# Patient Record
Sex: Male | Born: 1955
Health system: Southern US, Community
[De-identification: ages and names within clinical notes are randomized; demographics above are authoritative.]

## PROBLEM LIST (undated history)

## (undated) DIAGNOSIS — I639 Cerebral infarction, unspecified: Secondary | ICD-10-CM

## (undated) DIAGNOSIS — R569 Unspecified convulsions: Secondary | ICD-10-CM

## (undated) DIAGNOSIS — Z531 Procedure and treatment not carried out because of patient's decision for reasons of belief and group pressure: Secondary | ICD-10-CM

## (undated) DIAGNOSIS — I509 Heart failure, unspecified: Secondary | ICD-10-CM

## (undated) DIAGNOSIS — K219 Gastro-esophageal reflux disease without esophagitis: Secondary | ICD-10-CM

## (undated) DIAGNOSIS — IMO0001 Reserved for inherently not codable concepts without codable children: Secondary | ICD-10-CM

## (undated) DIAGNOSIS — E785 Hyperlipidemia, unspecified: Secondary | ICD-10-CM

## (undated) DIAGNOSIS — N179 Acute kidney failure, unspecified: Secondary | ICD-10-CM

## (undated) DIAGNOSIS — I219 Acute myocardial infarction, unspecified: Secondary | ICD-10-CM

## (undated) DIAGNOSIS — I2510 Atherosclerotic heart disease of native coronary artery without angina pectoris: Secondary | ICD-10-CM

## (undated) DIAGNOSIS — I10 Essential (primary) hypertension: Secondary | ICD-10-CM

## (undated) DIAGNOSIS — E119 Type 2 diabetes mellitus without complications: Secondary | ICD-10-CM

---

## 2005-01-12 ENCOUNTER — Ambulatory Visit: Payer: Self-pay | Admitting: Internal Medicine

## 2005-01-19 ENCOUNTER — Ambulatory Visit: Payer: Self-pay | Admitting: Internal Medicine

## 2005-12-20 ENCOUNTER — Ambulatory Visit: Payer: Self-pay | Admitting: Internal Medicine

## 2015-01-22 ENCOUNTER — Emergency Department (HOSPITAL_COMMUNITY): Payer: Medicaid Other

## 2015-01-22 ENCOUNTER — Inpatient Hospital Stay (HOSPITAL_COMMUNITY)
Admission: EM | Admit: 2015-01-22 | Discharge: 2015-02-01 | DRG: 981 | Disposition: A | Discharge status: Rehabilitation Facility | Payer: Medicaid Other | Attending: Internal Medicine | Admitting: Internal Medicine

## 2015-01-22 DIAGNOSIS — I10 Essential (primary) hypertension: Secondary | ICD-10-CM | POA: Diagnosis present

## 2015-01-22 DIAGNOSIS — E1165 Type 2 diabetes mellitus with hyperglycemia: Secondary | ICD-10-CM | POA: Diagnosis present

## 2015-01-22 DIAGNOSIS — F10239 Alcohol dependence with withdrawal, unspecified: Secondary | ICD-10-CM | POA: Diagnosis present

## 2015-01-22 DIAGNOSIS — R7989 Other specified abnormal findings of blood chemistry: Secondary | ICD-10-CM

## 2015-01-22 DIAGNOSIS — I252 Old myocardial infarction: Secondary | ICD-10-CM | POA: Diagnosis present

## 2015-01-22 DIAGNOSIS — I4892 Unspecified atrial flutter: Secondary | ICD-10-CM | POA: Diagnosis present

## 2015-01-22 DIAGNOSIS — R9431 Abnormal electrocardiogram [ECG] [EKG]: Secondary | ICD-10-CM

## 2015-01-22 DIAGNOSIS — R1310 Dysphagia, unspecified: Secondary | ICD-10-CM | POA: Diagnosis present

## 2015-01-22 DIAGNOSIS — I639 Cerebral infarction, unspecified: Secondary | ICD-10-CM

## 2015-01-22 DIAGNOSIS — J9600 Acute respiratory failure, unspecified whether with hypoxia or hypercapnia: Secondary | ICD-10-CM | POA: Diagnosis present

## 2015-01-22 DIAGNOSIS — R569 Unspecified convulsions: Secondary | ICD-10-CM | POA: Diagnosis present

## 2015-01-22 DIAGNOSIS — R778 Other specified abnormalities of plasma proteins: Secondary | ICD-10-CM

## 2015-01-22 DIAGNOSIS — I6340 Cerebral infarction due to embolism of unspecified cerebral artery: Principal | ICD-10-CM | POA: Diagnosis present

## 2015-01-22 DIAGNOSIS — E872 Acidosis, unspecified: Secondary | ICD-10-CM

## 2015-01-22 DIAGNOSIS — I2109 ST elevation (STEMI) myocardial infarction involving other coronary artery of anterior wall: Secondary | ICD-10-CM | POA: Diagnosis present

## 2015-01-22 DIAGNOSIS — R4182 Altered mental status, unspecified: Secondary | ICD-10-CM | POA: Diagnosis present

## 2015-01-22 DIAGNOSIS — E876 Hypokalemia: Secondary | ICD-10-CM | POA: Diagnosis present

## 2015-01-22 DIAGNOSIS — Z87891 Personal history of nicotine dependence: Secondary | ICD-10-CM

## 2015-01-22 DIAGNOSIS — I161 Hypertensive emergency: Secondary | ICD-10-CM | POA: Diagnosis present

## 2015-01-22 DIAGNOSIS — I214 Non-ST elevation (NSTEMI) myocardial infarction: Secondary | ICD-10-CM | POA: Diagnosis present

## 2015-01-22 DIAGNOSIS — E1142 Type 2 diabetes mellitus with diabetic polyneuropathy: Secondary | ICD-10-CM | POA: Diagnosis present

## 2015-01-22 DIAGNOSIS — I2510 Atherosclerotic heart disease of native coronary artery without angina pectoris: Secondary | ICD-10-CM | POA: Diagnosis present

## 2015-01-22 DIAGNOSIS — I219 Acute myocardial infarction, unspecified: Secondary | ICD-10-CM

## 2015-01-22 DIAGNOSIS — G9341 Metabolic encephalopathy: Secondary | ICD-10-CM | POA: Diagnosis present

## 2015-01-22 DIAGNOSIS — Z9289 Personal history of other medical treatment: Secondary | ICD-10-CM

## 2015-01-22 DIAGNOSIS — J9601 Acute respiratory failure with hypoxia: Secondary | ICD-10-CM | POA: Diagnosis present

## 2015-01-22 DIAGNOSIS — I2102 ST elevation (STEMI) myocardial infarction involving left anterior descending coronary artery: Secondary | ICD-10-CM | POA: Diagnosis present

## 2015-01-22 DIAGNOSIS — Z452 Encounter for adjustment and management of vascular access device: Secondary | ICD-10-CM

## 2015-01-22 DIAGNOSIS — J9690 Respiratory failure, unspecified, unspecified whether with hypoxia or hypercapnia: Secondary | ICD-10-CM

## 2015-01-22 DIAGNOSIS — I5043 Acute on chronic combined systolic (congestive) and diastolic (congestive) heart failure: Secondary | ICD-10-CM | POA: Diagnosis present

## 2015-01-22 DIAGNOSIS — Z4659 Encounter for fitting and adjustment of other gastrointestinal appliance and device: Secondary | ICD-10-CM

## 2015-01-22 DIAGNOSIS — N179 Acute kidney failure, unspecified: Secondary | ICD-10-CM | POA: Diagnosis present

## 2015-01-22 DIAGNOSIS — I272 Other secondary pulmonary hypertension: Secondary | ICD-10-CM | POA: Diagnosis present

## 2015-01-22 DIAGNOSIS — I5040 Unspecified combined systolic (congestive) and diastolic (congestive) heart failure: Secondary | ICD-10-CM | POA: Diagnosis present

## 2015-01-22 DIAGNOSIS — R739 Hyperglycemia, unspecified: Secondary | ICD-10-CM | POA: Diagnosis present

## 2015-01-22 DIAGNOSIS — E785 Hyperlipidemia, unspecified: Secondary | ICD-10-CM | POA: Diagnosis present

## 2015-01-22 HISTORY — DX: Cerebral infarction, unspecified: I63.9

## 2015-01-22 HISTORY — DX: Essential (primary) hypertension: I10

## 2015-01-22 HISTORY — DX: Atherosclerotic heart disease of native coronary artery without angina pectoris: I25.10

## 2015-01-22 HISTORY — DX: Acute myocardial infarction, unspecified: I21.9

## 2015-01-22 HISTORY — DX: Unspecified convulsions: R56.9

## 2015-01-22 LAB — CBC WITH DIFFERENTIAL/PLATELET
BASOS ABS: 0 10*3/uL (ref 0.0–0.1)
Basophils Relative: 0 % (ref 0–1)
Eosinophils Absolute: 0.4 10*3/uL (ref 0.0–0.7)
Eosinophils Relative: 2 % (ref 0–5)
HCT: 39.5 % (ref 39.0–52.0)
Hemoglobin: 14.2 g/dL (ref 13.0–17.0)
LYMPHS PCT: 20 % (ref 12–46)
Lymphs Abs: 3 10*3/uL (ref 0.7–4.0)
MCH: 31.1 pg (ref 26.0–34.0)
MCHC: 35.9 g/dL (ref 30.0–36.0)
MCV: 86.4 fL (ref 78.0–100.0)
MONO ABS: 0.8 10*3/uL (ref 0.1–1.0)
Monocytes Relative: 5 % (ref 3–12)
Neutro Abs: 11.1 10*3/uL — ABNORMAL HIGH (ref 1.7–7.7)
Neutrophils Relative %: 73 % (ref 43–77)
Platelets: 264 10*3/uL (ref 150–400)
RBC: 4.57 MIL/uL (ref 4.22–5.81)
RDW: 13 % (ref 11.5–15.5)
WBC: 15.3 10*3/uL — AB (ref 4.0–10.5)

## 2015-01-22 LAB — I-STAT CHEM 8, ED
BUN: 21 mg/dL — ABNORMAL HIGH (ref 6–20)
CALCIUM ION: 1.19 mmol/L (ref 1.12–1.23)
CHLORIDE: 97 mmol/L — AB (ref 101–111)
Creatinine, Ser: 1.4 mg/dL — ABNORMAL HIGH (ref 0.61–1.24)
GLUCOSE: 498 mg/dL — AB (ref 70–99)
HEMATOCRIT: 43 % (ref 39.0–52.0)
HEMOGLOBIN: 14.6 g/dL (ref 13.0–17.0)
Potassium: 2.8 mmol/L — ABNORMAL LOW (ref 3.5–5.1)
Sodium: 141 mmol/L (ref 135–145)
TCO2: 20 mmol/L (ref 0–100)

## 2015-01-22 LAB — COMPREHENSIVE METABOLIC PANEL
ALBUMIN: 4.6 g/dL (ref 3.5–5.0)
ALT: 20 U/L (ref 17–63)
AST: 34 U/L (ref 15–41)
Alkaline Phosphatase: 72 U/L (ref 38–126)
Anion gap: 19 — ABNORMAL HIGH (ref 5–15)
BUN: 22 mg/dL — ABNORMAL HIGH (ref 6–20)
CALCIUM: 9.3 mg/dL (ref 8.9–10.3)
CO2: 22 mmol/L (ref 22–32)
Chloride: 97 mmol/L — ABNORMAL LOW (ref 101–111)
Creatinine, Ser: 1.56 mg/dL — ABNORMAL HIGH (ref 0.61–1.24)
GFR calc Af Amer: 55 mL/min — ABNORMAL LOW (ref >60)
GFR calc non Af Amer: 47 mL/min — ABNORMAL LOW (ref >60)
Glucose, Bld: 487 mg/dL — ABNORMAL HIGH (ref 70–99)
Potassium: 2.8 mmol/L — ABNORMAL LOW (ref 3.5–5.1)
SODIUM: 138 mmol/L (ref 135–145)
TOTAL PROTEIN: 7.8 g/dL (ref 6.5–8.1)
Total Bilirubin: 0.6 mg/dL (ref 0.3–1.2)

## 2015-01-22 LAB — ACETAMINOPHEN LEVEL: Acetaminophen (Tylenol), Serum: <10 ug/mL — ABNORMAL LOW (ref 10–30)

## 2015-01-22 LAB — I-STAT CG4 LACTIC ACID, ED: Lactic Acid, Venous: 11.02 mmol/L (ref 0.5–2.0)

## 2015-01-22 LAB — ETHANOL

## 2015-01-22 LAB — CBG MONITORING, ED: Glucose-Capillary: 427 mg/dL — ABNORMAL HIGH (ref 70–99)

## 2015-01-22 LAB — TROPONIN I: Troponin I: 0.08 ng/mL — ABNORMAL HIGH (ref <0.031)

## 2015-01-22 MED ORDER — NICARDIPINE HCL IN NACL 20-0.86 MG/200ML-% IV SOLN
3.0000 mg/h | Freq: Once | INTRAVENOUS | Status: DC
  Filled 2015-01-22: qty 200

## 2015-01-22 MED ORDER — LEVETIRACETAM IN NACL 1000 MG/100ML IV SOLN
1000.0000 mg | Freq: Once | INTRAVENOUS | Status: AC
  Administered 2015-01-23: 1000 mg via INTRAVENOUS
  Filled 2015-01-22: qty 100

## 2015-01-22 MED ORDER — LIDOCAINE HCL (CARDIAC) 20 MG/ML IV SOLN
INTRAVENOUS | Status: AC
  Filled 2015-01-22: qty 5

## 2015-01-22 MED ORDER — ROCURONIUM BROMIDE 50 MG/5ML IV SOLN
INTRAVENOUS | Status: AC
  Administered 2015-01-22: 70 mg
  Filled 2015-01-22: qty 2

## 2015-01-22 MED ORDER — LEVETIRACETAM 500 MG/5ML IV SOLN
INTRAVENOUS | Status: AC
  Filled 2015-01-22: qty 10

## 2015-01-22 MED ORDER — LORAZEPAM 2 MG/ML IJ SOLN
2.0000 mg | Freq: Once | INTRAMUSCULAR | Status: AC
  Administered 2015-01-22: 2 mg via INTRAVENOUS
  Filled 2015-01-22: qty 1

## 2015-01-22 MED ORDER — SUCCINYLCHOLINE CHLORIDE 20 MG/ML IJ SOLN
INTRAMUSCULAR | Status: AC
  Filled 2015-01-22: qty 1

## 2015-01-22 MED ORDER — SODIUM CHLORIDE 0.9 % IV BOLUS (SEPSIS)
500.0000 mL | Freq: Once | INTRAVENOUS | Status: AC
  Administered 2015-01-23: 500 mL via INTRAVENOUS

## 2015-01-22 MED ORDER — ETOMIDATE 2 MG/ML IV SOLN
INTRAVENOUS | Status: AC
  Administered 2015-01-22: 20 mg via INTRAVENOUS
  Filled 2015-01-22: qty 20

## 2015-01-22 NOTE — ED Notes (Signed)
Per patient's wife last know normal around 740pm.  Pt fell out bed around 830 pm and was sweating.  EMS states patient was seizing when the came to house.  Blood sugar 421 mg/dl, hypertensive, incontinent, combative.

## 2015-01-22 NOTE — ED Provider Notes (Addendum)
CSN: [id number]     Arrival date & time 01/22/15  2231 History  This chart was scribed for Elnora Morrison, MD by Molli Posey, ED Scribe. This patient was seen in room APA02/APA02 and the patient's care was started 10:39 PM.   Chief Complaint  Patient presents with  . Seizures  . Hypertension  . Hyperglycemia   The history is provided by the spouse, the patient and the EMS personnel. No language interpreter was used.   LEVEL 5 CAVEAT - ALTERED MENTAL STATUS   HPI Comments: Timothy Lambert is a 59 y.o. male with a history of DM who presents to the Emergency Department complaining of a seizure PTA. EMS states that pt's blood sugar was around 430 and pt had a BP of 123456 systolic on EMT arrival. They report no trauma. His wife reports that pt is a daily alcohol drinker and says the last time she saw him drank was last night. Wife reports no prior hx of alcohol withdrawal seizures. Prior to 6:40PM his wife says that pt was normal, talking and walking normally. Wife reports that she heard a loud noise shortly after 8PM. She says that pt has been taking his blood pressure medication as far as she knows. Wife reports no hx of heart problems. She denies CP.   No past medical history on file. No past surgical history on file. No family history on file. History  Substance Use Topics  . Smoking status: Not on file  . Smokeless tobacco: Not on file  . Alcohol Use: Not on file    Review of Systems  Unable to perform ROS: Mental status change    Allergies  Review of patient's allergies indicates no known allergies.  Home Medications   Prior to Admission medications   Not on File   BP 167/102 mmHg  Pulse 141  Temp(Src) 97.7 F (36.5 C) (Oral)  Resp 34  Ht 5\' 6"  (1.676 m)  Wt 152 lb (68.947 kg)  BMI 24.55 kg/m2  SpO2 92% Physical Exam  Constitutional: He appears well-developed and well-nourished.  HENT:  Head: Normocephalic.  Eyes: Pupils are equal, round, and reactive to light.   Pupils 65mm    Neck: No tracheal deviation present.  Cardiovascular: Tachycardia present.   Pulmonary/Chest: He has no wheezes. He has no rales.  Abdominal: There is no guarding.  Musculoskeletal: He exhibits no edema.  Neurological:  Difficult neuro exam because pt is combative and altered. Slurring speech. Moving all extremities equal. 5+ strength in upper extremities. Decreased strength in the left leg grossly compared to other extremities. Moaning, mumbling, incoherent.  PERRL No meningismus  Skin: Skin is warm. Rash: diaphoretic. He is diaphoretic.  Superficial laceration to right medial foot.   Psychiatric:  Combative, altered  Nursing note and vitals reviewed.   ED Course  Procedures   CRITICAL CARE Performed by: Mariea Clonts   Total critical care time: 75 min  Critical care time was exclusive of separately billable procedures and treating other patients.  Critical care was necessary to treat or prevent imminent or life-threatening deterioration.  Critical care was time spent personally by me on the following activities: development of treatment plan with patient and/or surrogate as well as nursing, discussions with consultants, evaluation of patient's response to treatment, examination of patient, obtaining history from patient or surrogate, ordering and performing treatments and interventions, ordering and review of laboratory studies, ordering and review of radiographic studies, pulse oximetry and re-evaluation of patient's condition.  DIAGNOSTIC STUDIES:  Oxygen Saturation is 92% on RA, adequate by my interpretation.    COORDINATION OF CARE: 10:45 PM Discussed treatment plan with pt at bedside and pt agreed to plan.  INTUBATION Performed by: Mariea Clonts  Required items: required blood products, implants, devices, and special equipment available Patient identity confirmed: provided demographic data and hospital-assigned identification number Time out:  Immediately prior to procedure a "time out" was called to verify the correct patient, procedure, equipment, support staff.  Indications: altered, combative, protection Intubation method: direct Preoxygenation: BVM Sedatives: Etomidate Paralytic: Rocuronium Tube Size: 7.5 cuffed  Post-procedure assessment: chest rise and ETCO2 monitor Breath sounds: equal and absent over the epigastrium Tube secured with: ETT holder Chest x-ray interpreted by me.  Chest x-ray findings: endotracheal tube in appropriate position  Patient tolerated the procedure well with no immediate complications.  Labs Review Labs Reviewed  CBC WITH DIFFERENTIAL/PLATELET - Abnormal; Notable for the following:    WBC 15.3 (*)    Neutro Abs 11.1 (*)    All other components within normal limits  TROPONIN I - Abnormal; Notable for the following:    Troponin I 0.08 (*)    All other components within normal limits  COMPREHENSIVE METABOLIC PANEL - Abnormal; Notable for the following:    Potassium 2.8 (*)    Chloride 97 (*)    Glucose, Bld 487 (*)    BUN 22 (*)    Creatinine, Ser 1.56 (*)    GFR calc non Af Amer 47 (*)    GFR calc Af Amer 55 (*)    Anion gap 19 (*)    All other components within normal limits  URINALYSIS, ROUTINE W REFLEX MICROSCOPIC - Abnormal; Notable for the following:    Color, Urine STRAW (*)    APPearance HAZY (*)    Glucose, UA >1000 (*)    Hgb urine dipstick MODERATE (*)    Protein, ur 100 (*)    Nitrite POSITIVE (*)    All other components within normal limits  URINE RAPID DRUG SCREEN (HOSP PERFORMED) - Abnormal; Notable for the following:    Benzodiazepines POSITIVE (*)    All other components within normal limits  ACETAMINOPHEN LEVEL - Abnormal; Notable for the following:    Acetaminophen (Tylenol), Serum <10 (*)    All other components within normal limits  BLOOD GAS, ARTERIAL - Abnormal; Notable for the following:    pH, Arterial 7.301 (*)    pCO2 arterial 56.6 (*)    pO2,  Arterial 159.0 (*)    Bicarbonate 27.1 (*)    Allens test (pass/fail) NOT INDICATED (*)    All other components within normal limits  URINE MICROSCOPIC-ADD ON - Abnormal; Notable for the following:    Bacteria, UA MANY (*)    All other components within normal limits  I-STAT CHEM 8, ED - Abnormal; Notable for the following:    Potassium 2.8 (*)    Chloride 97 (*)    BUN 21 (*)    Creatinine, Ser 1.40 (*)    Glucose, Bld 498 (*)    All other components within normal limits  I-STAT CG4 LACTIC ACID, ED - Abnormal; Notable for the following:    Lactic Acid, Venous 11.02 (*)    All other components within normal limits  CBG MONITORING, ED - Abnormal; Notable for the following:    Glucose-Capillary 427 (*)    All other components within normal limits  ETHANOL    Imaging Review Ct Head Wo Contrast  01/23/2015  CLINICAL DATA:  Altered mental status  EXAM: CT HEAD WITHOUT CONTRAST  TECHNIQUE: Contiguous axial images were obtained from the base of the skull through the vertex without intravenous contrast.  COMPARISON:  None.  FINDINGS: There is no intracranial hemorrhage, mass or evidence of acute infarction. There is moderate generalized atrophy. There is mild chronic microvascular ischemic change. There is no significant extra-axial fluid collection.  No acute intracranial findings are evident.  IMPRESSION: Moderate chronic atrophy and mild chronic small vessel changes. No acute intracranial findings. Mild motion degradation of the images.   Electronically Signed   By: Andreas Newport M.D.   On: 01/23/2015 00:33   Dg Chest Port 1 View  01/23/2015   CLINICAL DATA:  Seizure  EXAM: PORTABLE CHEST - 1 VIEW  COMPARISON:  None.  FINDINGS: Endotracheal tube 2.8 cm from carina. Normal cardiac silhouette. There is a left lower lobe opacity posterior to the heart. No pleural fluid. No pneumothorax. No overt pulmonary edema.  IMPRESSION: 1. Endotracheal tube in good position. 2. Left lower lobe atelectasis  versus infiltrate.   Electronically Signed   By: Suzy Bouchard M.D.   On: 01/23/2015 00:02     EKG Interpretation   Date/Time:  Tuesday Jan 22 2015 23:02:18 EDT Ventricular Rate:  128 PR Interval:  167 QRS Duration: 78 QT Interval:  305 QTC Calculation: 445 R Axis:   80 Text Interpretation:  Sinus tachycardia LAE, consider biatrial enlargement  Inferior infarct, acute Anterior infarct, acute (LAD) Confirmed by Troy Kanouse   MD, Roxy Mastandrea ([id number]) on 01/22/2015 11:10:06 PM     Repeat EKG heart rate 140 sinus tachycardia, PVCs, prolonged QT, significant elevation V2 V3 without reciprocal depression MDM   Final diagnoses:  Altered mental status  Altered mental state  Abnormal EKG  Troponin level elevated  Hypertensive emergency  Seizure  Hypokalemia  Lactic acidosis  Acute renal failure, unspecified acute renal failure type  Hyperglycemia   Patient presented altered mental status after 2 witnessed seizures. Patient combative on route and combative on arrival to the ER. Repeat Ativan 2 mg dosing given twice, minimal improvement in mental status. Patient moving all extremities with 5+ strength in combative except mild decreased strength in the left lower extremity. Difficult exam due to combative and altered mental status. Patient's wife arrived to help provide more details and help with workup of the patient. EKG done shortly after arrival showed concern for cardiac ischemia. Unable to get details from the patient however wife says he had no concerns today or complaints of chest pain or shortness of breath. Discussed with Dr. Burt Knack cardiologist on call and due to presentation and current clinical status unable to bring patient to the Cath Lab emergently however they will follow closely once patient is admitted to the ICU at [facility].  Patient intubated for both airway protection and to allow further workup, discussed with the wife was okay with this plan.  CT head pending. Differential  diagnosis includes hypertensive emergency from uncontrolled high blood pressure, withdrawal seizures from alcohol abuse, CNS bleed, cardiac, stroke, other.  Patient critical in ER, drips ordered. Once CT scan results confirmed will discussed with critical care for transfer.  Discussed with Dr. Burt Knack for update, ST segments remained elevated, patient still tachycardic and hypertensive. CT scan results no acute findings no bleeding. Discussed heparin drip, aspirin. Discussed with critical care Dr.Simonds who agreed with transfer to [facility] ICU, heparin drip, metoprolol and nitro drip.    The patients results and plan were reviewed  and discussed.   Any x-rays performed were personally reviewed by myself.   Differential diagnosis were considered with the presenting HPI.  Medications  lidocaine (cardiac) 100 mg/72ml (XYLOCAINE) 20 MG/ML injection 2% (not administered)  succinylcholine (ANECTINE) 20 MG/ML injection (not administered)  rocuronium (ZEMURON) 50 MG/5ML injection (not administered)  etomidate (AMIDATE) 2 MG/ML injection (not administered)  potassium chloride 10 mEq in 100 mL IVPB (not administered)  potassium chloride 10 mEq in 100 mL IVPB (not administered)  fentaNYL (SUBLIMAZE) 2,500 mcg in sodium chloride 0.9 % 250 mL (10 mcg/mL) infusion (not administered)  midazolam (VERSED) 50 mg in sodium chloride 0.9 % 50 mL (1 mg/mL) infusion (not administered)  heparin injection 4,000 Units (not administered)  aspirin suppository 300 mg (not administered)  nitroGLYCERIN 50 mg in dextrose 5 % 250 mL (0.2 mg/mL) infusion (not administered)  metoprolol (LOPRESSOR) injection 5 mg (not administered)  0.9 %  sodium chloride infusion (not administered)  LORazepam (ATIVAN) injection 2 mg (2 mg Intravenous Given 01/22/15 2253)  sodium chloride 0.9 % bolus 500 mL (0 mLs Intravenous Stopped 01/23/15 0103)  levETIRAcetam (KEPPRA) IVPB 1000 mg/100 mL premix (1,000 mg Intravenous Given 01/23/15 0039)  LORazepam  (ATIVAN) injection 2 mg (2 mg Intravenous Given 01/22/15 2316)  LORazepam (ATIVAN) injection 2 mg (2 mg Intravenous Given 01/23/15 0010)  fentaNYL (SUBLIMAZE) injection 100 mcg (100 mcg Intravenous Given 01/23/15 0038)    Filed Vitals:   01/22/15 2324 01/22/15 2336  BP:  167/102  Pulse:  141  Temp:  97.7 F (36.5 C)  TempSrc:  Oral  Resp:  34  Height: 5\' 6"  (1.676 m)   Weight: 152 lb (68.947 kg)   SpO2:  92%    Final diagnoses:  Altered mental status  Altered mental state  Abnormal EKG  Troponin level elevated  Hypertensive emergency  Seizure  Hypokalemia  Lactic acidosis  Acute renal failure, unspecified acute renal failure type  Hyperglycemia    Admission/ observation were discussed with the admitting physician, patient and/or family and they are comfortable with the plan.       Elnora Morrison, MD 01/23/15 [id number]  Elnora Morrison, MD 01/23/15 [phone]

## 2015-01-23 ENCOUNTER — Encounter (HOSPITAL_COMMUNITY)
Admission: EM | Disposition: A | Discharge status: Rehabilitation Facility | Payer: Self-pay | Source: Home / Self Care | Attending: Pulmonary Disease

## 2015-01-23 ENCOUNTER — Inpatient Hospital Stay (HOSPITAL_COMMUNITY): Payer: Medicaid Other

## 2015-01-23 ENCOUNTER — Encounter (HOSPITAL_COMMUNITY): Payer: Self-pay | Admitting: *Deleted

## 2015-01-23 DIAGNOSIS — I2510 Atherosclerotic heart disease of native coronary artery without angina pectoris: Secondary | ICD-10-CM | POA: Diagnosis not present

## 2015-01-23 DIAGNOSIS — I2102 ST elevation (STEMI) myocardial infarction involving left anterior descending coronary artery: Secondary | ICD-10-CM

## 2015-01-23 DIAGNOSIS — E1165 Type 2 diabetes mellitus with hyperglycemia: Secondary | ICD-10-CM | POA: Diagnosis present

## 2015-01-23 DIAGNOSIS — E875 Hyperkalemia: Secondary | ICD-10-CM | POA: Diagnosis not present

## 2015-01-23 DIAGNOSIS — R400 Somnolence: Secondary | ICD-10-CM | POA: Diagnosis not present

## 2015-01-23 DIAGNOSIS — N179 Acute kidney failure, unspecified: Secondary | ICD-10-CM

## 2015-01-23 DIAGNOSIS — I272 Other secondary pulmonary hypertension: Secondary | ICD-10-CM | POA: Diagnosis not present

## 2015-01-23 DIAGNOSIS — R569 Unspecified convulsions: Secondary | ICD-10-CM | POA: Diagnosis not present

## 2015-01-23 DIAGNOSIS — I10 Essential (primary) hypertension: Secondary | ICD-10-CM | POA: Diagnosis not present

## 2015-01-23 DIAGNOSIS — R401 Stupor: Secondary | ICD-10-CM | POA: Diagnosis not present

## 2015-01-23 DIAGNOSIS — I639 Cerebral infarction, unspecified: Secondary | ICD-10-CM | POA: Diagnosis not present

## 2015-01-23 DIAGNOSIS — I161 Hypertensive emergency: Secondary | ICD-10-CM | POA: Diagnosis present

## 2015-01-23 DIAGNOSIS — E872 Acidosis: Secondary | ICD-10-CM | POA: Diagnosis not present

## 2015-01-23 DIAGNOSIS — R410 Disorientation, unspecified: Secondary | ICD-10-CM | POA: Diagnosis not present

## 2015-01-23 DIAGNOSIS — I6319 Cerebral infarction due to embolism of other precerebral artery: Secondary | ICD-10-CM | POA: Diagnosis not present

## 2015-01-23 DIAGNOSIS — G9341 Metabolic encephalopathy: Secondary | ICD-10-CM | POA: Diagnosis present

## 2015-01-23 DIAGNOSIS — I6340 Cerebral infarction due to embolism of unspecified cerebral artery: Secondary | ICD-10-CM | POA: Diagnosis present

## 2015-01-23 DIAGNOSIS — E1142 Type 2 diabetes mellitus with diabetic polyneuropathy: Secondary | ICD-10-CM | POA: Diagnosis not present

## 2015-01-23 DIAGNOSIS — I2109 ST elevation (STEMI) myocardial infarction involving other coronary artery of anterior wall: Secondary | ICD-10-CM | POA: Diagnosis present

## 2015-01-23 DIAGNOSIS — R4182 Altered mental status, unspecified: Secondary | ICD-10-CM | POA: Diagnosis present

## 2015-01-23 DIAGNOSIS — I213 ST elevation (STEMI) myocardial infarction of unspecified site: Secondary | ICD-10-CM | POA: Diagnosis not present

## 2015-01-23 DIAGNOSIS — E876 Hypokalemia: Secondary | ICD-10-CM | POA: Diagnosis not present

## 2015-01-23 DIAGNOSIS — F10239 Alcohol dependence with withdrawal, unspecified: Secondary | ICD-10-CM | POA: Diagnosis not present

## 2015-01-23 DIAGNOSIS — R1310 Dysphagia, unspecified: Secondary | ICD-10-CM | POA: Diagnosis present

## 2015-01-23 DIAGNOSIS — Z87891 Personal history of nicotine dependence: Secondary | ICD-10-CM | POA: Diagnosis not present

## 2015-01-23 DIAGNOSIS — R739 Hyperglycemia, unspecified: Secondary | ICD-10-CM | POA: Diagnosis not present

## 2015-01-23 DIAGNOSIS — I69391 Dysphagia following cerebral infarction: Secondary | ICD-10-CM | POA: Diagnosis not present

## 2015-01-23 DIAGNOSIS — I5043 Acute on chronic combined systolic (congestive) and diastolic (congestive) heart failure: Secondary | ICD-10-CM | POA: Diagnosis not present

## 2015-01-23 DIAGNOSIS — J9601 Acute respiratory failure with hypoxia: Secondary | ICD-10-CM | POA: Diagnosis not present

## 2015-01-23 DIAGNOSIS — I252 Old myocardial infarction: Secondary | ICD-10-CM | POA: Diagnosis present

## 2015-01-23 DIAGNOSIS — E785 Hyperlipidemia, unspecified: Secondary | ICD-10-CM | POA: Diagnosis not present

## 2015-01-23 DIAGNOSIS — I4892 Unspecified atrial flutter: Secondary | ICD-10-CM | POA: Diagnosis not present

## 2015-01-23 DIAGNOSIS — I5041 Acute combined systolic (congestive) and diastolic (congestive) heart failure: Secondary | ICD-10-CM | POA: Diagnosis not present

## 2015-01-23 HISTORY — PX: CARDIAC CATHETERIZATION: SHX172

## 2015-01-23 LAB — RAPID URINE DRUG SCREEN, HOSP PERFORMED
Amphetamines: NOT DETECTED
BENZODIAZEPINES: POSITIVE — AB
Barbiturates: NOT DETECTED
COCAINE: NOT DETECTED
Opiates: NOT DETECTED
Tetrahydrocannabinol: NOT DETECTED

## 2015-01-23 LAB — URINALYSIS, ROUTINE W REFLEX MICROSCOPIC
BILIRUBIN URINE: NEGATIVE
Glucose, UA: >1000 mg/dL — AB
Ketones, ur: NEGATIVE mg/dL
Leukocytes, UA: NEGATIVE
NITRITE: POSITIVE — AB
Protein, ur: 100 mg/dL — AB
SPECIFIC GRAVITY, URINE: 1.02 (ref 1.005–1.030)
Urobilinogen, UA: 0.2 mg/dL (ref 0.0–1.0)
pH: 6 (ref 5.0–8.0)

## 2015-01-23 LAB — GLUCOSE, CAPILLARY
GLUCOSE-CAPILLARY: 352 mg/dL — AB (ref 70–99)
GLUCOSE-CAPILLARY: 135 mg/dL — AB (ref 70–99)
GLUCOSE-CAPILLARY: 165 mg/dL — AB (ref 70–99)
GLUCOSE-CAPILLARY: 171 mg/dL — AB (ref 70–99)
Glucose-Capillary: 426 mg/dL — ABNORMAL HIGH (ref 70–99)
Glucose-Capillary: 344 mg/dL — ABNORMAL HIGH (ref 70–99)
Glucose-Capillary: 262 mg/dL — ABNORMAL HIGH (ref 70–99)
Glucose-Capillary: 225 mg/dL — ABNORMAL HIGH (ref 70–99)
Glucose-Capillary: 113 mg/dL — ABNORMAL HIGH (ref 70–99)
Glucose-Capillary: 98 mg/dL (ref 70–99)
Glucose-Capillary: 153 mg/dL — ABNORMAL HIGH (ref 70–99)
Glucose-Capillary: 196 mg/dL — ABNORMAL HIGH (ref 70–99)
Glucose-Capillary: 198 mg/dL — ABNORMAL HIGH (ref 70–99)
Glucose-Capillary: 167 mg/dL — ABNORMAL HIGH (ref 70–99)
Glucose-Capillary: 149 mg/dL — ABNORMAL HIGH (ref 70–99)
Glucose-Capillary: 141 mg/dL — ABNORMAL HIGH (ref 70–99)

## 2015-01-23 LAB — COMPREHENSIVE METABOLIC PANEL
ALT: 44 U/L (ref 17–63)
AST: 274 U/L — ABNORMAL HIGH (ref 15–41)
Albumin: 3.4 g/dL — ABNORMAL LOW (ref 3.5–5.0)
Alkaline Phosphatase: 50 U/L (ref 38–126)
Anion gap: 11 (ref 5–15)
BUN: 23 mg/dL — ABNORMAL HIGH (ref 6–20)
CALCIUM: 8.3 mg/dL — AB (ref 8.9–10.3)
CO2: 26 mmol/L (ref 22–32)
CREATININE: 1.66 mg/dL — AB (ref 0.61–1.24)
Chloride: 101 mmol/L (ref 101–111)
GFR calc Af Amer: 51 mL/min — ABNORMAL LOW (ref >60)
GFR, EST NON AFRICAN AMERICAN: 44 mL/min — AB (ref >60)
GLUCOSE: 478 mg/dL — AB (ref 70–99)
Potassium: 4.1 mmol/L (ref 3.5–5.1)
Sodium: 138 mmol/L (ref 135–145)
Total Bilirubin: 0.8 mg/dL (ref 0.3–1.2)
Total Protein: 5.9 g/dL — ABNORMAL LOW (ref 6.5–8.1)

## 2015-01-23 LAB — BLOOD GAS, ARTERIAL
Acid-Base Excess: 1.4 mmol/L (ref 0.0–2.0)
Bicarbonate: 27.1 mEq/L — ABNORMAL HIGH (ref 20.0–24.0)
Drawn by: 21310
FIO2: 100 %
LHR: 14 {breaths}/min
MECHVT: 510 mL
O2 SAT: 98.6 %
PEEP/CPAP: 5 cmH2O
PO2 ART: 159 mmHg — AB (ref 80.0–100.0)
TCO2: 24.3 mmol/L (ref 0–100)
pCO2 arterial: 56.6 mmHg — ABNORMAL HIGH (ref 35.0–45.0)
pH, Arterial: 7.301 — ABNORMAL LOW (ref 7.350–7.450)

## 2015-01-23 LAB — CBC
HCT: 31.2 % — ABNORMAL LOW (ref 39.0–52.0)
Hemoglobin: 11.3 g/dL — ABNORMAL LOW (ref 13.0–17.0)
MCH: 30.8 pg (ref 26.0–34.0)
MCHC: 36.2 g/dL — ABNORMAL HIGH (ref 30.0–36.0)
MCV: 85 fL (ref 78.0–100.0)
Platelets: 209 10*3/uL (ref 150–400)
RBC: 3.67 MIL/uL — AB (ref 4.22–5.81)
RDW: 13.2 % (ref 11.5–15.5)
WBC: 13.4 10*3/uL — AB (ref 4.0–10.5)

## 2015-01-23 LAB — TRIGLYCERIDES: Triglycerides: 99 mg/dL (ref <150)

## 2015-01-23 LAB — LACTIC ACID, PLASMA
Lactic Acid, Venous: 2.3 mmol/L (ref 0.5–2.0)
Lactic Acid, Venous: 2.9 mmol/L (ref 0.5–2.0)

## 2015-01-23 LAB — URINE MICROSCOPIC-ADD ON

## 2015-01-23 LAB — TROPONIN I

## 2015-01-23 LAB — MRSA PCR SCREENING: MRSA by PCR: NEGATIVE

## 2015-01-23 LAB — POCT ACTIVATED CLOTTING TIME: ACTIVATED CLOTTING TIME: 479 s

## 2015-01-23 SURGERY — LEFT HEART CATH AND CORONARY ANGIOGRAPHY
Anesthesia: LOCAL

## 2015-01-23 SURGERY — LEFT HEART CATHETERIZATION WITH CORONARY ANGIOGRAM
Anesthesia: LOCAL

## 2015-01-23 MED ORDER — MIDAZOLAM HCL 50 MG/10ML IJ SOLN
INTRAMUSCULAR | Status: AC
  Filled 2015-01-23: qty 1

## 2015-01-23 MED ORDER — ACETAMINOPHEN 325 MG PO TABS
650.0000 mg | ORAL_TABLET | ORAL | Status: DC | PRN
  Administered 2015-01-26 – 2015-01-27 (×2): 650 mg
  Filled 2015-01-23 (×3): qty 2

## 2015-01-23 MED ORDER — SODIUM CHLORIDE 0.9 % WEIGHT BASED INFUSION: 1.0000 mL/kg/h | INTRAVENOUS | Status: AC

## 2015-01-23 MED ORDER — DEXTROSE 50 % IV SOLN: 25.0000 mL | INTRAVENOUS | Status: DC | PRN

## 2015-01-23 MED ORDER — NITROGLYCERIN 1 MG/10 ML FOR IR/CATH LAB
INTRA_ARTERIAL | Status: AC
  Filled 2015-01-23: qty 10

## 2015-01-23 MED ORDER — HEPARIN (PORCINE) IN NACL 2-0.9 UNIT/ML-% IJ SOLN
INTRAMUSCULAR | Status: AC
  Filled 2015-01-23: qty 1000

## 2015-01-23 MED ORDER — SODIUM CHLORIDE 0.9 % IJ SOLN
3.0000 mL | Freq: Two times a day (BID) | INTRAMUSCULAR | Status: DC
  Administered 2015-01-23: 3 mL via INTRAVENOUS

## 2015-01-23 MED ORDER — TICAGRELOR 90 MG PO TABS
90.0000 mg | ORAL_TABLET | Freq: Two times a day (BID) | ORAL | Status: DC
  Administered 2015-01-23 – 2015-01-29 (×12): 90 mg via ORAL
  Filled 2015-01-23 (×13): qty 1

## 2015-01-23 MED ORDER — HEPARIN (PORCINE) IN NACL 100-0.45 UNIT/ML-% IJ SOLN
950.0000 [IU]/h | INTRAMUSCULAR | Status: DC
  Administered 2015-01-23: 950 [IU]/h via INTRAVENOUS
  Filled 2015-01-23: qty 250

## 2015-01-23 MED ORDER — METOPROLOL TARTRATE 1 MG/ML IV SOLN
5.0000 mg | Freq: Once | INTRAVENOUS | Status: AC
  Administered 2015-01-23: 5 mg via INTRAVENOUS
  Filled 2015-01-23: qty 5

## 2015-01-23 MED ORDER — SODIUM CHLORIDE 0.9 % IV SOLN: 250.0000 mL | INTRAVENOUS | Status: DC | PRN

## 2015-01-23 MED ORDER — SODIUM CHLORIDE 0.9 % IV SOLN
4.0000 ug/kg/min | INTRAVENOUS | Status: AC
  Filled 2015-01-23: qty 50

## 2015-01-23 MED ORDER — PNEUMOCOCCAL VAC POLYVALENT 25 MCG/0.5ML IJ INJ
0.5000 mL | INJECTION | INTRAMUSCULAR | Status: AC
  Administered 2015-01-24: 0.5 mL via INTRAMUSCULAR
  Filled 2015-01-23 (×2): qty 0.5

## 2015-01-23 MED ORDER — SODIUM CHLORIDE 0.9 % IV SOLN
1.0000 mg/h | INTRAVENOUS | Status: DC
  Administered 2015-01-23: 1 mg/h via INTRAVENOUS
  Filled 2015-01-23: qty 10

## 2015-01-23 MED ORDER — BIVALIRUDIN BOLUS VIA INFUSION
INTRAVENOUS | Status: DC | PRN
  Administered 2015-01-23: 7.13 mg via INTRAVENOUS

## 2015-01-23 MED ORDER — SODIUM CHLORIDE 0.9 % IV SOLN
INTRAVENOUS | Status: DC
  Filled 2015-01-23: qty 2.5

## 2015-01-23 MED ORDER — POTASSIUM CHLORIDE 10 MEQ/100ML IV SOLN: 10.0000 meq | Freq: Once | INTRAVENOUS | Status: DC

## 2015-01-23 MED ORDER — PROPOFOL 1000 MG/100ML IV EMUL
0.0000 ug/kg/min | INTRAVENOUS | Status: DC
  Administered 2015-01-23: 20 ug/kg/min via INTRAVENOUS
  Administered 2015-01-24: 30 ug/kg/min via INTRAVENOUS
  Administered 2015-01-24: 40 ug/kg/min via INTRAVENOUS
  Filled 2015-01-23 (×5): qty 100

## 2015-01-23 MED ORDER — POTASSIUM CHLORIDE 2 MEQ/ML IV SOLN
INTRAVENOUS | Status: DC
  Administered 2015-01-23: 05:00:00 via INTRAVENOUS
  Filled 2015-01-23: qty 1000

## 2015-01-23 MED ORDER — NITROGLYCERIN 0.2 MG/ML ON CALL CATH LAB
INTRAVENOUS | Status: DC | PRN
  Administered 2015-01-23: 150 ug via INTRAVENOUS
  Administered 2015-01-23: 200 ug via INTRAVENOUS

## 2015-01-23 MED ORDER — ASPIRIN 300 MG RE SUPP
300.0000 mg | Freq: Once | RECTAL | Status: AC
  Administered 2015-01-23: 300 mg via RECTAL
  Filled 2015-01-23: qty 1

## 2015-01-23 MED ORDER — LORAZEPAM 2 MG/ML IJ SOLN: 2.0000 mg | INTRAMUSCULAR | Status: DC | PRN

## 2015-01-23 MED ORDER — ASPIRIN 325 MG PO TABS: 325.0000 mg | ORAL_TABLET | Freq: Every day | ORAL | Status: DC

## 2015-01-23 MED ORDER — METOPROLOL TARTRATE 1 MG/ML IV SOLN
5.0000 mg | Freq: Four times a day (QID) | INTRAVENOUS | Status: DC
  Administered 2015-01-23 – 2015-01-24 (×3): 5 mg via INTRAVENOUS
  Filled 2015-01-23 (×7): qty 5

## 2015-01-23 MED ORDER — INSULIN ASPART 100 UNIT/ML ~~LOC~~ SOLN
2.0000 [IU] | SUBCUTANEOUS | Status: DC
  Administered 2015-01-23 – 2015-01-24 (×3): 2 [IU] via SUBCUTANEOUS
  Administered 2015-01-24 (×2): 4 [IU] via SUBCUTANEOUS
  Administered 2015-01-25 (×2): 2 [IU] via SUBCUTANEOUS
  Administered 2015-01-26: 6 [IU] via SUBCUTANEOUS
  Administered 2015-01-26: 2 [IU] via SUBCUTANEOUS
  Administered 2015-01-26 (×2): 4 [IU] via SUBCUTANEOUS

## 2015-01-23 MED ORDER — HEPARIN SODIUM (PORCINE) 5000 UNIT/ML IJ SOLN
4000.0000 [IU] | Freq: Once | INTRAMUSCULAR | Status: AC
  Administered 2015-01-23: 4000 [IU] via INTRAVENOUS

## 2015-01-23 MED ORDER — THIAMINE HCL 100 MG/ML IJ SOLN
100.0000 mg | Freq: Every day | INTRAMUSCULAR | Status: DC
  Administered 2015-01-23: 100 mg via INTRAVENOUS
  Filled 2015-01-23: qty 1

## 2015-01-23 MED ORDER — BIVALIRUDIN 250 MG IV SOLR
INTRAVENOUS | Status: AC
  Filled 2015-01-23: qty 250

## 2015-01-23 MED ORDER — VERAPAMIL HCL 2.5 MG/ML IV SOLN
INTRAVENOUS | Status: AC
  Filled 2015-01-23: qty 2

## 2015-01-23 MED ORDER — FAMOTIDINE IN NACL 20-0.9 MG/50ML-% IV SOLN
20.0000 mg | Freq: Two times a day (BID) | INTRAVENOUS | Status: DC
  Administered 2015-01-23: 20 mg via INTRAVENOUS
  Filled 2015-01-23 (×2): qty 50

## 2015-01-23 MED ORDER — SODIUM CHLORIDE 0.9 % IV SOLN: INTRAVENOUS | Status: DC

## 2015-01-23 MED ORDER — POTASSIUM CHLORIDE 10 MEQ/100ML IV SOLN
10.0000 meq | Freq: Once | INTRAVENOUS | Status: AC
Start: 2015-01-23 — End: 2015-01-23
  Administered 2015-01-23: 10 meq via INTRAVENOUS
  Filled 2015-01-23: qty 100

## 2015-01-23 MED ORDER — MIDAZOLAM HCL 2 MG/2ML IJ SOLN
2.0000 mg | INTRAMUSCULAR | Status: DC | PRN
  Administered 2015-01-24 – 2015-01-30 (×3): 2 mg via INTRAVENOUS
  Filled 2015-01-23 (×3): qty 2

## 2015-01-23 MED ORDER — SODIUM CHLORIDE 0.9 % IV SOLN
40.0000 ug/h | INTRAVENOUS | Status: DC
  Administered 2015-01-23: 40 ug/h via INTRAVENOUS
  Filled 2015-01-23: qty 50

## 2015-01-23 MED ORDER — LORAZEPAM 2 MG/ML IJ SOLN
2.0000 mg | Freq: Once | INTRAMUSCULAR | Status: AC
  Administered 2015-01-23: 2 mg via INTRAVENOUS
  Filled 2015-01-23: qty 1

## 2015-01-23 MED ORDER — VITAMIN B-1 100 MG PO TABS
100.0000 mg | ORAL_TABLET | Freq: Every day | ORAL | Status: DC
  Administered 2015-01-24 – 2015-02-01 (×9): 100 mg
  Filled 2015-01-23 (×9): qty 1

## 2015-01-23 MED ORDER — DEXTROSE 10 % IV SOLN: INTRAVENOUS | Status: DC | PRN

## 2015-01-23 MED ORDER — FENTANYL CITRATE (PF) 100 MCG/2ML IJ SOLN
100.0000 ug | Freq: Once | INTRAMUSCULAR | Status: AC
  Administered 2015-01-23: 100 ug via INTRAVENOUS
  Filled 2015-01-23: qty 2

## 2015-01-23 MED ORDER — FENTANYL CITRATE (PF) 2500 MCG/50ML IJ SOLN
INTRAMUSCULAR | Status: AC
  Filled 2015-01-23: qty 50

## 2015-01-23 MED ORDER — POTASSIUM CHLORIDE 10 MEQ/100ML IV SOLN
10.0000 meq | INTRAVENOUS | Status: AC
  Administered 2015-01-23 (×2): 10 meq via INTRAVENOUS
  Filled 2015-01-23: qty 100

## 2015-01-23 MED ORDER — VERAPAMIL HCL 2.5 MG/ML IV SOLN
INTRAVENOUS | Status: DC | PRN
  Administered 2015-01-23: 06:00:00 via INTRA_ARTERIAL

## 2015-01-23 MED ORDER — BIVALIRUDIN 250 MG IV SOLR
250.0000 mg | INTRAVENOUS | Status: DC | PRN
  Administered 2015-01-23: 1 mg/kg/h via INTRAVENOUS

## 2015-01-23 MED ORDER — ASPIRIN 81 MG PO CHEW
81.0000 mg | CHEWABLE_TABLET | Freq: Every day | ORAL | Status: DC
  Administered 2015-01-23 – 2015-01-29 (×7): 81 mg via ORAL
  Filled 2015-01-23 (×7): qty 1

## 2015-01-23 MED ORDER — HEPARIN SODIUM (PORCINE) 1000 UNIT/ML IJ SOLN
INTRAMUSCULAR | Status: DC | PRN
  Administered 2015-01-23: 4000 [IU] via INTRAVENOUS

## 2015-01-23 MED ORDER — TICAGRELOR 90 MG PO TABS
180.0000 mg | ORAL_TABLET | Freq: Once | ORAL | Status: AC
  Administered 2015-01-23: 180 mg via ORAL
  Filled 2015-01-23: qty 2

## 2015-01-23 MED ORDER — DOCUSATE SODIUM 50 MG/5ML PO LIQD
100.0000 mg | Freq: Two times a day (BID) | ORAL | Status: DC | PRN
  Filled 2015-01-23: qty 10

## 2015-01-23 MED ORDER — FOLIC ACID 1 MG PO TABS
1.0000 mg | ORAL_TABLET | Freq: Every day | ORAL | Status: DC
  Administered 2015-01-24 – 2015-02-01 (×9): 1 mg
  Filled 2015-01-23 (×9): qty 1

## 2015-01-23 MED ORDER — CANGRELOR BOLUS VIA INFUSION
INTRAVENOUS | Status: DC | PRN
  Administered 2015-01-23: 2139 ug via INTRAVENOUS

## 2015-01-23 MED ORDER — SODIUM CHLORIDE 0.9 % IV SOLN
250.0000 mL | INTRAVENOUS | Status: DC | PRN
  Administered 2015-01-26 – 2015-01-27 (×2): 250 mL via INTRAVENOUS

## 2015-01-23 MED ORDER — FAMOTIDINE 40 MG/5ML PO SUSR
20.0000 mg | Freq: Two times a day (BID) | ORAL | Status: DC
  Administered 2015-01-23 – 2015-01-28 (×10): 20 mg
  Filled 2015-01-23 (×11): qty 2.5

## 2015-01-23 MED ORDER — LIDOCAINE HCL (PF) 1 % IJ SOLN
INTRAMUSCULAR | Status: AC
  Filled 2015-01-23: qty 30

## 2015-01-23 MED ORDER — HEPARIN SODIUM (PORCINE) 5000 UNIT/ML IJ SOLN
5000.0000 [IU] | Freq: Three times a day (TID) | INTRAMUSCULAR | Status: DC
  Administered 2015-01-23 – 2015-01-24 (×3): 5000 [IU] via SUBCUTANEOUS
  Filled 2015-01-23 (×5): qty 1

## 2015-01-23 MED ORDER — CETYLPYRIDINIUM CHLORIDE 0.05 % MT LIQD
7.0000 mL | Freq: Four times a day (QID) | OROMUCOSAL | Status: DC
  Administered 2015-01-23 – 2015-02-01 (×31): 7 mL via OROMUCOSAL

## 2015-01-23 MED ORDER — FOLIC ACID 5 MG/ML IJ SOLN
1.0000 mg | Freq: Every day | INTRAMUSCULAR | Status: DC
  Administered 2015-01-23: 1 mg via INTRAVENOUS
  Filled 2015-01-23: qty 0.2

## 2015-01-23 MED ORDER — ONDANSETRON HCL 4 MG/2ML IJ SOLN: 4.0000 mg | Freq: Four times a day (QID) | INTRAMUSCULAR | Status: DC | PRN

## 2015-01-23 MED ORDER — MIDAZOLAM HCL 2 MG/2ML IJ SOLN: 2.0000 mg | INTRAMUSCULAR | Status: DC | PRN

## 2015-01-23 MED ORDER — SODIUM CHLORIDE 0.9 % IJ SOLN: 3.0000 mL | INTRAMUSCULAR | Status: DC | PRN

## 2015-01-23 MED ORDER — SODIUM CHLORIDE 0.9 % IV SOLN
50000.0000 ug | INTRAVENOUS | Status: DC | PRN
  Administered 2015-01-23: 4 ug/kg/min via INTRAVENOUS

## 2015-01-23 MED ORDER — CHLORHEXIDINE GLUCONATE 0.12 % MT SOLN
15.0000 mL | Freq: Two times a day (BID) | OROMUCOSAL | Status: DC
  Administered 2015-01-23 – 2015-02-01 (×19): 15 mL via OROMUCOSAL
  Filled 2015-01-23 (×21): qty 15

## 2015-01-23 MED ORDER — NITROGLYCERIN IN D5W 200-5 MCG/ML-% IV SOLN
5.0000 ug/min | Freq: Once | INTRAVENOUS | Status: AC
  Administered 2015-01-23: 5 ug/min via INTRAVENOUS
  Filled 2015-01-23: qty 250

## 2015-01-23 MED ORDER — METOPROLOL TARTRATE 1 MG/ML IV SOLN
2.5000 mg | INTRAVENOUS | Status: DC | PRN
  Administered 2015-01-24 – 2015-01-30 (×2): 5 mg via INTRAVENOUS
  Filled 2015-01-23 (×2): qty 5

## 2015-01-23 MED ORDER — DEXTROSE-NACL 5-0.9 % IV SOLN
INTRAVENOUS | Status: DC
Start: 2015-01-23 — End: 2015-01-25
  Administered 2015-01-23 – 2015-01-25 (×2): via INTRAVENOUS

## 2015-01-23 MED ORDER — IOHEXOL 350 MG/ML SOLN
INTRAVENOUS | Status: DC | PRN
  Administered 2015-01-23: 50 mL via INTRACARDIAC
  Administered 2015-01-23: 125 mL via INTRAVENOUS

## 2015-01-23 MED ORDER — INSULIN GLARGINE 100 UNIT/ML ~~LOC~~ SOLN
30.0000 [IU] | SUBCUTANEOUS | Status: DC
  Administered 2015-01-23: 30 [IU] via SUBCUTANEOUS
  Filled 2015-01-23 (×5): qty 0.3

## 2015-01-23 MED ORDER — SODIUM CHLORIDE 0.9 % IV SOLN
INTRAVENOUS | Status: DC
  Administered 2015-01-23: 02:00:00 via INTRAVENOUS

## 2015-01-23 MED ORDER — NITROGLYCERIN IN D5W 200-5 MCG/ML-% IV SOLN: 0.0000 ug/min | INTRAVENOUS | Status: DC

## 2015-01-23 MED ORDER — HEPARIN SODIUM (PORCINE) 1000 UNIT/ML IJ SOLN
INTRAMUSCULAR | Status: AC
  Filled 2015-01-23: qty 1

## 2015-01-23 MED ORDER — ATORVASTATIN CALCIUM 80 MG PO TABS
80.0000 mg | ORAL_TABLET | Freq: Every day | ORAL | Status: DC
  Administered 2015-01-23 – 2015-01-31 (×7): 80 mg
  Filled 2015-01-23 (×10): qty 1

## 2015-01-23 MED ORDER — CANGRELOR TETRASODIUM 50 MG IV SOLR
INTRAVENOUS | Status: AC
  Filled 2015-01-23: qty 50

## 2015-01-23 SURGICAL SUPPLY — 22 items
BALLN EUPHORA RX 2.0X15 (BALLOONS) ×2
BALLN ~~LOC~~ EUPHORA RX 2.5X12 (BALLOONS) ×2
BALLOON EUPHORA RX 2.0X15 (BALLOONS) ×1 IMPLANT
BALLOON ~~LOC~~ EUPHORA RX 2.5X12 (BALLOONS) ×1 IMPLANT
CATH INFINITI 5 FR JL3.5 (CATHETERS) ×2 IMPLANT
CATH INFINITI 5FR ANG PIGTAIL (CATHETERS) ×2 IMPLANT
CATH INFINITI 5FR MULTPACK ANG (CATHETERS) IMPLANT
CATH INFINITI JR4 5F (CATHETERS) ×2 IMPLANT
CATH VISTA GUIDE 6FR XBLAD3.0 (CATHETERS) ×2 IMPLANT
DEVICE RAD COMP TR BAND LRG (VASCULAR PRODUCTS) ×2 IMPLANT
GLIDESHEATH SLEND SS 6F .021 (SHEATH) ×2 IMPLANT
KIT ENCORE 26 ADVANTAGE (KITS) ×2 IMPLANT
KIT HEART LEFT (KITS) ×2 IMPLANT
PACK CARDIAC CATHETERIZATION (CUSTOM PROCEDURE TRAY) ×2 IMPLANT
SHEATH PINNACLE 5F 10CM (SHEATH) IMPLANT
STENT SYNERGY DES 2.5X16 (Permanent Stent) ×2 IMPLANT
SYR MEDRAD MARK V 150ML (SYRINGE) ×2 IMPLANT
TRANSDUCER W/STOPCOCK (MISCELLANEOUS) ×2 IMPLANT
TUBING CIL FLEX 10 FLL-RA (TUBING) ×2 IMPLANT
WIRE COUGAR XT STRL 190CM (WIRE) ×2 IMPLANT
WIRE EMERALD 3MM-J .035X150CM (WIRE) IMPLANT
WIRE SAFE-T 1.5MM-J .035X260CM (WIRE) ×2 IMPLANT

## 2015-01-23 NOTE — Procedures (Signed)
Central Venous Catheter Insertion Procedure Note Timothy Lambert [id number] 07/11/1956  Procedure: Insertion of Central Venous Catheter Indications: Assessment of intravascular volume, Drug and/or fluid administration and Frequent blood sampling  Procedure Details Consent: Unable to obtain consent because of altered level of consciousness. Time Out: Verified patient identification, verified procedure, site/side was marked, verified correct patient position, special equipment/implants available, medications/allergies/relevent history reviewed, required imaging and test results available.  Performed  Maximum sterile technique was used including antiseptics, cap, gloves, gown, hand hygiene, mask and sheet. Skin prep: Chlorhexidine; local anesthetic administered A antimicrobial bonded/coated triple lumen catheter was placed in the right internal jugular vein using the Seldinger technique.  Evaluation Blood flow good Complications: No apparent complications Patient did tolerate procedure well. Chest X-ray ordered to verify placement.  CXR: pending.  Procedure performed under direct ultrasound guidance for real time vessel cannulation.      Timothy Lambert, Timothy Lambert Pulmonary [facility] Critical Care Medicine Pager: [phone]  or [phone] 01/23/2015, 4:54 AM

## 2015-01-23 NOTE — H&P (Signed)
PULMONARY / CRITICAL CARE MEDICINE   Name: Timothy Lambert MRN: [id number] DOB: [birth year]    ADMISSION DATE:  01/22/2015 CONSULTATION DATE:  01/23/2015  REFERRING MD :  EDP  CHIEF COMPLAINT:  Seizures  INITIAL PRESENTATION:  59 y.o. M brought to [facility] ED 5/4 with seizures.  In ED, was altered and extremely agitated requiring intubation for airway protection and further workup .  EKG with pre-cordial ST changes concerning for ischemia / infarct.  Pt transferred to [facility] ICU and PCCM to assume care.  After arriving at [facility], repeat EKG with worsened ST elevations + T wave inversions and repeat troponin > 65.  Dr. Aundra Dubin called and pt to be taken for emergent cath.   STUDIES:  CXR 5/4 >>> LLL atx vs infiltrate. CT head 5/4 >>> no acute findings, chronic atrophy and small vessel changes.  SIGNIFICANT EVENTS: 5/4 - admitted with seizures, intubated for airway protection and further workup, EKG findings concerning for ischemia.  Transferred to [facility] ICU for further evaluation and management.   HISTORY OF PRESENT ILLNESS:  Pt is encephalopathic; therefore, this HPI is obtained from chart review. Timothy Lambert is a 59 y.o. M with reported hx by wife of HTN, DM, ETOH abuse.  He was taken to [facility] ED early AM 5/4 for seizure.  His wife reported to EDP that he was last seen normal at 740pm then fell out of his bed at 830pm and was diaphoretic.  EMS was dispatched and on their arrival, he was found to be actively seizing, CBG of 421, SBP of 230, incontinent, and combative.  Wife also reported to EDP that pt drinks alcohol daily but has never had an alcohol withdrawal seizure to her knowledge.  Last drink was on night prior to ED presentation.  She informed EDP that prior to him going to bed, pt was in his USOH, talking and walking normally.  In ED, pt was extremely combative and had AMS requiring intubation.  EKG revealed ST elevations in pre-cordial leads without reciprocal changes.  Dr. Burt Knack of cardiology was  called and recommended transfer to [facility] although due to current clinical status unable to take to cath lab emergently.  He advised that they would follow pt closely after he arrived at [facility].    CT of the head was negative for acute hemorrhage.  Pt was transferred to [facility] uneventfully and PCCM assumed care upon arrival.  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  She reports that he drinks at least 40oz beer per day and sometimes more.  His last drink was on Monday 01/21/15.  After arrival at [facility], repeat EKG showed worsening ST elevation in precordial leads with t wave inversions c/w anteroseptal infarct.  Repeat troponin > 65.  Dr. Aundra Dubin called and pt will be taken to cath lab emergently.  PAST MEDICAL HISTORY : Pt's wife reports that pt has HTN, DM, ETOH abuse   has no past medical history on file.  has no past surgical history on file. Prior to Admission medications   Not on File   No Known Allergies  FAMILY HISTORY:  No family history on file.  SOCIAL HISTORY:  has no tobacco, alcohol, and drug history on file.  REVIEW OF SYSTEMS:  Unable to obtain as pt is encephalopathic.  SUBJECTIVE:   VITAL SIGNS: Temp:  [97.7 F (36.5 C)] 97.7 F (36.5 C) (05/03 2336) Pulse Rate:  [  141] 141 (05/03 2336) Resp:  [34] 34 (05/03 2336) BP: (167)/(102) 167/102 mmHg (05/03 2336) SpO2:  [92 %] 92 % (05/03 2336) FiO2 (%):  [100 %] 100 % (05/03 2352) Weight:  [68.947 kg (152 lb)] 68.947 kg (152 lb) (05/03 2324) HEMODYNAMICS:   VENTILATOR SETTINGS: Vent Mode:  [-] PRVC FiO2 (%):  [100 %] 100 % Set Rate:  [14 bmp] 14 bmp Vt Set:  [510 mL] 510 mL PEEP:  [5 cmH20] 5 cmH20 Plateau Pressure:  [19 cmH20] 19 cmH20 INTAKE / OUTPUT: Intake/Output    None     PHYSICAL EXAMINATION: General: Chronically ill appearing male, in NAD. Neuro: Sedated on vent. HEENT: /AT. PERRL, sclerae anicteric. Cardiovascular: RRR, no M/R/G.  Lungs:  Respirations even and unlabored.  Slightly diminished but otherwise CTA bilaterally, No W/R/R. Abdomen: BS x 4, soft, NT/ND.  Musculoskeletal: No gross deformities, no edema.  Skin: Intact, warm, no rashes.  LABS:  CBC  Recent Labs Lab 01/22/15 2250 01/22/15 2319  WBC 15.3*  --   HGB 14.2 14.6  HCT 39.5 43.0  PLT 264  --    Coag's No results for input(s): APTT, INR in the last 168 hours. BMET  Recent Labs Lab 01/22/15 2250 01/22/15 2319  NA 138 141  K 2.8* 2.8*  CL 97* 97*  CO2 22  --   BUN 22* 21*  CREATININE 1.56* 1.40*  GLUCOSE 487* 498*   Electrolytes  Recent Labs Lab 01/22/15 2250  CALCIUM 9.3   Sepsis Markers  Recent Labs Lab 01/22/15 2319  LATICACIDVEN 11.02*   ABG  Recent Labs Lab 01/23/15 0045  PHART 7.301*  PCO2ART 56.6*  PO2ART 159.0*   Liver Enzymes  Recent Labs Lab 01/22/15 2250  AST 34  ALT 20  ALKPHOS 72  BILITOT 0.6  ALBUMIN 4.6   Cardiac Enzymes  Recent Labs Lab 01/22/15 2250  TROPONINI 0.08*   Glucose  Recent Labs Lab 01/22/15 2349  GLUCAP 427*    Imaging Dg Chest Port 1 View  01/23/2015   CLINICAL DATA:  Seizure  EXAM: PORTABLE CHEST - 1 VIEW  COMPARISON:  None.  FINDINGS: Endotracheal tube 2.8 cm from carina. Normal cardiac silhouette. There is a left lower lobe opacity posterior to the heart. No pleural fluid. No pneumothorax. No overt pulmonary edema.  IMPRESSION: 1. Endotracheal tube in good position. 2. Left lower lobe atelectasis versus infiltrate.   Electronically Signed   By: Suzy Bouchard M.D.   On: 01/23/2015 00:02      ASSESSMENT / PLAN:  CARDIOVASCULAR A:  Hypertensive emergency - initial SBP 230 on EMS arrival and down to 167 on ED arrival.  HTN could have potentially attributed to his seizure (vs ETOH withdrawal) STEMI - EKG with ST elevation in precordial leads with t wave inversions concerning for anteroseptal infarct P:  Cardiology consulted, planning to take to cath lab  now. Continue nitro gtt as needed for goal SBP ~ 125 for roughly 25% reduction. Heparin gtt, ASA, metoprolol. Defer statin initiation to cardiology. Trend troponins, lactate. Repeat EKG now.  PULMONARY OETT 5/4 >>> A: VDRF following GTC seizure P:   Full mechanical support, wean as able. VAP bundle. Hold SBT this AM. ABG and CXR in AM.  RENAL A:   AGMA - lactate AKI - unknown baseline renal function Hypokalemia P:   LR with 70mEq K @ 50. K repletion. Trend lactate. BMP in AM.  GASTROINTESTINAL A:   GI prophylaxis Nutrition P:   SUP: Famotidine. NPO. TF  if remains NPO > 24 hours.  HEMATOLOGIC A:   VTE Prophylaxis P:  SCD's / Heparin gtt. CBC in AM.  INFECTIOUS A:   No indication of infection P:   Monitor clinically.  ENDOCRINE A:   DM - not on outpatient meds P:   Insulin gtt.  NEUROLOGIC A:   Acute metabolic encephalopathy Seizure disorder - no hx of seizures.  Likely related to ETOH withdrawal vs less likely HTN emergency (initial head CT negative for any acute process) ETOH abuse UDS noted positive for benzo's - unsure pt's hx but likely that he received benzos from EMS prior to UDS collection P:   Sedation:  Propofol gtt / Versed PRN. RASS goal: 0 to -1. Daily WUA. Keppra, Ativan PRN. EEG. MRI brain. Neurology consult. Thiamine / Folate.   Family updated: Wife.  Interdisciplinary Family Meeting v Palliative Care Meeting:  Due by: 01/29/15.   [name], [facility] Pager: [phone]  or [phone] 01/23/2015, 1:13 AM

## 2015-01-23 NOTE — Progress Notes (Signed)
Pt transferred to CT on vent with no problems.Marland Kitchen

## 2015-01-23 NOTE — Procedures (Addendum)
ELECTROENCEPHALOGRAM REPORT   Patient: Timothy Lambert      Room #: 2H-11 Age: 59 y.o.        Sex: male Referring Physician: Dr Halford Chessman Report Date:  01/23/2015        Interpreting Physician: Hulen Luster  History: [name] is an 59 y.o. male presenting with seizure and AMS. History of EtOH abuse  Medications:  Scheduled: . antiseptic oral rinse  7 mL Mouth Rinse QID  . aspirin  81 mg Oral Daily  . atorvastatin  80 mg Per Tube q1800  . chlorhexidine  15 mL Mouth Rinse BID  . famotidine (PEPCID) IV  20 mg Intravenous Q12H  . folic acid  1 mg Intravenous Daily  . heparin  5,000 Units Subcutaneous 3 times per day  . lidocaine (cardiac) 100 mg/30ml      . metoprolol  5 mg Intravenous 4 times per day  . potassium chloride  10 mEq Intravenous Q1 Hr x 6  . sodium chloride  3 mL Intravenous Q12H  . succinylcholine      . thiamine  100 mg Intravenous Daily  . ticagrelor  90 mg Oral BID    Conditions of Recording:  This is a 16 channel EEG carried out with the patient in the sedated, intubated state. (on propofol)  Description:  The waking background activity consists predominantly of a low voltage, symmetrical, fairly well organized, slow theta activity, seen from the parieto-occipital and posterior temporal regions. There are brief periods of posterior alpha rhythm in the 8-9Hz  range noted.  Low voltage fast activity, poorly organized, is seen anteriorly and is at times superimposed on more posterior regions.  A mixture of theta and alpha rhythms are seen from the central and temporal regions. No focal slowing or epileptiform activity noted.   Normal sleep architecture is not observed. Hyperventilation and intermittent photic stimulation was not performed.    IMPRESSION: Abnormal EEG due to generalized low voltage slowing indicating a mild to moderate cerebral disturbance (encephalopathy). This can be related to medication effect. No epileptiform activity noted.    Jim Like, DO [facility] [phone]  If 7pm- 7am, please page neurology on call as listed in AMION. 01/23/2015, 9:50 AM

## 2015-01-23 NOTE — Consult Note (Signed)
CARDIOLOGY CONSULT NOTE  Patient ID: Timothy Lambert MRN: [id number] DOB/AGE: [birth year] 59 y.o.  Admit date: 01/22/2015 Reason for Consultation: STEMI  HPI: 59 yo with history of HTN, diabetes, and ETOH abuse was taken to ER after seizure tonight and had ECG concerning for STEMI.  Patient has no cardiac history that is known.  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  He drinks at least a 40 oz beer/day, sometimes more.  Last drink was on Monday.  In the ER, he had altered mental status and was combative.  He was intubated for airway protection.  Also of note, BP was as high as 200s/100s and blood glucose was in the 400s.  Currently sedated.   Review of systems unattainable (patient intubated)  Past Medical History: 1. HTN 2. Diabetes 3. ETOH abuse  FH: No early CAD per wife  History   Social History  . Marital Status: Married    Spouse Name: N/A  . Number of Children: N/A  . Years of Education: N/A   Occupational History  . Not on file.   Social History Main Topics  . Smoking status: Nonsmoker  . Smokeless tobacco: Not on file  . Alcohol Use: Drinks ETOH daily  . Drug Use: Not on file  . Sexual Activity: Not on file   Other Topics Concern  . Works at [employer]   Social History Narrative  . No narrative on file      No prescriptions prior to admission   Scheduled Meds: . [START ON 01/24/2015] aspirin  325 mg Per Tube Daily  . famotidine (PEPCID) IV  20 mg Intravenous Q12H  . folic acid  1 mg Intravenous Daily  . lidocaine (cardiac) 100 mg/30ml      . metoprolol  5 mg Intravenous 4 times per day  . potassium chloride  10 mEq Intravenous Q1 Hr x 6  . succinylcholine      . thiamine  100 mg Intravenous Daily   Continuous Infusions: . heparin 950 Units/hr (01/23/15 0253)  . insulin (NOVOLIN-R) infusion    . lactated ringers with kcl    . nitroGLYCERIN    . propofol (DIPRIVAN) infusion     PRN Meds:.sodium  chloride, acetaminophen, dextrose, docusate, LORazepam, metoprolol, midazolam   Physical exam Blood pressure 143/90, pulse 111, temperature 99.4 F (37.4 C), temperature source Oral, resp. rate 18, height 5\' 6"  (1.676 m), weight 157 lb 3 oz (71.3 kg), SpO2 100 %. General: Intubated/sedated Neck: No JVD, no thyromegaly or thyroid nodule.  Lungs: Decreased breath sounds at bases bilaterally CV: Nondisplaced PMI.  Heart regular S1/S2, no S3/S4, no murmur.  No peripheral edema.  No carotid bruit.  Normal pedal pulses.  Abdomen: Soft, no hepatosplenomegaly, no distention.  Skin: Intact without lesions or rashes.  Neurologic: Intubated/sedated Extremities: No clubbing or cyanosis.  HEENT: Normal.   Labs:   Lab Results  Component Value Date   WBC 15.3* 01/22/2015   HGB 14.6 01/22/2015   HCT 43.0 01/22/2015   MCV 86.4 01/22/2015   PLT 264 01/22/2015    Recent Labs Lab 01/22/15 2250 01/22/15 2319  NA 138 141  K 2.8* 2.8*  CL 97* 97*  CO2 22  --   BUN 22* 21*  CREATININE 1.56* 1.40*  CALCIUM 9.3  --   PROT 7.8  --   BILITOT 0.6  --   ALKPHOS 72  --   ALT 20  --  AST 34  --   GLUCOSE 487* 498*   Lab Results  Component Value Date   TROPONINI >65.00* 01/23/2015   Radiology: - CXR: LLL atelectasis versus infiltrate - CT head: No acute changes, small vessel disease and atrophy  EKG: NSR, anterior STEMI  ASSESSMENT AND PLAN: 59 yo with history of HTN, diabetes, and ETOH abuse was taken to ER after seizure tonight and had ECG concerning for STEMI.  1. CAD: ECG with anterior STEMI, troponin > 65.  Patient presented in the setting of suspected ETOH withdrawal seizure and was altered and combative, unable to elicit symptoms.  Now stabilized and intubated.  He is on NTG gtt and heparin gtt, had ASA at [facility]. Will plan emergent cardiac catheterization.  2. HTN: Hypertensive emergency in setting of STEMI and seizure, now controlled on NTG gtt.  3. Diabetes: Uncontrolled, blood  glucose in 400s.  Per primary service.  4. Seizure: Suspect ETOH withdrawal, alternatively related to HTN. CT head did not show CVA/bleed.  He is now sedated.   Loralie Champagne 01/23/2015 5:06 AM

## 2015-01-23 NOTE — ED Notes (Addendum)
2343 Intubated, 7.5 ETT 23 @ [facility].

## 2015-01-23 NOTE — Care Management Note (Signed)
Case Management Note  Patient Details  Name: Timothy Lambert MRN: [id number] Date of Birth: [birth year]  Subjective/Objective:          Adm w htn urgency, vent          Action/Plan:lives w wife who works at [employer], no pcp, no ins at present   Expected Discharge Date:                  Expected Discharge Plan:  [place]  In-House Referral:     Discharge planning Services  CM Consult, [facility]  Post Acute Care Choice:    Choice offered to:     DME Arranged:    DME Agency:     HH Arranged:    [place] Agency:     Status of Service:     Medicare Important Message Given:    Date Medicare IM Given:    Medicare IM give by:    Date Additional Medicare IM Given:    Additional Medicare Important Message give by:     If discussed at [place] of Stay Meetings, dates discussed:    Additional Comments:5/4 1021 debbie Ger Nicks rn,bsn Gave wife inform on [facility] and [place] [facility].  Lacretia Leigh, RN 01/23/2015, 10:21 AM

## 2015-01-23 NOTE — Progress Notes (Signed)
ANTICOAGULATION CONSULT NOTE - Initial Consult  Pharmacy Consult for Heparin Indication: chest pain/ACS  No Known Allergies  Patient Measurements: Height: 5\' 6"  (167.6 cm) Weight: 152 lb (68.947 kg) IBW/kg (Calculated) : 63.8  Vital Signs: Temp: 97.7 F (36.5 C) (05/03 2336) Temp Source: Oral (05/03 2336) BP: 167/102 mmHg (05/03 2336) Pulse Rate: 141 (05/03 2336)  Labs:  Recent Labs  01/22/15 2250 01/22/15 2319  HGB 14.2 14.6  HCT 39.5 43.0  PLT 264  --   CREATININE 1.56* 1.40*  TROPONINI 0.08*  --     Estimated Creatinine Clearance: 51.9 mL/min (by C-G formula based on Cr of 1.4).   Medical History: No past medical history on file.  Assessment: 59yo male.  Okay for protocol.  Asked to initiate Heparin for ACS.  Goal of Therapy:  Heparin level 0.3-0.7 units/ml Monitor platelets by anticoagulation protocol: Yes   Plan:  Heparin 4000 units IV now Heparin infusion at 950 units/hr Heparin level daily  Meko Bellanger A 01/23/2015,1:12 AM

## 2015-01-23 NOTE — Interval H&P Note (Signed)
History and Physical Interval Note:  01/23/2015 5:21 AM  Timothy Lambert  has presented today for surgery, with the diagnosis of * No surgery found *  The various methods of treatment have been discussed with the patient and family. After consideration of risks, benefits and other options for treatment, the patient has consented to  cardiac catheterization and possible PCI as a surgical intervention .  The patient's history has been reviewed, patient examined, no change in status, stable for surgery.  I have reviewed the patient's chart and labs.    The patient is intubated and sedated. I was called by Dr. Reather Converse from the [facility] ER earlier tonight because the patient's EKG showed ST segment elevation. However, his clinical syndrome was consistent with acute seizures and altered mental status. He required intubation to protect his airway. He underwent a CT scan of the brain. He was transferred here to the critical care service. The patient has not had any chest pain. He has had severe uncontrollable hypertension since arrival. His troponin has now come back greater than 65 and his EKG shows evolving changes. He was evaluated by Dr. Aundra Dubin who spoke to the patient's wife over the telephone. We plan on emergent cath +/- PCI for EKG demonstrating anterior MI. Presumably he has had grand mal seizures precipitating an acute MI. There was a non-system delay to cath because of medical contraindication in setting of seizures, need for airway protection, and clinical syndrome inconsistent with acute coronary syndrome.  [name]

## 2015-01-23 NOTE — Progress Notes (Signed)
Fentanyl 200 ml and versed 15 ml wasted in sink. Ellsworth Lennox RN and Melene Plan RN

## 2015-01-23 NOTE — Progress Notes (Signed)
EEG Completed; Results Pending  

## 2015-01-23 NOTE — Progress Notes (Signed)
EKG CRITICAL VALUE     12 lead EKG performed.  Critical value noted. Ellsworth Lennox, RN notified.   Hattie Perch H, CCT 01/23/2015 9:13 AM

## 2015-01-23 NOTE — H&P (View-Only) (Signed)
CARDIOLOGY CONSULT NOTE  Patient ID: Timothy Lambert MRN: [id number] DOB/AGE: Mar 07, 1956 59 y.o.  Admit date: 01/22/2015 Reason for Consultation: STEMI  HPI: 59 yo with history of HTN, diabetes, and ETOH abuse was taken to ER after seizure tonight and had ECG concerning for STEMI.  Patient has no cardiac history that is known.  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  He drinks at least a 40 oz beer/day, sometimes more.  Last drink was on Monday.  In the ER, he had altered mental status and was combative.  He was intubated for airway protection.  Also of note, BP was as high as 200s/100s and blood glucose was in the 400s.  Currently sedated.   Review of systems unattainable (patient intubated)  Past Medical History: 1. HTN 2. Diabetes 3. ETOH abuse  FH: No early CAD per wife  History   Social History  . Marital Status: Married    Spouse Name: N/A  . Number of Children: N/A  . Years of Education: N/A   Occupational History  . Not on file.   Social History Main Topics  . Smoking status: Nonsmoker  . Smokeless tobacco: Not on file  . Alcohol Use: Drinks ETOH daily  . Drug Use: Not on file  . Sexual Activity: Not on file   Other Topics Concern  . Works at [employer]   Social History Narrative  . No narrative on file      No prescriptions prior to admission   Scheduled Meds: . [START ON 01/24/2015] aspirin  325 mg Per Tube Daily  . famotidine (PEPCID) IV  20 mg Intravenous Q12H  . folic acid  1 mg Intravenous Daily  . lidocaine (cardiac) 100 mg/40ml      . metoprolol  5 mg Intravenous 4 times per day  . potassium chloride  10 mEq Intravenous Q1 Hr x 6  . succinylcholine      . thiamine  100 mg Intravenous Daily   Continuous Infusions: . heparin 950 Units/hr (01/23/15 0253)  . insulin (NOVOLIN-R) infusion    . lactated ringers with kcl    . nitroGLYCERIN    . propofol (DIPRIVAN) infusion     PRN Meds:.sodium  chloride, acetaminophen, dextrose, docusate, LORazepam, metoprolol, midazolam   Physical exam Blood pressure 143/90, pulse 111, temperature 99.4 F (37.4 C), temperature source Oral, resp. rate 18, height 5\' 6"  (1.676 m), weight 157 lb 3 oz (71.3 kg), SpO2 100 %. General: Intubated/sedated Neck: No JVD, no thyromegaly or thyroid nodule.  Lungs: Decreased breath sounds at bases bilaterally CV: Nondisplaced PMI.  Heart regular S1/S2, no S3/S4, no murmur.  No peripheral edema.  No carotid bruit.  Normal pedal pulses.  Abdomen: Soft, no hepatosplenomegaly, no distention.  Skin: Intact without lesions or rashes.  Neurologic: Intubated/sedated Extremities: No clubbing or cyanosis.  HEENT: Normal.   Labs:   Lab Results  Component Value Date   WBC 15.3* 01/22/2015   HGB 14.6 01/22/2015   HCT 43.0 01/22/2015   MCV 86.4 01/22/2015   PLT 264 01/22/2015    Recent Labs Lab 01/22/15 2250 01/22/15 2319  NA 138 141  K 2.8* 2.8*  CL 97* 97*  CO2 22  --   BUN 22* 21*  CREATININE 1.56* 1.40*  CALCIUM 9.3  --   PROT 7.8  --   BILITOT 0.6  --   ALKPHOS 72  --   ALT 20  --  AST 34  --   GLUCOSE 487* 498*   Lab Results  Component Value Date   TROPONINI >65.00* 01/23/2015   Radiology: - CXR: LLL atelectasis versus infiltrate - CT head: No acute changes, small vessel disease and atrophy  EKG: NSR, anterior STEMI  ASSESSMENT AND PLAN: 59 yo with history of HTN, diabetes, and ETOH abuse was taken to ER after seizure tonight and had ECG concerning for STEMI.  1. CAD: ECG with anterior STEMI, troponin > 65.  Patient presented in the setting of suspected ETOH withdrawal seizure and was altered and combative, unable to elicit symptoms.  Now stabilized and intubated.  He is on NTG gtt and heparin gtt, had ASA at [facility]. Will plan emergent cardiac catheterization.  2. HTN: Hypertensive emergency in setting of STEMI and seizure, now controlled on NTG gtt.  3. Diabetes: Uncontrolled, blood  glucose in 400s.  Per primary service.  4. Seizure: Suspect ETOH withdrawal, alternatively related to HTN. CT head did not show CVA/bleed.  He is now sedated.   Loralie Champagne 01/23/2015 5:06 AM

## 2015-01-23 NOTE — ED Notes (Signed)
CRITICAL VALUE ALERT  Critical value received:  Troponin >65.0  Date of notification:  01/22/14  Time of notification:  0407  Critical value read back:Yes.    Nurse who received alert:  Y. Jackey Housey, RN  MD notified (1st page):  Dr. Alva Garnet  Time of first page:  [phone] hrs  Responding MD:  Dr. Alva Garnet  Time MD responded:  0414 hrs

## 2015-01-24 ENCOUNTER — Inpatient Hospital Stay (HOSPITAL_COMMUNITY): Payer: Medicaid Other

## 2015-01-24 DIAGNOSIS — J9600 Acute respiratory failure, unspecified whether with hypoxia or hypercapnia: Secondary | ICD-10-CM | POA: Diagnosis present

## 2015-01-24 DIAGNOSIS — J9601 Acute respiratory failure with hypoxia: Secondary | ICD-10-CM

## 2015-01-24 DIAGNOSIS — N179 Acute kidney failure, unspecified: Secondary | ICD-10-CM | POA: Diagnosis present

## 2015-01-24 DIAGNOSIS — I2109 ST elevation (STEMI) myocardial infarction involving other coronary artery of anterior wall: Secondary | ICD-10-CM

## 2015-01-24 DIAGNOSIS — I213 ST elevation (STEMI) myocardial infarction of unspecified site: Secondary | ICD-10-CM

## 2015-01-24 DIAGNOSIS — I6319 Cerebral infarction due to embolism of other precerebral artery: Secondary | ICD-10-CM

## 2015-01-24 LAB — GLUCOSE, CAPILLARY
GLUCOSE-CAPILLARY: 98 mg/dL (ref 70–99)
GLUCOSE-CAPILLARY: 94 mg/dL (ref 70–99)
Glucose-Capillary: 100 mg/dL — ABNORMAL HIGH (ref 70–99)
Glucose-Capillary: 127 mg/dL — ABNORMAL HIGH (ref 70–99)
Glucose-Capillary: 129 mg/dL — ABNORMAL HIGH (ref 70–99)
Glucose-Capillary: 162 mg/dL — ABNORMAL HIGH (ref 70–99)
Glucose-Capillary: 162 mg/dL — ABNORMAL HIGH (ref 70–99)

## 2015-01-24 LAB — BASIC METABOLIC PANEL
Anion gap: 8 (ref 5–15)
Anion gap: 10 (ref 5–15)
BUN: 26 mg/dL — AB (ref 6–20)
BUN: 24 mg/dL — ABNORMAL HIGH (ref 6–20)
CALCIUM: 8.4 mg/dL — AB (ref 8.9–10.3)
CO2: 24 mmol/L (ref 22–32)
CO2: 23 mmol/L (ref 22–32)
CREATININE: 1.7 mg/dL — AB (ref 0.61–1.24)
Calcium: 8.4 mg/dL — ABNORMAL LOW (ref 8.9–10.3)
Chloride: 109 mmol/L (ref 101–111)
Chloride: 109 mmol/L (ref 101–111)
Creatinine, Ser: 1.85 mg/dL — ABNORMAL HIGH (ref 0.61–1.24)
GFR calc Af Amer: 45 mL/min — ABNORMAL LOW (ref >60)
GFR calc non Af Amer: 39 mL/min — ABNORMAL LOW (ref >60)
GFR calc non Af Amer: 43 mL/min — ABNORMAL LOW (ref >60)
GFR, EST AFRICAN AMERICAN: 49 mL/min — AB (ref >60)
GLUCOSE: 108 mg/dL — AB (ref 70–99)
Glucose, Bld: 133 mg/dL — ABNORMAL HIGH (ref 70–99)
POTASSIUM: 3.5 mmol/L (ref 3.5–5.1)
Potassium: 3.6 mmol/L (ref 3.5–5.1)
SODIUM: 142 mmol/L (ref 135–145)
Sodium: 141 mmol/L (ref 135–145)

## 2015-01-24 LAB — CBC
HEMATOCRIT: 30.6 % — AB (ref 39.0–52.0)
Hemoglobin: 10.8 g/dL — ABNORMAL LOW (ref 13.0–17.0)
MCH: 30.8 pg (ref 26.0–34.0)
MCHC: 35.3 g/dL (ref 30.0–36.0)
MCV: 87.2 fL (ref 78.0–100.0)
Platelets: 167 10*3/uL (ref 150–400)
RBC: 3.51 MIL/uL — ABNORMAL LOW (ref 4.22–5.81)
RDW: 13.9 % (ref 11.5–15.5)
WBC: 10.7 10*3/uL — ABNORMAL HIGH (ref 4.0–10.5)

## 2015-01-24 LAB — HEPARIN LEVEL (UNFRACTIONATED): HEPARIN UNFRACTIONATED: 0.17 [IU]/mL — AB (ref 0.30–0.70)

## 2015-01-24 MED ORDER — ETOMIDATE 2 MG/ML IV SOLN
20.0000 mg | Freq: Once | INTRAVENOUS | Status: AC
  Administered 2015-01-24: 20 mg via INTRAVENOUS

## 2015-01-24 MED ORDER — ETOMIDATE 2 MG/ML IV SOLN
INTRAVENOUS | Status: AC
Start: 2015-01-24 — End: 2015-01-24
  Administered 2015-01-24: 20 mg via INTRAVENOUS
  Filled 2015-01-24: qty 20

## 2015-01-24 MED ORDER — PROPOFOL 1000 MG/100ML IV EMUL
INTRAVENOUS | Status: AC
  Filled 2015-01-24: qty 100

## 2015-01-24 MED ORDER — ROCURONIUM BROMIDE 50 MG/5ML IV SOLN
INTRAVENOUS | Status: AC
  Administered 2015-01-24: 50 mg via INTRAVENOUS
  Filled 2015-01-24: qty 2

## 2015-01-24 MED ORDER — SUCCINYLCHOLINE CHLORIDE 20 MG/ML IJ SOLN
INTRAMUSCULAR | Status: AC
  Filled 2015-01-24: qty 1

## 2015-01-24 MED ORDER — DEXMEDETOMIDINE HCL IN NACL 200 MCG/50ML IV SOLN
0.0000 ug/kg/h | INTRAVENOUS | Status: DC
  Administered 2015-01-24: 0.4 ug/kg/h via INTRAVENOUS
  Filled 2015-01-24: qty 50

## 2015-01-24 MED ORDER — FENTANYL CITRATE (PF) 100 MCG/2ML IJ SOLN
100.0000 ug | Freq: Once | INTRAMUSCULAR | Status: AC
  Administered 2015-01-24: 100 ug via INTRAVENOUS

## 2015-01-24 MED ORDER — PROPOFOL 1000 MG/100ML IV EMUL
5.0000 ug/kg/min | INTRAVENOUS | Status: DC
  Administered 2015-01-24: 30 ug/kg/min via INTRAVENOUS
  Administered 2015-01-24: 40 ug/kg/min via INTRAVENOUS
  Administered 2015-01-25 (×4): 35 ug/kg/min via INTRAVENOUS
  Administered 2015-01-26: 25 ug/kg/min via INTRAVENOUS
  Administered 2015-01-26: 20 ug/kg/min via INTRAVENOUS
  Administered 2015-01-27: 10 ug/kg/min via INTRAVENOUS
  Administered 2015-01-27: 20 ug/kg/min via INTRAVENOUS
  Administered 2015-01-28: 25 ug/kg/min via INTRAVENOUS
  Filled 2015-01-24 (×11): qty 100

## 2015-01-24 MED ORDER — LIDOCAINE HCL (CARDIAC) 20 MG/ML IV SOLN
INTRAVENOUS | Status: AC
  Filled 2015-01-24: qty 5

## 2015-01-24 MED ORDER — CARVEDILOL 3.125 MG PO TABS
3.1250 mg | ORAL_TABLET | Freq: Two times a day (BID) | ORAL | Status: DC
  Administered 2015-01-24 – 2015-01-28 (×8): 3.125 mg via ORAL
  Filled 2015-01-24 (×11): qty 1

## 2015-01-24 MED ORDER — MIDAZOLAM HCL 2 MG/2ML IJ SOLN
INTRAMUSCULAR | Status: AC
  Filled 2015-01-24: qty 2

## 2015-01-24 MED ORDER — FENTANYL CITRATE (PF) 100 MCG/2ML IJ SOLN
INTRAMUSCULAR | Status: AC
  Administered 2015-01-24: 100 ug via INTRAVENOUS
  Filled 2015-01-24: qty 2

## 2015-01-24 MED ORDER — ROCURONIUM BROMIDE 50 MG/5ML IV SOLN
50.0000 mg | Freq: Once | INTRAVENOUS | Status: AC
  Administered 2015-01-24: 50 mg via INTRAVENOUS

## 2015-01-24 MED ORDER — HEPARIN (PORCINE) IN NACL 100-0.45 UNIT/ML-% IJ SOLN
1100.0000 [IU]/h | INTRAMUSCULAR | Status: DC
  Administered 2015-01-24: 850 [IU]/h via INTRAVENOUS
  Filled 2015-01-24 (×3): qty 250

## 2015-01-24 NOTE — Progress Notes (Signed)
ANTICOAGULATION CONSULT NOTE - Initial Consult  Pharmacy Consult for Heparin Indication: stroke  No Known Allergies  Patient Measurements: Height: 5\' 6"  (167.6 cm) Weight: 159 lb 13.3 oz (72.5 kg) IBW/kg (Calculated) : 63.8  Vital Signs: Temp: 100 F (37.8 C) (05/05 0900) Temp Source: Oral (05/05 0804) BP: 141/88 mmHg (05/05 0900) Pulse Rate: 98 (05/05 0900)  Labs:  Recent Labs  01/22/15 2250 01/22/15 2319 01/23/15 0309 01/23/15 0530 01/24/15 0349  HGB 14.2 14.6  --  11.3* 10.8*  HCT 39.5 43.0  --  31.2* 30.6*  PLT 264  --   --  209 167  CREATININE 1.56* 1.40*  --  1.66* 1.85*  TROPONINI 0.08*  --  >65.00* >65.00*  --     Estimated Creatinine Clearance: 39.3 mL/min (by C-G formula based on Cr of 1.85).   Medical History: Past Medical History  Diagnosis Date  . Coronary artery disease   . Hypertension   . Diabetes mellitus without complication     Medications:  Scheduled:  . antiseptic oral rinse  7 mL Mouth Rinse QID  . aspirin  81 mg Oral Daily  . atorvastatin  80 mg Per Tube q1800  . carvedilol  3.125 mg Oral BID WC  . chlorhexidine  15 mL Mouth Rinse BID  . famotidine  20 mg Per Tube BID  . folic acid  1 mg Per Tube Daily  . insulin aspart  2-6 Units Subcutaneous 6 times per day  . insulin glargine  30 Units Subcutaneous Q24H  . pneumococcal 23 valent vaccine  0.5 mL Intramuscular Tomorrow-1000  . thiamine  100 mg Per Tube Daily  . ticagrelor  90 mg Oral BID   Infusions:  . dextrose    . dextrose 5 % and 0.9% NaCl 30 mL/hr at 01/24/15 1000  . heparin    . nitroGLYCERIN    . propofol (DIPRIVAN) infusion 15 mcg/kg/min (01/24/15 0800)    Assessment: Timothy Lambert is an 59 y.o. male transferred from [facility] admitted on 01/22/2015 presenting with seizures.  Patient extremely agitated during intubation for airway protection.  EKG revealed pre-cordial ST changes concerning for infarct.  Transferred to [facility] ICU to undergo emergent cath once hemodynamically  stabilized.  MRI 5/4 revealing acute scattered small strokes consistent with embolic process.  Pharmacy has been consulted to dose Heparin.    Hgb low trending down, plt wnl.  CT neg for hemorrhage.  No other bleeding reported at this time.    Goal of Therapy:  Heparin level 0.3-0.5 units/ml Monitor platelets by anticoagulation protocol: Yes   Plan:  - Heparin 850 units/hr (no bolus) - Heparin level 8 hours after initiation of infusion - Daily heparin level and CBC - Monitor for signs and symptoms of bleeding  Hassie Bruce, Pharm. D. Clinical Pharmacy Resident Pager: [phone] Ph: [phone] 01/24/2015 11:08 AM

## 2015-01-24 NOTE — Procedures (Signed)
Extubation Procedure Note  Patient Details:   Name: Timothy Lambert DOB: 1956-09-05 MRN: [id number]   Airway Documentation:     Evaluation  O2 sats: stable throughout Complications: No apparent complications Patient did tolerate procedure well. Bilateral Breath Sounds: Rhonchi Suctioning: Airway Yes   Extubated pt to 4L nasal cannula. Tolerated well.  HR 125 RR 30 SpO2 - 93% on 4L nasal cannula  Jori Moll 01/24/2015, 11:37 AM

## 2015-01-24 NOTE — Progress Notes (Signed)
EKG CRITICAL VALUE     12 lead EKG performed.  Critical value noted. Arma Heading, RN notified.   Melissa Montane, CCT 01/24/2015 7:42 AM

## 2015-01-24 NOTE — Progress Notes (Signed)
Pt with increasing restlessness and agitation; pulling at tubing and wires, unable to lie supine in bed; disoriented x 4 despite continuous reorientation being provided; HR, respiratory rate, and BP elevated per VS flowsheet; oxygen sats decreasing to 85-88%; respiratory at bedside; MD paged: Marni Griffon NP at bedside; preparing for RSI

## 2015-01-24 NOTE — Progress Notes (Signed)
  Echocardiogram 2D Echocardiogram has been performed.  Timothy Lambert 01/24/2015, 1:17 PM

## 2015-01-24 NOTE — Procedures (Signed)
Intubation Procedure Note Timothy Lambert [id number] 1956-06-16  Procedure: Intubation Indications: Respiratory insufficiency  Procedure Details Consent: Unable to obtain consent because of emergent medical necessity. Time Out: Verified patient identification, verified procedure, site/side was marked, verified correct patient position, special equipment/implants available, medications/allergies/relevent history reviewed, required imaging and test results available.  Performed  Maximum sterile technique was used including gloves, hand hygiene and mask.  MAC and 3 glidescope     Evaluation Hemodynamic Status: BP stable throughout; O2 sats: stable throughout Patient's Current Condition: stable Complications: No apparent complications Patient did tolerate procedure well. Chest X-ray ordered to verify placement.  CXR: pending.   Timothy Mires, MD [facility] Pulmonary/Critical Care 01/24/2015, 2:55 PM Pager:  [phone] After 3pm call: [phone]

## 2015-01-24 NOTE — Progress Notes (Addendum)
Arrived on unit to find pt agitated, restless, hypoxic and tachycardic in spite of precedex gtt and high flow O2.   BP 189/148 mmHg  Pulse 124  Temp(Src) 100.2 F (37.9 C) (Oral)  Resp 26  Ht 5\' 6"  (1.676 m)  Wt 72.5 kg (159 lb 13.3 oz)  BMI 25.81 kg/m2  SpO2 95%  Gen: 59 yoaam agitated and restless w/ marked accessory muscle use. HENT: MM dry. Pulm: + accessory muscle use, scattered rhonchi. Card:RRR tachy. Ext: warm, + pulses. Abd: soft. + bowel sounds. Neuro: awake, agitated.    Recent Labs Lab 01/22/15 2250 01/22/15 2319 01/23/15 0530 01/24/15 0349  NA 138 141 138 141  K 2.8* 2.8* 4.1 3.5  CL 97* 97* 101 109  CO2 22  --  26 24  BUN 22* 21* 23* 26*  CREATININE 1.56* 1.40* 1.66* 1.85*  GLUCOSE 487* 498* 478* 108*    Recent Labs Lab 01/22/15 2250 01/22/15 2319 01/23/15 0530 01/24/15 0349  HGB 14.2 14.6 11.3* 10.8*  HCT 39.5 43.0 31.2* 30.6*  WBC 15.3*  --  13.4* 10.7*  PLT 264  --  209 167   IP  Acute hypoxic respiratory failure. Not clear what's driving this currently.. ? Pulmonary edema ? Risk of aspiration. Not able to protect airway in setting of ETOH w/d and not candidate for NIPPV given NSTEMI.  Plan -reintubate -PAD protocol, sedate w/ precedex Cont previous supportive care as outlined per Dr Lamonte Sakai on earlier [place] ACNP-BC [place] Pager # [phone] OR # [phone] if no answer  Spoke with pt's wife over the phone.  Explained to her the issues Timothy Lambert was having with his respiratory status and oxygenation.  Reviewed his MRI brain and EEG results with her.  Explained that Echo results are still pending.  CC time 40 minutes.  Chesley Mires, MD [facility] Pulmonary/Critical Care 01/24/2015, 3:20 PM Pager:  [phone] After 3pm call: [phone]

## 2015-01-24 NOTE — Progress Notes (Signed)
PULMONARY / CRITICAL CARE MEDICINE   Name: Timothy Lambert MRN: [id number] DOB: July 20, 1956    ADMISSION DATE:  01/22/2015 CONSULTATION DATE:  01/24/2015  REFERRING MD :  EDP  CHIEF COMPLAINT:  Seizures  INITIAL PRESENTATION:  59 y.o. M brought to [facility] ED 5/4 with seizures.  In ED, was altered and extremely agitated requiring intubation for airway protection and further workup .  EKG with STEMI. Taken urgently to cath and PTCI to LAD. Back to ICU on MV   STUDIES:  CXR 5/4 >>> LLL atx vs infiltrate. CT head 5/4 >>> no acute findings, chronic atrophy and small vessel changes. EEG 5/4 >> no seizure focus or active seizures MRI brain 5/4 >> multiple small foci of acute ischemia in cerebrum and cerebellum, suggestive of embolic process.  TTE 5/5 >>   SIGNIFICANT EVENTS: 5/4 - admitted with seizures, intubated for airway protection and further workup, EKG findings concerning for ischemia.  Transferred to [facility] ICU for further evaluation and management.  SUBJECTIVE:  Sedated and calm o/n, no seizures.  EEG and MRI brain done as above  VITAL SIGNS: Temp:  [99.5 F (37.5 C)-100.9 F (38.3 C)] 99.8 F (37.7 C) (05/05 0804) Pulse Rate:  [91-114] 98 (05/05 0900) Resp:  [18-24] 20 (05/05 0900) BP: (109-156)/(73-109) 141/88 mmHg (05/05 0900) SpO2:  [100 %] 100 % (05/05 0900) FiO2 (%):  [40 %-50 %] 40 % (05/05 0900) Weight:  [72.5 kg (159 lb 13.3 oz)] 72.5 kg (159 lb 13.3 oz) (05/05 0435) HEMODYNAMICS:   VENTILATOR SETTINGS: Vent Mode:  [-] PRVC FiO2 (%):  [40 %-50 %] 40 % Set Rate:  [16 bmp] 16 bmp Vt Set:  [510 mL] 510 mL PEEP:  [5 cmH20] 5 cmH20 Plateau Pressure:  [7 cmH20-17 cmH20] 17 cmH20 INTAKE / OUTPUT: Intake/Output      05/04 0701 - 05/05 0700 05/05 0701 - 05/06 0700   I.V. (mL/kg) 1106.1 (15.3)    IV Piggyback 250    Total Intake(mL/kg) 1356.1 (18.7)    Urine (mL/kg/hr) 1135 (0.7)    Stool 0 (0)    Total Output 1135     Net +221.1          Stool Occurrence 2 x       PHYSICAL EXAMINATION: General: Chronically ill appearing male, in NAD. Neuro: Sedated on vent. HEENT: [place]/AT. PERRL, sclerae anicteric. Cardiovascular: RRR, no M/R/G.  Lungs: Respirations even and unlabored.  Slightly diminished but otherwise CTA bilaterally, No W/R/R. Abdomen: BS x 4, soft, NT/ND.  Musculoskeletal: No gross deformities, no edema.  Skin: Intact, warm, no rashes.  LABS:  CBC  Recent Labs Lab 01/22/15 2250 01/22/15 2319 01/23/15 0530 01/24/15 0349  WBC 15.3*  --  13.4* 10.7*  HGB 14.2 14.6 11.3* 10.8*  HCT 39.5 43.0 31.2* 30.6*  PLT 264  --  209 167   Coag's No results for input(s): APTT, INR in the last 168 hours. BMET  Recent Labs Lab 01/22/15 2250 01/22/15 2319 01/23/15 0530 01/24/15 0349  NA 138 141 138 141  K 2.8* 2.8* 4.1 3.5  CL 97* 97* 101 109  CO2 22  --  26 24  BUN 22* 21* 23* 26*  CREATININE 1.56* 1.40* 1.66* 1.85*  GLUCOSE 487* 498* 478* 108*   Electrolytes  Recent Labs Lab 01/22/15 2250 01/23/15 0530 01/24/15 0349  CALCIUM 9.3 8.3* 8.4*   Sepsis Markers  Recent Labs Lab 01/22/15 2319 01/23/15 0530 01/23/15 0758  LATICACIDVEN 11.02* 2.3* 2.9*   ABG  Recent  Labs Lab 01/23/15 0045  PHART 7.301*  PCO2ART 56.6*  PO2ART 159.0*   Liver Enzymes  Recent Labs Lab 01/22/15 2250 01/23/15 0530  AST 34 274*  ALT 20 44  ALKPHOS 72 50  BILITOT 0.6 0.8  ALBUMIN 4.6 3.4*   Cardiac Enzymes  Recent Labs Lab 01/22/15 2250 01/23/15 0309 01/23/15 0530  TROPONINI 0.08* >65.00* >65.00*   Glucose  Recent Labs Lab 01/23/15 1728 01/23/15 1829 01/23/15 1934 01/23/15 2033 01/24/15 0048 01/24/15 0425  GLUCAP 198* 167* 149* 141* 129* 100*    Imaging Ct Head Wo Contrast  01/23/2015   CLINICAL DATA:  Altered mental status  EXAM: CT HEAD WITHOUT CONTRAST  TECHNIQUE: Contiguous axial images were obtained from the base of the skull through the vertex without intravenous contrast.  COMPARISON:  None.  FINDINGS: There is  no intracranial hemorrhage, mass or evidence of acute infarction. There is moderate generalized atrophy. There is mild chronic microvascular ischemic change. There is no significant extra-axial fluid collection.  No acute intracranial findings are evident.  IMPRESSION: Moderate chronic atrophy and mild chronic small vessel changes. No acute intracranial findings. Mild motion degradation of the images.   Electronically Signed   By: Andreas Newport M.D.   On: 01/23/2015 00:33   Dg Chest Port 1 View  01/23/2015   CLINICAL DATA:  Central line placement and gastric tube placement  EXAM: PORTABLE CHEST - 1 VIEW  COMPARISON:  None.  FINDINGS: The endotracheal tube tip is 3.5 cm above the carina. The gastric tube reaches the stomach but the side port is at or above the EG junction. The tube should be advanced for optimal placement. The right jugular central line extends into the SVC.  There is no pneumothorax. There is mild central ground-glass opacity without dense focal airspace consolidation. There is no large effusion.  IMPRESSION: Satisfactory ET tube position. Gastric tube reaches the stomach but should be advanced for optimal placement. Central line is satisfactorily positioned. No pneumothorax.   Electronically Signed   By: Andreas Newport M.D.   On: 01/23/2015 05:31      ASSESSMENT / PLAN:  CARDIOVASCULAR A:  Hypertensive emergency - initial SBP 230 on EMS arrival and down to 167 on ED arrival.  HTN could have potentially attributed to his seizure (vs ETOH withdrawal) STEMI - EKG with ST elevation in precordial leads with t wave inversions concerning for anteroseptal infarct S/p PTCI to LAD ? possible endocarditis given concern for neurological embolic event P:  Nitro gtt available prn  Heparin gtt completed, on brilinta ASA, metoprolol. Lipitor TTE pending to look for valvular disease, PFO, as well as LV fxn  PULMONARY OETT 5/4 >>> A: VDRF following GTC seizure, acute MI P:   Full  mechanical support, wean as able. VAP bundle. Goal move to SBT 5/5 am Follow ABG and CXR  RENAL A:   AGMA - lactate, improved AKI - unknown baseline renal function, progressive 5/5 but with adequate UOP Hypokalemia, resolved P:   IVF changed to D5NS K repletion as needed Follow BMP  GASTROINTESTINAL A:   GI prophylaxis Nutrition P:   SUP: Famotidine. NPO. TF if unable to extubate today  HEMATOLOGIC A:   VTE Prophylaxis P:  Heparin sq Follow CBC intermittently   INFECTIOUS A:   No indication of infection P:   Monitor clinically.  ENDOCRINE A:   DM - not on outpatient meds P:   SSI   NEUROLOGIC A:   Acute metabolic encephalopathy Seizure disorder - no  hx of seizures.  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 At risk for EtOH withdrawal as sedating gtts are weaned to off, will need CIWA coverage RASS goal: 0 to -1. Daily WUA. Keppra scheduled, Ativan PRN. Neurology following On ASA, no apparent contraindication to brilinta in setting CVA's > would appreciate neurology's comments about this  Thiamine / Folate.   Family updated: Wife.  Interdisciplinary Family Meeting v Palliative Care Meeting:  Due by: 01/29/15.  Independent CC time 40 minutes.    Baltazar Apo, MD, PhD 01/24/2015, 9:25 AM [facility] [phone] or if no answer [phone]

## 2015-01-24 NOTE — Consult Note (Addendum)
Reason for Consult:Stroke Referring Physician: Halford Chessman   CC: Seizure  [name] is an 59 y.o. male who was initially presented to [facility] on 5/4 after presenting for seizure and altered mental status.  Had an EKG concerning for STEMI at that time as well and BP was elevated.  Patient with a history of ETOH abuse.  Required intubation for airway protection and was transferred to [facility] for further management.  Was taken to cath lab for PTCI to LAD.  Was extubated but required intubation again due to altered mental status.  No further seizure activity noted.  Work up has included head CT and MRI of the brain.  Past Medical History  Diagnosis Date  . Coronary artery disease   . Hypertension   . Diabetes mellitus without complication     Past Surgical History  Procedure Laterality Date  . Cardiac catheterization N/A 01/23/2015    Procedure: Left Heart Cath and Coronary Angiography;  Surgeon: [name], MD;  Location: [facility] INVASIVE CV LAB CUPID;  Service: Cardiovascular;  Laterality: N/A;  . Cardiac catheterization N/A 01/23/2015    Procedure: Coronary Stent Intervention;  Surgeon: [name], MD;  Location: [facility] INVASIVE CV LAB CUPID;  Service: Cardiovascular;  Laterality: N/A;    Family history: Unable to obtain.  Patient intubated and sedated.    Social History:  reports that he has quit smoking. His smoking use included Cigarettes. He has never used smokeless tobacco. He reports that he drinks about 16.8 oz of alcohol per week. He reports that he does not use illicit drugs.  No Known Allergies  Medications:  I have reviewed the patient's current medications. Prior to Admission:  Prescriptions prior to admission  Medication Sig Dispense Refill Last Dose  . aspirin 325 MG tablet Take 325 mg by mouth every 6 (six) hours as needed for mild pain or moderate pain.   unknown   Scheduled: . antiseptic oral rinse  7 mL Mouth Rinse QID  . aspirin  81 mg Oral Daily  . atorvastatin  80  mg Per Tube q1800  . carvedilol  3.125 mg Oral BID WC  . chlorhexidine  15 mL Mouth Rinse BID  . famotidine  20 mg Per Tube BID  . folic acid  1 mg Per Tube Daily  . insulin aspart  2-6 Units Subcutaneous 6 times per day  . insulin glargine  30 Units Subcutaneous Q24H  . lidocaine (cardiac) 100 mg/58ml      . midazolam      . propofol      . succinylcholine      . thiamine  100 mg Per Tube Daily  . ticagrelor  90 mg Oral BID    ROS: Unable to obtain.  Patient intubated and sedated.    Physical Examination: Blood pressure 201/104, pulse 65, temperature 100.6 F (38.1 C), temperature source Oral, resp. rate 17, height 5\' 6"  (1.676 m), weight 72.5 kg (159 lb 13.3 oz), SpO2 99 %.  Mental Status: Patient does not respond to verbal stimuli.  Does not respond to deep sternal rub.  Does not follow commands.  No verbalizations noted.  Cranial Nerves: II: patient does not respond confrontation bilaterally, pupils right 2 mm, left 2 mm,and reactive bilaterally III,IV,VI: doll's response absent bilaterally.  V,VII: corneal reflex reduced bilaterally  VIII: patient does not respond to verbal stimuli IX,X: gag reflex reduced, XI: trapezius strength unable to test bilaterally XII: tongue strength unable to test Motor: Extremities flaccid throughout.  No spontaneous  movement noted.  No purposeful movements noted. Sensory: Does not respond to noxious stimuli in any extremity. Deep Tendon Reflexes:  1+ in the upper extremities and absent in the lower extremities. Plantars: mute bilaterally Cerebellar: Unable to perform    Laboratory Studies:   Basic Metabolic Panel:  Recent Labs Lab 01/22/15 2250 01/22/15 2319 01/23/15 0530 01/24/15 0349  NA 138 141 138 141  K 2.8* 2.8* 4.1 3.5  CL 97* 97* 101 109  CO2 22  --  26 24  GLUCOSE 487* 498* 478* 108*  BUN 22* 21* 23* 26*  CREATININE 1.56* 1.40* 1.66* 1.85*  CALCIUM 9.3  --  8.3* 8.4*    Liver Function Tests:  Recent Labs Lab  01/22/15 2250 01/23/15 0530  AST 34 274*  ALT 20 44  ALKPHOS 72 50  BILITOT 0.6 0.8  PROT 7.8 5.9*  ALBUMIN 4.6 3.4*   No results for input(s): LIPASE, AMYLASE in the last 168 hours. No results for input(s): AMMONIA in the last 168 hours.  CBC:  Recent Labs Lab 01/22/15 2250 01/22/15 2319 01/23/15 0530 01/24/15 0349  WBC 15.3*  --  13.4* 10.7*  NEUTROABS 11.1*  --   --   --   HGB 14.2 14.6 11.3* 10.8*  HCT 39.5 43.0 31.2* 30.6*  MCV 86.4  --  85.0 87.2  PLT 264  --  209 167    Cardiac Enzymes:  Recent Labs Lab 01/22/15 2250 01/23/15 0309 01/23/15 0530  TROPONINI 0.08* >65.00* >65.00*    BNP: Invalid input(s): POCBNP  CBG:  Recent Labs Lab 01/23/15 2033 01/24/15 0048 01/24/15 0425 01/24/15 0905 01/24/15 1348  GLUCAP 141* 129* 100* 162* 53    Microbiology: Results for orders placed or performed during the hospital encounter of 01/22/15  MRSA PCR Screening     Status: None   Collection Time: 01/23/15  4:24 AM  Result Value Ref Range Status   MRSA by PCR NEGATIVE NEGATIVE Final    Comment:        The GeneXpert MRSA Assay (FDA approved for NASAL specimens only), is one component of a comprehensive MRSA colonization surveillance program. It is not intended to diagnose MRSA infection nor to guide or monitor treatment for MRSA infections.     Coagulation Studies: No results for input(s): LABPROT, INR in the last 72 hours.  Urinalysis:  Recent Labs Lab 01/22/15 2335  COLORURINE STRAW*  LABSPEC 1.020  PHURINE 6.0  GLUCOSEU >1000*  HGBUR MODERATE*  BILIRUBINUR NEGATIVE  KETONESUR NEGATIVE  PROTEINUR 100*  UROBILINOGEN 0.2  NITRITE POSITIVE*  LEUKOCYTESUR NEGATIVE    Lipid Panel:     Component Value Date/Time   TRIG 99 01/23/2015 0530    HgbA1C: No results found for: HGBA1C  Urine Drug Screen:     Component Value Date/Time   LABOPIA NONE DETECTED 01/22/2015 2335   COCAINSCRNUR NONE DETECTED 01/22/2015 2335   LABBENZ  POSITIVE* 01/22/2015 2335   AMPHETMU NONE DETECTED 01/22/2015 2335   THCU NONE DETECTED 01/22/2015 2335   LABBARB NONE DETECTED 01/22/2015 2335    Alcohol Level:  Recent Labs Lab 01/22/15 2250  ETH <5    Other results: EKG: sinus rhythm at 94 bpm, STEMI.  Imaging: Ct Head Wo Contrast  01/23/2015   CLINICAL DATA:  Altered mental status  EXAM: CT HEAD WITHOUT CONTRAST  TECHNIQUE: Contiguous axial images were obtained from the base of the skull through the vertex without intravenous contrast.  COMPARISON:  None.  FINDINGS: There is no intracranial hemorrhage, mass  or evidence of acute infarction. There is moderate generalized atrophy. There is mild chronic microvascular ischemic change. There is no significant extra-axial fluid collection.  No acute intracranial findings are evident.  IMPRESSION: Moderate chronic atrophy and mild chronic small vessel changes. No acute intracranial findings. Mild motion degradation of the images.   Electronically Signed   By: Andreas Newport M.D.   On: 01/23/2015 00:33   Mr Brain Wo Contrast  01/24/2015   CLINICAL DATA:  Altered mental status, fell out of bed at 8:30 p.m., diaphoretic, witnessed seizure with incontinence. Possible alcohol withdrawal.  EXAM: MRI HEAD WITHOUT CONTRAST  TECHNIQUE: Multiplanar, multiecho pulse sequences of the brain and surrounding structures were obtained without intravenous contrast.  COMPARISON:  CT of the head Jan 23, 2015  FINDINGS: Multiple small foci of reduced diffusion and bifrontal lobes, bilateral occipital lobes, RIGHT parietal lobe and, RIGHT cerebellum, measuring up to 12 mm and RIGHT occipital lobe. Corresponding low ADC values. No susceptibility artifact to suggest hemorrhage. Ventricles and sulci are upper limits of normal in size for patient's age. Patchy white matter T2 hyperintensities noted, exclusive of the aforementioned abnormality.  No abnormal extra-axial fluid collections. Major intracranial vascular flow voids  seen at the skull base. Imaged ocular globes and orbital contents are unremarkable. Mild paranasal sinus mucosal thickening without air-fluid levels. Mild bilateral mastoid effusions. No abnormal sellar expansion. No cerebellar tonsillar ectopia. No abnormal calvarial bone marrow signal. Patient is edentulous.  IMPRESSION: Multiple small foci of acute ischemia spanning multiple vascular territories (involving the cerebrum and cerebellum) most consistent with embolic event.  Parenchymal brain volume loss, upper limits of normal for age. Minimal white matter changes suggest chronic small vessel ischemic disease.   Electronically Signed   By: Elon Alas   On: 01/24/2015 03:51   Dg Chest Port 1 View  01/24/2015   CLINICAL DATA:  Endotracheal tube placement  EXAM: PORTABLE CHEST - 1 VIEW  COMPARISON:  01/23/2015  FINDINGS: Cardiac shadow is mildly enlarged but stable. A nasogastric catheter is been removed in the interval. A right central venous line is seen in the mid superior vena cava. Endotracheal tube is noted 2.4 cm above the carina. Diffuse increased density noted throughout both lungs similar to that seen on the prior exam consistent with a degree of pulmonary edema.  IMPRESSION: Stable pulmonary edema.  Tubes and lines as described.   Electronically Signed   By: Inez Catalina M.D.   On: 01/24/2015 15:17   Dg Chest Port 1 View  01/23/2015   CLINICAL DATA:  Central line placement and gastric tube placement  EXAM: PORTABLE CHEST - 1 VIEW  COMPARISON:  None.  FINDINGS: The endotracheal tube tip is 3.5 cm above the carina. The gastric tube reaches the stomach but the side port is at or above the EG junction. The tube should be advanced for optimal placement. The right jugular central line extends into the SVC.  There is no pneumothorax. There is mild central ground-glass opacity without dense focal airspace consolidation. There is no large effusion.  IMPRESSION: Satisfactory ET tube position. Gastric tube  reaches the stomach but should be advanced for optimal placement. Central line is satisfactorily positioned. No pneumothorax.   Electronically Signed   By: Andreas Newport M.D.   On: 01/23/2015 05:31   Dg Chest Port 1 View  01/23/2015   CLINICAL DATA:  Seizure  EXAM: PORTABLE CHEST - 1 VIEW  COMPARISON:  None.  FINDINGS: Endotracheal tube 2.8 cm from carina. Normal  cardiac silhouette. There is a left lower lobe opacity posterior to the heart. No pleural fluid. No pneumothorax. No overt pulmonary edema.  IMPRESSION: 1. Endotracheal tube in good position. 2. Left lower lobe atelectasis versus infiltrate.   Electronically Signed   By: Suzy Bouchard M.D.   On: 01/23/2015 00:02     Assessment/Plan: 60 year old male presenting with altered mental status and seizures.  Although alcohol withdrawal is likely playing some part in his presentation, MRI of the brain personally reviewed and shows small acute infarcts bilaterally consistent with an embolic source.  EEG only significant for slowing.  Patient on no anticonvulsant therapy at this time.  This seems appropriate since seizure activity likely provoked (emboli and ETOH withdrawal).  Echocardiogram shows a depressed EF at 30-35%.  No intracardiac masses or thrombi were noted.  BP controlled.   Recommendations: 1. HgbA1c, fasting lipid panel 2. Carotid dopplers 3. Prophylactic therapy-Continue ASA daily 4. Telemetry monitoring 5. Frequent neuro checks 6. Seizure precautions  Alexis Goodell, MD [facility] [phone] 01/24/2015, 4:02 PM

## 2015-01-24 NOTE — Progress Notes (Signed)
[facility] Progress Note Patient Name: Timothy Lambert DOB: 01/26/1956 MRN: [id number]   Date of Service  01/24/2015  HPI/Events of Note  ogt crosses diaphragm  eICU Interventions  use     Intervention Category Minor Interventions: Communication with other healthcare providers and/or family;Clinical assessment - ordering diagnostic tests  Raylene Miyamoto. 01/24/2015, 9:40 PM

## 2015-01-24 NOTE — Progress Notes (Signed)
Transported patient to MRI while on the ventilator. Patient remained stable during transport  

## 2015-01-24 NOTE — Progress Notes (Signed)
Subjective: [name] was seen and examined this morning.  He is sedated and intubated.  There were no acute events noted overnight.  Objective: Vital signs in last 24 hours: Filed Vitals:   01/24/15 0700 01/24/15 0800 01/24/15 0804 01/24/15 0900  BP: 138/91 141/88 141/88 141/88  Pulse: 96 98 100 98  Temp:   99.8 F (37.7 C) 100 F (37.8 C)  TempSrc:   Oral   Resp: 20 21 22 20   Height:      Weight:      SpO2: 100% 100% 100% 100%   Weight change: 7 lb 13.3 oz (3.553 kg)  Intake/Output Summary (Last 24 hours) at 01/24/15 [id number] Last data filed at 01/24/15 0700  Gross per 24 hour  Intake 884.45 ml  Output    985 ml  Net -100.55 ml   General: sedated and intubated HEENT: [employer]/AT, ETT in place Cardiac: RRR, HR a little fast at 99; no rubs, murmurs or gallops; R radial artery bandage is c/d/i; 2+ radial/DPs B/L Pulm: coarse BS anteriorly, difficult to hear over the vent; SpO2 100% on vent with FiO2 40% Abd: soft, nontender, nondistended, BS present GU:  90cc yellow urine in Foley bag Ext: warm and well perfused, no pedal edema Neuro: sedated initially; RN performed wake-up assessment and he opened eyes when his name was called; slight muscle movement but unable to squeeze my hand.  Lab Results: Basic Metabolic Panel:  Recent Labs Lab 01/23/15 0530 01/24/15 0349  NA 138 141  K 4.1 3.5  CL 101 109  CO2 26 24  GLUCOSE 478* 108*  BUN 23* 26*  CREATININE 1.66* 1.85*  CALCIUM 8.3* 8.4*   Liver Function Tests:  Recent Labs Lab 01/22/15 2250 01/23/15 0530  AST 34 274*  ALT 20 44  ALKPHOS 72 50  BILITOT 0.6 0.8  PROT 7.8 5.9*  ALBUMIN 4.6 3.4*   CBC:  Recent Labs Lab 01/22/15 2250  01/23/15 0530 01/24/15 0349  WBC 15.3*  --  13.4* 10.7*  NEUTROABS 11.1*  --   --   --   HGB 14.2  < > 11.3* 10.8*  HCT 39.5  < > 31.2* 30.6*  MCV 86.4  --  85.0 87.2  PLT 264  --  209 167  < > = values in this interval not displayed. Cardiac Enzymes:  Recent Labs Lab  01/22/15 2250 01/23/15 0309 01/23/15 0530  TROPONINI 0.08* >65.00* >65.00*   CBG:  Recent Labs Lab 01/23/15 1728 01/23/15 1829 01/23/15 1934 01/23/15 2033 01/24/15 0048 01/24/15 0425  GLUCAP 198* 167* 149* 141* 129* 100*   Fasting Lipid Panel:  Recent Labs Lab 01/23/15 0530  TRIG 99   Studies/Results: Ct Head Wo Contrast  01/23/2015   CLINICAL DATA:  Altered mental status  EXAM: CT HEAD WITHOUT CONTRAST  TECHNIQUE: Contiguous axial images were obtained from the base of the skull through the vertex without intravenous contrast.  COMPARISON:  None.  FINDINGS: There is no intracranial hemorrhage, mass or evidence of acute infarction. There is moderate generalized atrophy. There is mild chronic microvascular ischemic change. There is no significant extra-axial fluid collection.  No acute intracranial findings are evident.  IMPRESSION: Moderate chronic atrophy and mild chronic small vessel changes. No acute intracranial findings. Mild motion degradation of the images.   Electronically Signed   By: Andreas Newport M.D.   On: 01/23/2015 00:33   Mr Brain Wo Contrast  01/24/2015   CLINICAL DATA:  Altered mental status, fell out of bed  at 8:30 p.m., diaphoretic, witnessed seizure with incontinence. Possible alcohol withdrawal.  EXAM: MRI HEAD WITHOUT CONTRAST  TECHNIQUE: Multiplanar, multiecho pulse sequences of the brain and surrounding structures were obtained without intravenous contrast.  COMPARISON:  CT of the head Jan 23, 2015  FINDINGS: Multiple small foci of reduced diffusion and bifrontal lobes, bilateral occipital lobes, RIGHT parietal lobe and, RIGHT cerebellum, measuring up to 12 mm and RIGHT occipital lobe. Corresponding low ADC values. No susceptibility artifact to suggest hemorrhage. Ventricles and sulci are upper limits of normal in size for patient's age. Patchy white matter T2 hyperintensities noted, exclusive of the aforementioned abnormality.  No abnormal extra-axial fluid  collections. Major intracranial vascular flow voids seen at the skull base. Imaged ocular globes and orbital contents are unremarkable. Mild paranasal sinus mucosal thickening without air-fluid levels. Mild bilateral mastoid effusions. No abnormal sellar expansion. No cerebellar tonsillar ectopia. No abnormal calvarial bone marrow signal. Patient is edentulous.  IMPRESSION: Multiple small foci of acute ischemia spanning multiple vascular territories (involving the cerebrum and cerebellum) most consistent with embolic event.  Parenchymal brain volume loss, upper limits of normal for age. Minimal white matter changes suggest chronic small vessel ischemic disease.   Electronically Signed   By: Elon Alas   On: 01/24/2015 03:51   Dg Chest Port 1 View  01/23/2015   CLINICAL DATA:  Central line placement and gastric tube placement  EXAM: PORTABLE CHEST - 1 VIEW  COMPARISON:  None.  FINDINGS: The endotracheal tube tip is 3.5 cm above the carina. The gastric tube reaches the stomach but the side port is at or above the EG junction. The tube should be advanced for optimal placement. The right jugular central line extends into the SVC.  There is no pneumothorax. There is mild central ground-glass opacity without dense focal airspace consolidation. There is no large effusion.  IMPRESSION: Satisfactory ET tube position. Gastric tube reaches the stomach but should be advanced for optimal placement. Central line is satisfactorily positioned. No pneumothorax.   Electronically Signed   By: Andreas Newport M.D.   On: 01/23/2015 05:31   Dg Chest Port 1 View  01/23/2015   CLINICAL DATA:  Seizure  EXAM: PORTABLE CHEST - 1 VIEW  COMPARISON:  None.  FINDINGS: Endotracheal tube 2.8 cm from carina. Normal cardiac silhouette. There is a left lower lobe opacity posterior to the heart. No pleural fluid. No pneumothorax. No overt pulmonary edema.  IMPRESSION: 1. Endotracheal tube in good position. 2. Left lower lobe atelectasis  versus infiltrate.   Electronically Signed   By: Suzy Bouchard M.D.   On: 01/23/2015 00:02   Medications: I have reviewed the patient's current medications. Scheduled Meds: . antiseptic oral rinse  7 mL Mouth Rinse QID  . aspirin  81 mg Oral Daily  . atorvastatin  80 mg Per Tube q1800  . carvedilol  3.125 mg Oral BID WC  . chlorhexidine  15 mL Mouth Rinse BID  . famotidine  20 mg Per Tube BID  . folic acid  1 mg Per Tube Daily  . insulin aspart  2-6 Units Subcutaneous 6 times per day  . insulin glargine  30 Units Subcutaneous Q24H  . pneumococcal 23 valent vaccine  0.5 mL Intramuscular Tomorrow-1000  . thiamine  100 mg Per Tube Daily  . ticagrelor  90 mg Oral BID   Continuous Infusions: . dexmedetomidine    . dextrose    . dextrose 5 % and 0.9% NaCl 30 mL/hr at 01/24/15 1000  .  heparin 850 Units/hr (01/24/15 1128)  . nitroGLYCERIN     PRN Meds:.sodium chloride, acetaminophen, dextrose, docusate, LORazepam, metoprolol, midazolam, ondansetron (ZOFRAN) IV   Assessment/Plan:  STEMI:  Due to 99% LAD stenosis.  S/p PCI with DES to LAD on 01/23/15. - monitor for MI adverse events (i.e. LV rupture, arrhythmias, acute HF) - awaiting 2D ECHO - d/c metoprolol q6h - START Coreg 3.125mg  BID - will wait for drop in Cr before initiating ACEI - continue ASA and Brilinta x at least 12 months - continue Lipitor  Hypertensive emergency:  Resolved. BPs stable. - allow permissive HTN given acute CVA - will need appropriate HTN trx titrated after discharge  Acute CVA, likely embolic:  MRI reveals multiple small foci of acute ischemia c/w embolic event.   - Neuro consulted and recommendations appreciated. - awaiting 2D ECHO to look for cardiac source (ie LV thrombus) - resume heparin gtt for A/C in the setting of probable cardioembolic CVA - Ok to continue Brilinta and ASA  VDRF:  Per PCCM - will need Lasix if extubation is imminent    LOS: 1 day   Timothy Oman, DO IMTS,  PGY2 01/24/2015, 9:22 AM  I have seen and examined the patient along with Timothy Oman, DO.  I have reviewed the chart, notes and new data.  I agree with her note.  Key new complaints: starting to wake up and move, not fully alert Key examination changes: no overt CHF by physical exam, rare PVCs Key new findings / data: CT findings raise concern for embolic strokes; ECG suggests probable LV apical dyskinesis - future aneurysm formation, high suspicion for LV thrombus. Very long QT/anterior ischemia-stunning pattern.  PLAN: The individual areas of stroke are small, low likelihood for hemorrhagic transformation. The risk of another embolism is high. Recommend IV heparin and transition to warfarin. However, not sure he is a great long term warfarin candidate due to alcoholism. Ideally, anticoagulate for one year. High risk for ventricular arrhythmia: both TdP in short term and monomorphic VT long term. If Echo EF<35% (which is likely) will recommend LifeVest and evaluation for ICD in 90 days.  ACEi, beta blcokers are important, but will wait to see if renal function improves before starting ACEi. CXR equivocal for fluid overload. Would recommend diuretics if extubation is planned, otherwise again would like to see renal function improvement.  Timothy Klein, MD, [place] [phone] 01/24/2015, 12:08 PM

## 2015-01-24 NOTE — Progress Notes (Signed)
Pt reintubated by Marni Griffon NP; VS improving per flowsheet; propofol gtt restarted per NP verbal orders; lung sounds auscultated x 4; CXR completed; family aware via phone call by Dr. Halford Chessman; will continue to closely monitor

## 2015-01-24 NOTE — Progress Notes (Signed)
ANTICOAGULATION CONSULT NOTE Pharmacy Consult for Heparin Indication: stroke  No Known Allergies  Patient Measurements: Height: 5\' 6"  (167.6 cm) Weight: 159 lb 13.3 oz (72.5 kg) IBW/kg (Calculated) : 63.8  Vital Signs: Temp: 100.8 F (38.2 C) (05/05 1600) Temp Source: Oral (05/05 0804) BP: 123/77 mmHg (05/05 1600) Pulse Rate: 106 (05/05 1600)  Labs:  Recent Labs  01/22/15 2250 01/22/15 2319 01/23/15 0309 01/23/15 0530 01/24/15 0349 01/24/15 1800  HGB 14.2 14.6  --  11.3* 10.8*  --   HCT 39.5 43.0  --  31.2* 30.6*  --   PLT 264  --   --  209 167  --   HEPARINUNFRC  --   --   --   --   --  0.17*  CREATININE 1.56* 1.40*  --  1.66* 1.85* 1.70*  TROPONINI 0.08*  --  >65.00* >65.00*  --   --     Estimated Creatinine Clearance: 42.7 mL/min (by C-G formula based on Cr of 1.7).   Medical History: Past Medical History  Diagnosis Date  . Coronary artery disease   . Hypertension   . Diabetes mellitus without complication     Medications:  Scheduled:  . antiseptic oral rinse  7 mL Mouth Rinse QID  . aspirin  81 mg Oral Daily  . atorvastatin  80 mg Per Tube q1800  . carvedilol  3.125 mg Oral BID WC  . chlorhexidine  15 mL Mouth Rinse BID  . famotidine  20 mg Per Tube BID  . folic acid  1 mg Per Tube Daily  . insulin aspart  2-6 Units Subcutaneous 6 times per day  . insulin glargine  30 Units Subcutaneous Q24H  . lidocaine (cardiac) 100 mg/34ml      . midazolam      . propofol      . succinylcholine      . thiamine  100 mg Per Tube Daily  . ticagrelor  90 mg Oral BID   Infusions:  . dexmedetomidine Stopped (01/24/15 1450)  . dextrose    . dextrose 5 % and 0.9% NaCl 30 mL/hr at 01/24/15 1000  . heparin 850 Units/hr (01/24/15 1128)  . nitroGLYCERIN    . propofol (DIPRIVAN) infusion 40 mcg/kg/min (01/24/15 1500)    Assessment: Timothy Lambert is an 59 y.o. male transferred from [facility] admitted on 01/22/2015 presenting with seizures.  Patient extremely agitated  during intubation for airway protection.  EKG revealed pre-cordial ST changes concerning for infarct.  Transferred to [facility] ICU to undergo emergent cath once hemodynamically stabilized.  MRI 5/4 revealing acute scattered small strokes consistent with embolic process.  Pharmacy has been consulted to dose Heparin.    Hgb low trending down, plt wnl.  CT neg for hemorrhage.  No other bleeding reported at this time.    Initial heparin level = 0.17  Goal of Therapy:  Heparin level 0.3-0.5 units/ml Monitor platelets by anticoagulation protocol: Yes   Plan:  - Heparin to 1000 units / hr - Daily heparin level and CBC - Monitor for signs and symptoms of bleeding  Thank you. Anette Guarneri, PharmD [phone]  01/24/2015 7:34 PM

## 2015-01-25 ENCOUNTER — Inpatient Hospital Stay (HOSPITAL_COMMUNITY): Payer: Medicaid Other

## 2015-01-25 DIAGNOSIS — I639 Cerebral infarction, unspecified: Secondary | ICD-10-CM | POA: Diagnosis present

## 2015-01-25 LAB — GLUCOSE, CAPILLARY
GLUCOSE-CAPILLARY: 136 mg/dL — AB (ref 70–99)
GLUCOSE-CAPILLARY: 96 mg/dL (ref 70–99)
Glucose-Capillary: 132 mg/dL — ABNORMAL HIGH (ref 70–99)
Glucose-Capillary: 91 mg/dL (ref 70–99)
Glucose-Capillary: 110 mg/dL — ABNORMAL HIGH (ref 70–99)

## 2015-01-25 LAB — BASIC METABOLIC PANEL
Anion gap: 9 (ref 5–15)
BUN: 25 mg/dL — ABNORMAL HIGH (ref 6–20)
CHLORIDE: 110 mmol/L (ref 101–111)
CO2: 22 mmol/L (ref 22–32)
Calcium: 8.2 mg/dL — ABNORMAL LOW (ref 8.9–10.3)
Creatinine, Ser: 1.61 mg/dL — ABNORMAL HIGH (ref 0.61–1.24)
GFR calc Af Amer: 53 mL/min — ABNORMAL LOW (ref >60)
GFR, EST NON AFRICAN AMERICAN: 46 mL/min — AB (ref >60)
GLUCOSE: 136 mg/dL — AB (ref 70–99)
POTASSIUM: 3.5 mmol/L (ref 3.5–5.1)
Sodium: 141 mmol/L (ref 135–145)

## 2015-01-25 LAB — CBC
HCT: 30.1 % — ABNORMAL LOW (ref 39.0–52.0)
Hemoglobin: 10.7 g/dL — ABNORMAL LOW (ref 13.0–17.0)
MCH: 31.5 pg (ref 26.0–34.0)
MCHC: 35.5 g/dL (ref 30.0–36.0)
MCV: 88.5 fL (ref 78.0–100.0)
PLATELETS: 168 10*3/uL (ref 150–400)
RBC: 3.4 MIL/uL — AB (ref 4.22–5.81)
RDW: 14.1 % (ref 11.5–15.5)
WBC: 10.9 10*3/uL — AB (ref 4.0–10.5)

## 2015-01-25 LAB — HEPARIN LEVEL (UNFRACTIONATED)
Heparin Unfractionated: 0.28 IU/mL — ABNORMAL LOW (ref 0.30–0.70)
Heparin Unfractionated: 0.21 IU/mL — ABNORMAL LOW (ref 0.30–0.70)
Heparin Unfractionated: 0.27 IU/mL — ABNORMAL LOW (ref 0.30–0.70)

## 2015-01-25 LAB — PHOSPHORUS: Phosphorus: 3.9 mg/dL (ref 2.5–4.6)

## 2015-01-25 LAB — MAGNESIUM: MAGNESIUM: 1.9 mg/dL (ref 1.7–2.4)

## 2015-01-25 MED ORDER — FUROSEMIDE 10 MG/ML IJ SOLN
40.0000 mg | Freq: Four times a day (QID) | INTRAMUSCULAR | Status: AC
  Administered 2015-01-25 – 2015-01-26 (×3): 40 mg via INTRAVENOUS
  Filled 2015-01-25 (×3): qty 4

## 2015-01-25 MED ORDER — VITAL AF 1.2 CAL PO LIQD
1000.0000 mL | ORAL | Status: DC
  Administered 2015-01-25 – 2015-01-26 (×4): 1000 mL
  Filled 2015-01-25 (×9): qty 1000

## 2015-01-25 MED ORDER — FUROSEMIDE 10 MG/ML IJ SOLN: 40.0000 mg | Freq: Two times a day (BID) | INTRAMUSCULAR | Status: DC

## 2015-01-25 MED ORDER — POTASSIUM CHLORIDE 20 MEQ/15ML (10%) PO SOLN
40.0000 meq | Freq: Three times a day (TID) | ORAL | Status: AC
  Administered 2015-01-25 (×2): 40 meq
  Filled 2015-01-25 (×4): qty 30

## 2015-01-25 MED ORDER — VITAL HIGH PROTEIN PO LIQD: 1000.0000 mL | ORAL | Status: DC

## 2015-01-25 MED ORDER — LEVETIRACETAM IN NACL 1000 MG/100ML IV SOLN
1000.0000 mg | Freq: Once | INTRAVENOUS | Status: AC
  Administered 2015-01-25: 1000 mg via INTRAVENOUS
  Filled 2015-01-25: qty 100

## 2015-01-25 MED ORDER — LEVETIRACETAM IN NACL 500 MG/100ML IV SOLN
500.0000 mg | Freq: Two times a day (BID) | INTRAVENOUS | Status: DC
  Administered 2015-01-26 (×2): 500 mg via INTRAVENOUS
  Filled 2015-01-25 (×3): qty 100

## 2015-01-25 MED ORDER — HEPARIN (PORCINE) IN NACL 100-0.45 UNIT/ML-% IJ SOLN
1500.0000 [IU]/h | INTRAMUSCULAR | Status: DC
Start: 2015-01-25 — End: 2015-01-29
  Administered 2015-01-25: 1250 [IU]/h via INTRAVENOUS
  Administered 2015-01-27 – 2015-01-29 (×4): 1500 [IU]/h via INTRAVENOUS
  Filled 2015-01-25 (×12): qty 250

## 2015-01-25 MED ORDER — FUROSEMIDE 10 MG/ML IJ SOLN: 40.0000 mg | Freq: Once | INTRAMUSCULAR | Status: DC

## 2015-01-25 MED ORDER — METOLAZONE 5 MG PO TABS
5.0000 mg | ORAL_TABLET | Freq: Every day | ORAL | Status: AC
  Administered 2015-01-25: 5 mg via ORAL
  Filled 2015-01-25: qty 1

## 2015-01-25 NOTE — Clinical Documentation Improvement (Signed)
"  Acute decompensated HF' is documented in the cardiology progress note 01/25/15.  EF 30 to 35% per Echo this admission.   Please document the TYPE of Heart Failure:   - Systolic  - Diastolic  - Combined  - Unable to clinically determine  Thank You, Erling Conte ,RN Clinical Documentation Specialist:  [employer]

## 2015-01-25 NOTE — Progress Notes (Signed)
Sedation suspended and vent weaning attempted by resp therapist per CCM Yacoub verbal orders; pt tolerated initially, but increasing heartrate, respiratory rate, and blood pressure ensued; pt noted to be fidgety, restless, anxious appearing with arms in air and purposeful movement to ET tube; vent weaning discontinued by RT and sedation restarted; family at bedside; questions encouraged and answered; will continue to monitor

## 2015-01-25 NOTE — Progress Notes (Signed)
PULMONARY / CRITICAL CARE MEDICINE   Name: Timothy Lambert MRN: [id number] DOB: March 21, 1956    ADMISSION DATE:  01/22/2015 CONSULTATION DATE:  01/25/2015  REFERRING MD :  EDP  CHIEF COMPLAINT:  Seizures  INITIAL PRESENTATION:  59 y.o. M brought to [facility] ED 5/4 with seizures.  In ED, was altered and extremely agitated requiring intubation for airway protection and further workup .  EKG with STEMI. Taken urgently to cath and PTCI to LAD. Back to ICU on MV   STUDIES:  CXR 5/4 >>> LLL atx vs infiltrate. CT head 5/4 >>> no acute findings, chronic atrophy and small vessel changes. EEG 5/4 >> no seizure focus or active seizures MRI brain 5/4 >> multiple small foci of acute ischemia in cerebrum and cerebellum, suggestive of embolic process.  TTE 5/5 >>   SIGNIFICANT EVENTS: 5/4 - admitted with seizures, intubated for airway protection and further workup, EKG findings concerning for ischemia.  Transferred to [facility] ICU for further evaluation and management. 5/5 extubated then flash edema and promptly reintubated.  SUBJECTIVE:  Awake and weaning off sedation on vent this AM but not following all commands.  VITAL SIGNS: Temp:  [99 F (37.2 C)-101.5 F (38.6 C)] 99 F (37.2 C) (05/06 1015) Pulse Rate:  [39-140] 104 (05/06 1015) Resp:  [16-35] 18 (05/06 1015) BP: (102-221)/(67-148) 149/97 mmHg (05/06 1015) SpO2:  [94 %-100 %] 99 % (05/06 1015) FiO2 (%):  [60 %] 60 % (05/06 0442) Weight:  [73.2 kg (161 lb 6 oz)] 73.2 kg (161 lb 6 oz) (05/06 0155) HEMODYNAMICS:   VENTILATOR SETTINGS: Vent Mode:  [-] PRVC FiO2 (%):  [60 %] 60 % Set Rate:  [16 bmp] 16 bmp Vt Set:  [510 mL] 510 mL PEEP:  [5 cmH20] 5 cmH20 Plateau Pressure:  [16 cmH20-21 cmH20] 16 cmH20 INTAKE / OUTPUT: Intake/Output      05/05 0701 - 05/06 0700 05/06 0701 - 05/07 0700   I.V. (mL/kg) 1265.2 (17.3) 112.4 (1.5)   NG/GT 150    IV Piggyback     Total Intake(mL/kg) 1415.2 (19.3) 112.4 (1.5)   Urine (mL/kg/hr) 655 (0.4) 105 (0.4)    Stool     Total Output 655 105   Net +760.2 +7.4         PHYSICAL EXAMINATION: General: Chronically ill appearing male, in NAD. Neuro: Awake and following simple commands on vent. HEENT: [place]/AT. PERRL, sclerae anicteric. Cardiovascular: RRR, no M/R/G.  Lungs: Respirations even and unlabored.  Slightly diminished but otherwise CTA bilaterally, No W/R/R. Abdomen: BS x 4, soft, NT/ND.  Musculoskeletal: No gross deformities, no edema.  Skin: Intact, warm, no rashes.  LABS:  CBC  Recent Labs Lab 01/23/15 0530 01/24/15 0349 01/25/15 0400  WBC 13.4* 10.7* 10.9*  HGB 11.3* 10.8* 10.7*  HCT 31.2* 30.6* 30.1*  PLT 209 167 168   Coag's No results for input(s): APTT, INR in the last 168 hours. BMET  Recent Labs Lab 01/24/15 0349 01/24/15 1800 01/25/15 0400  NA 141 142 141  K 3.5 3.6 3.5  CL 109 109 110  CO2 24 23 22   BUN 26* 24* 25*  CREATININE 1.85* 1.70* 1.61*  GLUCOSE 108* 133* 136*   Electrolytes  Recent Labs Lab 01/24/15 0349 01/24/15 1800 01/25/15 0400  CALCIUM 8.4* 8.4* 8.2*  MG  --   --  1.9  PHOS  --   --  3.9   Sepsis Markers  Recent Labs Lab 01/22/15 2319 01/23/15 0530 01/23/15 0758  LATICACIDVEN 11.02* 2.3* 2.9*  ABG  Recent Labs Lab 01/23/15 0045  PHART 7.301*  PCO2ART 56.6*  PO2ART 159.0*   Liver Enzymes  Recent Labs Lab 01/22/15 2250 01/23/15 0530  AST 34 274*  ALT 20 44  ALKPHOS 72 50  BILITOT 0.6 0.8  ALBUMIN 4.6 3.4*   Cardiac Enzymes  Recent Labs Lab 01/22/15 2250 01/23/15 0309 01/23/15 0530  TROPONINI 0.08* >65.00* >65.00*   Glucose  Recent Labs Lab 01/24/15 0905 01/24/15 1348 01/24/15 1824 01/24/15 2006 01/24/15 2329 01/25/15 0356  GLUCAP 162* 98 127* 94 162* 136*   Imaging Mr Brain Wo Contrast  01/24/2015   CLINICAL DATA:  Altered mental status, fell out of bed at 8:30 p.m., diaphoretic, witnessed seizure with incontinence. Possible alcohol withdrawal.  EXAM: MRI HEAD WITHOUT CONTRAST  TECHNIQUE:  Multiplanar, multiecho pulse sequences of the brain and surrounding structures were obtained without intravenous contrast.  COMPARISON:  CT of the head Jan 23, 2015  FINDINGS: Multiple small foci of reduced diffusion and bifrontal lobes, bilateral occipital lobes, RIGHT parietal lobe and, RIGHT cerebellum, measuring up to 12 mm and RIGHT occipital lobe. Corresponding low ADC values. No susceptibility artifact to suggest hemorrhage. Ventricles and sulci are upper limits of normal in size for patient's age. Patchy white matter T2 hyperintensities noted, exclusive of the aforementioned abnormality.  No abnormal extra-axial fluid collections. Major intracranial vascular flow voids seen at the skull base. Imaged ocular globes and orbital contents are unremarkable. Mild paranasal sinus mucosal thickening without air-fluid levels. Mild bilateral mastoid effusions. No abnormal sellar expansion. No cerebellar tonsillar ectopia. No abnormal calvarial bone marrow signal. Patient is edentulous.  IMPRESSION: Multiple small foci of acute ischemia spanning multiple vascular territories (involving the cerebrum and cerebellum) most consistent with embolic event.  Parenchymal brain volume loss, upper limits of normal for age. Minimal white matter changes suggest chronic small vessel ischemic disease.   Electronically Signed   By: Elon Alas   On: 01/24/2015 03:51   Dg Chest Port 1 View  01/24/2015   CLINICAL DATA:  Endotracheal tube placement  EXAM: PORTABLE CHEST - 1 VIEW  COMPARISON:  01/23/2015  FINDINGS: Cardiac shadow is mildly enlarged but stable. A nasogastric catheter is been removed in the interval. A right central venous line is seen in the mid superior vena cava. Endotracheal tube is noted 2.4 cm above the carina. Diffuse increased density noted throughout both lungs similar to that seen on the prior exam consistent with a degree of pulmonary edema.  IMPRESSION: Stable pulmonary edema.  Tubes and lines as  described.   Electronically Signed   By: Inez Catalina M.D.   On: 01/24/2015 15:17   Dg Abd Portable 1v  01/24/2015   CLINICAL DATA:  NG tube placement  EXAM: PORTABLE ABDOMEN - 1 VIEW  COMPARISON:  Prior film same day  FINDINGS: There is NG tube with tip in left upper quadrant medially probable within proximal stomach. The tip of the NG tube is coilled.  IMPRESSION: NG tube with tip coiled in left upper quadrant medially probable within proximal stomach.   Electronically Signed   By: Lahoma Crocker M.D.   On: 01/24/2015 21:25   Dg Abd Portable 1v  01/24/2015   CLINICAL DATA:  Orogastric tube placement  EXAM: PORTABLE ABDOMEN - 1 VIEW  COMPARISON:  01/24/2015 3:08 p.m.  FINDINGS: Endotracheal tube and right neck catheter are in stable position. There is a new orogastric tube which is coiled in the distal esophagus with tip still at the level of  the lower esophagus.  The upper abdominal bowel gas pattern is nonobstructive. Haziness of the lower chest, with further evaluation limited by extensive respiratory motion.  These results will be called to the ordering clinician or representative by the Radiologist Assistant, and communication documented in the PACS or zVision Dashboard.  IMPRESSION: The new orogastric tube is coiled in the distal esophagus. Recommend repositioning.   Electronically Signed   By: Monte Fantasia M.D.   On: 01/24/2015 19:19   ASSESSMENT / PLAN:  CARDIOVASCULAR A:  Hypertensive emergency - initial SBP 230 on EMS arrival and down to 167 on ED arrival.  HTN could have potentially attributed to his seizure (vs ETOH withdrawal) STEMI - EKG with ST elevation in precordial leads with t wave inversions concerning for anteroseptal infarct S/p PTCI to LAD ? possible endocarditis given concern for neurological embolic event P:  Nitro gtt available prn  Heparin gtt completed, on brilinta ASA, metoprolol. Lipitor TTE pending to look for valvular disease, PFO, as well as LV  fxn  PULMONARY OETT 5/4 >>> A: VDRF following GTC seizure, acute MI P:   Full mechanical support, PS but no extubation given yesterday's events. VAP bundle. Goal move to SBT 5/5 am after diureses today. Diureses as below. Follow ABG and CXR  RENAL A:   AGMA - lactate, improved AKI - unknown baseline renal function, progressive 5/5 but with adequate UOP Hypokalemia, resolved P:   KVO IVF Lasix 40 mg IV q6 x3 doses. K repletion as needed Follow BMP  GASTROINTESTINAL A:   GI prophylaxis Nutrition P:   SUP: Famotidine. Resume TF.  HEMATOLOGIC A:   VTE Prophylaxis P:  Heparin sq Follow CBC intermittently   INFECTIOUS A:   No indication of infection P:   Monitor clinically.  ENDOCRINE A:   DM - not on outpatient meds P:   SSI   NEUROLOGIC A:   Acute metabolic encephalopathy Seizure disorder - no hx of seizures.  Likely related to ETOH withdrawal vs less likely HTN emergency (initial head CT negative for any acute process) Acute scattered small strokes consistent with embolic process ETOH abuse UDS noted positive for benzo's - unsure pt's hx but likely that he received benzos from EMS prior to UDS collection P:   Sedation: Propofol gtt / Versed PRN. At risk for EtOH withdrawal as sedating gtts are weaned to off, will need CIWA coverage RASS goal: 0 to -1. Daily WUA. Keppra scheduled, Ativan PRN. Neurology following On ASA, no apparent contraindication to brilinta in setting CVA's > would appreciate neurology's comments about this  Thiamine / Folate.  Family updated: No family bedside.  Interdisciplinary Family Meeting v Palliative Care Meeting:  Due by: 01/29/15.  The patient is critically ill with multiple organ systems failure and requires high complexity decision making for assessment and support, frequent evaluation and titration of therapies, application of advanced monitoring technologies and extensive interpretation of multiple databases.    Critical Care Time devoted to patient care services described in this note is  35  Minutes. This time reflects time of care of this signee Dr Jennet Maduro. This critical care time does not reflect procedure time, or teaching time or supervisory time of PA/NP/Med student/Med Resident etc but could involve care discussion time.  Rush Farmer, M.D. [facility] Pulmonary/Critical Care Medicine. Pager: [phone]. After hours pager: [phone].

## 2015-01-25 NOTE — Progress Notes (Signed)
Subjective: Events of yesterday afternoon noted.  The patient was extubated and transitioned to 4L via [place] late yesterday morning.  He subsequently became hypoxic requiring re-intubation.    Timothy Lambert was seen and examined this morning.  He is sedated and intubated.    Objective: Vital signs in last 24 hours: Filed Vitals:   01/25/15 0442 01/25/15 0500 01/25/15 0600 01/25/15 0700  BP: 122/71 102/75 117/97 111/76  Pulse: 94 93 94 92  Temp: 99.7 F (37.6 C) 99.7 F (37.6 C) 99.3 F (37.4 C) 99.5 F (37.5 C)  TempSrc:      Resp: 18 20 16 18   Height:      Weight:      SpO2: 100% 100% 100% 100%   Weight change: 1 lb 8.7 oz (0.7 kg)  Intake/Output Summary (Last 24 hours) at 01/25/15 0737 Last data filed at 01/25/15 0700  Gross per 24 hour  Intake 1415.23 ml  Output    655 ml  Net 760.23 ml   General: sedated and intubated HEENT: [place]/AT, ETT in place Cardiac: irregular rhythm, HR 90s; +S3 gallop, no rubs or murmus; R radial artery bandage is c/d/i; 2+ radial/DPs B/L Pulm: good breath sounds; SpO2 100% on vent with FiO2 60% Abd: soft, nontender, nondistended GU:  15cc yellow urine in Foley bag Ext: 2+ DPs, no pedal edema Neuro: sedated and intubated  Lab Results: Basic Metabolic Panel:  Recent Labs Lab 01/24/15 1800 01/25/15 0400  NA 142 141  K 3.6 3.5  CL 109 110  CO2 23 22  GLUCOSE 133* 136*  BUN 24* 25*  CREATININE 1.70* 1.61*  CALCIUM 8.4* 8.2*  MG  --  1.9  PHOS  --  3.9   Liver Function Tests:  Recent Labs Lab 01/22/15 2250 01/23/15 0530  AST 34 274*  ALT 20 44  ALKPHOS 72 50  BILITOT 0.6 0.8  PROT 7.8 5.9*  ALBUMIN 4.6 3.4*   CBC:  Recent Labs Lab 01/22/15 2250  01/24/15 0349 01/25/15 0400  WBC 15.3*  < > 10.7* 10.9*  NEUTROABS 11.1*  --   --   --   HGB 14.2  < > 10.8* 10.7*  HCT 39.5  < > 30.6* 30.1*  MCV 86.4  < > 87.2 88.5  PLT 264  < > 167 168  < > = values in this interval not displayed. Cardiac Enzymes:  Recent Labs Lab  01/22/15 2250 01/23/15 0309 01/23/15 0530  TROPONINI 0.08* >65.00* >65.00*   CBG:  Recent Labs Lab 01/24/15 0905 01/24/15 1348 01/24/15 1824 01/24/15 2006 01/24/15 2329 01/25/15 0356  GLUCAP 162* 98 127* 94 162* 136*   Fasting Lipid Panel:  Recent Labs Lab 01/23/15 0530  TRIG 99   Studies/Results: Mr Brain Wo Contrast  01/24/2015   CLINICAL DATA:  Altered mental status, fell out of bed at 8:30 p.m., diaphoretic, witnessed seizure with incontinence. Possible alcohol withdrawal.  EXAM: MRI HEAD WITHOUT CONTRAST  TECHNIQUE: Multiplanar, multiecho pulse sequences of the brain and surrounding structures were obtained without intravenous contrast.  COMPARISON:  CT of the head Jan 23, 2015  FINDINGS: Multiple small foci of reduced diffusion and bifrontal lobes, bilateral occipital lobes, RIGHT parietal lobe and, RIGHT cerebellum, measuring up to 12 mm and RIGHT occipital lobe. Corresponding low ADC values. No susceptibility artifact to suggest hemorrhage. Ventricles and sulci are upper limits of normal in size for patient's age. Patchy white matter T2 hyperintensities noted, exclusive of the aforementioned abnormality.  No abnormal extra-axial fluid collections.  Major intracranial vascular flow voids seen at the skull base. Imaged ocular globes and orbital contents are unremarkable. Mild paranasal sinus mucosal thickening without air-fluid levels. Mild bilateral mastoid effusions. No abnormal sellar expansion. No cerebellar tonsillar ectopia. No abnormal calvarial bone marrow signal. Patient is edentulous.  IMPRESSION: Multiple small foci of acute ischemia spanning multiple vascular territories (involving the cerebrum and cerebellum) most consistent with embolic event.  Parenchymal brain volume loss, upper limits of normal for age. Minimal white matter changes suggest chronic small vessel ischemic disease.   Electronically Signed   By: Elon Alas   On: 01/24/2015 03:51   Dg Chest Port 1  View  01/25/2015   CLINICAL DATA:  Respiratory failure  EXAM: PORTABLE CHEST - 1 VIEW  COMPARISON:  01/24/2015  FINDINGS: The endotracheal tube tip is 3.4 cm above the carina. The right jugular central line extends into the SVC just below the azygos vein junction. The nasogastric tube extends into the stomach. There is improvement, with partial clearance of central and basilar airspace opacities. Mild consolidation persists in the left base. There is no pneumothorax.  IMPRESSION: Support equipment appears satisfactorily positioned.  Improved, with partial clearance of central and basilar airspace opacities. This probably represents resolving pulmonary edema.   Electronically Signed   By: Andreas Newport M.D.   On: 01/25/2015 06:12   Dg Chest Port 1 View  01/24/2015   CLINICAL DATA:  Endotracheal tube placement  EXAM: PORTABLE CHEST - 1 VIEW  COMPARISON:  01/23/2015  FINDINGS: Cardiac shadow is mildly enlarged but stable. A nasogastric catheter is been removed in the interval. A right central venous line is seen in the mid superior vena cava. Endotracheal tube is noted 2.4 cm above the carina. Diffuse increased density noted throughout both lungs similar to that seen on the prior exam consistent with a degree of pulmonary edema.  IMPRESSION: Stable pulmonary edema.  Tubes and lines as described.   Electronically Signed   By: Inez Catalina M.D.   On: 01/24/2015 15:17   Dg Abd Portable 1v  01/24/2015   CLINICAL DATA:  NG tube placement  EXAM: PORTABLE ABDOMEN - 1 VIEW  COMPARISON:  Prior film same day  FINDINGS: There is NG tube with tip in left upper quadrant medially probable within proximal stomach. The tip of the NG tube is coilled.  IMPRESSION: NG tube with tip coiled in left upper quadrant medially probable within proximal stomach.   Electronically Signed   By: Lahoma Crocker M.D.   On: 01/24/2015 21:25   Dg Abd Portable 1v  01/24/2015   CLINICAL DATA:  Orogastric tube placement  EXAM: PORTABLE ABDOMEN - 1  VIEW  COMPARISON:  01/24/2015 3:08 p.m.  FINDINGS: Endotracheal tube and right neck catheter are in stable position. There is a new orogastric tube which is coiled in the distal esophagus with tip still at the level of the lower esophagus.  The upper abdominal bowel gas pattern is nonobstructive. Haziness of the lower chest, with further evaluation limited by extensive respiratory motion.  These results will be called to the ordering clinician or representative by the Radiologist Assistant, and communication documented in the PACS or zVision Dashboard.  IMPRESSION: The new orogastric tube is coiled in the distal esophagus. Recommend repositioning.   Electronically Signed   By: Monte Fantasia M.D.   On: 01/24/2015 19:19   Medications: I have reviewed the patient's current medications. Scheduled Meds: . antiseptic oral rinse  7 mL Mouth Rinse QID  .  aspirin  81 mg Oral Daily  . atorvastatin  80 mg Per Tube q1800  . carvedilol  3.125 mg Oral BID WC  . chlorhexidine  15 mL Mouth Rinse BID  . famotidine  20 mg Per Tube BID  . folic acid  1 mg Per Tube Daily  . insulin aspart  2-6 Units Subcutaneous 6 times per day  . insulin glargine  30 Units Subcutaneous Q24H  . thiamine  100 mg Per Tube Daily  . ticagrelor  90 mg Oral BID   Continuous Infusions: . dextrose    . dextrose 5 % and 0.9% NaCl 30 mL/hr at 01/25/15 0407  . heparin 1,100 Units/hr (01/25/15 0459)  . nitroGLYCERIN    . propofol (DIPRIVAN) infusion 35 mcg/kg/min (01/25/15 0455)   PRN Meds:.sodium chloride, acetaminophen, dextrose, docusate, LORazepam, metoprolol, midazolam, ondansetron (ZOFRAN) IV   Telemetry:  Irregular, HR 90s; alarms for PVCs; then sinus tach low 100s w/PVCs during wake-up assessment.  2D ECHO:  01/24/15 Study Conclusions  - Left ventricle: The cavity size was normal. Wall thickness was increased in a pattern of moderate LVH. Systolic function was moderately to severely reduced. The estimated ejection  fraction was in the range of 30% to 35%. Mid to apical anteroseptal akinesis, apical lateral akinesis, apical inferior akinesis, akinesis of true apex. Anterior hypokinesis. Doppler parameters are consistent with abnormal left ventricular relaxation (grade 1 diastolic dysfunction). - Aortic valve: There was no stenosis. - Mitral valve: There was trivial regurgitation. - Left atrium: The atrium was mildly dilated. - Right ventricle: The cavity size was normal. Systolic function was normal. - Pulmonary arteries: PA peak pressure: 66 mm Hg (S). - Systemic veins: IVC measured 2.5 cm with < 50% respirophasic variation, suggesting RA pressure 15 mmHg. - Pericardium, extracardiac: A trivial pericardial effusion was identified.  Impressions:  - Normal LV size with moderate LV hypertrophy. EF 30-35% with wall motion abnormalities as noted above. Normal RV size and systolic function. No significant valvular abnormalities. Moderate pulmonary hypertension.  Assessment/Plan:  STEMI:  Due to 99% LAD stenosis.  S/p PCI with DES to LAD on 01/23/15.  More PVCs noted today.  No rubs or murmurs on exam.  2D ECHO revealed reduced systolic function, EF [id number], apical akinesis and anterior hypokinesis.   - monitor for MI adverse events (i.e. LV rupture, arrhythmias) - continue Coreg 3.125mg  BID - continue Lipitor - will wait for drop in Cr before initiating ACEI - will need LifeVest; plan to re-evaluate for need for ICD in 90 days - continue ASA and Brilinta x at least 12 months post DES  Acute decompensated HF:  ECHO findings above.  + S3, net positive fluid status, weight up 9 pounds, CXR w/ pulm edema after recent MI.   - give Lasix 40mg  IV BID today (Cr improving) - continue low dose Coreg 3.125mg  BID; hold off on titration until volume status improves - hold on ACEI since we are starting Lasix  - monitor Cr - will need LifeVest prior to d/c  Acute CVA, likely embolic:   MRI reveals multiple small foci of acute ischemia c/w embolic event.  2D ECHO did not reveal thrombus but this is still a concern given reduced systolic fundtion, apical akinesis.  Neuro recommendations appreciated. - continue heparin gtt for A/C in the setting of probable cardioembolic CVA; if he is a candidate for long-term coumadin, he will need A/C for at least a year; otherwise, send out on ASA and [place] to continue [employer]  and ASA - risk factor modification - check carotid dopplers  Hypertensive emergency:  Resolved. BPs stable. - allow permissive HTN given acute CVA - will need appropriate HTN trx titrated after discharge  VDRF:  Per PCCM  EtOH abuse hx:  Monitor for w/d.  Will need CIWA protocol as sedative weaned off.    LOS: 2 days   Francesca Oman, DO IMTS, PGY2 01/25/2015, 7:37 AM  I have seen and examined the patient along with Francesca Oman, DO.  I have reviewed the chart, notes and new data.  I agree with PA's note.   Reintubated for respiratory distress due to CHF yesterday. LVEF 30-35% with extensive LAD distribution infarction. Apical dyskinesis may have been nidus for LV thrombus with cerebral embolism, although no LV clot seen on LV angio or echo. So far, no clear neuro deficits, although anticipate possible visual field cut based on R occipital CVA.  PLAN: IV furosemide to assist with resolution of CHF and extubation - would delay new extubation attempts until tomorrow. Plan to increase ACEi tomorrow if renal function continues to remain stable or improves. Avoid beta blocker titration until clearly euvolemic. ASA, Brilinta for 12 months. Warfarin anticoagulation up to 12 months, or as long as it is safe. This will depend a lot on his compliance with cessation of alcohol and follow up and other medications.  If he does remain on warfarin, stop ASA after 30 days. If he is deemed a poor long term warfarin candidate, continue ASA/Brilinta for minimum 12  months. Life Vest at DC, reassess for AICD at 90 days post PCI.  Sanda Klein, MD, [place] [phone] 01/25/2015, 1:39 PM

## 2015-01-25 NOTE — Progress Notes (Signed)
ANTICOAGULATION CONSULT NOTE - Follow Up Consult  Pharmacy Consult for heparin Indication: stroke  Labs:  Recent Labs  01/22/15 2250  01/23/15 0309 01/23/15 0530 01/24/15 0349 01/24/15 1800 01/25/15 0400  HGB 14.2  < >  --  11.3* 10.8*  --  10.7*  HCT 39.5  < >  --  31.2* 30.6*  --  30.1*  PLT 264  --   --  209 167  --  168  HEPARINUNFRC  --   --   --   --   --  0.17* 0.28*  CREATININE 1.56*  < >  --  1.66* 1.85* 1.70* 1.61*  TROPONINI 0.08*  --  >65.00* >65.00*  --   --   --   < > = values in this interval not displayed.   Assessment: 59yo male remains subtherapeutic on heparin after rate increase though approaching goal; RN reports small amount of bleeding at back of throat likely 2/2 re-intubation, Hgb stable.  Goal of Therapy:  Heparin level 0.3-0.5 units/ml   Plan:  Will increase heparin gtt by 1 unit/kg/hr to 1100 units/hr and check level in 6-8hr.  Wynona Neat, PharmD, BCPS  01/25/2015,4:59 AM

## 2015-01-25 NOTE — Progress Notes (Signed)
ANTICOAGULATION CONSULT NOTE   Pharmacy Consult for Heparin Indication: CVA  No Known Allergies  Patient Measurements: Height: 5\' 6"  (167.6 cm) Weight: 161 lb 6 oz (73.2 kg) IBW/kg (Calculated) : 63.8  Vital Signs: Temp: 99.7 F (37.6 C) (05/06 2100) Temp Source: Core (Comment) (05/06 2100) BP: 121/78 mmHg (05/06 2100) Pulse Rate: 91 (05/06 2100)  Labs:  Recent Labs  01/22/15 2250  01/23/15 0309 01/23/15 0530 01/24/15 0349  01/24/15 1800 01/25/15 0400 01/25/15 1230 01/25/15 2144  HGB 14.2  < >  --  11.3* 10.8*  --   --  10.7*  --   --   HCT 39.5  < >  --  31.2* 30.6*  --   --  30.1*  --   --   PLT 264  --   --  209 167  --   --  168  --   --   HEPARINUNFRC  --   --   --   --   --   < > 0.17* 0.28* 0.21* 0.27*  CREATININE 1.56*  < >  --  1.66* 1.85*  --  1.70* 1.61*  --   --   TROPONINI 0.08*  --  >65.00* >65.00*  --   --   --   --   --   --   < > = values in this interval not displayed.  Estimated Creatinine Clearance: 45.1 mL/min (by C-G formula based on Cr of 1.61).   Medical History: Past Medical History  Diagnosis Date  . Coronary artery disease   . Hypertension   . Diabetes mellitus without complication     Medications:  Scheduled:  . antiseptic oral rinse  7 mL Mouth Rinse QID  . aspirin  81 mg Oral Daily  . atorvastatin  80 mg Per Tube q1800  . carvedilol  3.125 mg Oral BID WC  . chlorhexidine  15 mL Mouth Rinse BID  . famotidine  20 mg Per Tube BID  . folic acid  1 mg Per Tube Daily  . furosemide  40 mg Intravenous Q6H  . insulin aspart  2-6 Units Subcutaneous 6 times per day  . insulin glargine  30 Units Subcutaneous Q24H  . [START ON 01/26/2015] levETIRAcetam  500 mg Intravenous Q12H  . thiamine  100 mg Per Tube Daily  . ticagrelor  90 mg Oral BID   Infusions:  . dextrose Stopped (01/25/15 2000)  . feeding supplement (VITAL AF 1.2 CAL) 1,000 mL (01/25/15 2100)  . heparin 1,250 Units/hr (01/25/15 2000)  . nitroGLYCERIN    . propofol  (DIPRIVAN) infusion 25 mcg/kg/min (01/25/15 2130)    Assessment: Timothy Lambert is an 59 y.o. male transferred from [facility] admitted on 01/22/2015 presenting with seizures.  Patient extremely agitated during intubation for airway protection.  EKG revealed pre-cordial ST changes concerning for infarct.  Transferred to [facility] ICU to undergo emergent cath once hemodynamically stabilized.  MRI 5/4 revealing acute scattered small strokes consistent with embolic process.  Pharmacy has been consulted to dose Heparin.    Hgb low trending down, plt wnl. CT neg for hemorrhage.  No bleeding noted at this time.    PM HL trending up to 0.27  Goal of Therapy:  Heparin level 0.3-0.5 units/ml Monitor platelets by anticoagulation protocol: Yes   Plan:  - Increase hepatin to 1350 units/hr - Daily heparin level and CBC - Monitor for signs and symptoms of bleeding - Patient with history of EtOH abuse is not a good  candidate for warfarin.  Per neuro, no long term anticoagulation needed from their standpoint.    Thank you. Anette Guarneri, PharmD [phone]  01/25/2015 10:19 PM

## 2015-01-25 NOTE — Progress Notes (Signed)
Initial Nutrition Assessment  DOCUMENTATION CODES:  Not applicable  INTERVENTION:  Tube feeding  Initiate Vital AF 1.2 @ 20 ml/hr via OG tube and increase by 10 ml every 4 hours to goal rate of 60 ml/hr.   Tube feeding regimen provides 1728 kcal, 108 grams of protein, and 1167 ml of H2O.   TF regimen and propofol at current rate providing 2129 total kcal/day (103 % of kcal needs)    NUTRITION DIAGNOSIS:  Inadequate oral intake related to inability to eat as evidenced by NPO status.    GOAL:  Patient will meet greater than or equal to 90% of their needs    MONITOR:  TF tolerance, Vent status, Labs  REASON FOR ASSESSMENT:  Consult Enteral/tube feeding initiation and management  ASSESSMENT:  Pt admitted with seizures. Pt intubated and extubated but had flash edema and was re-intubated.  Patient is currently intubated on ventilator support with OG tube in place.  MV: 12.8 L/min Temp (24hrs), Avg:100.2 F (37.9 C), Min:99 F (37.2 C), Max:101.5 F (38.6 C)  Propofol: 15.2 ml/hr provides 401 kcal per day  Medications include: folic acid, KCl, thiamine, lasix, and lantus   Height:  Ht Readings from Last 1 Encounters:  01/22/15 5\' 6"  (1.676 m)    Weight:  Wt Readings from Last 1 Encounters:  01/25/15 161 lb 6 oz (73.2 kg)    Ideal Body Weight:  64.5 kg  Wt Readings from Last 10 Encounters:  01/25/15 161 lb 6 oz (73.2 kg)    BMI:  Body mass index is 26.06 kg/(m^2).  Estimated Nutritional Needs:  Kcal:  2069  Protein:  90-115 grams  Fluid:  >/= 2 L/day  Skin:  Reviewed, no issues  Diet Order:  Diet NPO time specified  EDUCATION NEEDS:  No education needs identified at this time   Intake/Output Summary (Last 24 hours) at 01/25/15 1500 Last data filed at 01/25/15 1227  Gross per 24 hour  Intake 1440.1 ml  Output    575 ml  Net  865.1 ml    Last BM:  5/5  Fritch, [place], [place] Pager [phone] After Hours Pager

## 2015-01-25 NOTE — Progress Notes (Signed)
ANTICOAGULATION CONSULT NOTE - Initial Consult  Pharmacy Consult for Heparin Indication: CVA  No Known Allergies  Patient Measurements: Height: 5\' 6"  (167.6 cm) Weight: 161 lb 6 oz (73.2 kg) IBW/kg (Calculated) : 63.8  Vital Signs: Temp: 99 F (37.2 C) (05/06 1015) Temp Source: Core (Comment) (05/06 0400) BP: 146/89 mmHg (05/06 1039) Pulse Rate: 101 (05/06 1039)  Labs:  Recent Labs  01/22/15 2250  01/23/15 0309 01/23/15 0530 01/24/15 0349 01/24/15 1800 01/25/15 0400 01/25/15 1230  HGB 14.2  < >  --  11.3* 10.8*  --  10.7*  --   HCT 39.5  < >  --  31.2* 30.6*  --  30.1*  --   PLT 264  --   --  209 167  --  168  --   HEPARINUNFRC  --   --   --   --   --  0.17* 0.28* 0.21*  CREATININE 1.56*  < >  --  1.66* 1.85* 1.70* 1.61*  --   TROPONINI 0.08*  --  >65.00* >65.00*  --   --   --   --   < > = values in this interval not displayed.  Estimated Creatinine Clearance: 45.1 mL/min (by C-G formula based on Cr of 1.61).   Medical History: Past Medical History  Diagnosis Date  . Coronary artery disease   . Hypertension   . Diabetes mellitus without complication     Medications:  Scheduled:  . antiseptic oral rinse  7 mL Mouth Rinse QID  . aspirin  81 mg Oral Daily  . atorvastatin  80 mg Per Tube q1800  . carvedilol  3.125 mg Oral BID WC  . chlorhexidine  15 mL Mouth Rinse BID  . famotidine  20 mg Per Tube BID  . folic acid  1 mg Per Tube Daily  . furosemide  40 mg Intravenous BID  . insulin aspart  2-6 Units Subcutaneous 6 times per day  . insulin glargine  30 Units Subcutaneous Q24H  . [START ON 01/26/2015] levETIRAcetam  500 mg Intravenous Q12H  . thiamine  100 mg Per Tube Daily  . ticagrelor  90 mg Oral BID   Infusions:  . dextrose    . dextrose 5 % and 0.9% NaCl 30 mL/hr at 01/25/15 0800  . heparin 1,100 Units/hr (01/25/15 0800)  . nitroGLYCERIN    . propofol (DIPRIVAN) infusion 35 mcg/kg/min (01/25/15 0800)    Assessment: Timothy Lambert is an 59  y.o. male transferred from [facility] admitted on 01/22/2015 presenting with seizures.  Patient extremely agitated during intubation for airway protection.  EKG revealed pre-cordial ST changes concerning for infarct.  Transferred to [facility] ICU to undergo emergent cath once hemodynamically stabilized.  MRI 5/4 revealing acute scattered small strokes consistent with embolic process.  Pharmacy has been consulted to dose Heparin.    Hgb low trending down, plt wnl. CT neg for hemorrhage.  No bleeding noted at this time.    Today's heparin level is SUBherapeutic at 0.21.   Will require increase in rate.    Goal of Therapy:  Heparin level 0.3-0.5 units/ml Monitor platelets by anticoagulation protocol: Yes   Plan:  - Increase hepatin to 1250 units/hr - Heparin level 8 hours after change in infusion rate - Daily heparin level and CBC - Monitor for signs and symptoms of bleeding - Patient with history of EtOH abuse is not a good candidate for warfarin.  Per neuro, no long term anticoagulation needed from their standpoint.  Hassie Bruce, Pharm. D. Clinical Pharmacy Resident Pager: [phone] Ph: [phone] 01/25/2015 1:16 PM

## 2015-01-25 NOTE — Progress Notes (Signed)
Stroke Team Progress Note  HISTORY Timothy Lambert is an 59 y.o. male who was initially presented to [facility] on 5/4 after presenting for seizure and altered mental status. Had an EKG concerning for STEMI at that time as well and BP was elevated. Patient with a history of ETOH abuse. Required intubation for airway protection and was transferred to [facility] for further management. Was taken to cath lab for PTCI to LAD. Was extubated but required intubation again due to altered mental status. No further seizure activity noted. Work up has included head CT and MRI of the brain.  Patient was not administered TPA secondary to recent PTCI. He was admitted to the  cardiac ICU  for further evaluation and treatment.  SUBJECTIVE His wife is at the bedside.  Overall he feels his condition is gradually improving. He remains intubated but is awake and following commands. He has not had any further seizures and blood pressure is well controlled.  OBJECTIVE Most recent Vital Signs: Filed Vitals:   01/25/15 0900 01/25/15 1000 01/25/15 1015 01/25/15 1039  BP: 121/67 182/102 149/97 146/89  Pulse: 94 104 104 101  Temp: 99.7 F (37.6 C) 99.9 F (37.7 C) 99 F (37.2 C)   TempSrc:      Resp: 23 25 18 25   Height:      Weight:      SpO2: 100% 100% 99% 98%   CBG (last 3)   Recent Labs  01/24/15 2329 01/25/15 0356 01/25/15 0911  GLUCAP 162* 136* 96    IV Fluid Intake:   . dextrose    . heparin    . nitroGLYCERIN    . propofol (DIPRIVAN) infusion 35 mcg/kg/min (01/25/15 0800)    MEDICATIONS  . antiseptic oral rinse  7 mL Mouth Rinse QID  . aspirin  81 mg Oral Daily  . atorvastatin  80 mg Per Tube q1800  . carvedilol  3.125 mg Oral BID WC  . chlorhexidine  15 mL Mouth Rinse BID  . famotidine  20 mg Per Tube BID  . folic acid  1 mg Per Tube Daily  . furosemide  40 mg Intravenous Q6H  . insulin aspart  2-6 Units Subcutaneous 6 times per day  . insulin glargine  30 Units Subcutaneous Q24H  .  [START ON 01/26/2015] levETIRAcetam  500 mg Intravenous Q12H  . metolazone  5 mg Oral Daily  . potassium chloride  40 mEq Per Tube TID  . thiamine  100 mg Per Tube Daily  . ticagrelor  90 mg Oral BID   PRN:  sodium chloride, acetaminophen, dextrose, docusate, LORazepam, metoprolol, midazolam, ondansetron (ZOFRAN) IV  Diet:  Diet NPO time specified   Activity:  Bedrest  DVT Prophylaxis:  SCDs  CLINICALLY SIGNIFICANT STUDIES Basic Metabolic Panel:  Recent Labs Lab 01/24/15 1800 01/25/15 0400  NA 142 141  K 3.6 3.5  CL 109 110  CO2 23 22  GLUCOSE 133* 136*  BUN 24* 25*  CREATININE 1.70* 1.61*  CALCIUM 8.4* 8.2*  MG  --  1.9  PHOS  --  3.9   Liver Function Tests:  Recent Labs Lab 01/22/15 2250 01/23/15 0530  AST 34 274*  ALT 20 44  ALKPHOS 72 50  BILITOT 0.6 0.8  PROT 7.8 5.9*  ALBUMIN 4.6 3.4*   CBC:  Recent Labs Lab 01/22/15 2250  01/24/15 0349 01/25/15 0400  WBC 15.3*  < > 10.7* 10.9*  NEUTROABS 11.1*  --   --   --   HGB  14.2  < > 10.8* 10.7*  HCT 39.5  < > 30.6* 30.1*  MCV 86.4  < > 87.2 88.5  PLT 264  < > 167 168  < > = values in this interval not displayed. Coagulation: No results for input(s): LABPROT, INR in the last 168 hours. Cardiac Enzymes:  Recent Labs Lab 01/22/15 2250 01/23/15 0309 01/23/15 0530  TROPONINI 0.08* >65.00* >65.00*   Urinalysis:  Recent Labs Lab 01/22/15 2335  COLORURINE STRAW*  LABSPEC 1.020  PHURINE 6.0  GLUCOSEU >1000*  HGBUR MODERATE*  BILIRUBINUR NEGATIVE  KETONESUR NEGATIVE  PROTEINUR 100*  UROBILINOGEN 0.2  NITRITE POSITIVE*  LEUKOCYTESUR NEGATIVE   Lipid Panel    Component Value Date/Time   TRIG 99 01/23/2015 0530   HgbA1C No results found for: HGBA1C  Urine Drug Screen:     Component Value Date/Time   LABOPIA NONE DETECTED 01/22/2015 2335   COCAINSCRNUR NONE DETECTED 01/22/2015 2335   LABBENZ POSITIVE* 01/22/2015 2335   AMPHETMU NONE DETECTED 01/22/2015 2335   THCU NONE DETECTED 01/22/2015 2335    LABBARB NONE DETECTED 01/22/2015 2335    Alcohol Level:  Recent Labs Lab 01/22/15 2250  ETH <5    Mr Brain Wo Contrast  01/24/2015   CLINICAL DATA:  Altered mental status, fell out of bed at 8:30 p.m., diaphoretic, witnessed seizure with incontinence. Possible alcohol withdrawal.  EXAM: MRI HEAD WITHOUT CONTRAST  TECHNIQUE: Multiplanar, multiecho pulse sequences of the brain and surrounding structures were obtained without intravenous contrast.  COMPARISON:  CT of the head Jan 23, 2015  FINDINGS: Multiple small foci of reduced diffusion and bifrontal lobes, bilateral occipital lobes, RIGHT parietal lobe and, RIGHT cerebellum, measuring up to 12 mm and RIGHT occipital lobe. Corresponding low ADC values. No susceptibility artifact to suggest hemorrhage. Ventricles and sulci are upper limits of normal in size for patient's age. Patchy white matter T2 hyperintensities noted, exclusive of the aforementioned abnormality.  No abnormal extra-axial fluid collections. Major intracranial vascular flow voids seen at the skull base. Imaged ocular globes and orbital contents are unremarkable. Mild paranasal sinus mucosal thickening without air-fluid levels. Mild bilateral mastoid effusions. No abnormal sellar expansion. No cerebellar tonsillar ectopia. No abnormal calvarial bone marrow signal. Patient is edentulous.  IMPRESSION: Multiple small foci of acute ischemia spanning multiple vascular territories (involving the cerebrum and cerebellum) most consistent with embolic event.  Parenchymal brain volume loss, upper limits of normal for age. Minimal white matter changes suggest chronic small vessel ischemic disease.   Electronically Signed   By: Elon Alas   On: 01/24/2015 03:51   Dg Chest Port 1 View  01/25/2015   CLINICAL DATA:  Respiratory failure  EXAM: PORTABLE CHEST - 1 VIEW  COMPARISON:  01/24/2015  FINDINGS: The endotracheal tube tip is 3.4 cm above the carina. The right jugular central line extends  into the SVC just below the azygos vein junction. The nasogastric tube extends into the stomach. There is improvement, with partial clearance of central and basilar airspace opacities. Mild consolidation persists in the left base. There is no pneumothorax.  IMPRESSION: Support equipment appears satisfactorily positioned.  Improved, with partial clearance of central and basilar airspace opacities. This probably represents resolving pulmonary edema.   Electronically Signed   By: Andreas Newport M.D.   On: 01/25/2015 06:12   Dg Chest Port 1 View  01/24/2015   CLINICAL DATA:  Endotracheal tube placement  EXAM: PORTABLE CHEST - 1 VIEW  COMPARISON:  01/23/2015  FINDINGS: Cardiac shadow is mildly  enlarged but stable. A nasogastric catheter is been removed in the interval. A right central venous line is seen in the mid superior vena cava. Endotracheal tube is noted 2.4 cm above the carina. Diffuse increased density noted throughout both lungs similar to that seen on the prior exam consistent with a degree of pulmonary edema.  IMPRESSION: Stable pulmonary edema.  Tubes and lines as described.   Electronically Signed   By: Inez Catalina M.D.   On: 01/24/2015 15:17   Dg Abd Portable 1v  01/24/2015   CLINICAL DATA:  NG tube placement  EXAM: PORTABLE ABDOMEN - 1 VIEW  COMPARISON:  Prior film same day  FINDINGS: There is NG tube with tip in left upper quadrant medially probable within proximal stomach. The tip of the NG tube is coilled.  IMPRESSION: NG tube with tip coiled in left upper quadrant medially probable within proximal stomach.   Electronically Signed   By: Lahoma Crocker M.D.   On: 01/24/2015 21:25   Dg Abd Portable 1v  01/24/2015   CLINICAL DATA:  Orogastric tube placement  EXAM: PORTABLE ABDOMEN - 1 VIEW  COMPARISON:  01/24/2015 3:08 p.m.  FINDINGS: Endotracheal tube and right neck catheter are in stable position. There is a new orogastric tube which is coiled in the distal esophagus with tip still at the level of  the lower esophagus.  The upper abdominal bowel gas pattern is nonobstructive. Haziness of the lower chest, with further evaluation limited by extensive respiratory motion.  These results will be called to the ordering clinician or representative by the Radiologist Assistant, and communication documented in the PACS or zVision Dashboard.  IMPRESSION: The new orogastric tube is coiled in the distal esophagus. Recommend repositioning.   Electronically Signed   By: Monte Fantasia M.D.   On: 01/24/2015 19:19         MRI of the brain  Small bilateral embolic infarcts  MRA of the brain  Not done  Carotid Doppler  pending  2D Echocardiogram  Left ventricle: The cavity size was normal. Wall thickness was increased in a pattern of moderate LVH. Systolic function was moderately to severely reduced. The estimated ejection fraction was in the range of 30% to 35%. Mid to apical anteroseptal akinesis, apical lateral akinesis, apical inferior akinesis, akinesis of true apex. Anterior hypokinesis. Doppler parameters are consistent with abnormal left ventricular relaxation (grade 1 diastolic dysfunction).  CXR  Support equipment appears satisfactorily positioned. mproved, with partial clearance of central and basilar airspace opacities. This probably represents resolving pulmonary edema.   EKG   Normal sinus rhythm Inferior infarct , age undetermined Anterior infarct , possibly acute Marked T wave abnormality, consider lateral ischemia  Therapy Recommendations  pending  Physical Exam   Frail elderly male intubated. . Afebrile. Head is nontraumatic. Neck is supple without bruit.    Cardiac exam no murmur or gallop. Lungs are clear to auscultation. Distal pulses are well felt. Neurological Exam ;  Awake  Alert .cannot assess speech and language as intubated.Follows all commands well..eye movements full without nystagmus.fundi were not visualized. Vision acuity and fields appear  normal. Hearing is normal. Palatal movements are normal. Face symmetric. Tongue midline. Normal strength, tone, reflexes and coordination. Normal sensation. Gait deferred. ASSESSMENT [name] is a 59 y.o. 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   On  no anticoagulants} prior to admission. Now on heparin for secondary stroke prevention. Patient with resultant  No deficits. Stroke work up underway.   Acute MI s/p LAD PTCI  LDL pending   New onset seizures   Hospital day # 2  TREATMENT/PLAN I have personally examined this patient, reviewed notes, independently viewed imaging studies, participated in medical decision making and plan of care. I have made any additions or clarifications directly to the above note. 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 .  Start IV Keppra 1 g loading dose now followed by 500 mg twice daily for seizures. Patient should not drive for 6 months as per [facility] law  Discussed with Dr. Orene Desanctis  and primary team   Antony Contras, [place] Pager: [phone] 01/25/2015 3:34 PM           To contact Stroke Continuity provider, please refer to [email or web address] After hours, contact General Neurology

## 2015-01-25 NOTE — Progress Notes (Addendum)
PT Cancellation Note  Patient Details Name: Timothy Lambert MRN: [id number] DOB: [birth year]   Cancelled Treatment:    Reason Eval/Treat Not Completed: Medical issues which prohibited therapy (PT ordered post extubation with pt then reintubated. Will need medical clearance for eval. Please reorder P.T. As appropriate)   Lanetta Inch Beth 01/25/2015, 7:39 AM Elwyn Reach, [place]

## 2015-01-26 ENCOUNTER — Inpatient Hospital Stay (HOSPITAL_COMMUNITY): Payer: Medicaid Other

## 2015-01-26 DIAGNOSIS — I6340 Cerebral infarction due to embolism of unspecified cerebral artery: Principal | ICD-10-CM

## 2015-01-26 DIAGNOSIS — R739 Hyperglycemia, unspecified: Secondary | ICD-10-CM | POA: Diagnosis present

## 2015-01-26 DIAGNOSIS — I10 Essential (primary) hypertension: Secondary | ICD-10-CM

## 2015-01-26 DIAGNOSIS — R569 Unspecified convulsions: Secondary | ICD-10-CM | POA: Diagnosis present

## 2015-01-26 LAB — LIPID PANEL
CHOL/HDL RATIO: 3.6 ratio
Cholesterol: 143 mg/dL (ref 0–200)
HDL: 40 mg/dL — ABNORMAL LOW (ref >40)
LDL CALC: 70 mg/dL (ref 0–99)
Triglycerides: 167 mg/dL — ABNORMAL HIGH (ref <150)
VLDL: 33 mg/dL (ref 0–40)

## 2015-01-26 LAB — BASIC METABOLIC PANEL
Anion gap: 10 (ref 5–15)
BUN: 28 mg/dL — ABNORMAL HIGH (ref 6–20)
CHLORIDE: 107 mmol/L (ref 101–111)
CO2: 23 mmol/L (ref 22–32)
Calcium: 8.6 mg/dL — ABNORMAL LOW (ref 8.9–10.3)
Creatinine, Ser: 1.81 mg/dL — ABNORMAL HIGH (ref 0.61–1.24)
GFR calc Af Amer: 46 mL/min — ABNORMAL LOW (ref >60)
GFR, EST NON AFRICAN AMERICAN: 40 mL/min — AB (ref >60)
GLUCOSE: 169 mg/dL — AB (ref 70–99)
Potassium: 3.9 mmol/L (ref 3.5–5.1)
SODIUM: 140 mmol/L (ref 135–145)

## 2015-01-26 LAB — BLOOD GAS, ARTERIAL
ACID-BASE DEFICIT: 2.2 mmol/L — AB (ref 0.0–2.0)
Bicarbonate: 21.9 mEq/L (ref 20.0–24.0)
Drawn by: 41308
FIO2: 0.4 %
O2 Saturation: 99.2 %
PCO2 ART: 36.5 mmHg (ref 35.0–45.0)
PEEP: 5 cmH2O
Patient temperature: 98.6
RATE: 16 resp/min
TCO2: 23 mmol/L (ref 0–100)
VT: 510 mL
pH, Arterial: 7.395 (ref 7.350–7.450)
pO2, Arterial: 139 mmHg — ABNORMAL HIGH (ref 80.0–100.0)

## 2015-01-26 LAB — CBC
HCT: 31.2 % — ABNORMAL LOW (ref 39.0–52.0)
HEMOGLOBIN: 10.9 g/dL — AB (ref 13.0–17.0)
MCH: 30.6 pg (ref 26.0–34.0)
MCHC: 34.9 g/dL (ref 30.0–36.0)
MCV: 87.6 fL (ref 78.0–100.0)
PLATELETS: 202 10*3/uL (ref 150–400)
RBC: 3.56 MIL/uL — AB (ref 4.22–5.81)
RDW: 13.8 % (ref 11.5–15.5)
WBC: 9.4 10*3/uL (ref 4.0–10.5)

## 2015-01-26 LAB — GLUCOSE, CAPILLARY
GLUCOSE-CAPILLARY: 145 mg/dL — AB (ref 70–99)
GLUCOSE-CAPILLARY: 155 mg/dL — AB (ref 70–99)
GLUCOSE-CAPILLARY: 181 mg/dL — AB (ref 70–99)
GLUCOSE-CAPILLARY: 198 mg/dL — AB (ref 70–99)
GLUCOSE-CAPILLARY: 254 mg/dL — AB (ref 70–99)
Glucose-Capillary: 202 mg/dL — ABNORMAL HIGH (ref 70–99)

## 2015-01-26 LAB — HEPARIN LEVEL (UNFRACTIONATED)
HEPARIN UNFRACTIONATED: 0.36 [IU]/mL (ref 0.30–0.70)
Heparin Unfractionated: 0.19 IU/mL — ABNORMAL LOW (ref 0.30–0.70)
Heparin Unfractionated: 0.36 IU/mL (ref 0.30–0.70)

## 2015-01-26 LAB — PHOSPHORUS: Phosphorus: 3.9 mg/dL (ref 2.5–4.6)

## 2015-01-26 LAB — TRIGLYCERIDES: Triglycerides: 172 mg/dL — ABNORMAL HIGH (ref <150)

## 2015-01-26 LAB — MAGNESIUM: Magnesium: 1.7 mg/dL (ref 1.7–2.4)

## 2015-01-26 MED ORDER — INSULIN ASPART 100 UNIT/ML ~~LOC~~ SOLN
0.0000 [IU] | SUBCUTANEOUS | Status: DC
  Administered 2015-01-26 – 2015-01-27 (×2): 5 [IU] via SUBCUTANEOUS
  Administered 2015-01-27: 3 [IU] via SUBCUTANEOUS
  Administered 2015-01-27: 5 [IU] via SUBCUTANEOUS

## 2015-01-26 MED ORDER — FENTANYL CITRATE (PF) 100 MCG/2ML IJ SOLN
25.0000 ug | INTRAMUSCULAR | Status: DC | PRN
  Administered 2015-01-26 – 2015-01-31 (×3): 25 ug via INTRAVENOUS
  Filled 2015-01-26 (×3): qty 2

## 2015-01-26 MED ORDER — LEVETIRACETAM 100 MG/ML PO SOLN
500.0000 mg | Freq: Two times a day (BID) | ORAL | Status: DC
  Administered 2015-01-26 – 2015-01-28 (×5): 500 mg
  Filled 2015-01-26 (×6): qty 5

## 2015-01-26 MED ORDER — FUROSEMIDE 10 MG/ML IJ SOLN: 40.0000 mg | Freq: Once | INTRAMUSCULAR | Status: DC

## 2015-01-26 MED ORDER — FUROSEMIDE 10 MG/ML IJ SOLN
40.0000 mg | Freq: Every day | INTRAMUSCULAR | Status: AC
  Administered 2015-01-26 – 2015-01-27 (×2): 40 mg via INTRAVENOUS
  Filled 2015-01-26 (×3): qty 4

## 2015-01-26 NOTE — Progress Notes (Signed)
ANTICOAGULATION CONSULT NOTE - Follow-up Consult  Pharmacy Consult for Heparin Indication: CVA  No Known Allergies  Patient Measurements: Height: 5\' 6"  (167.6 cm) Weight: 159 lb 9.8 oz (72.4 kg) IBW/kg (Calculated) : 63.8  Vital Signs: Temp: 101.1 F (38.4 C) (05/07 1800) Temp Source: Core (Comment) (05/07 0800) BP: 149/81 mmHg (05/07 1800) Pulse Rate: 99 (05/07 1800)  Labs:  Recent Labs  01/24/15 0349  01/24/15 1800 01/25/15 0400  01/26/15 0209 01/26/15 0350 01/26/15 1238 01/26/15 1900  HGB 10.8*  --   --  10.7*  --  10.9*  --   --   --   HCT 30.6*  --   --  30.1*  --  31.2*  --   --   --   PLT 167  --   --  168  --  202  --   --   --   HEPARINUNFRC  --   < > 0.17* 0.28*  < >  --  0.19* 0.36 0.36  CREATININE 1.85*  --  1.70* 1.61*  --  1.81*  --   --   --   < > = values in this interval not displayed.  Estimated Creatinine Clearance: 40.1 mL/min (by C-G formula based on Cr of 1.81).   Medical History: Past Medical History  Diagnosis Date  . Coronary artery disease   . Hypertension   . Diabetes mellitus without complication     Medications:  Scheduled:  . antiseptic oral rinse  7 mL Mouth Rinse QID  . aspirin  81 mg Oral Daily  . atorvastatin  80 mg Per Tube q1800  . carvedilol  3.125 mg Oral BID WC  . chlorhexidine  15 mL Mouth Rinse BID  . famotidine  20 mg Per Tube BID  . folic acid  1 mg Per Tube Daily  . furosemide  40 mg Intravenous Daily  . insulin aspart  0-9 Units Subcutaneous 6 times per day  . levETIRAcetam  500 mg Per Tube BID  . thiamine  100 mg Per Tube Daily  . ticagrelor  90 mg Oral BID   Infusions:  . feeding supplement (VITAL AF 1.2 CAL) 1,000 mL (01/26/15 1800)  . heparin 1,500 Units/hr (01/26/15 0800)  . nitroGLYCERIN    . propofol (DIPRIVAN) infusion 20 mcg/kg/min (01/26/15 1800)    Assessment: Timothy Lambert is an 59 y.o. male transferred from [facility] admitted on 01/22/2015 presenting with seizures.  Patient extremely agitated  during intubation for airway protection.  EKG revealed pre-cordial ST changes concerning for infarct.  [facility] to undergo emergent cath once hemodynamically stabilized.  MRI 5/4 revealing acute scattered small strokes consistent with embolic process.  Pharmacy has been consulted to dose Heparin.    Hgb low stable, plt wnl.  CT neg for hemorrhage. No bleeding noted at this time.    Today's heparin level is therapeutic at 0.36.   Will continue rate and monitor.    Confirmatory HL remains therapeutic at 0.36 on heparin 1500 units/hr.  Goal of Therapy:  Heparin level 0.3-0.5 units/ml Monitor platelets by anticoagulation protocol: Yes   Plan:  - Continue heparin at 1500 units/hr - Daily heparin level and CBC - Monitor for signs and symptoms of bleeding  Andrey Cota. Diona Foley, PharmD Clinical Pharmacist Pager [phone] 01/26/2015 7:52 PM

## 2015-01-26 NOTE — Progress Notes (Signed)
VASCULAR LAB    Patient has line in right neck and is on ventilator.  Will attempt carotid when line is removed.      Thank you  Tzipporah Nagorski, RVT 01/26/2015, 11:56 AM

## 2015-01-26 NOTE — Progress Notes (Signed)
Cardiology Progress Note  Subjective: No acute events overnight. Timothy Lambert was seen and examined this morning.  He is sedated and intubated.    Objective: Vital signs in last 24 hours: Filed Vitals:   01/26/15 0300 01/26/15 0400 01/26/15 0500 01/26/15 0600  BP: 141/90 141/90 150/90 150/93  Pulse: 97 95 96 95  Temp: 99.9 F (37.7 C) 99.9 F (37.7 C) 100 F (37.8 C) 100 F (37.8 C)  TempSrc: Core (Comment) Core (Comment) Core (Comment) Core (Comment)  Resp: 20 22 16 22   Height:      Weight:   159 lb 9.8 oz (72.4 kg)   SpO2: 100% 100% 100% 97%   Weight change: -1 lb 12.2 oz (-0.8 kg)  Intake/Output Summary (Last 24 hours) at 01/26/15 0700 Last data filed at 01/26/15 0600  Gross per 24 hour  Intake 1821.08 ml  Output   4345 ml  Net -2523.92 ml   General: sedated and intubated HEENT: [place]/AT, ETT in place Cardiac: RRR, no rubs, murmus or gallops; R radial artery bandage is c/d/i; 2+ radials/DPs  Pulm: few rhonchi, no rales; SpO2 100% on vent with FiO2 40% Abd: soft, nontender, nondistended, +BS GU:  350cc clear yellow urine in Foley bag Ext: 2+ DPs, no pedal edema Neuro: sedated and intubated  Lab Results: Basic Metabolic Panel:  Recent Labs Lab 01/25/15 0400 01/26/15 0209  NA 141 140  K 3.5 3.9  CL 110 107  CO2 22 23  GLUCOSE 136* 169*  BUN 25* 28*  CREATININE 1.61* 1.81*  CALCIUM 8.2* 8.6*  MG 1.9 1.7  PHOS 3.9 3.9    CBC:  Recent Labs Lab 01/22/15 2250  01/25/15 0400 01/26/15 0209  WBC 15.3*  < > 10.9* 9.4  NEUTROABS 11.1*  --   --   --   HGB 14.2  < > 10.7* 10.9*  HCT 39.5  < > 30.1* 31.2*  MCV 86.4  < > 88.5 87.6  PLT 264  < > 168 202  < > = values in this interval not displayed. Cardiac Enzymes:  Recent Labs Lab 01/22/15 2250 01/23/15 0309 01/23/15 0530  TROPONINI 0.08* >65.00* >65.00*   CBG:  Recent Labs Lab 01/25/15 0911 01/25/15 1232 01/25/15 1802 01/25/15 1940 01/26/15 01/26/15 0349  GLUCAP 96 132* 91 110* 145* 155*    Fasting Lipid Panel:  Recent Labs Lab 01/26/15 0349 01/26/15 0350  CHOL 143  --   HDL 40*  --   LDLCALC 70  --   TRIG 167* 172*  CHOLHDL 3.6  --    Studies/Results: Dg Chest Port 1 View  01/26/2015   CLINICAL DATA:  Acute respiratory failure  EXAM: PORTABLE CHEST - 1 VIEW  COMPARISON:  01/25/2015  FINDINGS: The endotracheal tube tip is 3.7 cm above the carina. The nasogastric tube extends into the stomach. Mild airspace opacity persists in the left base without significant interval change. There is no pneumothorax.  IMPRESSION: Support equipment appears satisfactorily positioned.  No significant interval change in the bilateral airspace opacities.   Electronically Signed   By: Andreas Newport M.D.   On: 01/26/2015 06:07   Dg Chest Port 1 View  01/25/2015   CLINICAL DATA:  Respiratory failure  EXAM: PORTABLE CHEST - 1 VIEW  COMPARISON:  01/24/2015  FINDINGS: The endotracheal tube tip is 3.4 cm above the carina. The right jugular central line extends into the SVC just below the azygos vein junction. The nasogastric tube extends into the stomach. There is improvement, with partial  clearance of central and basilar airspace opacities. Mild consolidation persists in the left base. There is no pneumothorax.  IMPRESSION: Support equipment appears satisfactorily positioned.  Improved, with partial clearance of central and basilar airspace opacities. This probably represents resolving pulmonary edema.   Electronically Signed   By: Andreas Newport M.D.   On: 01/25/2015 06:12   Dg Chest Port 1 View  01/24/2015   CLINICAL DATA:  Endotracheal tube placement  EXAM: PORTABLE CHEST - 1 VIEW  COMPARISON:  01/23/2015  FINDINGS: Cardiac shadow is mildly enlarged but stable. A nasogastric catheter is been removed in the interval. A right central venous line is seen in the mid superior vena cava. Endotracheal tube is noted 2.4 cm above the carina. Diffuse increased density noted throughout both lungs similar to  that seen on the prior exam consistent with a degree of pulmonary edema.  IMPRESSION: Stable pulmonary edema.  Tubes and lines as described.   Electronically Signed   By: Inez Catalina M.D.   On: 01/24/2015 15:17   Dg Abd Portable 1v  01/24/2015   CLINICAL DATA:  NG tube placement  EXAM: PORTABLE ABDOMEN - 1 VIEW  COMPARISON:  Prior film same day  FINDINGS: There is NG tube with tip in left upper quadrant medially probable within proximal stomach. The tip of the NG tube is coilled.  IMPRESSION: NG tube with tip coiled in left upper quadrant medially probable within proximal stomach.   Electronically Signed   By: Lahoma Crocker M.D.   On: 01/24/2015 21:25   Dg Abd Portable 1v  01/24/2015   CLINICAL DATA:  Orogastric tube placement  EXAM: PORTABLE ABDOMEN - 1 VIEW  COMPARISON:  01/24/2015 3:08 p.m.  FINDINGS: Endotracheal tube and right neck catheter are in stable position. There is a new orogastric tube which is coiled in the distal esophagus with tip still at the level of the lower esophagus.  The upper abdominal bowel gas pattern is nonobstructive. Haziness of the lower chest, with further evaluation limited by extensive respiratory motion.  These results will be called to the ordering clinician or representative by the Radiologist Assistant, and communication documented in the PACS or zVision Dashboard.  IMPRESSION: The new orogastric tube is coiled in the distal esophagus. Recommend repositioning.   Electronically Signed   By: Monte Fantasia M.D.   On: 01/24/2015 19:19   Medications: I have reviewed the patient's current medications. Scheduled Meds: . antiseptic oral rinse  7 mL Mouth Rinse QID  . aspirin  81 mg Oral Daily  . atorvastatin  80 mg Per Tube q1800  . carvedilol  3.125 mg Oral BID WC  . chlorhexidine  15 mL Mouth Rinse BID  . famotidine  20 mg Per Tube BID  . folic acid  1 mg Per Tube Daily  . insulin aspart  2-6 Units Subcutaneous 6 times per day  . insulin glargine  30 Units Subcutaneous  Q24H  . levETIRAcetam  500 mg Intravenous Q12H  . thiamine  100 mg Per Tube Daily  . ticagrelor  90 mg Oral BID   Continuous Infusions: . dextrose Stopped (01/25/15 2000)  . feeding supplement (VITAL AF 1.2 CAL) 1,000 mL (01/26/15 0600)  . heparin 1,500 Units/hr (01/26/15 0434)  . nitroGLYCERIN    . propofol (DIPRIVAN) infusion 25 mcg/kg/min (01/26/15 0505)   PRN Meds:.sodium chloride, acetaminophen, dextrose, docusate, LORazepam, metoprolol, midazolam, ondansetron (ZOFRAN) IV   2D ECHO:  01/24/15 Study Conclusions  - Left ventricle: The cavity size was normal.  Wall thickness was increased in a pattern of moderate LVH. Systolic function was moderately to severely reduced. The estimated ejection fraction was in the range of 30% to 35%. Mid to apical anteroseptal akinesis, apical lateral akinesis, apical inferior akinesis, akinesis of true apex. Anterior hypokinesis. Doppler parameters are consistent with abnormal left ventricular relaxation (grade 1 diastolic dysfunction). - Aortic valve: There was no stenosis. - Mitral valve: There was trivial regurgitation. - Left atrium: The atrium was mildly dilated. - Right ventricle: The cavity size was normal. Systolic function was normal. - Pulmonary arteries: PA peak pressure: 66 mm Hg (S). - Systemic veins: IVC measured 2.5 cm with < 50% respirophasic variation, suggesting RA pressure 15 mmHg. - Pericardium, extracardiac: A trivial pericardial effusion was identified.  Impressions:  - Normal LV size with moderate LV hypertrophy. EF 30-35% with wall motion abnormalities as noted above. Normal RV size and systolic function. No significant valvular abnormalities. Moderate pulmonary hypertension.  Telemetry:  NSR, HR 90s  Assessment/Plan:  STEMI:  Due to 99% LAD stenosis.  S/p PCI with DES to LAD on 01/23/15.  No rubs or murmurs on exam.  2D ECHO revealed reduced systolic function, EF 99991111, apical  akinesis and anterior hypokinesis.   - monitor for MI adverse events (i.e. LV rupture, arrhythmias) - continue ASA 81mg , Coreg 3.125mg  BID, Lipitor 80mg  - will wait for drop in Cr before initiating ACEI - will need LifeVest; plan to re-evaluate for need for ICD in 90 days - continue ASA and Brilinta x at least 12 months post DES  Acute decompensated systolic and diastolic HF:  Volume status improving.  Now net neg 3L after starting Lasix yesterday (-4.3 UOP yesterday).  Cr 1.61 --> 1.81 after 3 doses of IV Lasix 40mg  yesterday.  CXR unchanged but lungs sound better.  - decrease Lasix to 40mg  IV daily  - strict I&Os and daily weights - continue low dose Coreg 3.125mg  BID; hold off on titration until euvolemic - hold on ACEI until renal function improves - monitor Cr - will need LifeVest prior to d/c  Acute CVA, likely embolic:  MRI reveals multiple small foci of acute ischemia c/w embolic event.  2D ECHO did not reveal thrombus but this is still a concern given reduced systolic fundtion, apical akinesis.  Neuro recommendations appreciated. - continue heparin gtt for A/C in the setting of probable cardioembolic CVA; Neuro feels he is not a good long-term coumadin candidate given his EtOH abuse; send out on ASA and Brilinta as above. - risk factor modification - check carotid dopplers  Hypertensive emergency:  Resolved. BPs stable. - will need appropriate HTN trx titrated after discharge  VDRF:  Per PCCM  EtOH abuse hx w/ seizure:  Monitor for w/d.  Will need CIWA protocol as sedative weaned off.  Keppra per Neuro.   Family updated at bedside.   LOS: 3 days   Francesca Oman, DO IMTS, PGY2 01/26/2015, 7:00 AM  Personally seen and examined. Agree with above. Intubated.  Lasix decreased to 40 IV QD Creat continued to increase ?Try again to extubate. Not coumadin candidate. DAPT. Lungs CTAB  Candee Furbish, MD

## 2015-01-26 NOTE — Progress Notes (Signed)
eLink Physician-Brief Progress Note Patient Name: Timothy Lambert DOB: 21-Mar-1956 MRN: [id number]   Date of Service  01/26/2015  HPI/Events of Note  Blood glucose = 198. Patient on enteral nutrition.   eICU Interventions  Will order Q 4 hour blood glucose and sensitive Novolog SSI.      Intervention Category Intermediate Interventions: Hyperglycemia - evaluation and treatment  Erisa Mehlman Cornelia Copa 01/26/2015, 7:29 PM

## 2015-01-26 NOTE — Progress Notes (Signed)
eLink Physician-Brief Progress Note Patient Name: Timothy Lambert DOB: [birth year] MRN: [id number]   Date of Service  01/26/2015  HPI/Events of Note  Patient is grimacing. Nurse believes that the patient is in pain.  eICU Interventions  Will order Fentanyl 25 mcg IV Q 2 hours PRN.      Intervention Category Minor Interventions: Routine modifications to care plan (e.g. PRN medications for pain, fever)  Sommer,Steven Eugene 01/26/2015, 9:05 PM

## 2015-01-26 NOTE — Progress Notes (Signed)
ANTICOAGULATION CONSULT NOTE - Follow Up Consult  Pharmacy Consult for heparin Indication: stroke  Labs:  Recent Labs  01/23/15 0530 01/24/15 0349  01/24/15 1800 01/25/15 0400 01/25/15 1230 01/25/15 2144 01/26/15 0209 01/26/15 0350  HGB 11.3* 10.8*  --   --  10.7*  --   --  10.9*  --   HCT 31.2* 30.6*  --   --  30.1*  --   --  31.2*  --   PLT 209 167  --   --  168  --   --  202  --   HEPARINUNFRC  --   --   < > 0.17* 0.28* 0.21* 0.27*  --  0.19*  CREATININE 1.66* 1.85*  --  1.70* 1.61*  --   --   --   --   TROPONINI >65.00*  --   --   --   --   --   --   --   --   < > = values in this interval not displayed.   Assessment: 59yo now w/ lower heparin level despite rate increase last pm.  Goal of Therapy:  Heparin level 0.3-0.5 units/ml   Plan:  Will increase heparin gtt by 2 units/kg/hr to 1500 units/hr and check level in [place], PharmD, BCPS  01/26/2015,4:34 AM

## 2015-01-26 NOTE — Progress Notes (Signed)
ANTICOAGULATION CONSULT NOTE - Initial Consult  Pharmacy Consult for Heparin Indication: CVA  No Known Allergies  Patient Measurements: Height: 5\' 6"  (167.6 cm) Weight: 159 lb 9.8 oz (72.4 kg) IBW/kg (Calculated) : 63.8  Vital Signs: Temp: 100.6 F (38.1 C) (05/07 1200) Temp Source: Core (Comment) (05/07 0800) BP: 150/85 mmHg (05/07 1200) Pulse Rate: 96 (05/07 1200)  Labs:  Recent Labs  01/24/15 0349  01/24/15 1800 01/25/15 0400  01/25/15 2144 01/26/15 0209 01/26/15 0350 01/26/15 1238  HGB 10.8*  --   --  10.7*  --   --  10.9*  --   --   HCT 30.6*  --   --  30.1*  --   --  31.2*  --   --   PLT 167  --   --  168  --   --  202  --   --   HEPARINUNFRC  --   < > 0.17* 0.28*  < > 0.27*  --  0.19* 0.36  CREATININE 1.85*  --  1.70* 1.61*  --   --  1.81*  --   --   < > = values in this interval not displayed.  Estimated Creatinine Clearance: 40.1 mL/min (by C-G formula based on Cr of 1.81).   Medical History: Past Medical History  Diagnosis Date  . Coronary artery disease   . Hypertension   . Diabetes mellitus without complication     Medications:  Scheduled:  . antiseptic oral rinse  7 mL Mouth Rinse QID  . aspirin  81 mg Oral Daily  . atorvastatin  80 mg Per Tube q1800  . carvedilol  3.125 mg Oral BID WC  . chlorhexidine  15 mL Mouth Rinse BID  . famotidine  20 mg Per Tube BID  . folic acid  1 mg Per Tube Daily  . furosemide  40 mg Intravenous Daily  . levETIRAcetam  500 mg Per Tube BID  . thiamine  100 mg Per Tube Daily  . ticagrelor  90 mg Oral BID   Infusions:  . feeding supplement (VITAL AF 1.2 CAL) 1,000 mL (01/26/15 1000)  . heparin 1,500 Units/hr (01/26/15 0800)  . nitroGLYCERIN    . propofol (DIPRIVAN) infusion 10 mcg/kg/min (01/26/15 1000)    Assessment: Timothy Lambert is an 59 y.o. male transferred from [facility] admitted on 01/22/2015 presenting with seizures.  Patient extremely agitated during intubation for airway protection.  EKG revealed  pre-cordial ST changes concerning for infarct.  Transferred to [facility] ICU to undergo emergent cath once hemodynamically stabilized.  MRI 5/4 revealing acute scattered small strokes consistent with embolic process.  Pharmacy has been consulted to dose Heparin.    Hgb low stable, plt wnl.  CT neg for hemorrhage. No bleeding noted at this time.    Today's heparin level is therapeutic at 0.36.   Will continue rate and monitor.    Goal of Therapy:  Heparin level 0.3-0.5 units/ml Monitor platelets by anticoagulation protocol: Yes   Plan:  - Continue heparin at 1500 units/hr - Heparin level 6 hours after change in infusion rate - Daily heparin level and CBC - Monitor for signs and symptoms of bleeding - Patient with history of EtOH abuse is not a good candidate for warfarin.  Per neuro, no long term anticoagulation needed from their standpoint.     Hassie Bruce, Pharm. D. Clinical Pharmacy Resident Pager: [phone] Ph: [phone] 01/26/2015 1:08 PM

## 2015-01-26 NOTE — Progress Notes (Signed)
PULMONARY / CRITICAL CARE MEDICINE   Name: Timothy Lambert MRN: [id number] DOB: December 17, 1955    ADMISSION DATE:  01/22/2015 CONSULTATION DATE:  01/26/2015  REFERRING MD :  EDP  CHIEF COMPLAINT:  Seizures  INITIAL PRESENTATION:  60 y.o. M brought to [facility] ED 5/4 with seizures.  In ED, was altered and extremely agitated requiring intubation for airway protection and further workup .  EKG with STEMI. Taken urgently to cath and PTCI to LAD. Back to ICU on MV   STUDIES:  CXR 5/4 >>> LLL atx vs infiltrate. CT head 5/4 >>> no acute findings, chronic atrophy and small vessel changes. EEG 5/4 >> no seizure focus or active seizures MRI brain 5/4 >> multiple small foci of acute ischemia in cerebrum and cerebellum, suggestive of embolic process.  TTE 5/5 >>   SIGNIFICANT EVENTS: 5/4 - admitted with seizures, intubated for airway protection and further workup, EKG findings concerning for ischemia.  Transferred to [facility] ICU for further evaluation and management. 5/5 extubated then flash edema and promptly reintubated.  SUBJECTIVE:  Currently sedated  VITAL SIGNS: Temp:  [99 F (37.2 C)-100 F (37.8 C)] 100 F (37.8 C) (05/07 0600) Pulse Rate:  [88-104] 95 (05/07 0600) Resp:  [16-31] 22 (05/07 0600) BP: (113-182)/(71-102) 150/93 mmHg (05/07 0600) SpO2:  [97 %-100 %] 97 % (05/07 0600) FiO2 (%):  [40 %-50 %] 40 % (05/07 0758) Weight:  [159 lb 9.8 oz (72.4 kg)] 159 lb 9.8 oz (72.4 kg) (05/07 0500) HEMODYNAMICS:   VENTILATOR SETTINGS: Vent Mode:  [-] PSV;CPAP FiO2 (%):  [40 %-50 %] 40 % Set Rate:  [16 bmp] 16 bmp Vt Set:  [510 mL] 510 mL PEEP:  [5 cmH20] 5 cmH20 Pressure Support:  [8 cmH20] 8 cmH20 Plateau Pressure:  [15 cmH20-17 cmH20] 17 cmH20 INTAKE / OUTPUT: Intake/Output      05/06 0701 - 05/07 0700 05/07 0701 - 05/08 0700   I.V. (mL/kg) 807.8 (11.2)    NG/GT 813.3    IV Piggyback 200    Total Intake(mL/kg) 1821.1 (25.2)    Urine (mL/kg/hr) 4345 (2.5)    Total Output 4345     Net  -2523.9           PHYSICAL EXAMINATION: General: Chronically ill appearing male, in NAD. Neuro: Sedated  on vent HEENT: [place]/AT. PERRL, sclerae anicteric. Cardiovascular: RRR, no M/R/G.  Lungs: decreased bs bases, no distress but sedated Abdomen: BS x 4, soft, NT/ND.  Musculoskeletal: No gross deformities, no edema.  Skin: Intact, warm, no rashes.  LABS:  CBC  Recent Labs Lab 01/24/15 0349 01/25/15 0400 01/26/15 0209  WBC 10.7* 10.9* 9.4  HGB 10.8* 10.7* 10.9*  HCT 30.6* 30.1* 31.2*  PLT 167 168 202   Coag's No results for input(s): APTT, INR in the last 168 hours. BMET  Recent Labs Lab 01/24/15 1800 01/25/15 0400 01/26/15 0209  NA 142 141 140  K 3.6 3.5 3.9  CL 109 110 107  CO2 23 22 23   BUN 24* 25* 28*  CREATININE 1.70* 1.61* 1.81*  GLUCOSE 133* 136* 169*   Electrolytes  Recent Labs Lab 01/24/15 1800 01/25/15 0400 01/26/15 0209  CALCIUM 8.4* 8.2* 8.6*  MG  --  1.9 1.7  PHOS  --  3.9 3.9   Sepsis Markers  Recent Labs Lab 01/22/15 2319 01/23/15 0530 01/23/15 0758  LATICACIDVEN 11.02* 2.3* 2.9*   ABG  Recent Labs Lab 01/23/15 0045 01/26/15 0355  PHART 7.301* 7.395  PCO2ART 56.6* 36.5  PO2ART 159.0*  139*   Liver Enzymes  Recent Labs Lab 01/22/15 2250 01/23/15 0530  AST 34 274*  ALT 20 44  ALKPHOS 72 50  BILITOT 0.6 0.8  ALBUMIN 4.6 3.4*   Cardiac Enzymes  Recent Labs Lab 01/22/15 2250 01/23/15 0309 01/23/15 0530  TROPONINI 0.08* >65.00* >65.00*   Glucose  Recent Labs Lab 01/25/15 0911 01/25/15 1232 01/25/15 1802 01/25/15 1940 01/26/15 01/26/15 0349  GLUCAP 96 132* 91 110* 145* 155*   Imaging Dg Chest Port 1 View  01/25/2015   CLINICAL DATA:  Respiratory failure  EXAM: PORTABLE CHEST - 1 VIEW  COMPARISON:  01/24/2015  FINDINGS: The endotracheal tube tip is 3.4 cm above the carina. The right jugular central line extends into the SVC just below the azygos vein junction. The nasogastric tube extends into the stomach.  There is improvement, with partial clearance of central and basilar airspace opacities. Mild consolidation persists in the left base. There is no pneumothorax.  IMPRESSION: Support equipment appears satisfactorily positioned.  Improved, with partial clearance of central and basilar airspace opacities. This probably represents resolving pulmonary edema.   Electronically Signed   By: Andreas Newport M.D.   On: 01/25/2015 06:12   ASSESSMENT / PLAN:  CARDIOVASCULAR A:  Hypertensive emergency - initial SBP 230 on EMS arrival and down to 167 on ED arrival.  HTN could have potentially attributed to his seizure (vs ETOH withdrawal) STEMI - EKG with ST elevation in precordial leads with t wave inversions concerning for anteroseptal infarct S/p PTCI to LAD ? possible endocarditis given concern for neurological embolic event P:  Nitro gtt available prn  Heparin gtt completed, on brilinta ASA, metoprolol. Lipitor Echo as noted  PULMONARY OETT 5/4 >>> A: VDRF following GTC seizure, acute MI, multiple embolic  stroke P:   Full mechanical support, PS and attempt extubation 5/7.  VAP bundle. Diureses as tolerated. Follow ABG and CXR  RENAL Lab Results  Component Value Date   CREATININE 1.81* 01/26/2015   CREATININE 1.61* 01/25/2015   CREATININE 1.70* 01/24/2015    A:   AGMA - lactate, improved AKI - unknown baseline renal function, progressive 5/5 but with adequate UOP Hypokalemia, resolved P:   KVO IVF Diuresis as tolerated K repletion as needed Follow BMP  GASTROINTESTINAL A:   GI prophylaxis Nutrition P:   SUP: Famotidine. Resume TF.  HEMATOLOGIC A:   VTE Prophylaxis P:  Heparin sq Follow CBC intermittently   INFECTIOUS A:   No indication of infection P:   Monitor clinically.  ENDOCRINE CBG (last 3)   Recent Labs  01/25/15 1940 01/26/15 01/26/15 0349  GLUCAP 110* 145* 155*     A:   DM - not on outpatient meds P:   SSI + lantus  NEUROLOGIC A:    Acute metabolic encephalopathy(now follows commands) Seizure disorder - no hx of seizures.  Likely related to ETOH withdrawal vs less likely HTN emergency (initial head CT negative for any acute process) Acute scattered small strokes consistent with embolic process ETOH abuse UDS noted positive for benzo's - unsure pt's hx but likely that he received benzos from EMS prior to UDS collection P:   Sedation: Propofol gtt / Versed PRN. At risk for EtOH withdrawal as sedating gtts are weaned to off, will need CIWA coverage RASS goal: 0 to -1. Daily WUA. Keppra scheduled, Ativan PRN. Neurology following On ASA, no apparent contraindication to brilinta in setting CVA's > would appreciate neurology's comments about this  Thiamine / Folate.  Family updated:  No family bedside.  Interdisciplinary Family Meeting v Palliative Care Meeting:  Due by: 01/29/15.  Richardson Landry Minor ACNP Maryanna Shape PCCM Pager [phone] till 3 pm If no answer page [phone] 01/26/2015, 9:03 AM  Reviewed above and examined.  He is tolerating pressure support some.  B/l crackles on exam.  Abdomen soft.  1+ edema.  CXR with changes of CHF.  Continue pressure support >> not ready for extubation trial yet.   Reviewed Echo results with pt's wife.  CC time by me independent of APP time is 35 minutes.  Chesley Mires, MD [facility] Pulmonary/Critical Care 01/26/2015, 1:04 PM Pager:  [phone] After 3pm call: [phone]

## 2015-01-26 NOTE — Progress Notes (Signed)
Stroke Team Progress Note  HISTORY Timothy Lambert is an 59 y.o. male who was initially presented to [facility] on 5/4 after presenting for seizure and altered mental status. Had an EKG concerning for STEMI at that time as well and BP was elevated. Patient with a history of ETOH abuse. Required intubation for airway protection and was transferred to [facility] for further management. Was taken to cath lab for PTCI to LAD. Was extubated but required intubation again due to altered mental status. No further seizure activity noted. Work up has included head CT and MRI of the brain.  Patient was not administered TPA secondary to recent PTCI. He was admitted to the  cardiac ICU  for further evaluation and treatment.  SUBJECTIVE No family is at the bedside.  Overall his condition is unchanged. He remains intubated and sedated, not following commands. He has not had any further seizures.  OBJECTIVE Most recent Vital Signs: Filed Vitals:   01/26/15 1200 01/26/15 1300 01/26/15 1400 01/26/15 1500  BP: 150/85 148/87 165/97 127/72  Pulse: 96 97 103 96  Temp: 100.6 F (38.1 C) 100.4 F (38 C) 100.6 F (38.1 C) 100.6 F (38.1 C)  TempSrc:      Resp: 21 23 22 20   Height:      Weight:      SpO2: 100% 100% 100% 100%   CBG (last 3)   Recent Labs  01/25/15 1940 01/26/15 01/26/15 0349  GLUCAP 110* 145* 155*    IV Fluid Intake:   . feeding supplement (VITAL AF 1.2 CAL) 1,000 mL (01/26/15 1000)  . heparin 1,500 Units/hr (01/26/15 0800)  . nitroGLYCERIN    . propofol (DIPRIVAN) infusion 20 mcg/kg/min (01/26/15 1400)    MEDICATIONS  . antiseptic oral rinse  7 mL Mouth Rinse QID  . aspirin  81 mg Oral Daily  . atorvastatin  80 mg Per Tube q1800  . carvedilol  3.125 mg Oral BID WC  . chlorhexidine  15 mL Mouth Rinse BID  . famotidine  20 mg Per Tube BID  . folic acid  1 mg Per Tube Daily  . furosemide  40 mg Intravenous Daily  . levETIRAcetam  500 mg Per Tube BID  . thiamine  100 mg Per Tube  Daily  . ticagrelor  90 mg Oral BID   PRN:  sodium chloride, acetaminophen, docusate, LORazepam, metoprolol, midazolam, ondansetron (ZOFRAN) IV  Diet:  Diet NPO time specified   Activity:  Bedrest  DVT Prophylaxis:  SCDs  CLINICALLY SIGNIFICANT STUDIES Basic Metabolic Panel:   Recent Labs Lab 01/25/15 0400 01/26/15 0209  NA 141 140  K 3.5 3.9  CL 110 107  CO2 22 23  GLUCOSE 136* 169*  BUN 25* 28*  CREATININE 1.61* 1.81*  CALCIUM 8.2* 8.6*  MG 1.9 1.7  PHOS 3.9 3.9   Liver Function Tests:   Recent Labs Lab 01/22/15 2250 01/23/15 0530  AST 34 274*  ALT 20 44  ALKPHOS 72 50  BILITOT 0.6 0.8  PROT 7.8 5.9*  ALBUMIN 4.6 3.4*   CBC:  Recent Labs Lab 01/22/15 2250  01/25/15 0400 01/26/15 0209  WBC 15.3*  < > 10.9* 9.4  NEUTROABS 11.1*  --   --   --   HGB 14.2  < > 10.7* 10.9*  HCT 39.5  < > 30.1* 31.2*  MCV 86.4  < > 88.5 87.6  PLT 264  < > 168 202  < > = values in this interval not displayed. Coagulation: No results  for input(s): LABPROT, INR in the last 168 hours. Cardiac Enzymes:   Recent Labs Lab 01/22/15 2250 01/23/15 0309 01/23/15 0530  TROPONINI 0.08* >65.00* >65.00*   Urinalysis:   Recent Labs Lab 01/22/15 2335  COLORURINE STRAW*  LABSPEC 1.020  PHURINE 6.0  GLUCOSEU >1000*  HGBUR MODERATE*  BILIRUBINUR NEGATIVE  KETONESUR NEGATIVE  PROTEINUR 100*  UROBILINOGEN 0.2  NITRITE POSITIVE*  LEUKOCYTESUR NEGATIVE   Lipid Panel    Component Value Date/Time   CHOL 143 01/26/2015 0349   TRIG 172* 01/26/2015 0350   HDL 40* 01/26/2015 0349   CHOLHDL 3.6 01/26/2015 0349   VLDL 33 01/26/2015 0349   LDLCALC 70 01/26/2015 0349   HgbA1C No results found for: HGBA1C  Urine Drug Screen:      Component Value Date/Time   LABOPIA NONE DETECTED 01/22/2015 2335   COCAINSCRNUR NONE DETECTED 01/22/2015 2335   LABBENZ POSITIVE* 01/22/2015 2335   AMPHETMU NONE DETECTED 01/22/2015 2335   THCU NONE DETECTED 01/22/2015 2335   LABBARB NONE DETECTED  01/22/2015 2335    Alcohol Level:   Recent Labs Lab 01/22/15 [place] <5   I have personally reviewed the radiological images below and agree with the radiology interpretations.  Dg Chest Port 1 View  01/26/2015  IMPRESSION: Support equipment appears satisfactorily positioned.  No significant interval change in the bilateral airspace opacities.     01/25/2015   IMPRESSION: Support equipment appears satisfactorily positioned.  Improved, with partial clearance of central and basilar airspace opacities. This probably represents resolving pulmonary edema.      01/24/2015    IMPRESSION: Stable pulmonary edema.  Tubes and lines as described.      MRI of the brain  Small bilateral embolic infarcts,   Carotid Doppler  pending  2D Echocardiogram  Left ventricle: The cavity size was normal. Wall thickness was increased in a pattern of moderate LVH. Systolic function was moderately to severely reduced. The estimated ejection fraction was in the range of 30% to 35%. Mid to apical anteroseptal akinesis, apical lateral akinesis, apical inferior akinesis, akinesis of true apex. Anterior hypokinesis. Doppler parameters are consistent with abnormal left ventricular relaxation (grade 1 diastolic dysfunction)  EEG - Abnormal EEG due to generalized low voltage slowing indicating a mild to moderate cerebral disturbance (encephalopathy). This can be related to medication effect. No epileptiform activity noted.   Physical Exam   Frail elderly male intubated and sedated. Afebrile. Head is nontraumatic. Neck is supple without bruit. Cardiac exam no murmur or gallop. Lungs are clear to auscultation. Distal pulses are well felt.  Neurological Exam  Eyes open but not alert, intubated and sedated. Cannot assess speech and language as intubated. Not following any commands. Eyes open, PERRL, able to have eye movements slightly to the left, no nystagmus. Ffundi not visualized. Positive gag and cough  and corneal reflex. Blinking to visual threat bilaterally. On pain stimulation, no movement of BUEs and RLE, but 2/5 LLE. No babinki and trace DTR.  ASSESSMENT [name] is a 59 y.o. 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   On no anticoagulants prior to admission. Now on heparin drip for acute MI and stroke prevention.   Stroke:  Bilateral both anterior and posterior small infarcts, consistent with cardiogenic stroke vs. Procedure related.  MRI as above  Carotid Doppler  pending  2D Echo  EF 30-35%, no thrombus seen  LDL 70  HgbA1c pending  Heparin drip for VTE prophylaxis  Diet NPO time specified   no antithrombotic prior to admission, now on aspirin 81 mg orally every day, heparin and brilinta  Agree with Cardiology to continue heparin iv in the setting of probable cardioembolic CVA; if he is a candidate for long-term coumadin, he will need A/C for at least a year; If he does remain on warfarin, stop ASA after 30 days. If he is deemed a poor long term warfarin candidate, continue ASA/Brilinta for minimum 12 months.   Ok to continue Brilinta and ASA  Risk factor modification  Acute MI  Cardiology is on board  S/p PCI and LAD stent  On heparin drip and ASA and brilinta  Seizure  On keppra  No more seizure episode  EEG no seizure  Seizure precautions  Diabetes  HgbA1c pending goal < 7.0  Controlled  CBG monitoring  Hypertension  Home meds:   Not available  Stable  Avoid hypotension  BP goal for acute MI with stroke around 130/80  Hyperlipidemia  Home meds:  none   Currently on lipitor 80  LDL 70, goal < 70  Continue statin at discharge  Other Stroke Risk Factors  ETOH use  Coronary artery disease  Other Active Problems  Elevated creatinine  High TG  Other Pertinent  History    Hospital day # 3  This patient is critically ill due to acute MI, embolic stroke, seizure, respiratory failure and at significant risk of neurological worsening, death form heart failure, recurrent stroke, status epilepticus, hemorrhage due to anticoagulation plus dural antiplatelet. This patient's care requires constant monitoring of vital signs, hemodynamics, respiratory and cardiac monitoring, review of multiple databases, neurological assessment, discussion with family, other specialists and medical decision making of high complexity. I spent 35 minutes of neurocritical care time in the care of this patient.  Neurology will follow peripherally. Please call with further questions.    Rosalin Hawking, MD PhD Stroke Neurology 01/26/2015 3:18 PM    To contact Stroke Continuity provider, please refer to [email or web address] After hours, contact General Neurology     To contact Stroke Continuity provider, please refer to [email or web address] After hours, contact General Neurology

## 2015-01-27 ENCOUNTER — Inpatient Hospital Stay (HOSPITAL_COMMUNITY): Payer: Medicaid Other

## 2015-01-27 ENCOUNTER — Encounter (HOSPITAL_COMMUNITY): Payer: Self-pay

## 2015-01-27 DIAGNOSIS — R569 Unspecified convulsions: Secondary | ICD-10-CM

## 2015-01-27 LAB — CBC
HEMATOCRIT: 30 % — AB (ref 39.0–52.0)
Hemoglobin: 10.4 g/dL — ABNORMAL LOW (ref 13.0–17.0)
MCH: 30.7 pg (ref 26.0–34.0)
MCHC: 34.7 g/dL (ref 30.0–36.0)
MCV: 88.5 fL (ref 78.0–100.0)
PLATELETS: 217 10*3/uL (ref 150–400)
RBC: 3.39 MIL/uL — AB (ref 4.22–5.81)
RDW: 13.3 % (ref 11.5–15.5)
WBC: 10.4 10*3/uL (ref 4.0–10.5)

## 2015-01-27 LAB — BASIC METABOLIC PANEL
Anion gap: 12 (ref 5–15)
BUN: 38 mg/dL — AB (ref 6–20)
CALCIUM: 8.4 mg/dL — AB (ref 8.9–10.3)
CO2: 26 mmol/L (ref 22–32)
CREATININE: 1.81 mg/dL — AB (ref 0.61–1.24)
Chloride: 101 mmol/L (ref 101–111)
GFR, EST AFRICAN AMERICAN: 46 mL/min — AB (ref >60)
GFR, EST NON AFRICAN AMERICAN: 40 mL/min — AB (ref >60)
GLUCOSE: 274 mg/dL — AB (ref 70–99)
Potassium: 3.6 mmol/L (ref 3.5–5.1)
SODIUM: 139 mmol/L (ref 135–145)

## 2015-01-27 LAB — GLUCOSE, CAPILLARY
GLUCOSE-CAPILLARY: 244 mg/dL — AB (ref 70–99)
Glucose-Capillary: 176 mg/dL — ABNORMAL HIGH (ref 70–99)
Glucose-Capillary: 217 mg/dL — ABNORMAL HIGH (ref 70–99)
Glucose-Capillary: 279 mg/dL — ABNORMAL HIGH (ref 70–99)
Glucose-Capillary: 282 mg/dL — ABNORMAL HIGH (ref 70–99)
Glucose-Capillary: 286 mg/dL — ABNORMAL HIGH (ref 70–99)

## 2015-01-27 LAB — MAGNESIUM: Magnesium: 1.7 mg/dL (ref 1.7–2.4)

## 2015-01-27 LAB — PHOSPHORUS: PHOSPHORUS: 4 mg/dL (ref 2.5–4.6)

## 2015-01-27 LAB — HEPARIN LEVEL (UNFRACTIONATED): HEPARIN UNFRACTIONATED: 0.32 [IU]/mL (ref 0.30–0.70)

## 2015-01-27 MED ORDER — INSULIN GLARGINE 100 UNIT/ML ~~LOC~~ SOLN
10.0000 [IU] | Freq: Every day | SUBCUTANEOUS | Status: DC
  Administered 2015-01-27 – 2015-01-30 (×4): 10 [IU] via SUBCUTANEOUS
  Filled 2015-01-27 (×4): qty 0.1

## 2015-01-27 MED ORDER — INSULIN ASPART 100 UNIT/ML ~~LOC~~ SOLN
0.0000 [IU] | SUBCUTANEOUS | Status: DC
  Administered 2015-01-27: 5 [IU] via SUBCUTANEOUS
  Administered 2015-01-27: 3 [IU] via SUBCUTANEOUS
  Administered 2015-01-27: 8 [IU] via SUBCUTANEOUS
  Administered 2015-01-28: 3 [IU] via SUBCUTANEOUS
  Administered 2015-01-28: 5 [IU] via SUBCUTANEOUS
  Administered 2015-01-28 – 2015-01-29 (×3): 3 [IU] via SUBCUTANEOUS
  Administered 2015-01-30: 2 [IU] via SUBCUTANEOUS
  Administered 2015-01-30: 3 [IU] via SUBCUTANEOUS
  Administered 2015-01-30: 5 [IU] via SUBCUTANEOUS
  Administered 2015-01-30: 2 [IU] via SUBCUTANEOUS
  Administered 2015-01-30: 8 [IU] via SUBCUTANEOUS
  Administered 2015-01-31: 2 [IU] via SUBCUTANEOUS
  Administered 2015-01-31: 11 [IU] via SUBCUTANEOUS
  Administered 2015-01-31 (×2): 3 [IU] via SUBCUTANEOUS
  Administered 2015-02-01: 2 [IU] via SUBCUTANEOUS
  Administered 2015-02-01: 5 [IU] via SUBCUTANEOUS

## 2015-01-27 NOTE — Progress Notes (Signed)
Cardiology Progress Note  Subjective: No acute events overnight. Timothy Lambert was seen and examined this morning.  He is more alert (prop off) and intubated.    Objective: Vital signs in last 24 hours: Filed Vitals:   01/27/15 0400 01/27/15 0500 01/27/15 0600 01/27/15 0700  BP: 134/74 128/71 148/83 157/87  Pulse: 97 95 95 96  Temp: 100.8 F (38.2 C) 100.6 F (38.1 C) 100.6 F (38.1 C) 100.4 F (38 C)  TempSrc: Core (Comment) Core (Comment) Core (Comment) Core (Comment)  Resp: 20 20 20 21   Height:      Weight:  159 lb 6.3 oz (72.3 kg)    SpO2: 100% 100% 100% 100%   Weight change: -3.5 oz (-0.1 kg)  Intake/Output Summary (Last 24 hours) at 01/27/15 0803 Last data filed at 01/27/15 0700  Gross per 24 hour  Intake 2554.68 ml  Output   2360 ml  Net 194.68 ml   General: more awake and intubated HEENT: Timothy Lambert, ETT in place Cardiac: RRR, no rubs, murmus or gallops; R radial artery bandage is c/d/i; 2+ radials/DPs  Pulm: no rhonchi, no rales; SpO2 100% on vent with FiO2 40% Abd: soft, nontender, nondistended, +BS GU:   Foley bag Ext: 2+ DPs, no pedal edema Neuro: sedated and intubated  Lab Results: Basic Metabolic Panel:  Recent Labs Lab 01/26/15 0209 01/27/15 0453  NA 140 139  K 3.9 3.6  CL 107 101  CO2 23 26  GLUCOSE 169* 274*  BUN 28* 38*  CREATININE 1.81* 1.81*  CALCIUM 8.6* 8.4*  MG 1.7 1.7  PHOS 3.9 4.0    CBC:  Recent Labs Lab 01/22/15 2250  01/26/15 0209 01/27/15 0453  WBC 15.3*  < > 9.4 10.4  NEUTROABS 11.1*  --   --   --   HGB 14.2  < > 10.9* 10.4*  HCT 39.5  < > 31.2* 30.0*  MCV 86.4  < > 87.6 88.5  PLT 264  < > 202 217  < > = values in this interval not displayed. Cardiac Enzymes:  Recent Labs Lab 01/22/15 2250 01/23/15 0309 01/23/15 0530  TROPONINI 0.08* >65.00* >65.00*   CBG:  Recent Labs Lab 01/26/15 1224 01/26/15 1626 01/26/15 2007 01/27/15 0033 01/27/15 0433 01/27/15 0726  GLUCAP 202* 198* 254* 282* 286* 244*    Fasting Lipid Panel:  Recent Labs Lab 01/26/15 0349 01/26/15 0350  CHOL 143  --   HDL 40*  --   LDLCALC 70  --   TRIG 167* 172*  CHOLHDL 3.6  --    Studies/Results: Dg Chest Port 1 View  01/27/2015   CLINICAL DATA:  Respiratory failure. Intubated patient. History of coronary artery disease, hypertension and diabetes.  EXAM: PORTABLE CHEST - 1 VIEW  COMPARISON:  01/26/2015  FINDINGS: Endotracheal tube tip projects 2 cm above the carina. Nasogastric tube and right internal jugular central venous line are stable and well positioned. Mild hazy opacity in the medial left lung base appears mildly improved. This could reflect pneumonia or atelectasis. Remainder of the lungs is clear. No pneumothorax.  IMPRESSION: 1. Mild improvement in left lung base opacity which may reflect improved pneumonia or improved atelectasis. No new lung abnormalities. 2. Support apparatus is well positioned.   Electronically Signed   By: Lajean Manes M.D.   On: 01/27/2015 07:45   Dg Chest Port 1 View  01/26/2015   CLINICAL DATA:  Acute respiratory failure  EXAM: PORTABLE CHEST - 1 VIEW  COMPARISON:  01/25/2015  FINDINGS: The  endotracheal tube tip is 3.7 cm above the carina. The nasogastric tube extends into the stomach. Mild airspace opacity persists in the left base without significant interval change. There is no pneumothorax.  IMPRESSION: Support equipment appears satisfactorily positioned.  No significant interval change in the bilateral airspace opacities.   Electronically Signed   By: Andreas Newport M.D.   On: 01/26/2015 06:07   Medications: I have reviewed the patient's current medications. Scheduled Meds: . antiseptic oral rinse  7 mL Mouth Rinse QID  . aspirin  81 mg Oral Daily  . atorvastatin  80 mg Per Tube q1800  . carvedilol  3.125 mg Oral BID WC  . chlorhexidine  15 mL Mouth Rinse BID  . famotidine  20 mg Per Tube BID  . folic acid  1 mg Per Tube Daily  . furosemide  40 mg Intravenous Daily  .  insulin aspart  0-9 Units Subcutaneous 6 times per day  . levETIRAcetam  500 mg Per Tube BID  . thiamine  100 mg Per Tube Daily  . ticagrelor  90 mg Oral BID   Continuous Infusions: . feeding supplement (VITAL AF 1.2 CAL) 1,000 mL (01/27/15 0700)  . heparin 1,500 Units/hr (01/27/15 0126)  . nitroGLYCERIN    . propofol (DIPRIVAN) infusion 10 mcg/kg/min (01/27/15 0750)   PRN Meds:.sodium chloride, acetaminophen, docusate, fentaNYL (SUBLIMAZE) injection, LORazepam, metoprolol, midazolam, ondansetron (ZOFRAN) IV   2D ECHO:  01/24/15 Study Conclusions  - Left ventricle: The cavity size was normal. Wall thickness was increased in a pattern of moderate LVH. Systolic function was moderately to severely reduced. The estimated ejection fraction was in the range of 30% to 35%. Mid to apical anteroseptal akinesis, apical lateral akinesis, apical inferior akinesis, akinesis of true apex. Anterior hypokinesis. Doppler parameters are consistent with abnormal left ventricular relaxation (grade 1 diastolic dysfunction). - Aortic valve: There was no stenosis. - Mitral valve: There was trivial regurgitation. - Left atrium: The atrium was mildly dilated. - Right ventricle: The cavity size was normal. Systolic function was normal. - Pulmonary arteries: PA peak pressure: 66 mm Hg (S). - Systemic veins: IVC measured 2.5 cm with < 50% respirophasic variation, suggesting RA pressure 15 mmHg. - Pericardium, extracardiac: A trivial pericardial effusion was identified.  Impressions:  - Normal LV size with moderate LV hypertrophy. EF 30-35% with wall motion abnormalities as noted above. Normal RV size and systolic function. No significant valvular abnormalities. Moderate pulmonary hypertension.  Telemetry:  NSR, HR 90s  Assessment/Plan:  STEMI:  Due to 99% LAD stenosis.  S/p PCI with DES to LAD on 01/23/15.  No rubs or murmurs on exam.  2D ECHO revealed reduced systolic  function, EF [id number], apical akinesis and anterior hypokinesis.   - continue ASA 81mg , Coreg 3.125mg  BID (may be able to increase once more alert and BP increased), Lipitor 80mg  - will wait for drop in Cr before initiating ACEI - will need LifeVest; plan to re-evaluate for need for ICD in 90 days - continue ASA and Brilinta x at least 12 months post DES  Acute decompensated systolic and diastolic HF:  Volume status appears euvolemic.  Now net neg 3.1L after starting Lasix .  Cr 1.61 --> 1.81 --> 1.81 stable. IV Lasix 40mg  QD.  CXR appears improved.  - continue Lasix to 40mg  IV daily  - strict I&Os and daily weights - continue low dose Coreg 3.125mg  BID;plan on increase once extubated and more alert.  - hold on ACEI until renal function improves -  monitor Cr - will need LifeVest prior to d/c  Acute CVA, likely embolic:  MRI reveals multiple small foci of acute ischemia c/w embolic event.  2D ECHO did not reveal thrombus but this is still a concern given reduced systolic fundtion, apical akinesis.  Neuro recommendations appreciated. - continue heparin gtt for A/C in the setting of probable cardioembolic CVA; Neuro feels he is not a good long-term coumadin candidate given his EtOH abuse; send out on ASA and Brilinta as above. - risk factor modification - stop heparin upon DC.  Hypertensive emergency:  Resolved. BPs stable. - will need appropriate HTN trx titrated after discharge  VDRF:  Per PCCM  EtOH abuse hx w/ seizure:  Monitor for w/d.  Will need CIWA protocol as sedative weaned off.  Keppra per Neuro.      LOS: 4 days   Candee Furbish, MD

## 2015-01-27 NOTE — Progress Notes (Signed)
PULMONARY / CRITICAL CARE MEDICINE   Name: Timothy Lambert MRN: [id number] DOB: 1956/04/10    ADMISSION DATE:  01/22/2015 CONSULTATION DATE:  01/27/2015  REFERRING MD :  EDP  CHIEF COMPLAINT:  Seizures  INITIAL PRESENTATION:  59 y.o. M brought to [facility] ED 5/4 with seizures.  In ED, was altered and extremely agitated requiring intubation for airway protection and further workup .  EKG with STEMI. Taken urgently to cath and PTCI to LAD. Back to ICU on MV   STUDIES:  CXR 5/4 >>> LLL atx vs infiltrate. CT head 5/4 >>> no acute findings, chronic atrophy and small vessel changes. EEG 5/4 >> no seizure focus or active seizures MRI brain 5/4 >> multiple small foci of acute ischemia in cerebrum and cerebellum, suggestive of embolic process.  TTE 5/5 >> ef 30% , moderate lvh. Extensive akinesis.  SIGNIFICANT EVENTS: 5/4 - admitted with seizures, intubated for airway protection and further workup, EKG findings concerning for ischemia.  Transferred to [facility] ICU for further evaluation and management. 5/5 extubated then flash edema and promptly reintubated.  SUBJECTIVE:  Currently sedated  VITAL SIGNS: Temp:  [100.2 F (37.9 C)-101.1 F (38.4 C)] 100.2 F (37.9 C) (05/08 0800) Pulse Rate:  [91-103] 95 (05/08 0800) Resp:  [18-24] 20 (05/08 0800) BP: (116-166)/(68-97) 150/80 mmHg (05/08 0800) SpO2:  [100 %] 100 % (05/08 0800) FiO2 (%):  [40 %] 40 % (05/08 0800) Weight:  [159 lb 6.3 oz (72.3 kg)] 159 lb 6.3 oz (72.3 kg) (05/08 0500) HEMODYNAMICS:   VENTILATOR SETTINGS: Vent Mode:  [-] PRVC FiO2 (%):  [40 %] 40 % Set Rate:  [16 bmp] 16 bmp Vt Set:  [510 mL] 510 mL PEEP:  [5 cmH20] 5 cmH20 Pressure Support:  [8 cmH20] 8 cmH20 Plateau Pressure:  [15 cmH20-18 cmH20] 16 cmH20 INTAKE / OUTPUT: Intake/Output      05/07 0701 - 05/08 0700 05/08 0701 - 05/09 0700   I.V. (mL/kg) 866.5 (12) 33 (0.5)   NG/GT 1740 30   IV Piggyback 100    Total Intake(mL/kg) 2706.5 (37.4) 63 (0.9)   Urine (mL/kg/hr)  2810 (1.6)    Total Output 2810     Net -103.5 +63         PHYSICAL EXAMINATION: General: Chronically ill appearing male, in NAD. WUA planned Neuro: Sedated  on vent HEENT: [place]/AT. PERRL, sclerae anicteric. Cardiovascular: RRR, no M/R/G.  Lungs: decreased bs bases, no distress but sedated Abdomen: BS x 4, soft, NT/ND.  Musculoskeletal: No gross deformities, no edema.  Skin: Intact, warm, no rashes.  LABS:  CBC  Recent Labs Lab 01/25/15 0400 01/26/15 0209 01/27/15 0453  WBC 10.9* 9.4 10.4  HGB 10.7* 10.9* 10.4*  HCT 30.1* 31.2* 30.0*  PLT 168 202 217   Coag's No results for input(s): APTT, INR in the last 168 hours. BMET  Recent Labs Lab 01/25/15 0400 01/26/15 0209 01/27/15 0453  NA 141 140 139  K 3.5 3.9 3.6  CL 110 107 101  CO2 22 23 26   BUN 25* 28* 38*  CREATININE 1.61* 1.81* 1.81*  GLUCOSE 136* 169* 274*   Electrolytes  Recent Labs Lab 01/25/15 0400 01/26/15 0209 01/27/15 0453  CALCIUM 8.2* 8.6* 8.4*  MG 1.9 1.7 1.7  PHOS 3.9 3.9 4.0   Sepsis Markers  Recent Labs Lab 01/22/15 2319 01/23/15 0530 01/23/15 0758  LATICACIDVEN 11.02* 2.3* 2.9*   ABG  Recent Labs Lab 01/23/15 0045 01/26/15 0355  PHART 7.301* 7.395  PCO2ART 56.6* 36.5  PO2ART 159.0* 139*   Liver Enzymes  Recent Labs Lab 01/22/15 2250 01/23/15 0530  AST 34 274*  ALT 20 44  ALKPHOS 72 50  BILITOT 0.6 0.8  ALBUMIN 4.6 3.4*   Cardiac Enzymes  Recent Labs Lab 01/22/15 2250 01/23/15 0309 01/23/15 0530  TROPONINI 0.08* >65.00* >65.00*   Glucose  Recent Labs Lab 01/26/15 1224 01/26/15 1626 01/26/15 2007 01/27/15 0033 01/27/15 0433 01/27/15 0726  GLUCAP 202* 198* 254* 282* 286* 244*   Imaging Dg Chest Port 1 View  01/26/2015   CLINICAL DATA:  Acute respiratory failure  EXAM: PORTABLE CHEST - 1 VIEW  COMPARISON:  01/25/2015  FINDINGS: The endotracheal tube tip is 3.7 cm above the carina. The nasogastric tube extends into the stomach. Mild airspace opacity  persists in the left base without significant interval change. There is no pneumothorax.  IMPRESSION: Support equipment appears satisfactorily positioned.  No significant interval change in the bilateral airspace opacities.   Electronically Signed   By: Andreas Newport M.D.   On: 01/26/2015 06:07   ASSESSMENT / PLAN:  CARDIOVASCULAR A:  Hypertensive emergency - initial SBP 230 on EMS arrival and down to 167 on ED arrival.  HTN could have potentially attributed to his seizure (vs ETOH withdrawal) STEMI - EKG with ST elevation in precordial leads with t wave inversions concerning for anteroseptal infarct S/p PTCI to LAD ? possible endocarditis given concern for neurological embolic event P:  Nitro gtt available prn  Heparin gtt , on brilinta ASA, metoprolol. Lipitor Echo as noted  PULMONARY OETT 5/4 >>> A: VDRF following GTC seizure, acute MI, multiple embolic  stroke P:   Full mechanical support, PS and attempt extubation 5/7. Failed 5/8 VAP bundle. Diureses as tolerated. Follow ABG and CXR Cardiac status may make extubation more difficult.  RENAL Lab Results  Component Value Date   CREATININE 1.81* 01/27/2015   CREATININE 1.81* 01/26/2015   CREATININE 1.61* 01/25/2015    A:   AGMA - lactate, improved AKI - unknown baseline renal function, progressive 5/5 but with adequate UOP Hypokalemia, resolved P:   KVO IVF Diuresis as tolerated K repletion as needed Follow BMP  GASTROINTESTINAL A:   GI prophylaxis Nutrition P:   SUP: Famotidine. Resume TF.  HEMATOLOGIC A:   VTE Prophylaxis P:  Heparin drip Follow CBC intermittently   INFECTIOUS A:   No indication of infection P:   Monitor clinically.  ENDOCRINE CBG (last 3)   Recent Labs  01/27/15 0033 01/27/15 0433 01/27/15 0726  GLUCAP 282* 286* 244*     A:   DM - not on outpatient meds P:   SSI change to moderate 5/8 and low dose lantus now back on tf NEUROLOGIC A:   Acute metabolic  encephalopathy(now follows commands) Seizure disorder - no hx of seizures.  Likely related to ETOH withdrawal vs less likely HTN emergency (initial head CT negative for any acute process) Acute scattered small strokes consistent with embolic process ETOH abuse UDS noted positive for benzo's - unsure pt's hx but likely that he received benzos from EMS prior to UDS collection P:   Sedation: Propofol gtt / Versed PRN. At risk for EtOH withdrawal as sedating gtts are weaned to off, will need CIWA coverage RASS goal: 0 to -1. Daily WUA. Keppra scheduled, Ativan PRN. Neurology following On ASA, no apparent contraindication to brilinta in setting CVA's >[place] note reviewed.  Family updated: No family bedside.  Interdisciplinary Family Meeting v Palliative Care Meeting:  Due by: 01/29/15.  Richardson Landry Minor ACNP Maryanna Shape PCCM Pager [phone] till 3 pm If no answer page [phone] 01/27/2015, 8:25 AM   Reviewed above, examined.  He is somnolent.  Tolerating pressure support better.  Heart rate regular.  Faint crackles at bases.  Abdomen soft.  Will continue pressure support weaning >> not ready for extubation yet.  Optimizing cardiac function with cardiology.  Continue AED's per neurology.  CC time by me independent of APP time 35 minutes.  Chesley Mires, MD [facility] Pulmonary/Critical Care 01/27/2015, 2:12 PM Pager:  [phone] After 3pm call: [phone]

## 2015-01-27 NOTE — Progress Notes (Signed)
ANTICOAGULATION CONSULT NOTE - Follow-up Consult  Pharmacy Consult for Heparin Indication: CVA  No Known Allergies  Patient Measurements: Height: 5\' 6"  (167.6 cm) Weight: 159 lb 6.3 oz (72.3 kg) IBW/kg (Calculated) : 63.8  Vital Signs: Temp: 100.2 F (37.9 C) (05/08 0800) Temp Source: Core (Comment) (05/08 0800) BP: 150/80 mmHg (05/08 0800) Pulse Rate: 95 (05/08 0800)  Labs:  Recent Labs  01/25/15 0400  01/26/15 0209  01/26/15 1238 01/26/15 1900 01/27/15 0453 01/27/15 0454  HGB 10.7*  --  10.9*  --   --   --  10.4*  --   HCT 30.1*  --  31.2*  --   --   --  30.0*  --   PLT 168  --  202  --   --   --  217  --   HEPARINUNFRC 0.28*  < >  --   < > 0.36 0.36  --  0.32  CREATININE 1.61*  --  1.81*  --   --   --  1.81*  --   < > = values in this interval not displayed.  Estimated Creatinine Clearance: 40.1 mL/min (by C-G formula based on Cr of 1.81).   Medical History: Past Medical History  Diagnosis Date  . Coronary artery disease   . Hypertension   . Diabetes mellitus without complication     Medications:  Scheduled:  . antiseptic oral rinse  7 mL Mouth Rinse QID  . aspirin  81 mg Oral Daily  . atorvastatin  80 mg Per Tube q1800  . carvedilol  3.125 mg Oral BID WC  . chlorhexidine  15 mL Mouth Rinse BID  . famotidine  20 mg Per Tube BID  . folic acid  1 mg Per Tube Daily  . furosemide  40 mg Intravenous Daily  . insulin aspart  0-15 Units Subcutaneous 6 times per day  . insulin glargine  10 Units Subcutaneous Daily  . levETIRAcetam  500 mg Per Tube BID  . thiamine  100 mg Per Tube Daily  . ticagrelor  90 mg Oral BID   Infusions:  . feeding supplement (VITAL AF 1.2 CAL) 1,000 mL (01/27/15 0700)  . heparin 1,500 Units/hr (01/27/15 0126)  . nitroGLYCERIN    . propofol (DIPRIVAN) infusion 10 mcg/kg/min (01/27/15 0750)    Assessment: Timothy Lambert is an 59 y.o. male transferred from [facility] admitted on 01/22/2015 presenting with seizures.  Patient extremely  agitated during intubation for airway protection.  EKG revealed pre-cordial ST changes concerning for infarct.  Transferred to [facility] ICU to undergo emergent cath once hemodynamically stabilized.  MRI 5/4 revealing acute scattered small strokes consistent with embolic process.  Pharmacy has been consulted to dose Heparin.    Hgb low stable, plt wnl.  CT neg for hemorrhage. No bleeding noted at this time.    Today's heparin level is therapeutic at 0.32.   Will continue rate and monitor.    Goal of Therapy:  Heparin level 0.3-0.5 units/ml Monitor platelets by anticoagulation protocol: Yes   Plan:  - Continue heparin at 1500 units/hr - Daily heparin level and CBC - Monitor for signs and symptoms of bleeding  Hassie Bruce, Pharm. D. Clinical Pharmacy Resident Pager: [phone] Ph: [phone] 01/27/2015 10:36 AM

## 2015-01-27 NOTE — Progress Notes (Signed)
RT note-Called to room for decreased sp02. Patient was suctioned several times. sp02 probe checked and off, probe was replaced and sp02 now 98%. Has weaned for 8 hours today. Continue to monitor.

## 2015-01-28 ENCOUNTER — Inpatient Hospital Stay (HOSPITAL_COMMUNITY): Payer: Medicaid Other

## 2015-01-28 ENCOUNTER — Encounter (HOSPITAL_COMMUNITY): Payer: Self-pay

## 2015-01-28 DIAGNOSIS — R4182 Altered mental status, unspecified: Secondary | ICD-10-CM

## 2015-01-28 DIAGNOSIS — R400 Somnolence: Secondary | ICD-10-CM

## 2015-01-28 LAB — GLUCOSE, CAPILLARY
GLUCOSE-CAPILLARY: 167 mg/dL — AB (ref 70–99)
GLUCOSE-CAPILLARY: 84 mg/dL (ref 70–99)
Glucose-Capillary: 158 mg/dL — ABNORMAL HIGH (ref 70–99)
Glucose-Capillary: 168 mg/dL — ABNORMAL HIGH (ref 70–99)
Glucose-Capillary: 211 mg/dL — ABNORMAL HIGH (ref 70–99)
Glucose-Capillary: 83 mg/dL (ref 70–99)
Glucose-Capillary: 97 mg/dL (ref 70–99)

## 2015-01-28 LAB — BLOOD GAS, ARTERIAL
Acid-Base Excess: 2.9 mmol/L — ABNORMAL HIGH (ref 0.0–2.0)
BICARBONATE: 27.5 meq/L — AB (ref 20.0–24.0)
Drawn by: 40530
FIO2: 0.4 %
LHR: 16 {breaths}/min
O2 Saturation: 99.3 %
PATIENT TEMPERATURE: 99.7
PEEP: 5 cmH2O
PH ART: 7.384 (ref 7.350–7.450)
TCO2: 28.9 mmol/L (ref 0–100)
VT: 510 mL
pCO2 arterial: 47.4 mmHg — ABNORMAL HIGH (ref 35.0–45.0)
pO2, Arterial: 151 mmHg — ABNORMAL HIGH (ref 80.0–100.0)

## 2015-01-28 LAB — BASIC METABOLIC PANEL
Anion gap: 12 (ref 5–15)
BUN: 47 mg/dL — AB (ref 6–20)
CALCIUM: 8.6 mg/dL — AB (ref 8.9–10.3)
CO2: 28 mmol/L (ref 22–32)
CREATININE: 1.83 mg/dL — AB (ref 0.61–1.24)
Chloride: 101 mmol/L (ref 101–111)
GFR calc Af Amer: 45 mL/min — ABNORMAL LOW (ref >60)
GFR, EST NON AFRICAN AMERICAN: 39 mL/min — AB (ref >60)
GLUCOSE: 209 mg/dL — AB (ref 70–99)
Potassium: 3.4 mmol/L — ABNORMAL LOW (ref 3.5–5.1)
Sodium: 141 mmol/L (ref 135–145)

## 2015-01-28 LAB — CBC
HEMATOCRIT: 30 % — AB (ref 39.0–52.0)
HEMOGLOBIN: 10.4 g/dL — AB (ref 13.0–17.0)
MCH: 30.4 pg (ref 26.0–34.0)
MCHC: 34.7 g/dL (ref 30.0–36.0)
MCV: 87.7 fL (ref 78.0–100.0)
Platelets: 228 10*3/uL (ref 150–400)
RBC: 3.42 MIL/uL — AB (ref 4.22–5.81)
RDW: 13.3 % (ref 11.5–15.5)
WBC: 10.6 10*3/uL — ABNORMAL HIGH (ref 4.0–10.5)

## 2015-01-28 LAB — HEPARIN LEVEL (UNFRACTIONATED): HEPARIN UNFRACTIONATED: 0.4 [IU]/mL (ref 0.30–0.70)

## 2015-01-28 LAB — MAGNESIUM: Magnesium: 1.8 mg/dL (ref 1.7–2.4)

## 2015-01-28 LAB — PHOSPHORUS: Phosphorus: 4.4 mg/dL (ref 2.5–4.6)

## 2015-01-28 LAB — HEMOGLOBIN A1C
Hgb A1c MFr Bld: 9.3 % — ABNORMAL HIGH (ref 4.8–5.6)
Mean Plasma Glucose: 220 mg/dL

## 2015-01-28 MED ORDER — METOPROLOL TARTRATE 1 MG/ML IV SOLN
5.0000 mg | Freq: Four times a day (QID) | INTRAVENOUS | Status: DC
  Administered 2015-01-28 – 2015-01-29 (×3): 5 mg via INTRAVENOUS
  Filled 2015-01-28 (×7): qty 5

## 2015-01-28 MED ORDER — FUROSEMIDE 10 MG/ML IJ SOLN
40.0000 mg | Freq: Once | INTRAMUSCULAR | Status: AC
  Administered 2015-01-28: 40 mg via INTRAVENOUS
  Filled 2015-01-28: qty 4

## 2015-01-28 MED ORDER — FAMOTIDINE 20 MG PO TABS
20.0000 mg | ORAL_TABLET | Freq: Two times a day (BID) | ORAL | Status: DC
Start: 2015-01-28 — End: 2015-01-29
  Administered 2015-01-29: 20 mg via ORAL
  Filled 2015-01-28 (×3): qty 1

## 2015-01-28 MED ORDER — LEVETIRACETAM 500 MG PO TABS
500.0000 mg | ORAL_TABLET | Freq: Two times a day (BID) | ORAL | Status: DC
  Administered 2015-01-28 – 2015-01-29 (×2): 500 mg via ORAL
  Filled 2015-01-28 (×3): qty 1

## 2015-01-28 NOTE — Progress Notes (Signed)
eLink Physician-Brief Progress Note Patient Name: Timothy Lambert DOB: 25-Oct-1955 MRN: [id number]   Date of Service  01/28/2015  HPI/Events of Note  Has po meds due tonight but not swallowing well yet  eICU Interventions  Change coreg to metoprolol Hold lipitor for now     Intervention Category Minor Interventions: Routine modifications to care plan (e.g. PRN medications for pain, fever)  MCQUAID, DOUGLAS 01/28/2015, 5:56 PM

## 2015-01-28 NOTE — Progress Notes (Signed)
PULMONARY / CRITICAL CARE MEDICINE   Name: Timothy Lambert MRN: [id number] DOB: 09-Aug-1956    ADMISSION DATE:  01/22/2015 CONSULTATION DATE:  01/28/2015  REFERRING MD :  EDP  CHIEF COMPLAINT:  Seizures  INITIAL PRESENTATION:  59 y.o. M brought to [facility] ED 5/4 with seizures.  In ED, was altered and extremely agitated requiring intubation for airway protection and further workup .  EKG with STEMI. Taken urgently to cath and PTCI to LAD. Back to ICU on MV   STUDIES:  CXR 5/4 >>> LLL atx vs infiltrate. CT head 5/4 >>> no acute findings, chronic atrophy and small vessel changes. EEG 5/4 >> no seizure focus or active seizures MRI brain 5/4 >> multiple small foci of acute ischemia in cerebrum and cerebellum, suggestive of embolic process.  TTE 5/5 >> ef 30% , moderate lvh. Extensive akinesis.  SIGNIFICANT EVENTS: 5/4 - admitted with seizures, intubated for airway protection and further workup, EKG findings concerning for ischemia.  Transferred to [facility] ICU for further evaluation and management. 5/5 extubated then flash edema and promptly reintubated.  SUBJECTIVE:  Currently sedated  VITAL SIGNS: Temp:  [99 F (37.2 C)-100.8 F (38.2 C)] 99.3 F (37.4 C) (05/09 0900) Pulse Rate:  [74-98] 87 (05/09 0900) Resp:  [14-22] 20 (05/09 0900) BP: (110-161)/(62-95) 135/76 mmHg (05/09 0900) SpO2:  [99 %-100 %] 100 % (05/09 0900) FiO2 (%):  [30 %-40 %] 30 % (05/09 0800) Weight:  [71.5 kg (157 lb 10.1 oz)] 71.5 kg (157 lb 10.1 oz) (05/09 0500) HEMODYNAMICS:   VENTILATOR SETTINGS: Vent Mode:  [-] PSV;CPAP FiO2 (%):  [30 %-40 %] 30 % Set Rate:  [16 bmp] 16 bmp Vt Set:  [510 mL] 510 mL PEEP:  [5 cmH20] 5 cmH20 Pressure Support:  [5 cmH20-8 cmH20] 5 cmH20 Plateau Pressure:  [14 cmH20-16 cmH20] 16 cmH20 INTAKE / OUTPUT: Intake/Output      05/08 0701 - 05/09 0700 05/09 0701 - 05/10 0700   I.V. (mL/kg) 765.9 (10.7) 63.2 (0.9)   NG/GT 870 60   IV Piggyback     Total Intake(mL/kg) 1635.9 (22.9)  123.2 (1.7)   Urine (mL/kg/hr) 2310 (1.3) 225 (0.9)   Stool 0 (0)    Total Output 2310 225   Net -674.1 -101.9        Stool Occurrence 3 x     PHYSICAL EXAMINATION: General: Chronically ill appearing male, in NAD. WUA planned Neuro: Sedated  on vent HEENT: [place]/AT. PERRL, sclerae anicteric. Cardiovascular: RRR, no M/R/G.  Lungs: decreased bs bases, no distress but sedated Abdomen: BS x 4, soft, NT/ND.  Musculoskeletal: No gross deformities, no edema.  Skin: Intact, warm, no rashes.  LABS:  CBC  Recent Labs Lab 01/26/15 0209 01/27/15 0453 01/28/15 0428  WBC 9.4 10.4 10.6*  HGB 10.9* 10.4* 10.4*  HCT 31.2* 30.0* 30.0*  PLT 202 217 228   Coag's No results for input(s): APTT, INR in the last 168 hours. BMET  Recent Labs Lab 01/26/15 0209 01/27/15 0453 01/28/15 0428  NA 140 139 141  K 3.9 3.6 3.4*  CL 107 101 101  CO2 23 26 28   BUN 28* 38* 47*  CREATININE 1.81* 1.81* 1.83*  GLUCOSE 169* 274* 209*   Electrolytes  Recent Labs Lab 01/26/15 0209 01/27/15 0453 01/28/15 0428  CALCIUM 8.6* 8.4* 8.6*  MG 1.7 1.7 1.8  PHOS 3.9 4.0 4.4   Sepsis Markers  Recent Labs Lab 01/22/15 2319 01/23/15 0530 01/23/15 0758  LATICACIDVEN 11.02* 2.3* 2.9*   ABG  Recent Labs Lab 01/23/15 0045 01/26/15 0355 01/28/15 0445  PHART 7.301* 7.395 7.384  PCO2ART 56.6* 36.5 47.4*  PO2ART 159.0* 139* 151*   Liver Enzymes  Recent Labs Lab 01/22/15 2250 01/23/15 0530  AST 34 274*  ALT 20 44  ALKPHOS 72 50  BILITOT 0.6 0.8  ALBUMIN 4.6 3.4*   Cardiac Enzymes  Recent Labs Lab 01/22/15 2250 01/23/15 0309 01/23/15 0530  TROPONINI 0.08* >65.00* >65.00*   Glucose  Recent Labs Lab 01/27/15 0726 01/27/15 1208 01/27/15 1620 01/27/15 2013 01/28/15 0029 01/28/15 0423  GLUCAP 244* 279* 217* 176* 158* 211*   Imaging Dg Chest Port 1 View  01/27/2015   CLINICAL DATA:  Respiratory failure. Intubated patient. History of coronary artery disease, hypertension and  diabetes.  EXAM: PORTABLE CHEST - 1 VIEW  COMPARISON:  01/26/2015  FINDINGS: Endotracheal tube tip projects 2 cm above the carina. Nasogastric tube and right internal jugular central venous line are stable and well positioned. Mild hazy opacity in the medial left lung base appears mildly improved. This could reflect pneumonia or atelectasis. Remainder of the lungs is clear. No pneumothorax.  IMPRESSION: 1. Mild improvement in left lung base opacity which may reflect improved pneumonia or improved atelectasis. No new lung abnormalities. 2. Support apparatus is well positioned.   Electronically Signed   By: Lajean Manes M.D.   On: 01/27/2015 07:45   ASSESSMENT / PLAN:  CARDIOVASCULAR A:  Hypertensive emergency - initial SBP 230 on EMS arrival and down to 167 on ED arrival.  HTN could have potentially attributed to his seizure (vs ETOH withdrawal) STEMI - EKG with ST elevation in precordial leads with t wave inversions concerning for anteroseptal infarct S/p PTCI to LAD ? possible endocarditis given concern for neurological embolic event P:  Nitro gtt available prn but currently off. Heparin gtt, on brilinta. ASA, metoprolol. Lipitor. Echo as noted.  PULMONARY OETT 5/4 >>> A: VDRF following GTC seizure, acute MI, multiple embolic  stroke P:   SBT to extubate today. Swallow evaluation. Ambulate. Diureses as tolerated.  RENAL Lab Results  Component Value Date   CREATININE 1.83* 01/28/2015   CREATININE 1.81* 01/27/2015   CREATININE 1.81* 01/26/2015  A:   AGMA - lactate, improved AKI - unknown baseline renal function, progressive 5/5 but with adequate UOP Hypokalemia, resolved P:   KVO IVF Diuresis as ordered. K repletion as needed Follow BMP  GASTROINTESTINAL A:   GI prophylaxis Nutrition P:   SUP: Famotidine. Resume TF.  HEMATOLOGIC A:   VTE Prophylaxis P:  Heparin drip Follow CBC intermittently   INFECTIOUS A:   No indication of infection P:   Monitor  clinically.  ENDOCRINE CBG (last 3)   Recent Labs  01/27/15 2013 01/28/15 0029 01/28/15 0423  GLUCAP 176* 158* 211*     A:   DM - not on outpatient meds P:   SSI change to moderate 5/8 and low dose lantus now back on tf NEUROLOGIC A:   Acute metabolic encephalopathy(now follows commands) Seizure disorder - no hx of seizures.  Likely related to ETOH withdrawal vs less likely HTN emergency (initial head CT negative for any acute process) Acute scattered small strokes consistent with embolic process ETOH abuse UDS noted positive for benzo's - unsure pt's hx but likely that he received benzos from EMS prior to UDS collection P:   Sedation: D/C all sedation. Daily WUA. Keppra scheduled, Ativan PRN. Neurology following. On ASA, no apparent contraindication to brilinta in setting CVA's >Nuero note reviewed.  Family updated: Wife updated bedside.  Interdisciplinary Family Meeting v Palliative Care Meeting:  Due by: 01/29/15.  The patient is critically ill with multiple organ systems failure and requires high complexity decision making for assessment and support, frequent evaluation and titration of therapies, application of advanced monitoring technologies and extensive interpretation of multiple databases.   Critical Care Time devoted to patient care services described in this note is  35  Minutes. This time reflects time of care of this signee Dr Jennet Maduro. This critical care time does not reflect procedure time, or teaching time or supervisory time of PA/NP/Med student/Med Resident etc but could involve care discussion time.  Rush Farmer, M.D. [facility] Pulmonary/Critical Care Medicine. Pager: [phone]. After hours pager: [phone].  01/28/2015, 10:41 AM

## 2015-01-28 NOTE — Progress Notes (Signed)
Cardiology Progress Note  Subjective: No acute events overnight. Timothy Lambert was seen and examined this morning.  Wife, Timothy Lambert at bedside Had weaning trial yesterday  Objective: Vital signs in last 24 hours: Filed Vitals:   01/28/15 0500 01/28/15 0600 01/28/15 0717 01/28/15 0821  BP: 121/67 138/76 126/75 137/77  Pulse: 87 85 86 88  Temp: 99.5 F (37.5 C) 99 F (37.2 C)    TempSrc: Core (Comment) Core (Comment)    Resp: 18 20 22    Height:      Weight: 157 lb 10.1 oz (71.5 kg)     SpO2: 100% 100% 100%    Weight change: -1 lb 12.2 oz (-0.8 kg)  Intake/Output Summary (Last 24 hours) at 01/28/15 0823 Last data filed at 01/28/15 0600  Gross per 24 hour  Intake 1504.82 ml  Output   2310 ml  Net -805.18 ml   General: sedate HEENT: [place]/AT, ETT in place Cardiac: RRR, no rubs, murmus or gallops; R radial artery bandage is c/d/i; 2+ radials/DPs  Pulm: + rhonchi, no rales; SpO2 100% on vent with FiO2 30% Abd: soft, nontender, nondistended, +BS GU:   Foley bag Ext: 2+ DPs, no pedal edema Neuro: sedated and intubated  Lab Results: Basic Metabolic Panel:  Recent Labs Lab 01/27/15 0453 01/28/15 0428  NA 139 141  K 3.6 3.4*  CL 101 101  CO2 26 28  GLUCOSE 274* 209*  BUN 38* 47*  CREATININE 1.81* 1.83*  CALCIUM 8.4* 8.6*  MG 1.7 1.8  PHOS 4.0 4.4    CBC:  Recent Labs Lab 01/22/15 2250  01/27/15 0453 01/28/15 0428  WBC 15.3*  < > 10.4 10.6*  NEUTROABS 11.1*  --   --   --   HGB 14.2  < > 10.4* 10.4*  HCT 39.5  < > 30.0* 30.0*  MCV 86.4  < > 88.5 87.7  PLT 264  < > 217 228  < > = values in this interval not displayed. Cardiac Enzymes:  Recent Labs Lab 01/22/15 2250 01/23/15 0309 01/23/15 0530  TROPONINI 0.08* >65.00* >65.00*   CBG:  Recent Labs Lab 01/27/15 0726 01/27/15 1208 01/27/15 1620 01/27/15 2013 01/28/15 0029 01/28/15 0423  GLUCAP 244* 279* 217* 176* 158* 211*   Fasting Lipid Panel:  Recent Labs Lab 01/26/15 0349 01/26/15 0350  CHOL  143  --   HDL 40*  --   LDLCALC 70  --   TRIG 167* 172*  CHOLHDL 3.6  --    Studies/Results: Dg Chest Port 1 View  01/28/2015   CLINICAL DATA:  Respiratory failure.  EXAM: PORTABLE CHEST - 1 VIEW  COMPARISON:  None.  FINDINGS: Endotracheal tube and NG tube in stable position. Right IJ line stable position. Mediastinum and hilar structures are normal. Stable cardiomegaly. Stable bibasilar subsegmental atelectasis and/or infiltrates. No pleural effusion or pneumothorax  IMPRESSION: 1. Lines and tubes in stable position. 2. Stable bibasilar atelectasis and/or infiltrates.   Electronically Signed   By: Forrest   On: 01/28/2015 07:07   Dg Chest Port 1 View  01/27/2015   CLINICAL DATA:  Respiratory failure. Intubated patient. History of coronary artery disease, hypertension and diabetes.  EXAM: PORTABLE CHEST - 1 VIEW  COMPARISON:  01/26/2015  FINDINGS: Endotracheal tube tip projects 2 cm above the carina. Nasogastric tube and right internal jugular central venous line are stable and well positioned. Mild hazy opacity in the medial left lung base appears mildly improved. This could reflect pneumonia or atelectasis. Remainder of the  lungs is clear. No pneumothorax.  IMPRESSION: 1. Mild improvement in left lung base opacity which may reflect improved pneumonia or improved atelectasis. No new lung abnormalities. 2. Support apparatus is well positioned.   Electronically Signed   By: Lajean Manes M.D.   On: 01/27/2015 07:45   Medications: I have reviewed the patient's current medications. Scheduled Meds: . antiseptic oral rinse  7 mL Mouth Rinse QID  . aspirin  81 mg Oral Daily  . atorvastatin  80 mg Per Tube q1800  . carvedilol  3.125 mg Oral BID WC  . chlorhexidine  15 mL Mouth Rinse BID  . famotidine  20 mg Per Tube BID  . folic acid  1 mg Per Tube Daily  . furosemide  40 mg Intravenous Once  . insulin aspart  0-15 Units Subcutaneous 6 times per day  . insulin glargine  10 Units Subcutaneous  Daily  . levETIRAcetam  500 mg Per Tube BID  . thiamine  100 mg Per Tube Daily  . ticagrelor  90 mg Oral BID   Continuous Infusions: . feeding supplement (VITAL AF 1.2 CAL) 1,000 mL (01/27/15 2000)  . heparin 1,500 Units/hr (01/27/15 2000)  . nitroGLYCERIN    . propofol (DIPRIVAN) infusion 30 mcg/kg/min (01/28/15 0300)   PRN Meds:.sodium chloride, acetaminophen, docusate, fentaNYL (SUBLIMAZE) injection, LORazepam, metoprolol, midazolam, ondansetron (ZOFRAN) IV   2D ECHO:  01/24/15 Study Conclusions  - Left ventricle: The cavity size was normal. Wall thickness was increased in a pattern of moderate LVH. Systolic function was moderately to severely reduced. The estimated ejection fraction was in the range of 30% to 35%. Mid to apical anteroseptal akinesis, apical lateral akinesis, apical inferior akinesis, akinesis of true apex. Anterior hypokinesis. Doppler parameters are consistent with abnormal left ventricular relaxation (grade 1 diastolic dysfunction). - Aortic valve: There was no stenosis. - Mitral valve: There was trivial regurgitation. - Left atrium: The atrium was mildly dilated. - Right ventricle: The cavity size was normal. Systolic function was normal. - Pulmonary arteries: PA peak pressure: 66 mm Hg (S). - Systemic veins: IVC measured 2.5 cm with < 50% respirophasic variation, suggesting RA pressure 15 mmHg. - Pericardium, extracardiac: A trivial pericardial effusion was identified.  Impressions:  - Normal LV size with moderate LV hypertrophy. EF 30-35% with wall motion abnormalities as noted above. Normal RV size and systolic function. No significant valvular abnormalities. Moderate pulmonary hypertension.  Telemetry:  NSR, HR 90s  Assessment/Plan:  STEMI:  Due to 99% LAD stenosis.  S/p PCI with DES to LAD on 01/23/15.  No rubs or murmurs on exam.  2D ECHO revealed reduced systolic function, EF 99991111, apical akinesis and anterior  hypokinesis.   - continue ASA 81mg , Coreg 3.125mg  BID (may be able to increase once more alert and BP increased), Lipitor 80mg  - will wait for drop in Cr before initiating ACEI - will need LifeVest; plan to re-evaluate for need for ICD in 90 days - continue ASA and Brilinta x at least 12 months post DES  Acute decompensated systolic and diastolic HF:  Volume status appears euvolemic.  Now net neg 3.1L after starting Lasix .  Cr 1.61 --> 1.81 --> 1.81-->1.83 stable. IV Lasix 40mg  QD.  CXR appears improved.  - continue Lasix to 40mg  IV daily gave dose this AM. - strict I&Os and daily weights - continue low dose Coreg 3.125mg  BID;plan on increase once extubated and more alert.  - hold on ACEI until renal function improves - monitor Cr - will  need LifeVest prior to d/c  Acute CVA, likely embolic:  MRI reveals multiple small foci of acute ischemia c/w embolic event.  2D ECHO did not reveal thrombus but this is still a concern given reduced systolic fundtion, apical akinesis.  Neuro recommendations appreciated. - continue heparin gtt for A/C in the setting of probable cardioembolic CVA; Neuro feels he is not a good long-term coumadin candidate given his EtOH abuse; send out on ASA and Brilinta as above. - risk factor modification - stop heparin upon DC.  Hypertensive emergency:  Resolved. BPs stable. - will need appropriate HTN trx titrated after discharge  VDRF:  Per PCCM  EtOH abuse hx w/ seizure:  Monitor for w/d.  Will need CIWA protocol as sedative weaned off.  Keppra per Neuro.      LOS: 5 days   Candee Furbish, MD

## 2015-01-28 NOTE — Progress Notes (Signed)
ANTICOAGULATION CONSULT NOTE - Follow-up Consult  Pharmacy Consult for Heparin Indication: CVA  No Known Allergies  Patient Measurements: Height: 5\' 6"  (167.6 cm) Weight: 157 lb 10.1 oz (71.5 kg) IBW/kg (Calculated) : 63.8  Vital Signs: Temp: 99.3 F (37.4 C) (05/09 0900) Temp Source: Core (Comment) (05/09 0800) BP: 135/76 mmHg (05/09 0900) Pulse Rate: 87 (05/09 0900)  Labs:  Recent Labs  01/26/15 0209  01/26/15 1900 01/27/15 0453 01/27/15 0454 01/28/15 0428  HGB 10.9*  --   --  10.4*  --  10.4*  HCT 31.2*  --   --  30.0*  --  30.0*  PLT 202  --   --  217  --  228  HEPARINUNFRC  --   < > 0.36  --  0.32 0.40  CREATININE 1.81*  --   --  1.81*  --  1.83*  < > = values in this interval not displayed.  Estimated Creatinine Clearance: 39.7 mL/min (by C-G formula based on Cr of 1.83).   Medical History: Past Medical History  Diagnosis Date  . Coronary artery disease   . Hypertension   . Diabetes mellitus without complication     Medications:  Scheduled:  . antiseptic oral rinse  7 mL Mouth Rinse QID  . aspirin  81 mg Oral Daily  . atorvastatin  80 mg Per Tube q1800  . carvedilol  3.125 mg Oral BID WC  . chlorhexidine  15 mL Mouth Rinse BID  . famotidine  20 mg Per Tube BID  . folic acid  1 mg Per Tube Daily  . furosemide  40 mg Intravenous Once  . insulin aspart  0-15 Units Subcutaneous 6 times per day  . insulin glargine  10 Units Subcutaneous Daily  . levETIRAcetam  500 mg Per Tube BID  . thiamine  100 mg Per Tube Daily  . ticagrelor  90 mg Oral BID   Infusions:  . feeding supplement (VITAL AF 1.2 CAL) 1,000 mL (01/28/15 0700)  . heparin 1,500 Units/hr (01/28/15 0700)  . nitroGLYCERIN    . propofol (DIPRIVAN) infusion 10 mcg/kg/min (01/28/15 0730)    Assessment: Timothy Lambert is an 59 y.o. male transferred from [facility] admitted on 01/22/2015 presenting with seizures.  Patient extremely agitated during intubation for airway protection.  EKG revealed  pre-cordial ST changes concerning for infarct.  Transferred to [facility] ICU to undergo emergent cath once hemodynamically stabilized.  MRI 5/4 revealing acute scattered small strokes consistent with embolic process.  Pharmacy has been consulted to dose Heparin.    Hgb stable, plt wnl.  CT neg for hemorrhage. Some bleeding noted, likely from throat irritation but not significant. Plans for extubation later today.  Nurse will notify pharmacy if bleeding worsens.    Today's heparin level is stable and therapeutic at 0.4 at 1500 units/hr.   Will continue current rate and monitor for signs of bleeding.     Goal of Therapy:  Heparin level 0.3-0.5 units/ml Monitor platelets by anticoagulation protocol: Yes   Plan:  - Continue heparin at 1500 units/hr - Daily heparin level and CBC - Monitor for signs and symptoms of bleeding  Theron Arista, PharmD Clinical Pharmacist - Resident Pager: [phone] 5/9/20169:42 AM

## 2015-01-28 NOTE — Progress Notes (Signed)
Inpatient Diabetes Program Recommendations  AACE/ADA: New Consensus Statement on Inpatient Glycemic Control (2013)  Target Ranges:  Prepandial:   less than 140 mg/dL      Peak postprandial:   less than 180 mg/dL (1-2 hours)      Critically ill patients:  140 - 180 mg/dL   Results for ERMAN, IKNER (MRN [id number]) as of 01/28/2015 07:53  Ref. Range 01/27/2015 04:33 01/27/2015 07:26 01/27/2015 12:08 01/27/2015 16:20 01/27/2015 20:13 01/28/2015 00:29 01/28/2015 04:23  Glucose-Capillary Latest Ref Range: 70-99 mg/dL 286 (H) 244 (H) 279 (H) 217 (H) 176 (H) 158 (H) 211 (H)   Current orders for Inpatient glycemic control: Lantus 10 unitd daily, Novolog 0-15 units Q4H  Inpatient Diabetes Program Recommendations Insulin - Basal: Please consider increasing Lantus to 15 units daily. Correction (SSI): Please consider increasing Novolog correction to Resistant scale or change to ICU Glycemic Control order set to improve glycemic control. Insulin - Tube Feeding Coverage: While receiving tube feeding, please consider ordering Novolog 4 units Q4H for tube feeding coverage.  Thanks, Barnie Alderman, RN, MSN, CCRN, CDE Diabetes Coordinator Inpatient Diabetes Program [phone] (Team Pager from [place] to [place]) [phone] ([facility] office) [phone] [facility] office)

## 2015-01-28 NOTE — Procedures (Signed)
Extubation Procedure Note  Patient Details:   Name: Timothy Lambert DOB: August 04, 1956 MRN: [id number]   Airway Documentation:     Evaluation  O2 sats: stable throughout Complications: No apparent complications Patient did tolerate procedure well. Bilateral Breath Sounds: Rhonchi, Diminished Suctioning: Airway No   No complications noted. Patient placed on 4L [place].  Beatris Si D 01/28/2015, 10:52 AM

## 2015-01-29 ENCOUNTER — Inpatient Hospital Stay (HOSPITAL_COMMUNITY): Payer: Medicaid Other

## 2015-01-29 LAB — GLUCOSE, CAPILLARY
GLUCOSE-CAPILLARY: 96 mg/dL (ref 70–99)
Glucose-Capillary: 104 mg/dL — ABNORMAL HIGH (ref 70–99)
Glucose-Capillary: 118 mg/dL — ABNORMAL HIGH (ref 70–99)
Glucose-Capillary: 169 mg/dL — ABNORMAL HIGH (ref 70–99)
Glucose-Capillary: 103 mg/dL — ABNORMAL HIGH (ref 70–99)

## 2015-01-29 LAB — BASIC METABOLIC PANEL
Anion gap: 12 (ref 5–15)
BUN: 42 mg/dL — AB (ref 6–20)
CALCIUM: 8.5 mg/dL — AB (ref 8.9–10.3)
CO2: 25 mmol/L (ref 22–32)
Chloride: 104 mmol/L (ref 101–111)
Creatinine, Ser: 1.47 mg/dL — ABNORMAL HIGH (ref 0.61–1.24)
GFR calc Af Amer: 59 mL/min — ABNORMAL LOW (ref >60)
GFR calc non Af Amer: 51 mL/min — ABNORMAL LOW (ref >60)
GLUCOSE: 106 mg/dL — AB (ref 70–99)
POTASSIUM: 3.1 mmol/L — AB (ref 3.5–5.1)
Sodium: 141 mmol/L (ref 135–145)

## 2015-01-29 LAB — PHOSPHORUS: PHOSPHORUS: 3.9 mg/dL (ref 2.5–4.6)

## 2015-01-29 LAB — CBC
HCT: 31.7 % — ABNORMAL LOW (ref 39.0–52.0)
Hemoglobin: 10.9 g/dL — ABNORMAL LOW (ref 13.0–17.0)
MCH: 30 pg (ref 26.0–34.0)
MCHC: 34.4 g/dL (ref 30.0–36.0)
MCV: 87.3 fL (ref 78.0–100.0)
PLATELETS: 258 10*3/uL (ref 150–400)
RBC: 3.63 MIL/uL — ABNORMAL LOW (ref 4.22–5.81)
RDW: 13 % (ref 11.5–15.5)
WBC: 10.9 10*3/uL — AB (ref 4.0–10.5)

## 2015-01-29 LAB — TRIGLYCERIDES: TRIGLYCERIDES: 192 mg/dL — AB (ref <150)

## 2015-01-29 LAB — MAGNESIUM: Magnesium: 1.7 mg/dL (ref 1.7–2.4)

## 2015-01-29 LAB — HEPARIN LEVEL (UNFRACTIONATED): Heparin Unfractionated: 0.36 IU/mL (ref 0.30–0.70)

## 2015-01-29 MED ORDER — MAGNESIUM SULFATE 2 GM/50ML IV SOLN
2.0000 g | Freq: Once | INTRAVENOUS | Status: AC
  Administered 2015-01-29: 2 g via INTRAVENOUS
  Filled 2015-01-29: qty 50

## 2015-01-29 MED ORDER — LABETALOL HCL 100 MG PO TABS
100.0000 mg | ORAL_TABLET | Freq: Three times a day (TID) | ORAL | Status: DC
Start: 2015-01-29 — End: 2015-01-29

## 2015-01-29 MED ORDER — POTASSIUM CHLORIDE 20 MEQ/15ML (10%) PO SOLN
40.0000 meq | Freq: Three times a day (TID) | ORAL | Status: AC
  Administered 2015-01-29 (×2): 40 meq
  Filled 2015-01-29 (×2): qty 30

## 2015-01-29 MED ORDER — POTASSIUM CHLORIDE 20 MEQ/15ML (10%) PO SOLN: 30.0000 meq | ORAL | Status: DC

## 2015-01-29 MED ORDER — LABETALOL HCL 5 MG/ML IV SOLN
10.0000 mg | Freq: Once | INTRAVENOUS | Status: AC
  Administered 2015-01-29: 10 mg via INTRAVENOUS
  Filled 2015-01-29: qty 4

## 2015-01-29 MED ORDER — ASPIRIN 81 MG PO CHEW
81.0000 mg | CHEWABLE_TABLET | Freq: Every day | ORAL | Status: DC
  Administered 2015-01-30 – 2015-02-01 (×3): 81 mg
  Filled 2015-01-29 (×3): qty 1

## 2015-01-29 MED ORDER — CARVEDILOL 6.25 MG PO TABS
6.2500 mg | ORAL_TABLET | Freq: Two times a day (BID) | ORAL | Status: DC
  Filled 2015-01-29 (×3): qty 1

## 2015-01-29 MED ORDER — TICAGRELOR 90 MG PO TABS
90.0000 mg | ORAL_TABLET | Freq: Two times a day (BID) | ORAL | Status: DC
  Administered 2015-01-29 – 2015-02-01 (×5): 90 mg
  Filled 2015-01-29 (×7): qty 1

## 2015-01-29 MED ORDER — CARVEDILOL 12.5 MG PO TABS
12.5000 mg | ORAL_TABLET | Freq: Two times a day (BID) | ORAL | Status: DC
  Administered 2015-01-29 – 2015-01-30 (×2): 12.5 mg
  Filled 2015-01-29 (×4): qty 1

## 2015-01-29 MED ORDER — JEVITY 1.2 CAL PO LIQD
1000.0000 mL | ORAL | Status: DC
  Administered 2015-01-29: 20 mL/h
  Filled 2015-01-29 (×7): qty 1000

## 2015-01-29 MED ORDER — CARVEDILOL 6.25 MG PO TABS
6.2500 mg | ORAL_TABLET | Freq: Two times a day (BID) | ORAL | Status: DC
  Filled 2015-01-29 (×2): qty 1

## 2015-01-29 MED ORDER — POTASSIUM CHLORIDE 10 MEQ/50ML IV SOLN
10.0000 meq | INTRAVENOUS | Status: AC
  Administered 2015-01-29 (×4): 10 meq via INTRAVENOUS
  Filled 2015-01-29 (×4): qty 50

## 2015-01-29 MED ORDER — FAMOTIDINE 20 MG PO TABS
20.0000 mg | ORAL_TABLET | Freq: Two times a day (BID) | ORAL | Status: DC
  Administered 2015-01-29 – 2015-02-01 (×5): 20 mg
  Filled 2015-01-29 (×7): qty 1

## 2015-01-29 MED ORDER — LISINOPRIL 5 MG PO TABS
5.0000 mg | ORAL_TABLET | Freq: Every day | ORAL | Status: DC
  Administered 2015-01-29 – 2015-01-30 (×2): 5 mg
  Filled 2015-01-29 (×2): qty 1

## 2015-01-29 MED ORDER — LEVETIRACETAM 100 MG/ML PO SOLN
500.0000 mg | Freq: Two times a day (BID) | ORAL | Status: DC
  Administered 2015-01-29 – 2015-01-30 (×2): 500 mg
  Filled 2015-01-29 (×4): qty 5

## 2015-01-29 NOTE — Progress Notes (Signed)
Attempted to call report Nurse Dara not ready for report. Message left with Psychologist, forensic.

## 2015-01-29 NOTE — Progress Notes (Addendum)
ANTICOAGULATION CONSULT NOTE - Follow-up Consult  Pharmacy Consult for Heparin Indication: CVA  No Known Allergies  Patient Measurements: Height: 5\' 6"  (167.6 cm) Weight: 145 lb 8.1 oz (66 kg) IBW/kg (Calculated) : 63.8  Vital Signs: Temp: 98.6 F (37 C) (05/10 0612) Temp Source: Core (Comment) (05/09 2000) BP: 177/85 mmHg (05/10 0612) Pulse Rate: 84 (05/10 0612)  Labs:  Recent Labs  01/27/15 0453 01/27/15 0454 01/28/15 0428 01/29/15 0430  HGB 10.4*  --  10.4* 10.9*  HCT 30.0*  --  30.0* 31.7*  PLT 217  --  228 258  HEPARINUNFRC  --  0.32 0.40 0.36  CREATININE 1.81*  --  1.83* 1.47*    Estimated Creatinine Clearance: 49.4 mL/min (by C-G formula based on Cr of 1.47).   Medical History: Past Medical History  Diagnosis Date  . Coronary artery disease   . Hypertension   . Diabetes mellitus without complication     Medications:  Scheduled:  . antiseptic oral rinse  7 mL Mouth Rinse QID  . aspirin  81 mg Oral Daily  . atorvastatin  80 mg Per Tube q1800  . chlorhexidine  15 mL Mouth Rinse BID  . famotidine  20 mg Oral BID  . folic acid  1 mg Per Tube Daily  . insulin aspart  0-15 Units Subcutaneous 6 times per day  . insulin glargine  10 Units Subcutaneous Daily  . levETIRAcetam  500 mg Oral BID  . metoprolol  5 mg Intravenous 4 times per day  . potassium chloride  10 mEq Intravenous Q1 Hr x 4  . thiamine  100 mg Per Tube Daily  . ticagrelor  90 mg Oral BID   Infusions:  . feeding supplement (VITAL AF 1.2 CAL) Stopped (01/28/15 1000)  . heparin 1,500 Units/hr (01/29/15 [id number])  . nitroGLYCERIN    . propofol (DIPRIVAN) infusion Stopped (01/28/15 1000)    Assessment: Timothy Lambert is an 59 y.o. male transferred from [facility] admitted on 01/22/2015 presenting with seizures.  Patient extremely agitated during intubation for airway protection.  EKG revealed pre-cordial ST changes concerning for infarct.  Transferred to [facility] ICU to undergo emergent cath once  hemodynamically stabilized.  MRI 5/4 revealing acute scattered small strokes consistent with embolic process.  Pharmacy has been consulted to dose Heparin.    Hgb stable, plt wnl.  CT neg for hemorrhage. Some bleeding noted, likely from throat irritation but not significant.   Today's heparin level is stable and therapeutic at 0.36 at 1500 units/hr.   Will continue current rate and monitor for signs of bleeding.     Goal of Therapy:  Heparin level 0.3-0.5 units/ml Monitor platelets by anticoagulation protocol: Yes   Plan:  - Continue heparin at 1500 units/hr - Daily heparin level and CBC - Monitor for signs and symptoms of bleeding  ADDENDUM  Heparin gtt has been discontinued.  Hassie Bruce, Pharm. D. Clinical Pharmacy Resident Pager: [phone] Ph: [phone] 01/29/2015 7:15 AM

## 2015-01-29 NOTE — Progress Notes (Signed)
Patient pulled out NG tube, will replace

## 2015-01-29 NOTE — Evaluation (Signed)
Clinical/Bedside Swallow Evaluation Patient Details  Name: Timothy Lambert MRN: [id number] Date of Birth: June 24, 1956  Today's Date: 01/29/2015 Time: SLP Start Time (ACUTE ONLY): [id number] SLP Stop Time (ACUTE ONLY): 0948 SLP Time Calculation (min) (ACUTE ONLY): 15 min  Past Medical History:  Past Medical History  Diagnosis Date  . Coronary artery disease   . Hypertension   . Diabetes mellitus without complication    Past Surgical History:  Past Surgical History  Procedure Laterality Date  . Cardiac catheterization N/A 01/23/2015    Procedure: Left Heart Cath and Coronary Angiography;  Surgeon: [name], MD;  Location: [facility] INVASIVE CV LAB CUPID;  Service: Cardiovascular;  Laterality: N/A;  . Cardiac catheterization N/A 01/23/2015    Procedure: Coronary Stent Intervention;  Surgeon: [name], MD;  Location: [facility] INVASIVE CV LAB CUPID;  Service: Cardiovascular;  Laterality: N/A;   HPI:  58 y.o. M brought to [facility] ED 5/4 with seizures. In ED, was altered and extremely agitated requiring intubation for airway protection . EKG with STEMI. Taken urgently to cath and PTCI to LAD. Extubated 5/6 with reintubation, extubated 5/9   Assessment / Plan / Recommendation Clinical Impression  Pt presents with a probable acute reversible dysphagia s/p intubation and exacerbated by MS.  Presents with poor quality, breathy phonation; weak cough.  All tested consistencies led to immediate, weak cough, suggesting inadequate laryngeal closure during the swallow.  Pt high risk for aspiration at this time.  Recommend continuing NPO status for today - SLP will f/u next date for improvements/readiness for POs.  D/W Timothy Lambert, who verbalizes understanding.      Aspiration Risk  Severe    Diet Recommendation NPO        Other  Recommendations Oral Care Recommendations: Oral care QID   Follow Up Recommendations       Frequency and Duration    1 week            Swallow Study Prior Functional  Status  Type of Home: House    General Date of Onset: 01/22/15 Other Pertinent Information: 59 y.o. M brought to [facility] ED 5/4 with seizures. In ED, was altered and extremely agitated requiring intubation for airway protection . EKG with STEMI. Taken urgently to cath and PTCI to LAD. Extubated 5/6 with reintubation, extubated 5/9 Type of Study: Bedside swallow evaluation Previous Swallow Assessment: no Diet Prior to this Study: NPO Temperature Spikes Noted: No Respiratory Status: Room air History of Recent Intubation: Yes Length of Intubations (days): 5 days (intubated x2) Date extubated: 01/28/15 Behavior/Cognition: Lethargic/Drowsy Oral Cavity - Dentition: Edentulous Self-Feeding Abilities: Needs assist Patient Positioning: Upright in chair/Tumbleform Baseline Vocal Quality: Hoarse;Breathy;Low vocal intensity Volitional Cough: Weak Volitional Swallow: Able to elicit    Oral/Motor/Sensory Function Overall Oral Motor/Sensory Function: Appears within functional limits for tasks assessed   Ice Chips Ice chips: Impaired Presentation: Spoon Pharyngeal Phase Impairments: Suspected delayed Swallow;Decreased hyoid-laryngeal movement;Cough - Immediate;Cough - Delayed   Thin Liquid Thin Liquid: Impaired Presentation: Spoon Pharyngeal  Phase Impairments: Suspected delayed Swallow;Decreased hyoid-laryngeal movement;Throat Clearing - Immediate;Cough - Immediate;Cough - Delayed    Nectar Thick Nectar Thick Liquid: Not tested   Honey Thick Honey Thick Liquid: Not tested   Puree Puree: Impaired Presentation: Spoon Pharyngeal Phase Impairments: Suspected delayed Swallow;Decreased hyoid-laryngeal movement;Cough - Delayed   Solid  Timothy Lambert L. [place], [place] CCC/SLP Pager [phone]     Solid: Not tested       Timothy Lambert 01/29/2015,9:55 AM

## 2015-01-29 NOTE — Progress Notes (Signed)
Report called to [place] . Patient to be transferred to 2C08 via bed.

## 2015-01-29 NOTE — Progress Notes (Signed)
Pt just got to floor from 2 heart. Pt is alert and oriented to his name but not his birthday, not time, oriented to place and situation. Pt states no pain. Pt's vitals within normal limits. Pt is resting. Telemetry and elink notified of pt here. Will monitor.

## 2015-01-29 NOTE — Progress Notes (Addendum)
Cardiology Progress Note  Subjective: No acute events overnight. Timothy Lambert was seen and examined this morning.  He is awake and alert.  He denies CP or dyspnea.   Objective: Vital signs in last 24 hours: Filed Vitals:   01/29/15 0443 01/29/15 0500 01/29/15 0600 01/29/15 0612  BP:  195/97 190/91 177/85  Pulse:  93 95 84  Temp:  98.4 F (36.9 C) 98.1 F (36.7 C) 98.6 F (37 C)  TempSrc:      Resp:      Height:      Weight: 145 lb 8.1 oz (66 kg)     SpO2:  100% 99% 100%   Weight change: -12 lb 2 oz (-5.5 kg)  Intake/Output Summary (Last 24 hours) at 01/29/15 0701 Last data filed at 01/29/15 0600  Gross per 24 hour  Intake 778.15 ml  Output   1025 ml  Net -246.85 ml   General: awake and alert in NAD HEENT: [place]/AT, [place] in place Cardiac: RRR, no rubs, murmus or gallops; R radial artery bandage is c/d/i; 2+ radials/DPs  Pulm: lungs CTA B/L Abd: soft, nontender, nondistended, +BS GU:   Foley bag with 175cc clear yellow urine Ext: 2+ DPs, no pedal edema Neuro: AAO x 3, responding appropriately, able to move extremities on command  Lab Results: Basic Metabolic Panel:  Recent Labs Lab 01/28/15 0428 01/29/15 0430  NA 141 141  K 3.4* 3.1*  CL 101 104  CO2 28 25  GLUCOSE 209* 106*  BUN 47* 42*  CREATININE 1.83* 1.47*  CALCIUM 8.6* 8.5*  MG 1.8 1.7  PHOS 4.4 3.9    CBC:  Recent Labs Lab 01/22/15 2250  01/28/15 0428 01/29/15 0430  WBC 15.3*  < > 10.6* 10.9*  NEUTROABS 11.1*  --   --   --   HGB 14.2  < > 10.4* 10.9*  HCT 39.5  < > 30.0* 31.7*  MCV 86.4  < > 87.7 87.3  PLT 264  < > 228 258  < > = values in this interval not displayed. CBG:  Recent Labs Lab 01/28/15 0808 01/28/15 1337 01/28/15 1652 01/28/15 1953 01/28/15 2311 01/29/15 0425  GLUCAP 168* 167* 83 84 97 104*   Fasting Lipid Panel:  Recent Labs Lab 01/26/15 0349  01/29/15 0430  CHOL 143  --   --   HDL 40*  --   --   LDLCALC 70  --   --   TRIG 167*  < > 192*  CHOLHDL 3.6  --   --    < > = values in this interval not displayed.   Studies/Results: Dg Chest Port 1 View  01/28/2015   CLINICAL DATA:  Respiratory failure.  EXAM: PORTABLE CHEST - 1 VIEW  COMPARISON:  None.  FINDINGS: Endotracheal tube and NG tube in stable position. Right IJ line stable position. Mediastinum and hilar structures are normal. Stable cardiomegaly. Stable bibasilar subsegmental atelectasis and/or infiltrates. No pleural effusion or pneumothorax  IMPRESSION: 1. Lines and tubes in stable position. 2. Stable bibasilar atelectasis and/or infiltrates.   Electronically Signed   By: Timothy Lambert  Register   On: 01/28/2015 07:07   Medications: I have reviewed the patient's current medications. Scheduled Meds: . antiseptic oral rinse  7 mL Mouth Rinse QID  . aspirin  81 mg Oral Daily  . atorvastatin  80 mg Per Tube q1800  . chlorhexidine  15 mL Mouth Rinse BID  . famotidine  20 mg Oral BID  . folic acid  1  mg Per Tube Daily  . insulin aspart  0-15 Units Subcutaneous 6 times per day  . insulin glargine  10 Units Subcutaneous Daily  . levETIRAcetam  500 mg Oral BID  . metoprolol  5 mg Intravenous 4 times per day  . potassium chloride  10 mEq Intravenous Q1 Hr x 4  . thiamine  100 mg Per Tube Daily  . ticagrelor  90 mg Oral BID   Continuous Infusions: . feeding supplement (VITAL AF 1.2 CAL) Stopped (01/28/15 1000)  . heparin 1,500 Units/hr (01/29/15 [id number])  . nitroGLYCERIN    . propofol (DIPRIVAN) infusion Stopped (01/28/15 1000)   PRN Meds:.sodium chloride, acetaminophen, docusate, fentaNYL (SUBLIMAZE) injection, LORazepam, metoprolol, midazolam, ondansetron (ZOFRAN) IV   2D ECHO:  01/24/15 Study Conclusions  - Left ventricle: The cavity size was normal. Wall thickness was increased in a pattern of moderate LVH. Systolic function was moderately to severely reduced. The estimated ejection fraction was in the range of 30% to 35%. Mid to apical anteroseptal akinesis, apical lateral akinesis,  apical inferior akinesis, akinesis of true apex. Anterior hypokinesis. Doppler parameters are consistent with abnormal left ventricular relaxation (grade 1 diastolic dysfunction). - Aortic valve: There was no stenosis. - Mitral valve: There was trivial regurgitation. - Left atrium: The atrium was mildly dilated. - Right ventricle: The cavity size was normal. Systolic function was normal. - Pulmonary arteries: PA peak pressure: 66 mm Hg (S). - Systemic veins: IVC measured 2.5 cm with < 50% respirophasic variation, suggesting RA pressure 15 mmHg. - Pericardium, extracardiac: A trivial pericardial effusion was identified.  Impressions:  - Normal LV size with moderate LV hypertrophy. EF 30-35% with wall motion abnormalities as noted above. Normal RV size and systolic function. No significant valvular abnormalities. Moderate pulmonary hypertension.  Telemetry:  NSR, HR 80s  Assessment/Plan:  STEMI:  Due to 99% LAD stenosis.  S/p PCI with DES to LAD on 01/23/15.  No rubs or murmurs on exam.  2D ECHO revealed reduced systolic function, EF [id number], apical akinesis and anterior hypokinesis.   - continue ASA 81mg , increase Coreg from 3.125mg  BID to 6.25mg  BID, Lipitor 80mg  - start lisinopril 5mg  daily and titrate to 10mg  daily as Cr improves - will need LifeVest; plan to re-evaluate for need for ICD in 40 days - continue ASA and Brilinta x at least 12 months post DES - can likely transfer to telemetry soon; will leave to the discretion of the primary service  Acute decompensated systolic and diastolic HF:  Resolved.  Volume status appears euvolemic.  Now net neg 5.3L. - d/c lasix - strict I&Os and daily weights - ACEI and BB as above - monitor Cr - will need LifeVest prior to d/c; RN will contact the rep for patient education and to facilitate placement  Acute CVA, likely embolic:  MRI reveals multiple small foci of acute ischemia c/w embolic event.  2D ECHO did not  reveal thrombus but this is still a concern given reduced systolic fundtion, apical akinesis.  The patient does not appear to have gross deficits.  Neuro recommendations appreciated. - continue heparin gtt for A/C in the setting of probable cardioembolic CVA; Neuro feels he is not a good long-term coumadin candidate given his EtOH abuse; send out on ASA and Brilinta as above. - risk factor modification - will stop heparin today since patient will not be placed on long-term A/C for above reasons, he is on ASA and Brilinta for DES, and to decrease bleeding risk for NG placement.  Hypertension:  Initial HTN emergency esolved but BPs moderately elevated off propofol gtt. - increase Coreg and start ACEI as above - monitor BP and Cr  VDRF:  Resolved.  Patient successfully extubated 01/28/15.  Stable on 3L [place].   EtOH abuse hx w/ seizure:  No signs of withdrawal.  Keppra per Neuro.     NPO status:  Patient failed bedside swallow evaluation.  His dysphagia is likely 2/2 to being intubated for several days.   Appreciate SLP evaluation.  Will continue NPO for now and SLP to re-evaluate tomorrow.   - RN to place NG tube because he needs to get Brilinta - po medications per NG   LOS: 6 days   Timothy Maxin, DO  01/29/15 7:01AM  Personally seen and examined. Agree with above. Needs DAPT, will place NGT. Failed swallow eval. Appears euvolemic [place] Trying to uptitrate BP meds.   Timothy Furbish, MD

## 2015-01-29 NOTE — Progress Notes (Signed)
Pt had orders to resume NG tube feeding. Ordered and received pump but did not receive jevity 1.2 feeding from pharmacy. Notified pharmacy. Notified night nurse. Pt's wife at bedside. Pt had an incontinent episode of urine after coming to floor from 2 heart.

## 2015-01-29 NOTE — Progress Notes (Signed)
[facility] ADULT ICU REPLACEMENT PROTOCOL FOR AM LAB REPLACEMENT ONLY  The patient does apply for the [facility] Adult ICU Electrolyte Replacment Protocol based on the criteria listed below:   1. Is GFR >/= 40 ml/min? Yes.    Patient's GFR today is 51 2. Is urine output >/= 0.5 ml/kg/hr for the last 6 hours? Yes.   Patient's UOP is 1.4 ml/kg/hr 3. Is BUN < 60 mg/dL? Yes.    Patient's BUN today is 42  4. Abnormal electrolyte(s):K 3.1, Mag 1.7 5. Ordered repletion with: per protocol 6. If a panic level lab has been reported, has the CCM MD in charge been notified? No..   Physician:    Ronda Fairly A 01/29/2015 5:30 AM

## 2015-01-29 NOTE — Progress Notes (Signed)
PULMONARY / CRITICAL CARE MEDICINE   Name: Timothy Lambert MRN: [id number] DOB: 1956-04-27    ADMISSION DATE:  01/22/2015 CONSULTATION DATE:  01/29/2015  REFERRING MD :  EDP  CHIEF COMPLAINT:  Seizures  INITIAL PRESENTATION:  59 y.o. M brought to [facility] ED 5/4 with seizures.  In ED, was altered and extremely agitated requiring intubation for airway protection and further workup .  EKG with STEMI. Taken urgently to cath and PTCI to LAD. Back to ICU on MV   STUDIES:  CXR 5/4 >>> LLL atx vs infiltrate. CT head 5/4 >>> no acute findings, chronic atrophy and small vessel changes. EEG 5/4 >> no seizure focus or active seizures MRI brain 5/4 >> multiple small foci of acute ischemia in cerebrum and cerebellum, suggestive of embolic process.  TTE 5/5 >> ef 30% , moderate lvh. Extensive akinesis.  SIGNIFICANT EVENTS: 5/4 - admitted with seizures, intubated for airway protection and further workup, EKG findings concerning for ischemia.  Transferred to [facility] ICU for further evaluation and management. 5/5 extubated then flash edema and promptly reintubated.  SUBJECTIVE:  Follows commands but not speaking clearly.  VITAL SIGNS: Temp:  [98.1 F (36.7 C)-99.7 F (37.6 C)] 98.8 F (37.1 C) (05/10 0800) Pulse Rate:  [31-95] 92 (05/10 0851) Resp:  [14-21] 17 (05/09 2300) BP: (136-195)/(80-97) 183/94 mmHg (05/10 0851) SpO2:  [98 %-100 %] 100 % (05/10 0851) Weight:  [66 kg (145 lb 8.1 oz)] 66 kg (145 lb 8.1 oz) (05/10 0443) HEMODYNAMICS:   VENTILATOR SETTINGS:   INTAKE / OUTPUT: Intake/Output      05/09 0701 - 05/10 0700 05/10 0701 - 05/11 0700   I.V. (mL/kg) 613.2 (9.3) 25 (0.4)   NG/GT 90    IV Piggyback 150 100   Total Intake(mL/kg) 853.2 (12.9) 125 (1.9)   Urine (mL/kg/hr) 2525 (1.6)    Stool     Total Output 2525     Net -1671.9 +125         PHYSICAL EXAMINATION: General: Chronically ill appearing male, in NAD. Extubated but not speaking. Neuro: Moves all ext to command but not  speaking, nods yes/no to questions however. Head: [place]/AT EENT: PERRL, sclerae anicteric, EOM-I and MMM. Cardiovascular: RRR, no M/R/G.  Lungs: Decreased bs bases, no distress Abdomen: BS x 4, soft, NT/ND.  Musculoskeletal: No gross deformities, no edema.  Skin: Intact, warm, no rashes.  LABS:  CBC  Recent Labs Lab 01/27/15 0453 01/28/15 0428 01/29/15 0430  WBC 10.4 10.6* 10.9*  HGB 10.4* 10.4* 10.9*  HCT 30.0* 30.0* 31.7*  PLT 217 228 258   Coag's No results for input(s): APTT, INR in the last 168 hours. BMET  Recent Labs Lab 01/27/15 0453 01/28/15 0428 01/29/15 0430  NA 139 141 141  K 3.6 3.4* 3.1*  CL 101 101 104  CO2 26 28 25   BUN 38* 47* 42*  CREATININE 1.81* 1.83* 1.47*  GLUCOSE 274* 209* 106*   Electrolytes  Recent Labs Lab 01/27/15 0453 01/28/15 0428 01/29/15 0430  CALCIUM 8.4* 8.6* 8.5*  MG 1.7 1.8 1.7  PHOS 4.0 4.4 3.9   Sepsis Markers  Recent Labs Lab 01/22/15 2319 01/23/15 0530 01/23/15 0758  LATICACIDVEN 11.02* 2.3* 2.9*   ABG  Recent Labs Lab 01/23/15 0045 01/26/15 0355 01/28/15 0445  PHART 7.301* 7.395 7.384  PCO2ART 56.6* 36.5 47.4*  PO2ART 159.0* 139* 151*   Liver Enzymes  Recent Labs Lab 01/22/15 2250 01/23/15 0530  AST 34 274*  ALT 20 44  ALKPHOS 72  50  BILITOT 0.6 0.8  ALBUMIN 4.6 3.4*   Cardiac Enzymes  Recent Labs Lab 01/22/15 2250 01/23/15 0309 01/23/15 0530  TROPONINI 0.08* >65.00* >65.00*   Glucose  Recent Labs Lab 01/28/15 1337 01/28/15 1652 01/28/15 1953 01/28/15 2311 01/29/15 0425 01/29/15 0730  GLUCAP 167* 83 84 97 104* 118*   Imaging Dg Chest Port 1 View  01/28/2015   CLINICAL DATA:  Respiratory failure.  EXAM: PORTABLE CHEST - 1 VIEW  COMPARISON:  None.  FINDINGS: Endotracheal tube and NG tube in stable position. Right IJ line stable position. Mediastinum and hilar structures are normal. Stable cardiomegaly. Stable bibasilar subsegmental atelectasis and/or infiltrates. No pleural  effusion or pneumothorax  IMPRESSION: 1. Lines and tubes in stable position. 2. Stable bibasilar atelectasis and/or infiltrates.   Electronically Signed   By: Marcello Moores  Register   On: 01/28/2015 07:07   I reviewed CXR myself, resolving pulmonary edema noted.  ASSESSMENT / PLAN:  CARDIOVASCULAR A:  Hypertensive emergency - initial SBP 230 on EMS arrival and down to 167 on ED arrival.  HTN could have potentially attributed to his seizure (vs ETOH withdrawal) STEMI - EKG with ST elevation in precordial leads with t wave inversions concerning for anteroseptal infarct S/p PTCI to LAD ? possible endocarditis given concern for neurological embolic event P:  D/C nitro gtt. Heparin gtt, on brilinta after placement of panda tube then will d/c heparin. ASA, metoprolol. Lipitor. Echo as noted.  PULMONARY OETT 5/4 >>> A: VDRF following GTC seizure, acute MI, multiple embolic  stroke P:   Titrate O2 for sat of 88-92%. Swallow evaluation failed. Ambulate. D/C lasix.  RENAL Lab Results  Component Value Date   CREATININE 1.47* 01/29/2015   CREATININE 1.83* 01/28/2015   CREATININE 1.81* 01/27/2015  A:   AGMA - lactate, improved AKI - unknown baseline renal function, progressive 5/5 but with adequate UOP, improving Cr, now in diuretic stage of ATN. Hypokalemia, worse P:   KVO IVF D/C diuresis as ordered. K repletion as needed Follow BMP Watch UOP closely, likely in diuretic stage of ATN at this point.  GASTROINTESTINAL A:   GI prophylaxis Nutrition P:   SUP: Famotidine. Resume TF. Place NGT.  HEMATOLOGIC A:   VTE Prophylaxis P:  Heparin drip, will defer use to cardiology, now on brilinta. Follow CBC intermittently   INFECTIOUS A:   No indication of infection P:   Monitor clinically.  ENDOCRINE CBG (last 3)   Recent Labs  01/28/15 2311 01/29/15 0425 01/29/15 0730  GLUCAP 97 104* 118*   A:   DM - not on outpatient meds P:   SSI change to moderate 5/8 and low  dose lantus now back on tf NEUROLOGIC A:   Acute metabolic encephalopathy(now follows commands) Seizure disorder - no hx of seizures.  Likely related to ETOH withdrawal vs less likely HTN emergency (initial head CT negative for any acute process) Acute scattered small strokes consistent with embolic process ETOH abuse UDS noted positive for benzo's - unsure pt's hx but likely that he received benzos from EMS prior to UDS collection P:   Sedation: D/C all sedation. Keppra scheduled, Ativan PRN. Neurology following. On ASA, no apparent contraindication to brilinta in setting CVA's >[place] note reviewed.  Family updated: Wife and patient updated bedside.  Discussed with RN and TRH MD.  Will transfer to SDU and to [facility] service, PCCM off 5/11.  Rush Farmer, M.D. [facility] Pulmonary/Critical Care Medicine. Pager: [phone]. After hours pager: [phone].  01/29/2015, 11:08 AM

## 2015-01-29 NOTE — Evaluation (Signed)
Physical Therapy Evaluation Patient Details Name: Timothy Lambert MRN: [id number] DOB: 1956/02/10 Today's Date: 01/29/2015   History of Present Illness  59 y.o. M brought to [facility] 5/4 with seizures. In ED, was altered and extremely agitated requiring intubation for airway protection . EKG with STEMI. Taken urgently to cath and PTCI to LAD. Extubated 5/6 with reintubation, extubated 5/9  Clinical Impression  Pt pleasant but very soft spoken. Pt initially with difficulty maintaining eyes open and needed stimulation to maintain engaged. Pt with decreased functional mobility, balance and inability to weight shift in standing without knees buckling at this time. Pt will benefit from acute therapy to maximize mobility, strength, function and balance to decrease burden of care and return pt to PLOF.     Follow Up Recommendations CIR;Supervision/Assistance - 24 hour    Equipment Recommendations  3in1 (PT)    Recommendations for Other Services OT consult;Rehab consult     Precautions / Restrictions Precautions Precautions: Fall      Mobility  Bed Mobility Overal bed mobility: Needs Assistance Bed Mobility: Rolling;Sidelying to Sit Rolling: Mod assist Sidelying to sit: Mod assist       General bed mobility comments: cues for sequence with delayed processing and hand over hand assist to reach for rail. Assist to elevate trunk and bring legs off of bed  Transfers Overall transfer level: Needs assistance   Transfers: Sit to/from Stand;Stand Pivot Transfers Sit to Stand: Min assist Stand pivot transfers: Mod assist       General transfer comment: pt stood well from bed with cues but legs buckling with pivoting to chair with assist to control pelvis with pivot and descent to chair  Ambulation/Gait                Stairs            Wheelchair Mobility    Modified Rankin (Stroke Patients Only)       Balance Overall balance assessment: Needs assistance    Sitting balance-Leahy Scale: Fair       Standing balance-Leahy Scale: Poor                               Pertinent Vitals/Pain Pain Assessment: No/denies pain  HR 94 sats 100% on RA BP 176/93 supine 183/94 sitting    Home Living Family/patient expects to be discharged to:: Private residence Living Arrangements: Spouse/significant other   Type of Home: House Home Access: Stairs to enter   Technical brewer of Steps: 4 Home Layout: Multi-level Home Equipment: Environmental consultant - 2 wheels;Cane - single point      Prior Function Level of Independence: Independent         Comments: pt works full time at a Environmental health practitioner        Extremity/Trunk Assessment   Upper Extremity Assessment: Generalized weakness           Lower Extremity Assessment: RLE deficits/detail;LLE deficits/detail RLE Deficits / Details: 4+/5 hip flexion, knee flexion and extension with myotome testing however not demonstrating that same strength with functional activity with knees buckling with weight shifting LLE Deficits / Details: 4+/5 hip flexion, knee flexion and extension with myotome testing however not demonstrating that same strength with functional activity with knees buckling with weight shifting  Cervical / Trunk Assessment: Normal  Communication   Communication: Expressive difficulties (very soft spoken post extubation)  Cognition Arousal/Alertness: Lethargic Behavior During Therapy:  Flat affect Overall Cognitive Status: Impaired/Different from baseline Area of Impairment: Problem solving             Problem Solving: Slow processing General Comments: pt with accurate orientation but slow processing    General Comments      Exercises General Exercises - Lower Extremity Long Arc Quad: AROM;Seated;Both;10 reps Hip Flexion/Marching: AROM;Seated;Both;10 reps      Assessment/Plan    PT Assessment Patient needs continued PT services  PT  Diagnosis Difficulty walking;Generalized weakness;Altered mental status   PT Problem List Decreased strength;Decreased cognition;Decreased activity tolerance;Decreased balance;Decreased mobility;Decreased knowledge of use of DME  PT Treatment Interventions Gait training;DME instruction;Stair training;Functional mobility training;Therapeutic activities;Therapeutic exercise;Balance training;Patient/family education;Cognitive remediation   PT Goals (Current goals can be found in the Care Plan section) Acute Rehab PT Goals Patient Stated Goal: return to home and work PT Goal Formulation: With patient Time For Goal Achievement: 02/12/15 Potential to Achieve Goals: Good    Frequency Min 3X/week   Barriers to discharge Decreased caregiver support      Co-evaluation               End of Session   Activity Tolerance: Patient tolerated treatment well Patient left: in chair;with call bell/phone within reach;with chair alarm set Nurse Communication: Mobility status         Time: [id number] PT Time Calculation (min) (ACUTE ONLY): 21 min   Charges:   PT Evaluation $Initial PT Evaluation Tier I: 1 Procedure     PT G CodesMelford Aase 01/29/2015, 8:58 AM Elwyn Reach, [place]

## 2015-01-29 NOTE — Progress Notes (Signed)
1015 Read note by PT. Will follow their progress with pt. Graylon Good RN BSN 01/29/2015 10:16 AM

## 2015-01-29 NOTE — Progress Notes (Signed)
*  PRELIMINARY RESULTS* Vascular Ultrasound Carotid Duplex (Doppler) has been completed.  Findings suggest 1-39% internal carotid artery stenosis bilaterally. Vertebral arteries are patent with antegrade flow.  01/29/2015 5:23 PM Maudry Mayhew, RVT, RDCS, RDMS

## 2015-01-29 NOTE — Care Management Note (Signed)
Case Management Note  Patient Details  Name: MIKAL DEGRANDE MRN: [id number] Date of Birth: 06/10/1956  Subjective/Objective:                    Action/Plan: Lives at home with wife, Davaun Najera [phone]  Expected Discharge Date:                  Expected Discharge Plan:  [place]  In-House Referral:     Discharge [facility]  Post Acute Care Choice:    Choice offered to:     DME Arranged:    DME Agency:     HH Arranged:    [place] Agency:     Status of Service:  Completed, signed off  Medicare Important Message Given:  No Date Medicare IM Given:    Medicare IM give by:    Date Additional Medicare IM Given:    Additional Medicare Important Message give by:     If discussed at [place] of Stay Meetings, dates discussed:    Additional Comments: NCM spoke with wife at bedside. States pt does not have any insurance. Provided wife with [facility] brochure. Explained at time of dc NCM will arrange appt for [facility] and they can pick up meds from pharmacy once appt is made. Spoke to [employer] rep. Application has been started for device. Waiting final approval. Waiting final recommendations for home.  Erenest Rasher, RN 01/29/2015, 2:37 PM

## 2015-01-29 NOTE — Progress Notes (Signed)
Nutrition Follow-up  DOCUMENTATION CODES:  Not applicable  INTERVENTION:  Tube feeding  Once NG tube placement confirmed will start Jevity 1.2 @ 70 ml/hr Provides: 2016 kcal, 93 grams protein, and 1360 ml H2O.  NUTRITION DIAGNOSIS:  Inadequate oral intake related to inability to eat as evidenced by NPO status.  ongoing  GOAL:  Patient will meet greater than or equal to 90% of their needs  Not met  MONITOR:  TF tolerance, Diet advancement, Weight trends  REASON FOR ASSESSMENT:  Consult Enteral/tube feeding initiation and management  ASSESSMENT:  Pt admitted with seizures. Pt intubated and extubated but had flash edema and was re-intubated, most recently extubated 5/9. Pt failed swallow evaluation.  NG tube placed, tip in peripyloric region.   Medications include: folic acid, KCl, thiamine, and lantus  Labs reviewed: Potassium low, cbg's: 84-118  Height:  Ht Readings from Last 1 Encounters:  01/22/15 5' 6"  (1.676 m)    Weight:  Wt Readings from Last 1 Encounters:  01/29/15 145 lb 8.1 oz (66 kg)    Ideal Body Weight:  64.5 kg  Wt Readings from Last 10 Encounters:  01/29/15 145 lb 8.1 oz (66 kg)    BMI:  Body mass index is 23.5 kg/(m^2).  Estimated Nutritional Needs:  Kcal:  1900-2100  Protein:  85-100  Fluid:  >/= 1.9 L/day  Skin:  Reviewed, no issues  Diet Order:  Diet NPO time specified  EDUCATION NEEDS:  No education needs identified at this time   Intake/Output Summary (Last 24 hours) at 01/29/15 1142 Last data filed at 01/29/15 0800  Gross per 24 hour  Intake    790 ml  Output   2300 ml  Net  -1510 ml    Last BM:  5/8  [place], [place], [place] Pager [phone] After Hours Pager

## 2015-01-29 NOTE — Progress Notes (Signed)
Rehab Admissions Coordinator Note:  Patient was screened by [name] for appropriateness for an Inpatient Acute Rehab Consult.  At this time, we are recommending Inpatient Rehab consult.  [name] 01/29/2015, 9:57 AM  I can be reached at [phone].

## 2015-01-30 ENCOUNTER — Inpatient Hospital Stay (HOSPITAL_COMMUNITY): Payer: Medicaid Other

## 2015-01-30 DIAGNOSIS — R739 Hyperglycemia, unspecified: Secondary | ICD-10-CM

## 2015-01-30 DIAGNOSIS — R401 Stupor: Secondary | ICD-10-CM

## 2015-01-30 LAB — BASIC METABOLIC PANEL
Anion gap: 15 (ref 5–15)
BUN: 43 mg/dL — AB (ref 6–20)
CALCIUM: 9.8 mg/dL (ref 8.9–10.3)
CO2: 25 mmol/L (ref 22–32)
Chloride: 108 mmol/L (ref 101–111)
Creatinine, Ser: 1.37 mg/dL — ABNORMAL HIGH (ref 0.61–1.24)
GFR, EST NON AFRICAN AMERICAN: 55 mL/min — AB (ref >60)
GLUCOSE: 140 mg/dL — AB (ref 70–99)
POTASSIUM: 4 mmol/L (ref 3.5–5.1)
Sodium: 148 mmol/L — ABNORMAL HIGH (ref 135–145)

## 2015-01-30 LAB — CBC
HCT: 35.3 % — ABNORMAL LOW (ref 39.0–52.0)
HEMOGLOBIN: 12.4 g/dL — AB (ref 13.0–17.0)
MCH: 30.8 pg (ref 26.0–34.0)
MCHC: 35.1 g/dL (ref 30.0–36.0)
MCV: 87.8 fL (ref 78.0–100.0)
PLATELETS: 342 10*3/uL (ref 150–400)
RBC: 4.02 MIL/uL — ABNORMAL LOW (ref 4.22–5.81)
RDW: 13 % (ref 11.5–15.5)
WBC: 12.7 10*3/uL — ABNORMAL HIGH (ref 4.0–10.5)

## 2015-01-30 LAB — MAGNESIUM: MAGNESIUM: 2.5 mg/dL — AB (ref 1.7–2.4)

## 2015-01-30 LAB — HEPARIN LEVEL (UNFRACTIONATED): Heparin Unfractionated: <0.1 IU/mL — ABNORMAL LOW (ref 0.30–0.70)

## 2015-01-30 LAB — GLUCOSE, CAPILLARY
GLUCOSE-CAPILLARY: 125 mg/dL — AB (ref 70–99)
GLUCOSE-CAPILLARY: 150 mg/dL — AB (ref 70–99)
GLUCOSE-CAPILLARY: 252 mg/dL — AB (ref 70–99)
Glucose-Capillary: 149 mg/dL — ABNORMAL HIGH (ref 70–99)
Glucose-Capillary: 155 mg/dL — ABNORMAL HIGH (ref 70–99)
Glucose-Capillary: 170 mg/dL — ABNORMAL HIGH (ref 70–99)
Glucose-Capillary: 201 mg/dL — ABNORMAL HIGH (ref 70–99)

## 2015-01-30 LAB — PHOSPHORUS: Phosphorus: 3.9 mg/dL (ref 2.5–4.6)

## 2015-01-30 MED ORDER — CARVEDILOL 25 MG PO TABS
25.0000 mg | ORAL_TABLET | Freq: Two times a day (BID) | ORAL | Status: DC
  Filled 2015-01-30 (×4): qty 1

## 2015-01-30 MED ORDER — LISINOPRIL 10 MG PO TABS
10.0000 mg | ORAL_TABLET | Freq: Every day | ORAL | Status: DC
  Administered 2015-02-01: 10 mg
  Filled 2015-01-30 (×2): qty 1

## 2015-01-30 MED ORDER — METOPROLOL TARTRATE 1 MG/ML IV SOLN
5.0000 mg | Freq: Four times a day (QID) | INTRAVENOUS | Status: DC
  Administered 2015-01-30 – 2015-01-31 (×3): 5 mg via INTRAVENOUS
  Filled 2015-01-30 (×7): qty 5

## 2015-01-30 MED ORDER — INSULIN GLARGINE 100 UNIT/ML ~~LOC~~ SOLN
14.0000 [IU] | Freq: Every day | SUBCUTANEOUS | Status: DC
  Administered 2015-01-31 – 2015-02-01 (×2): 14 [IU] via SUBCUTANEOUS
  Filled 2015-01-30 (×2): qty 0.14

## 2015-01-30 MED ORDER — SODIUM CHLORIDE 0.9 % IV SOLN
250.0000 mL | INTRAVENOUS | Status: DC | PRN
  Administered 2015-01-30: 250 mL via INTRAVENOUS

## 2015-01-30 MED ORDER — HYDROCHLOROTHIAZIDE 12.5 MG PO CAPS: 12.5000 mg | ORAL_CAPSULE | Freq: Every day | ORAL | Status: DC

## 2015-01-30 MED ORDER — SODIUM CHLORIDE 0.9 % IV SOLN
500.0000 mg | Freq: Two times a day (BID) | INTRAVENOUS | Status: DC
  Administered 2015-01-30 – 2015-01-31 (×2): 500 mg via INTRAVENOUS
  Filled 2015-01-30 (×6): qty 5

## 2015-01-30 MED ORDER — LEVETIRACETAM 100 MG/ML PO SOLN
500.0000 mg | Freq: Two times a day (BID) | ORAL | Status: DC
  Administered 2015-01-31 – 2015-02-01 (×2): 500 mg via ORAL
  Filled 2015-01-30 (×5): qty 5

## 2015-01-30 MED ORDER — LEVETIRACETAM 500 MG/5ML IV SOLN: 500.0000 mg | Freq: Two times a day (BID) | INTRAVENOUS | Status: DC

## 2015-01-30 MED ORDER — HYDRALAZINE HCL 20 MG/ML IJ SOLN
5.0000 mg | Freq: Once | INTRAMUSCULAR | Status: AC
  Administered 2015-01-30: 5 mg via INTRAVENOUS
  Filled 2015-01-30: qty 1

## 2015-01-30 MED ORDER — HYDROCHLOROTHIAZIDE 10 MG/ML ORAL SUSPENSION
12.5000 mg | Freq: Every day | ORAL | Status: DC
  Filled 2015-01-30: qty 1.88

## 2015-01-30 MED ORDER — ACETAMINOPHEN 650 MG RE SUPP: 650.0000 mg | RECTAL | Status: DC | PRN

## 2015-01-30 NOTE — Progress Notes (Signed)
[place] TEAM 1 - Stepdown/ICU TEAM Progress Note  Timothy Lambert [id number] DOB: 1956-02-09 DOA: 01/22/2015 PCP: No primary care provider on file.  Admit HPI / Brief Narrative: 59 y.o. M brought to [facility] ED 5/4 with seizures. In ED he was altered and extremely agitated requiring intubation for airway protection. EKG noted STEMI. Taken urgently to cath and PTCI to LAD. Back to ICU on ventilator.  Significant Events: 5/4 - admitted with seizures, intubated for airway protection and further workup, EKG findings concerning for ischemia. Transferred to [facility] ICU for further evaluation and management. 5/5 extubated > flash edema > promptly reintubated  HPI/Subjective: The patient's eyes are open but he does not follow the examiner nor does he interact in any meaningful way.  He is not able to provide a review of systems due to his encephalopathy.  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  Acute scattered multiple CVAs Pattern suggestive of embolic process - no gross evidence of cardiac thrombus on TTE - not felt to be a candidate for systemic anticoagulation  Dysphagia Remains NPO per speech evaluation - continue tube feeding per nutrition  Anterior STEMI Status post PTCI to 99% mid/distal LAD lesion with multiple other areas of disease appreciated - to have LifeVest arranged by cardiology prior to discharge - Cardiology following  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 via TTE 01/24/15 - Appears euvolemic at present  Newly appreciated Paroxysmal atrial flutter Per cardiology - Rate controlled at time of visit  VDRF following GTC seizure,  acute MI, multiple embolicstrokes Resolved/extubated  Acute kidney injury Baseline creatinine unknown - Creatinine is presently improving daily  Hypokalemia Replaced to normal range  DM Does not appear to be on outpatient medications - adjust treatment as CBG remains elevated  Code Status: FULL Family Communication:Spoke with wife at bedside  Disposition Plan: SDU  Consultants: Cardiology  Neurology  Procedures: 5/10 - carotid Dopplers  - no significant stenosis bilaterally with antegrade vertebral artery flow   Antibiotics: None   DVT prophylaxis: SCDs  Objective: Blood pressure 173/99, pulse 128, temperature 98.3 F (36.8 C), temperature source Axillary, resp. rate 23, height 5\' 6"  (1.676 m), weight 63.912 kg (140 lb 14.4 oz), SpO2 98 %.  Intake/Output Summary (Last 24 hours) at 01/30/15 1134 Last data filed at 01/30/15 0600  Gross per 24 hour  Intake  92.67 ml  Output      0 ml  Net  92.67 ml   Exam: General: No acute respiratory distress - alert but unresponsive  Lungs: Clear to auscultation bilaterally without wheezes or crackles Cardiovascular: Regular rate without murmur gallop or rub  Abdomen: Nontender, nondistended, soft, bowel sounds positive, no rebound, no ascites, no appreciable mass Extremities: No significant cyanosis, clubbing, or edema bilateral lower extremities  Data Reviewed: Basic Metabolic Panel:  Recent Labs Lab 01/26/15 0209 01/27/15 0453 01/28/15 0428 01/29/15 0430 01/30/15 0315  NA 140 139 141 141 148*  K 3.9 3.6 3.4* 3.1* 4.0  CL 107 101 101 104 108  CO2 23 26 28 25 25   GLUCOSE 169* 274* 209* 106* 140*  BUN 28* 38* 47* 42* 43*  CREATININE 1.81* 1.81* 1.83* 1.47* 1.37*  CALCIUM 8.6* 8.4* 8.6* 8.5* 9.8  MG 1.7 1.7 1.8 1.7 2.5*  PHOS 3.9 4.0 4.4 3.9 3.9    CBC:  Recent Labs Lab 01/26/15 0209 01/27/15 0453 01/28/15 0428 01/29/15 0430 01/30/15 0315  WBC 9.4 10.4 10.6* 10.9* 12.7*  HGB 10.9* 10.4* 10.4* 10.9* 12.4*    HCT 31.2* 30.0* 30.0* 31.7* 35.3*  MCV 87.6 88.5 87.7 87.3 87.8  PLT 202 217 228 258 342    Liver Function Tests: No results for input(s): AST, ALT, ALKPHOS, BILITOT, PROT, ALBUMIN in the last 168 hours. No results for input(s): LIPASE, AMYLASE in the last 168 hours. No results for input(s): AMMONIA in the last 168 hours.  CBG:  Recent Labs Lab 01/29/15 1623 01/29/15 2048 01/29/15 2351 01/30/15 0502 01/30/15 0825  GLUCAP 96 103* 125* 149* 201*    Recent Results (from the past 240 hour(s))  MRSA PCR Screening     Status: None   Collection Time: 01/23/15  4:24 AM  Result Value Ref Range Status   MRSA by PCR NEGATIVE NEGATIVE Final    Comment:        The GeneXpert MRSA Assay (FDA approved for NASAL specimens only), is one component of a comprehensive MRSA colonization surveillance program. It is not intended to diagnose MRSA infection nor to guide or monitor treatment for MRSA infections.      Studies:   Recent x-ray studies have been reviewed in detail by the Attending Physician  Scheduled Meds:  Scheduled Meds: . antiseptic oral rinse  7 mL Mouth Rinse QID  . aspirin  81 mg Per Tube Daily  . atorvastatin  80 mg Per Tube q1800  . carvedilol  25 mg Per Tube BID WC  . chlorhexidine  15 mL Mouth Rinse BID  . famotidine  20 mg Per Tube BID  . folic acid  1 mg Per Tube Daily  . insulin aspart  0-15 Units Subcutaneous 6 times per day  . insulin glargine  10 Units Subcutaneous Daily  . levETIRAcetam  500 mg Per Tube BID  . lisinopril  5 mg Per Tube Daily  . thiamine  100 mg Per Tube Daily  . ticagrelor  90 mg Per Tube BID    Time spent on care of this patient: 35 mins   Eily Louvier T , MD   [facility] Office  [phone] Pager - Text Page per Shea Evans as per below:  On-Call/Text Page:      [email or web address]      password [email or web address]  If 7PM-7AM, please contact night-coverage [email or web address] Password [email or web address] 01/30/2015, 11:34 AM   LOS: 7 days

## 2015-01-30 NOTE — Progress Notes (Signed)
Bed alarm going off and nurse entered room. Pt was at edge of bed trying to get up and pulled out his NG tube which was second one placed since yesterday and pt had mitts on to prevent this. Versed was given prior to this event for agitation per MD orders but did not seem to be effective. Wife is usually at bedside but left for lunch.  Called and reported to Dr. Thereasa Solo as pt cannot have anything by mouth per MD orders and speech. Will monitor.

## 2015-01-30 NOTE — Progress Notes (Signed)
Dr. Thereasa Solo called nurse back and stated to leave NG tube out for today to get pt a rest. Will re-evaluate tomorrow. Pt is now resting. Wife at bedside.

## 2015-01-30 NOTE — Progress Notes (Signed)
Patient Name: Timothy Lambert Date of Encounter: 01/30/2015  Primary Cardiologist: new   Principal Problem:   STEMI (ST elevation myocardial infarction) Active Problems:   Hypertensive emergency   Acute MI anterior wall first episode care   Altered mental state   Acute respiratory failure with hypoxia   Acute renal failure syndrome   Acute respiratory failure   Acute embolic stroke   Hyperglycemia   Seizure   Altered mental status    SUBJECTIVE  Denies any CP or SOB. Wife Dora at Bedside. Hand mitten in place, patient follow command, states he wants something to eat.   CURRENT MEDS . antiseptic oral rinse  7 mL Mouth Rinse QID  . aspirin  81 mg Per Tube Daily  . atorvastatin  80 mg Per Tube q1800  . carvedilol  12.5 mg Per Tube BID WC  . chlorhexidine  15 mL Mouth Rinse BID  . famotidine  20 mg Per Tube BID  . folic acid  1 mg Per Tube Daily  . insulin aspart  0-15 Units Subcutaneous 6 times per day  . insulin glargine  10 Units Subcutaneous Daily  . levETIRAcetam  500 mg Per Tube BID  . lisinopril  5 mg Per Tube Daily  . thiamine  100 mg Per Tube Daily  . ticagrelor  90 mg Per Tube BID    OBJECTIVE  Filed Vitals:   01/30/15 0402 01/30/15 0500 01/30/15 0828 01/30/15 0956  BP: 198/99 174/85 166/91 173/99  Pulse:   95 128  Temp:  99.1 F (37.3 C) 98.3 F (36.8 C)   TempSrc:  Oral Axillary   Resp: 26 11 23    Height:      Weight:  140 lb 14.4 oz (63.912 kg)    SpO2:  99% 98%     Intake/Output Summary (Last 24 hours) at 01/30/15 1036 Last data filed at 01/30/15 0600  Gross per 24 hour  Intake  92.67 ml  Output      0 ml  Net  92.67 ml   Filed Weights   01/29/15 0443 01/29/15 1632 01/30/15 0500  Weight: 145 lb 8.1 oz (66 kg) 144 lb 8 oz (65.545 kg) 140 lb 14.4 oz (63.912 kg)    PHYSICAL EXAM  General: Pleasant, NAD. Neuro: Alert and oriented X 3. Moves all extremities spontaneously. Psych: Normal affect. HEENT:  Normal. NG tube in place  Neck:  Supple without bruits or JVD. Lungs:  Resp regular and unlabored, CTA. Heart: RRR no s3, s4, or murmurs. Abdomen: Soft, non-tender, non-distended, BS + x 4.  Extremities: No clubbing, cyanosis or edema. DP/PT/Radials 2+ and equal bilaterally.  Accessory Clinical Findings  CBC  Recent Labs  01/29/15 0430 01/30/15 0315  WBC 10.9* 12.7*  HGB 10.9* 12.4*  HCT 31.7* 35.3*  MCV 87.3 87.8  PLT 258 [id number]   Basic Metabolic Panel  Recent Labs  01/29/15 0430 01/30/15 0315  NA 141 148*  K 3.1* 4.0  CL 104 108  CO2 25 25  GLUCOSE 106* 140*  BUN 42* 43*  CREATININE 1.47* 1.37*  CALCIUM 8.5* 9.8  MG 1.7 2.5*  PHOS 3.9 3.9   Fasting Lipid Panel  Recent Labs  01/29/15 0430  TRIG 192*    TELE Sinus tach with HR 90s, appears to have aflutter with HR 130s    ECG  No new EKG  Echocardiogram 01/24/2015  LV EF: 30% -  35%  ------------------------------------------------------------------- Indications:   MI - acute 410.91.  ------------------------------------------------------------------- History:  PMH:  Altered mental status. Risk factors: Hypertension. Diabetes mellitus.  ------------------------------------------------------------------- Study Conclusions  - Left ventricle: The cavity size was normal. Wall thickness was increased in a pattern of moderate LVH. Systolic function was moderately to severely reduced. The estimated ejection fraction was in the range of 30% to 35%. Mid to apical anteroseptal akinesis, apical lateral akinesis, apical inferior akinesis, akinesis of true apex. Anterior hypokinesis. Doppler parameters are consistent with abnormal left ventricular relaxation (grade 1 diastolic dysfunction). - Aortic valve: There was no stenosis. - Mitral valve: There was trivial regurgitation. - Left atrium: The atrium was mildly dilated. - Right ventricle: The cavity size was normal. Systolic function was normal. - Pulmonary  arteries: PA peak pressure: 66 mm Hg (S). - Systemic veins: IVC measured 2.5 cm with < 50% respirophasic variation, suggesting RA pressure 15 mmHg. - Pericardium, extracardiac: A trivial pericardial effusion was identified.  Impressions:  - Normal LV size with moderate LV hypertrophy. EF 30-35% with wall motion abnormalities as noted above. Normal RV size and systolic function. No significant valvular abnormalities. Moderate pulmonary hypertension.    Radiology/Studies  Ct Head Wo Contrast  01/23/2015   CLINICAL DATA:  Altered mental status  EXAM: CT HEAD WITHOUT CONTRAST  TECHNIQUE: Contiguous axial images were obtained from the base of the skull through the vertex without intravenous contrast.  COMPARISON:  None.  FINDINGS: There is no intracranial hemorrhage, mass or evidence of acute infarction. There is moderate generalized atrophy. There is mild chronic microvascular ischemic change. There is no significant extra-axial fluid collection.  No acute intracranial findings are evident.  IMPRESSION: Moderate chronic atrophy and mild chronic small vessel changes. No acute intracranial findings. Mild motion degradation of the images.   Electronically Signed   By: Andreas Newport M.D.   On: 01/23/2015 00:33   Mr Brain Wo Contrast  01/24/2015   CLINICAL DATA:  Altered mental status, fell out of bed at 8:30 p.m., diaphoretic, witnessed seizure with incontinence. Possible alcohol withdrawal.  EXAM: MRI HEAD WITHOUT CONTRAST  TECHNIQUE: Multiplanar, multiecho pulse sequences of the brain and surrounding structures were obtained without intravenous contrast.  COMPARISON:  CT of the head Jan 23, 2015  FINDINGS: Multiple small foci of reduced diffusion and bifrontal lobes, bilateral occipital lobes, RIGHT parietal lobe and, RIGHT cerebellum, measuring up to 12 mm and RIGHT occipital lobe. Corresponding low ADC values. No susceptibility artifact to suggest hemorrhage. Ventricles and sulci are upper  limits of normal in size for patient's age. Patchy white matter T2 hyperintensities noted, exclusive of the aforementioned abnormality.  No abnormal extra-axial fluid collections. Major intracranial vascular flow voids seen at the skull base. Imaged ocular globes and orbital contents are unremarkable. Mild paranasal sinus mucosal thickening without air-fluid levels. Mild bilateral mastoid effusions. No abnormal sellar expansion. No cerebellar tonsillar ectopia. No abnormal calvarial bone marrow signal. Patient is edentulous.  IMPRESSION: Multiple small foci of acute ischemia spanning multiple vascular territories (involving the cerebrum and cerebellum) most consistent with embolic event.  Parenchymal brain volume loss, upper limits of normal for age. Minimal white matter changes suggest chronic small vessel ischemic disease.   Electronically Signed   By: Elon Alas   On: 01/24/2015 03:51   Dg Chest Port 1 View  01/28/2015   CLINICAL DATA:  Respiratory failure.  EXAM: PORTABLE CHEST - 1 VIEW  COMPARISON:  None.  FINDINGS: Endotracheal tube and NG tube in stable position. Right IJ line stable position. Mediastinum and hilar structures are normal. Stable cardiomegaly. Stable  bibasilar subsegmental atelectasis and/or infiltrates. No pleural effusion or pneumothorax  IMPRESSION: 1. Lines and tubes in stable position. 2. Stable bibasilar atelectasis and/or infiltrates.   Electronically Signed   By: [place]   On: 01/28/2015 07:07   Dg Chest Port 1 View  01/27/2015   CLINICAL DATA:  Respiratory failure. Intubated patient. History of coronary artery disease, hypertension and diabetes.  EXAM: PORTABLE CHEST - 1 VIEW  COMPARISON:  01/26/2015  FINDINGS: Endotracheal tube tip projects 2 cm above the carina. Nasogastric tube and right internal jugular central venous line are stable and well positioned. Mild hazy opacity in the medial left lung base appears mildly improved. This could reflect pneumonia or  atelectasis. Remainder of the lungs is clear. No pneumothorax.  IMPRESSION: 1. Mild improvement in left lung base opacity which may reflect improved pneumonia or improved atelectasis. No new lung abnormalities. 2. Support apparatus is well positioned.   Electronically Signed   By: Lajean Manes M.D.   On: 01/27/2015 07:45   Dg Chest Port 1 View  01/26/2015   CLINICAL DATA:  Acute respiratory failure  EXAM: PORTABLE CHEST - 1 VIEW  COMPARISON:  01/25/2015  FINDINGS: The endotracheal tube tip is 3.7 cm above the carina. The nasogastric tube extends into the stomach. Mild airspace opacity persists in the left base without significant interval change. There is no pneumothorax.  IMPRESSION: Support equipment appears satisfactorily positioned.  No significant interval change in the bilateral airspace opacities.   Electronically Signed   By: Andreas Newport M.D.   On: 01/26/2015 06:07   Dg Chest Port 1 View  01/25/2015   CLINICAL DATA:  Respiratory failure  EXAM: PORTABLE CHEST - 1 VIEW  COMPARISON:  01/24/2015  FINDINGS: The endotracheal tube tip is 3.4 cm above the carina. The right jugular central line extends into the SVC just below the azygos vein junction. The nasogastric tube extends into the stomach. There is improvement, with partial clearance of central and basilar airspace opacities. Mild consolidation persists in the left base. There is no pneumothorax.  IMPRESSION: Support equipment appears satisfactorily positioned.  Improved, with partial clearance of central and basilar airspace opacities. This probably represents resolving pulmonary edema.   Electronically Signed   By: Andreas Newport M.D.   On: 01/25/2015 06:12   Dg Chest Port 1 View  01/24/2015   CLINICAL DATA:  Endotracheal tube placement  EXAM: PORTABLE CHEST - 1 VIEW  COMPARISON:  01/23/2015  FINDINGS: Cardiac shadow is mildly enlarged but stable. A nasogastric catheter is been removed in the interval. A right central venous line is seen in  the mid superior vena cava. Endotracheal tube is noted 2.4 cm above the carina. Diffuse increased density noted throughout both lungs similar to that seen on the prior exam consistent with a degree of pulmonary edema.  IMPRESSION: Stable pulmonary edema.  Tubes and lines as described.   Electronically Signed   By: Inez Catalina M.D.   On: 01/24/2015 15:17   Dg Chest Port 1 View  01/23/2015   CLINICAL DATA:  Central line placement and gastric tube placement  EXAM: PORTABLE CHEST - 1 VIEW  COMPARISON:  None.  FINDINGS: The endotracheal tube tip is 3.5 cm above the carina. The gastric tube reaches the stomach but the side port is at or above the EG junction. The tube should be advanced for optimal placement. The right jugular central line extends into the SVC.  There is no pneumothorax. There is mild central ground-glass opacity  without dense focal airspace consolidation. There is no large effusion.  IMPRESSION: Satisfactory ET tube position. Gastric tube reaches the stomach but should be advanced for optimal placement. Central line is satisfactorily positioned. No pneumothorax.   Electronically Signed   By: Andreas Newport M.D.   On: 01/23/2015 05:31   Dg Chest Port 1 View  01/23/2015   CLINICAL DATA:  Seizure  EXAM: PORTABLE CHEST - 1 VIEW  COMPARISON:  None.  FINDINGS: Endotracheal tube 2.8 cm from carina. Normal cardiac silhouette. There is a left lower lobe opacity posterior to the heart. No pleural fluid. No pneumothorax. No overt pulmonary edema.  IMPRESSION: 1. Endotracheal tube in good position. 2. Left lower lobe atelectasis versus infiltrate.   Electronically Signed   By: Suzy Bouchard M.D.   On: 01/23/2015 00:02   Dg Abd Portable 1v  01/30/2015   CLINICAL DATA:  Nasogastric tube placement  EXAM: PORTABLE ABDOMEN - 1 VIEW  COMPARISON:  01/29/2015  FINDINGS: The enteric tube extends into the stomach with tip over the region of the proximal antrum.  IMPRESSION: Enteric tube extends into the stomach  with tip in the proximal antrum   Electronically Signed   By: Andreas Newport M.D.   On: 01/30/2015 02:20   Dg Abd Portable 1v  01/29/2015   CLINICAL DATA:  Evaluate feeding tube placement.  EXAM: PORTABLE ABDOMEN - 1 VIEW  COMPARISON:  01/24/2015  FINDINGS: Small bore feeding tube is noted with tip in the peripyloric region.  The bowel gas pattern is unremarkable.  IMPRESSION: Feeding tube tip overlying the peripyloric region.   Electronically Signed   By: Margarette Canada M.D.   On: 01/29/2015 13:19   Dg Abd Portable 1v  01/24/2015   CLINICAL DATA:  NG tube placement  EXAM: PORTABLE ABDOMEN - 1 VIEW  COMPARISON:  Prior film same day  FINDINGS: There is NG tube with tip in left upper quadrant medially probable within proximal stomach. The tip of the NG tube is coilled.  IMPRESSION: NG tube with tip coiled in left upper quadrant medially probable within proximal stomach.   Electronically Signed   By: Lahoma Crocker M.D.   On: 01/24/2015 21:25   Dg Abd Portable 1v  01/24/2015   CLINICAL DATA:  Orogastric tube placement  EXAM: PORTABLE ABDOMEN - 1 VIEW  COMPARISON:  01/24/2015 3:08 p.m.  FINDINGS: Endotracheal tube and right neck catheter are in stable position. There is a new orogastric tube which is coiled in the distal esophagus with tip still at the level of the lower esophagus.  The upper abdominal bowel gas pattern is nonobstructive. Haziness of the lower chest, with further evaluation limited by extensive respiratory motion.  These results will be called to the ordering clinician or representative by the Radiologist Assistant, and communication documented in the PACS or zVision Dashboard.  IMPRESSION: The new orogastric tube is coiled in the distal esophagus. Recommend repositioning.   Electronically Signed   By: Monte Fantasia M.D.   On: 01/24/2015 19:19    ASSESSMENT AND PLAN  59 yo male brought to [facility] ED 5/4 with seizure. Noted to have AMS and extremely agitated requiring intubation. EKG showed anterior  ST elevation with trop >65. Pt taken for emergent cath. Cath 5/4 99% mid/distal LAD treated with DES, 50% L main dx, 90% D1 stenosis, 75% diffuse ramus, 50% OM1, 70% mid RCA stenosis, 40% RPDA. EEG obtained on 5/4 abnormal with mild to moderate cerebral disturbance but no seizure. MRI of brain  with multiple small foci of acute ischemia c/w embolic event. Echo 5/5 EF 30-35%.  1. Anterior STEMI  - Cath 5/4 99% mid/distal LAD treated with DES, 50% L main dx, 90% D1 stenosis, 75% diffuse ramus, 50% OM1, 70% mid RCA stenosis, 40% RPDA  - echo 01/24/2015, will need Life[facility]est on discharge, will contact [employer] rep today. Repeat echo in 3 month, if low need ICD  - no LV thrombus noted on 2D echo, continue ASA, Brilinta. Increase coreg to 25mg  BID given SBP 150-190s  2. Acute decompensated systolic and diastolic HF  - Echo [id number] EF 30-35%, PA peak pressure 66mg , significant wall motion abnormalities.  - no acute sign of HF, however likely will need 20-40mg  daily of PO lasix on discharge.  3. Acute CVA, likely embolic  - no LV thrombus noted on 2D echo, continue ASA, Brilinta. Not good anticoagulation candidate given his EtOH abuse  4. Hypertensive emergency.   5. NPO status. Failed swallow eval   - pending repeat sleep eval today  6. ETOH abuse with h/o seizure  7. Proxysmal atrial flutter:  - CHA2-DS2-VAsc score 4 (HTN, HF, stroke)  - MD to review telemetry, appears to have went into aflutter this morning with HR 130s, however resolved. This is new for him  - not candidate for systemic anticoagulation  Signed, Almyra Deforest PA-C Pager: [phone]  Personally seen and examined. Agree with above. Not very verbal Panda in place Wife in room.  [place] on. LifeVest referral. ? If he could be compliant at this point given his current status. Brief AFlutter - not anticoag candidate Agree with increase Coreg Appears euvolemic  SKAINS, MARK, MD

## 2015-01-30 NOTE — Progress Notes (Signed)
Speech Language Pathology Treatment: Dysphagia  Patient Details Name: Timothy Lambert MRN: [id number] DOB: [birth year] Today's Date: 01/30/2015 Time: [id number] SLP Time Calculation (min) (ACUTE ONLY): 15 min  Assessment / Plan / Recommendation Clinical Impression  F/u after yesterday's swallow evaluation. NG has been placed.  Pt continues to present with persisting s/s of dysphagia with poor toleration of all trialed consistencies - likely reduced hyolaryngeal elevation, weak phonation/cough indicating reduced glottal closure, wet cough after 100% of boluses, regardless of consistency.  High aspiration risk at this time.  Recommend continued NPO; SLP will continue f/u to determine readiness for instrumental study.  Educated wife re: issues pertaining to impaired swallow s/p intubation - she verbalizes understanding.     HPI Other Pertinent Information: 59 y.o. M brought to [facility] ED 5/4 with seizures. In ED, was altered and extremely agitated requiring intubation for airway protection . EKG with STEMI. Taken urgently to cath and PTCI to LAD. Extubated 5/6 with reintubation, extubated 5/9   Pertinent Vitals Pain Assessment: No/denies pain  SLP Plan  Continue with current plan of care    Recommendations Diet recommendations: NPO              Oral Care Recommendations: Oral care QID Plan: Continue with current plan of care   Timothy Lambert L. Tivis Ringer, [place] CCC/SLP Pager [phone]      Timothy Lambert Laurice 01/30/2015, 11:25 AM

## 2015-01-30 NOTE — Consult Note (Signed)
Physical Medicine and Rehabilitation Consult Reason for Consult: Seizure /STEMI Referring Physician: [facility]   [name] is a 59 y.o. right handed male with history of hypertension, alcohol abuse, diabetes mellitus and coronary artery disease maintained on aspirin. Patient lives with his wife and independent up until a few weeks ago. Presented to [facility] 01/23/2015 with seizures. Extremely agitated requiring intubation for airway protection. Alcohol level and drug screen negative. CT of the head negative for acute changes. EKG with precordial ST changes concerning for ischemia/infarct. Transferred to [facility] for [facility] care. After arriving to [facility] repeat EKG with worsened ST elevations and T-wave inversions troponin greater than 65. Also noted blood pressure in the high 200s over 100s and glucose in the 400s. Underwent emergent cardiac catheterization findings of 99% LAD stenosis with stenting. Echocardiogram with ejection fraction [id number] grade 1 diastolic dysfunction. MRI 01/24/2015 showing multiple small foci of acute ischemia spanning multiple vascular territories consistent with embolic event. EEG was no seizure activity noted. Generalized low voltage slowing indicating mild to moderate cerebral disturbance encephalopathy. Carotid Dopplers with no ICA stenosis. Neurology follow-up currently maintained on aspirin therapy as well as Brilinta. Currently nothing by mouth with Jevity tube feeds. Physical therapy evaluation completed 01/29/2015 with recommendations of physical medicine rehabilitation consult.  Review of Systems  Unable to perform ROS: mental acuity   Past Medical History  Diagnosis Date  . Coronary artery disease   . Hypertension   . Diabetes mellitus without complication    Past Surgical History  Procedure Laterality Date  . Cardiac catheterization N/A 01/23/2015    Procedure: Left Heart Cath and Coronary Angiography;  Surgeon: [name],  MD;  Location: [facility] INVASIVE CV LAB CUPID;  Service: Cardiovascular;  Laterality: N/A;  . Cardiac catheterization N/A 01/23/2015    Procedure: Coronary Stent Intervention;  Surgeon: [name], MD;  Location: [facility] INVASIVE CV LAB CUPID;  Service: Cardiovascular;  Laterality: N/A;   History reviewed. No pertinent family history. Social History:  reports that he has quit smoking. His smoking use included Cigarettes. He has never used smokeless tobacco. He reports that he drinks about 16.8 oz of alcohol per week. He reports that he does not use illicit drugs. Allergies: No Known Allergies Medications Prior to Admission  Medication Sig Dispense Refill  . aspirin 325 MG tablet Take 325 mg by mouth every 6 (six) hours as needed for mild pain or moderate pain.      Home: Home Living Family/patient expects to be discharged to:: Private residence Living Arrangements: Spouse/significant other Type of Home: House Home Access: Stairs to enter Technical brewer of Steps: 4 Home Layout: Multi-level Alternate Level Stairs-Number of Steps: 7 Home Equipment: Environmental consultant - 2 wheels, [place] - single point  Functional History: Prior Function Level of Independence: Independent Comments: pt works full time at a [employer], applications Status:  Mobility: Bed Mobility Overal bed mobility: Needs Assistance Bed Mobility: Rolling, Sidelying to Sit Rolling: Mod assist Sidelying to sit: Mod assist General bed mobility comments: cues for sequence with delayed processing and hand over hand assist to reach for rail. Assist to elevate trunk and bring legs off of bed Transfers Overall transfer level: Needs assistance Transfers: Sit to/from Stand, Stand Pivot Transfers Sit to Stand: Min assist Stand pivot transfers: Mod assist General transfer comment: pt stood well from bed with cues but legs buckling with pivoting to chair with assist to control pelvis with pivot and descent to chair  ADL:     Cognition: Cognition Overall Cognitive Status: Impaired/Different from baseline Orientation Level: Oriented to person, Oriented to situation, Disoriented to time Cognition Arousal/Alertness: Lethargic Behavior During Therapy: Flat affect Overall Cognitive Status: Impaired/Different from baseline Area of Impairment: Problem solving Problem Solving: Slow processing General Comments: pt with accurate orientation but slow processing  Blood pressure 173/99, pulse 128, temperature 98.3 F (36.8 C), temperature source Axillary, resp. rate 23, height 5\' 6"  (1.676 m), weight 63.912 kg (140 lb 14.4 oz), SpO2 98 %. Physical Exam  HENT:  Head: Normocephalic.  Nasogastric tube in place  Eyes:  Pupil sluggish to light without nystagmus  Neck: Normal range of motion. Neck supple. No thyromegaly present.  Dysphonic voice  Cardiovascular: Normal rate and regular rhythm.   Respiratory: Effort normal and breath sounds normal. No respiratory distress.  GI: Soft. Bowel sounds are normal. He exhibits no distension.  Neurological:  Patient is alert, restless. Needs to be redirected at times. Wife is at bedside. Bilateral mittens in place. He was able to provide his name and age as well as place. Limited awareness of his deficits. Followed simple commands. Moves all 4's with at least 4/5 strength proximal to distal in upper/lower limbs.  Skin: Skin is warm and dry.  Psychiatric:  restless    Results for orders placed or performed during the hospital encounter of 01/22/15 (from the past 24 hour(s))  Glucose, capillary     Status: None   Collection Time: 01/29/15  4:23 PM  Result Value Ref Range   Glucose-Capillary 96 70 - 99 mg/dL  Glucose, capillary     Status: Abnormal   Collection Time: 01/29/15  8:48 PM  Result Value Ref Range   Glucose-Capillary 103 (H) 70 - 99 mg/dL  Glucose, capillary     Status: Abnormal   Collection Time: 01/29/15 11:51 PM  Result Value Ref Range   Glucose-Capillary  125 (H) 70 - 99 mg/dL  Heparin level (unfractionated)     Status: Abnormal   Collection Time: 01/30/15  3:15 AM  Result Value Ref Range   Heparin Unfractionated <0.10 (L) 0.30 - 0.70 IU/mL  Magnesium in AM     Status: Abnormal   Collection Time: 01/30/15  3:15 AM  Result Value Ref Range   Magnesium 2.5 (H) 1.7 - 2.4 mg/dL  CBC     Status: Abnormal   Collection Time: 01/30/15  3:15 AM  Result Value Ref Range   WBC 12.7 (H) 4.0 - 10.5 K/uL   RBC 4.02 (L) 4.22 - 5.81 MIL/uL   Hemoglobin 12.4 (L) 13.0 - 17.0 g/dL   HCT 35.3 (L) 39.0 - 52.0 %   MCV 87.8 78.0 - 100.0 fL   MCH 30.8 26.0 - 34.0 pg   MCHC 35.1 30.0 - 36.0 g/dL   RDW 13.0 11.5 - 15.5 %   Platelets 342 150 - 400 K/uL  Basic metabolic panel     Status: Abnormal   Collection Time: 01/30/15  3:15 AM  Result Value Ref Range   Sodium 148 (H) 135 - 145 mmol/L   Potassium 4.0 3.5 - 5.1 mmol/L   Chloride 108 101 - 111 mmol/L   CO2 25 22 - 32 mmol/L   Glucose, Bld 140 (H) 70 - 99 mg/dL   BUN 43 (H) 6 - 20 mg/dL   Creatinine, Ser 1.37 (H) 0.61 - 1.24 mg/dL   Calcium 9.8 8.9 - 10.3 mg/dL   GFR calc non Af Amer 55 (L) >60 mL/min  GFR calc Af Amer >60 >60 mL/min   Anion gap 15 5 - 15  Phosphorus     Status: None   Collection Time: 01/30/15  3:15 AM  Result Value Ref Range   Phosphorus 3.9 2.5 - 4.6 mg/dL  Glucose, capillary     Status: Abnormal   Collection Time: 01/30/15  5:02 AM  Result Value Ref Range   Glucose-Capillary 149 (H) 70 - 99 mg/dL  Glucose, capillary     Status: Abnormal   Collection Time: 01/30/15  8:25 AM  Result Value Ref Range   Glucose-Capillary 201 (H) 70 - 99 mg/dL   Dg Abd Portable 1v  01/30/2015   CLINICAL DATA:  Nasogastric tube placement  EXAM: PORTABLE ABDOMEN - 1 VIEW  COMPARISON:  01/29/2015  FINDINGS: The enteric tube extends into the stomach with tip over the region of the proximal antrum.  IMPRESSION: Enteric tube extends into the stomach with tip in the proximal antrum   Electronically  Signed   By: Andreas Newport M.D.   On: 01/30/2015 02:20   Dg Abd Portable 1v  01/29/2015   CLINICAL DATA:  Evaluate feeding tube placement.  EXAM: PORTABLE ABDOMEN - 1 VIEW  COMPARISON:  01/24/2015  FINDINGS: Small bore feeding tube is noted with tip in the peripyloric region.  The bowel gas pattern is unremarkable.  IMPRESSION: Feeding tube tip overlying the peripyloric region.   Electronically Signed   By: Margarette Canada M.D.   On: 01/29/2015 13:19    Assessment/Plan: Diagnosis: Embolic CVA 1. Does the need for close, 24 hr/day medical supervision in concert with the patient's rehab needs make it unreasonable for this patient to be served in a less intensive setting? Yes 2. Co-Morbidities requiring supervision/potential complications: htn, hx MI 3. Due to bladder management, bowel management, safety, skin/wound care, disease management, medication administration, pain management and patient education, does the patient require 24 hr/day rehab nursing? Yes 4. Does the patient require coordinated care of a physician, rehab nurse, PT (1-2 hrs/day, 5 days/week), OT (1-2 hrs/day, 5 days/week) and SLP (1-2 hrs/day, 5 days/week) to address physical and functional deficits in the context of the above medical diagnosis(es)? Yes Addressing deficits in the following areas: balance, endurance, locomotion, strength, transferring, bowel/bladder control, bathing, dressing, feeding, grooming, toileting, cognition, speech, swallowing and psychosocial support 5. Can the patient actively participate in an intensive therapy program of at least 3 hrs of therapy per day at least 5 days per week? Yes 6. The potential for patient to make measurable gains while on inpatient rehab is excellent 7. Anticipated functional outcomes upon discharge from inpatient rehab are supervision  with PT, supervision with OT, supervision with SLP. 8. Estimated rehab length of stay to reach the above functional goals is: 10-15 days 9. Does  the patient have adequate social supports and living environment to accommodate these discharge functional goals? Yes 10. Anticipated D/C setting: Home 11. Anticipated post D/C treatments: HH therapy and Outpatient therapy 12. Overall Rehab/Functional Prognosis: excellent  RECOMMENDATIONS: This patient's condition is appropriate for continued rehabilitative care in the following setting: CIR Patient has agreed to participate in recommended program. Potentially Note that insurance prior authorization may be required for reimbursement for recommended care.  Comment: Rehab Admissions Coordinator to follow up.  Thanks,  Meredith Staggers, MD, Mellody Drown     01/30/2015

## 2015-01-31 ENCOUNTER — Inpatient Hospital Stay (HOSPITAL_COMMUNITY): Payer: Medicaid Other

## 2015-01-31 DIAGNOSIS — I214 Non-ST elevation (NSTEMI) myocardial infarction: Secondary | ICD-10-CM | POA: Diagnosis present

## 2015-01-31 DIAGNOSIS — I4892 Unspecified atrial flutter: Secondary | ICD-10-CM | POA: Diagnosis present

## 2015-01-31 DIAGNOSIS — R1310 Dysphagia, unspecified: Secondary | ICD-10-CM | POA: Diagnosis present

## 2015-01-31 DIAGNOSIS — I5040 Unspecified combined systolic (congestive) and diastolic (congestive) heart failure: Secondary | ICD-10-CM | POA: Diagnosis present

## 2015-01-31 DIAGNOSIS — E785 Hyperlipidemia, unspecified: Secondary | ICD-10-CM | POA: Diagnosis present

## 2015-01-31 DIAGNOSIS — R410 Disorientation, unspecified: Secondary | ICD-10-CM

## 2015-01-31 DIAGNOSIS — I2102 ST elevation (STEMI) myocardial infarction involving left anterior descending coronary artery: Secondary | ICD-10-CM | POA: Diagnosis present

## 2015-01-31 DIAGNOSIS — I639 Cerebral infarction, unspecified: Secondary | ICD-10-CM

## 2015-01-31 DIAGNOSIS — E1165 Type 2 diabetes mellitus with hyperglycemia: Secondary | ICD-10-CM

## 2015-01-31 DIAGNOSIS — G9341 Metabolic encephalopathy: Secondary | ICD-10-CM | POA: Diagnosis present

## 2015-01-31 DIAGNOSIS — E876 Hypokalemia: Secondary | ICD-10-CM

## 2015-01-31 LAB — CBC
HEMATOCRIT: 38.4 % — AB (ref 39.0–52.0)
Hemoglobin: 13.2 g/dL (ref 13.0–17.0)
MCH: 30.3 pg (ref 26.0–34.0)
MCHC: 34.4 g/dL (ref 30.0–36.0)
MCV: 88.1 fL (ref 78.0–100.0)
PLATELETS: 384 10*3/uL (ref 150–400)
RBC: 4.36 MIL/uL (ref 4.22–5.81)
RDW: 12.9 % (ref 11.5–15.5)
WBC: 11.6 10*3/uL — ABNORMAL HIGH (ref 4.0–10.5)

## 2015-01-31 LAB — COMPREHENSIVE METABOLIC PANEL
ALT: 36 U/L (ref 17–63)
AST: 34 U/L (ref 15–41)
Albumin: 2.9 g/dL — ABNORMAL LOW (ref 3.5–5.0)
Alkaline Phosphatase: 71 U/L (ref 38–126)
Anion gap: 13 (ref 5–15)
BILIRUBIN TOTAL: 0.5 mg/dL (ref 0.3–1.2)
BUN: 43 mg/dL — AB (ref 6–20)
CHLORIDE: 110 mmol/L (ref 101–111)
CO2: 25 mmol/L (ref 22–32)
CREATININE: 1.48 mg/dL — AB (ref 0.61–1.24)
Calcium: 9.7 mg/dL (ref 8.9–10.3)
GFR, EST AFRICAN AMERICAN: 58 mL/min — AB (ref >60)
GFR, EST NON AFRICAN AMERICAN: 50 mL/min — AB (ref >60)
GLUCOSE: 105 mg/dL — AB (ref 65–99)
Potassium: 3.4 mmol/L — ABNORMAL LOW (ref 3.5–5.1)
Sodium: 148 mmol/L — ABNORMAL HIGH (ref 135–145)
Total Protein: 8.2 g/dL — ABNORMAL HIGH (ref 6.5–8.1)

## 2015-01-31 LAB — VITAMIN B12: VITAMIN B 12: 1126 pg/mL — AB (ref 180–914)

## 2015-01-31 LAB — RETICULOCYTES
RBC.: 4.27 MIL/uL (ref 4.22–5.81)
Retic Count, Absolute: 42.7 10*3/uL (ref 19.0–186.0)
Retic Ct Pct: 1 % (ref 0.4–3.1)

## 2015-01-31 LAB — FERRITIN: Ferritin: 1254 ng/mL — ABNORMAL HIGH (ref 24–336)

## 2015-01-31 LAB — IRON AND TIBC
Iron: 38 ug/dL — ABNORMAL LOW (ref 45–182)
SATURATION RATIOS: 17 % — AB (ref 17.9–39.5)
TIBC: 224 ug/dL — ABNORMAL LOW (ref 250–450)
UIBC: 186 ug/dL

## 2015-01-31 LAB — GLUCOSE, CAPILLARY
GLUCOSE-CAPILLARY: 108 mg/dL — AB (ref 65–99)
GLUCOSE-CAPILLARY: 195 mg/dL — AB (ref 65–99)
GLUCOSE-CAPILLARY: 320 mg/dL — AB (ref 65–99)
Glucose-Capillary: 137 mg/dL — ABNORMAL HIGH (ref 65–99)
Glucose-Capillary: 197 mg/dL — ABNORMAL HIGH (ref 65–99)

## 2015-01-31 LAB — FOLATE: FOLATE: 22.2 ng/mL (ref >5.9)

## 2015-01-31 MED ORDER — RESOURCE THICKENUP CLEAR PO POWD
ORAL | Status: DC | PRN
  Filled 2015-01-31: qty 125

## 2015-01-31 MED ORDER — METOPROLOL TARTRATE 1 MG/ML IV SOLN
5.0000 mg | Freq: Four times a day (QID) | INTRAVENOUS | Status: AC
  Administered 2015-01-31: 5 mg via INTRAVENOUS

## 2015-01-31 MED ORDER — CARVEDILOL 25 MG PO TABS
25.0000 mg | ORAL_TABLET | Freq: Two times a day (BID) | ORAL | Status: DC
  Administered 2015-01-31 – 2015-02-01 (×3): 25 mg via ORAL
  Filled 2015-01-31 (×4): qty 1

## 2015-01-31 MED ORDER — METOPROLOL TARTRATE 1 MG/ML IV SOLN
5.0000 mg | Freq: Four times a day (QID) | INTRAVENOUS | Status: DC
Start: 2015-01-31 — End: 2015-01-31
  Administered 2015-01-31: 5 mg via INTRAVENOUS

## 2015-01-31 NOTE — Progress Notes (Signed)
SPEECH PATHOLOGY  MBS completed - see imaging for results.  REC:   dysphagia 3, nectar-thick liquids  meds crushed in puree  Full supervision due to impulsivity  Seira Cody L. Tivis Ringer, [place] CCC/SLP Pager [phone]

## 2015-01-31 NOTE — Progress Notes (Signed)
PT Cancellation Note  Patient Details Name: Timothy Lambert MRN: [id number] DOB: 1956-07-23   Cancelled Treatment:    Reason Eval/Treat Not Completed: Patient at procedure or test/unavailable. Pt off unit for MBS at this time. Will check back as schedule allows.   Rolinda Roan 01/31/2015, 10:25 AM   Rolinda Roan, PT, DPT Acute Rehabilitation Services Pager: [phone]

## 2015-01-31 NOTE — Progress Notes (Signed)
Rehab admissions - Evaluated for possible admission.  I met with patient and his brother.  I called his wife at her home.  Wife would like inpatient rehab admission.  Wife works here at [employer] in housekeeping.  Patient has no insurance.  I spoke with cardiology who feels patient not ready until tomorrow due to further need for medication adjustments.  I will follow up again in am for medical readiness.  Call me for questions.  #[phone]

## 2015-01-31 NOTE — Progress Notes (Signed)
Speech Language Pathology Treatment: Dysphagia  Patient Details Name: Timothy Lambert MRN: [id number] DOB: [birth year] Today's Date: 01/31/2015 Time: [id number] SLP Time Calculation (min) (ACUTE ONLY): 10 min  Assessment / Plan / Recommendation Clinical Impression  Pt pulled NG again last night; remains encephalopathic and confused, but is more alert and participatory today, improved orientation.  Pt with improved phonation. Continues to demonstrate s/s of dysphagia, but to a lesser degree today (less coughing s/p swallows of purees and thins).  Pt is ready for instrumental swallow study - plan for MBS this a.m.   D/W wife and Therapist, sports.    HPI Other Pertinent Information: 59 y.o. M brought to [facility] ED 5/4 with seizures. In ED, was altered and extremely agitated requiring intubation for airway protection . EKG with STEMI. Taken urgently to cath and PTCI to LAD. Extubated 5/6 with reintubation, extubated 5/9   Pertinent Vitals Pain Assessment: No/denies pain  SLP Plan       Recommendations     Proceed with MBS          Oral Care Recommendations: Oral care QID Follow up Recommendations: Inpatient Rehab      Timothy Lambert, [place] CCC/SLP Pager [phone]  Timothy Lambert 01/31/2015, 9:36 AM

## 2015-01-31 NOTE — Progress Notes (Signed)
TEAM 1 - Stepdown/ICU TEAM Progress Note  Timothy Lambert [id number] DOB: [birth year] DOA: 01/22/2015 PCP: No primary care provider on file.  Admit HPI / Brief Narrative: 59 y.o. BM PMHx HTN, diabetes type 2 uncontrolled, alcohol abuse, brought to [facility] ED 5/4 with seizures. His wife reported to EDP that he was last seen normal at 740pm then fell out of his bed at 830pm and was diaphoretic. EMS was dispatched and on their arrival, he was found to be actively seizing, CBG of 421, SBP of 230, incontinent, and combative. Wife also reported to EDP that pt drinks alcohol daily but has never had an alcohol withdrawal seizure to her knowledge. Last drink was on night prior to ED presentation. She informed EDP that prior to him going to bed, pt was in his USOH, talking and walking normally.  In ED, pt was extremely combative and had AMS requiring intubation. EKG revealed ST elevations in pre-cordial leads without reciprocal changes. Dr. Burt Knack of cardiology was called and recommended transfer to [facility] although due to current clinical status unable to take to cath lab emergently. He advised that they would follow pt closely after he arrived at [facility].   CT of the head was negative for acute hemorrhage. Pt was transferred to [facility] uneventfully and PCCM assumed care upon arrival.  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 She reports that he drinks at least 40oz beer per day and sometimes more. His last drink was on Monday 01/21/15.  After arrival at [facility], repeat EKG showed worsening ST elevation in precordial leads with t wave inversions c/w anteroseptal infarct. Repeat troponin > 65. Dr. Aundra Dubin called and pt will be taken to cath lab emergently.   HPI/Subjective: 5/12  A/O 3 (unsure where), sitting in bed comfortably eating his lunch.   Assessment/Plan: Hypertensive emergency -Much better control. -Continue aspirin 81 mg  daily -Coreg 25 mg BID -Lisinopril 10 mg daily  Acute metabolic encephalopathy  -Multi-factorial, secondary to alcoholism, uncontrolled HTN, and stroke -Beginning to clear  Seizure -Most likely secondary to alcoholism   Acute scattered multiple CVAs -Pattern suggestive of embolic process - no gross evidence of cardiac thrombus on TTE - not felt to be a candidate for systemic anticoagulation - Brilinta 90 mg BID -Continue Lipitor 80 mg daily  Dysphagia -Passed swallow study, dysphagia 3 nectar thick  Anterior STEMI/Acute decompensated systolic and diastolic HF -Status post PTCI to 99% mid/distal LAD lesion with multiple other areas of disease appreciated - to have LifeVest arranged by cardiology prior to discharge  - Cardiology following  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 Recheck in 90 days, if still hyper-TG consider adding niacin   Code Status: FULL Family Communication: no family present at time of exam Disposition Plan: CIR vs SNF    Consultants: Dr.Mark Lurline Del (cardiology) Dr. Rush Farmer [facility] M) Dr.Peter Lincoln Brigham (neurology)   Procedure/Significant Events: 5/4 EEG;Abnormal EEG due to generalized low voltage slowing indicating  a mild to moderate cerebral disturbance (encephalopathy). Medication side effect? 5/4 CT head without contrast;Moderate chronic atrophy and mild chronic small vessel changes. No acute intracranial findings. Mild motion degradation of the  images. 5/5 MRI brain without contrast;Multiple small foci of acute ischemia spanning multiple vascular Territories; c/w embolic event.   Culture NA  Antibiotics: NA  DVT prophylaxis: Brilinta   Devices NA   LINES / TUBES:  NA    Continuous Infusions: . feeding supplement (JEVITY 1.2 CAL) 1,000 mL (01/30/15 0604)    Objective: VITAL SIGNS: Temp: 97.5 F (36.4 C) (05/12 2005) Temp Source: Axillary (05/12 2005) BP: 123/82 mmHg (05/12 2200) Pulse Rate: 98 (05/12 2200) SPO2; FIO2:   Intake/Output Summary (Last 24 hours) at 01/31/15 2315 Last data filed at 01/31/15 2200  Gross per 24 hour  Intake 251.83 ml  Output   1375 ml  Net -1123.17 ml     Exam: General:  A/O 3 (unsure where), No acute respiratory distress, somewhat hoarse but speech is clear. Lungs: Clear to auscultation bilaterally without wheezes or crackles Cardiovascular: Regular rate and rhythm without murmur gallop or rub normal S1 and S2 Abdomen: Nontender, nondistended, soft, bowel sounds positive, no rebound, no ascites, no appreciable mass Extremities: No significant cyanosis, clubbing, or edema bilateral lower extremities Neurologic; cranial nerves II through XII intact, tongue/uvula midline, extremity strength 5/5, sensation intact throughout, somewhat coarse but clear speech, did not ambulate patient  Data Reviewed: Basic Metabolic Panel:  Recent Labs Lab 01/26/15 0209 01/27/15 0453 01/28/15 0428 01/29/15 0430 01/30/15 0315 01/31/15 0253  NA 140 139 141 141 148* 148*  K 3.9 3.6 3.4* 3.1* 4.0 3.4*  CL 107 101 101 104 108 110  CO2 23 26 28 25 25 25   GLUCOSE 169* 274* 209* 106* 140* 105*  BUN 28* 38* 47* 42* 43* 43*  CREATININE 1.81* 1.81* 1.83* 1.47* 1.37* 1.48*  CALCIUM 8.6* 8.4* 8.6* 8.5* 9.8 9.7  MG 1.7 1.7 1.8 1.7 2.5*  --   PHOS 3.9 4.0 4.4 3.9 3.9  --    Liver Function Tests:  Recent Labs Lab 01/31/15 0253  AST 34  ALT 36  ALKPHOS 71  BILITOT 0.5  PROT 8.2*   ALBUMIN 2.9*   No results for input(s): LIPASE, AMYLASE in the last 168 hours. No results for input(s): AMMONIA in the last 168 hours. CBC:  Recent Labs Lab 01/27/15 0453 01/28/15 0428 01/29/15 0430 01/30/15 0315 01/31/15 0253  WBC 10.4 10.6* 10.9* 12.7* 11.6*  HGB 10.4* 10.4* 10.9* 12.4* 13.2  HCT 30.0* 30.0* 31.7* 35.3* 38.4*  MCV 88.5 87.7 87.3 87.8 88.1  PLT 217 228 258 342 384   Cardiac Enzymes: No results for input(s): CKTOTAL, CKMB, CKMBINDEX, TROPONINI in the last 168 hours. BNP (last 3 results) No results for input(s): BNP in the last 8760 hours.  ProBNP (last 3 results) No results for input(s): PROBNP in the last 8760 hours.  CBG:  Recent Labs Lab 01/31/15 0432 01/31/15 0753 01/31/15 1319 01/31/15 1639 01/31/15 2008  GLUCAP 108* 137* 197* 195* 320*    Recent Results (from the past 240 hour(s))  MRSA PCR Screening     Status: None   Collection Time: 01/23/15  4:24 AM  Result Value Ref Range Status   MRSA by PCR NEGATIVE NEGATIVE Final    Comment:        The GeneXpert MRSA Assay (FDA approved for NASAL specimens only), is one component of a comprehensive MRSA colonization surveillance program. It is not intended  to diagnose MRSA infection nor to guide or monitor treatment for MRSA infections.      Studies:  Recent x-ray studies have been reviewed in detail by the Attending Physician  Scheduled Meds:  Scheduled Meds: . antiseptic oral rinse  7 mL Mouth Rinse QID  . aspirin  81 mg Per Tube Daily  . atorvastatin  80 mg Per Tube q1800  . carvedilol  25 mg Oral BID WC  . chlorhexidine  15 mL Mouth Rinse BID  . famotidine  20 mg Per Tube BID  . folic acid  1 mg Per Tube Daily  . insulin aspart  0-15 Units Subcutaneous 6 times per day  . insulin glargine  14 Units Subcutaneous Daily  . levETIRAcetam  500 mg Oral Q12H   Or  . levETIRAcetam  500 mg Intravenous Q12H  . lisinopril  10 mg Per Tube Daily  . thiamine  100 mg Per Tube Daily  .  ticagrelor  90 mg Per Tube BID    Time spent on care of this patient: 40 mins   Elspeth Blucher, Geraldo Docker , MD  [facility] Office  [phone] Pager - [phone]  On-Call/Text Page:      [email or web address]      password [email or web address]  If 7PM-7AM, please contact night-coverage [email or web address] Password [email or web address] 01/31/2015, 11:15 PM   LOS: 8 days   Care during the described time interval was provided by me .  I have reviewed this patient's available data, including medical history, events of note, physical examination, radiology studies and test results as part of my evaluation  Dia Crawford, MD [phone] Pager

## 2015-01-31 NOTE — Progress Notes (Signed)
Patient Name: Timothy Lambert Date of Encounter: 01/31/2015  Primary Cardiologist: new   Principal Problem:   STEMI (ST elevation myocardial infarction) Active Problems:   Hypertensive emergency   Acute MI anterior wall first episode care   Altered mental state   Acute respiratory failure with hypoxia   Acute renal failure syndrome   Acute respiratory failure   Acute embolic stroke   Hyperglycemia   Seizure   Altered mental status    SUBJECTIVE  Brother at bedside. Denies any CP or SOB.   CURRENT MEDS . antiseptic oral rinse  7 mL Mouth Rinse QID  . aspirin  81 mg Per Tube Daily  . atorvastatin  80 mg Per Tube q1800  . chlorhexidine  15 mL Mouth Rinse BID  . famotidine  20 mg Per Tube BID  . folic acid  1 mg Per Tube Daily  . hydrochlorothiazide  13 mg Per Tube Daily  . insulin aspart  0-15 Units Subcutaneous 6 times per day  . insulin glargine  14 Units Subcutaneous Daily  . levETIRAcetam  500 mg Oral Q12H   Or  . levETIRAcetam  500 mg Intravenous Q12H  . lisinopril  10 mg Per Tube Daily  . metoprolol  5 mg Intravenous 4 times per day  . thiamine  100 mg Per Tube Daily  . ticagrelor  90 mg Per Tube BID    OBJECTIVE  Filed Vitals:   01/31/15 0100 01/31/15 0400 01/31/15 0443 01/31/15 0749  BP: 145/64 153/101  164/84  Pulse: 90 80  90  Temp:  97.6 F (36.4 C)  96.5 F (35.8 C)  TempSrc:  Axillary  Axillary  Resp: 10 17  12   Height:      Weight:   140 lb 6.9 oz (63.7 kg)   SpO2: 95% 97%  96%    Intake/Output Summary (Last 24 hours) at 01/31/15 1005 Last data filed at 01/31/15 0100  Gross per 24 hour  Intake 301.83 ml  Output    525 ml  Net -223.17 ml   Filed Weights   01/29/15 1632 01/30/15 0500 01/31/15 0443  Weight: 144 lb 8 oz (65.545 kg) 140 lb 14.4 oz (63.912 kg) 140 lb 6.9 oz (63.7 kg)    PHYSICAL EXAM  General: Pleasant, NAD. Neuro: Alert and oriented X 3. Moves all extremities spontaneously. Psych: Normal affect. HEENT:   Normal. Neck: Supple without bruits or JVD. Lungs:  Resp regular and unlabored, CTA. Rhonchi/stridor on anterior exam near throat, however no rale or rhonchi when auscultated posteriorly Heart: RRR no s3, s4, or murmurs appreciated Abdomen: Soft, non-tender, non-distended, BS + x 4.  Extremities: No clubbing, cyanosis or edema. DP/PT/Radials 2+ and equal bilaterally.  Accessory Clinical Findings  CBC  Recent Labs  01/30/15 0315 01/31/15 0253  WBC 12.7* 11.6*  HGB 12.4* 13.2  HCT 35.3* 38.4*  MCV 87.8 88.1  PLT 342 0000000   Basic Metabolic Panel  Recent Labs  01/29/15 0430 01/30/15 0315 01/31/15 0253  NA 141 148* 148*  K 3.1* 4.0 3.4*  CL 104 108 110  CO2 25 25 25   GLUCOSE 106* 140* 105*  BUN 42* 43* 43*  CREATININE 1.47* 1.37* 1.48*  CALCIUM 8.5* 9.8 9.7  MG 1.7 2.5*  --   PHOS 3.9 3.9  --    Fasting Lipid Panel  Recent Labs  01/29/15 0430  TRIG 192*    TELE Sinus tach with HR 90s, no recurrent a-flutter    ECG  No  new EKG. Last 01/24/15. On Tele.  Echocardiogram 01/24/2015  LV EF: 30% -  35%  ------------------------------------------------------------------- Indications:   MI - acute 410.91.  ------------------------------------------------------------------- History:  PMH:  Altered mental status. Risk factors: Hypertension. Diabetes mellitus.  ------------------------------------------------------------------- Study Conclusions  - Left ventricle: The cavity size was normal. Wall thickness was increased in a pattern of moderate LVH. Systolic function was moderately to severely reduced. The estimated ejection fraction was in the range of 30% to 35%. Mid to apical anteroseptal akinesis, apical lateral akinesis, apical inferior akinesis, akinesis of true apex. Anterior hypokinesis. Doppler parameters are consistent with abnormal left ventricular relaxation (grade 1 diastolic dysfunction). - Aortic valve: There was no  stenosis. - Mitral valve: There was trivial regurgitation. - Left atrium: The atrium was mildly dilated. - Right ventricle: The cavity size was normal. Systolic function was normal. - Pulmonary arteries: PA peak pressure: 66 mm Hg (S). - Systemic veins: IVC measured 2.5 cm with < 50% respirophasic variation, suggesting RA pressure 15 mmHg. - Pericardium, extracardiac: A trivial pericardial effusion was identified.  Impressions:  - Normal LV size with moderate LV hypertrophy. EF 30-35% with wall motion abnormalities as noted above. Normal RV size and systolic function. No significant valvular abnormalities. Moderate pulmonary hypertension.    Radiology/Studies  Ct Head Wo Contrast  01/23/2015   CLINICAL DATA:  Altered mental status  EXAM: CT HEAD WITHOUT CONTRAST  TECHNIQUE: Contiguous axial images were obtained from the base of the skull through the vertex without intravenous contrast.  COMPARISON:  None.  FINDINGS: There is no intracranial hemorrhage, mass or evidence of acute infarction. There is moderate generalized atrophy. There is mild chronic microvascular ischemic change. There is no significant extra-axial fluid collection.  No acute intracranial findings are evident.  IMPRESSION: Moderate chronic atrophy and mild chronic small vessel changes. No acute intracranial findings. Mild motion degradation of the images.   Electronically Signed   By: Andreas Newport M.D.   On: 01/23/2015 00:33   Mr Brain Wo Contrast  01/24/2015   CLINICAL DATA:  Altered mental status, fell out of bed at 8:30 p.m., diaphoretic, witnessed seizure with incontinence. Possible alcohol withdrawal.  EXAM: MRI HEAD WITHOUT CONTRAST  TECHNIQUE: Multiplanar, multiecho pulse sequences of the brain and surrounding structures were obtained without intravenous contrast.  COMPARISON:  CT of the head Jan 23, 2015  FINDINGS: Multiple small foci of reduced diffusion and bifrontal lobes, bilateral occipital lobes,  RIGHT parietal lobe and, RIGHT cerebellum, measuring up to 12 mm and RIGHT occipital lobe. Corresponding low ADC values. No susceptibility artifact to suggest hemorrhage. Ventricles and sulci are upper limits of normal in size for patient's age. Patchy white matter T2 hyperintensities noted, exclusive of the aforementioned abnormality.  No abnormal extra-axial fluid collections. Major intracranial vascular flow voids seen at the skull base. Imaged ocular globes and orbital contents are unremarkable. Mild paranasal sinus mucosal thickening without air-fluid levels. Mild bilateral mastoid effusions. No abnormal sellar expansion. No cerebellar tonsillar ectopia. No abnormal calvarial bone marrow signal. Patient is edentulous.  IMPRESSION: Multiple small foci of acute ischemia spanning multiple vascular territories (involving the cerebrum and cerebellum) most consistent with embolic event.  Parenchymal brain volume loss, upper limits of normal for age. Minimal white matter changes suggest chronic small vessel ischemic disease.   Electronically Signed   By: Elon Alas   On: 01/24/2015 03:51   Dg Chest Port 1 View  01/28/2015   CLINICAL DATA:  Respiratory failure.  EXAM: PORTABLE CHEST - 1  VIEW  COMPARISON:  None.  FINDINGS: Endotracheal tube and NG tube in stable position. Right IJ line stable position. Mediastinum and hilar structures are normal. Stable cardiomegaly. Stable bibasilar subsegmental atelectasis and/or infiltrates. No pleural effusion or pneumothorax  IMPRESSION: 1. Lines and tubes in stable position. 2. Stable bibasilar atelectasis and/or infiltrates.   Electronically Signed   By: [place]   On: 01/28/2015 07:07   Dg Chest Port 1 View  01/27/2015   CLINICAL DATA:  Respiratory failure. Intubated patient. History of coronary artery disease, hypertension and diabetes.  EXAM: PORTABLE CHEST - 1 VIEW  COMPARISON:  01/26/2015  FINDINGS: Endotracheal tube tip projects 2 cm above the carina.  Nasogastric tube and right internal jugular central venous line are stable and well positioned. Mild hazy opacity in the medial left lung base appears mildly improved. This could reflect pneumonia or atelectasis. Remainder of the lungs is clear. No pneumothorax.  IMPRESSION: 1. Mild improvement in left lung base opacity which may reflect improved pneumonia or improved atelectasis. No new lung abnormalities. 2. Support apparatus is well positioned.   Electronically Signed   By: Lajean Manes M.D.   On: 01/27/2015 07:45   Dg Chest Port 1 View  01/26/2015   CLINICAL DATA:  Acute respiratory failure  EXAM: PORTABLE CHEST - 1 VIEW  COMPARISON:  01/25/2015  FINDINGS: The endotracheal tube tip is 3.7 cm above the carina. The nasogastric tube extends into the stomach. Mild airspace opacity persists in the left base without significant interval change. There is no pneumothorax.  IMPRESSION: Support equipment appears satisfactorily positioned.  No significant interval change in the bilateral airspace opacities.   Electronically Signed   By: Andreas Newport M.D.   On: 01/26/2015 06:07   Dg Chest Port 1 View  01/25/2015   CLINICAL DATA:  Respiratory failure  EXAM: PORTABLE CHEST - 1 VIEW  COMPARISON:  01/24/2015  FINDINGS: The endotracheal tube tip is 3.4 cm above the carina. The right jugular central line extends into the SVC just below the azygos vein junction. The nasogastric tube extends into the stomach. There is improvement, with partial clearance of central and basilar airspace opacities. Mild consolidation persists in the left base. There is no pneumothorax.  IMPRESSION: Support equipment appears satisfactorily positioned.  Improved, with partial clearance of central and basilar airspace opacities. This probably represents resolving pulmonary edema.   Electronically Signed   By: Andreas Newport M.D.   On: 01/25/2015 06:12   Dg Chest Port 1 View  01/24/2015   CLINICAL DATA:  Endotracheal tube placement  EXAM:  PORTABLE CHEST - 1 VIEW  COMPARISON:  01/23/2015  FINDINGS: Cardiac shadow is mildly enlarged but stable. A nasogastric catheter is been removed in the interval. A right central venous line is seen in the mid superior vena cava. Endotracheal tube is noted 2.4 cm above the carina. Diffuse increased density noted throughout both lungs similar to that seen on the prior exam consistent with a degree of pulmonary edema.  IMPRESSION: Stable pulmonary edema.  Tubes and lines as described.   Electronically Signed   By: Inez Catalina M.D.   On: 01/24/2015 15:17   Dg Chest Port 1 View  01/23/2015   CLINICAL DATA:  Central line placement and gastric tube placement  EXAM: PORTABLE CHEST - 1 VIEW  COMPARISON:  None.  FINDINGS: The endotracheal tube tip is 3.5 cm above the carina. The gastric tube reaches the stomach but the side port is at or above the EG  junction. The tube should be advanced for optimal placement. The right jugular central line extends into the SVC.  There is no pneumothorax. There is mild central ground-glass opacity without dense focal airspace consolidation. There is no large effusion.  IMPRESSION: Satisfactory ET tube position. Gastric tube reaches the stomach but should be advanced for optimal placement. Central line is satisfactorily positioned. No pneumothorax.   Electronically Signed   By: Andreas Newport M.D.   On: 01/23/2015 05:31   Dg Chest Port 1 View  01/23/2015   CLINICAL DATA:  Seizure  EXAM: PORTABLE CHEST - 1 VIEW  COMPARISON:  None.  FINDINGS: Endotracheal tube 2.8 cm from carina. Normal cardiac silhouette. There is a left lower lobe opacity posterior to the heart. No pleural fluid. No pneumothorax. No overt pulmonary edema.  IMPRESSION: 1. Endotracheal tube in good position. 2. Left lower lobe atelectasis versus infiltrate.   Electronically Signed   By: Suzy Bouchard M.D.   On: 01/23/2015 00:02   Dg Abd Portable 1v  01/30/2015   CLINICAL DATA:  Nasogastric tube placement  EXAM:  PORTABLE ABDOMEN - 1 VIEW  COMPARISON:  01/29/2015  FINDINGS: The enteric tube extends into the stomach with tip over the region of the proximal antrum.  IMPRESSION: Enteric tube extends into the stomach with tip in the proximal antrum   Electronically Signed   By: Andreas Newport M.D.   On: 01/30/2015 02:20   Dg Abd Portable 1v  01/29/2015   CLINICAL DATA:  Evaluate feeding tube placement.  EXAM: PORTABLE ABDOMEN - 1 VIEW  COMPARISON:  01/24/2015  FINDINGS: Small bore feeding tube is noted with tip in the peripyloric region.  The bowel gas pattern is unremarkable.  IMPRESSION: Feeding tube tip overlying the peripyloric region.   Electronically Signed   By: Margarette Canada M.D.   On: 01/29/2015 13:19   Dg Abd Portable 1v  01/24/2015   CLINICAL DATA:  NG tube placement  EXAM: PORTABLE ABDOMEN - 1 VIEW  COMPARISON:  Prior film same day  FINDINGS: There is NG tube with tip in left upper quadrant medially probable within proximal stomach. The tip of the NG tube is coilled.  IMPRESSION: NG tube with tip coiled in left upper quadrant medially probable within proximal stomach.   Electronically Signed   By: Lahoma Crocker M.D.   On: 01/24/2015 21:25   Dg Abd Portable 1v  01/24/2015   CLINICAL DATA:  Orogastric tube placement  EXAM: PORTABLE ABDOMEN - 1 VIEW  COMPARISON:  01/24/2015 3:08 p.m.  FINDINGS: Endotracheal tube and right neck catheter are in stable position. There is a new orogastric tube which is coiled in the distal esophagus with tip still at the level of the lower esophagus.  The upper abdominal bowel gas pattern is nonobstructive. Haziness of the lower chest, with further evaluation limited by extensive respiratory motion.  These results will be called to the ordering clinician or representative by the Radiologist Assistant, and communication documented in the PACS or zVision Dashboard.  IMPRESSION: The new orogastric tube is coiled in the distal esophagus. Recommend repositioning.   Electronically Signed    By: Monte Fantasia M.D.   On: 01/24/2015 19:19    ASSESSMENT AND PLAN  59 yo male brought to [facility] 5/4 with seizure. Noted to have AMS and extremely agitated requiring intubation. EKG showed anterior ST elevation with trop >65. Pt taken for emergent cath. Cath 5/4 99% mid/distal LAD treated with DES, 50% L main dx, 90% D1  stenosis, 75% diffuse ramus, 50% OM1, 70% mid RCA stenosis, 40% RPDA. EEG obtained on 5/4 abnormal with mild to moderate cerebral disturbance but no seizure. MRI of brain with multiple small foci of acute ischemia c/w embolic event. Echo 5/5 EF 30-35%.  1. Anterior STEMI  - Cath 5/4 99% mid/distal LAD treated with DES, 50% L main dx, 90% D1 stenosis, 75% diffuse ramus, 50% OM1, 70% mid RCA stenosis, 40% RPDA  - echo 01/24/2015, will need LifeVest on discharge, Repeat echo in 3 month, if low need ICD. Spoke with MD about LifeVest, not sure if pt good candidate given mental status and compliance, however did discuss with [employer] rep   - no LV thrombus noted on 2D echo, continue ASA, Brilinta. Started on IV Metoprolol 01/31/15 after he pulled NG tube, s/p swallow eval, cleared for dysphagia III diet with honey thick nector along with crushed pill, will restart 25mg  BID coreg. D/C IV metoprolol. Continue ACEI, ASA and brilinta. Hold IV HCTZ  2. Acute decompensated systolic and diastolic HF  - Echo [id number] EF 30-35%, PA peak pressure 66mg , significant wall motion abnormalities.  - no acute sign of HF, however likely will need 20-40mg  daily of PO lasix on discharge.  3. Acute CVA, likely embolic  - no LV thrombus noted on 2D echo, continue ASA, Brilinta. Not good anticoagulation candidate given his EtOH abuse  4. Hypertensive emergency.  -Coreg replaced with Lopressor 01/31/15.  Will resume coreg today along with lisinopril. Hold IV metoprolol and IV HCTZ  5. NPO status. Failed swallow eval   - Pt more alert with better phonation today per Speech, however only oriented x2 (think today  is 1970s)  -To undergo instrumental swallow study this AM per Speech  6. ETOH abuse with h/o seizure  7. Proxysmal atrial flutter:  - CHA2-DS2-VAsc score 4 (HTN, HF, stroke)  - Brief AFlutter 01/30/15  - not candidate for systemic anticoagulation  Signed, Almyra Deforest PA-C Pager: [phone]  Personally seen and examined. Agree with above. Mitt[place] on.  Slightly more conversant PO meds Agree with lasix on DC  Candee Furbish, MD

## 2015-02-01 ENCOUNTER — Inpatient Hospital Stay (HOSPITAL_COMMUNITY)
Admission: RE | Admit: 2015-02-01 | Discharge: 2015-02-14 | DRG: 056 | Disposition: A | Discharge status: Home Health | Payer: Medicaid Other | Source: Intra-hospital | Attending: Physical Medicine & Rehabilitation | Admitting: Physical Medicine & Rehabilitation

## 2015-02-01 ENCOUNTER — Telehealth: Payer: Self-pay | Admitting: Cardiovascular Disease

## 2015-02-01 ENCOUNTER — Encounter (HOSPITAL_COMMUNITY): Payer: Self-pay | Admitting: *Deleted

## 2015-02-01 DIAGNOSIS — I69391 Dysphagia following cerebral infarction: Principal | ICD-10-CM

## 2015-02-01 DIAGNOSIS — G40909 Epilepsy, unspecified, not intractable, without status epilepticus: Secondary | ICD-10-CM

## 2015-02-01 DIAGNOSIS — E1142 Type 2 diabetes mellitus with diabetic polyneuropathy: Secondary | ICD-10-CM | POA: Diagnosis not present

## 2015-02-01 DIAGNOSIS — I5021 Acute systolic (congestive) heart failure: Secondary | ICD-10-CM | POA: Diagnosis not present

## 2015-02-01 DIAGNOSIS — Z955 Presence of coronary angioplasty implant and graft: Secondary | ICD-10-CM | POA: Diagnosis not present

## 2015-02-01 DIAGNOSIS — R4182 Altered mental status, unspecified: Secondary | ICD-10-CM | POA: Diagnosis present

## 2015-02-01 DIAGNOSIS — R1310 Dysphagia, unspecified: Secondary | ICD-10-CM

## 2015-02-01 DIAGNOSIS — E860 Dehydration: Secondary | ICD-10-CM | POA: Diagnosis not present

## 2015-02-01 DIAGNOSIS — E1165 Type 2 diabetes mellitus with hyperglycemia: Secondary | ICD-10-CM | POA: Diagnosis not present

## 2015-02-01 DIAGNOSIS — I2510 Atherosclerotic heart disease of native coronary artery without angina pectoris: Secondary | ICD-10-CM

## 2015-02-01 DIAGNOSIS — I5041 Acute combined systolic (congestive) and diastolic (congestive) heart failure: Secondary | ICD-10-CM

## 2015-02-01 DIAGNOSIS — Z7982 Long term (current) use of aspirin: Secondary | ICD-10-CM

## 2015-02-01 DIAGNOSIS — N179 Acute kidney failure, unspecified: Secondary | ICD-10-CM | POA: Diagnosis not present

## 2015-02-01 DIAGNOSIS — F1010 Alcohol abuse, uncomplicated: Secondary | ICD-10-CM | POA: Diagnosis not present

## 2015-02-01 DIAGNOSIS — Z87891 Personal history of nicotine dependence: Secondary | ICD-10-CM | POA: Diagnosis not present

## 2015-02-01 DIAGNOSIS — I10 Essential (primary) hypertension: Secondary | ICD-10-CM | POA: Diagnosis not present

## 2015-02-01 DIAGNOSIS — R410 Disorientation, unspecified: Secondary | ICD-10-CM

## 2015-02-01 DIAGNOSIS — E875 Hyperkalemia: Secondary | ICD-10-CM

## 2015-02-01 DIAGNOSIS — I2109 ST elevation (STEMI) myocardial infarction involving other coronary artery of anterior wall: Secondary | ICD-10-CM | POA: Diagnosis not present

## 2015-02-01 DIAGNOSIS — I6340 Cerebral infarction due to embolism of unspecified cerebral artery: Secondary | ICD-10-CM | POA: Diagnosis present

## 2015-02-01 DIAGNOSIS — B9620 Unspecified Escherichia coli [E. coli] as the cause of diseases classified elsewhere: Secondary | ICD-10-CM

## 2015-02-01 DIAGNOSIS — N390 Urinary tract infection, site not specified: Secondary | ICD-10-CM | POA: Diagnosis not present

## 2015-02-01 DIAGNOSIS — I509 Heart failure, unspecified: Secondary | ICD-10-CM | POA: Diagnosis not present

## 2015-02-01 DIAGNOSIS — G9341 Metabolic encephalopathy: Secondary | ICD-10-CM

## 2015-02-01 DIAGNOSIS — E785 Hyperlipidemia, unspecified: Secondary | ICD-10-CM | POA: Diagnosis not present

## 2015-02-01 DIAGNOSIS — I5022 Chronic systolic (congestive) heart failure: Secondary | ICD-10-CM | POA: Diagnosis not present

## 2015-02-01 DIAGNOSIS — I4892 Unspecified atrial flutter: Secondary | ICD-10-CM | POA: Diagnosis not present

## 2015-02-01 DIAGNOSIS — I252 Old myocardial infarction: Secondary | ICD-10-CM | POA: Diagnosis present

## 2015-02-01 DIAGNOSIS — R4020 Unspecified coma: Secondary | ICD-10-CM | POA: Diagnosis not present

## 2015-02-01 DIAGNOSIS — I213 ST elevation (STEMI) myocardial infarction of unspecified site: Secondary | ICD-10-CM | POA: Diagnosis not present

## 2015-02-01 DIAGNOSIS — I6931 Cognitive deficits following cerebral infarction: Secondary | ICD-10-CM | POA: Diagnosis not present

## 2015-02-01 HISTORY — DX: Unspecified convulsions: R56.9

## 2015-02-01 HISTORY — DX: Acute myocardial infarction, unspecified: I21.9

## 2015-02-01 HISTORY — DX: Cerebral infarction, unspecified: I63.9

## 2015-02-01 LAB — GLUCOSE, CAPILLARY
GLUCOSE-CAPILLARY: 226 mg/dL — AB (ref 65–99)
Glucose-Capillary: 113 mg/dL — ABNORMAL HIGH (ref 65–99)
Glucose-Capillary: 130 mg/dL — ABNORMAL HIGH (ref 65–99)
Glucose-Capillary: 218 mg/dL — ABNORMAL HIGH (ref 65–99)
Glucose-Capillary: 364 mg/dL — ABNORMAL HIGH (ref 65–99)
Glucose-Capillary: 365 mg/dL — ABNORMAL HIGH (ref 65–99)

## 2015-02-01 MED ORDER — LISINOPRIL 10 MG PO TABS
10.0000 mg | ORAL_TABLET | Freq: Every day | ORAL | Status: DC
  Administered 2015-02-02 – 2015-02-08 (×6): 10 mg via ORAL
  Filled 2015-02-01 (×8): qty 1

## 2015-02-01 MED ORDER — ATORVASTATIN CALCIUM 80 MG PO TABS
80.0000 mg | ORAL_TABLET | Freq: Every day | ORAL | Status: DC
  Administered 2015-02-02 – 2015-02-13 (×11): 80 mg via ORAL
  Filled 2015-02-01 (×13): qty 1

## 2015-02-01 MED ORDER — LISINOPRIL 10 MG PO TABS: 10.0000 mg | ORAL_TABLET | Freq: Every day | ORAL | Status: DC

## 2015-02-01 MED ORDER — VITAMIN B-1 100 MG PO TABS
100.0000 mg | ORAL_TABLET | Freq: Every day | ORAL | Status: DC
  Administered 2015-02-02 – 2015-02-14 (×13): 100 mg via ORAL
  Filled 2015-02-01 (×14): qty 1

## 2015-02-01 MED ORDER — VITAMIN B-1 100 MG PO TABS: 100.0000 mg | ORAL_TABLET | Freq: Every day | ORAL | Status: DC

## 2015-02-01 MED ORDER — DOCUSATE SODIUM 50 MG/5ML PO LIQD
100.0000 mg | Freq: Two times a day (BID) | ORAL | Status: DC | PRN
  Administered 2015-02-02: 100 mg via ORAL
  Filled 2015-02-01 (×2): qty 10

## 2015-02-01 MED ORDER — DOCUSATE SODIUM 50 MG/5ML PO LIQD: 100.0000 mg | Freq: Two times a day (BID) | ORAL | Status: DC | PRN

## 2015-02-01 MED ORDER — TICAGRELOR 90 MG PO TABS
90.0000 mg | ORAL_TABLET | Freq: Two times a day (BID) | ORAL | Status: DC
  Administered 2015-02-01 – 2015-02-14 (×26): 90 mg via ORAL
  Filled 2015-02-01 (×29): qty 1

## 2015-02-01 MED ORDER — CARVEDILOL 25 MG PO TABS: 25.0000 mg | ORAL_TABLET | Freq: Two times a day (BID) | ORAL | Status: DC

## 2015-02-01 MED ORDER — INSULIN GLARGINE 100 UNIT/ML ~~LOC~~ SOLN: 14.0000 [IU] | Freq: Every day | SUBCUTANEOUS | Status: DC

## 2015-02-01 MED ORDER — LEVETIRACETAM 100 MG/ML PO SOLN
500.0000 mg | Freq: Two times a day (BID) | ORAL | Status: DC
  Administered 2015-02-01 – 2015-02-13 (×25): 500 mg via ORAL
  Filled 2015-02-01 (×28): qty 5

## 2015-02-01 MED ORDER — ONDANSETRON HCL 4 MG/2ML IJ SOLN: 4.0000 mg | Freq: Four times a day (QID) | INTRAMUSCULAR | Status: DC | PRN

## 2015-02-01 MED ORDER — ACETAMINOPHEN 325 MG PO TABS: 650.0000 mg | ORAL_TABLET | ORAL | Status: DC | PRN

## 2015-02-01 MED ORDER — FOLIC ACID 1 MG PO TABS: 1.0000 mg | ORAL_TABLET | Freq: Every day | ORAL | Status: DC

## 2015-02-01 MED ORDER — ASPIRIN 81 MG PO CHEW
81.0000 mg | CHEWABLE_TABLET | Freq: Every day | ORAL | Status: DC
  Administered 2015-02-02 – 2015-02-14 (×13): 81 mg via ORAL
  Filled 2015-02-01 (×13): qty 1

## 2015-02-01 MED ORDER — RESOURCE THICKENUP CLEAR PO POWD: 1.0000 | ORAL | Status: DC | PRN

## 2015-02-01 MED ORDER — FAMOTIDINE 20 MG PO TABS: 20.0000 mg | ORAL_TABLET | Freq: Two times a day (BID) | ORAL | Status: DC

## 2015-02-01 MED ORDER — FAMOTIDINE 20 MG PO TABS
20.0000 mg | ORAL_TABLET | Freq: Two times a day (BID) | ORAL | Status: DC
  Filled 2015-02-01: qty 1

## 2015-02-01 MED ORDER — INSULIN ASPART 100 UNIT/ML ~~LOC~~ SOLN
0.0000 [IU] | Freq: Three times a day (TID) | SUBCUTANEOUS | Status: DC
  Administered 2015-02-02: 11 [IU] via SUBCUTANEOUS
  Administered 2015-02-02: 5 [IU] via SUBCUTANEOUS
  Administered 2015-02-02: 15 [IU] via SUBCUTANEOUS
  Administered 2015-02-03: 11 [IU] via SUBCUTANEOUS
  Administered 2015-02-03: 15 [IU] via SUBCUTANEOUS
  Administered 2015-02-03: 8 [IU] via SUBCUTANEOUS
  Administered 2015-02-04: 3 [IU] via SUBCUTANEOUS
  Administered 2015-02-04: 8 [IU] via SUBCUTANEOUS
  Administered 2015-02-04: 3 [IU] via SUBCUTANEOUS
  Administered 2015-02-05: 5 [IU] via SUBCUTANEOUS
  Administered 2015-02-05: 11 [IU] via SUBCUTANEOUS
  Administered 2015-02-05: 2 [IU] via SUBCUTANEOUS
  Administered 2015-02-06: 8 [IU] via SUBCUTANEOUS
  Administered 2015-02-06: 15 [IU] via SUBCUTANEOUS
  Administered 2015-02-06: 3 [IU] via SUBCUTANEOUS
  Administered 2015-02-07: 2 [IU] via SUBCUTANEOUS
  Administered 2015-02-07: 8 [IU] via SUBCUTANEOUS
  Administered 2015-02-07: 3 [IU] via SUBCUTANEOUS
  Administered 2015-02-08: 8 [IU] via SUBCUTANEOUS
  Administered 2015-02-09: 3 [IU] via SUBCUTANEOUS
  Administered 2015-02-09: 8 [IU] via SUBCUTANEOUS
  Administered 2015-02-09: 3 [IU] via SUBCUTANEOUS
  Administered 2015-02-10: 5 [IU] via SUBCUTANEOUS
  Administered 2015-02-11: 3 [IU] via SUBCUTANEOUS
  Administered 2015-02-11: 2 [IU] via SUBCUTANEOUS
  Administered 2015-02-11: 3 [IU] via SUBCUTANEOUS
  Administered 2015-02-12: 5 [IU] via SUBCUTANEOUS
  Administered 2015-02-12 (×2): 3 [IU] via SUBCUTANEOUS
  Administered 2015-02-13: 2 [IU] via SUBCUTANEOUS
  Administered 2015-02-13: 5 [IU] via SUBCUTANEOUS
  Administered 2015-02-13: 2 [IU] via SUBCUTANEOUS

## 2015-02-01 MED ORDER — FUROSEMIDE 20 MG PO TABS
20.0000 mg | ORAL_TABLET | Freq: Every day | ORAL | Status: DC
  Administered 2015-02-01: 20 mg via ORAL
  Filled 2015-02-01: qty 1

## 2015-02-01 MED ORDER — ATORVASTATIN CALCIUM 80 MG PO TABS
80.0000 mg | ORAL_TABLET | Freq: Every day | ORAL | Status: DC
  Administered 2015-02-01: 80 mg via ORAL
  Filled 2015-02-01: qty 1

## 2015-02-01 MED ORDER — CARVEDILOL 25 MG PO TABS
25.0000 mg | ORAL_TABLET | Freq: Two times a day (BID) | ORAL | Status: DC
  Administered 2015-02-02 – 2015-02-13 (×22): 25 mg via ORAL
  Filled 2015-02-01 (×27): qty 1

## 2015-02-01 MED ORDER — THIAMINE HCL 100 MG PO TABS: 100.0000 mg | ORAL_TABLET | Freq: Every day | ORAL | Status: DC

## 2015-02-01 MED ORDER — TICAGRELOR 90 MG PO TABS
90.0000 mg | ORAL_TABLET | Freq: Two times a day (BID) | ORAL | Status: DC
  Filled 2015-02-01: qty 1

## 2015-02-01 MED ORDER — LEVETIRACETAM 100 MG/ML PO SOLN: 500.0000 mg | Freq: Two times a day (BID) | ORAL | Status: DC

## 2015-02-01 MED ORDER — ASPIRIN 81 MG PO CHEW: 81.0000 mg | CHEWABLE_TABLET | Freq: Every day | ORAL | Status: DC

## 2015-02-01 MED ORDER — FAMOTIDINE 20 MG PO TABS
20.0000 mg | ORAL_TABLET | Freq: Two times a day (BID) | ORAL | Status: DC
  Administered 2015-02-01 – 2015-02-14 (×26): 20 mg via ORAL
  Filled 2015-02-01 (×29): qty 1

## 2015-02-01 MED ORDER — ONDANSETRON HCL 4 MG PO TABS: 4.0000 mg | ORAL_TABLET | Freq: Four times a day (QID) | ORAL | Status: DC | PRN

## 2015-02-01 MED ORDER — RESOURCE THICKENUP CLEAR PO POWD
ORAL | Status: DC | PRN
  Filled 2015-02-01: qty 125

## 2015-02-01 MED ORDER — INSULIN ASPART 100 UNIT/ML ~~LOC~~ SOLN: 0.0000 [IU] | Freq: Three times a day (TID) | SUBCUTANEOUS | Status: DC

## 2015-02-01 MED ORDER — TICAGRELOR 90 MG PO TABS: 90.0000 mg | ORAL_TABLET | Freq: Two times a day (BID) | ORAL | Status: DC

## 2015-02-01 MED ORDER — FUROSEMIDE 20 MG PO TABS
20.0000 mg | ORAL_TABLET | Freq: Every day | ORAL | Status: DC
  Administered 2015-02-02 – 2015-02-08 (×7): 20 mg via ORAL
  Filled 2015-02-01 (×8): qty 1

## 2015-02-01 MED ORDER — DOCUSATE SODIUM 50 MG/5ML PO LIQD
100.0000 mg | Freq: Two times a day (BID) | ORAL | Status: DC | PRN
  Filled 2015-02-01: qty 10

## 2015-02-01 MED ORDER — FOLIC ACID 1 MG PO TABS
1.0000 mg | ORAL_TABLET | Freq: Every day | ORAL | Status: DC
  Administered 2015-02-02 – 2015-02-14 (×13): 1 mg via ORAL
  Filled 2015-02-01 (×15): qty 1

## 2015-02-01 MED ORDER — ATORVASTATIN CALCIUM 80 MG PO TABS: 80.0000 mg | ORAL_TABLET | Freq: Every day | ORAL | Status: DC

## 2015-02-01 MED ORDER — INSULIN ASPART 100 UNIT/ML ~~LOC~~ SOLN
0.0000 [IU] | Freq: Three times a day (TID) | SUBCUTANEOUS | Status: DC
  Administered 2015-02-01 (×2): 15 [IU] via SUBCUTANEOUS

## 2015-02-01 MED ORDER — FUROSEMIDE 20 MG PO TABS: 20.0000 mg | ORAL_TABLET | Freq: Every day | ORAL | Status: DC

## 2015-02-01 MED ORDER — INSULIN GLARGINE 100 UNIT/ML ~~LOC~~ SOLN
14.0000 [IU] | Freq: Every day | SUBCUTANEOUS | Status: DC
  Administered 2015-02-02: 14 [IU] via SUBCUTANEOUS
  Filled 2015-02-01 (×2): qty 0.14

## 2015-02-01 MED ORDER — ACETAMINOPHEN 325 MG PO TABS
650.0000 mg | ORAL_TABLET | ORAL | Status: DC | PRN
  Filled 2015-02-01: qty 2

## 2015-02-01 MED ORDER — SORBITOL 70 % SOLN
30.0000 mL | Freq: Every day | Status: DC | PRN
  Administered 2015-02-09: 30 mL via ORAL
  Filled 2015-02-01: qty 30

## 2015-02-01 NOTE — Interval H&P Note (Signed)
VYNCENT RESNER was admitted today to Inpatient Rehabilitation with the diagnosis of embolic CVA.  The patient's history has been reviewed, patient examined, and there is no change in status.  Patient continues to be appropriate for intensive inpatient rehabilitation.  I have reviewed the patient's chart and labs.  Questions were answered to the patient's satisfaction. The PAPE has been reviewed and assessment remains appropriate.    SWARTZ,ZACHARY T 02/01/2015, 11:16 PM

## 2015-02-01 NOTE — Progress Notes (Signed)
Spoke with pts wife re MI, stent, HF, low sodium, Brilinta and NTG. Voiced understanding. Gave MI and HF book. She sts she has Brilinta book. Gave stent card.  [place], ACSM 3:43 PM 02/01/2015

## 2015-02-01 NOTE — Progress Notes (Signed)
Pt for dc to Rehab and the floor called and wanted the patient to be wearing his Life Vest.

## 2015-02-01 NOTE — Progress Notes (Signed)
To Rehab with nurse and tech.

## 2015-02-01 NOTE — Progress Notes (Signed)
Rehab admissions - Per Dr. Thereasa Solo, patient is medically ready today.  Bed available and will admit to acute inpatient rehab today.  Call me for questions.  RC:[phone]

## 2015-02-01 NOTE — H&P (View-Only) (Signed)
Physical Medicine and Rehabilitation Admission H&P    Chief Complaint  Patient presents with  . Seizures  . Hypertension  . Hyperglycemia  : Timothy Lambert is a 59 y.o. right handed male with history of hypertension, alcohol abuse, diabetes mellitus and coronary artery disease maintained on aspirin. Patient lives with his wife and independent up until a few weeks ago. Presented to [facility] 01/23/2015 with seizures. Extremely agitated requiring intubation for airway protection. Alcohol level and drug screen negative. CT of the head negative for acute changes. EKG with precordial ST changes concerning for ischemia/infarct. Transferred to [facility] for ongoing care. After arriving to [facility] repeat EKG with worsened ST elevations and T-wave inversions troponin greater than 65. Also noted blood pressure in the high 200s over 100s and glucose in the 400s. Underwent emergent cardiac catheterization findings of 99% LAD stenosis with stenting. He was also fitted with a life vest. Echocardiogram with ejection fraction 09% grade 1 diastolic dysfunction. MRI 01/24/2015 showing multiple small foci of acute ischemia spanning multiple vascular territories consistent with embolic event. EEG was no seizure activity noted. Generalized low voltage slowing indicating mild to moderate cerebral disturbance encephalopathy. Carotid Dopplers with no ICA stenosis. Neurology follow-up currently maintained on aspirin therapy as well as Brilinta. Nasogastric tube had been in place for nutritional support with modified barium swallow 01/31/2015 and placed on a mechanical soft nectar thick liquid diet. Physical therapy evaluation completed 01/29/2015 with recommendations of physical medicine rehabilitation consult. Patient was admitted for a comprehensive rehabilitation program   ROS Review of Systems  Unable to perform ROS: mental acuity    Past Medical History  Diagnosis Date  . Coronary artery  disease   . Hypertension   . Diabetes mellitus without complication    Past Surgical History  Procedure Laterality Date  . Cardiac catheterization N/A 01/23/2015    Procedure: Left Heart Cath and Coronary Angiography;  Surgeon: [name], MD;  Location: [facility] INVASIVE CV LAB CUPID;  Service: Cardiovascular;  Laterality: N/A;  . Cardiac catheterization N/A 01/23/2015    Procedure: Coronary Stent Intervention;  Surgeon: [name], MD;  Location: [facility] INVASIVE CV LAB CUPID;  Service: Cardiovascular;  Laterality: N/A;   History reviewed. No pertinent family history. Social History:  reports that he has quit smoking. His smoking use included Cigarettes. He has never used smokeless tobacco. He reports that he drinks about 16.8 oz of alcohol per week. He reports that he does not use illicit drugs. Allergies: No Known Allergies Medications Prior to Admission  Medication Sig Dispense Refill  . aspirin 325 MG tablet Take 325 mg by mouth every 6 (six) hours as needed for mild pain or moderate pain.      Home: Home Living Family/patient expects to be discharged to:: Private residence Living Arrangements: Spouse/significant other Type of Home: House Home Access: Stairs to enter Technical brewer of Steps: 4 Home Layout: Multi-level Alternate Level Stairs-Number of Steps: 7 Home Equipment: Environmental consultant - 2 wheels, [place] - single point   Functional History: Prior Function Level of Independence: Independent Comments: pt works full time at a [employer] Status:  Mobility: Bed Mobility Overal bed mobility: Needs Assistance Bed Mobility: Rolling, Sidelying to Sit Rolling: Mod assist Sidelying to sit: Mod assist General bed mobility comments: cues for sequence with delayed processing and hand over hand assist to reach for rail. Assist to elevate trunk and bring legs off of bed Transfers Overall transfer level: Needs assistance Transfers: Sit  to/from Stand, Risk manager Sit  to Stand: Min assist Stand pivot transfers: Mod assist General transfer comment: pt stood well from bed with cues but legs buckling with pivoting to chair with assist to control pelvis with pivot and descent to chair      ADL:    Cognition: Cognition Overall Cognitive Status: Impaired/Different from baseline Orientation Level: Oriented to person, Oriented to place, Oriented to time, Disoriented to situation Cognition Arousal/Alertness: Lethargic Behavior During Therapy: Flat affect Overall Cognitive Status: Impaired/Different from baseline Area of Impairment: Problem solving Problem Solving: Slow processing General Comments: pt with accurate orientation but slow processing  Physical Exam: Blood pressure 165/87, pulse 88, temperature 98 F (36.7 C), temperature source Oral, resp. rate 11, height _0  (1.676 m), weight 64.2 kg (141 lb 8.6 oz), SpO2 98 %. Physical Exam HENT:  Head: Normocephalic.  Eyes:  Pupil sluggish to light without nystagmus  Neck: Normal range of motion. Neck supple. No thyromegaly present.  Dysphonic voice  Cardiovascular: Normal rate and regular rhythm.  Respiratory: Effort normal and breath sounds normal. No respiratory distress.  GI: Soft. Bowel sounds are normal. He exhibits no distension.  Neurological:  Patient is alert, less restless and impulsive. Wife is at bedside.  He was able to provide his name and age as well as place. Limited awareness of his deficits. Followed simple commands. Moves all 4's with at least 4/5 strength proximal to distal in upper/lower limbs.  Skin: Skin is warm and dry.  Psychiatric:  Pleasant and cooperative   Results for orders placed or performed during the hospital encounter of 01/22/15 (from the past 48 hour(s))  Glucose, capillary     Status: Abnormal   Collection Time: 01/30/15  5:02 AM  Result Value Ref Range   Glucose-Capillary 149 (H) 70 - 99 mg/dL  Glucose, capillary     Status: Abnormal   Collection  Time: 01/30/15  8:25 AM  Result Value Ref Range   Glucose-Capillary 201 (H) 70 - 99 mg/dL  Glucose, capillary     Status: Abnormal   Collection Time: 01/30/15 12:39 PM  Result Value Ref Range   Glucose-Capillary 252 (H) 70 - 99 mg/dL  Glucose, capillary     Status: Abnormal   Collection Time: 01/30/15  5:05 PM  Result Value Ref Range   Glucose-Capillary 155 (H) 70 - 99 mg/dL  Glucose, capillary     Status: Abnormal   Collection Time: 01/30/15  8:05 PM  Result Value Ref Range   Glucose-Capillary 150 (H) 70 - 99 mg/dL  Glucose, capillary     Status: Abnormal   Collection Time: 01/30/15 11:55 PM  Result Value Ref Range   Glucose-Capillary 170 (H) 70 - 99 mg/dL  CBC     Status: Abnormal   Collection Time: 01/31/15  2:53 AM  Result Value Ref Range   WBC 11.6 (H) 4.0 - 10.5 K/uL   RBC 4.36 4.22 - 5.81 MIL/uL   Hemoglobin 13.2 13.0 - 17.0 g/dL   HCT 38.4 (L) 39.0 - 52.0 %   MCV 88.1 78.0 - 100.0 fL   MCH 30.3 26.0 - 34.0 pg   MCHC 34.4 30.0 - 36.0 g/dL   RDW 12.9 11.5 - 15.5 %   Platelets 384 150 - 400 K/uL  Comprehensive metabolic panel     Status: Abnormal   Collection Time: 01/31/15  2:53 AM  Result Value Ref Range   Sodium 148 (H) 135 - 145 mmol/L   Potassium 3.4 (L) 3.5 -  5.1 mmol/L   Chloride 110 101 - 111 mmol/L   CO2 25 22 - 32 mmol/L   Glucose, Bld 105 (H) 65 - 99 mg/dL   BUN 43 (H) 6 - 20 mg/dL   Creatinine, Ser 1.48 (H) 0.61 - 1.24 mg/dL   Calcium 9.7 8.9 - 10.3 mg/dL   Total Protein 8.2 (H) 6.5 - 8.1 g/dL   Albumin 2.9 (L) 3.5 - 5.0 g/dL   AST 34 15 - 41 U/L   ALT 36 17 - 63 U/L   Alkaline Phosphatase 71 38 - 126 U/L   Total Bilirubin 0.5 0.3 - 1.2 mg/dL   GFR calc non Af Amer 50 (L) >60 mL/min   GFR calc Af Amer 58 (L) >60 mL/min    Comment: (NOTE) The eGFR has been calculated using the CKD EPI equation. This calculation has not been validated in all clinical situations. eGFR's persistently <60 mL/min signify possible Chronic Kidney Disease.    Anion gap  13 5 - 15  Vitamin B12     Status: Abnormal   Collection Time: 01/31/15  2:53 AM  Result Value Ref Range   Vitamin B-12 1126 (H) 180 - 914 pg/mL    Comment: (NOTE) This assay is not validated for testing neonatal or myeloproliferative syndrome specimens for Vitamin B12 levels.   Folate     Status: None   Collection Time: 01/31/15  2:53 AM  Result Value Ref Range   Folate 22.2 >5.9 ng/mL  Iron and TIBC     Status: Abnormal   Collection Time: 01/31/15  2:53 AM  Result Value Ref Range   Iron 38 (L) 45 - 182 ug/dL   TIBC 224 (L) 250 - 450 ug/dL   Saturation Ratios 17 (L) 17.9 - 39.5 %   UIBC 186 ug/dL  Ferritin     Status: Abnormal   Collection Time: 01/31/15  2:53 AM  Result Value Ref Range   Ferritin 1254 (H) 24 - 336 ng/mL  Reticulocytes     Status: None   Collection Time: 01/31/15  2:53 AM  Result Value Ref Range   Retic Ct Pct 1.0 0.4 - 3.1 %   RBC. 4.27 4.22 - 5.81 MIL/uL   Retic Count, Manual 42.7 19.0 - 186.0 K/uL  Glucose, capillary     Status: Abnormal   Collection Time: 01/31/15  4:32 AM  Result Value Ref Range   Glucose-Capillary 108 (H) 65 - 99 mg/dL  Glucose, capillary     Status: Abnormal   Collection Time: 01/31/15  7:53 AM  Result Value Ref Range   Glucose-Capillary 137 (H) 65 - 99 mg/dL  Glucose, capillary     Status: Abnormal   Collection Time: 01/31/15  1:19 PM  Result Value Ref Range   Glucose-Capillary 197 (H) 65 - 99 mg/dL  Glucose, capillary     Status: Abnormal   Collection Time: 01/31/15  4:39 PM  Result Value Ref Range   Glucose-Capillary 195 (H) 65 - 99 mg/dL  Glucose, capillary     Status: Abnormal   Collection Time: 01/31/15  8:08 PM  Result Value Ref Range   Glucose-Capillary 320 (H) 65 - 99 mg/dL  Glucose, capillary     Status: Abnormal   Collection Time: 02/01/15 12:13 AM  Result Value Ref Range   Glucose-Capillary 218 (H) 65 - 99 mg/dL  Glucose, capillary     Status: Abnormal   Collection Time: 02/01/15  3:40 AM  Result Value Ref  Range   Glucose-Capillary  130 (H) 65 - 99 mg/dL   Dg Swallowing Func-speech Pathology  01/31/2015    Objective Swallowing Evaluation:    Patient Details  Name: Timothy Lambert MRN: [id number] Date of Birth: Sep 13, 1956  Today's Date: 01/31/2015 Time: SLP Start Time (ACUTE ONLY): 1005-SLP Stop Time (ACUTE ONLY): 1026 SLP Time Calculation (min) (ACUTE ONLY): 21 min  Past Medical History:  Past Medical History  Diagnosis Date  . Coronary artery disease   . Hypertension   . Diabetes mellitus without complication    Past Surgical History:  Past Surgical History  Procedure Laterality Date  . Cardiac catheterization N/A 01/23/2015    Procedure: Left Heart Cath and Coronary Angiography;  Surgeon: [name], MD;  Location: [facility] INVASIVE CV LAB CUPID;  Service: Cardiovascular;   Laterality: N/A;  . Cardiac catheterization N/A 01/23/2015    Procedure: Coronary Stent Intervention;  Surgeon: [name], MD;   Location: [facility] INVASIVE CV LAB CUPID;  Service: Cardiovascular;  Laterality:  N/A;   HPI:  Other Pertinent Information: 59 y.o. M brought to [facility] 5/4 with  seizures. In ED, was altered and extremely agitated requiring intubation  for airway protection . EKG with STEMI. Taken urgently to cath and PTCI  to LAD. Extubated 5/6 with reintubation, extubated 5/9.  Pt has been NPO  since extubation due to acute reversible dysphagia.  Has pulled NG  multiple times.    No Data Recorded  Assessment / Plan / Recommendation CHL IP CLINICAL IMPRESSIONS 01/31/2015  Therapy Diagnosis Mild pharyngeal phase dysphagia  Clinical Impression Pt presents with a mild pharyngeal dysphagia c/b trace  aspiration of thin liquids with inconsistent cough response; mild  pharyngeal residue of solids post-swallow; delayed initiation.  Aspiration  likely secondary to continued impaired glottal closure s/p intubation.  Pt  is safe to initiate a dysphagia 3 diet with nectar-thick liquids; meds  crushed in puree.  He will require full supervision with  meals due to  encephalopathy (impulsivity, poor self-monitoring of rate/bolus size).   Wife present for study and participated in education re: recommendations,  precautions.  SLP will continue to follow.       CHL IP TREATMENT RECOMMENDATION 01/31/2015  Treatment Recommendations Therapy as outlined in treatment plan below     CHL IP DIET RECOMMENDATION 01/31/2015  SLP Diet Recommendations Dysphagia 3 (Mech soft);Nectar  Liquid Administration via (None)  Medication Administration Crushed with puree  Compensations Slow rate;Small sips/bites  Postural Changes and/or Swallow Maneuvers (None)     CHL IP OTHER RECOMMENDATIONS 01/31/2015  Recommended Consults (None)  Oral Care Recommendations Oral care BID  Other Recommendations Order thickener from pharmacy     CHL IP FOLLOW UP RECOMMENDATIONS 01/31/2015  Follow up Recommendations Inpatient Rehab     CHL IP FREQUENCY AND DURATION 01/31/2015  Speech Therapy Frequency (ACUTE ONLY) (None)  Treatment Duration 1 week     SLP Swallow Goals No flowsheet data found.  No flowsheet data found.    CHL IP REASON FOR REFERRAL 01/31/2015  Reason for Referral Objectively evaluate swallowing function     CHL IP ORAL PHASE 01/31/2015  Lips (None)  Tongue (None)  Mucous membranes (None)  Nutritional status (None)  Other (None)  Oxygen therapy (None)  Oral Phase WFL  Oral - Pudding Teaspoon (None)  Oral - Pudding Cup (None)  Oral - Honey Teaspoon (None)  Oral - Honey Cup (None)  Oral - Honey Syringe (None)  Oral - Nectar Teaspoon (None)  Oral - Nectar Cup (None)  Oral - Nectar Straw (None)  Oral - Nectar Syringe (None)  Oral - Ice Chips (None)  Oral - Thin Teaspoon (None)  Oral - Thin Cup (None)  Oral - Thin Straw (None)  Oral - Thin Syringe (None)  Oral - Puree (None)  Oral - Mechanical Soft (None)  Oral - Regular (None)  Oral - Multi-consistency (None)  Oral - Pill (None)  Oral Phase - Comment (None)      CHL IP PHARYNGEAL PHASE 01/31/2015  Pharyngeal Phase Impaired  Pharyngeal - Pudding  Teaspoon (None)  Penetration/Aspiration details (pudding teaspoon) (None)  Pharyngeal - Pudding Cup (None)  Penetration/Aspiration details (pudding cup) (None)  Pharyngeal - Honey Teaspoon (None)  Penetration/Aspiration details (honey teaspoon) (None)  Pharyngeal - Honey Cup (None)  Penetration/Aspiration details (honey cup) (None)  Pharyngeal - Honey Syringe (None)  Penetration/Aspiration details (honey syringe) (None)  Pharyngeal - Nectar Teaspoon (None)  Penetration/Aspiration details (nectar teaspoon) (None)  Pharyngeal - Nectar Cup (None)  Penetration/Aspiration details (nectar cup) (None)  Pharyngeal - Nectar Straw (None)  Penetration/Aspiration details (nectar straw) (None)  Pharyngeal - Nectar Syringe (None)  Penetration/Aspiration details (nectar syringe) (None)  Pharyngeal - Ice Chips (None)  Penetration/Aspiration details (ice chips) (None)  Pharyngeal - Thin Teaspoon (None)  Penetration/Aspiration details (thin teaspoon) (None)  Pharyngeal - Thin Cup (None)  Penetration/Aspiration details (thin cup) (None)  Pharyngeal - Thin Straw (None)  Penetration/Aspiration details (thin straw) (None)  Pharyngeal - Thin Syringe (None)  Penetration/Aspiration details (thin syringe') (None)  Pharyngeal - Puree (None)  Penetration/Aspiration details (puree) (None)  Pharyngeal - Mechanical Soft (None)  Penetration/Aspiration details (mechanical soft) (None)  Pharyngeal - Regular (None)  Penetration/Aspiration details (regular) (None)  Pharyngeal - Multi-consistency (None)  Penetration/Aspiration details (multi-consistency) (None)  Pharyngeal - Pill (None)  Penetration/Aspiration details (pill) (None)  Pharyngeal Comment (None)      No flowsheet data found.  No flowsheet data found.        Amanda L. Tivis Ringer, [place] CCC/SLP Pager [phone]  [name] 01/31/2015, 10:47 AM        Medical Problem List and Plan: 1. 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 Fitted with LifeVest 2.   DVT Prophylaxis/Anticoagulation: Aspirin/Brilinta as well as SCDs. Monitor for any signs of DVT 3. Pain Management: Tylenol as needed 4. Seizure disorder. Keppra 500 mg twice a day. EEG negative. 5. Neuropsych: This patient is capable of making decisions on his own behalf. 6. Skin/Wound Care: Routine skin checks 7. Fluids/Electrolytes/Nutrition: Routine I&O's with follow-up chemistries 8. Dysphagia. Dysphagia 3 nectar liquids. Follow-up speech therapy 9. Hypertension. Coreg 25 mg twice a day, lisinopril 10 mg daily. Monitor with increased mobility 10. Diabetes mellitus and peripheral neuropathy. Hemoglobin A1c 9.3. Lantus insulin 14 units daily. Check blood sugars before meals and at bedtime. 11. Alcohol abuse. Monitor for any signs of withdrawal provide counseling 12. Hyperlipidemia. Lipitor   Post Admission Physician Evaluation: 1. Functional deficits secondary  to embolic CVA  2. Patient is admitted to receive collaborative, interdisciplinary care between the physiatrist, rehab nursing staff, and therapy team. 3. Patient's level of medical complexity and substantial therapy needs in context of that medical necessity cannot be provided at a lesser intensity of care such as a SNF. 4. Patient has experienced substantial functional loss from his/her baseline which was documented above under the "Functional History" and "Functional Status" headings.  Judging by the patient's diagnosis, physical exam, and functional history, the patient has potential for functional progress which will result in measurable gains  while on inpatient rehab.  These gains will be of substantial and practical use upon discharge  in facilitating mobility and self-care at the household level. 5. Physiatrist will provide 24 hour management of medical needs as well as oversight of the therapy plan/treatment and provide guidance as appropriate regarding the interaction of the two. 6. 24 hour rehab nursing will assist with  bladder management, bowel management, safety, skin/wound care, disease management, medication administration, pain management and patient education  and help integrate therapy concepts, techniques,education, etc. 7. PT will assess and treat for/with: Lower extremity strength, range of motion, stamina, balance, functional mobility, safety, adaptive techniques and equipment, NMR, cognitive perceptual awareness, stroke education, .   Goals are: supervision. 8. OT will assess and treat for/with: ADL's, functional mobility, safety, upper extremity strength, adaptive techniques and equipment, NMR, cognitive perceptual awareness, family education.   Goals are: supervision. Therapy may not yet proceed with showering this patient. 9. SLP will assess and treat for/with: cognition, swallowing, communication.  Goals are: supervision to min assist. 10. Case Management and Social Worker will assess and treat for psychological issues and discharge planning. 11. Team conference will be held weekly to assess progress toward goals and to determine barriers to discharge. 12. Patient will receive at least 3 hours of therapy per day at least 5 days per week. 13. ELOS: 16-22 days       14. Prognosis:  good     Meredith Staggers, MD, [place] [facility] 02/01/2015   02/01/2015

## 2015-02-01 NOTE — Progress Notes (Signed)
Speech Language Pathology Treatment: Dysphagia  Patient Details Name: Timothy Lambert MRN: [id number] DOB: 1955-12-20 Today's Date: 02/01/2015 Time: 2595-6387 SLP Time Calculation (min) (ACUTE ONLY): 22 min  Assessment / Plan / Recommendation Clinical Impression  F/u for dysphagia: Pt with occasional delayed cough after consumption of nectar-thick liquids.  His wife reports that he tolerated yesterday's meals and bfast this am with no coughing.  He remains with breathy, dysphonic voice. Pt required min verbal cues to reduce rate, bolus size, which improved toleration.  Still with inconsistent recall of precautions.  Instructed his wife how to thicken liquids to nectar consistency; she was able to return demonstrate.  Pt preparing for D/C to CIR today.  Acute care goal met - he will need continued tx on CIR; pt and wife in agreement.  When it is time for repeated instrumental swallow study, he may benefit from FEES for visualization of vocal cords.   HPI Other Pertinent Information: 59 y.o. M brought to [facility] ED 5/4 with seizures. In ED, was altered and extremely agitated requiring intubation for airway protection . EKG with STEMI. Taken urgently to cath and PTCI to LAD. Extubated 5/6 with reintubation, extubated 5/9.  Pt has been NPO since extubation due to acute reversible dysphagia.  Has pulled NG multiple times.     Pertinent Vitals Pain Assessment: No/denies pain  SLP Plan  Discharge SLP treatment due to (comment) (D/C to CIR)    Recommendations Diet recommendations: Dysphagia 3 (mechanical soft);Nectar-thick liquid Liquids provided via: Cup Medication Administration: Crushed with puree Supervision: Full supervision/cueing for compensatory strategies Compensations: Slow rate;Small sips/bites Postural Changes and/or Swallow Maneuvers: Out of bed for meals              Oral Care Recommendations: Oral care BID Plan: Discharge SLP treatment due to (comment) (D/C to CIR)   Timothy Lambert L.  Timothy Lambert, [place] CCC/SLP Pager [phone]      Timothy Lambert 02/01/2015, 11:29 AM

## 2015-02-01 NOTE — H&P (Signed)
Physical Medicine and Rehabilitation Admission H&P    Chief Complaint  Patient presents with  . Seizures  . Hypertension  . Hyperglycemia  : Timothy Lambert is a 59 y.o. right handed male with history of hypertension, alcohol abuse, diabetes mellitus and coronary artery disease maintained on aspirin. Patient lives with his wife and independent up until a few weeks ago. Presented to [facility] 01/23/2015 with seizures. Extremely agitated requiring intubation for airway protection. Alcohol level and drug screen negative. CT of the head negative for acute changes. EKG with precordial ST changes concerning for ischemia/infarct. Transferred to [facility] for ongoing care. After arriving to [facility] repeat EKG with worsened ST elevations and T-wave inversions troponin greater than 65. Also noted blood pressure in the high 200s over 100s and glucose in the 400s. Underwent emergent cardiac catheterization findings of 99% LAD stenosis with stenting. He was also fitted with a life vest. Echocardiogram with ejection fraction 09% grade 1 diastolic dysfunction. MRI 01/24/2015 showing multiple small foci of acute ischemia spanning multiple vascular territories consistent with embolic event. EEG was no seizure activity noted. Generalized low voltage slowing indicating mild to moderate cerebral disturbance encephalopathy. Carotid Dopplers with no ICA stenosis. Neurology follow-up currently maintained on aspirin therapy as well as Brilinta. Nasogastric tube had been in place for nutritional support with modified barium swallow 01/31/2015 and placed on a mechanical soft nectar thick liquid diet. Physical therapy evaluation completed 01/29/2015 with recommendations of physical medicine rehabilitation consult. Patient was admitted for a comprehensive rehabilitation program   ROS Review of Systems  Unable to perform ROS: mental acuity    Past Medical History  Diagnosis Date  . Coronary artery  disease   . Hypertension   . Diabetes mellitus without complication    Past Surgical History  Procedure Laterality Date  . Cardiac catheterization N/A 01/23/2015    Procedure: Left Heart Cath and Coronary Angiography;  Surgeon: [name], MD;  Location: [facility] INVASIVE CV LAB CUPID;  Service: Cardiovascular;  Laterality: N/A;  . Cardiac catheterization N/A 01/23/2015    Procedure: Coronary Stent Intervention;  Surgeon: [name], MD;  Location: [facility] INVASIVE CV LAB CUPID;  Service: Cardiovascular;  Laterality: N/A;   History reviewed. No pertinent family history. Social History:  reports that he has quit smoking. His smoking use included Cigarettes. He has never used smokeless tobacco. He reports that he drinks about 16.8 oz of alcohol per week. He reports that he does not use illicit drugs. Allergies: No Known Allergies Medications Prior to Admission  Medication Sig Dispense Refill  . aspirin 325 MG tablet Take 325 mg by mouth every 6 (six) hours as needed for mild pain or moderate pain.      Home: Home Living Family/patient expects to be discharged to:: Private residence Living Arrangements: Spouse/significant other Type of Home: House Home Access: Stairs to enter Technical brewer of Steps: 4 Home Layout: Multi-level Alternate Level Stairs-Number of Steps: 7 Home Equipment: Environmental consultant - 2 wheels, [place] - single point   Functional History: Prior Function Level of Independence: Independent Comments: pt works full time at a [employer] Status:  Mobility: Bed Mobility Overal bed mobility: Needs Assistance Bed Mobility: Rolling, Sidelying to Sit Rolling: Mod assist Sidelying to sit: Mod assist General bed mobility comments: cues for sequence with delayed processing and hand over hand assist to reach for rail. Assist to elevate trunk and bring legs off of bed Transfers Overall transfer level: Needs assistance Transfers: Sit  to/from Stand, Risk manager Sit  to Stand: Min assist Stand pivot transfers: Mod assist General transfer comment: pt stood well from bed with cues but legs buckling with pivoting to chair with assist to control pelvis with pivot and descent to chair      ADL:    Cognition: Cognition Overall Cognitive Status: Impaired/Different from baseline Orientation Level: Oriented to person, Oriented to place, Oriented to time, Disoriented to situation Cognition Arousal/Alertness: Lethargic Behavior During Therapy: Flat affect Overall Cognitive Status: Impaired/Different from baseline Area of Impairment: Problem solving Problem Solving: Slow processing General Comments: pt with accurate orientation but slow processing  Physical Exam: Blood pressure 165/87, pulse 88, temperature 98 F (36.7 C), temperature source Oral, resp. rate 11, height _0  (1.676 m), weight 64.2 kg (141 lb 8.6 oz), SpO2 98 %. Physical Exam HENT:  Head: Normocephalic.  Eyes:  Pupil sluggish to light without nystagmus  Neck: Normal range of motion. Neck supple. No thyromegaly present.  Dysphonic voice  Cardiovascular: Normal rate and regular rhythm.  Respiratory: Effort normal and breath sounds normal. No respiratory distress.  GI: Soft. Bowel sounds are normal. He exhibits no distension.  Neurological:  Patient is alert, less restless and impulsive. Wife is at bedside.  He was able to provide his name and age as well as place. Limited awareness of his deficits. Followed simple commands. Moves all 4's with at least 4/5 strength proximal to distal in upper/lower limbs.  Skin: Skin is warm and dry.  Psychiatric:  Pleasant and cooperative   Results for orders placed or performed during the hospital encounter of 01/22/15 (from the past 48 hour(s))  Glucose, capillary     Status: Abnormal   Collection Time: 01/30/15  5:02 AM  Result Value Ref Range   Glucose-Capillary 149 (H) 70 - 99 mg/dL  Glucose, capillary     Status: Abnormal   Collection  Time: 01/30/15  8:25 AM  Result Value Ref Range   Glucose-Capillary 201 (H) 70 - 99 mg/dL  Glucose, capillary     Status: Abnormal   Collection Time: 01/30/15 12:39 PM  Result Value Ref Range   Glucose-Capillary 252 (H) 70 - 99 mg/dL  Glucose, capillary     Status: Abnormal   Collection Time: 01/30/15  5:05 PM  Result Value Ref Range   Glucose-Capillary 155 (H) 70 - 99 mg/dL  Glucose, capillary     Status: Abnormal   Collection Time: 01/30/15  8:05 PM  Result Value Ref Range   Glucose-Capillary 150 (H) 70 - 99 mg/dL  Glucose, capillary     Status: Abnormal   Collection Time: 01/30/15 11:55 PM  Result Value Ref Range   Glucose-Capillary 170 (H) 70 - 99 mg/dL  CBC     Status: Abnormal   Collection Time: 01/31/15  2:53 AM  Result Value Ref Range   WBC 11.6 (H) 4.0 - 10.5 K/uL   RBC 4.36 4.22 - 5.81 MIL/uL   Hemoglobin 13.2 13.0 - 17.0 g/dL   HCT 38.4 (L) 39.0 - 52.0 %   MCV 88.1 78.0 - 100.0 fL   MCH 30.3 26.0 - 34.0 pg   MCHC 34.4 30.0 - 36.0 g/dL   RDW 12.9 11.5 - 15.5 %   Platelets 384 150 - 400 K/uL  Comprehensive metabolic panel     Status: Abnormal   Collection Time: 01/31/15  2:53 AM  Result Value Ref Range   Sodium 148 (H) 135 - 145 mmol/L   Potassium 3.4 (L) 3.5 -  5.1 mmol/L   Chloride 110 101 - 111 mmol/L   CO2 25 22 - 32 mmol/L   Glucose, Bld 105 (H) 65 - 99 mg/dL   BUN 43 (H) 6 - 20 mg/dL   Creatinine, Ser 1.48 (H) 0.61 - 1.24 mg/dL   Calcium 9.7 8.9 - 10.3 mg/dL   Total Protein 8.2 (H) 6.5 - 8.1 g/dL   Albumin 2.9 (L) 3.5 - 5.0 g/dL   AST 34 15 - 41 U/L   ALT 36 17 - 63 U/L   Alkaline Phosphatase 71 38 - 126 U/L   Total Bilirubin 0.5 0.3 - 1.2 mg/dL   GFR calc non Af Amer 50 (L) >60 mL/min   GFR calc Af Amer 58 (L) >60 mL/min    Comment: (NOTE) The eGFR has been calculated using the CKD EPI equation. This calculation has not been validated in all clinical situations. eGFR's persistently <60 mL/min signify possible Chronic Kidney Disease.    Anion gap  13 5 - 15  Vitamin B12     Status: Abnormal   Collection Time: 01/31/15  2:53 AM  Result Value Ref Range   Vitamin B-12 1126 (H) 180 - 914 pg/mL    Comment: (NOTE) This assay is not validated for testing neonatal or myeloproliferative syndrome specimens for Vitamin B12 levels.   Folate     Status: None   Collection Time: 01/31/15  2:53 AM  Result Value Ref Range   Folate 22.2 >5.9 ng/mL  Iron and TIBC     Status: Abnormal   Collection Time: 01/31/15  2:53 AM  Result Value Ref Range   Iron 38 (L) 45 - 182 ug/dL   TIBC 224 (L) 250 - 450 ug/dL   Saturation Ratios 17 (L) 17.9 - 39.5 %   UIBC 186 ug/dL  Ferritin     Status: Abnormal   Collection Time: 01/31/15  2:53 AM  Result Value Ref Range   Ferritin 1254 (H) 24 - 336 ng/mL  Reticulocytes     Status: None   Collection Time: 01/31/15  2:53 AM  Result Value Ref Range   Retic Ct Pct 1.0 0.4 - 3.1 %   RBC. 4.27 4.22 - 5.81 MIL/uL   Retic Count, Manual 42.7 19.0 - 186.0 K/uL  Glucose, capillary     Status: Abnormal   Collection Time: 01/31/15  4:32 AM  Result Value Ref Range   Glucose-Capillary 108 (H) 65 - 99 mg/dL  Glucose, capillary     Status: Abnormal   Collection Time: 01/31/15  7:53 AM  Result Value Ref Range   Glucose-Capillary 137 (H) 65 - 99 mg/dL  Glucose, capillary     Status: Abnormal   Collection Time: 01/31/15  1:19 PM  Result Value Ref Range   Glucose-Capillary 197 (H) 65 - 99 mg/dL  Glucose, capillary     Status: Abnormal   Collection Time: 01/31/15  4:39 PM  Result Value Ref Range   Glucose-Capillary 195 (H) 65 - 99 mg/dL  Glucose, capillary     Status: Abnormal   Collection Time: 01/31/15  8:08 PM  Result Value Ref Range   Glucose-Capillary 320 (H) 65 - 99 mg/dL  Glucose, capillary     Status: Abnormal   Collection Time: 02/01/15 12:13 AM  Result Value Ref Range   Glucose-Capillary 218 (H) 65 - 99 mg/dL  Glucose, capillary     Status: Abnormal   Collection Time: 02/01/15  3:40 AM  Result Value Ref  Range   Glucose-Capillary  130 (H) 65 - 99 mg/dL   Dg Swallowing Func-speech Pathology  01/31/2015    Objective Swallowing Evaluation:    Patient Details  Name: Timothy Lambert MRN: [id number] Date of Birth: Sep 13, 1956  Today's Date: 01/31/2015 Time: SLP Start Time (ACUTE ONLY): 1005-SLP Stop Time (ACUTE ONLY): 1026 SLP Time Calculation (min) (ACUTE ONLY): 21 min  Past Medical History:  Past Medical History  Diagnosis Date  . Coronary artery disease   . Hypertension   . Diabetes mellitus without complication    Past Surgical History:  Past Surgical History  Procedure Laterality Date  . Cardiac catheterization N/A 01/23/2015    Procedure: Left Heart Cath and Coronary Angiography;  Surgeon: [name], MD;  Location: [facility] INVASIVE CV LAB CUPID;  Service: Cardiovascular;   Laterality: N/A;  . Cardiac catheterization N/A 01/23/2015    Procedure: Coronary Stent Intervention;  Surgeon: [name], MD;   Location: [facility] INVASIVE CV LAB CUPID;  Service: Cardiovascular;  Laterality:  N/A;   HPI:  Other Pertinent Information: 59 y.o. M brought to [facility] ED 5/4 with  seizures. In ED, was altered and extremely agitated requiring intubation  for airway protection . EKG with STEMI. Taken urgently to cath and PTCI  to LAD. Extubated 5/6 with reintubation, extubated 5/9.  Pt has been NPO  since extubation due to acute reversible dysphagia.  Has pulled NG  multiple times.    No Data Recorded  Assessment / Plan / Recommendation CHL IP CLINICAL IMPRESSIONS 01/31/2015  Therapy Diagnosis Mild pharyngeal phase dysphagia  Clinical Impression Pt presents with a mild pharyngeal dysphagia c/b trace  aspiration of thin liquids with inconsistent cough response; mild  pharyngeal residue of solids post-swallow; delayed initiation.  Aspiration  likely secondary to continued impaired glottal closure s/p intubation.  Pt  is safe to initiate a dysphagia 3 diet with nectar-thick liquids; meds  crushed in puree.  He will require full supervision with  meals due to  encephalopathy (impulsivity, poor self-monitoring of rate/bolus size).   Wife present for study and participated in education re: recommendations,  precautions.  SLP will continue to follow.       CHL IP TREATMENT RECOMMENDATION 01/31/2015  Treatment Recommendations Therapy as outlined in treatment plan below     CHL IP DIET RECOMMENDATION 01/31/2015  SLP Diet Recommendations Dysphagia 3 (Mech soft);Nectar  Liquid Administration via (None)  Medication Administration Crushed with puree  Compensations Slow rate;Small sips/bites  Postural Changes and/or Swallow Maneuvers (None)     CHL IP OTHER RECOMMENDATIONS 01/31/2015  Recommended Consults (None)  Oral Care Recommendations Oral care BID  Other Recommendations Order thickener from pharmacy     CHL IP FOLLOW UP RECOMMENDATIONS 01/31/2015  Follow up Recommendations Inpatient Rehab     CHL IP FREQUENCY AND DURATION 01/31/2015  Speech Therapy Frequency (ACUTE ONLY) (None)  Treatment Duration 1 week     SLP Swallow Goals No flowsheet data found.  No flowsheet data found.    CHL IP REASON FOR REFERRAL 01/31/2015  Reason for Referral Objectively evaluate swallowing function     CHL IP ORAL PHASE 01/31/2015  Lips (None)  Tongue (None)  Mucous membranes (None)  Nutritional status (None)  Other (None)  Oxygen therapy (None)  Oral Phase WFL  Oral - Pudding Teaspoon (None)  Oral - Pudding Cup (None)  Oral - Honey Teaspoon (None)  Oral - Honey Cup (None)  Oral - Honey Syringe (None)  Oral - Nectar Teaspoon (None)  Oral - Nectar Cup (None)  Oral - Nectar Straw (None)  Oral - Nectar Syringe (None)  Oral - Ice Chips (None)  Oral - Thin Teaspoon (None)  Oral - Thin Cup (None)  Oral - Thin Straw (None)  Oral - Thin Syringe (None)  Oral - Puree (None)  Oral - Mechanical Soft (None)  Oral - Regular (None)  Oral - Multi-consistency (None)  Oral - Pill (None)  Oral Phase - Comment (None)      CHL IP PHARYNGEAL PHASE 01/31/2015  Pharyngeal Phase Impaired  Pharyngeal - Pudding  Teaspoon (None)  Penetration/Aspiration details (pudding teaspoon) (None)  Pharyngeal - Pudding Cup (None)  Penetration/Aspiration details (pudding cup) (None)  Pharyngeal - Honey Teaspoon (None)  Penetration/Aspiration details (honey teaspoon) (None)  Pharyngeal - Honey Cup (None)  Penetration/Aspiration details (honey cup) (None)  Pharyngeal - Honey Syringe (None)  Penetration/Aspiration details (honey syringe) (None)  Pharyngeal - Nectar Teaspoon (None)  Penetration/Aspiration details (nectar teaspoon) (None)  Pharyngeal - Nectar Cup (None)  Penetration/Aspiration details (nectar cup) (None)  Pharyngeal - Nectar Straw (None)  Penetration/Aspiration details (nectar straw) (None)  Pharyngeal - Nectar Syringe (None)  Penetration/Aspiration details (nectar syringe) (None)  Pharyngeal - Ice Chips (None)  Penetration/Aspiration details (ice chips) (None)  Pharyngeal - Thin Teaspoon (None)  Penetration/Aspiration details (thin teaspoon) (None)  Pharyngeal - Thin Cup (None)  Penetration/Aspiration details (thin cup) (None)  Pharyngeal - Thin Straw (None)  Penetration/Aspiration details (thin straw) (None)  Pharyngeal - Thin Syringe (None)  Penetration/Aspiration details (thin syringe') (None)  Pharyngeal - Puree (None)  Penetration/Aspiration details (puree) (None)  Pharyngeal - Mechanical Soft (None)  Penetration/Aspiration details (mechanical soft) (None)  Pharyngeal - Regular (None)  Penetration/Aspiration details (regular) (None)  Pharyngeal - Multi-consistency (None)  Penetration/Aspiration details (multi-consistency) (None)  Pharyngeal - Pill (None)  Penetration/Aspiration details (pill) (None)  Pharyngeal Comment (None)      No flowsheet data found.  No flowsheet data found.        Amanda L. Tivis Ringer, [place] CCC/SLP Pager [phone]  [name] 01/31/2015, 10:47 AM        Medical Problem List and Plan: 1. 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 Fitted with LifeVest 2.   DVT Prophylaxis/Anticoagulation: Aspirin/Brilinta as well as SCDs. Monitor for any signs of DVT 3. Pain Management: Tylenol as needed 4. Seizure disorder. Keppra 500 mg twice a day. EEG negative. 5. Neuropsych: This patient is capable of making decisions on his own behalf. 6. Skin/Wound Care: Routine skin checks 7. Fluids/Electrolytes/Nutrition: Routine I&O's with follow-up chemistries 8. Dysphagia. Dysphagia 3 nectar liquids. Follow-up speech therapy 9. Hypertension. Coreg 25 mg twice a day, lisinopril 10 mg daily. Monitor with increased mobility 10. Diabetes mellitus and peripheral neuropathy. Hemoglobin A1c 9.3. Lantus insulin 14 units daily. Check blood sugars before meals and at bedtime. 11. Alcohol abuse. Monitor for any signs of withdrawal provide counseling 12. Hyperlipidemia. Lipitor   Post Admission Physician Evaluation: 1. Functional deficits secondary  to embolic CVA  2. Patient is admitted to receive collaborative, interdisciplinary care between the physiatrist, rehab nursing staff, and therapy team. 3. Patient's level of medical complexity and substantial therapy needs in context of that medical necessity cannot be provided at a lesser intensity of care such as a SNF. 4. Patient has experienced substantial functional loss from his/her baseline which was documented above under the "Functional History" and "Functional Status" headings.  Judging by the patient's diagnosis, physical exam, and functional history, the patient has potential for functional progress which will result in measurable gains  while on inpatient rehab.  These gains will be of substantial and practical use upon discharge  in facilitating mobility and self-care at the household level. 5. Physiatrist will provide 24 hour management of medical needs as well as oversight of the therapy plan/treatment and provide guidance as appropriate regarding the interaction of the two. 6. 24 hour rehab nursing will assist with  bladder management, bowel management, safety, skin/wound care, disease management, medication administration, pain management and patient education  and help integrate therapy concepts, techniques,education, etc. 7. PT will assess and treat for/with: Lower extremity strength, range of motion, stamina, balance, functional mobility, safety, adaptive techniques and equipment, NMR, cognitive perceptual awareness, stroke education, .   Goals are: supervision. 8. OT will assess and treat for/with: ADL's, functional mobility, safety, upper extremity strength, adaptive techniques and equipment, NMR, cognitive perceptual awareness, family education.   Goals are: supervision. Therapy may not yet proceed with showering this patient. 9. SLP will assess and treat for/with: cognition, swallowing, communication.  Goals are: supervision to min assist. 10. Case Management and Social Worker will assess and treat for psychological issues and discharge planning. 11. Team conference will be held weekly to assess progress toward goals and to determine barriers to discharge. 12. Patient will receive at least 3 hours of therapy per day at least 5 days per week. 13. ELOS: 16-22 days       14. Prognosis:  good     Meredith Staggers, MD, [place] [facility] 02/01/2015   02/01/2015

## 2015-02-01 NOTE — Discharge Summary (Signed)
DISCHARGE SUMMARY  JI FILA  MR#: [id number]  DOB:1955/10/27  Date of Admission: 01/22/2015 Date of Discharge: 02/01/2015  Attending Physician:MCCLUNG,JEFFREY T  Patient's PCP:No primary care provider on file.  Consults: [facility] Cardiology  Neurology  Disposition: D/C to CIR   Follow-up Appts:     Follow-up Information    Follow up with Murray Hodgkins, NP On 02/08/2015.   Specialties:  Nurse Practitioner, Cardiology, Radiology   Why:  9:30am. 7 day transition of care followup.   Contact information:   [id number] [place] [address] [phone]       Follow up with Will need assistance finding PCP prior to D/C from CIR .     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 M brought to [facility] ED 5/4 with seizures. In ED he was altered and extremely agitated requiring intubation for airway protection. EKG noted STEMI. Taken urgently to cath and PTCI to LAD. Back to ICU on ventilator.  Hospital Course:  Significant Events: 5/4 - admitted with seizures, intubated for airway protection and further workup, EKG findings concerning for ischemia. Transferred to [facility] ICU for further evaluation and management. 5/5 extubated > flash edema > promptly reintubated  Hypertensive emergency SBP 230 on EMS arrival - blood pressure remains elevated above goal but has improved overall - will need ongoing medication titration over the coming  weeks to achieve goal   Acute metabolic encephalopathy  Multi-factorial w/ elements noted below - MS much improved at time of d/c to CIR w/ pt now interactive and alert though not yet fully oriented   Seizure No prior history of seizure disorder - suspected to be related to alcohol withdrawal - cont keppra for now - may be candidate for d/c of this med in outpt f/u   Acute scattered multiple CVAs Pattern suggestive of embolic process - no gross evidence of cardiac thrombus on TTE - not felt to be a candidate for systemic anticoagulation given his EtOH abuse   Dysphagia Cleared for diet per SLP   Anterior STEMI Status post PTCI to 99% mid/distal LAD lesion with multiple other areas of disease appreciated - LifeVest has been arranged by Cardiology and will be fitted by [employer] rep prior to discharge to CIR - to cont Coreg, ASA, Brilinta   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 via TTE 01/24/15 - euvolemic at time of d/c - cont BB and ACE   Newly appreciated Paroxysmal atrial flutter Per cardiology - rate controlled - CHA2-DS2-VAsc score 4 (HTN, HF, stroke) - not candidate for systemic anticoagulation  VDRF following GTC seizure, acute MI, multiple embolicstrokes Resolved/extubated  Acute kidney injury Baseline creatinine unknown - Cr stable at ~1.5 at time of d/c   Hypokalemia Replaced to normal range  DM Does not appear to be on outpatient medications - CBGs well controlled at time of d/c     Medication List    STOP taking these medications        aspirin 325 MG tablet  Replaced by:  aspirin 81  MG chewable tablet      TAKE these medications        acetaminophen 325 MG tablet  Commonly known as:  TYLENOL  Take 2 tablets (650 mg total) by mouth every 4 (four) hours as needed for mild pain (temp > 101.5).     aspirin 81 MG chewable tablet  Chew 1 tablet (81 mg total) by mouth daily.  Start taking on:  02/02/2015      atorvastatin 80 MG tablet  Commonly known as:  LIPITOR  Take 1 tablet (80 mg total) by mouth daily at 6 PM.     carvedilol 25 MG tablet  Commonly known as:  COREG  Take 1 tablet (25 mg total) by mouth 2 (two) times daily with a meal.     docusate 50 MG/5ML liquid  Commonly known as:  COLACE  Take 10 mLs (100 mg total) by mouth 2 (two) times daily as needed for mild constipation.     famotidine 20 MG tablet  Commonly known as:  PEPCID  Take 1 tablet (20 mg total) by mouth 2 (two) times daily.     folic acid 1 MG tablet  Commonly known as:  FOLVITE  Take 1 tablet (1 mg total) by mouth daily.  Start taking on:  02/02/2015     furosemide 20 MG tablet  Commonly known as:  LASIX  Take 1 tablet (20 mg total) by mouth daily.     insulin aspart 100 UNIT/ML injection  Commonly known as:  novoLOG  Inject 0-15 Units into the skin 3 (three) times daily with meals.     insulin glargine 100 UNIT/ML injection  Commonly known as:  LANTUS  Inject 0.14 mLs (14 Units total) into the skin daily.     levETIRAcetam 100 MG/ML solution  Commonly known as:  KEPPRA  Take 5 mLs (500 mg total) by mouth every 12 (twelve) hours.     lisinopril 10 MG tablet  Commonly known as:  PRINIVIL,ZESTRIL  Take 1 tablet (10 mg total) by mouth daily.  Start taking on:  02/02/2015     [place]  Take 1 Container by mouth as needed.     thiamine 100 MG tablet  Take 1 tablet (100 mg total) by mouth daily.  Start taking on:  02/02/2015     ticagrelor 90 MG Tabs tablet  Commonly known as:  BRILINTA  Take 1 tablet (90 mg total) by mouth 2 (two) times daily.        Day of Discharge BP 152/77 mmHg  Pulse 84  Temp(Src) 98.9 F (37.2 C) (Oral)  Resp 16  Ht 5\' 6"  (1.676 m)  Wt 64.2 kg (141 lb 8.6 oz)  BMI 22.86 kg/m2  SpO2 92%  Physical Exam: General: No acute respiratory distress Lungs: Clear to auscultation bilaterally without wheezes or crackles Cardiovascular: Regular rate and  rhythm without murmur gallop or rub normal S1 and S2 Abdomen: Nontender, nondistended, soft, bowel sounds positive, no rebound, no ascites, no appreciable mass Extremities: No significant cyanosis, clubbing, or edema bilateral lower extremities  Basic Metabolic Panel:  Recent Labs Lab 01/26/15 0209 01/27/15 0453 01/28/15 0428 01/29/15 0430 01/30/15 0315 01/31/15 0253  NA 140 139 141 141 148* 148*  K 3.9 3.6 3.4* 3.1* 4.0 3.4*  CL 107 101 101 104 108 110  CO2 23 26 28 25 25 25   GLUCOSE 169* 274* 209* 106* 140* 105*  BUN 28* 38* 47* 42* 43* 43*  CREATININE 1.81* 1.81*  1.83* 1.47* 1.37* 1.48*  CALCIUM 8.6* 8.4* 8.6* 8.5* 9.8 9.7  MG 1.7 1.7 1.8 1.7 2.5*  --   PHOS 3.9 4.0 4.4 3.9 3.9  --     Liver Function Tests:  Recent Labs Lab 01/31/15 0253  AST 34  ALT 36  ALKPHOS 71  BILITOT 0.5  PROT 8.2*  ALBUMIN 2.9*   CBC:  Recent Labs Lab 01/27/15 0453 01/28/15 0428 01/29/15 0430 01/30/15 0315 01/31/15 0253  WBC 10.4 10.6* 10.9* 12.7* 11.6*  HGB 10.4* 10.4* 10.9* 12.4* 13.2  HCT 30.0* 30.0* 31.7* 35.3* 38.4*  MCV 88.5 87.7 87.3 87.8 88.1  PLT 217 228 258 342 384   CBG:  Recent Labs Lab 01/31/15 1639 01/31/15 2008 02/01/15 0013 02/01/15 0340 02/01/15 0757  GLUCAP 195* 320* 218* 130* 113*    Recent Results (from the past 240 hour(s))  MRSA PCR Screening     Status: None   Collection Time: 01/23/15  4:24 AM  Result Value Ref Range Status   MRSA by PCR NEGATIVE NEGATIVE Final    Comment:        The GeneXpert MRSA Assay (FDA approved for NASAL specimens only), is one component of a comprehensive MRSA colonization surveillance program. It is not intended to diagnose MRSA infection nor to guide or monitor treatment for MRSA infections.       Time spent in discharge (includes decision making & examination of pt): >30 minutes  02/01/2015, 11:11 AM   Cherene Altes, MD [facility] Office  [phone] Pager [phone]  On-Call/Text  Page:      [email or web address]      password [email or web address]

## 2015-02-01 NOTE — Progress Notes (Signed)
Timothy Diones, RN Rehab Admission Coordinator Signed Physical Medicine and Rehabilitation PMR Pre-admission 02/01/2015 12:10 PM  Related encounter: ED to Hosp-Admission (Current) from 01/22/2015 in [facility]   PMR Admission Coordinator Pre-Admission Assessment  Patient: Timothy Lambert is an 59 y.o., male MRN: [id number] DOB: 1955-11-20 Height: 5\' 6"  (167.6 cm) Weight: 64.2 kg (141 lb 8.6 oz)  Insurance Information Self pay - no insurance. Wife reports that she could not afford to cover patient on her insurance here at [facility] Application Date: Case Manager:  Disability Application Date: Case Worker:   Emergency [place]    Name Relation Home Work Mobile   [place] Spouse [phone]  [phone]   Lambert,Timothy Sister [phone]        Current Medical History  Patient Admitting Diagnosis: Embolic CVA  History of Present Illness: A 59 y.o. right handed male with history of hypertension, alcohol abuse, diabetes mellitus and coronary artery disease maintained on aspirin. Patient lives with his wife and independent up until a few weeks ago. Presented to [facility] 01/23/2015 with seizures. Extremely agitated requiring intubation for airway protection. Alcohol level and drug screen negative. CT of the head negative for acute changes. EKG with precordial ST changes concerning for ischemia/infarct. Transferred to [facility] for ongoing care. After arriving to [facility] repeat EKG with worsened ST elevations and T-wave inversions troponin greater than 65. Also noted blood pressure in the high 200s over 100s and glucose in the 400s. Underwent emergent cardiac catheterization findings of 99% LAD  stenosis with stenting. Echocardiogram with ejection fraction [id number] grade 1 diastolic dysfunction. MRI 01/24/2015 showing multiple small foci of acute ischemia spanning multiple vascular territories consistent with embolic event. EEG was no seizure activity noted. Generalized low voltage slowing indicating mild to moderate cerebral disturbance encephalopathy. Carotid Dopplers with no ICA stenosis. Neurology follow-up currently maintained on aspirin therapy as well as Brilinta. Nasogastric tube had been in place for nutritional support with modified barium swallow 01/31/2015 and placed on a mechanical soft nectar thick liquid diet. Physical therapy evaluation completed 01/29/2015 with recommendations of physical medicine rehabilitation consult. Patient to be admitted for a comprehensive inpatient rehabilitation program.   Past Medical History  Past Medical History  Diagnosis Date  . Coronary artery disease   . Hypertension   . Diabetes mellitus without complication     Family History  family history is not on file.  Prior Rehab/Hospitalizations: No previous rehab admissions.  Current Medications   Current facility-administered medications:  . acetaminophen (TYLENOL) suppository 650 mg, 650 mg, Rectal, Q4H PRN, Timothy Altes, MD . acetaminophen (TYLENOL) tablet 650 mg, 650 mg, Oral, Q4H PRN, Timothy Altes, MD . antiseptic oral rinse (CPC / CETYLPYRIDINIUM CHLORIDE 0.05%) solution 7 mL, 7 mL, Mouth Rinse, QID, Timothy Mires, MD, 7 mL at 02/01/15 0028 . [START ON 02/02/2015] aspirin chewable tablet 81 mg, 81 mg, Oral, Daily, Timothy Altes, MD . atorvastatin (LIPITOR) tablet 80 mg, 80 mg, Oral, q1800, Timothy Altes, MD . carvedilol (COREG) tablet 25 mg, 25 mg, Oral, BID WC, Almyra Deforest, [place], 25 mg at 02/01/15 0654 . chlorhexidine (PERIDEX) 0.12 % solution 15 mL, 15 mL, Mouth Rinse, BID, Timothy Mires, MD, 15 mL at 02/01/15 0944 . docusate (COLACE) 50 MG/5ML  liquid 100 mg, 100 mg, Oral, BID PRN, Timothy Altes, MD . famotidine (PEPCID) tablet 20 mg, 20 mg, Oral, BID, Timothy Lambert  Thereasa Solo, MD . Derrill Memo ON [id number] folic acid (FOLVITE) tablet 1 mg, 1 mg, Oral, Daily, Timothy Altes, MD . furosemide (LASIX) tablet 20 mg, 20 mg, Oral, Daily, Timothy Altes, MD, 20 mg at 02/01/15 1157 . insulin aspart (novoLOG) injection 0-15 Units, 0-15 Units, Subcutaneous, TID WC, Timothy Altes, MD . insulin glargine (LANTUS) injection 14 Units, 14 Units, Subcutaneous, Daily, Timothy Altes, MD, 14 Units at 02/01/15 [phone] . levETIRAcetam (KEPPRA) 100 MG/ML solution 500 mg, 500 mg, Oral, Q12H, 500 mg at 02/01/15 0944 **OR** [DISCONTINUED] levETIRAcetam (KEPPRA) 500 mg in sodium chloride 0.9 % 100 mL IVPB, 500 mg, Intravenous, Q12H, Timothy Altes, MD, 500 mg at 01/31/15 0931 . [START ON 02/02/2015] lisinopril (PRINIVIL,ZESTRIL) tablet 10 mg, 10 mg, Oral, Daily, Timothy Altes, MD . RESOURCE THICKENUP CLEAR, , Oral, PRN, Timothy Altes, MD . Derrill Memo ON 02/02/2015] thiamine (VITAMIN B-1) tablet 100 mg, 100 mg, Oral, Daily, Timothy Altes, MD . 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   Home Assistive Devices / Equipment Home Assistive Devices/Equipment: None Home Equipment: Environmental consultant - 2 wheels, Cane - single point  Prior Functional Level Prior Function Level of Independence: Independent Comments: pt works full time at a [employer]  Current Functional Level Cognition  Overall Cognitive Status: Impaired/Different from baseline Orientation Level: Oriented to person, Oriented to place, Oriented to  time, Disoriented to situation General Comments: pt with accurate orientation but slow processing   Extremity Assessment (includes Sensation/Coordination)  Upper Extremity Assessment: Generalized weakness  Lower Extremity Assessment: RLE deficits/detail, LLE deficits/detail RLE Deficits / Details: 4+/5 hip flexion, knee flexion and extension with myotome testing however not demonstrating that same strength with functional activity with knees buckling with weight shifting LLE Deficits / Details: 4+/5 hip flexion, knee flexion and extension with myotome testing however not demonstrating that same strength with functional activity with knees buckling with weight shifting    ADLs  Anticipate ADL deficits and the need for OT interventions.    Mobility  Overal bed mobility: Needs Assistance Bed Mobility: Rolling, Sidelying to Sit Rolling: Mod assist Sidelying to sit: Mod assist General bed mobility comments: cues for sequence with delayed processing and hand over hand assist to reach for rail. Assist to elevate trunk and bring legs off of bed    Transfers  Overall transfer level: Needs assistance Transfers: Sit to/from Stand, Stand Pivot Transfers Sit to Stand: Min assist Stand pivot transfers: Mod assist General transfer comment: pt stood well from bed with cues but legs buckling with pivoting to chair with assist to control pelvis with pivot and descent to chair    Ambulation / Gait / Stairs / Wheelchair Mobility       Posture / Balance Balance Overall balance assessment: Needs assistance Sitting balance-Leahy Scale: Fair Standing balance-Leahy Scale: Poor    Special needs/care consideration BiPAP/CPAP No CPM No Continuous Drip IV No Dialysis No  Life Vest No Oxygen No Special Bed No Trach Size No Wound Vac (area) No  Skin Wife reports a sore on patient's right foot  Bowel mgmt: Last BM 01/30/15 Bladder mgmt:  Urinary catheter Diabetic mgmt Yes, on insulin at home, but was not currently taking insulin due to  no insurance for patient    Previous Home Environment Living Arrangements: Spouse/significant other Type of Home: House Home Layout: Multi-level Alternate Level Stairs-Number of Steps: 7 Home Access: Stairs to enter Technical brewer of Steps: 4 Home Care Services: No  Discharge Living Setting Plans for Discharge Living Setting: Patient's home, House, Lives with (comment) (Lives with wife.) Type of Home at Discharge: House Discharge Home Layout: Multi-level (Has one step in the hallway inside of home.) Alternate Level Stairs-Number of Steps: 1 step in hallway inside home Discharge Home Access: Level entry Does the patient have any problems obtaining your medications?: Yes (Describe)  Social/Family/Support Systems Patient Roles: Spouse, Other (Comment) (Has a wife, brother, sister in law and neighbors.) Patient has no children, but wife has 2 children. Contact Information: Makail Essa - wife (h) [phone] (c) [phone] Anticipated Caregiver: Wife if she can take FMLA, brother, sister-in-law and a neighbor may stay with patient while wife works Ability/Limitations of Caregiver: Wife works at [employer] in housekeeping 1st shift. Caregiver Availability: Other (Comment) (Wife understands patient will need supervision upon DC.) Discharge Plan Discussed with Primary Caregiver: Yes Is Caregiver In Agreement with Plan?: Yes Does Caregiver/Family have Issues with Lodging/Transportation while Pt is in Rehab?: No  Goals/Additional Needs Patient/Family Goal for Rehab: PT/OT/ST supervision goals Expected length of stay: 10-15 days Cultural Considerations: Jehovah Witness, no blood products Dietary Needs: Dys 3, nectar thick liquids Equipment Needs: TBD Pt/Family Agrees to Admission and willing to participate: Yes Program Orientation Provided & Reviewed with Pt/Caregiver  Including Roles & Responsibilities: Yes  Decrease burden of Care through IP rehab admission: N/A  Possible need for SNF placement upon discharge: Not planned, but if patient does not progress well, may need SNF at the end of rehab stay.  Patient Condition: This patient's medical and functional status has changed since the consult dated: 01/31/15 in which the Rehabilitation Physician determined and documented that the patient's condition is appropriate for intensive rehabilitative care in an inpatient rehabilitation facility. See "History of Present Illness" (above) for medical update. Functional changes are: Currently requiring mod assist for stand pivot transfers, but no ambulation yet. Patient's medical and functional status update has been discussed with the Rehabilitation physician and patient remains appropriate for inpatient rehabilitation. Will admit to inpatient rehab today.  Preadmission Screen Completed By: Timothy Lambert, 02/01/2015 12:20 PM ______________________________________________________________________  Discussed status with Dr. Naaman Plummer on 02/01/15 at 1228 and received telephone approval for admission today.  Admission Coordinator: Timothy Lambert, time1228/Date05/13/16          Cosigned by: Meredith Staggers, MD at 02/01/2015 1:35 PM  Revision History     Date/Time User Provider Type Action   02/01/2015 1:35 PM Meredith Staggers, MD Physician Cosign   02/01/2015 12:30 PM Timothy Diones, RN Rehab Admission Coordinator Sign

## 2015-02-01 NOTE — Progress Notes (Signed)
Report called to [place], RN

## 2015-02-01 NOTE — Progress Notes (Signed)
Meredith Staggers, MD Physician Signed Physical Medicine and Rehabilitation Consult Note 01/30/2015 12:08 PM  Related encounter: ED to Hosp-Admission (Current) from 01/22/2015 in [place] Collapse All        Physical Medicine and Rehabilitation Consult Reason for Consult: Seizure /STEMI Referring Physician: [facility]   [name] is a 59 y.o. right handed male with history of hypertension, alcohol abuse, diabetes mellitus and coronary artery disease maintained on aspirin. Patient lives with his wife and independent up until a few weeks ago. Presented to [facility] 01/23/2015 with seizures. Extremely agitated requiring intubation for airway protection. Alcohol level and drug screen negative. CT of the head negative for acute changes. EKG with precordial ST changes concerning for ischemia/infarct. [facility]red to [facility]. After arriving to [facility] repeat EKG with worsened ST elevations and T-wave inversions troponin greater than 65. Also noted blood pressure in the high 200s over 100s and glucose in the 400s. Underwent emergent cardiac catheterization findings of 99% LAD stenosis with stenting. Echocardiogram with ejection fraction [id number] grade 1 diastolic dysfunction. MRI 01/24/2015 showing multiple small foci of acute ischemia spanning multiple vascular territories consistent with embolic event. EEG was no seizure activity noted. Generalized low voltage slowing indicating mild to moderate cerebral disturbance encephalopathy. Carotid Dopplers with no ICA stenosis. Neurology follow-up currently maintained on aspirin therapy as well as Brilinta. Currently nothing by mouth with Jevity tube feeds. Physical therapy evaluation completed 01/29/2015 with recommendations of physical medicine rehabilitation consult.  Review of Systems  Unable to perform ROS: mental acuity   Past Medical History  Diagnosis Date  . Coronary  artery disease   . Hypertension   . Diabetes mellitus without complication    Past Surgical History  Procedure Laterality Date  . Cardiac catheterization N/A 01/23/2015    Procedure: Left Heart Cath and Coronary Angiography; Surgeon: [name], MD; Location: [facility] INVASIVE CV LAB CUPID; Service: Cardiovascular; Laterality: N/A;  . Cardiac catheterization N/A 01/23/2015    Procedure: Coronary Stent Intervention; Surgeon: [name], MD; Location: [facility] INVASIVE CV LAB CUPID; Service: Cardiovascular; Laterality: N/A;   History reviewed. No pertinent family history. Social History:  reports that he has quit smoking. His smoking use included Cigarettes. He has never used smokeless tobacco. He reports that he drinks about 16.8 oz of alcohol per week. He reports that he does not use illicit drugs. Allergies: No Known Allergies Medications Prior to Admission  Medication Sig Dispense Refill  . aspirin 325 MG tablet Take 325 mg by mouth every 6 (six) hours as needed for mild pain or moderate pain.      Home: Home Living Family/patient expects to be discharged to:: Private residence Living Arrangements: Spouse/significant other Type of Home: House Home Access: Stairs to enter Technical brewer of Steps: 4 Home Layout: Multi-level Alternate Level Stairs-Number of Steps: 7 Home Equipment: Environmental consultant - 2 wheels, [place] - single point  Functional History: Prior Function Level of Independence: Independent Comments: pt works full time at a Program[employer], applications Status:  Mobility: Bed Mobility Overal bed mobility: Needs Assistance Bed Mobility: Rolling, Sidelying to Sit Rolling: Mod assist Sidelying to sit: Mod assist General bed mobility comments: cues for sequence with delayed processing and hand over hand assist to reach for rail. Assist to elevate trunk and bring legs off of bed Transfers Overall transfer level: Needs assistance Transfers: Sit  to/from Stand, Stand Pivot Transfers Sit to Stand: Min assist Stand  pivot transfers: Mod assist General transfer comment: pt stood well from bed with cues but legs buckling with pivoting to chair with assist to control pelvis with pivot and descent to chair      ADL:    Cognition: Cognition Overall Cognitive Status: Impaired/Different from baseline Orientation Level: Oriented to person, Oriented to situation, Disoriented to time Cognition Arousal/Alertness: Lethargic Behavior During Therapy: Flat affect Overall Cognitive Status: Impaired/Different from baseline Area of Impairment: Problem solving Problem Solving: Slow processing General Comments: pt with accurate orientation but slow processing  Blood pressure 173/99, pulse 128, temperature 98.3 F (36.8 C), temperature source Axillary, resp. rate 23, height 5\' 6"  (1.676 m), weight 63.912 kg (140 lb 14.4 oz), SpO2 98 %. Physical Exam  HENT:  Head: Normocephalic.  Nasogastric tube in place  Eyes:  Pupil sluggish to light without nystagmus  Neck: Normal range of motion. Neck supple. No thyromegaly present.  Dysphonic voice  Cardiovascular: Normal rate and regular rhythm.  Respiratory: Effort normal and breath sounds normal. No respiratory distress.  GI: Soft. Bowel sounds are normal. He exhibits no distension.  Neurological:  Patient is alert, restless. Needs to be redirected at times. Wife is at bedside. Bilateral mittens in place. He was able to provide his name and age as well as place. Limited awareness of his deficits. Followed simple commands. Moves all 4's with at least 4/5 strength proximal to distal in upper/lower limbs.  Skin: Skin is warm and dry.  Psychiatric:  restless     Lab Results Last 24 Hours    Results for orders placed or performed during the hospital encounter of 01/22/15 (from the past 24 hour(s))  Glucose, capillary Status: None   Collection Time: 01/29/15 4:23 PM  Result Value Ref  Range   Glucose-Capillary 96 70 - 99 mg/dL  Glucose, capillary Status: Abnormal   Collection Time: 01/29/15 8:48 PM  Result Value Ref Range   Glucose-Capillary 103 (H) 70 - 99 mg/dL  Glucose, capillary Status: Abnormal   Collection Time: 01/29/15 11:51 PM  Result Value Ref Range   Glucose-Capillary 125 (H) 70 - 99 mg/dL  Heparin level (unfractionated) Status: Abnormal   Collection Time: 01/30/15 3:15 AM  Result Value Ref Range   Heparin Unfractionated <0.10 (L) 0.30 - 0.70 IU/mL  Magnesium in AM Status: Abnormal   Collection Time: 01/30/15 3:15 AM  Result Value Ref Range   Magnesium 2.5 (H) 1.7 - 2.4 mg/dL  CBC Status: Abnormal   Collection Time: 01/30/15 3:15 AM  Result Value Ref Range   WBC 12.7 (H) 4.0 - 10.5 K/uL   RBC 4.02 (L) 4.22 - 5.81 MIL/uL   Hemoglobin 12.4 (L) 13.0 - 17.0 g/dL   HCT 35.3 (L) 39.0 - 52.0 %   MCV 87.8 78.0 - 100.0 fL   MCH 30.8 26.0 - 34.0 pg   MCHC 35.1 30.0 - 36.0 g/dL   RDW 13.0 11.5 - 15.5 %   Platelets 342 150 - 400 K/uL  Basic metabolic panel Status: Abnormal   Collection Time: 01/30/15 3:15 AM  Result Value Ref Range   Sodium 148 (H) 135 - 145 mmol/L   Potassium 4.0 3.5 - 5.1 mmol/L   Chloride 108 101 - 111 mmol/L   CO2 25 22 - 32 mmol/L   Glucose, Bld 140 (H) 70 - 99 mg/dL   BUN 43 (H) 6 - 20 mg/dL   Creatinine, Ser 1.37 (H) 0.61 - 1.24 mg/dL   Calcium 9.8 8.9 - 10.3 mg/dL   GFR  calc non Af Amer 55 (L) >60 mL/min   GFR calc Af Amer >60 >60 mL/min   Anion gap 15 5 - 15  Phosphorus Status: None   Collection Time: 01/30/15 3:15 AM  Result Value Ref Range   Phosphorus 3.9 2.5 - 4.6 mg/dL  Glucose, capillary Status: Abnormal   Collection Time: 01/30/15 5:02 AM  Result Value Ref Range   Glucose-Capillary 149 (H) 70 - 99 mg/dL  Glucose,  capillary Status: Abnormal   Collection Time: 01/30/15 8:25 AM  Result Value Ref Range   Glucose-Capillary 201 (H) 70 - 99 mg/dL      Imaging Results (Last 48 hours)    Dg Abd Portable 1v  01/30/2015 CLINICAL DATA: Nasogastric tube placement EXAM: PORTABLE ABDOMEN - 1 VIEW COMPARISON: 01/29/2015 FINDINGS: The enteric tube extends into the stomach with tip over the region of the proximal antrum. IMPRESSION: Enteric tube extends into the stomach with tip in the proximal antrum Electronically Signed By: Andreas Newport M.D. On: 01/30/2015 02:20   Dg Abd Portable 1v  01/29/2015 CLINICAL DATA: Evaluate feeding tube placement. EXAM: PORTABLE ABDOMEN - 1 VIEW COMPARISON: 01/24/2015 FINDINGS: Small bore feeding tube is noted with tip in the peripyloric region. The bowel gas pattern is unremarkable. IMPRESSION: Feeding tube tip overlying the peripyloric region. Electronically Signed By: Margarette Canada M.D. On: 01/29/2015 13:19     Assessment/Plan: Diagnosis: Embolic CVA 1. Does the need for close, 24 hr/day medical supervision in concert with the patient's rehab needs make it unreasonable for this patient to be served in a less intensive setting? Yes 2. Co-Morbidities requiring supervision/potential complications: htn, hx MI 3. Due to bladder management, bowel management, safety, skin/wound care, disease management, medication administration, pain management and patient education, does the patient require 24 hr/day rehab nursing? Yes 4. Does the patient require coordinated care of a physician, rehab nurse, PT (1-2 hrs/day, 5 days/week), OT (1-2 hrs/day, 5 days/week) and SLP (1-2 hrs/day, 5 days/week) to address physical and functional deficits in the context of the above medical diagnosis(es)? Yes Addressing deficits in the following areas: balance, endurance, locomotion, strength, transferring, bowel/bladder control, bathing, dressing, feeding, grooming,  toileting, cognition, speech, swallowing and psychosocial support 5. Can the patient actively participate in an intensive therapy program of at least 3 hrs of therapy per day at least 5 days per week? Yes 6. The potential for patient to make measurable gains while on inpatient rehab is excellent 7. Anticipated functional outcomes upon discharge from inpatient rehab are supervision with PT, supervision with OT, supervision with SLP. 8. Estimated rehab length of stay to reach the above functional goals is: 10-15 days 9. Does the patient have adequate social supports and living environment to accommodate these discharge functional goals? Yes 10. Anticipated D/C setting: Home 11. Anticipated post D/C treatments: HH therapy and Outpatient therapy 12. Overall Rehab/Functional Prognosis: excellent  RECOMMENDATIONS: This patient's condition is appropriate for continued rehabilitative care in the following setting: CIR Patient has agreed to participate in recommended program. Potentially Note that insurance prior authorization may be required for reimbursement for recommended care.  Comment: Rehab Admissions Coordinator to follow up.  Thanks,  Meredith Staggers, MD, [facility]     01/30/2015       Revision History     Date/Time User Provider Type Action   01/31/2015 9:08 AM Meredith Staggers, MD Physician Sign   01/30/2015 12:31 PM Cathlyn Parsons, PA-C Physician Assistant [facility] Details Report  Routing History     Date/Time From To Method   01/31/2015 9:08 AM Meredith Staggers, MD Meredith Staggers, MD In Basket

## 2015-02-01 NOTE — Telephone Encounter (Signed)
7 day TOC fu appt per Hao--appt with Ignacia Bayley 02-08-15

## 2015-02-01 NOTE — Care Management Note (Signed)
Case Management Note  Patient Details  Name: MARIUS DANEHY MRN: [id number] Date of Birth: 11-06-1955  Subjective/Objective:                    Action/Plan:   Expected Discharge Date:     02/01/15             Expected Discharge Plan:  [place]  In-House Referral:     Discharge [facility]  Post Acute Care Choice:    Choice offered to:     DME Arranged:    DME Agency:     HH Arranged:    [place] Agency:     Status of Service:  Completed, signed off  Medicare Important Message Given:  No Date Medicare IM Given:    Medicare IM give by:    Date Additional Medicare IM Given:    Additional Medicare Important Message give by:     If discussed at [place] of Stay Meetings, dates discussed:    Additional Comments:dc to inpt rehab today  Lacretia Leigh, RN 02/01/2015, 11:44 AM

## 2015-02-01 NOTE — PMR Pre-admission (Signed)
PMR Admission Coordinator Pre-Admission Assessment  Patient: Timothy Lambert is an 59 y.o., male MRN: [id number] DOB: May 26, 1956 Height: 5\' 6"  (167.6 cm) Weight: 64.2 kg (141 lb 8.6 oz)              Insurance Information Self pay - no insurance.  Wife reports that she could not afford to cover patient on her insurance here at [facility] Application Date:        Case Manager:   Disability Application Date:        Case Worker:    Emergency [place]    Name Relation Home Work Mobile   [place] Spouse [phone]  [phone]   [place],Mary Sister [phone]        Current Medical History  Patient Admitting Diagnosis: Embolic CVA  History of Present Illness: A 59 y.o. right handed male with history of hypertension, alcohol abuse, diabetes mellitus and coronary artery disease maintained on aspirin. Patient lives with his wife and independent up until a few weeks ago. Presented to [facility] 01/23/2015 with seizures. Extremely agitated requiring intubation for airway protection. Alcohol level and drug screen negative. CT of the head negative for acute changes. EKG with precordial ST changes concerning for ischemia/infarct. [facility]red [facility]. After arriving to [facility] repeat EKG with worsened ST elevations and T-wave inversions troponin greater than 65. Also noted blood pressure in the high 200s over 100s and glucose in the 400s.  Underwent emergent cardiac catheterization findings of 99% LAD stenosis with stenting. Echocardiogram with ejection fraction [id number] grade 1 diastolic dysfunction. MRI 01/24/2015 showing multiple small foci of acute ischemia spanning multiple vascular territories consistent with embolic event. EEG was no seizure activity noted. Generalized low voltage slowing indicating mild to moderate cerebral disturbance encephalopathy. Carotid Dopplers with no ICA stenosis. Neurology follow-up currently  maintained on aspirin therapy as well as Brilinta. Nasogastric tube had been in place for nutritional support with modified barium swallow 01/31/2015 and placed on a mechanical soft nectar thick liquid diet. Physical therapy evaluation completed 01/29/2015 with recommendations of physical medicine rehabilitation consult. Patient to be admitted for a comprehensive inpatient rehabilitation program.     Past Medical History  Past Medical History  Diagnosis Date  . Coronary artery disease   . Hypertension   . Diabetes mellitus without complication     Family History  family history is not on file.  Prior Rehab/Hospitalizations:  No previous rehab admissions.   Current Medications   Current facility-administered medications:  .  acetaminophen (TYLENOL) suppository 650 mg, 650 mg, Rectal, Q4H PRN, Cherene Altes, MD .  acetaminophen (TYLENOL) tablet 650 mg, 650 mg, Oral, Q4H PRN, Cherene Altes, MD .  antiseptic oral rinse (CPC / CETYLPYRIDINIUM CHLORIDE 0.05%) solution 7 mL, 7 mL, Mouth Rinse, QID, Chesley Mires, MD, 7 mL at 02/01/15 0028 .  [START ON 02/02/2015] aspirin chewable tablet 81 mg, 81 mg, Oral, Daily, Cherene Altes, MD .  atorvastatin (LIPITOR) tablet 80 mg, 80 mg, Oral, q1800, Cherene Altes, MD .  carvedilol (COREG) tablet 25 mg, 25 mg, Oral, BID WC, Almyra Deforest, [place], 25 mg at 02/01/15 0654 .  chlorhexidine (PERIDEX) 0.12 % solution 15 mL, 15 mL, Mouth Rinse, BID, Chesley Mires, MD, 15 mL at 02/01/15 0944 .  docusate (COLACE) 50 MG/5ML liquid 100 mg, 100 mg, Oral, BID PRN, Cherene Altes, MD .  famotidine (PEPCID) tablet 20 mg, 20 mg, Oral, BID, Kimberlee Nearing  Thereasa Solo, MD .  Derrill Memo ON [id number] folic acid (FOLVITE) tablet 1 mg, 1 mg, Oral, Daily, Cherene Altes, MD .  furosemide (LASIX) tablet 20 mg, 20 mg, Oral, Daily, Cherene Altes, MD, 20 mg at 02/01/15 1157 .  insulin aspart (novoLOG) injection 0-15 Units, 0-15 Units, Subcutaneous, TID WC, Cherene Altes, MD .   insulin glargine (LANTUS) injection 14 Units, 14 Units, Subcutaneous, Daily, Cherene Altes, MD, 14 Units at 02/01/15 [phone] .  levETIRAcetam (KEPPRA) 100 MG/ML solution 500 mg, 500 mg, Oral, Q12H, 500 mg at 02/01/15 0944 **OR** [DISCONTINUED] levETIRAcetam (KEPPRA) 500 mg in sodium chloride 0.9 % 100 mL IVPB, 500 mg, Intravenous, Q12H, Cherene Altes, MD, 500 mg at 01/31/15 0931 .  [START ON 02/02/2015] lisinopril (PRINIVIL,ZESTRIL) tablet 10 mg, 10 mg, Oral, Daily, Cherene Altes, MD .  RESOURCE THICKENUP CLEAR, , Oral, PRN, Cherene Altes, MD .  Derrill Memo ON 02/02/2015] thiamine (VITAMIN B-1) tablet 100 mg, 100 mg, Oral, Daily, Cherene Altes, MD .  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   Home Assistive Devices / Equipment Home Assistive Devices/Equipment: None Home Equipment: Environmental consultant - 2 wheels, Cane - single point  Prior Functional Level Prior Function Level of Independence: Independent Comments: pt works full time at a Architectural technologist  Current Functional Level Cognition  Overall Cognitive Status: Impaired/Different from baseline Orientation Level: Oriented to person, Oriented to place, Oriented to time, Disoriented to situation General Comments: pt with accurate orientation but slow processing    Extremity Assessment (includes Sensation/Coordination)  Upper Extremity Assessment: Generalized weakness  Lower Extremity Assessment: RLE deficits/detail, LLE deficits/detail RLE Deficits / Details: 4+/5 hip flexion, knee flexion and extension with myotome testing however not demonstrating that same strength with functional activity with knees  buckling with weight shifting LLE Deficits / Details: 4+/5 hip flexion, knee flexion and extension with myotome testing however not demonstrating that same strength with functional activity with knees buckling with weight shifting    ADLs   Anticipate ADL deficits and the need for OT interventions.    Mobility  Overal bed mobility: Needs Assistance Bed Mobility: Rolling, Sidelying to Sit Rolling: Mod assist Sidelying to sit: Mod assist General bed mobility comments: cues for sequence with delayed processing and hand over hand assist to reach for rail. Assist to elevate trunk and bring legs off of bed    Transfers  Overall transfer level: Needs assistance Transfers: Sit to/from Stand, Stand Pivot Transfers Sit to Stand: Min assist Stand pivot transfers: Mod assist General transfer comment: pt stood well from bed with cues but legs buckling with pivoting to chair with assist to control pelvis with pivot and descent to chair    Ambulation / Gait / Stairs / Wheelchair Mobility       Posture / Balance Balance Overall balance assessment: Needs assistance Sitting balance-Leahy Scale: Fair Standing balance-Leahy Scale: Poor    Special needs/care consideration BiPAP/CPAP No CPM No Continuous Drip IV No Dialysis No        Life Vest No Oxygen No Special Bed No Trach Size No Wound Vac (area) No       Skin Wife reports a sore on patient's right foot  Bowel mgmt: Last BM 01/30/15 Bladder mgmt: Urinary catheter Diabetic mgmt Yes, on insulin at home, but was not currently taking insulin due to no insurance for patient    Previous Home Environment Living Arrangements: Spouse/significant other Type of Home: House Home Layout: Multi-level Alternate Level Stairs-Number of Steps: 7 Home Access: Stairs to enter [employer] of Steps: [place]: No  Discharge Living Setting Plans for Discharge Living Setting: Patient's home, House, Lives  with (comment) (Lives with wife.) Type of Home at Discharge: House Discharge Home Layout: Multi-level (Has one step in the hallway inside of home.) Alternate Level Stairs-Number of Steps: 1 step in hallway inside home Discharge Home Access: Level entry Does the patient have any problems obtaining your medications?: Yes (Describe)  Social/Family/Support Systems Patient Roles: Spouse, Other (Comment) (Has a wife, brother, sister in law and neighbors.)  Patient has no children, but wife has 2 children. Contact Information: Danni Mees - wife (h) [phone] (c) [phone] Anticipated Caregiver: Wife if she can take FMLA, brother, sister-in-law and a neighbor may stay with patient while wife works Ability/Limitations of Caregiver: Wife works at [employer] in housekeeping 1st shift. Caregiver Availability: Other (Comment) (Wife understands patient will need supervision upon DC.) Discharge Plan Discussed with Primary Caregiver: Yes Is Caregiver In Agreement with Plan?: Yes Does Caregiver/Family have Issues with Lodging/Transportation while Pt is in Rehab?: No  Goals/Additional Needs Patient/Family Goal for Rehab: PT/OT/ST supervision goals Expected length of stay: 10-15 days Cultural Considerations: Jehovah Witness, no blood products Dietary Needs: Dys 3, nectar thick liquids Equipment Needs: TBD Pt/Family Agrees to Admission and willing to participate: Yes Program Orientation Provided & Reviewed with Pt/Caregiver Including Roles  & Responsibilities: Yes  Decrease burden of Care through IP rehab admission: N/A  Possible need for SNF placement upon discharge: Not planned, but if patient does not progress well, may need SNF at the end of rehab stay.  Patient Condition: This patient's medical and functional status has changed since the consult dated: 01/31/15 in which the Rehabilitation Physician determined and documented that the patient's condition is appropriate for intensive  rehabilitative care in an inpatient rehabilitation facility. See "History of Present Illness" (above) for medical update. Functional changes are:  Currently requiring mod assist for stand pivot transfers, but no ambulation yet. Patient's medical and functional status update has been discussed with the Rehabilitation physician and patient remains appropriate for inpatient rehabilitation. Will admit to inpatient rehab today.  Preadmission Screen Completed By:  [name], 02/01/2015 12:20 PM ______________________________________________________________________   Discussed status with Dr.  Naaman Plummer on 02/01/15 at 1228 and received telephone approval for admission today.  Admission Coordinator:  [name], time1228/Date05/13/16

## 2015-02-01 NOTE — Progress Notes (Addendum)
Received pt. As transfer from 2 central.Pt. And his wife were oriented to the unit routine.Safety plan was explained,fall prevention plan was explained and signed.Keep monitor patient closely and assessing his needs.Welcome video was showed to pt. And his wife.

## 2015-02-01 NOTE — Progress Notes (Signed)
Rep in to apply Life Vest

## 2015-02-01 NOTE — Progress Notes (Signed)
PT Cancellation Note  Patient Details Name: Timothy Lambert MRN: [id number] DOB: [birth year]   Cancelled Treatment:    Reason Eval/Treat Not Completed: Other (comment) (D/C to Rehab planned. In process of moving. )   Bahja Bence, Godfrey Pick 02/01/2015, 4:20 PM  Gennell How,PT Acute Rehabilitation [phone] (pager)

## 2015-02-01 NOTE — Progress Notes (Signed)
Patient being discharged by IM, 7 day transition of care followup arranged. Patient was initially seen by Dr. Aundra Dubin, however, maybe more appropriate to followup with Dr. Burt Knack who did his cath or Dr. Sallyanne Kuster who saw him after.    Hilbert Corrigan PA Pager: [phone]

## 2015-02-02 ENCOUNTER — Inpatient Hospital Stay (HOSPITAL_COMMUNITY): Payer: Medicaid Other | Admitting: Physical Therapy

## 2015-02-02 ENCOUNTER — Inpatient Hospital Stay (HOSPITAL_COMMUNITY): Payer: Self-pay | Admitting: Occupational Therapy

## 2015-02-02 ENCOUNTER — Inpatient Hospital Stay (HOSPITAL_COMMUNITY): Payer: Medicaid Other | Admitting: Speech Pathology

## 2015-02-02 DIAGNOSIS — I213 ST elevation (STEMI) myocardial infarction of unspecified site: Secondary | ICD-10-CM

## 2015-02-02 LAB — GLUCOSE, CAPILLARY
GLUCOSE-CAPILLARY: 218 mg/dL — AB (ref 65–99)
GLUCOSE-CAPILLARY: 263 mg/dL — AB (ref 65–99)
GLUCOSE-CAPILLARY: 282 mg/dL — AB (ref 65–99)
GLUCOSE-CAPILLARY: 293 mg/dL — AB (ref 65–99)
Glucose-Capillary: 397 mg/dL — ABNORMAL HIGH (ref 65–99)
Glucose-Capillary: 314 mg/dL — ABNORMAL HIGH (ref 65–99)

## 2015-02-02 NOTE — Evaluation (Signed)
Speech Language Pathology Assessment and Plan  Patient Details  Name: Timothy Lambert MRN: [id number] Date of Birth: 1956/01/19  SLP Diagnosis: Dysphagia;Voice disorder;Cognitive Impairments  Rehab Potential: Excellent ELOS: 12-14 days     Today's Date: 02/02/2015 SLP Individual Time: 0900-1000 SLP Individual Time Calculation (min): 60 min   Problem List:  Patient Active Problem List   Diagnosis Date Noted  . 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 Metabolic encephalopathy   . CVA (cerebral vascular accident)   . Dysphagia   . ST elevation (STEMI) myocardial infarction involving left anterior descending coronary artery   . Acute combined systolic and diastolic congestive heart failure   . Paroxysmal atrial flutter   . Hypokalemia   . Diabetes type 2, uncontrolled   . HLD (hyperlipidemia)   . Altered mental status   . Hyperglycemia   . Seizure   . Acute embolic stroke   . Acute respiratory failure with hypoxia 01/24/2015  . Acute renal failure syndrome   . Acute respiratory failure   . Hypertensive emergency 01/23/2015  . STEMI (ST elevation myocardial infarction) 01/23/2015  . Acute MI anterior wall first episode care   . Altered mental state    Past Medical History:  Past Medical History  Diagnosis Date  . Coronary artery disease   . Hypertension   . Diabetes mellitus without complication   . Stroke   . Seizures   . Myocardial infarction    Past Surgical History:  Past Surgical History  Procedure Laterality Date  . Cardiac catheterization N/A 01/23/2015    Procedure: Left Heart Cath and Coronary Angiography;  Surgeon: [name], MD;  Location: [facility] INVASIVE CV LAB CUPID;  Service: Cardiovascular;  Laterality: N/A;  . Cardiac catheterization N/A 01/23/2015    Procedure: Coronary Stent Intervention;  Surgeon: [name], MD;  Location: [facility] INVASIVE CV LAB CUPID;  Service: Cardiovascular;  Laterality: N/A;    Assessment / Plan / Recommendation Clinical  Impression Patient is a 59 y.o. right handed male with history of hypertension, alcohol abuse, diabetes mellitus and coronary artery disease maintained on aspirin. Patient lives with his wife and independent up until a few weeks ago. Presented to [facility] 01/23/2015 with seizures. Extremely agitated requiring intubation for airway protection. Alcohol level and drug screen negative. CT of the head negative for acute changes. EKG with precordial ST changes concerning for ischemia/infarct. Transferred to [facility] for ongoing care. After arriving to [facility] repeat EKG with worsened ST elevations and T-wave inversions troponin greater than 65. Also noted blood pressure in the high 200s over 100s and glucose in the 400s.Underwent emergent cardiac catheterization findings of 99% LAD stenosis with stenting. He was also fitted with a life vest. Echocardiogram with ejection fraction 40% grade 1 diastolic dysfunction. MRI 01/24/2015 showing multiple small foci of acute ischemia spanning multiple vascular territories consistent with embolic event. EEG was no seizure activity noted. Generalized low voltage slowing indicating mild to moderate cerebral disturbance encephalopathy. Carotid Dopplers with no ICA stenosis. Neurology follow-up currently maintained on aspirin therapy as well as Brilinta. Nasogastric tube had been in place for nutritional support with modified barium swallow 01/31/2015 and placed on a mechanical soft diet with nectar thick liquids. Physical therapy evaluation completed 01/29/2015 with recommendations of physical medicine rehabilitation consult. Patient was admitted for a comprehensive rehabilitation program on 02/01/15. Patient administered a cognitive-linguistic evaluation and demonstrates moderate-severe cognitive impairments impacting sustained attention, recall of new information, functional problem solving, intellectual awareness and safety awareness. Patient is  also impulsive with  decreased ability to self-monitor and correct errors which impacts his ability to complete functional and familiar tasks safely. Patient is also dysphonic which impacts his overall intelligibility at the phrase level. Patient was also administered a BSE and demonstrated a suspected delayed swallow initiation with nectar-thick liquids and puree textures. Patient also demonstrated prolonged mastication with Dys. 3 textures and declined to donn dentures. Patient required Mod verbal and tactile cues to utilize swallow strategies due to impulsivity and demonstrated intermittent overt s/s of aspiration. However, difficult to differentiate due to patient with subtle throating clearing at baseline. Recommend patient continue current diet with full supervision for use of strategies. Patient would benefit from skilled SLP intervention to maximize cognitive and swallowing function and overall functional independence prior to discharge. Anticipate patient will require 24 hour supervision and f/u SLP services.   Skilled Therapeutic Interventions          Administered a cognitive-linguistic evaluation and BSE. Please see above for details.   SLP Assessment  Patient will need skilled Speech Lanaguage Pathology Services during CIR admission    Recommendations  Recommended Consults:  (FEES) SLP Diet Recommendations: Dysphagia 3 (Mech soft);Nectar Liquid Administration via: Cup Medication Administration: Crushed with puree Supervision: Full supervision/cueing for compensatory strategies Compensations: Slow rate;Small sips/bites;Clear throat intermittently Postural Changes and/or Swallow Maneuvers: Out of bed for meals;Seated upright 90 degrees Oral Care Recommendations: Oral care BID Recommendations for Other Services: Neuropsych consult Patient destination: Home Follow up Recommendations: Home Health SLP;Outpatient SLP;24 hour supervision/assistance Equipment Recommended: To be determined    SLP Frequency 3 to 5  out of 7 days   SLP Treatment/Interventions Cognitive remediation/compensation;Cueing hierarchy;Dysphagia/aspiration precaution training;Environmental controls;Internal/external aids;Speech/Language facilitation;Patient/family education;Therapeutic Activities;Functional tasks    Pain No/Denies Pain Prior Functioning Type of Home: House  Lives With: Spouse Available Help at Discharge: Family;Friend(s);Available 24 hours/day  Short Term Goals: Week 1: SLP Short Term Goal 1 (Week 1): Patient will consume current diet with minimal overt s/s of aspiration with Mod A verbal cues for use of swallow strategies.  SLP Short Term Goal 2 (Week 1): Patient will consume trials of thin liquids without overt s/s of aspiration to assess readiness for repeat objective swallow study with Mod A verbal cues.  SLP Short Term Goal 3 (Week 1): Patient will utilize an increased vocal intensity at the word level with Mod A verbal cues to increase intelligibility to 75%.  SLP Short Term Goal 4 (Week 1): Patient will utilize call bell to request assistance with Max A multimodal cues in 50% of observable opportunities.  SLP Short Term Goal 5 (Week 1): Patient will identify 2 physical and 2 cognitive deficits with Mod A multimodal cues.  SLP Short Term Goal 6 (Week 1): Patient will utilize external memory aids to recall new, daily information with Mod A multimodal cues.   See FIM for current functional status Refer to Care Plan for Long Term Goals  Recommendations for other services: Neuropsych  Discharge Criteria: Patient will be discharged from SLP if patient refuses treatment 3 consecutive times without medical reason, if treatment goals not met, if there is a change in medical status, if patient makes no progress towards goals or if patient is discharged from hospital.  The above assessment, treatment plan, treatment alternatives and goals were discussed and mutually agreed upon: by patient and by family  Braison Snoke,  Capers Hagmann 02/02/2015, 12:47 PM

## 2015-02-02 NOTE — Progress Notes (Signed)
02/02/15 1535 nsg  Bed alarm went off RN and NT went to room and found patient out of bed. He got out  in between  the  side rails. Patient is very impulsive claims he wants to pee. Reminded patient that he has a foley catheter. Wife wanted 4 side rails up but explained with the incident it will be a hazard and a restraint. CN notified of high fall risk patient. Blue mats placed on both side of the bed; bed alarm placed at the most sensitive part; reminded the wife to notify staff if patient will be left alone. Safety signs placed on patients door. Placed patient on recliner with quick release belt on.

## 2015-02-02 NOTE — Progress Notes (Signed)
[facility] PHYSICAL MEDICINE & REHABILITATION     PROGRESS NOTE    Subjective/Complaints: Fairly uneventful night. Perhaps a little restless. Didn't try to get out of bed.  Objective: Vital Signs: Blood pressure 146/83, pulse 88, temperature 99.1 F (37.3 C), temperature source Oral, resp. rate 18, weight 64.4 kg (141 lb 15.6 oz), SpO2 97 %. Dg Swallowing Func-speech Pathology  01/31/2015    Objective Swallowing Evaluation:    Patient Details  Name: Timothy Lambert MRN: [id number] Date of Birth: 08/25/1956  Today's Date: 01/31/2015 Time: SLP Start Time (ACUTE ONLY): 1005-SLP Stop Time (ACUTE ONLY): 1026 SLP Time Calculation (min) (ACUTE ONLY): 21 min  Past Medical History:  Past Medical History  Diagnosis Date  . Coronary artery disease   . Hypertension   . Diabetes mellitus without complication    Past Surgical History:  Past Surgical History  Procedure Laterality Date  . Cardiac catheterization N/A 01/23/2015    Procedure: Left Heart Cath and Coronary Angiography;  Surgeon: [name], MD;  Location: [facility] INVASIVE CV LAB CUPID;  Service: Cardiovascular;   Laterality: N/A;  . Cardiac catheterization N/A 01/23/2015    Procedure: Coronary Stent Intervention;  Surgeon: [name], MD;   Location: [facility] INVASIVE CV LAB CUPID;  Service: Cardiovascular;  Laterality:  N/A;   HPI:  Other Pertinent Information: 59 y.o. M brought to [facility] ED 5/4 with  seizures. In ED, was altered and extremely agitated requiring intubation  for airway protection . EKG with STEMI. Taken urgently to cath and PTCI  to LAD. Extubated 5/6 with reintubation, extubated 5/9.  Pt has been NPO  since extubation due to acute reversible dysphagia.  Has pulled NG  multiple times.    No Data Recorded  Assessment / Plan / Recommendation CHL IP CLINICAL IMPRESSIONS 01/31/2015  Therapy Diagnosis Mild pharyngeal phase dysphagia  Clinical Impression Pt presents with a mild pharyngeal dysphagia c/b trace  aspiration of thin liquids with inconsistent  cough response; mild  pharyngeal residue of solids post-swallow; delayed initiation.  Aspiration  likely secondary to continued impaired glottal closure s/p intubation.  Pt  is safe to initiate a dysphagia 3 diet with nectar-thick liquids; meds  crushed in puree.  He will require full supervision with meals due to  encephalopathy (impulsivity, poor self-monitoring of rate/bolus size).   Wife present for study and participated in education re: recommendations,  precautions.  SLP will continue to follow.       CHL IP TREATMENT RECOMMENDATION 01/31/2015  Treatment Recommendations Therapy as outlined in treatment plan below     CHL IP DIET RECOMMENDATION 01/31/2015  SLP Diet Recommendations Dysphagia 3 (Mech soft);Nectar  Liquid Administration via (None)  Medication Administration Crushed with puree  Compensations Slow rate;Small sips/bites  Postural Changes and/or Swallow Maneuvers (None)     CHL IP OTHER RECOMMENDATIONS 01/31/2015  Recommended Consults (None)  Oral Care Recommendations Oral care BID  Other Recommendations Order thickener from pharmacy     CHL IP FOLLOW UP RECOMMENDATIONS 01/31/2015  Follow up Recommendations Inpatient Rehab     CHL IP FREQUENCY AND DURATION 01/31/2015  Speech Therapy Frequency (ACUTE ONLY) (None)  Treatment Duration 1 week     SLP Swallow Goals No flowsheet data found.  No flowsheet data found.    CHL IP REASON FOR REFERRAL 01/31/2015  Reason for Referral Objectively evaluate swallowing function     CHL IP ORAL PHASE 01/31/2015  Lips (None)  Tongue (None)  Mucous membranes (None)  Nutritional status (None)  Other (  None)  Oxygen therapy (None)  Oral Phase WFL  Oral - Pudding Teaspoon (None)  Oral - Pudding Cup (None)  Oral - Honey Teaspoon (None)  Oral - Honey Cup (None)  Oral - Honey Syringe (None)  Oral - Nectar Teaspoon (None)  Oral - Nectar Cup (None)  Oral - Nectar Straw (None)  Oral - Nectar Syringe (None)  Oral - Ice Chips (None)  Oral - Thin Teaspoon (None)  Oral - Thin Cup (None)   Oral - Thin Straw (None)  Oral - Thin Syringe (None)  Oral - Puree (None)  Oral - Mechanical Soft (None)  Oral - Regular (None)  Oral - Multi-consistency (None)  Oral - Pill (None)  Oral Phase - Comment (None)      CHL IP PHARYNGEAL PHASE 01/31/2015  Pharyngeal Phase Impaired  Pharyngeal - Pudding Teaspoon (None)  Penetration/Aspiration details (pudding teaspoon) (None)  Pharyngeal - Pudding Cup (None)  Penetration/Aspiration details (pudding cup) (None)  Pharyngeal - Honey Teaspoon (None)  Penetration/Aspiration details (honey teaspoon) (None)  Pharyngeal - Honey Cup (None)  Penetration/Aspiration details (honey cup) (None)  Pharyngeal - Honey Syringe (None)  Penetration/Aspiration details (honey syringe) (None)  Pharyngeal - Nectar Teaspoon (None)  Penetration/Aspiration details (nectar teaspoon) (None)  Pharyngeal - Nectar Cup (None)  Penetration/Aspiration details (nectar cup) (None)  Pharyngeal - Nectar Straw (None)  Penetration/Aspiration details (nectar straw) (None)  Pharyngeal - Nectar Syringe (None)  Penetration/Aspiration details (nectar syringe) (None)  Pharyngeal - Ice Chips (None)  Penetration/Aspiration details (ice chips) (None)  Pharyngeal - Thin Teaspoon (None)  Penetration/Aspiration details (thin teaspoon) (None)  Pharyngeal - Thin Cup (None)  Penetration/Aspiration details (thin cup) (None)  Pharyngeal - Thin Straw (None)  Penetration/Aspiration details (thin straw) (None)  Pharyngeal - Thin Syringe (None)  Penetration/Aspiration details (thin syringe') (None)  Pharyngeal - Puree (None)  Penetration/Aspiration details (puree) (None)  Pharyngeal - Mechanical Soft (None)  Penetration/Aspiration details (mechanical soft) (None)  Pharyngeal - Regular (None)  Penetration/Aspiration details (regular) (None)  Pharyngeal - Multi-consistency (None)  Penetration/Aspiration details (multi-consistency) (None)  Pharyngeal - Pill (None)  Penetration/Aspiration details (pill) (None)  Pharyngeal Comment (None)       No flowsheet data found.  No flowsheet data found.        Amanda L. Tivis Ringer, MA CCC/SLP Pager [phone]  [name] 01/31/2015, 10:47 AM     Recent Labs  01/31/15 0253  WBC 11.6*  HGB 13.2  HCT 38.4*  PLT 384    Recent Labs  01/31/15 0253  NA 148*  K 3.4*  CL 110  GLUCOSE 105*  BUN 43*  CREATININE 1.48*  CALCIUM 9.7   CBG (last 3)   Recent Labs  02/02/15 0013 02/02/15 0443 02/02/15 0747  GLUCAP 282* 263* 218*    Wt Readings from Last 3 Encounters:  02/02/15 64.4 kg (141 lb 15.6 oz)  02/01/15 64.2 kg (141 lb 8.6 oz)    Physical Exam:  HENT:  Head: Normocephalic.  Eyes:  Pupil reactive to light   Neck: Normal range of motion. Neck supple. No thyromegaly present.  Dysphonic voice still Cardiovascular: Normal rate and regular rhythm.  Respiratory: Effort normal and breath sounds normal. No respiratory distress.  GI: Soft. Bowel sounds are normal. He exhibits no distension.  Neurological:  Patient is alert, less restless and but still impulsive.  He was able to provide his name and age as well as place. Limited awareness of his deficits. Followed simple commands. Moves all 4's with at least 4/5 strength proximal  to distal in upper/lower limbs.  Skin: Skin is warm and dry.  Psychiatric:  Pleasant and cooperative today  Assessment/Plan: 1. 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 Physiatrist is providing close team supervision and 24 hour management of active medical problems listed below. Physiatrist and rehab team continue to assess barriers to discharge/monitor patient progress toward functional and medical goals. FIM:                   Comprehension Comprehension Mode: Auditory Comprehension: 3-Understands basic 50 - 74% of the time/requires cueing 25 - 50%  of the time  Expression Expression Mode:  Verbal Expression: 5-Expresses basic 90% of the time/requires cueing < 10% of the time.  Social Interaction Social Interaction: 1-Interacts appropriately less than 25% of the time. May be withdrawn or combative.  Problem Solving Problem Solving: 2-Solves basic 25 - 49% of the time - needs direction more than half the time to initiate, plan or complete simple activities  Memory Memory: 2-Recognizes or recalls 25 - 49% of the time/requires cueing 51 - 75% of the time Medical Problem List and Plan: 1. Functional deficits secondary to embolic CVA/ s/pSTEMI status post stenting. Fitted with LifeVest  -hold on showering for the weekend at least. 2. DVT Prophylaxis/Anticoagulation: Aspirin/Brilinta as well as SCDs. Monitor for any signs of DVT 3. Pain Management: Tylenol as needed 4. Seizure disorder. Keppra 500 mg twice a day. EEG negative. 5. Neuropsych: This patient is capable of making decisions on his own behalf. 6. Skin/Wound Care: Routine skin checks 7. Fluids/Electrolytes/Nutrition: Routine I&O's with follow-up chemistries  -inadequate intake presently 8. Dysphagia. Dysphagia 3 nectar liquids. Follow-up speech therapy 9. Hypertension. Coreg 25 mg twice a day, lisinopril 10 mg daily. Monitor with increased mobility 10. Diabetes mellitus and peripheral neuropathy. Hemoglobin A1c 9.3. Lantus insulin 14 units daily. Check blood sugars before meals and at bedtime.  -sugars poorly controlled at present.   -adjust regimen as indicated 11. Alcohol abuse. Monitor for any signs of withdrawal provide counseling 12. Hyperlipidemia. Lipitor   LOS (Days) 1 A FACE TO FACE EVALUATION WAS PERFORMED  SWARTZ,ZACHARY T 02/02/2015 8:08 AM

## 2015-02-02 NOTE — Progress Notes (Signed)
Occupational Therapy Session Note  Patient Details  Name: [name] MRN: [id number] Date of Birth: Sep 18, 1956  Today's Date: 02/02/2015 OT Individual Time:  -   [id number]  (30 min)   Week 1:       Skilled Therapeutic Interventions/Progress Updates:    Pt. Sitting in wc upon OT arrival.  Nursing had just taken pt to bathroom.  Wife, [name] present.  Pt. Engaged in sit to stand, transfers, UE AROM.  Explained OT POC, ELOS and 24/7 supervision after discharge.  Pt. transferred from wc to bed with min assist (stand pivot).  Positioned pt in bed and left with bed alarm on and wife in room    Therapy Documentation Precautions:  Restrictions Weight Bearing Restrictions: No      Pain:  none       See FIM for current functional status  Therapy/Group: Individual Therapy  Lisa Roca 02/02/2015, 8:26 AM

## 2015-02-02 NOTE — Progress Notes (Signed)
+/-   sleep during night, without specific complaint of.  PO intake of 240cc's over past 12 hours. Foley patent, urine with sediment. Timothy Lambert

## 2015-02-02 NOTE — Evaluation (Signed)
Occupational Therapy Assessment and Plan  Patient Details  Name: Timothy Lambert MRN: [id number] Date of Birth: 01-13-1956  OT Diagnosis: altered mental status and cognitive deficits Rehab Potential: Rehab Potential (ACUTE ONLY): Good ELOS: 12-14 days   Today's Date: 02/02/2015 OT Individual Time: 1100-1210 OT Individual Time Calculation (min): 70 min     Problem List:  Patient Active Problem List   Diagnosis Date Noted  . 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 Metabolic encephalopathy   . CVA (cerebral vascular accident)   . Dysphagia   . ST elevation (STEMI) myocardial infarction involving left anterior descending coronary artery   . Acute combined systolic and diastolic congestive heart failure   . Paroxysmal atrial flutter   . Hypokalemia   . Diabetes type 2, uncontrolled   . HLD (hyperlipidemia)   . Altered mental status   . Hyperglycemia   . Seizure   . Acute embolic stroke   . Acute respiratory failure with hypoxia 01/24/2015  . Acute renal failure syndrome   . Acute respiratory failure   . Hypertensive emergency 01/23/2015  . STEMI (ST elevation myocardial infarction) 01/23/2015  . Acute MI anterior wall first episode care   . Altered mental state     Past Medical History:  Past Medical History  Diagnosis Date  . Coronary artery disease   . Hypertension   . Diabetes mellitus without complication   . Stroke   . Seizures   . Myocardial infarction    Past Surgical History:  Past Surgical History  Procedure Laterality Date  . Cardiac catheterization N/A 01/23/2015    Procedure: Left Heart Cath and Coronary Angiography;  Surgeon: [name], MD;  Location: [facility] INVASIVE CV LAB CUPID;  Service: Cardiovascular;  Laterality: N/A;  . Cardiac catheterization N/A 01/23/2015    Procedure: Coronary Stent Intervention;  Surgeon: [name], MD;  Location: [facility] INVASIVE CV LAB CUPID;  Service: Cardiovascular;  Laterality: N/A;    Assessment & Plan Clinical  Impression:HPI:Timothy Lambert is a 59 y.o. right handed male with history of hypertension, alcohol abuse, diabetes mellitus and coronary artery disease maintained on aspirin. Patient lives with his wife and independent up until a few weeks ago. Presented to [facility] 01/23/2015 with seizures. Extremely agitated requiring intubation for airway protection. Alcohol level and drug screen negative. CT of the head negative for acute changes. EKG with precordial ST changes concerning for ischemia/infarct. Transferred to [facility] for [facility] care. After arriving to [facility] repeat EKG with worsened ST elevations and T-wave inversions troponin greater than 65. Also noted blood pressure in the high 200s over 100s and glucose in the 400s. Underwent emergent cardiac catheterization findings of 99% LAD stenosis with stenting. He was also fitted with a life vest. Echocardiogram with ejection fraction 24% grade 1 diastolic dysfunction. MRI 01/24/2015 showing multiple small foci of acute ischemia spanning multiple vascular territories consistent with embolic event. EEG was no seizure activity noted. Generalized low voltage slowing indicating mild to moderate cerebral disturbance encephalopathy. Carotid Dopplers with no ICA stenosis. Neurology follow-up currently maintained on aspirin therapy as well as Brilinta. Nasogastric tube had been in place for nutritional support with modified barium swallow 01/31/2015 and placed on a mechanical soft nectar thick liquid diet..  Patient transferred to CIR on 02/01/2015 .    Patient currently requires mod with basic self-care skills and IADL secondary to decreased coordination and decreased attention, decreased awareness, decreased problem solving, decreased safety awareness, decreased memory and delayed processing.  Prior to  hospitalization, patient could complete BADL and IADL with independent .  Patient will benefit from skilled intervention to increase independence  with basic self-care skills and increase level of independence with iADL prior to discharge home with care partner.  Anticipate patient will require 24 hour supervision and follow up home health.  OT - End of Session Activity Tolerance: Tolerates 10 - 20 min activity with multiple rests Endurance Deficit: Yes OT Assessment Rehab Potential (ACUTE ONLY): Good Barriers to Discharge:  (will need 24/7 supervision) OT Patient demonstrates impairments in the following area(s): Balance;Behavior;Cognition;Endurance;Motor;Safety OT Basic ADL's Functional Problem(s): Grooming;Bathing;Dressing;Toileting OT Advanced ADL's Functional Problem(s): Laundry;Light Housekeeping OT Transfers Functional Problem(s): Toilet;Tub/Shower OT Additional Impairment(s): None OT Plan OT Intensity: Minimum of 1-2 x/day, 45 to 90 minutes OT Frequency: 5 out of 7 days OT Duration/Estimated Length of Stay: 12-14 days OT Treatment/Interventions: Balance/vestibular training;Cognitive remediation/compensation;Discharge planning;DME/adaptive equipment instruction;Functional mobility training;Patient/family education;Self Care/advanced ADL retraining;Therapeutic Activities;Therapeutic Exercise;UE/LE Strength taining/ROM OT Self Feeding Anticipated Outcome(s): independent OT Basic Self-Care Anticipated Outcome(s): supervision OT Toileting Anticipated Outcome(s): supervision OT Bathroom Transfers Anticipated Outcome(s): supervision OT Recommendation Recommendations for Other Services: Neuropsych consult (ETOL abuse) Patient destination: Home Follow Up Recommendations: Home health OT;24 hour supervision/assistance Equipment Recommended: Tub/shower bench   Skilled Therapeutic Intervention Pt. in wc upon OT arrival.  Pt agreed to shower.  Pt has life vest.  Wife has been educated on changing battery and removing vest for 10 minutes for shower.  Pt ambulated with RW to toilet and minimal assist.  Pt had BM.  Ambulated to shower.   Pt. Became agitated with nursing, wife trying to doff life vest and he impulsively stood up but OT able to stabilize to keep him from [place].   Pt. Spoke in soft voice except when he was agitated.  He did not consistently answer yes/no questions and wife would answer for him.  Pt.showered with cues to bathe various body parts.   Life vest can only be removed for 10 minutes during shower with batteries taken out.  Pt. Completed BADL and returned to wc with safety belt on and call bell in hand.  Wife in room as well.    OT Evaluation Precautions/Restrictions  Precautions Precautions: Fall;Other (comment) Precaution Comments: Zoll Lifevest Restrictions Weight Bearing Restrictions: No     Pain  none   Home Living/Prior Functioning Home Living Family/patient expects to be discharged to:: Private residence Living Arrangements: Spouse/significant other Available Help at Discharge: Family, Friend(s), Available 24 hours/day Type of Home: House Home Access: Stairs to enter Technical brewer of Steps: 1 Entrance Stairs-Rails: None Home Layout:  (hallway has one step in hallway to enter bathroom or bedroom) Alternate Level Stairs-Number of Steps: 7  Lives With: Spouse IADL History Homemaking Responsibilities: No Current License: Yes Mode of Transportation: Car 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 Leisure and Hobbies: golf, fish, garden Prior Function Level of Independence: Independent with gait, Independent with transfers  Able to Take Stairs?: Yes Comments: pt works full time at a Architectural technologist ADL   Vision/Perception  Vision- Assessment Eye Alignment: Within Designer, television/film set Perception: Within Functional Limits Praxis Praxis: Intact  Cognition Overall Cognitive Status: Impaired/Different from baseline Arousal/Alertness: Awake/alert Orientation Level: Oriented X4 Sustained Attention: Appears intact Memory: Impaired Memory Impairment: Decreased recall of  new information Awareness: Impaired Awareness Impairment: Intellectual impairment Problem Solving: Impaired Problem Solving Impairment: Verbal basic;Functional basic Executive Function: Reasoning;Decision Making;Self Monitoring;Self Correcting Reasoning: Impaired Reasoning Impairment: Functional basic;Verbal basic Decision Making: Impaired Decision Making Impairment: Verbal basic;Functional  basic Self Monitoring: Impaired Self Monitoring Impairment: Functional basic Self Correcting: Impaired Self Correcting Impairment: Functional basic Behaviors: Impulsive Safety/Judgment: Impaired Comments:  (jumped up from shower seat) Sensation Sensation Light Touch: Appears Intact (intact for BUE) Light Touch Impaired Details: Impaired LLE;Impaired RLE Coordination Gross Motor Movements are Fluid and Coordinated: Yes Fine Motor Movements are Fluid and Coordinated: Yes Motor  Motor Motor: Within Functional Limits Mobility  Bed Mobility Bed Mobility: Supine to Sit;Sit to Supine Supine to Sit: 3: Mod assist Sit to Supine: 4: Min assist Transfers Sit to Stand: 4: Min assist;With armrests Stand to Sit: 4: Min assist;With armrests  Trunk/Postural Assessment  Cervical Assessment Cervical Assessment: Within Functional Limits Thoracic Assessment Thoracic Assessment: Within Functional Limits Lumbar Assessment Lumbar Assessment: Within Functional Limits Postural Control Postural Control: Within Functional Limits  Balance Balance Balance Assessed: Yes Static Sitting Balance Static Sitting - Balance Support: Feet supported Static Sitting - Level of Assistance: 5: Stand by assistance Static Standing Balance Static Standing - Balance Support: During functional activity Static Standing - Level of Assistance: 4: Min assist Dynamic Standing Balance Dynamic Standing - Balance Support: During functional activity Dynamic Standing - Level of Assistance: 4: Min assist Extremity/Trunk  Assessment RUE Assessment RUE Assessment: Within Functional Limits LUE Assessment LUE Assessment: Within Functional Limits  FIM:  FIM - Eating Eating Activity: 5: Supervision/cues;5: Set-up assist for open containers FIM - Grooming Grooming Steps: Wash, rinse, dry face;Wash, rinse, dry hands Grooming: 3: Patient completes 2 of 4 or 3 of 5 steps FIM - Bathing Bathing Steps Patient Completed: Chest;Right Arm;Left Arm;Abdomen;Buttocks;Right upper leg;Left upper leg Bathing: 3: Mod-Patient completes 5-7 [id number] parts or 50-74% FIM - Upper Body Dressing/Undressing Upper body dressing/undressing steps patient completed: Thread/unthread right sleeve of front closure shirt/dress;Button/unbutton shirt Upper body dressing/undressing: 2: Max-Patient completed 25-49% of tasks FIM - Lower Body Dressing/Undressing Lower body dressing/undressing steps patient completed: Thread/unthread left pants leg;Pull pants up/down FIM - Toileting Toileting steps completed by patient: Performs perineal hygiene Toileting Assistive Devices: Grab bar or rail for support Toileting: 2: Max-Patient completed 1 of 3 steps FIM - TRadio producerDevices: Grab bars;Walker Toilet Transfers: 3-To toilet/BSC: Mod A (lift or lower assist);3-From toilet/BSC: Mod A (lift or lower assist) FIM - TSystems developerDevices: Shower cGeneticist, molecularWalk in shower;Grab bars Tub/shower Transfers: 3-Into Tub/Shower: Mod A (lift or lower/lift 2 legs);3-Out of Tub/Shower: Mod A (lift or lower/lift 2 legs)   Refer to Care Plan for Long Term Goals  Recommendations for other services: Neuropsych  Discharge Criteria: Patient will be discharged from OT if patient refuses treatment 3 consecutive times without medical reason, if treatment goals not met, if there is a change in medical status, if patient makes no progress towards goals or if patient is discharged from hospital.  The above  assessment, treatment plan, treatment alternatives and goals were discussed and mutually agreed upon: by patient and by family  ELisa Roca5/14/2016, 7:01 PM

## 2015-02-02 NOTE — Evaluation (Signed)
Physical Therapy Assessment and Plan  Patient Details  Name: Timothy Lambert MRN: [id number] Date of Birth: [birth year]  PT Diagnosis: Difficulty walking, Impaired cognition and Muscle weakness Rehab Potential: Good ELOS: 12 to 14 days   Today's Date: 02/02/2015 PT Individual Time: 0800-0900 PT Individual Time Calculation (min): 60 min    Problem List:  Patient Active Problem List   Diagnosis Date Noted  . 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 Metabolic encephalopathy   . CVA (cerebral vascular accident)   . Dysphagia   . ST elevation (STEMI) myocardial infarction involving left anterior descending coronary artery   . Acute combined systolic and diastolic congestive heart failure   . Paroxysmal atrial flutter   . Hypokalemia   . Diabetes type 2, uncontrolled   . HLD (hyperlipidemia)   . Altered mental status   . Hyperglycemia   . Seizure   . Acute embolic stroke   . Acute respiratory failure with hypoxia 01/24/2015  . Acute renal failure syndrome   . Acute respiratory failure   . Hypertensive emergency 01/23/2015  . STEMI (ST elevation myocardial infarction) 01/23/2015  . Acute MI anterior wall first episode care   . Altered mental state     Past Medical History:  Past Medical History  Diagnosis Date  . Coronary artery disease   . Hypertension   . Diabetes mellitus without complication   . Stroke   . Seizures   . Myocardial infarction    Past Surgical History:  Past Surgical History  Procedure Laterality Date  . Cardiac catheterization N/A 01/23/2015    Procedure: Left Heart Cath and Coronary Angiography;  Surgeon: [name], MD;  Location: [facility] INVASIVE CV LAB CUPID;  Service: Cardiovascular;  Laterality: N/A;  . Cardiac catheterization N/A 01/23/2015    Procedure: Coronary Stent Intervention;  Surgeon: [name], MD;  Location: [facility] INVASIVE CV LAB CUPID;  Service: Cardiovascular;  Laterality: N/A;    Assessment & Plan Clinical Impression: Patient is  a 59 y.o. right handed male with history of hypertension, alcohol abuse, diabetes mellitus and coronary artery disease maintained on aspirin. Patient lives with his wife and independent up until a few weeks ago. Presented to [facility] 01/23/2015 with seizures. Extremely agitated requiring intubation for airway protection. Alcohol level and drug screen negative. CT of the head negative for acute changes. EKG with precordial ST changes concerning for ischemia/infarct. Transferred to [facility] for [facility] care. After arriving to [facility] repeat EKG with worsened ST elevations and T-wave inversions troponin greater than 65. Also noted blood pressure in the high 200s over 100s and glucose in the 400s. Underwent emergent cardiac catheterization findings of 99% LAD stenosis with stenting. He was also fitted with a life vest. Echocardiogram with ejection fraction 01% grade 1 diastolic dysfunction. MRI 01/24/2015 showing multiple small foci of acute ischemia spanning multiple vascular territories consistent with embolic event. EEG was no seizure activity noted. Generalized low voltage slowing indicating mild to moderate cerebral disturbance encephalopathy. Carotid Dopplers with no ICA stenosis. Neurology follow-up currently maintained on aspirin therapy as well as Brilinta. Nasogastric tube had been in place for nutritional support with modified barium swallow 01/31/2015 and placed on a mechanical soft nectar thick liquid diet. Physical therapy evaluation completed 01/29/2015 with recommendations of physical medicine rehabilitation consult.   Patient transferred to CIR on 02/01/2015 .   Patient currently requires min with mobility secondary to muscle weakness, decreased cardiorespiratoy endurance and decreased safety awareness.  Prior to hospitalization, patient was independent  with mobility and lived with Spouse in a House home.  Home access is 1Stairs to enter.  Patient will benefit from skilled PT  intervention to maximize safe functional mobility, minimize fall risk and decrease caregiver burden for planned discharge home with 24 hour supervision.  Anticipate patient will benefit from follow up [place] at discharge.  PT - End of Session Activity Tolerance: Tolerates 30+ min activity with multiple rests Endurance Deficit: Yes PT Assessment Rehab Potential (ACUTE/IP ONLY): Good Barriers to Discharge: Decreased caregiver support PT Patient demonstrates impairments in the following area(s): Balance;Endurance;Safety PT Transfers Functional Problem(s): Bed Mobility;Bed to Chair;Car PT Locomotion Functional Problem(s): Stairs;Wheelchair Mobility;Ambulation PT Plan PT Intensity: Minimum of 1-2 x/day ,45 to 90 minutes PT Frequency: 5 out of 7 days PT Duration Estimated Length of Stay: 12 to 14 days PT Treatment/Interventions: Ambulation/gait training;Balance/vestibular training;Disease management/prevention;Discharge planning;DME/adaptive equipment instruction;Functional electrical stimulation;Functional mobility training;Patient/family education;Neuromuscular re-education;Splinting/orthotics;Therapeutic Exercise;UE/LE Coordination activities;Visual/perceptual remediation/compensation;Therapeutic Activities;Stair training;UE/LE Strength taining/ROM;Wheelchair propulsion/positioning PT Transfers Anticipated Outcome(s): S transfers PT Locomotion Anticipated Outcome(s): S ambulation, mod I w/c mobility, S for stairs PT Recommendation Follow Up Recommendations: Home health PT Patient destination: Home Equipment Recommended: To be determined  Skilled Therapeutic Intervention PT evaluation completed and treatment plan initiated. Pt performed multiple sit to stand transfers with/without rolling walker, min A and verbal cues for safety. Decrease recall of new information noted. Pt ambulated with rolling walker and min A about 80 feet with verbal cues for safety. While ambulating noted with walker, noted  increased difficulty with turns to R as well as pt tends to veer to R while ambulating. Pt returned to room and left sitting up in w/c with wife at bedside and call bell within reach.   PT Evaluation Precautions/Restrictions Precautions Precautions: Fall;Other (comment) Precaution Comments: Zoll Lifevest Restrictions Weight Bearing Restrictions: No General Chart Reviewed: Yes Family/Caregiver Present: Yes  Therapy Vitals Pulse Rate: 99 BP: 104/72 mmHg Patient Position (if appropriate): Sitting Oxygen Therapy SpO2: 99 % O2 Device: Not Delivered Pain Pt c/o soreness neck region where line was.   Home Living/Prior Functioning Home Living Available Help at Discharge: Family;Friend(s);Available 24 hours/day Type of Home: House Home Access: Stairs to enter [employer] of Steps: 1 Entrance Stairs-Rails: None Home Layout: Other (Comment) (one step down to master bedroom, no rail)  Lives With: Spouse Prior Function Level of Independence: Independent with gait;Independent with transfers  Able to Take Stairs?: Yes Vision/Perception  As per OT evaluation. Cognition Overall Cognitive Status: Impaired/Different from baseline Arousal/Alertness: Awake/alert Orientation Level: Oriented X4 Memory: Impaired Memory Impairment: Decreased recall of new information Awareness: Impaired Behaviors: Impulsive Safety/Judgment: Impaired Sensation Sensation Light Touch: Impaired Detail Light Touch Impaired Details: Impaired LLE;Impaired RLE (B feet severe impairment to light touch) Motor  Motor Motor: Within Functional Limits  Mobility Bed Mobility Bed Mobility: Supine to Sit;Sit to Supine Supine to Sit: 3: Mod assist Sit to Supine: 4: Min assist Transfers Transfers: Yes Sit to Stand: 4: Min assist;With armrests Stand to Sit: 4: Min assist;With armrests Stand Pivot Transfers: 4: Min assist;With armrests Locomotion  Ambulation Ambulation: Yes Ambulation/Gait Assistance:  3: Mod assist Ambulation Distance (Feet): 10 Feet Assistive device: None Stairs / Additional Locomotion Stairs: Yes Stair Management Technique: Two rails Number of Stairs: 2 Height of Stairs: 6 Ramp: 4: Min Chemical engineer: Yes Wheelchair Assistance: 5: Careers information officer: Both upper extremities;Both lower extermities Distance: 125  Trunk/Postural Assessment  Cervical Assessment Cervical Assessment: Within Functional Limits Thoracic Assessment Thoracic Assessment: Within Functional Limits Lumbar Assessment  Lumbar Assessment: Within Functional Limits Postural Control Postural Control: Deficits on evaluation  Balance Balance Balance Assessed: Yes Static Sitting Balance Static Sitting - Balance Support: Feet supported Static Sitting - Level of Assistance: 5: Stand by assistance Static Standing Balance Static Standing - Balance Support: During functional activity Static Standing - Level of Assistance: 4: Min assist Dynamic Standing Balance Dynamic Standing - Balance Support: During functional activity Dynamic Standing - Level of Assistance: 4: Min assist Extremity Assessment B UEs as per OT evaluation.   RLE Assessment RLE Assessment: Exceptions to [facility] RLE AROM (degrees) Overall AROM Right Lower Extremity: Within functional limits for tasks assessed RLE Strength RLE Overall Strength: Deficits RLE Overall Strength Comments: grossly 3/5 LLE Assessment LLE Assessment: Exceptions to WFL LLE AROM (degrees) Overall AROM Left Lower Extremity: Within functional limits for tasks assessed LLE Strength LLE Overall Strength: Deficits LLE Overall Strength Comments: grossly 3/5 throughout  FIM:  FIM - Control and instrumentation engineer Devices:  (head of bed flat, no rails) Bed/Chair Transfer: 3: Supine > Sit: Mod A (lifting assist/Pt. 50-74%/lift 2 legs;4: Sit > Supine: Min A (steadying pt. > 75%/lift 1 leg);4: Bed >  Chair or W/C: Min A (steadying Pt. > 75%);4: Chair or W/C > Bed: Min A (steadying Pt. > 75%) FIM - Locomotion: Wheelchair Distance: 125 Locomotion: Wheelchair: 2: Travels 50 - 149 ft with supervision, cueing or coaxing FIM - Locomotion: Ambulation Locomotion: Ambulation Assistive Devices: Other (comment);Walker - Rolling (no assistive device) Ambulation/Gait Assistance: 3: Mod assist Locomotion: Ambulation: 2: Travels 50 - 149 ft with minimal assistance (Pt.>75%) FIM - Locomotion: Stairs Locomotion: Scientist, physiological: Hand rail - 2 Locomotion: Stairs: 1: Up and Down < 4 stairs with minimal assistance (Pt.>75%)   Refer to Care Plan for Long Term Goals  Recommendations for other services: None  Discharge Criteria: Patient will be discharged from PT if patient refuses treatment 3 consecutive times without medical reason, if treatment goals not met, if there is a change in medical status, if patient makes no progress towards goals or if patient is discharged from hospital.  The above assessment, treatment plan, treatment alternatives and goals were discussed and mutually agreed upon: by patient and by family  Dub Amis 02/02/2015, 11:23 AM

## 2015-02-02 NOTE — Plan of Care (Signed)
Problem: RH BLADDER ELIMINATION Goal: RH STG MANAGE BLADDER WITH EQUIPMENT WITH ASSISTANCE STG Manage Bladder With Equipment With max Assistance  Outcome: Not Progressing Patient has foley cath due to I's and O's on lasix

## 2015-02-03 ENCOUNTER — Inpatient Hospital Stay (HOSPITAL_COMMUNITY): Payer: Medicaid Other | Admitting: Occupational Therapy

## 2015-02-03 LAB — GLUCOSE, CAPILLARY
GLUCOSE-CAPILLARY: 280 mg/dL — AB (ref 65–99)
GLUCOSE-CAPILLARY: 283 mg/dL — AB (ref 65–99)
GLUCOSE-CAPILLARY: 291 mg/dL — AB (ref 65–99)
Glucose-Capillary: 419 mg/dL — ABNORMAL HIGH (ref 65–99)
Glucose-Capillary: 339 mg/dL — ABNORMAL HIGH (ref 65–99)
Glucose-Capillary: 229 mg/dL — ABNORMAL HIGH (ref 65–99)

## 2015-02-03 MED ORDER — INSULIN GLARGINE 100 UNIT/ML ~~LOC~~ SOLN
24.0000 [IU] | Freq: Every day | SUBCUTANEOUS | Status: DC
  Administered 2015-02-03 – 2015-02-14 (×12): 24 [IU] via SUBCUTANEOUS
  Filled 2015-02-03 (×12): qty 0.24

## 2015-02-03 NOTE — Progress Notes (Signed)
Occupational Therapy Session Note  Patient Details  Name: Timothy Lambert MRN: [id number] Date of Birth: 1956/02/25  Today's Date: 02/03/2015 OT Individual Time: [id number] OT Individual Time Calculation (min): 45 min    Short Term Goals: Week 1:  OT Short Term Goal 1 (Week 1): Pt. will bathe self at supervision level OT Short Term Goal 2 (Week 1): Pt. will dress self at supervision level OT Short Term Goal 3 (Week 1): Pt. will show better impulse control by not getting up w/o supervision. OT Short Term Goal 4 (Week 1): pt will transfer to toilet with SBA OT Short Term Goal 5 (Week 1): Pt will transfer to shower bench with SBA  Skilled Therapeutic Interventions/Progress Updates:    Pt. Taken to gym. Focus of treatment was standing balance, UE AROM in functional task, following one step commands.  Pt. Transferred with minimal assist from wc to mat.  Engaged in [place] standing with sit rest break every 3-4 minutes.  Educated pt on stepping forward with Left LE during tossing.  He was able to step forward 1 foot with wide stance  With min assist for balance.  Pt's wife, [name], and 2 brothers present during session.  Pt verbalized in low whisper voice during treatment.  Transferred back to wc.  Pt propelled wc to day room and attended the stroke support group with family.  He wanted some "Real" water.  Provided him with thickened water which he drank.    Therapy Documentation Precautions:  Precautions Precautions: Fall, Other (comment) Precaution Comments: Zoll Lifevest Restrictions Weight Bearing Restrictions: No      Pain:  none     See FIM for current functional status  Therapy/Group: Individual Therapy  Lisa Roca 02/03/2015, 6:28 PM

## 2015-02-03 NOTE — Progress Notes (Signed)
[place] [facility] PHYSICAL MEDICINE & REHABILITATION     PROGRESS NOTE    Subjective/Complaints: Attempts to get OOB bed noted. No apparent problems overnight.   Objective: Vital Signs: Blood pressure 152/87, pulse 88, temperature 98.5 F (36.9 C), temperature source Oral, resp. rate 18, weight 64.4 kg (141 lb 15.6 oz), SpO2 99 %. No results found. No results for input(s): WBC, HGB, HCT, PLT in the last 72 hours. No results for input(s): NA, K, CL, GLUCOSE, BUN, CREATININE, CALCIUM in the last 72 hours.  Invalid input(s): CO CBG (last 3)   Recent Labs  02/02/15 2020 02/03/15 0001 02/03/15 0409  GLUCAP 293* 283* 291*    Wt Readings from Last 3 Encounters:  02/02/15 64.4 kg (141 lb 15.6 oz)  02/01/15 64.2 kg (141 lb 8.6 oz)    Physical Exam:  HENT:  Head: Normocephalic.  Eyes:  Pupil reactive to light   Neck: Normal range of motion. Neck supple. No thyromegaly present.  Dysphonic voice persistent Cardiovascular: Normal rate and regular rhythm.  Respiratory: Effort normal and breath sounds normal. No respiratory distress.  GI: Soft. Bowel sounds are normal. He exhibits no distension.  Neurological:  Patient is alert, less restless and but still impulsive.  Limited awareness of his deficits. Followed simple commands. Moves all 4's with at least 4/5 strength proximal to distal in upper/lower limbs.  Skin: Skin is warm and dry.  Psychiatric:  Pleasant and cooperative in general   Assessment/Plan: 1. 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 Physiatrist is providing close team supervision and 24 hour management of active medical problems listed below. Physiatrist and rehab team continue to assess barriers to discharge/monitor patient progress toward functional and medical goals. FIM: FIM - Bathing Bathing Steps Patient Completed: Chest, Right Arm, Left  Arm, Abdomen, Buttocks, Right upper leg, Left upper leg Bathing: 3: Mod-Patient completes 5-7 67f 10 parts or 50-74%  FIM - Upper Body Dressing/Undressing Upper body dressing/undressing steps patient completed: Thread/unthread right sleeve of front closure shirt/dress, Button/unbutton shirt Upper body dressing/undressing: 2: Max-Patient completed 25-49% of tasks FIM - Lower Body Dressing/Undressing Lower body dressing/undressing steps patient completed: Thread/unthread left pants leg, Pull pants up/down Lower body dressing/undressing: 2: Max-Patient completed 25-49% of tasks  FIM - Toileting Toileting steps completed by patient: Performs perineal hygiene Toileting Assistive Devices: Grab bar or rail for support Toileting: 2: Max-Patient completed 1 of 3 steps  FIM - Radio producer Devices: Grab bars, Insurance account manager Transfers: 3-To toilet/BSC: Mod A (lift or lower assist), 3-From toilet/BSC: Mod A (lift or lower assist)  FIM - Control and instrumentation engineer Devices:  (head of bed flat, no rails) Bed/Chair Transfer: 3: Supine > Sit: Mod A (lifting assist/Pt. 50-74%/lift 2 legs, 4: Sit > Supine: Min A (steadying pt. > 75%/lift 1 leg), 4: Bed > Chair or W/C: Min A (steadying Pt. > 75%), 4: Chair or W/C > Bed: Min A (steadying Pt. > 75%)  FIM - Locomotion: Wheelchair Distance: 125 Locomotion: Wheelchair: 2: Travels 50 - 149 ft with supervision, cueing or coaxing FIM - Locomotion: Ambulation Locomotion: Ambulation Assistive Devices: Other (comment), Walker - Rolling (no assistive device) Ambulation/Gait Assistance: 3: Mod assist Locomotion: Ambulation: 2: Travels 50 - 149 ft with minimal assistance (Pt.>75%)  Comprehension Comprehension Mode: Auditory Comprehension: 4-Understands basic 75 - 89% of the time/requires cueing 10 - 24% of the time  Expression Expression Mode: Verbal  Expression: 4-Expresses basic 75 - 89% of the time/requires  cueing 10 - 24% of the time. Needs helper to occlude trach/needs to repeat words.  Social Interaction Social Interaction: 4-Interacts appropriately 75 - 89% of the time - Needs redirection for appropriate language or to initiate interaction.  Problem Solving Problem Solving: 2-Solves basic 25 - 49% of the time - needs direction more than half the time to initiate, plan or complete simple activities  Memory Memory: 3-Recognizes or recalls 50 - 74% of the time/requires cueing 25 - 49% of the time Medical Problem List and Plan: 1. Functional deficits secondary to embolic CVA/ s/pSTEMI status post stenting. Fitted with LifeVest  -hold on showering for the weekend at least. 2. DVT Prophylaxis/Anticoagulation: Aspirin/Brilinta as well as SCDs. Monitor for any signs of DVT 3. Pain Management: Tylenol as needed 4. Seizure disorder. Keppra 500 mg twice a day. EEG negative. 5. Neuropsych: This patient is capable of making decisions on his own behalf.  -bed alarm, ?mittens for safety---close observation/safety plan 6. Skin/Wound Care: Routine skin checks 7. Fluids/Electrolytes/Nutrition: Routine I&O's with follow-up chemistries  -inadequate intake presently 8. Dysphagia. Dysphagia 3 nectar liquids. Follow-up speech therapy 9. Hypertension. Coreg 25 mg twice a day, lisinopril 10 mg daily. Monitor with increased mobility 10. Diabetes mellitus and peripheral neuropathy. Hemoglobin A1c 9.3. Lantus insulin 14 units daily. Check blood sugars before meals and at bedtime.  -sugars poorly controlled at present.   -increase lantus to 24u qam  (on 14u at home) 11. Alcohol abuse. Monitor for any signs of withdrawal provide counseling 12. Hyperlipidemia. Lipitor   LOS (Days) 2 A FACE TO FACE EVALUATION WAS PERFORMED  Timothy Lambert T 02/03/2015 7:28 AM

## 2015-02-04 ENCOUNTER — Inpatient Hospital Stay (HOSPITAL_COMMUNITY): Payer: Self-pay | Admitting: Speech Pathology

## 2015-02-04 ENCOUNTER — Inpatient Hospital Stay (HOSPITAL_COMMUNITY): Payer: Medicaid Other | Admitting: Occupational Therapy

## 2015-02-04 ENCOUNTER — Inpatient Hospital Stay (HOSPITAL_COMMUNITY): Payer: Medicaid Other

## 2015-02-04 LAB — GLUCOSE, CAPILLARY
GLUCOSE-CAPILLARY: 155 mg/dL — AB (ref 65–99)
GLUCOSE-CAPILLARY: 157 mg/dL — AB (ref 65–99)
GLUCOSE-CAPILLARY: 185 mg/dL — AB (ref 65–99)
Glucose-Capillary: 154 mg/dL — ABNORMAL HIGH (ref 65–99)
Glucose-Capillary: 157 mg/dL — ABNORMAL HIGH (ref 65–99)
Glucose-Capillary: 173 mg/dL — ABNORMAL HIGH (ref 65–99)
Glucose-Capillary: 264 mg/dL — ABNORMAL HIGH (ref 65–99)

## 2015-02-04 LAB — CBC WITH DIFFERENTIAL/PLATELET
BASOS PCT: 0 % (ref 0–1)
Basophils Absolute: 0 10*3/uL (ref 0.0–0.1)
Eosinophils Absolute: 0.4 10*3/uL (ref 0.0–0.7)
Eosinophils Relative: 2 % (ref 0–5)
HCT: 34 % — ABNORMAL LOW (ref 39.0–52.0)
HEMOGLOBIN: 11.8 g/dL — AB (ref 13.0–17.0)
Lymphocytes Relative: 13 % (ref 12–46)
Lymphs Abs: 2.4 10*3/uL (ref 0.7–4.0)
MCH: 30.3 pg (ref 26.0–34.0)
MCHC: 34.7 g/dL (ref 30.0–36.0)
MCV: 87.2 fL (ref 78.0–100.0)
MONO ABS: 1 10*3/uL (ref 0.1–1.0)
MONOS PCT: 5 % (ref 3–12)
NEUTROS ABS: 15.1 10*3/uL — AB (ref 1.7–7.7)
Neutrophils Relative %: 80 % — ABNORMAL HIGH (ref 43–77)
Platelets: 405 10*3/uL — ABNORMAL HIGH (ref 150–400)
RBC: 3.9 MIL/uL — AB (ref 4.22–5.81)
RDW: 12.3 % (ref 11.5–15.5)
WBC: 18.9 10*3/uL — AB (ref 4.0–10.5)

## 2015-02-04 LAB — COMPREHENSIVE METABOLIC PANEL
ALK PHOS: 60 U/L (ref 38–126)
ALT: 30 U/L (ref 17–63)
AST: 21 U/L (ref 15–41)
Albumin: 2.7 g/dL — ABNORMAL LOW (ref 3.5–5.0)
Anion gap: 9 (ref 5–15)
BUN: 45 mg/dL — ABNORMAL HIGH (ref 6–20)
CO2: 27 mmol/L (ref 22–32)
Calcium: 9.2 mg/dL (ref 8.9–10.3)
Chloride: 104 mmol/L (ref 101–111)
Creatinine, Ser: 1.55 mg/dL — ABNORMAL HIGH (ref 0.61–1.24)
GFR, EST AFRICAN AMERICAN: 55 mL/min — AB (ref >60)
GFR, EST NON AFRICAN AMERICAN: 48 mL/min — AB (ref >60)
Glucose, Bld: 188 mg/dL — ABNORMAL HIGH (ref 65–99)
Potassium: 4.9 mmol/L (ref 3.5–5.1)
SODIUM: 140 mmol/L (ref 135–145)
Total Bilirubin: 0.5 mg/dL (ref 0.3–1.2)
Total Protein: 7.4 g/dL (ref 6.5–8.1)

## 2015-02-04 NOTE — Telephone Encounter (Signed)
Tried call patient, no answer

## 2015-02-04 NOTE — Progress Notes (Signed)
Physical Therapy Session Note  Patient Details  Name: Timothy Lambert MRN: [id number] Date of Birth: 09-16-1956  Today's Date: 02/04/2015 PT Individual Time: 1420-1550 PT Individual Time Calculation (min): 90 min   Short Term Goals: Week 1:  PT Short Term Goal 1 (Week 1): Pt will increase bed mobility supine to edge of bed with min A, edge of bed to supine with S. PT Short Term Goal 2 (Week 1): Pt will increase transfers bed to chair, chair to bed with min guard.  PT Short Term Goal 3 (Week 1): Pt will ambulate 150 feet with LRAD and min guard.  PT Short Term Goal 4 (Week 1): Pt will ascend/descend 1 step with no rails and min A.  PT Short Term Goal 5 (Week 1): Pt will propel w/c about 150 feet with S.   Skilled Therapeutic Interventions/Progress Updates:   Supine> sit  With manual cues for keeping trunk forward as he moved into R sidelyng,   Pt used incentive inspirometer x 10 with VCS; PT instructed pt's dtr and cousin to encourage this use as often as possible. Pt has a congested cough.  W/c propulsion using bil UEs x 100' with supervision x 2 , cues for route finding.  Pt incorrectly stated he had never been in the gym.  Basic transfers min assist w/c>< bed/mat.  neuromuscular re-education via forced use, manual cues, VCs for: - trunk shortening/lengthening/rotating while reaching out of BOS to R and L, demonstrating trunk righting reactions well in bil directions -standing on level tile and also on wedge with 2>1>0 UE support, for bil LE heel core stretching and facilitation of balance strategies.  Pt had unplanned sitting 2/3 trials. -bil bridging with football between knees for core activitation, x 10 x 2  Pt has very slow and ineffective ankle and hip strategies.  Upon sit> stand, pt had LOB forward 2/3 trials.  Pt required multiple supine rest breaks during session.  Pt required tactile and verbal cuing each time supine> sit to move through R sidelying and effectively use RUE  to push up.  Gait with RW x 100' , x 25' with min assist> mod assist for impulsive turns.  10 M timed walk test= 16 seconds. Up/down 1 step with RW, x 2, with min/mod assist and mod VCS  for balance, sequence, impulsivity.  Pt tends to place RW unsafely forward when ascending and descending.  Pt exhausted at end of session.  Returned to bed; bed alarm set, all needs placed within reach.     Therapy Documentation Precautions:  Precautions Precautions: Fall, Other (comment) Precaution Comments: Zoll Lifevest Restrictions Weight Bearing Restrictions: No Pain: Pain Assessment Pain Assessment: No/denies pain   Locomotion : Ambulation Ambulation/Gait Assistance: 3: Mod assist Wheelchair Mobility Distance: 100         See FIM for current functional status  Therapy/Group: Individual Therapy  Gena Laski 02/04/2015, 4:05 PM

## 2015-02-04 NOTE — Progress Notes (Signed)
Occupational Therapy Session Note  Patient Details  Name: Timothy Lambert MRN: [id number] Date of Birth: [birth year]  Today's Date: 02/04/2015 OT Individual Time: 1100-1200 OT Individual Time Calculation (min): 60 min    Short Term Goals: Week 1:  OT Short Term Goal 1 (Week 1): Pt. will bathe self at supervision level OT Short Term Goal 2 (Week 1): Pt. will dress self at supervision level OT Short Term Goal 3 (Week 1): Pt. will show better impulse control by not getting up w/o supervision. OT Short Term Goal 4 (Week 1): pt will transfer to toilet with SBA OT Short Term Goal 5 (Week 1): Pt will transfer to shower bench with SBA  Skilled Therapeutic Interventions/Progress Updates:    Engaged in ADL retraining and therapeutic activity with focus on standing tolerance and overall activity tolerance.  Pt in bed upon arrival requiring increased time to initiate participation in treatment session.  Pt with no clean clothes but donned socks and shoes with increased time and cues.  In therapy gym engaged in 3D pipe tree with focus on BUE use, sequencing, initiation, and standing tolerance.  Pt would stand when cued with supervision but would not remain standing without mod-max cues, then only maintaining standing 1-1.5 minutes at a time.  Required increased time and cues for sequencing and problem solving with locating appropriate pieces to assemble.  Pt requested regular water, but willing to drink nectar thick water with no issues.  Pt returned to room and bed with bed alarm set on most sensitive setting due to impulsivity.  Therapy Documentation Precautions:  Precautions Precautions: Fall, Other (comment) Precaution Comments: Zoll Lifevest Restrictions Weight Bearing Restrictions: No Pain: Pain Assessment Pain Assessment: No/denies pain  See FIM for current functional status  Therapy/Group: Individual Therapy  Simonne Come 02/04/2015, 12:15 PM

## 2015-02-04 NOTE — Progress Notes (Signed)
Social Work Assessment and Plan Social Work Assessment and Plan  Patient Details  Name: Timothy Lambert MRN: [id number] Date of Birth: 06-30-1956  Today's Date: 02/04/2015  Problem List:  Patient Active Problem List   Diagnosis Date Noted  . 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 Metabolic encephalopathy   . CVA (cerebral vascular accident)   . Dysphagia   . ST elevation (STEMI) myocardial infarction involving left anterior descending coronary artery   . Acute combined systolic and diastolic congestive heart failure   . Paroxysmal atrial flutter   . Hypokalemia   . Diabetes type 2, uncontrolled   . HLD (hyperlipidemia)   . Altered mental status   . Hyperglycemia   . Seizure   . Acute embolic stroke   . Acute respiratory failure with hypoxia 01/24/2015  . Acute renal failure syndrome   . Acute respiratory failure   . Hypertensive emergency 01/23/2015  . STEMI (ST elevation myocardial infarction) 01/23/2015  . Acute MI anterior wall first episode care   . Altered mental state    Past Medical History:  Past Medical History  Diagnosis Date  . Coronary artery disease   . Hypertension   . Diabetes mellitus without complication   . Stroke   . Seizures   . Myocardial infarction    Past Surgical History:  Past Surgical History  Procedure Laterality Date  . Cardiac catheterization N/A 01/23/2015    Procedure: Left Heart Cath and Coronary Angiography;  Surgeon: [name], MD;  Location: [facility] INVASIVE CV LAB CUPID;  Service: Cardiovascular;  Laterality: N/A;  . Cardiac catheterization N/A 01/23/2015    Procedure: Coronary Stent Intervention;  Surgeon: [name], MD;  Location: [facility] INVASIVE CV LAB CUPID;  Service: Cardiovascular;  Laterality: N/A;   Social History:  reports that he has quit smoking. His smoking use included Cigarettes. He has never used smokeless tobacco. He reports that he drinks about 16.8 oz of alcohol per week. He reports that he does not use illicit  drugs.  Family / Support Systems Marital Status: Married Patient Roles: Spouse, Other (Comment) (employee) Spouse/Significant Other: [name]  [phone]-cell  [phone]-home Children: two step-children Other Supports: brother and sister in-law along with neighbors Anticipated Caregiver: Wife and other family members-wife if needs to can take FMLA but not for long due to financial issues Ability/Limitations of Caregiver: Wife works at [employer] in [place] Availability: Other (Comment) (family trying to come up with a plan-aware will need 24 hr) Family Dynamics: Close knit family who are supportive and involved and wiling to assist at discharge.  Wife is talking with others to see how much assistance they can provide at discharge.  Pt is quite impulsive and will need close supervision level at home.  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   Abuse/Neglect Physical Abuse: Denies Verbal Abuse: Denies Sexual Abuse: Denies Exploitation of patient/patient's resources: Denies Self-Neglect: Denies  Emotional Status Pt's affect, behavior adn adjustment status: Pt is quite tired from am therapies, he has been sleeping since lunchtime.  Step-daughter reports he was tired before therapies.  He is one to be independent and does what he wants to do, not used  to relying upon others.  He will do what they ask him so he can get out of here soon. Recent Psychosocial Issues: health issues but had no insurance so didn't take medicines or see his MD Pyschiatric History: No history Substance Abuse History:  ETOH history wife feels this is doen with all of his current health issues.   Patient / Family Perceptions, Expectations & Goals Pt/Family understanding of illness & functional limitations: Wife and step-daughter can explain his issues and treatment plan.  Both are hopeful he will do well on rehab and be more mobile and recover from this. Wife does speak with doctor's and feels her questions and concerns are being addressed.  Premorbid pt/family roles/activities: Husband, Employee, brother, friend, step-father, etc Anticipated changes in roles/activities/participation: resume Pt/family expectations/goals: Pt states: " Want to get better and go home."  Wife states: " I hope he recovers from this and can get more independent."  Step-daughter states: " He will do whatever to get home."  [employer]: None Premorbid Home Care/DME Agencies: None Transportation available at discharge: [employer] referrals recommended: Neuropsychology, Support group (specify)  Discharge Planning Living Arrangements: Spouse/significant other Support Systems: Spouse/significant other, Children, Other relatives, Friends/neighbors Type of Residence: Private residence Insurance Resources: Government social research officer (Applying for [employer]) Financial Resources: Family Support, Employment Financial Screen Referred: Yes Living Expenses: Lives with family Money Management: Spouse, Patient Does the patient have any problems obtaining your medications?: Yes (Describe) (No insurance was not seeing a MD) Home Management: Wife Patient/Family Preliminary Plans: Return home with wife and other family members providing assistance.  Family is aware he will require 24 hr care when first goes home. Wife is working on this with rest of the family.  Pt is impulsive and will need someone right there to keep him safe. Social Work Anticipated Follow Up Needs: HH/OP, Support Group  Clinical Impression Exhausted gentleman who  is sleeping in bed, while information is obtained from wife and step-daughter.  Aware pt will need 24 hr care upon discharge, hoping it is not physical care. They rallying their family members and seeing who can provide what and how many hours, trying to cover when wife works since she is the only income earner right now.  Wfie applying for disability Tues 5/17 at 3;30pm Will also have apply for Medicaid while here.  Will set up with PCP and see if other resources eligible for while here.  [name] 02/04/2015, 2:43 PM

## 2015-02-04 NOTE — Care Management Note (Signed)
Inpatient [place] Individual Statement of Services  Patient Name:  Timothy Lambert  Date:  02/04/2015  Welcome to the [facility].  Our goal is to provide you with an individualized program based on your diagnosis and situation, designed to meet your specific needs.  With this comprehensive rehabilitation program, you will be expected to participate in at least 3 hours of rehabilitation therapies Monday-Friday, with modified therapy programming on the weekends.  Your rehabilitation program will include the following services:  Physical Therapy (PT), Occupational Therapy (OT), Speech Therapy (ST), 24 hour per day rehabilitation nursing, Therapeutic Recreaction (TR), Neuropsychology, Case Management (Social Worker), Rehabilitation Medicine, Nutrition Services and Pharmacy Services  Weekly team conferences will be held on Wednesday to discuss your progress.  Your Social Worker will talk with you frequently to get your input and to update you on team discussions.  Team conferences with you and your family in attendance may also be held.  Expected length of stay: 12-14 days Overall anticipated outcome: supervision with cues  Depending on your progress and recovery, your program may change. Your Social Worker will coordinate services and will keep you informed of any changes. Your Social Worker's name and contact numbers are listed  below.  The following services may also be recommended but are not provided by the [facility] will be made to provide these services after discharge if needed.  Arrangements include referral to agencies that provide these services.  Your insurance has been verified to be:  None Your primary doctor is:  none  Pertinent information will be shared with your doctor and your  insurance company.  Social Worker:  Ovidio Kin, [place] or ([phone]  Information discussed with and copy given to patient by: [name], 02/04/2015, 12:54 PM

## 2015-02-04 NOTE — Progress Notes (Signed)
Speech Language Pathology Daily Session Note  Patient Details  Name: Timothy Lambert MRN: [id number] Date of Birth: 1955-10-04  Today's Date: 02/04/2015 SLP Individual Time: 0900-1000 SLP Individual Time Calculation (min): 60 min  Short Term Goals: Week 1: SLP Short Term Goal 1 (Week 1): Patient will consume current diet with minimal overt s/s of aspiration with Mod A verbal cues for use of swallow strategies.  SLP Short Term Goal 2 (Week 1): Patient will consume trials of thin liquids without overt s/s of aspiration to assess readiness for repeat objective swallow study with Mod A verbal cues.  SLP Short Term Goal 3 (Week 1): Patient will utilize an increased vocal intensity at the word level with Mod A verbal cues to increase intelligibility to 75%.  SLP Short Term Goal 4 (Week 1): Patient will utilize call bell to request assistance with Max A multimodal cues in 50% of observable opportunities.  SLP Short Term Goal 5 (Week 1): Patient will identify 2 physical and 2 cognitive deficits with Mod A multimodal cues.  SLP Short Term Goal 6 (Week 1): Patient will utilize external memory aids to recall new, daily information with Mod A multimodal cues.   Skilled Therapeutic Interventions:  Pt was seen for skilled ST targeting goals for dysphagia and cognition.  Upon arrival, pt was reclined in bed, awake, lethargic, and requesting a sip of "regular water."  Pt was transferred to wheelchair with min assist to maximize attention, alertness, and swallowing safety during structured therapeutic tasks.  Pt required overall max assist verbal, visual, and tactile cues for sequencing and to initiate and complete basic self care tasks for oral care in a timely manner.  SLP followed behind pt's oral care for thoroughness.  Pt consumed trials of regular water via cup sips with mod verbal cues for use of throat clear and extra swallow, min verbal cues for rate and portion control.  Pt demonstrated weak cough  following 2 out of 7 cup sips.  Pt also presents with slightly congested, weak  cough prior to administration of water trials with decreased ability to orally expectorate secretions.  Recommend that pt remain on nectar thick liquids.   Pt returned demonstration of call bell with min verbal cues and was able to identify 1 physical deficit occurring s/p CVA with min verbal cues.   Pt required mod-max assist verbal cues to identify cognitive changes s/p CVA.  Pt left in bed with bed alarm activated and nursing in room.  Continue per current plan of care.   FIM:  Comprehension Comprehension Mode: Auditory Comprehension: 4-Understands basic 75 - 89% of the time/requires cueing 10 - 24% of the time Expression Expression Mode: Verbal Expression: 3-Expresses basic 50 - 74% of the time/requires cueing 25 - 50% of the time. Needs to repeat parts of sentences. Social Interaction Social Interaction: 4-Interacts appropriately 75 - 89% of the time - Needs redirection for appropriate language or to initiate interaction. Problem Solving Problem Solving: 3-Solves basic 50 - 74% of the time/requires cueing 25 - 49% of the time Memory Memory: 2-Recognizes or recalls 25 - 49% of the time/requires cueing 51 - 75% of the time FIM - Eating Eating Activity: 5: Supervision/cues  Pain Pain Assessment Pain Assessment: No/denies pain  Therapy/Group: Individual Therapy  Deni Berti, Selinda Orion 02/04/2015, 12:45 PM

## 2015-02-04 NOTE — IPOC Note (Signed)
Overall Plan of Care [facility]) Patient Details Name: Timothy Lambert MRN: [id number] DOB: September 07, 1956  Admitting Diagnosis: r cva seizures  Hospital Problems: Principal Problem:   Embolic cerebral infarction Active Problems:   STEMI (ST elevation myocardial infarction)   Altered mental status   Dysphagia   Diabetes type 2, uncontrolled     Functional Problem List: Nursing Bladder, Bowel, Safety, Pain  PT Balance, Endurance, Safety  OT Balance, Behavior, Cognition, Endurance, Motor, Safety  SLP Cognition, Safety  TR         Basic ADL's: OT Grooming, Bathing, Dressing, Toileting     Advanced  ADL's: OT Pam Drown Housekeeping     Transfers: PT Bed Mobility, Bed to Chair, Teacher, early years/pre, Tub/Shower     Locomotion: PT Stairs, Emergency planning/management officer, Ambulation     Additional Impairments: OT None  SLP Swallowing, Communication, Social Cognition expression Social Interaction, Awareness, Problem Solving, Memory, Attention  TR      Anticipated Outcomes Item Anticipated Outcome  Self Feeding independent  Swallowing  Supervision   Basic self-care  supervision  Toileting  supervision   Bathroom Transfers supervision  Bowel/Bladder  Continent to bowel and bladder with mod. Assisst.  Transfers  S transfers  Locomotion  S ambulation, mod I w/c mobility, S for stairs  Communication  Supervision  Cognition  Min A  Pain  Less than 3 on scale 1 to 10.  Safety/Judgment  Free from falls during his stay in rehab.   Therapy Plan: PT Intensity: Minimum of 1-2 x/day ,45 to 90 minutes PT Frequency: 5 out of 7 days PT Duration Estimated Length of Stay: 12 to 14 days OT Intensity: Minimum of 1-2 x/day, 45 to 90 minutes OT Frequency: 5 out of 7 days OT Duration/Estimated Length of Stay: 12-14 days SLP Intensity: Minumum of 1-2 x/day, 30 to 90 minutes SLP Frequency: 3 to 5 out of 7 days SLP Duration/Estimated Length of Stay: 12-14 days        Team  Interventions: Nursing Interventions Patient/Family Education, Bladder Management, Bowel Management, Disease Management/Prevention  PT interventions Ambulation/gait training, Balance/vestibular training, Disease management/prevention, Discharge planning, DME/adaptive equipment instruction, Functional electrical stimulation, Functional mobility training, Patient/family education, Neuromuscular re-education, Splinting/orthotics, Therapeutic Exercise, UE/LE Coordination activities, Visual/perceptual remediation/compensation, Therapeutic Activities, Stair training, UE/LE Strength taining/ROM, Wheelchair propulsion/positioning  OT Interventions Balance/vestibular training, Cognitive remediation/compensation, Discharge planning, DME/adaptive equipment instruction, Functional mobility training, Patient/family education, Self Care/advanced ADL retraining, Therapeutic Activities, Therapeutic Exercise, UE/LE Strength taining/ROM  SLP Interventions Cognitive remediation/compensation, Cueing hierarchy, Dysphagia/aspiration precaution training, Environmental controls, Internal/external aids, Speech/Language facilitation, Patient/family education, Therapeutic Activities, Functional tasks  TR Interventions    SW/CM Interventions      Team Discharge Planning: Destination: PT-Home ,OT- Home , SLP-Home Projected Follow-up: PT-Home health PT, OT-  Home health OT, 24 hour supervision/assistance, SLP-Home Health SLP, Outpatient SLP, 24 hour supervision/assistance Projected Equipment Needs: PT-To be determined, OT- Tub/shower bench, SLP-To be determined Equipment Details: PT- , OT-  Patient/family involved in discharge planning: PT- Patient, Family member/caregiver,  OT-Family Midwife, Patient, SLP-Patient, Family member/caregiver  MD ELOS: 16-22d  Medical Rehab Prognosis:  Excellent Assessment: 59 y.o. right handed male with history of hypertension, alcohol abuse, diabetes mellitus and coronary artery disease  maintained on aspirin. Patient lives with his wife and independent up until a few weeks ago. Presented to [facility] 01/23/2015 with seizures. Extremely agitated requiring intubation for airway protection. Alcohol level and drug screen negative. CT of the head negative for acute changes. EKG with precordial ST  changes concerning for ischemia/infarct. [facility]. After arriving to [facility] repeat EKG with worsened ST elevations and T-wave inversions troponin greater than 65. Also noted blood pressure in the high 200s over 100s and glucose in the 400s. Underwent emergent cardiac catheterization findings of 99% LAD stenosis with stenting. He was also fitted with a life vest. Echocardiogram with ejection fraction [id number] grade 1 diastolic dysfunction. MRI 01/24/2015 showing multiple small foci of acute ischemia spanning multiple vascular territories consistent with embolic event.   Now requiring 24/7 Rehab RN,MD, as well as CIR level PT, OT and SLP.  Treatment team will focus on ADLs and mobility with goals set at Sup  See Team Conference Notes for weekly updates to the plan of care

## 2015-02-04 NOTE — Progress Notes (Signed)
Patient information reviewed and entered into eRehab system by Sybil Shrader, RN, CRRN, PPS Coordinator.  Information including medical coding and functional independence measure will be reviewed and updated through discharge.    

## 2015-02-04 NOTE — Progress Notes (Signed)
[facility] PHYSICAL MEDICINE & REHABILITATION     PROGRESS NOTE 59 y.o. right handed male with history of hypertension, alcohol abuse, diabetes mellitus and coronary artery disease maintained on aspirin. Patient lives with his wife and independent up until a few weeks ago. Presented to [facility] 01/23/2015 with seizures. Extremely agitated requiring intubation for airway protection. Alcohol level and drug screen negative. CT of the head negative for acute changes. EKG with precordial ST changes concerning for ischemia/infarct. [facility]. After arriving to [facility] repeat EKG with worsened ST elevations and T-wave inversions troponin greater than 65. Also noted blood pressure in the high 200s over 100s and glucose in the 400s. Underwent emergent cardiac catheterization findings of 99% LAD stenosis with stenting. He was also fitted with a life vest. Echocardiogram with ejection fraction [id number] grade 1 diastolic dysfunction. MRI 01/24/2015 showing multiple small foci of acute ischemia spanning multiple vascular territories consistent with embolic event.   Subjective/Complaints: Attempts to get OOB bed noted. No apparent problems overnight.   Objective: Vital Signs: Blood pressure 157/82, pulse 92, temperature 98.8 F (37.1 C), temperature source Oral, resp. rate 18, weight 64.4 kg (141 lb 15.6 oz), SpO2 99 %. No results found.  Recent Labs  02/04/15 0640  WBC 18.9*  HGB 11.8*  HCT 34.0*  PLT 405*    Recent Labs  02/04/15 0640  NA 140  K 4.9  CL 104  GLUCOSE 188*  BUN 45*  CREATININE 1.55*  CALCIUM 9.2   CBG (last 3)   Recent Labs  02/04/15 0040 02/04/15 0357 02/04/15 0652  GLUCAP 157* 154* 155*    Wt Readings from Last 3 Encounters:  02/02/15 64.4 kg (141 lb 15.6 oz)  02/01/15 64.2 kg (141 lb 8.6 oz)    Physical Exam:  HENT:  Head: Normocephalic.  Eyes:  Pupil reactive to light   Neck: Normal range of motion. Neck  supple. No thyromegaly present.  Dysphonic voice persistent Cardiovascular: Normal rate and regular rhythm.  Respiratory: Effort normal and breath sounds normal. No respiratory distress.  GI: Soft. Bowel sounds are normal. He exhibits no distension.  Neurological:  Patient is alert, less restless and but still impulsive.  Limited awareness of his deficits. Followed simple commands. Moves all 4's with at least 4/5 strength proximal to distal in upper/lower limbs.  Difficult to assess visual fields looks like bitemporal field cut but cooperation with testing is questionable Skin: Skin is warm and dry.  Psychiatric:  Pleasant and cooperative in general   Assessment/Plan: 1. 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 Physiatrist is providing close team supervision and 24 hour management of active medical problems listed below. Physiatrist and rehab team continue to assess barriers to discharge/monitor patient progress toward functional and medical goals. FIM: FIM - Bathing Bathing Steps Patient Completed: Chest, Right Arm, Left Arm, Abdomen, Buttocks, Right upper leg, Left upper leg Bathing: 3: Mod-Patient completes 5-7 61f 10 parts or 50-74%  FIM - Upper Body Dressing/Undressing Upper body dressing/undressing steps patient completed: Thread/unthread right sleeve of front closure shirt/dress, Button/unbutton shirt Upper body dressing/undressing: 2: Max-Patient completed 25-49% of tasks FIM - Lower Body Dressing/Undressing Lower body dressing/undressing steps patient completed: Thread/unthread left pants leg, Pull pants up/down Lower body dressing/undressing: 2: Max-Patient completed 25-49% of tasks  FIM - Toileting Toileting steps completed by patient: Performs perineal hygiene Toileting Assistive Devices: Grab bar  or rail for support Toileting: 2: Max-Patient completed  1 of 3 steps  FIM - Radio producer Devices: Grab bars, Insurance account manager Transfers: 3-To toilet/BSC: Mod A (lift or lower assist), 3-From toilet/BSC: Mod A (lift or lower assist)  FIM - Control and instrumentation engineer Devices:  (head of bed flat, no rails) Bed/Chair Transfer: 3: Supine > Sit: Mod A (lifting assist/Pt. 50-74%/lift 2 legs, 4: Sit > Supine: Min A (steadying pt. > 75%/lift 1 leg), 4: Bed > Chair or W/C: Min A (steadying Pt. > 75%), 4: Chair or W/C > Bed: Min A (steadying Pt. > 75%)  FIM - Locomotion: Wheelchair Distance: 125 Locomotion: Wheelchair: 2: Travels 50 - 149 ft with supervision, cueing or coaxing FIM - Locomotion: Ambulation Locomotion: Ambulation Assistive Devices: Other (comment), Walker - Rolling (no assistive device) Ambulation/Gait Assistance: 3: Mod assist Locomotion: Ambulation: 2: Travels 50 - 149 ft with minimal assistance (Pt.>75%)  Comprehension Comprehension Mode: Auditory Comprehension: 4-Understands basic 75 - 89% of the time/requires cueing 10 - 24% of the time  Expression Expression Mode: Verbal Expression: 3-Expresses basic 50 - 74% of the time/requires cueing 25 - 50% of the time. Needs to repeat parts of sentences.  Social Interaction Social Interaction: 4-Interacts appropriately 75 - 89% of the time - Needs redirection for appropriate language or to initiate interaction.  Problem Solving Problem Solving: 2-Solves basic 25 - 49% of the time - needs direction more than half the time to initiate, plan or complete simple activities  Memory Memory: 3-Recognizes or recalls 50 - 74% of the time/requires cueing 25 - 49% of the time Medical Problem List and Plan: 1. Functional deficits secondary to embolic CVA/ s/pSTEMI status post stenting. Fitted with LifeVest  -hold on showering for the weekend at least. 2. DVT Prophylaxis/Anticoagulation: Aspirin/Brilinta as well as SCDs. Monitor for any signs of  DVT 3. Pain Management: Tylenol as needed 4. Seizure disorder. Keppra 500 mg twice a day. EEG negative. 5. Neuropsych: This patient is capable of making decisions on his own behalf.  -bed alarm, ?mittens for safety---close observation/safety plan 6. Skin/Wound Care: Routine skin checks 7. Fluids/Electrolytes/Nutrition: Routine I&O's with follow-up chemistries  -inadequate intake presently 8. Dysphagia. Dysphagia 3 nectar liquids. Follow-up speech therapy 9. Hypertension. Coreg 25 mg twice a day, lisinopril 10 mg daily. Monitor with increased mobility 10. Diabetes mellitus and peripheral neuropathy. Hemoglobin A1c 9.3. Lantus insulin 14 units daily. Check blood sugars before meals and at bedtime.  -sugars poorly controlled at present.   -increase lantus to 24u qam  (on 14u at home) 11. Alcohol abuse. Monitor for any signs of withdrawal provide counseling 12. Hyperlipidemia. Lipitor   LOS (Days) 3 A FACE TO FACE EVALUATION WAS PERFORMED  Charlett Blake 02/04/2015 7:53 AM

## 2015-02-05 ENCOUNTER — Inpatient Hospital Stay (HOSPITAL_COMMUNITY): Payer: Self-pay | Admitting: Physical Therapy

## 2015-02-05 ENCOUNTER — Inpatient Hospital Stay (HOSPITAL_COMMUNITY): Payer: Self-pay | Admitting: Occupational Therapy

## 2015-02-05 ENCOUNTER — Inpatient Hospital Stay (HOSPITAL_COMMUNITY): Payer: Self-pay | Admitting: Speech Pathology

## 2015-02-05 ENCOUNTER — Inpatient Hospital Stay (HOSPITAL_COMMUNITY): Payer: Medicaid Other | Admitting: Occupational Therapy

## 2015-02-05 LAB — GLUCOSE, CAPILLARY
Glucose-Capillary: 140 mg/dL — ABNORMAL HIGH (ref 65–99)
Glucose-Capillary: 302 mg/dL — ABNORMAL HIGH (ref 65–99)
Glucose-Capillary: 223 mg/dL — ABNORMAL HIGH (ref 65–99)
Glucose-Capillary: 163 mg/dL — ABNORMAL HIGH (ref 65–99)

## 2015-02-05 MED ORDER — GUAIFENESIN 100 MG/5ML PO SYRP
100.0000 mg | ORAL_SOLUTION | Freq: Two times a day (BID) | ORAL | Status: DC
  Administered 2015-02-05 – 2015-02-14 (×19): 100 mg via ORAL
  Filled 2015-02-05 (×22): qty 5

## 2015-02-05 NOTE — Progress Notes (Signed)
Occupational Therapy Session Note  Patient Details  Name: Timothy Lambert MRN: [id number] Date of Birth: 03-17-1956  Today's Date: 02/05/2015 OT Individual Time: 1000-1100 OT Individual Time Calculation (min): 60 min    Short Term Goals: Week 1:  OT Short Term Goal 1 (Week 1): Pt. will bathe self at supervision level OT Short Term Goal 2 (Week 1): Pt. will dress self at supervision level OT Short Term Goal 3 (Week 1): Pt. will show better impulse control by not getting up w/o supervision. OT Short Term Goal 4 (Week 1): pt will transfer to toilet with SBA OT Short Term Goal 5 (Week 1): Pt will transfer to shower bench with SBA  Skilled Therapeutic Interventions/Progress Updates:    Engaged in therapeutic activity with focus on standing balance and activity tolerance.  Pt with no clothes this session and refusing to bathe at this time.  Pt reports fatigue and requesting multiple rest breaks throughout session.  Engaged in scanning activity at sit > stand level incorporating reaching outside BOS.  Pt required mod cues for attention to task and initiation.  Vitals assessed during session due to decreased activity tolerance and increase in fatigue.  Continued to participate with increased encouragement and rest breaks.  Pt returned to room at end of session and left in bed with bed alarm on.  Therapy Documentation Precautions:  Precautions Precautions: Fall, Other (comment) Precaution Comments: Zoll Lifevest Restrictions Weight Bearing Restrictions: No General:   Vital Signs: Therapy Vitals Pulse Rate: 97 BP: 100/60 mmHg Patient Position (if appropriate): Lying Oxygen Therapy SpO2: 100 % O2 Device: Not Delivered Pain: Pain Assessment Pain Assessment: No/denies pain  See FIM for current functional status  Therapy/Group: Individual Therapy  Simonne Come 02/05/2015, 12:03 PM

## 2015-02-05 NOTE — Progress Notes (Signed)
Speech Language Pathology Daily Session Note  Patient Details  Name: Timothy Lambert MRN: [id number] Date of Birth: 1956-07-01  Today's Date: 02/05/2015 SLP Individual Time: 0900-1000 SLP Individual Time Calculation (min): 60 min  Short Term Goals: Week 1: SLP Short Term Goal 1 (Week 1): Patient will consume current diet with minimal overt s/s of aspiration with Mod A verbal cues for use of swallow strategies.  SLP Short Term Goal 2 (Week 1): Patient will consume trials of thin liquids without overt s/s of aspiration to assess readiness for repeat objective swallow study with Mod A verbal cues.  SLP Short Term Goal 3 (Week 1): Patient will utilize an increased vocal intensity at the word level with Mod A verbal cues to increase intelligibility to 75%.  SLP Short Term Goal 4 (Week 1): Patient will utilize call bell to request assistance with Max A multimodal cues in 50% of observable opportunities.  SLP Short Term Goal 5 (Week 1): Patient will identify 2 physical and 2 cognitive deficits with Mod A multimodal cues.  SLP Short Term Goal 6 (Week 1): Patient will utilize external memory aids to recall new, daily information with Mod A multimodal cues.   Skilled Therapeutic Interventions:  Pt was seen for skilled ST targeting cognitive goals.  Upon arrival, pt was seated upright in wheelchair with RN present.  Pt was awake, alert, and initially reported that he didn't want to do anything for therapy.  He was amenable to participating in therapies with minimal encouragement.  SLP facilitated the session with a basic money management task targeting sustained attention and functional problem solving.  Pt sorted coins into groups by value with min assist verbal cues.  He initially required mod-max assist verbal and visual cues to count money and generate values when named; however, SLP was able to fade cuing to min assist verbal cues with interventions for use of organizational aids and with repetition of  trials.  SLP also facilitated the session with a structured new learning activity.  Pt sustained attention to task for 2-3 minute intervals before requiring min cues for redirection.  He also located matches during the abovementioned activity with max assist verbal and visual cues for working memory, which SLP was able to fade to min assist verbal cues with increased task familiarity.  Of note, pt demonstrated slightly congested cough throughout therapy session which was nonproductive. SLP administered presentations of nectar thick liquids to assess diet toleration with pt requiring min assist verbal and tactile cues for rate and portion control, no overt s/s of aspiration evident.  Continue per current plan of care.    FIM:  Comprehension Comprehension Mode: Auditory Comprehension: 4-Understands basic 75 - 89% of the time/requires cueing 10 - 24% of the time Expression Expression Mode: Verbal Expression: 3-Expresses basic 50 - 74% of the time/requires cueing 25 - 50% of the time. Needs to repeat parts of sentences. Social Interaction Social Interaction: 4-Interacts appropriately 75 - 89% of the time - Needs redirection for appropriate language or to initiate interaction. Problem Solving Problem Solving: 3-Solves basic 50 - 74% of the time/requires cueing 25 - 49% of the time Memory Memory: 2-Recognizes or recalls 25 - 49% of the time/requires cueing 51 - 75% of the time FIM - Eating Eating Activity: 5: Needs verbal cues/supervision  Pain Pain Assessment Pain Assessment: No/denies pain  Therapy/Group: Individual Therapy  Hibba Schram, Selinda Orion 02/05/2015, 4:28 PM

## 2015-02-05 NOTE — Progress Notes (Signed)
Occupational Therapy Session Note  Patient Details  Name: Timothy Lambert MRN: [id number] Date of Birth: 07-10-1956  Today's Date: 02/05/2015 OT Individual Time: 1130-1200 OT Individual Time Calculation (min): 30 min    Short Term Goals: Week 1:  OT Short Term Goal 1 (Week 1): Pt. will bathe self at supervision level OT Short Term Goal 2 (Week 1): Pt. will dress self at supervision level OT Short Term Goal 3 (Week 1): Pt. will show better impulse control by not getting up w/o supervision. OT Short Term Goal 4 (Week 1): pt will transfer to toilet with SBA OT Short Term Goal 5 (Week 1): Pt will transfer to shower bench with SBA  Skilled Therapeutic Interventions/Progress Updates:    1:1 Pt initially wanted to decline participation in [place] however using therapeutic use of self able to engage pt in cognitive tasks and simple self care tasks in prep for eating lunch. Pt demonstrated intellectual awareness with mod questioning cues of deficits but required max cuing for impact on his ability to complete tasks at home. Participated in ordering dinner using menu for choices. Pt required max cuing for time of day and day of the week in order to choose from the choices for tonight's meal. Pt then transferred with close supervision into the w/c and transitioned to in front of the sink. Pt stood to wash hands in prep for lunch with steady A. Took pt to RN station in prep for supervised lunch with safety belt on.   Therapy Documentation Precautions:  Precautions Precautions: Fall, Other (comment) Precaution Comments: Zoll Lifevest Restrictions Weight Bearing Restrictions: No Pain: Pain Assessment Pain Assessment: No/denies pain  See FIM for current functional status  Therapy/Group: Individual Therapy  [name] [facility] 02/05/2015, 12:02 PM

## 2015-02-05 NOTE — Progress Notes (Signed)
Inpatient Diabetes Program Recommendations  AACE/ADA: New Consensus Statement on Inpatient Glycemic Control (2013)  Target Ranges:  Prepandial:   less than 140 mg/dL      Peak postprandial:   less than 180 mg/dL (1-2 hours)      Critically ill patients:  140 - 180 mg/dL   Inpatient Diabetes Program Recommendations Insulin - Basal: Noted Lantus increased to 24 units from 14 units daily. However, it appears from the cbg patterns that initiallly the 14 units was effective as the fasting was normal, but the elevations began following breakfast, lunch and supper .  Correction (SSI): xxx Insulin - Meal Coverage: Cbg's elevated following each meal even though intake is documented sometimes with less than 50%-  Please add meal coverage of 4 units tidwc in addition to the present correction scale.l  Thank you Rosita Kea, RN, MSN, CDE  Diabetes Inpatient Program Office: [phone] Pager: [phone] 8:00 am to 5:00 pm

## 2015-02-05 NOTE — Progress Notes (Signed)
[facility] PHYSICAL MEDICINE & REHABILITATION     PROGRESS NOTE 59 y.o. right handed male with history of hypertension, alcohol abuse, diabetes mellitus and coronary artery disease maintained on aspirin. Patient lives with his wife and independent up until a few weeks ago. Presented to [facility] 01/23/2015 with seizures. Extremely agitated requiring intubation for airway protection. Alcohol level and drug screen negative. CT of the head negative for acute changes. EKG with precordial ST changes concerning for ischemia/infarct. Transferred to [facility] for ongoing care. After arriving to [facility] repeat EKG with worsened ST elevations and T-wave inversions troponin greater than 65. Also noted blood pressure in the high 200s over 100s and glucose in the 400s. Underwent emergent cardiac catheterization findings of 99% LAD stenosis with stenting. He was also fitted with a life vest. Echocardiogram with ejection fraction [id number] grade 1 diastolic dysfunction. MRI 01/24/2015 showing multiple small foci of acute ischemia spanning multiple vascular territories consistent with embolic event.   Subjective/Complaints: No issues overnite still coughing Asking for thin water  ROS:   No SOB, No CP, no abd pain  Objective: Vital Signs: Blood pressure 137/80, pulse 85, temperature 98.6 F (37 C), temperature source Oral, resp. rate 18, weight 64.4 kg (141 lb 15.6 oz), SpO2 98 %. No results found.  Recent Labs  02/04/15 0640  WBC 18.9*  HGB 11.8*  HCT 34.0*  PLT 405*    Recent Labs  02/04/15 0640  NA 140  K 4.9  CL 104  GLUCOSE 188*  BUN 45*  CREATININE 1.55*  CALCIUM 9.2   CBG (last 3)   Recent Labs  02/04/15 1609 02/04/15 2047 02/05/15 0636  GLUCAP 173* 185* 140*    Wt Readings from Last 3 Encounters:  02/02/15 64.4 kg (141 lb 15.6 oz)  02/01/15 64.2 kg (141 lb 8.6 oz)    Physical Exam:  HENT:  Head: Normocephalic.  Eyes:  Pupil reactive to light   Neck: Normal  range of motion. Neck supple. No thyromegaly present.  Dysphonic voice persistent Cardiovascular: Normal rate and regular rhythm.  Respiratory: Effort normal and breath sounds normal. No respiratory distress.  GI: Soft. Bowel sounds are normal. He exhibits no distension.  Neurological:  Patient is alert, less restless and but still impulsive.  Limited awareness of his deficits. Followed simple commands. Moves all 4's with at least 4/5 strength proximal to distal in upper/lower limbs.  Difficult to assess visual fields looks like bitemporal field cut but cooperation with testing is questionable Skin: Skin is warm and dry.  Psychiatric:  Pleasant and cooperative in general   Assessment/Plan: 1. 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 Physiatrist is providing close team supervision and 24 hour management of active medical problems listed below. Physiatrist and rehab team continue to assess barriers to discharge/monitor patient progress toward functional and medical goals. FIM: FIM - Bathing Bathing Steps Patient Completed: Chest, Right Arm, Left Arm, Abdomen, Buttocks, Right upper leg, Left upper leg Bathing: 3: Mod-Patient completes 5-7 52f 10 parts or 50-74%  FIM - Upper Body Dressing/Undressing Upper body dressing/undressing steps patient completed: Thread/unthread right sleeve of front closure shirt/dress, Button/unbutton shirt Upper body dressing/undressing: 2: Max-Patient completed 25-49% of tasks FIM - Lower Body Dressing/Undressing Lower body dressing/undressing steps patient completed: Don/Doff right sock, Don/Doff left sock, Don/Doff right shoe, Don/Doff left shoe, Fasten/unfasten right shoe, Fasten/unfasten left shoe Lower body dressing/undressing: 3: Mod-Patient completed  50-74% of tasks  FIM - Toileting Toileting steps completed by patient: Performs  perineal hygiene Toileting Assistive Devices: Grab bar or rail for support Toileting: 2: Max-Patient completed 1 of 3 steps  FIM - Radio producer Devices: Grab bars, Insurance account manager Transfers: 3-To toilet/BSC: Mod A (lift or lower assist), 3-From toilet/BSC: Mod A (lift or lower assist)  FIM - Control and instrumentation engineer Devices: Bed rails Bed/Chair Transfer: 4: Chair or W/C > Bed: Min A (steadying Pt. > 75%), 4: Bed > Chair or W/C: Min A (steadying Pt. > 75%), 4: Supine > Sit: Min A (steadying Pt. > 75%/lift 1 leg), 5: Sit > Supine: Supervision (verbal cues/safety issues)  FIM - Locomotion: Wheelchair Distance: 100 Locomotion: Wheelchair: 2: Travels 50 - 149 ft with supervision, cueing or coaxing FIM - Locomotion: Ambulation Locomotion: Ambulation Assistive Devices: Administrator Ambulation/Gait Assistance: 3: Mod assist Locomotion: Ambulation: 2: Travels 50 - 149 ft with moderate assistance (Pt: 50 - 74%)  Comprehension Comprehension Mode: Auditory Comprehension: 4-Understands basic 75 - 89% of the time/requires cueing 10 - 24% of the time  Expression Expression Mode: Verbal Expression: 3-Expresses basic 50 - 74% of the time/requires cueing 25 - 50% of the time. Needs to repeat parts of sentences.  Social Interaction Social Interaction: 4-Interacts appropriately 75 - 89% of the time - Needs redirection for appropriate language or to initiate interaction.  Problem Solving Problem Solving: 3-Solves basic 50 - 74% of the time/requires cueing 25 - 49% of the time  Memory Memory: 2-Recognizes or recalls 25 - 49% of the time/requires cueing 51 - 75% of the time Medical Problem List and Plan: 1. Functional deficits secondary to embolic CVA/ s/pSTEMI status post stenting. Fitted with LifeVest  -hold on showering for the weekend at least. 2. DVT Prophylaxis/Anticoagulation: Aspirin/Brilinta as well as SCDs. Monitor for any signs of  DVT 3. Pain Management: Tylenol as needed 4. Seizure disorder. Keppra 500 mg twice a day. EEG negative. 5. Neuropsych: This patient is capable of making decisions on his own behalf.  -bed alarm, ?mittens for safety---close observation/safety plan 6. Skin/Wound Care: Routine skin checks 7. Fluids/Electrolytes/Nutrition: Routine I&O's with follow-up chemistries  -inadequate intake presently 8. Dysphagia. Dysphagia 3 nectar liquids. Follow-up speech therapy, cough is dysphagia related do not suspect PNA at this point 9. Hypertension. Coreg 25 mg twice a day, lisinopril 10 mg daily. Monitor with increased mobility 10. Diabetes mellitus and peripheral neuropathy. Hemoglobin A1c 9.3. Lantus insulin 14 units daily. Check blood sugars before meals and at bedtime.  -sugars poorly controlled at present.   -increase lantus to 24u qam  (on 14u at home) 11. Alcohol abuse. Monitor for any signs of withdrawal provide counseling 12. Hyperlipidemia. Lipitor   LOS (Days) 4 A FACE TO FACE EVALUATION WAS PERFORMED  Charlett Blake 02/05/2015 8:13 AM

## 2015-02-05 NOTE — Progress Notes (Signed)
Physical Therapy Session Note  Patient Details  Name: Timothy Lambert MRN: [id number] Date of Birth: Feb 03, 1956  Today's Date: 02/05/2015 PT Individual Time: 1400-1500 PT Individual Time Calculation (min): 60 min   Short Term Goals: Week 1:  PT Short Term Goal 1 (Week 1): Pt will increase bed mobility supine to edge of bed with min A, edge of bed to supine with S. PT Short Term Goal 2 (Week 1): Pt will increase transfers bed to chair, chair to bed with min guard.  PT Short Term Goal 3 (Week 1): Pt will ambulate 150 feet with LRAD and min guard.  PT Short Term Goal 4 (Week 1): Pt will ascend/descend 1 step with no rails and min A.  PT Short Term Goal 5 (Week 1): Pt will propel w/c about 150 feet with S.   Skilled Therapeutic Interventions/Progress Updates:   Pt received in bed asleep with wife present.  Pt required increased time, verbal and tactile cues to awaken fully and required increased time and encouragement to participate in therapy and to initiate transfer to EOB secondary to fatigue.  Pt transferred to EOB with min A and donned socks and shoes with supervision but verbal cues for initiation and sequencing; pt demonstrating perseveration with attempting to don shoe on top of shoe on R foot despite repeated cues to don on L foot.  Performed transfer to w/c stand pivot min A.  In gym performed BERG balance assessment; see below for details.  Educated pt and wife on high falls risk.  Performed gait x 75' in controlled environment with just mod HHA with pt demonstrating narrow BOS, decreased step length and lateral LOB with facilitation needed for lateral weight shifting to L.  Also performed one step negotiation training for home entry/exit with HHA and mod A for balance and sequencing.  Returned to room and pt transferred back to bed and to supine with min A.  Pt left with wife present, bed alarm set and all items within reach.  Therapy Documentation Precautions:  Precautions Precautions:  Fall, Other (comment) Precaution Comments: [employer] Lifevest Restrictions Weight Bearing Restrictions: No Vital Signs: Therapy Vitals Temp: 98.1 F (36.7 C) Pulse Rate: 94 Resp: 18 BP: (!) 104/59 mmHg Patient Position (if appropriate): Lying Oxygen Therapy SpO2: 100 % O2 Device: Not Delivered Pain: Pain Assessment Pain Assessment: No/denies pain Locomotion : Ambulation Ambulation/Gait Assistance: 3: Mod assist   Balance: Standardized Balance Assessment Standardized Balance Assessment: Berg Balance Test Berg Balance Test Sit to Stand: Able to stand  independently using hands Standing Unsupported: Able to stand 2 minutes with supervision Sitting with Back Unsupported but Feet Supported on Floor or Stool: Able to sit safely and securely 2 minutes Stand to Sit: Controls descent by using hands Transfers: Able to transfer safely, definite need of hands Standing Unsupported with Eyes Closed: Able to stand 10 seconds with supervision Standing Ubsupported with Feet Together: Needs help to attain position and unable to hold for 15 seconds From Standing, Reach Forward with Outstretched Arm: Can reach forward >12 cm safely (5") From Standing Position, Pick up Object from Floor: Able to pick up shoe, needs supervision From Standing Position, Turn to Look Behind Over each Shoulder: Turn sideways only but maintains balance Turn 360 Degrees: Needs assistance while turning Standing Unsupported, Alternately Place Feet on Step/Stool: Able to complete >2 steps/needs minimal assist Standing Unsupported, One Foot in Front: Able to take small step independently and hold 30 seconds Standing on One Leg: Unable to try or  needs assist to prevent fall Total Score: 30 Patient demonstrates increased fall risk as noted by score of 30/56 on Berg Balance Scale.  (<36= high risk for falls, close to 100%; 37-45 significant >80%; 46-51 moderate >50%; 52-55 lower >25%)  See FIM for current functional  status  Therapy/Group: Individual Therapy  Raylene Everts [facility] 02/05/2015, 5:37 PM

## 2015-02-06 ENCOUNTER — Inpatient Hospital Stay (HOSPITAL_COMMUNITY): Payer: Self-pay | Admitting: Speech Pathology

## 2015-02-06 ENCOUNTER — Inpatient Hospital Stay (HOSPITAL_COMMUNITY): Payer: Medicaid Other | Admitting: Occupational Therapy

## 2015-02-06 ENCOUNTER — Inpatient Hospital Stay (HOSPITAL_COMMUNITY): Payer: Medicaid Other | Admitting: Physical Therapy

## 2015-02-06 LAB — GLUCOSE, CAPILLARY
GLUCOSE-CAPILLARY: 269 mg/dL — AB (ref 65–99)
Glucose-Capillary: 155 mg/dL — ABNORMAL HIGH (ref 65–99)
Glucose-Capillary: 352 mg/dL — ABNORMAL HIGH (ref 65–99)
Glucose-Capillary: 170 mg/dL — ABNORMAL HIGH (ref 65–99)

## 2015-02-06 NOTE — Progress Notes (Signed)
Social Work Patient ID: Timothy Lambert, male   DOB: Mar 03, 1956, 59 y.o.   MRN: [id number] Met with pt and wife to discuss team conference goal-supervision-min level and discharge 5/26.  Both pleased with progress and target discharge date. Pt states: " Maybe I will get to go home." He doesn;t want to get his hopes up and then have some medical issue and not be able to go.  Wife to let supervisor Know she will not be back to work on Monday like she had thought, she will stay off and attend therapies with him to learn his care before going home. Will work with Them on discharge planning and will arrange PCP for pt prior to discharge.

## 2015-02-06 NOTE — Progress Notes (Signed)
[facility] PHYSICAL MEDICINE & REHABILITATION     PROGRESS NOTE 59 y.o. right handed male with history of hypertension, alcohol abuse, diabetes mellitus and coronary artery disease maintained on aspirin. Patient lives with his wife and independent up until a few weeks ago. Presented to [facility] 01/23/2015 with seizures. Extremely agitated requiring intubation for airway protection. Alcohol level and drug screen negative. CT of the head negative for acute changes. EKG with precordial ST changes concerning for ischemia/infarct. [facility]red [facility]. After arriving to [facility] repeat EKG with worsened ST elevations and T-wave inversions troponin greater than 65. Also noted blood pressure in the high 200s over 100s and glucose in the 400s. Underwent emergent cardiac catheterization findings of 99% LAD stenosis with stenting. He was also fitted with a life vest. Echocardiogram with ejection fraction [id number] grade 1 diastolic dysfunction. MRI 01/24/2015 showing multiple small foci of acute ischemia spanning multiple vascular territories consistent with embolic event.   Subjective/Complaints: Discussed coughing with SLP, this occurs outside of meals and I suspect it is related to handling his own secretions  ROS:   No SOB, No CP, no abd pain  Objective: Vital Signs: Blood pressure 148/78, pulse 87, temperature 98.8 F (37.1 C), temperature source Oral, resp. rate 18, weight 64.4 kg (141 lb 15.6 oz), SpO2 99 %. No results found.  Recent Labs  02/04/15 0640  WBC 18.9*  HGB 11.8*  HCT 34.0*  PLT 405*    Recent Labs  02/04/15 0640  NA 140  K 4.9  CL 104  GLUCOSE 188*  BUN 45*  CREATININE 1.55*  CALCIUM 9.2   CBG (last 3)   Recent Labs  02/05/15 1641 02/05/15 2052 02/06/15 0648  GLUCAP 223* 163* 155*    Wt Readings from Last 3 Encounters:  02/02/15 64.4 kg (141 lb 15.6 oz)  02/01/15 64.2 kg (141 lb 8.6 oz)    Physical Exam:  HENT:  Head:  Normocephalic.  Eyes:  Pupil reactive to light   Neck: Normal range of motion. Neck supple. No thyromegaly present.  Dysphonic voice persistent Cardiovascular: Normal rate and regular rhythm.  Respiratory: Effort normal and breath sounds normal. No respiratory distress.  GI: Soft. Bowel sounds are normal. He exhibits no distension.  Neurological:  Patient is alert, less restless and but still impulsive.  Limited awareness of his deficits. Followed simple commands. Moves all 4's with at least 4/5 strength proximal to distal in upper/lower limbs.  Difficult to assess visual fields looks like bitemporal field cut but cooperation with testing is questionable Skin: Skin is warm and dry.  Psychiatric:  Pleasant and cooperative in general   Assessment/Plan: 1. 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 Physiatrist is providing close team supervision and 24 hour management of active medical problems listed below. Physiatrist and rehab team continue to assess barriers to discharge/monitor patient progress toward functional and medical goals. Over half of the 25 min visit was spent counseling and coordinating care. FIM: FIM - Bathing Bathing Steps Patient Completed: Chest, Right Arm, Left Arm, Abdomen, Buttocks, Right upper leg, Left upper leg Bathing: 3: Mod-Patient completes 5-7 82f 10 parts or 50-74%  FIM - Upper Body Dressing/Undressing Upper body dressing/undressing steps patient completed: Thread/unthread right sleeve of front closure shirt/dress, Button/unbutton shirt Upper body dressing/undressing: 2: Max-Patient completed 25-49% of tasks FIM - Lower Body Dressing/Undressing Lower body dressing/undressing steps patient completed:  Don/Doff right sock, Don/Doff left sock, Don/Doff right shoe, Don/Doff left shoe, Fasten/unfasten right shoe, Fasten/unfasten left shoe Lower  body dressing/undressing: 3: Mod-Patient completed 50-74% of tasks  FIM - Toileting Toileting steps completed by patient: Performs perineal hygiene Toileting Assistive Devices: Grab bar or rail for support Toileting: 2: Max-Patient completed 1 of 3 steps  FIM - Radio producer Devices: Grab bars, Insurance account manager Transfers: 3-To toilet/BSC: Mod A (lift or lower assist), 3-From toilet/BSC: Mod A (lift or lower assist)  FIM - Control and instrumentation engineer Devices: Bed rails Bed/Chair Transfer: 4: Supine > Sit: Min A (steadying Pt. > 75%/lift 1 leg), 4: Sit > Supine: Min A (steadying pt. > 75%/lift 1 leg), 4: Bed > Chair or W/C: Min A (steadying Pt. > 75%), 4: Chair or W/C > Bed: Min A (steadying Pt. > 75%)  FIM - Locomotion: Wheelchair Distance: 100 Locomotion: Wheelchair: 1: Total Assistance/staff pushes wheelchair (Pt<25%) FIM - Locomotion: Ambulation Locomotion: Ambulation Assistive Devices: Administrator Ambulation/Gait Assistance: 3: Mod assist Locomotion: Ambulation: 2: Travels 50 - 149 ft with moderate assistance (Pt: 50 - 74%)  Comprehension Comprehension Mode: Auditory Comprehension: 4-Understands basic 75 - 89% of the time/requires cueing 10 - 24% of the time  Expression Expression Mode: Verbal Expression: 3-Expresses basic 50 - 74% of the time/requires cueing 25 - 50% of the time. Needs to repeat parts of sentences.  Social Interaction Social Interaction: 4-Interacts appropriately 75 - 89% of the time - Needs redirection for appropriate language or to initiate interaction.  Problem Solving Problem Solving: 3-Solves basic 50 - 74% of the time/requires cueing 25 - 49% of the time  Memory Memory: 2-Recognizes or recalls 25 - 49% of the time/requires cueing 51 - 75% of the time Medical Problem List and Plan: 1. Functional deficits secondary to embolic CVA/ s/pSTEMI status post stenting. Fitted with LifeVest  -hold on  showering , vest can come off for 96min, per OT bathing time is ~59min, pt is slow 2. DVT Prophylaxis/Anticoagulation: Aspirin/Brilinta as well as SCDs. Monitor for any signs of DVT 3. Pain Management: Tylenol as needed 4. Seizure disorder. Keppra 500 mg twice a day. EEG negative. 5. Neuropsych: This patient is capable of making decisions on his own behalf.  -bed alarm, ?mittens for safety---close observation/safety plan 6. Skin/Wound Care: Routine skin checks 7. Fluids/Electrolytes/Nutrition: Routine I&O's with follow-up chemistries  -inadequate intake presently 8. Dysphagia. Dysphagia 3 nectar liquids. Follow-up speech therapy, cough is dysphagia (trouble handling saliva) related do not suspect PNA at this point 9. Hypertension. Coreg 25 mg twice a day, lisinopril 10 mg daily. Monitor with increased mobility 10. Diabetes mellitus and peripheral neuropathy. Hemoglobin A1c 9.3. Lantus insulin 14 units daily. Check blood sugars before meals and at bedtime.  -sugars fairly controlled at present.   -increase lantus to 26u qam  (on 14u at home) 11. Alcohol abuse. Monitor for any signs of withdrawal provide counseling 12. Hyperlipidemia. Lipitor   LOS (Days) 5 A FACE TO FACE EVALUATION WAS PERFORMED  Charlett Blake 02/06/2015 9:40 AM

## 2015-02-06 NOTE — Telephone Encounter (Signed)
Pt noted to still be admitted to hospital. Will leave on triage desktop for follow up 02/07/15.

## 2015-02-06 NOTE — Progress Notes (Signed)
Speech Language Pathology Daily Session Note  Patient Details  Name: Timothy Lambert MRN: [id number] Date of Birth: [birth year]  Today's Date: 02/06/2015 SLP Individual Time: [id number] SLP Individual Time Calculation (min): 40 min  Short Term Goals: Week 1: SLP Short Term Goal 1 (Week 1): Patient will consume current diet with minimal overt s/s of aspiration with Mod A verbal cues for use of swallow strategies.  SLP Short Term Goal 2 (Week 1): Patient will consume trials of thin liquids without overt s/s of aspiration to assess readiness for repeat objective swallow study with Mod A verbal cues.  SLP Short Term Goal 3 (Week 1): Patient will utilize an increased vocal intensity at the word level with Mod A verbal cues to increase intelligibility to 75%.  SLP Short Term Goal 4 (Week 1): Patient will utilize call bell to request assistance with Max A multimodal cues in 50% of observable opportunities.  SLP Short Term Goal 5 (Week 1): Patient will identify 2 physical and 2 cognitive deficits with Mod A multimodal cues.  SLP Short Term Goal 6 (Week 1): Patient will utilize external memory aids to recall new, daily information with Mod A multimodal cues.   Skilled Therapeutic Interventions:  Pt was seen for skilled ST targeting family education.  Upon arrival, pt was seated upright in wheelchair with wife present.  SLP initiated skilled education regarding current goals and progress in ST.  Pt's wife presented with questions regarding pt's cough and whether pt will be advanced from thickened liquids.   SLP informed pt's wife that pt's cough is likely due to poor management of secretions as he is unable to orally expectorate secretions due to cognitive deficits and weak volitional cough.  Pt also provided education that, given pt's cough, a more conservative approach to diet advancement is warranted at this time.  Pt will be trialed on advanced consistencies per improved cough and management of secretions.   Pt's wife was educated regarding rationale behind currently prescribed diet and recommended swallowing precautions including pt's risk of aspiration.  Pt's wife endorses that she has been thickening pt's liquids.  SLP educated that nursing and staff only are to thicken liquids until wife has demonstrated competence in thickening liquids.  Wife verbalized understanding. SLP also provided extensive education regarding pt's current cognitive deficits and their impact on his swallowing and his ability to complete basic self care and home management tasks.  SLP informed pt's wife that he benefits from frequent cues for redirection and must have extra processing time during tasks to facilitate attention to functional tasks.  SLP strongly recommended that pt have 24/7 supervision and follow up speech therapy at discharge due to dysphagia and swallowing deficits.   Pt's wife verbalized understanding.  All pt's wife's questions were answered to her satisfaction at this time.  SLP will continue to follow up for education prior to discharge.  Continue per current plan of care.    FIM:  Comprehension Comprehension Mode: Auditory Comprehension: 4-Understands basic 75 - 89% of the time/requires cueing 10 - 24% of the time Expression Expression Mode: Verbal Expression: 3-Expresses basic 50 - 74% of the time/requires cueing 25 - 50% of the time. Needs to repeat parts of sentences. Social Interaction Social Interaction: 4-Interacts appropriately 75 - 89% of the time - Needs redirection for appropriate language or to initiate interaction. Problem Solving Problem Solving: 3-Solves basic 50 - 74% of the time/requires cueing 25 - 49% of the time Memory Memory: 2-Recognizes or recalls 25 - 49%  of the time/requires cueing 51 - 75% of the time  Pain Pain Assessment Pain Assessment: No/denies pain  Therapy/Group: Individual Therapy  Lexia Vandevender, Selinda Orion 02/06/2015, 3:52 PM

## 2015-02-06 NOTE — Plan of Care (Signed)
Problem: RH Balance Goal: LTG Patient will maintain dynamic standing balance (PT) LTG: Patient will maintain dynamic standing balance with assistance during mobility activities (PT) Goal added 02/06/15  Problem: RH Car Transfers Goal: LTG Patient will perform car transfers with assist (PT) LTG: Patient will perform car transfers with assistance (PT). Goal added 02/06/15  Problem: RH Furniture Transfers Goal: LTG Patient will perform furniture transfers w/assist (OT/PT LTG: Patient will perform furniture transfers with assistance (OT/PT). Goal added 02/06/15     Problem: RH Floor Transfers Goal: LTG Patient will perform floor transfers w/assist (PT) LTG: Patient will perform floor transfers with assistance (PT). Goal added 02/06/15

## 2015-02-06 NOTE — Progress Notes (Signed)
Occupational Therapy Session Note  Patient Details  Name: Timothy Lambert MRN: [id number] Date of Birth: Jul 03, 1956  Today's Date: 02/06/2015 OT Individual Time: [id number] OT Individual Time Calculation (min): 60 min    Short Term Goals: Week 1:  OT Short Term Goal 1 (Week 1): Pt. will bathe self at supervision level OT Short Term Goal 2 (Week 1): Pt. will dress self at supervision level OT Short Term Goal 3 (Week 1): Pt. will show better impulse control by not getting up w/o supervision. OT Short Term Goal 4 (Week 1): pt will transfer to toilet with SBA OT Short Term Goal 5 (Week 1): Pt will transfer to shower bench with SBA  Skilled Therapeutic Interventions/Progress Updates:    Engaged in ADL retraining with focus on sequencing and standing balance with self-care tasks of bathing and dressing.  Pt required increased time and lights turned on to increase arousal.  Min/steady assist stand pivot bed > w/c.  Bathing completed at sit > stand level at sink with pt demonstrating improved sit > stand and standing tolerance.  Pt with improved attention to task, initiation, and sequencing with bathing and dressing tasks, improvements with familiar, automatic tasks.  Pt setup for breakfast, passed off to nurse tech for supervision with self-feeding.    Therapy Documentation Precautions:  Precautions Precautions: Fall, Other (comment) Precaution Comments: Zoll Lifevest Restrictions Weight Bearing Restrictions: No General:   Vital Signs: Therapy Vitals Pulse Rate: 91 BP: 100/60 mmHg Pain:  Pt with no c/o pain  See FIM for current functional status  Therapy/Group: Individual Therapy  Simonne Come 02/06/2015, 10:41 AM

## 2015-02-06 NOTE — Plan of Care (Signed)
Problem: RH Bed Mobility Goal: LTG Patient will perform bed mobility with assist (PT) LTG: Patient will perform bed mobility with assistance, with/without cues (PT).  Upgraded 02/06/15  Problem: RH Wheelchair Mobility Goal: LTG Patient will propel w/c in controlled environment (PT) LTG: Patient will propel wheelchair in controlled environment, # of feet with assist (PT)  Outcome: Not Applicable Date Met:  06/28/11 Goal D/C 02/06/15; not appropriate due to pt ambulatory Goal: LTG Patient will propel w/c in home environment (PT) LTG: Patient will propel wheelchair in home environment, # of feet with assistance (PT).  Outcome: Not Applicable Date Met:  19/75/88 Goal D/C 02/06/15; pt ambulatory Goal: LTG Patient will propel w/c in community environment (PT) LTG: Patient will propel wheelchair in community environment, # of feet with assist (PT)  Outcome: Not Applicable Date Met:  32/54/98 Goal D/C 02/06/15; pt ambulatory

## 2015-02-06 NOTE — Patient Care Conference (Signed)
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 Metabolic encephalopathy   . CVA (cerebral vascular accident)   . Dysphagia   . ST elevation (STEMI) myocardial infarction involving left anterior descending coronary artery   . Acute combined systolic and diastolic congestive heart failure   . Paroxysmal atrial flutter   . Hypokalemia   . Diabetes type 2, uncontrolled   . HLD (hyperlipidemia)   . Altered mental status   . Hyperglycemia   . Seizure   . Acute embolic stroke   . Acute respiratory failure with hypoxia 01/24/2015  . Acute renal failure syndrome   . Acute respiratory failure   . Hypertensive emergency 01/23/2015  . STEMI (ST elevation myocardial infarction) 01/23/2015  . Acute MI anterior wall first episode care   . Altered mental state     Expected Discharge Date: Expected Discharge Date: 02/14/15  Team Members Present: Physician leading conference: Dr. Alysia Penna Social Worker Present: Ovidio Kin, LCSW Nurse Present: Heather Roberts, RN PT Present: Jorge Mandril, Renaye Rakers, PT OT Present: Simonne Come, Dorothyann Gibbs, OT SLP Present: Windell Moulding, SLP PPS Coordinator present : Daiva Nakayama, RN, CRRN     Current Status/Progress Goal Weekly Team Focus  Medical   Strength is good, cognition is poor, better with familiar tasks, coughs intermittently   Home d/c  Repetition, sequencing   Bowel/Bladder   continent of bowel and bladder LBM 5/17  continue continence      Swallow/Nutrition/ Hydration   Dys 3 textures, nectar thick liquids; full supervision for use of swallowing precautions   supervision   trials of advanced consistencies, use of swallowing precautions   ADL's   mod assist bathing, LB dressing, and bathroom transfers, max assist UB dressing.    supervision  attention, awareness, transfers, functional mobility, pt and familiy education   Mobility   Min-mod A overall  Supervision overall  Activity tolerance/endurance, balance, gait, cognition with mobility   Communication   mod assist for increased vocal intensity for intellgibility in phrases and sentences   supervision   increased vocal intensity    Safety/Cognition/ Behavioral Observations  mod-max assist for basic tasks   min assist for basic   attention to tasks, basic problem solving, recall of daily information   Pain   No complaints of pain/discomfort         Skin   (R) foot old scabbed areas-monitor q shift  continue to keep skin CDI  assess for skin breakdown      *See Care Plan and progress notes for long and short-term goals.  Barriers to Discharge: cognitive isseues    Possible Resolutions to Barriers:  See above, family ed    Discharge Planning/Teaching Needs:  Home with wife who is tyring to discuss with other family members 24 hr care-while she works. She has been in and observed pt in therapies. Applied  for disability yesterday      Team Discussion:  Goals-supervision/min level-cognitive issues, will need someone for safety issues. Slow to process, unsure if joking at times. Berg 30/56. Dys 3 nectar thick liquids-not always takes his medicines. Making progress in therapies  Revisions to Treatment Plan: None   Continued Need for Acute Rehabilitation Level of Care: The patient requires daily medical management by a physician with specialized training  in physical medicine and rehabilitation for the following conditions: Daily direction of a multidisciplinary physical rehabilitation program to ensure safe treatment while eliciting the highest outcome that is of practical value to the patient.: Yes Daily medical management of patient stability for increased activity during participation in an intensive rehabilitation regime.: Yes Daily analysis of laboratory values and/or radiology reports with any subsequent need for medication adjustment of medical intervention for : Neurological problems;Cardiac problems;Other  [name] 02/06/2015, 3:49 PM

## 2015-02-06 NOTE — Progress Notes (Signed)
Speech Language Pathology Daily Session Note  Patient Details  Name: ADALI CALAMIA MRN: [id number] Date of Birth: 09-Jan-1956  Today's Date: 02/06/2015 SLP Individual Time: [id number] SLP Individual Time Calculation (min): 60 min  Short Term Goals: Week 1: SLP Short Term Goal 1 (Week 1): Patient will consume current diet with minimal overt s/s of aspiration with Mod A verbal cues for use of swallow strategies.  SLP Short Term Goal 2 (Week 1): Patient will consume trials of thin liquids without overt s/s of aspiration to assess readiness for repeat objective swallow study with Mod A verbal cues.  SLP Short Term Goal 3 (Week 1): Patient will utilize an increased vocal intensity at the word level with Mod A verbal cues to increase intelligibility to 75%.  SLP Short Term Goal 4 (Week 1): Patient will utilize call bell to request assistance with Max A multimodal cues in 50% of observable opportunities.  SLP Short Term Goal 5 (Week 1): Patient will identify 2 physical and 2 cognitive deficits with Mod A multimodal cues.  SLP Short Term Goal 6 (Week 1): Patient will utilize external memory aids to recall new, daily information with Mod A multimodal cues.   Skilled Therapeutic Interventions:  Pt was seen for skilled ST targeting cognitive goals.  Upon arrival, pt was reclined in bed, asleep but easily awakened to voice and light touch and agreeable to participate in [place].  SLP transferred pt to wheelchair to maximize attention and alertness during structured therapeutic tasks.  SLP facilitated the session with a basic sequencing task targeting functional problem solving and sustained attention.  Pt put 4 sequencing pictures in order with max assist multimodal cues for organization, error, awareness, and correction of perseveration.  Pt sustained attention to task for ~2 minute intervals before requiring min verbal cues for redirection to task.  Pt remained lethargic throughout session and was noted with  mildly wet sounding respirations.  Pt also continues to present with a nonproductive cough and was unable to orally expectorate secretions despite max cuing from SLP.  No changes in vocal quality or increase in frequency or intensity of coughing was noted with cup sips of nectar thick liquids.  Pt required min verbal and tactile cues for rate and portion control.  SLP will continue to follow up for diet toleration and readiness for advancement, though pt does not appear appropriate for any upgrades to currently prescribed diet at this time.  Continue per current plan of care.     FIM:  Comprehension Comprehension Mode: Auditory Comprehension: 4-Understands basic 75 - 89% of the time/requires cueing 10 - 24% of the time Expression Expression Mode: Verbal Expression: 3-Expresses basic 50 - 74% of the time/requires cueing 25 - 50% of the time. Needs to repeat parts of sentences. Social Interaction Social Interaction: 4-Interacts appropriately 75 - 89% of the time - Needs redirection for appropriate language or to initiate interaction. Problem Solving Problem Solving: 3-Solves basic 50 - 74% of the time/requires cueing 25 - 49% of the time Memory Memory: 2-Recognizes or recalls 25 - 49% of the time/requires cueing 51 - 75% of the time  Pain Pain Assessment Pain Assessment: No/denies pain  Therapy/Group: Individual Therapy  Vallorie Niccoli, Selinda Orion 02/06/2015, 11:37 AM

## 2015-02-06 NOTE — Progress Notes (Signed)
Physical Therapy Session Note  Patient Details  Name: Timothy Lambert MRN: [id number] Date of Birth: 20-Sep-1956  Today's Date: 02/06/2015 PT Individual Time: 1410-1501 PT Individual Time Calculation (min): 51 min   Short Term Goals: Week 1:  PT Short Term Goal 1 (Week 1): Pt will increase bed mobility supine to edge of bed with min A, edge of bed to supine with S. PT Short Term Goal 2 (Week 1): Pt will increase transfers bed to chair, chair to bed with min guard.  PT Short Term Goal 3 (Week 1): Pt will ambulate 150 feet with LRAD and min guard.  PT Short Term Goal 4 (Week 1): Pt will ascend/descend 1 step with no rails and min A.  PT Short Term Goal 5 (Week 1): Pt will propel w/c about 150 feet with S.   Skilled Therapeutic Interventions/Progress Updates:   Pt received in bed, not fully asleep.  Pt more willing to wake up and participate today.  Pt wife reporting pt lost a pair of pajamas.  Pt willing to ambulate on unit to locate laundry room and search for pajamas.  Pt performed sit > stand supervision and sat EOB to doff grip socks and don regular socks and shoes supervision.  Pt ambulated on unit >100' with mod A and verbal guidance to use head turns and environmental cues to find laundry room and look for pajamas.  Pt able to locate room but did not find pajamas. Performed gait in controlled environment to gym with min-mod A (no AD) with continued LOB during changes in direction and LOB to R.  Performed NMR for weight shifting and balance; see below.  Discussed with pt and wife goals, D/C date, equipment needs and f/u therapy. Continued stair negotiation training with focus on one step negotiation for home entry/exit and inside house down to bathroom, without UE support x 5 reps with min-mod A for balance.  Performed alternating LE step ups to 6" step for balance during weight shifting and for full foot clearance; pt required min A for balance and intermittent cues due to perseveration with  stepping up with R.  Pt required multiple sitting and one supine rest break due to fatigue, HR: 84 bpm during rest breaks, pt denied pain or SOB.  Returned to room and to w/c with min-mod A for SLP session.    Therapy Documentation Precautions:  Precautions Precautions: Fall, Other (comment) Precaution Comments: Zoll Lifevest Restrictions Weight Bearing Restrictions: No Vital Signs: Therapy Vitals Temp: 98.4 F (36.9 C) Temp Source: Oral Pulse Rate: 86 Resp: 17 BP: (!) 91/55 mmHg Patient Position (if appropriate): Lying Oxygen Therapy SpO2: 97 % O2 Device: Not Delivered Pain: Pain Assessment Pain Assessment: No/denies pain  See FIM for current functional status  Therapy/Group: Individual Therapy  Raylene Everts [facility] 02/06/2015, 4:51 PM

## 2015-02-07 ENCOUNTER — Inpatient Hospital Stay (HOSPITAL_COMMUNITY): Payer: Medicaid Other | Admitting: Physical Therapy

## 2015-02-07 ENCOUNTER — Inpatient Hospital Stay (HOSPITAL_COMMUNITY): Payer: Medicaid Other | Admitting: Occupational Therapy

## 2015-02-07 ENCOUNTER — Ambulatory Visit (HOSPITAL_COMMUNITY): Payer: Self-pay | Admitting: Speech Pathology

## 2015-02-07 ENCOUNTER — Inpatient Hospital Stay (HOSPITAL_COMMUNITY): Payer: Self-pay | Admitting: Speech Pathology

## 2015-02-07 LAB — GLUCOSE, CAPILLARY
GLUCOSE-CAPILLARY: 173 mg/dL — AB (ref 65–99)
GLUCOSE-CAPILLARY: 258 mg/dL — AB (ref 65–99)

## 2015-02-07 NOTE — Progress Notes (Signed)
Occupational Therapy Session Note  Patient Details  Name: Timothy Lambert MRN: [id number] Date of Birth: 04-Aug-1956  Today's Date: 02/07/2015 OT Individual Time: 0930-1100 OT Individual Time Calculation (min): 90 min    Short Term Goals: Week 1:  OT Short Term Goal 1 (Week 1): Pt. will bathe self at supervision level OT Short Term Goal 2 (Week 1): Pt. will dress self at supervision level OT Short Term Goal 3 (Week 1): Pt. will show better impulse control by not getting up w/o supervision. OT Short Term Goal 4 (Week 1): pt will transfer to toilet with SBA OT Short Term Goal 5 (Week 1): Pt will transfer to shower bench with SBA  Skilled Therapeutic Interventions/Progress Updates:    Engaged in therapeutic activity with focus on attention to task, activity tolerance, functional mobility, balance reactions, and increased volume with vocalizations.  Completed horse shoe toss with focus on standing balance, progressing to standing on foam to further challenge balance with pt requiring min assist on level ground and mod assist on compliant surface.  Pt ambulated without AD with min assist, bending and retrieving items with no LOB.  Encouraged pt to speak louder as he has demonstrated increased vocal quality and strategically in louder gym environment.  Pt required multiple rest breaks.  Pt's wife present during part of session, verbalizing that she thinks pt has a harder time focusing when she is there so she will be present when needed for family education and can be around as needed but will not attend every session to allow him to focus.  Engaged in "blink" card matching game with pt requiring max faded to mod cues for rules, even when provided with visual handout.  Pt with increased initiation when told he could return to bed when activity finished.    Therapy Documentation Precautions:  Precautions Precautions: Fall, Other (comment) Precaution Comments: Zoll Lifevest Restrictions Weight  Bearing Restrictions: No General:   Vital Signs: Therapy Vitals Pulse Rate: 94 BP: 108/64 mmHg Patient Position (if appropriate): Sitting Pain:  Pt with no c/o pain  See FIM for current functional status  Therapy/Group: Individual Therapy  Simonne Come 02/07/2015, 12:35 PM

## 2015-02-07 NOTE — Telephone Encounter (Signed)
Pt noted to still be in hospital. Will leave on triage desktop for follow up on 02/08/15.

## 2015-02-07 NOTE — Progress Notes (Signed)
Physical Therapy Session Note  Patient Details  Name: Timothy Lambert MRN: [id number] Date of Birth: 12-Jul-1956  Today's Date: 02/07/2015 PT Individual Time: 1300-1400 PT Individual Time Calculation (min): 60 min   Short Term Goals: Week 1:  PT Short Term Goal 1 (Week 1): Pt will increase bed mobility supine to edge of bed with min A, edge of bed to supine with S. PT Short Term Goal 2 (Week 1): Pt will increase transfers bed to chair, chair to bed with min guard.  PT Short Term Goal 3 (Week 1): Pt will ambulate 150 feet with LRAD and min guard.  PT Short Term Goal 4 (Week 1): Pt will ascend/descend 1 step with no rails and min A.  PT Short Term Goal 5 (Week 1): Pt will propel w/c about 150 feet with S.   Skilled Therapeutic Interventions/Progress Updates:    Therapeutic Activity: Pt received napping in bed with bed alarm on and life vest in place. Pt demonstrates R side lie to sit transfer from flat bed without rail req SBA and dons socks & shoes with set up assist and increased time due to bilateral fine motor activity of untying laces & PT re-ties them them. During this activity - pt impulsively lies back down, reporting fatigue and feeling as if he was going to pass out - vitals measured, but all stable. PT redirected pt to return to this activity.  PT instructs pt in stand-step transfer bed to w/c (upright) and instructs pt in small bites with full supervision in order to ensure safe eating. Pt req repeated tactile & verbal cues to stay awake during eating task.   Gait Training: PT instructs pt in ambulation on level surface in controlled environment req up to mod A for balance x 150' - occasional scissoring of legs noted. BP noted to be low, and so pt assisted to supine on mat to rest while PT stretched pt's hamstrings and performed PROM into hip/knee flexion repeatedly x 10 reps to B legs. Then, pt assisted back to w/c.   W/C Management: PT instructs pt in w/c propulsion with B UEs x  150' req supervision for safety.   Pt's blood pressure dropped very low during PT session, and so pt assisted back to bed and family friend came in to visit. Bed alarm in place. Continue per PT POC.   Therapy Documentation Precautions:  Precautions Precautions: Fall, Other (comment) Precaution Comments: Zoll Lifevest Restrictions Weight Bearing Restrictions: No Vital Signs: Therapy Vitals Pulse Rate: 82 BP: (!) 118/59 mmHg Patient Position (if appropriate): Lying Oxygen Therapy SpO2: 99 % O2 Device: Not Delivered Pain: Pain Assessment Pain Assessment: No/denies pain Pain Score: 0-No pain  See FIM for current functional status  Therapy/Group: Individual Therapy  [name] 02/07/2015, 8:52 AM

## 2015-02-07 NOTE — Progress Notes (Addendum)
Speech Language Pathology Daily Session Note  Patient Details  Name: KYTON BIA MRN: [id number] Date of Birth: 30-Jul-1956  Today's Date: 02/07/2015 SLP Individual Time: 0800-0900; 1430-1500 SLP Individual Time Calculation (min): 60 min; 30 min   Short Term Goals: Week 1: SLP Short Term Goal 1 (Week 1): Patient will consume current diet with minimal overt s/s of aspiration with Mod A verbal cues for use of swallow strategies.  SLP Short Term Goal 2 (Week 1): Patient will consume trials of thin liquids without overt s/s of aspiration to assess readiness for repeat objective swallow study with Mod A verbal cues.  SLP Short Term Goal 3 (Week 1): Patient will utilize an increased vocal intensity at the word level with Mod A verbal cues to increase intelligibility to 75%.  SLP Short Term Goal 4 (Week 1): Patient will utilize call bell to request assistance with Max A multimodal cues in 50% of observable opportunities.  SLP Short Term Goal 5 (Week 1): Patient will identify 2 physical and 2 cognitive deficits with Mod A multimodal cues.  SLP Short Term Goal 6 (Week 1): Patient will utilize external memory aids to recall new, daily information with Mod A multimodal cues.   Skilled Therapeutic Interventions:  Session 1: Pt was seen for skilled ST targeting goals for voice and dysphagia.  Upon arrival, pt was seated upright in wheelchair, awake, lethargic, and required encouragement to participate in [place].  Pt was eventually agreeable to wearing dentures to facilitate improved mastication of solids; however, dentures did not fit and pt had no adhesive to affix them appropriately.  Pt demonstrated prolonged mastication of solid textures and required mod verbal cues for rate and portion control to facilitate timely and effective clearance of solids from the oral cavity.  Pt also required mod-max assist verbal and visual cues for use of cough and/or throat clear to correct intermittently wet vocal quality  both with and without PO intake.  Pt continues to present with dysphonia.  SLP facilitated the session with trials of diaphragmatic breathing exercises with manipulatives to improve breath support and increase vocal intensity.  Following completion of exercises, pt increased vocal intensity to achieve intelligibility in words/phrases with min-mod assist demonstration cues.  Pt was left seated upright in wheelchair, quick release belt donned, at nursing station.   Session 2:  Pt was seen for skilled ST targeting goals for dysphagia. Upon arrival, pt was reclined in bed, asleep but easily awakened to voice and light touch.  Pt required min encouragement for participation in [place] but presented with an overall brighter affect in comparison to AM therapy session.  Pt had not eaten very much lunch and reported that he did not like the food in the hospital.  SLP reviewed and reinforced rationale behind currently prescribed diet and swallowing precautions.  SLP further facilitated the session with skilled observations completed with presentations of dys 3 textures and nectar thick liquids.  Pt consumed thickened liquids with mod assist verbal and tactile cues for use of rate and portion control.  Mild delayed, soft cough was noted throughout therapy session both with and without PO intake.  Pt also required mod encouragement to consume ~25% of his meal. When pt indicated that he was finished with his meal, pt was transferred back to bed with bed alarm activated, all needs left within reach.  Continue per current plan of care.    FIM:  Comprehension Comprehension Mode: Auditory Comprehension: 4-Understands basic 75 - 89% of the time/requires cueing 10 -  24% of the time Expression Expression Mode: Verbal Expression: 3-Expresses basic 50 - 74% of the time/requires cueing 25 - 50% of the time. Needs to repeat parts of sentences. Social Interaction Social Interaction: 4-Interacts appropriately 75 - 89% of the time -  Needs redirection for appropriate language or to initiate interaction. Problem Solving Problem Solving: 3-Solves basic 50 - 74% of the time/requires cueing 25 - 49% of the time Memory Memory: 2-Recognizes or recalls 25 - 49% of the time/requires cueing 51 - 75% of the time FIM - Eating Eating Activity: 5: Needs verbal cues/supervision;4: Helper checks for pocketed food  Pain Pain Assessment (Session 1) Pain Assessment: No/denies pain Pain Assessment (Session 2) Pain Assessment: No/denies pain  Therapy/Group: Individual Therapy  Shadrack Brummitt, Selinda Orion 02/07/2015, 3:55 PM

## 2015-02-07 NOTE — Progress Notes (Signed)
[facility] PHYSICAL [facility]ICINE & REHABILITATION     PROGRESS NOTE 59 y.o. right handed male with history of hypertension, alcohol abuse, diabetes mellitus and coronary artery disease maintained on aspirin. Patient lives with his wife and independent up until a few weeks ago. Presented to [facility] 01/23/2015 with seizures. Extremely agitated requiring intubation for airway protection. Alcohol level and drug screen negative. CT of the head negative for acute changes. EKG with precordial ST changes concerning for ischemia/infarct. Transferred to [facility] for ongoing care. After arriving to [facility] repeat EKG with worsened ST elevations and T-wave inversions troponin greater than 65. Also noted blood pressure in the high 200s over 100s and glucose in the 400s. Underwent emergent cardiac catheterization findings of 99% LAD stenosis with stenting. He was also fitted with a life vest. Echocardiogram with ejection fraction [id number] grade 1 diastolic dysfunction. MRI 01/24/2015 showing multiple small foci of acute ischemia spanning multiple vascular territories consistent with embolic event.   Subjective/Complaints: Eating breakfast with SLP.  D/W SLP, pt has had low volume breathy voice during her session Slow to respond to questions but following simple commands Reluctant to wear dentures and questions why?  ROS:   No SOB, No CP, no abd pain  Objective: Vital Signs: Blood pressure 150/80, pulse 86, temperature 98.8 F (37.1 C), temperature source Oral, resp. rate 18, weight 64.4 kg (141 lb 15.6 oz), SpO2 100 %. No results found. No results for input(s): WBC, HGB, HCT, PLT in the last 72 hours. No results for input(s): NA, K, CL, GLUCOSE, BUN, CREATININE, CALCIUM in the last 72 hours.  Invalid input(s): CO CBG (last 3)   Recent Labs  02/06/15 1639 02/06/15 2101 02/07/15 0730  GLUCAP 269* 170* 173*    Wt Readings from Last 3 Encounters:  02/02/15 64.4 kg (141 lb 15.6 oz)  02/01/15  64.2 kg (141 lb 8.6 oz)    Physical Exam:  HENT:  Head: Normocephalic.  Eyes:  Pupil reactive to light   Neck: Normal range of motion. Neck supple. No thyromegaly present.  Dysphonic voice persistent Cardiovascular: Normal rate and regular rhythm.  Respiratory: Effort normal and breath sounds normal. No respiratory distress.  GI: Soft. Bowel sounds are normal. He exhibits no distension.  Neurological:  Patient is alert, less restless and but still impulsive.  Limited awareness of his deficits. Followed simple commands. Moves all 4's with at least 4/5 strength proximal to distal in upper/lower limbs.  Difficult to assess visual fields looks like bitemporal field cut but cooperation with testing is questionable Skin: Skin is warm and dry.  Psychiatric:  Pleasant and cooperative in general   Assessment/Plan: 1. 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 Physiatrist is providing close team supervision and 24 hour management of active medical problems listed below. Physiatrist and rehab team continue to assess barriers to discharge/monitor patient progress toward functional and medical goals.  FIM: FIM - Bathing Bathing Steps Patient Completed: Chest, Right Arm, Left Arm, Abdomen, Buttocks, Right upper leg, Left upper leg, Front perineal area, Right lower leg (including foot), Left lower leg (including foot) Bathing: 4: Steadying assist  FIM - Upper Body Dressing/Undressing Upper body dressing/undressing steps patient completed: Thread/unthread right sleeve of front closure shirt/dress, Thread/unthread left sleeve of front closure shirt/dress, Button/unbutton shirt Upper body dressing/undressing: 4: Min-Patient completed 75 plus % of tasks FIM - Lower Body Dressing/Undressing Lower body dressing/undressing steps patient  completed: Thread/unthread right pants leg,  Thread/unthread left pants leg, Pull pants up/down, Don/Doff right sock, Don/Doff left sock, Don/Doff right shoe, Don/Doff left shoe, Fasten/unfasten right shoe, Fasten/unfasten left shoe Lower body dressing/undressing: 5: Set-up assist to: Obtain clothing  FIM - Toileting Toileting steps completed by patient: Performs perineal hygiene Toileting Assistive Devices: Grab bar or rail for support Toileting: 2: Max-Patient completed 1 of 3 steps  FIM - Radio producer Devices: Grab bars, Insurance account manager Transfers: 3-To toilet/BSC: Mod A (lift or lower assist), 3-From toilet/BSC: Mod A (lift or lower assist)  FIM - Control and instrumentation engineer Devices: Arm rests Bed/Chair Transfer: 5: Supine > Sit: Supervision (verbal cues/safety issues), 5: Sit > Supine: Supervision (verbal cues/safety issues), 4: Bed > Chair or W/C: Min A (steadying Pt. > 75%), 4: Chair or W/C > Bed: Min A (steadying Pt. > 75%)  FIM - Locomotion: Wheelchair Distance: 100 Locomotion: Wheelchair: 1: Total Assistance/staff pushes wheelchair (Pt<25%) FIM - Locomotion: Ambulation Locomotion: Ambulation Assistive Devices: Administrator Ambulation/Gait Assistance: 3: Mod assist Locomotion: Ambulation: 3: Travels 150 ft or more with moderate assistance (Pt: 50 - 74%)  Comprehension Comprehension Mode: Auditory Comprehension: 4-Understands basic 75 - 89% of the time/requires cueing 10 - 24% of the time  Expression Expression Mode: Verbal Expression: 3-Expresses basic 50 - 74% of the time/requires cueing 25 - 50% of the time. Needs to repeat parts of sentences.  Social Interaction Social Interaction: 4-Interacts appropriately 75 - 89% of the time - Needs redirection for appropriate language or to initiate interaction.  Problem Solving Problem Solving: 3-Solves basic 50 - 74% of the time/requires cueing 25 - 49% of the time  Memory Memory: 3-Recognizes or recalls 50 - 74% of the  time/requires cueing 25 - 49% of the time Medical Problem List and Plan: 1. Functional deficits secondary to embolic CVA/ s/pSTEMI status post stenting. Fitted with LifeVest  -hold on showering , vest can come off for 11min, per OT bathing time is ~63min, pt is slow 2. DVT Prophylaxis/Anticoagulation: Aspirin/Brilinta as well as SCDs. Monitor for any signs of DVT 3. Pain Management: Tylenol as needed 4. Seizure disorder. Keppra 500 mg twice a day. EEG negative. 5. Neuropsych: This patient is capable of making decisions on his own behalf.  -bed alarm, ?mittens for safety---close observation/safety plan 6. Skin/Wound Care: Routine skin checks 7. Fluids/Electrolytes/Nutrition: Routine I&O's with follow-up chemistries  -inadequate intake presently 8. Dysphagia. Dysphagia 3 nectar liquids. Follow-up speech therapy, cough is dysphagia (trouble handling saliva) related do not suspect PNA at this point, vocal hoarseness, ? Vocal cord edema or paralysis, will need ENT f/u 9. Hypertension. Coreg 25 mg twice a day, lisinopril 10 mg daily. Monitor with increased mobility 10. Diabetes mellitus and peripheral neuropathy. Hemoglobin A1c 9.3. Lantus insulin 14 units daily. Check blood sugars before meals and at bedtime.  -sugars fairly controlled at present.   -increase lantus to 26u qam  (on 14u at home) 11. Alcohol abuse. Monitor for any signs of withdrawal provide counseling 12. Hyperlipidemia. Lipitor   LOS (Days) 6 A FACE TO FACE EVALUATION WAS PERFORMED  Jassmine Vandruff E 02/07/2015 8:03 AM

## 2015-02-08 ENCOUNTER — Inpatient Hospital Stay (HOSPITAL_COMMUNITY): Payer: Self-pay | Admitting: Speech Pathology

## 2015-02-08 ENCOUNTER — Inpatient Hospital Stay (HOSPITAL_COMMUNITY): Payer: Medicaid Other | Admitting: Physical Therapy

## 2015-02-08 ENCOUNTER — Inpatient Hospital Stay (HOSPITAL_COMMUNITY): Payer: Self-pay | Admitting: Occupational Therapy

## 2015-02-08 ENCOUNTER — Encounter: Payer: Self-pay | Admitting: Nurse Practitioner

## 2015-02-08 DIAGNOSIS — E875 Hyperkalemia: Secondary | ICD-10-CM | POA: Insufficient documentation

## 2015-02-08 LAB — CBC
HCT: 32.4 % — ABNORMAL LOW (ref 39.0–52.0)
Hemoglobin: 11.3 g/dL — ABNORMAL LOW (ref 13.0–17.0)
MCH: 30.3 pg (ref 26.0–34.0)
MCHC: 34.9 g/dL (ref 30.0–36.0)
MCV: 86.9 fL (ref 78.0–100.0)
PLATELETS: 384 10*3/uL (ref 150–400)
RBC: 3.73 MIL/uL — AB (ref 4.22–5.81)
RDW: 12.5 % (ref 11.5–15.5)
WBC: 10.1 10*3/uL (ref 4.0–10.5)

## 2015-02-08 LAB — BASIC METABOLIC PANEL
Anion gap: 11 (ref 5–15)
BUN: 54 mg/dL — ABNORMAL HIGH (ref 6–20)
CALCIUM: 9.3 mg/dL (ref 8.9–10.3)
CO2: 24 mmol/L (ref 22–32)
CREATININE: 2.56 mg/dL — AB (ref 0.61–1.24)
Chloride: 100 mmol/L — ABNORMAL LOW (ref 101–111)
GFR calc Af Amer: 30 mL/min — ABNORMAL LOW (ref >60)
GFR, EST NON AFRICAN AMERICAN: 26 mL/min — AB (ref >60)
GLUCOSE: 171 mg/dL — AB (ref 65–99)
Potassium: 6.1 mmol/L (ref 3.5–5.1)
Sodium: 135 mmol/L (ref 135–145)

## 2015-02-08 LAB — GLUCOSE, CAPILLARY
GLUCOSE-CAPILLARY: 116 mg/dL — AB (ref 65–99)
GLUCOSE-CAPILLARY: 207 mg/dL — AB (ref 65–99)
Glucose-Capillary: 135 mg/dL — ABNORMAL HIGH (ref 65–99)
Glucose-Capillary: 147 mg/dL — ABNORMAL HIGH (ref 65–99)
Glucose-Capillary: 296 mg/dL — ABNORMAL HIGH (ref 65–99)
Glucose-Capillary: 156 mg/dL — ABNORMAL HIGH (ref 65–99)
Glucose-Capillary: 140 mg/dL — ABNORMAL HIGH (ref 65–99)

## 2015-02-08 MED ORDER — LISINOPRIL 10 MG PO TABS
10.0000 mg | ORAL_TABLET | Freq: Every day | ORAL | Status: DC
  Administered 2015-02-10 – 2015-02-14 (×5): 10 mg via ORAL
  Filled 2015-02-08 (×6): qty 1

## 2015-02-08 MED ORDER — SODIUM CHLORIDE 0.9 % IV SOLN
INTRAVENOUS | Status: AC
  Administered 2015-02-09 (×2): via INTRAVENOUS

## 2015-02-08 MED ORDER — SODIUM CHLORIDE 0.9 % IV BOLUS (SEPSIS): 200.0000 mL | Freq: Once | INTRAVENOUS | Status: DC

## 2015-02-08 NOTE — Telephone Encounter (Signed)
Patient still admitted at the present time.

## 2015-02-08 NOTE — Progress Notes (Signed)
Speech Language Pathology Weekly Progress and Session Note  Patient Details  Name: Timothy Lambert MRN: [id number] Date of Birth: [birth year]  Beginning of progress report period: Feb 01, 2015 End of progress report period: Feb 02, 2015  Today's Date: 02/08/2015 SLP Individual Time: 1000-1100 SLP Individual Time Calculation (min): 60 min SLP Co-treatment Time:  1400-1430 SLP Co-treatment Time Calculation (min): 30 min  Short Term Goals: Week 1: SLP Short Term Goal 1 (Week 1): Patient will consume current diet with minimal overt s/s of aspiration with Mod A verbal cues for use of swallow strategies.  SLP Short Term Goal 1 - Progress (Week 1): Met SLP Short Term Goal 2 (Week 1): Patient will consume trials of thin liquids without overt s/s of aspiration to assess readiness for repeat objective swallow study with Mod A verbal cues.  SLP Short Term Goal 2 - Progress (Week 1): Progressing toward goal SLP Short Term Goal 3 (Week 1): Patient will utilize an increased vocal intensity at the word level with Mod A verbal cues to increase intelligibility to 75%.  SLP Short Term Goal 3 - Progress (Week 1): Met SLP Short Term Goal 4 (Week 1): Patient will utilize call bell to request assistance with Max A multimodal cues in 50% of observable opportunities.  SLP Short Term Goal 4 - Progress (Week 1): Met SLP Short Term Goal 5 (Week 1): Patient will identify 2 physical and 2 cognitive deficits with Mod A multimodal cues.  SLP Short Term Goal 5 - Progress (Week 1): Progressing toward goal SLP Short Term Goal 6 (Week 1): Patient will utilize external memory aids to recall new, daily information with Mod A multimodal cues.  SLP Short Term Goal 6 - Progress (Week 1): Progressing toward goal    New Short Term Goals: Week 2: SLP Short Term Goal 1 (Week 2): Patient will consume current diet with minimal overt s/s of aspiration with Min A verbal cues for use of swallow strategies.  SLP Short Term Goal 2 (Week  2): Patient will consume trials of thin liquids without overt s/s of aspiration to assess readiness for repeat objective swallow study with Mod A verbal cues.  SLP Short Term Goal 3 (Week 2): Patient will utilize an increased vocal intensity at the word level with Min A verbal cues to increase intelligibility to 75%.  SLP Short Term Goal 4 (Week 2): Patient will utilize call bell to request assistance with Mod A multimodal cues in 50% of observable opportunities.  SLP Short Term Goal 5 (Week 2): Patient will identify 2 physical and 2 cognitive deficits with Mod A multimodal cues.  SLP Short Term Goal 6 (Week 2): Patient will utilize external memory aids to recall new, daily information with Mod A multimodal cues.   Weekly Progress Updates:  Pt made slow functional gains this reporting period and has met 3 out of 6 short term goals.  Pt currently requires overall mod assist during basic functional tasks due to decreased awareness of deficits, fluctuating orientation and recall of daily events/information, decreased sustained attention to tasks, and decreased functional problem solving.  Pt is consuming dys 3 textures and nectar thick liquids with mod assist cues for use of swallowing precautions.  Trials of thin liquids have been limited this reporting period due to audible, congested sounding respirations and inconsistent coughing/throat clearing following all consistencies.  Pt will need a repeat objective swallow study prior to discharge.  Pt would continue to benefit from skilled ST while inpatient in order  to maximize functional independence and reduce burden of care prior to discharge.  Pt and family education is ongoing.  Continue to recommend that pt have 24/7 supervision and assistance for medication and financial management upon discharge as well as ST follow up at next level of care.     Intensity: Minumum of 1-2 x/day, 30 to 90 minutes Frequency: 3 to 5 out of 7 days Duration/Length of Stay:  12-14 days  Treatment/Interventions: Cognitive remediation/compensation;Cueing hierarchy;Dysphagia/aspiration precaution training;Environmental controls;Internal/external aids;Speech/Language facilitation;Patient/family education;Therapeutic Activities;Functional tasks   Daily Session  Skilled Therapeutic Interventions:  Session 1:  Pt was seen for skilled ST targeting cognitive goal.  Upon arrival, pt was asleep in bed but easily awakened to voice and light touch.  Pt required min encouragement to participate in therapy.  SLP facilitated the session with a previously taught card game from OT session yesterday to target recall of new information and functional problem solving.  Pt recalled rules of game with max assist verbal and visual cues.  Pt planned and executed a problem solving strategy during the abovementioned activity with overall max assist multimodal cues due to poor working memory, decreased sustained attention to task, and decreased awareness of errors for functional problem solving.  Pt's wife was present for the duration of today's therapy session; therefore, SLP continued ongoing education regarding pt's need for cuing and encouragement to participate in therapies.  Pt's wife returned demonstration of targeted strategies appropriately during structured therapeutic tasks.  SLP also asked if pt's wife could bring in a denture adhesive to more securely affix pt's dentures when eating meals.  Pt left upright in wheelchair and encouraged to sit up until at least lunch time.  Pt left with quick release belt donned, wife present, and all needs left within reach.    Session 2: Pt was seen for skilled PT/ST co-treatment targeting cognitive goals while engaged in functional mobility.  Upon arrival, pt was asleep in bed but was easily awakened to voice and light touch.  Pt required mod-max encouragement to don his shoes to participate in therapy.  Goal of treatment session was initially to have pt  complete a scavenger hunt while ambulating around the unit; however, upon standing up pt became unsteady on his feet, complained of lightheadedness and was therefore lowered back to straight back chair.  Vital signs were taken and pt was found to be hypotensive (81/54).   RN made aware, PT donned TED hose.  SLP and PT encouraged pt to sit up more during the day and to increase PO intake as he has not been eating or drinking well over the last 2 days and these could potentially be contributing to hypotensive events.  Pt verbalized understanding immediately following education; however, at the end of the session, he required max assist verbal cues to recall recommendations.  ST will continue to reinforce recommendations with pt and family.  Scavenger hunt was completed at wheelchair level for pt safety.  Pt located 5 similar items in the hallway with overall max assist multimodal cues for selective attention to task, recall of task rules, and functional problem solving of wheelchair propulsion.  Pt also required max assist verbal cues for redirection due to perseveration and max assist multimodal cues to recall route back to room after scavenger hunt activityPt left up in wheelchair, quick release belt donned, at nursing station.       FIM:  Comprehension Comprehension Mode: Auditory Comprehension: 4-Understands basic 75 - 89% of the time/requires cueing 10 - 24%  of the time Expression Expression Mode: Verbal Expression: 3-Expresses basic 50 - 74% of the time/requires cueing 25 - 50% of the time. Needs to repeat parts of sentences. Social Interaction Social Interaction: 4-Interacts appropriately 75 - 89% of the time - Needs redirection for appropriate language or to initiate interaction. Problem Solving Problem Solving: 3-Solves basic 50 - 74% of the time/requires cueing 25 - 49% of the time Memory Memory: 2-Recognizes or recalls 25 - 49% of the time/requires cueing 51 - 75% of the time General     Pain Pain Assessment (session 1) Pain Assessment: No/denies pain Pain Assessment (seession 2) Pain Assessment: No/denies pain  Therapy/Group: Individual Therapy  Tanyon Alipio, Selinda Orion 02/08/2015, 4:06 PM

## 2015-02-08 NOTE — Progress Notes (Addendum)
Called by internal medicine coverage tonight to check patient.  Patient was found to have lab evidence of dehydration and hyperkalemia (K6.1). EKG shows marked persistent changes of recent anterior MI secondary to 99% stenosis of LAD. No changes of hyperkalemia. Patient is lying flat, not dyspneic. Denies chest pain. Creatinine has risen from 1.55 to 2.56 since 02/04/15. Lungs are clear. Life Vest in place. Heart no gallop or rub. Recommend:  Agree with IV fluids overnight as ordered by IM coverage. Will stop lasix.  Consider restart later at lower dose. Follow BMET closely. Hyperkalemia should improve with hydration. Patient also on ACEi.

## 2015-02-08 NOTE — Significant Event (Signed)
Nurse called to pt's room due to hypotensive BP reading of 81/54. Pt reported symptoms of feeling dizzy, and appeared to be unsteady while ambulating. Placed in chair by therapist. Manual BP reading of 92/50,  BG 207.  Silvestre Mesi, PA advised. New order written. Staff to continue to monitor  Addendum: At 1753 received call from Lab regarding critical K+ value of 6.1. On call physician, Dr. Asa Lente notified. Order received for EKG 12 lead, along with NS bolus, and repeat of BMP in the a.m. Also advised to notify Dr. Asa Lente when EKG was complete so findings could be reviewed. Dr. Asa Lente made aware of EKG upon completion. She reviewed and advised that she had contacted Dr. Mare Ferrari, cardiologist on call who would come by to assess pt.

## 2015-02-08 NOTE — Progress Notes (Signed)
Occupational Therapy Weekly Progress Note  Patient Details  Name: Timothy Lambert MRN: [id number] Date of Birth: Jan 10, 1956  Beginning of progress report period: Feb 02, 2015 End of progress report period: Feb 08, 2015  Today's Date: 02/08/2015 OT Individual Time: 0735-0900 OT Individual Time Calculation (min): 85 min    Patient has met 5 of 5 short term goals.  Pt is making steady progress towards goals.  He is currently at a close supervision with sit <> stand and stand pivot transfers from w/c level and a min assist with short distance ambulation without AD.  Pt continues to require verbal cues for initiation, attention to task, and sequencing, however does benefit from familiar tasks and repetition.  Pt requires frequent rest breaks due to decreased activity tolerance.  Patient continues to demonstrate the following deficits: cognitive deficits impacting his attention, initiation, and sequencing, as well as safety awareness, decreased activity tolerance and endurance and therefore will continue to benefit from skilled OT intervention to enhance overall performance with BADL, iADL and Reduce care partner burden.  Patient progressing toward long term goals..  Continue plan of care.  OT Short Term Goals Week 1:  OT Short Term Goal 1 (Week 1): Pt. will bathe self at supervision level OT Short Term Goal 1 - Progress (Week 1): Met OT Short Term Goal 2 (Week 1): Pt. will dress self at supervision level OT Short Term Goal 2 - Progress (Week 1): Met OT Short Term Goal 3 (Week 1): Pt. will show better impulse control by not getting up w/o supervision. OT Short Term Goal 3 - Progress (Week 1): Met OT Short Term Goal 4 (Week 1): pt will transfer to toilet with SBA OT Short Term Goal 4 - Progress (Week 1): Met OT Short Term Goal 5 (Week 1): Pt will transfer to shower bench with SBA OT Short Term Goal 5 - Progress (Week 1): Met Week 2:  OT Short Term Goal 1 (Week 2): STG = LTGs due to remaining  LOS  Skilled Therapeutic Interventions/Progress Updates:    Engaged in ADL retraining with focus on sit <> stand, standing tolerance, and increased independence with self-care tasks.  Pt close supervision with stand pivot bed > w/c and throughout bathing tasks at sink.  Discussed rationale for bathing at sink this session, as pt requires >10 mins with bathing and recommendation is only to have Life Vest removed for 10 mins.  Min cues provided throughout bathing and dressing for sequencing and initiation and pt taking multiple rest breaks throughout.  Engaged in self-feeding with pt requiring assist to open container and cues for intermittent throat clear.  Pt with intermittent coughing during session both with and without food/thickened liquids.    Therapy Documentation Precautions:  Precautions Precautions: Fall, Other (comment) Precaution Comments: [place] Lifevest Restrictions Weight Bearing Restrictions: No General:   Vital Signs: Therapy Vitals Temp: 98.5 F (36.9 C) Temp Source: Oral Pulse Rate: 83 Resp: 18 BP: 111/74 mmHg Patient Position (if appropriate): Lying Oxygen Therapy SpO2: 100 % O2 Device: Not Delivered Pain:  Pt with no c/o pain  See FIM for current functional status  Therapy/Group: Individual Therapy  Simonne Come 02/08/2015, 8:13 AM

## 2015-02-08 NOTE — Progress Notes (Signed)
Physical Therapy Weekly Progress Note and Session Note  Patient Details  Name: Timothy Lambert MRN: [id number] Date of Birth: 1956/03/17  Beginning of progress report period: Feb 02, 2015 End of progress report period: Feb 08, 2015  Today's Date: 02/08/2015 PT Co-Treatment Time: 1430 (60 min co-treat with SLP)-1500 PT Co-Treatment Time Calculation (min): 30 min  Patient has met 4 of 5 short term goals with w/c mobility goal discontinued due to pt ambulatory on unit >150'.  Pt has made good progress and is currently supervision for bed mobility, min A overall for basic transfers, min-mod A for higher level gait and stair negotiation.    Patient continues to demonstrate the following deficits: impaired activity tolerance/endurance, impaired cognition (including attention, memory, awareness and problem solving), impaired strength, motor control and coordination, impaired postural control, balance, gait and therefore will continue to benefit from skilled PT intervention to enhance overall performance with activity tolerance, balance, postural control, ability to compensate for deficits, attention, awareness and coordination.  Patient progressing toward long term goals..  Plan of care revisions: Balance, car and floor transfer goals added.  W/c goals D/C due to pt ambulatory >150', bed mobility goal upgraded due to progress.  PT Short Term Goals Week 1:  PT Short Term Goal 1 (Week 1): Pt will increase bed mobility supine to edge of bed with min A, edge of bed to supine with S. PT Short Term Goal 1 - Progress (Week 1): Met PT Short Term Goal 2 (Week 1): Pt will increase transfers bed to chair, chair to bed with min guard.  PT Short Term Goal 2 - Progress (Week 1): Met PT Short Term Goal 3 (Week 1): Pt will ambulate 150 feet with LRAD and min guard.  PT Short Term Goal 3 - Progress (Week 1): Met PT Short Term Goal 4 (Week 1): Pt will ascend/descend 1 step with no rails and min A.  PT Short Term  Goal 4 - Progress (Week 1): Met PT Short Term Goal 5 (Week 1): Pt will propel w/c about 150 feet with S.  PT Short Term Goal 5 - Progress (Week 1): Discontinued (comment) Week 2:  PT Short Term Goal 1 (Week 2): = LTG for D/C 02/14/15  Skilled Therapeutic Interventions/Progress Updates:   Skilled PT and SLP co-treat with use of scavenger hunt (list of 6 items) on unit to focus on memory and attention to task and gait in controlled environment.  Pt lethargic but able to don shoes EOB with max encouragement.  Pt given list of items and when standing to ambulate out of the room pt began to stagger L and R with LOB and total A to prevent fall.  Pt reporting feeling dizzy.  Pt returned to sitting and vitals assessed: see below.  RN alerted of low BP and manual BP taken. PA also notified of low BP.  PA cleared pt to continue with therapy to tolerance and ordered KHT.  Also discussed with pt importance of maintaining nutrition and hydration and sitting up out of the bed during the day to allow BP to adjust to upright.  KHT donned and pt transferred to w/c stand pivot with supervision-min A to continue scavenger hunt at w/c level.  Performed 1st task on scavenger hunt (finding 5 fire extinguishers) while performing w/c mobility with bilat UE and LE >150' in controlled environment with pt performing alternating attention to negotiate around obstacles and scan environment to find fire extinguishers.  Pt required mod-max cues to correctly  identify fire extinguishers due to pt perseveration on the color red.  Pt returned to room to tall back arm chair but RN concerned about pt safety even under supervision of brother.  Pt transferred back to w/c to RN station for supervision.  No other episodes of orthostasis during session but pt remained lethargic during remainder of session.    Therapy Documentation Precautions:  Precautions Precautions: Fall, Other (comment) Precaution Comments: Zoll Lifevest Restrictions Weight  Bearing Restrictions: No Vital Signs: Therapy Vitals Pulse Rate: 86 BP: (!) 81/54 mmHg Patient Position (if appropriate): Sitting Oxygen Therapy SpO2: 100 % Pain:  No c/o pain Locomotion : Ambulation Ambulation/Gait Assistance: 3: Mod assist;2: Max Financial controller Distance: 150   See FIM for current functional status  Therapy/Group: Co-Treatment  Raylene Everts Faucette 02/08/2015, 3:35 PM

## 2015-02-08 NOTE — Progress Notes (Signed)
[place] PHYSICAL MEDICINE & REHABILITATION     PROGRESS NOTE 59 y.o. right handed male with history of hypertension, alcohol abuse, diabetes mellitus and coronary artery disease maintained on aspirin. Patient lives with his wife and independent up until a few weeks ago. Presented to [facility]/23/2015 with seizures. Extremely agitated requiring intubation for airway protection. Alcohol level and drug screen negative. CT of the head negative for acute changes. EKG with precordial ST changes concerning for ischemia/infarct. Transferred to [facility] for ongoing care. After arriving to [facility] repeat EKG with worsened ST elevations and T-wave inversions troponin greater than 65. Also noted blood pressure in the high 200s over 100s and glucose in the 400s. Underwent emergent cardiac catheterization findings of 99% LAD stenosis with stenting. He was also fitted with a life vest. Echocardiogram with ejection fraction [id number] grade 1 diastolic dysfunction. MRI 01/24/2015 showing multiple small foci of acute ischemia spanning multiple vascular territories consistent with embolic event.   Subjective/Complaints: Doing ADLs at sink with OT No c/os "should I be hurting" Vocal volume low, hoarse  ROS:   No SOB, No CP, no abd pain  Objective: Vital Signs: Blood pressure 111/74, pulse 83, temperature 98.5 F (36.9 C), temperature source Oral, resp. rate 18, weight 64.4 kg (141 lb 15.6 oz), SpO2 100 %. No results found. No results for input(s): WBC, HGB, HCT, PLT in the last 72 hours. No results for input(s): NA, K, CL, GLUCOSE, BUN, CREATININE, CALCIUM in the last 72 hours.  Invalid input(s): CO CBG (last 3)   Recent Labs  02/06/15 2101 02/07/15 0730 02/07/15 1147  GLUCAP 170* 173* 258*    Wt Readings from Last 3 Encounters:  02/02/15 64.4 kg (141 lb 15.6 oz)  02/01/15 64.2 kg (141 lb 8.6 oz)    Physical Exam:  HENT:  Head: Normocephalic.  Eyes:  Pupil reactive to light    Neck: Normal range of motion. Neck supple. No thyromegaly present.  Dysphonic voice persistent Cardiovascular: Normal rate and regular rhythm.  Respiratory: Effort normal and breath sounds normal. No respiratory distress.  GI: Soft. Bowel sounds are normal. He exhibits no distension.  Neurological:  Patient is alert, less restless and but still impulsive.  Limited awareness of his deficits. Followed simple commands. Moves all 4's with at least 4/5 strength proximal to distal in upper/lower limbs.  Difficult to assess visual fields looks like bitemporal field cut but cooperation with testing is questionable Skin: Skin is warm and dry.  Psychiatric:  Pleasant and cooperative in general   Assessment/Plan: 1. 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 Physiatrist is providing close team supervision and 24 hour management of active medical problems listed below. Physiatrist and rehab team continue to assess barriers to discharge/monitor patient progress toward functional and medical goals.  FIM: FIM - Bathing Bathing Steps Patient Completed: Chest, Right Arm, Left Arm, Abdomen, Buttocks, Right upper leg, Left upper leg, Front perineal area, Right lower leg (including foot), Left lower leg (including foot) Bathing: 4: Steadying assist  FIM - Upper Body Dressing/Undressing Upper body dressing/undressing steps patient completed: Thread/unthread right sleeve of front closure shirt/dress, Thread/unthread left sleeve of front closure shirt/dress, Button/unbutton shirt Upper body dressing/undressing: 4: Min-Patient completed 75 plus % of tasks FIM - Lower Body Dressing/Undressing Lower body dressing/undressing steps patient completed: Thread/unthread right pants leg, Thread/unthread left pants leg, Pull pants up/down, Don/Doff right sock, Don/Doff left  sock, Don/Doff right shoe,  Don/Doff left shoe, Fasten/unfasten right shoe, Fasten/unfasten left shoe Lower body dressing/undressing: 5: Set-up assist to: Obtain clothing  FIM - Toileting Toileting steps completed by patient: Performs perineal hygiene Toileting Assistive Devices: Grab bar or rail for support Toileting: 2: Max-Patient completed 1 of 3 steps  FIM - Radio producer Devices: Grab bars, Insurance account manager Transfers: 3-To toilet/BSC: Mod A (lift or lower assist), 3-From toilet/BSC: Mod A (lift or lower assist)  FIM - Control and instrumentation engineer Devices: Arm rests Bed/Chair Transfer: 5: Supine > Sit: Supervision (verbal cues/safety issues), 5: Sit > Supine: Supervision (verbal cues/safety issues), 5: Bed > Chair or W/C: Supervision (verbal cues/safety issues), 4: Chair or W/C > Bed: Min A (steadying Pt. > 75%)  FIM - Locomotion: Wheelchair Distance: 150 Locomotion: Wheelchair: 5: Travels 150 ft or more: maneuvers on rugs and over door sills with supervision, cueing or coaxing FIM - Locomotion: Ambulation Locomotion: Ambulation Assistive Devices:  (no AD) Ambulation/Gait Assistance: 3: Mod assist Locomotion: Ambulation: 3: Travels 150 ft or more with moderate assistance (Pt: 50 - 74%)  Comprehension Comprehension Mode: Auditory Comprehension: 4-Understands basic 75 - 89% of the time/requires cueing 10 - 24% of the time  Expression Expression Mode: Verbal Expression: 3-Expresses basic 50 - 74% of the time/requires cueing 25 - 50% of the time. Needs to repeat parts of sentences.  Social Interaction Social Interaction: 4-Interacts appropriately 75 - 89% of the time - Needs redirection for appropriate language or to initiate interaction.  Problem Solving Problem Solving: 3-Solves basic 50 - 74% of the time/requires cueing 25 - 49% of the time  Memory Memory: 2-Recognizes or recalls 25 - 49% of the time/requires cueing 51 - 75% of the time Medical Problem  List and Plan: 1. Functional deficits secondary to embolic CVA/ s/pSTEMI status post stenting. Fitted with LifeVest  -hold on showering , vest can come off for 71min, per OT bathing time is ~19min, pt is slow 2. DVT Prophylaxis/Anticoagulation: Aspirin/Brilinta as well as SCDs. Monitor for any signs of DVT 3. Pain Management: Tylenol as needed 4. Seizure disorder. Keppra 500 mg twice a day. EEG negative. 5. Neuropsych: This patient is capable of making decisions on his own behalf.  -bed alarm, ?mittens for safety---close observation/safety plan 6. Skin/Wound Care: Routine skin checks 7. Fluids/Electrolytes/Nutrition: Routine I&O's with follow-up chemistries  -inadequate intake presently 8. Dysphagia. Dysphagia 3 nectar liquids. Follow-up speech therapy, cough is dysphagia (trouble handling saliva) related do not suspect PNA at this point, vocal hoarseness, ? Vocal cord edema or paralysis,? Emergent intubation will need ENT f/u 9. Hypertension. Coreg 25 mg twice a day, lisinopril 10 mg daily. Monitor with increased mobility 10. Diabetes mellitus and peripheral neuropathy. Hemoglobin A1c 9.3. Lantus insulin 14 units daily. Check blood sugars before meals and at bedtime.  -sugars fairly controlled at present.   -increase lantus to 26u qam  (on 14u at home) 11. Alcohol abuse. Monitor for any signs of withdrawal provide counseling 12. Hyperlipidemia. Lipitor   LOS (Days) 7 A FACE TO FACE EVALUATION WAS PERFORMED  Charlett Blake 02/08/2015 7:53 AM

## 2015-02-09 ENCOUNTER — Encounter (HOSPITAL_COMMUNITY): Payer: Self-pay | Admitting: Occupational Therapy

## 2015-02-09 ENCOUNTER — Inpatient Hospital Stay (HOSPITAL_COMMUNITY): Payer: Self-pay | Admitting: *Deleted

## 2015-02-09 DIAGNOSIS — I6340 Cerebral infarction due to embolism of unspecified cerebral artery: Secondary | ICD-10-CM

## 2015-02-09 DIAGNOSIS — E1165 Type 2 diabetes mellitus with hyperglycemia: Secondary | ICD-10-CM

## 2015-02-09 DIAGNOSIS — E875 Hyperkalemia: Secondary | ICD-10-CM

## 2015-02-09 LAB — URINALYSIS, ROUTINE W REFLEX MICROSCOPIC
BILIRUBIN URINE: NEGATIVE
Glucose, UA: NEGATIVE mg/dL
Hgb urine dipstick: NEGATIVE
KETONES UR: NEGATIVE mg/dL
NITRITE: NEGATIVE
PROTEIN: NEGATIVE mg/dL
Specific Gravity, Urine: 1.012 (ref 1.005–1.030)
UROBILINOGEN UA: 0.2 mg/dL (ref 0.0–1.0)
pH: 5.5 (ref 5.0–8.0)

## 2015-02-09 LAB — BASIC METABOLIC PANEL
Anion gap: 9 (ref 5–15)
BUN: 47 mg/dL — AB (ref 6–20)
CO2: 24 mmol/L (ref 22–32)
Calcium: 8.2 mg/dL — ABNORMAL LOW (ref 8.9–10.3)
Chloride: 104 mmol/L (ref 101–111)
Creatinine, Ser: 1.96 mg/dL — ABNORMAL HIGH (ref 0.61–1.24)
GFR calc Af Amer: 42 mL/min — ABNORMAL LOW (ref >60)
GFR, EST NON AFRICAN AMERICAN: 36 mL/min — AB (ref >60)
GLUCOSE: 160 mg/dL — AB (ref 65–99)
POTASSIUM: 4.9 mmol/L (ref 3.5–5.1)
Sodium: 137 mmol/L (ref 135–145)

## 2015-02-09 LAB — URINE MICROSCOPIC-ADD ON

## 2015-02-09 LAB — GLUCOSE, CAPILLARY
GLUCOSE-CAPILLARY: 159 mg/dL — AB (ref 65–99)
Glucose-Capillary: 156 mg/dL — ABNORMAL HIGH (ref 65–99)
Glucose-Capillary: 284 mg/dL — ABNORMAL HIGH (ref 65–99)
Glucose-Capillary: 138 mg/dL — ABNORMAL HIGH (ref 65–99)

## 2015-02-09 NOTE — Progress Notes (Signed)
Patient received IV bolus of NS 200 mL around 1915 by previous RN, then continuous rate initiated at 75 mL/ hour afterward by previous RN. Patient stable with no complaints. Wanted to eat dinner tray at 2100, ate approx. 75% sitting on the edge of bed. Pt fell asleep shortly after. HS scheduled medications given at 2222 without difficulty. VS taken at 0120 and stable. Will cont to monitor patient. Carolin Quang, Dione Plover

## 2015-02-09 NOTE — Progress Notes (Signed)
Occupational Therapy Session Note  Patient Details  Name: Timothy Lambert MRN: [id number] Date of Birth: 28-Oct-1955  Today's Date: 02/09/2015 OT Individual Time: 0762-2633 OT Individual Time Calculation (min): 24 min    Short Term Goals: Week 1:  OT Short Term Goal 1 (Week 1): Pt. will bathe self at supervision level OT Short Term Goal 1 - Progress (Week 1): Met OT Short Term Goal 2 (Week 1): Pt. will dress self at supervision level OT Short Term Goal 2 - Progress (Week 1): Met OT Short Term Goal 3 (Week 1): Pt. will show better impulse control by not getting up w/o supervision. OT Short Term Goal 3 - Progress (Week 1): Met OT Short Term Goal 4 (Week 1): pt will transfer to toilet with SBA OT Short Term Goal 4 - Progress (Week 1): Met OT Short Term Goal 5 (Week 1): Pt will transfer to shower bench with SBA OT Short Term Goal 5 - Progress (Week 1): Met Week 2:  OT Short Term Goal 1 (Week 2): STG = LTGs due to remaining LOS  Skilled Therapeutic Interventions/Progress Updates:  Upon entering the room, pt supine in bed sleeping and appearing to be lethargic at times during session. He closed eyes many times and appeared to be falling asleep. Pt agreeable to OT session after coaxing and maximal encouragement. Supine >sit with supervision. While seated EOB, pt removed socks and donned B shoes but required assist to tie laces. Pt disoriented to location and time during session. He was unable to report therapist name ~ 5 minutes after it was given. Pt ambulated with RW ~ 100' to gym and paused once for rest break. Pt having scissor gait when ambulating to and making directions turns. Pt requiring cues to be inside of RW and to keep head up when ambulating as well. Once reaching the gym, pt reporting he was tired and wanted to go back to bed. Pt refused all other activity. Pt returned in same manner as stated above. Sit >supine with supervision. Pt closed eyes and appeared to be asleep as soon as  laying back down. Call bell within reach and bed alarm on upon exiting the room.   Therapy Documentation Precautions:  Precautions Precautions: Fall, Other (comment) Precaution Comments: Zoll Lifevest Restrictions Weight Bearing Restrictions: No General: General OT Amount of Missed Time: 6 Minutes Vital Signs: Therapy Vitals Pulse Rate: 89 Resp: 20 BP: 111/65 mmHg Oxygen Therapy SpO2: 100 %  See FIM for current functional status  Therapy/Group: Individual Therapy  Phineas Semen 02/09/2015, 12:27 PM

## 2015-02-09 NOTE — Progress Notes (Signed)
Patient ID: Timothy Lambert, male   DOB: [birth year], 59 y.o.   MRN: [id number]  02/09/15.  [place] PHYSICAL MEDICINE & REHABILITATION     PROGRESS NOTE 59 y.o. right handed male with history of hypertension, alcohol abuse, diabetes mellitus and coronary artery disease maintained on aspirin.  Presented to [facility] 58/12/2014 with seizures. Extremely agitated requiring intubation for airway protection. Alcohol level and drug screen negative. CT of the head negative for acute changes. EKG with precordial ST changes concerning for ischemia/infarct. Transferred to [facility] for [facility] care. After arriving to [facility] repeat EKG with worsened ST elevations and T-wave inversions troponin greater than 65. Also noted blood pressure in the high 200s over 100s and glucose in the 400s. Underwent emergent cardiac catheterization findings of 99% LAD stenosis with stenting. He was also fitted with a life vest. Echocardiogram with ejection fraction [id number] grade 1 diastolic dysfunction. MRI 01/24/2015 showing multiple small foci of acute ischemia spanning multiple vascular territories consistent with embolic event.  Patient noted yesterday to have increased potassium of 6.1 and elevated creatinine of 2.56.  Patient has been treated with IV fluids through the night.  EKG reviewed this morning that revealed evolving ST-T wave changes from recent acute anterior MI.  Patient has no complaints of chest pain  Past Medical History  Diagnosis Date  . Coronary artery disease   . Hypertension   . Diabetes mellitus without complication   . Stroke   . Seizures   . Myocardial infarction       Subjective/Complaints: Lying flat in bed without distress.  Denies abdominal pain, shortness of breath or nausea No c/os   ROS:   No SOB, No CP, no abd pain  Objective: Vital Signs: Blood pressure 131/61, pulse 86, temperature 97.4 F (36.3 C), temperature source Axillary, resp. rate 20, weight 141 lb 15.6 oz  (64.4 kg), SpO2 99 %. No results found.  Recent Labs  02/08/15 1700  WBC 10.1  HGB 11.3*  HCT 32.4*  PLT 384    Recent Labs  02/08/15 1700 02/09/15 0649  NA 135 137  K 6.1* 4.9  CL 100* 104  GLUCOSE 171* 160*  BUN 54* 47*  CREATININE 2.56* 1.96*  CALCIUM 9.3 8.2*   CBG (last 3)   Recent Labs  02/08/15 1704 02/08/15 2113 02/09/15 0704  GLUCAP 156* 140* 156*    Wt Readings from Last 3 Encounters:  02/02/15 141 lb 15.6 oz (64.4 kg)  02/01/15 141 lb 8.6 oz (64.2 kg)   Patient Vitals for the past 24 hrs:  BP Temp Temp src Pulse Resp SpO2  02/09/15 0622 131/61 mmHg 97.4 F (36.3 C) Axillary 86 20 99 %  02/09/15 0120 106/66 mmHg 98.7 F (37.1 C) Oral 86 20 95 %  02/08/15 1423 (!) 81/54 mmHg 98.5 F (36.9 C) Oral 86 18 100 %     Intake/Output Summary (Last 24 hours) at 02/09/15 0934 Last data filed at 02/09/15 0325  Gross per 24 hour  Intake    500 ml  Output    800 ml  Net   -300 ml     CBG (last 3)   Recent Labs  02/08/15 1704 02/08/15 2113 02/09/15 0704  GLUCAP 156* 140* 156*    Physical Exam:  HENT:   Edentulous Head: Normocephalic.  Eyes:  Pupil reactive to light   Neck: Normal range of motion. Neck supple. No thyromegaly present.  Dysphonic voice persistent Cardiovascular: Normal rate and regular rhythm. Life  vest in place Respiratory: Effort normal and breath sounds normal. No respiratory distress.  GI: Soft. Bowel sounds are normal. He exhibits no distension.  Neurological:  Patient is alert, less restless  Moves all 4's with at least 4/5 strength proximal to distal in upper/lower limbs.   Skin: Skin is warm and dry.  Psychiatric:  Pleasant and cooperative in general   Assessment/Plan: 1. 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  Fitted with LifeVest 2.  Hyperkalemia/acute renal failure- improved with hydration and discontinuation of Lasix.  We'll continue to carefully rehydrate and hold furosemide.  Recheck  labs in a.m. 3. DVT Prophylaxis/Anticoagulation: Aspirin/Brilinta as well as SCDs. Monitor for any signs of DVT  4. Seizure disorder. Keppra 500 mg twice a day. EEG negative.  5. Fluids/Electrolytes/Nutrition: Routine I&O's with follow-up chemistries  -inadequate intake presently 6. Dysphagia. Dysphagia 3 nectar liquids. Follow-up speech therapy, cough is dysphagia (trouble handling saliva) related do not suspect PNA at this point, vocal hoarseness, ? Vocal cord edema or paralysis,? Emergent intubation will need ENT f/u 7. Hypertension. Coreg 25 mg twice a day, lisinopril 10 mg daily. Monitor with increased mobility 8. Diabetes mellitus and peripheral neuropathy. Hemoglobin A1c 9.3. Lantus insulin 14 units daily. Check blood sugars before meals and at bedtime.  -sugars fairly controlled at present.   -increase lantus to 26u qam  (on 14u at home)  9. Hyperlipidemia. Lipitor   LOS (Days) 8 A FACE TO FACE EVALUATION WAS PERFORMED  Nyoka Cowden 02/09/2015 9:28 AM

## 2015-02-10 ENCOUNTER — Inpatient Hospital Stay (HOSPITAL_COMMUNITY): Payer: Self-pay | Admitting: Occupational Therapy

## 2015-02-10 ENCOUNTER — Encounter (HOSPITAL_COMMUNITY): Payer: Self-pay | Admitting: Occupational Therapy

## 2015-02-10 DIAGNOSIS — I509 Heart failure, unspecified: Secondary | ICD-10-CM

## 2015-02-10 DIAGNOSIS — I5022 Chronic systolic (congestive) heart failure: Secondary | ICD-10-CM

## 2015-02-10 LAB — GLUCOSE, CAPILLARY
GLUCOSE-CAPILLARY: 112 mg/dL — AB (ref 65–99)
Glucose-Capillary: 240 mg/dL — ABNORMAL HIGH (ref 65–99)
Glucose-Capillary: 223 mg/dL — ABNORMAL HIGH (ref 65–99)
Glucose-Capillary: 217 mg/dL — ABNORMAL HIGH (ref 65–99)
Glucose-Capillary: 255 mg/dL — ABNORMAL HIGH (ref 65–99)

## 2015-02-10 NOTE — Progress Notes (Signed)
Patient Name: Timothy Lambert Date of Encounter: 02/10/2015  Primary Cardiologist: Aundra Dubin    SUBJECTIVE  Denies any CP or SOB.   CURRENT MEDS . aspirin  81 mg Oral Daily  . atorvastatin  80 mg Oral q1800  . carvedilol  25 mg Oral BID WC  . famotidine  20 mg Oral BID  . folic acid  1 mg Oral Daily  . guaifenesin  100 mg Oral BID  . insulin aspart  0-15 Units Subcutaneous TID WC  . insulin glargine  24 Units Subcutaneous Daily  . levETIRAcetam  500 mg Oral Q12H  . lisinopril  10 mg Oral Daily  . sodium chloride  200 mL Intravenous Once  . thiamine  100 mg Oral Daily  . ticagrelor  90 mg Oral BID    OBJECTIVE  Filed Vitals:   02/09/15 0622 02/09/15 0936 02/09/15 1326 02/10/15 0603  BP: 131/61 111/65 115/71 129/72  Pulse: 86 89 90 83  Temp: 97.4 F (36.3 C)  97.7 F (36.5 C) 99 F (37.2 C)  TempSrc: Axillary  Oral Oral  Resp: 20 20 20 20   Weight:      SpO2: 99% 100% 100% 100%    Intake/Output Summary (Last 24 hours) at 02/10/15 1213 Last data filed at 02/09/15 1903  Gross per 24 hour  Intake    480 ml  Output   1550 ml  Net  -1070 ml   Filed Weights   02/02/15 0445  Weight: 141 lb 15.6 oz (64.4 kg)    PHYSICAL EXAM  General: Pleasant, NAD. Psych: Normal affect. HEENT:  Normal. Neck: Supple  Lungs:  CTA Heart: RRR  Abdomen: Soft, non-tender, non-distended Extremities: No edema.   Accessory Clinical Findings  CBC  Recent Labs  02/08/15 1700  WBC 10.1  HGB 11.3*  HCT 32.4*  MCV 86.9  PLT 0000000   Basic Metabolic Panel  Recent Labs  02/08/15 1700 02/09/15 0649  NA 135 137  K 6.1* 4.9  CL 100* 104  CO2 24 24  GLUCOSE 171* 160*  BUN 54* 47*  CREATININE 2.56* 1.96*  CALCIUM 9.3 8.2*    Echocardiogram 01/24/2015  LV EF: 30% -  35%  ------------------------------------------------------------------- Indications:   MI - acute 410.91.  ------------------------------------------------------------------- History:  PMH:   Altered mental status. Risk factors: Hypertension. Diabetes mellitus.  ------------------------------------------------------------------- Study Conclusions  - Left ventricle: The cavity size was normal. Wall thickness was increased in a pattern of moderate LVH. Systolic function was moderately to severely reduced. The estimated ejection fraction was in the range of 30% to 35%. Mid to apical anteroseptal akinesis, apical lateral akinesis, apical inferior akinesis, akinesis of true apex. Anterior hypokinesis. Doppler parameters are consistent with abnormal left ventricular relaxation (grade 1 diastolic dysfunction). - Aortic valve: There was no stenosis. - Mitral valve: There was trivial regurgitation. - Left atrium: The atrium was mildly dilated. - Right ventricle: The cavity size was normal. Systolic function was normal. - Pulmonary arteries: PA peak pressure: 66 mm Hg (S). - Systemic veins: IVC measured 2.5 cm with < 50% respirophasic variation, suggesting RA pressure 15 mmHg. - Pericardium, extracardiac: A trivial pericardial effusion was identified.  Impressions:  - Normal LV size with moderate LV hypertrophy. EF 30-35% with wall motion abnormalities as noted above. Normal RV size and systolic function. No significant valvular abnormalities. Moderate pulmonary hypertension.     ASSESSMENT AND PLAN  59 yo male brought to [facility] 5/4 with seizure. Noted to have AMS and extremely  agitated requiring intubation. EKG showed anterior ST elevation with trop >65. Pt taken for emergent cath. Cath 5/4 99% mid/distal LAD treated with DES, 50% L main dx, 90% D1 stenosis, 75% diffuse ramus, 50% OM1, 70% mid RCA stenosis, 40% RPDA. EEG obtained on 5/4 abnormal with mild to moderate cerebral disturbance but no seizure. MRI of brain with multiple small foci of acute ischemia c/w embolic event. Echo 5/5 EF 30-35%. Transferred to rehab; had acute renal insuff and  hyperkalemia; patient being hydrated and lasix DCed with improvement  1. Anterior STEMI  - Cath 5/4 99% mid/distal LAD treated with DES, 50% L main dx, 90% D1 stenosis, 75% diffuse ramus, 50% OM1, 70% mid RCA stenosis, 40% RPDA  - Life vest in place, Repeat echo in 3 month, if low need ICD.   - continue ASA, brilinta, statin, coreg and lisinopril.  2. Acute systolic and diastolic HF  - now improved; patient developed renal insuff with lasix now on hold. Continue IVFs today and recheck Bmet in AM; can most likely DC IVFs tomorrow AM  - Echo 01/24/2015 EF 30-35%, PA peak pressure 66mg , significant wall motion abnormalities.  3. Acute CVA, likely embolic  - no LV thrombus noted on 2D echo, continue ASA, Brilinta. Not good anticoagulation candidate given his EtOH abuse  4. Hypertensive emergency.  -BP now controlled  6. ETOH abuse with h/o seizure  7. Proxysmal atrial flutter:  - CHA2-DS2-VAsc score 4 (HTN, HF, stroke)  - Brief AFlutter 01/30/15  - not candidate for systemic anticoagulation  Signed, Kirk Ruths MD

## 2015-02-10 NOTE — Progress Notes (Signed)
Occupational Therapy Session Note  Patient Details  Name: Timothy Lambert MRN: [id number] Date of Birth: 1955-11-02  Today's Date: 02/10/2015 OT Individual Time: 1030-1130 and [id number] OT Individual Time Calculation (min): 60 min and 35 min   Short Term Goals: Week 2:  OT Short Term Goal 1 (Week 2): STG = LTGs due to remaining LOS  Skilled Therapeutic Interventions/Progress Updates:    1) Engaged in therapeutic activity with focus on initiation and attention to task during table top activity.  Pt received at RN station, reporting having been up for 4 hours and requesting to return to bed.  Pt willing to participate with therapy session, requesting water and to be in Dayroom.  Engaged in "blink" card matching activity from previous session while pt consumed nectar thick water.  Pt able to read directions with cues for vocal intensity, followed by max faded to mod cues for completion of task with pt demonstrating fluctuating motivation.  Pt requested to attempt novel card task on table requiring max cues but increased participation and interest in task.  Short periods of standing during card activity to continue to address activity tolerance.  Returned to room and left supine in bed with all needs in reach.   2) Engaged in therapeutic activity with focus on functional mobility with RW and vocal intensity with conversation.  Ambulated around RN station with RW with min-mod assist over thresholds and carpet with pt demonstrating decreased problem solving and impulsivity with obstacle negotiations with pt tending to push RW too far in front of self to negotiate obstacles.  Educated on sidestepping and moving furniture to increase safety with mobility in home setting.  Wife present throughout session providing encouragement for pt to increase volume of vocalizations.    Therapy Documentation Precautions:  Precautions Precautions: Fall, Other (comment) Precaution Comments: Zoll  Lifevest Restrictions Weight Bearing Restrictions: No Pain: Pain Assessment Pain Score: 0-No pain Faces Pain Scale: No hurt PAINAD (Pain Assessment in Advanced Dementia) Breathing: normal  See FIM for current functional status  Therapy/Group: Individual Therapy  Simonne Come 02/10/2015, 12:11 PM

## 2015-02-10 NOTE — Progress Notes (Signed)
Patient ID: Timothy Lambert, male   DOB: April 18, 1956, 59 y.o.   MRN: [id number]  Patient ID: Timothy Lambert, male   DOB: [birth year], 59 y.o.   MRN: [id number]  02/10/15.  [place] [facility]     PROGRESS NOTE 59 y.o. right handed male with history of hypertension, alcohol abuse, diabetes mellitus and coronary artery disease maintained on aspirin.  Presented to [facility] 01/23/2015 with seizures. Extremely agitated requiring intubation for airway protection. Alcohol level and drug screen negative. CT of the head negative for acute changes. EKG with precordial ST changes concerning for ischemia/infarct. Transferred to [facility] for ongoing care. After arriving to [facility] repeat EKG with worsened ST elevations and T-wave inversions troponin greater than 65. Also noted blood pressure in the high 200s over 100s and glucose in the 400s. Underwent emergent cardiac catheterization findings of 99% LAD stenosis with stenting. He was also fitted with a life vest. Echocardiogram with ejection fraction [id number] grade 1 diastolic dysfunction. MRI 01/24/2015 showing multiple small foci of acute ischemia spanning multiple vascular territories consistent with embolic event.  Patient noted 02/08/15  to have increased potassium of 6.1 and elevated creatinine of 2.56.  Patient has been treated with IV fluids through the night.  EKG reviewed revealed evolving ST-T wave changes from recent acute anterior MI.  Patient has no complaints of chest pain.  Patient continues to receive IV fluids.  Repeat Bmet this a.m. is pending  Past Medical History  Diagnosis Date  . Coronary artery disease   . Hypertension   . Diabetes mellitus without complication   . Stroke   . Seizures   . Myocardial infarction       Subjective/Complaints: Lying flat in bed without distress.  Denies abdominal pain, shortness of breath or nausea No c/os   ROS:   No SOB, No CP, no abd pain  Objective: Vital  Signs: Blood pressure 129/72, pulse 83, temperature 99 F (37.2 C), temperature source Oral, resp. rate 20, weight 141 lb 15.6 oz (64.4 kg), SpO2 100 %. No results found.  Recent Labs  02/08/15 1700  WBC 10.1  HGB 11.3*  HCT 32.4*  PLT 384    Recent Labs  02/08/15 1700 02/09/15 0649  NA 135 137  K 6.1* 4.9  CL 100* 104  GLUCOSE 171* 160*  BUN 54* 47*  CREATININE 2.56* 1.96*  CALCIUM 9.3 8.2*   CBG (last 3)   Recent Labs  02/09/15 1822 02/09/15 2144 02/10/15 0656  GLUCAP 159* 138* 112*    Wt Readings from Last 3 Encounters:  02/02/15 141 lb 15.6 oz (64.4 kg)  02/01/15 141 lb 8.6 oz (64.2 kg)   Patient Vitals for the past 24 hrs:  BP Temp Temp src Pulse Resp SpO2  02/10/15 0603 129/72 mmHg 99 F (37.2 C) Oral 83 20 100 %  02/09/15 1326 115/71 mmHg 97.7 F (36.5 C) Oral 90 20 100 %  02/09/15 0936 111/65 mmHg - - 89 20 100 %     Intake/Output Summary (Last 24 hours) at 02/10/15 0834 Last data filed at 02/09/15 1903  Gross per 24 hour  Intake    480 ml  Output   1550 ml  Net  -1070 ml     CBG (last 3)   Recent Labs  02/09/15 1822 02/09/15 2144 02/10/15 0656  GLUCAP 159* 138* 112*    Physical Exam:  HENT:   Edentulous Head: Normocephalic.  Eyes:  Pupil reactive to light  Neck: Normal range of motion. Neck supple. No thyromegaly present.  Dysphonic voice persistent Cardiovascular: Normal rate and regular rhythm. Life vest in place Respiratory: Effort normal.  Scattered upper airway rhonchi but does not sound wet GI: Soft. Bowel sounds are normal. He exhibits no distension.  Neurological:  Patient is alert, less restless  Moves all 4's with at least 4/5 strength proximal to distal in upper/lower limbs.   Skin: Skin is warm and dry.  Psychiatric:  Pleasant and cooperative in general   Assessment/Plan: 1. 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  Fitted with LifeVest 2.  Hyperkalemia/acute renal failure- improved  with hydration and discontinuation of Lasix.  We'll continue to carefully rehydrate and hold furosemide.  Recheck labs in a.m. 3. DVT Prophylaxis/Anticoagulation: Aspirin/Brilinta as well as SCDs. Monitor for any signs of DVT  4. Seizure disorder. Keppra 500 mg twice a day. EEG negative.  5. Fluids/Electrolytes/Nutrition: Routine I&O's with follow-up chemistries  By mouth intake seems to be improving 6. Dysphagia. Dysphagia 3 nectar liquids. Follow-up speech therapy, cough is dysphagia (trouble handling saliva) related do not suspect PNA at this point, vocal hoarseness, ? Vocal cord edema or paralysis,? Emergent intubation will need ENT f/u 7. Hypertension. Coreg 25 mg twice a day, lisinopril 10 mg daily. Monitor with increased mobility 8. Diabetes mellitus and peripheral neuropathy. Hemoglobin A1c 9.3. Lantus insulin 14 units daily. Check blood sugars before meals and at bedtime.  -sugars fairly controlled at present.   -increase lantus to 26u qam  (on 14u at home)  9. Hyperlipidemia. Lipitor   LOS (Days) 9 A FACE TO FACE EVALUATION WAS PERFORMED  Nyoka Cowden 02/10/2015 8:34 AM

## 2015-02-11 ENCOUNTER — Inpatient Hospital Stay (HOSPITAL_COMMUNITY): Payer: Medicaid Other | Admitting: Physical Therapy

## 2015-02-11 ENCOUNTER — Inpatient Hospital Stay (HOSPITAL_COMMUNITY): Payer: Self-pay | Admitting: Occupational Therapy

## 2015-02-11 ENCOUNTER — Inpatient Hospital Stay (HOSPITAL_COMMUNITY): Payer: Medicaid Other | Admitting: Speech Pathology

## 2015-02-11 DIAGNOSIS — I5021 Acute systolic (congestive) heart failure: Secondary | ICD-10-CM

## 2015-02-11 DIAGNOSIS — I6931 Cognitive deficits following cerebral infarction: Secondary | ICD-10-CM

## 2015-02-11 LAB — GLUCOSE, CAPILLARY
Glucose-Capillary: 155 mg/dL — ABNORMAL HIGH (ref 65–99)
Glucose-Capillary: 189 mg/dL — ABNORMAL HIGH (ref 65–99)
Glucose-Capillary: 145 mg/dL — ABNORMAL HIGH (ref 65–99)
Glucose-Capillary: 238 mg/dL — ABNORMAL HIGH (ref 65–99)

## 2015-02-11 LAB — BASIC METABOLIC PANEL
ANION GAP: 6 (ref 5–15)
BUN: 22 mg/dL — AB (ref 6–20)
CO2: 23 mmol/L (ref 22–32)
Calcium: 8.3 mg/dL — ABNORMAL LOW (ref 8.9–10.3)
Chloride: 108 mmol/L (ref 101–111)
Creatinine, Ser: 1.24 mg/dL (ref 0.61–1.24)
Glucose, Bld: 175 mg/dL — ABNORMAL HIGH (ref 65–99)
POTASSIUM: 4.7 mmol/L (ref 3.5–5.1)
SODIUM: 137 mmol/L (ref 135–145)

## 2015-02-11 LAB — URINE CULTURE: Colony Count: >=100000

## 2015-02-11 MED ORDER — SULFAMETHOXAZOLE-TRIMETHOPRIM 800-160 MG PO TABS
1.0000 | ORAL_TABLET | Freq: Two times a day (BID) | ORAL | Status: DC
  Administered 2015-02-11 – 2015-02-14 (×6): 1 via ORAL
  Filled 2015-02-11 (×8): qty 1

## 2015-02-11 NOTE — Progress Notes (Signed)
Subjective/Complaints: Appreciate cardiology note Vocal volume low, hoarse  ROS:   No SOB, No CP, no abd pain  Objective: Vital Signs: Blood pressure 143/73, pulse 90, temperature 99 F (37.2 C), temperature source Oral, resp. rate 20, weight 64.4 kg (141 lb 15.6 oz), SpO2 100 %. No results found.  Recent Labs  02/08/15 1700  WBC 10.1  HGB 11.3*  HCT 32.4*  PLT 384    Recent Labs  02/08/15 1700 02/09/15 0649  NA 135 137  K 6.1* 4.9  CL 100* 104  GLUCOSE 171* 160*  BUN 54* 47*  CREATININE 2.56* 1.96*  CALCIUM 9.3 8.2*   CBG (last 3)   Recent Labs  02/10/15 1839 02/10/15 2110 02/11/15 0700  GLUCAP 217* 255* 155*    Wt Readings from Last 3 Encounters:  02/02/15 64.4 kg (141 lb 15.6 oz)  02/01/15 64.2 kg (141 lb 8.6 oz)    Physical Exam:  HENT:  Head: Normocephalic.  Eyes:  Pupil reactive to light   Neck: Normal range of motion. Neck supple. No thyromegaly present.  Dysphonic voice persistent Cardiovascular: Normal rate and regular rhythm.  Respiratory: Effort normal and breath sounds normal. No respiratory distress.  GI: Soft. Bowel sounds are normal. He exhibits no distension.  Neurological:  Patient is alert, less restless and but still impulsive.  Limited awareness of his deficits. Followed simple commands. Moves all 4's with at least 4/5 strength proximal to distal in upper/lower limbs.  Difficult to assess visual fields looks like bitemporal field cut but cooperation with testing is questionable Skin: Skin is warm and dry.  Psychiatric:  Pleasant and cooperative in general   Assessment/Plan: 1. 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 Physiatrist is providing close team supervision and 24 hour management of active medical problems listed below. Physiatrist and rehab team continue to assess barriers to  discharge/monitor patient progress toward functional and medical goals. Will have life vest for 3 months, ICD if repeat ECHO does not show improvement FIM: FIM - Bathing Bathing Steps Patient Completed: Chest, Right Arm, Left Arm, Abdomen, Buttocks, Right upper leg, Left upper leg, Front perineal area, Right lower leg (including foot), Left lower leg (including foot) Bathing: 5: Supervision: Safety issues/verbal cues  FIM - Upper Body Dressing/Undressing Upper body dressing/undressing steps patient completed: Thread/unthread right sleeve of pullover shirt/dresss, Thread/unthread left sleeve of pullover shirt/dress, Put head through opening of pull over shirt/dress, Pull shirt over trunk, Thread/unthread right sleeve of front closure shirt/dress, Thread/unthread left sleeve of front closure shirt/dress, Pull shirt around back of front closure shirt/dress, Button/unbutton shirt Upper body dressing/undressing: 5: Set-up assist to: Obtain clothing/put away FIM - Lower Body Dressing/Undressing Lower body dressing/undressing steps patient completed: Thread/unthread right pants leg, Thread/unthread left pants leg, Pull pants up/down, Don/Doff right sock, Don/Doff left sock, Don/Doff right shoe, Don/Doff left shoe, Fasten/unfasten right shoe, Fasten/unfasten left shoe, Thread/unthread right underwear leg, Thread/unthread left underwear leg, Pull underwear up/down Lower body dressing/undressing: 5: Set-up assist to: Obtain clothing  FIM - Toileting Toileting steps completed by patient: Performs perineal hygiene Toileting Assistive Devices: Grab bar or rail for support Toileting: 2: Max-Patient completed 1 of 3 steps  FIM - Radio producer Devices: Grab bars, Insurance account manager Transfers: 3-To toilet/BSC: Mod A (lift or lower assist), 3-From toilet/BSC: Mod A (lift or lower assist)  FIM - Control and instrumentation engineer Devices: Adult nurse  Transfer: 5:  Supine > Sit: Supervision (verbal cues/safety issues), 5: Sit > Supine: Supervision (verbal cues/safety issues), 5: Bed > Chair or W/C: Supervision (verbal cues/safety issues), 5: Chair or W/C > Bed: Supervision (verbal cues/safety issues)  FIM - Locomotion: Wheelchair Distance: 150 Locomotion: Wheelchair: 5: Travels 150 ft or more: maneuvers on rugs and over door sills with supervision, cueing or coaxing FIM - Locomotion: Ambulation Locomotion: Ambulation Assistive Devices:  (no AD) Ambulation/Gait Assistance: 3: Mod assist, 2: Max assist Locomotion: Ambulation: 1: Travels less than 50 ft with maximal assistance (Pt: 25 - 49%)  Comprehension Comprehension Mode: Auditory Comprehension: 4-Understands basic 75 - 89% of the time/requires cueing 10 - 24% of the time  Expression Expression Mode: Verbal Expression: 3-Expresses basic 50 - 74% of the time/requires cueing 25 - 50% of the time. Needs to repeat parts of sentences.  Social Interaction Social Interaction: 4-Interacts appropriately 75 - 89% of the time - Needs redirection for appropriate language or to initiate interaction.  Problem Solving Problem Solving: 3-Solves basic 50 - 74% of the time/requires cueing 25 - 49% of the time  Memory Memory: 2-Recognizes or recalls 25 - 49% of the time/requires cueing 51 - 75% of the time Medical Problem List and Plan: 1. Functional deficits secondary to embolic CVA/ s/pSTEMI status post stenting. Fitted with LifeVest  -hold on showering , vest can come off for 32min, per OT bathing time is ~108min, pt is slow 2. DVT Prophylaxis/Anticoagulation: Aspirin/Brilinta as well as SCDs. Monitor for any signs of DVT 3. Pain Management: Tylenol as needed 4. Seizure disorder. Keppra 500 mg twice a day. EEG negative. 5. Neuropsych: This patient is capable of making decisions on his own behalf.  -bed alarm, ?mittens for safety---close observation/safety plan 6. Skin/Wound Care: Routine skin checks 7.  Fluids/Electrolytes/Nutrition: Routine I&O's with follow-up chemistries  -inadequate intake presently 8. Dysphagia. Dysphagia 3 nectar liquids. Follow-up speech therapy, cough is dysphagia (trouble handling saliva) related do not suspect PNA at this point, vocal hoarseness, ? Vocal cord edema or paralysis,? Emergent intubation will need ENT f/u 9. Hypertension. Coreg 25 mg twice a day, lisinopril 10 mg daily. Monitor with increased mobility 10. Diabetes mellitus and peripheral neuropathy. Hemoglobin A1c 9.3.  Check blood sugars before meals and at bedtime.  -sugars fairly controlled at present.   -increase lantus to 30u qam  (on 14u at home) 11. Alcohol abuse. Monitor for any signs of withdrawal provide counseling 12. Hyperlipidemia. Lipitor   LOS (Days) 10 A FACE TO FACE EVALUATION WAS PERFORMED  Charlett Blake 02/11/2015 7:41 AM

## 2015-02-11 NOTE — Progress Notes (Signed)
Occupational Therapy Session Note  Patient Details  Name: Timothy Lambert MRN: [id number] Date of Birth: 12/27/1955  Today's Date: 02/11/2015 OT Individual Time: 0730-0900 OT Individual Time Calculation (min): 90 min    Short Term Goals: Week 2:  OT Short Term Goal 1 (Week 2): STG = LTGs due to remaining LOS  Skilled Therapeutic Interventions/Progress Updates:    Engaged in ADL retraining with focus on functional transfers, sit <> stand, and attention to task.  Pt more alert this session and IV fluids discontinued therefore plan to engage in bathing at shower level.  Discussed with pt parameters of removing Life Vest for only 10 minutes therefore important to maintain attention to current task.  Pt reports understanding.  Ambulated to room shower with RW and all needs setup.  Min assist with ambulation and turns, improved safety awareness with turns when compared to previous session.  Pt doffed clothes and Life Vest last, with assistance to manage the hook closure.  Pt bathed at sit > stand level with close supervision and min cues for time.  Completed UB drying in shower to don Life Vest within time constraints, requiring assist to hook front closure.  Dressing completed at sit > stand level at sink.  Utilized step stool to assist with donning and fastening shoes.  Setup for breakfast with pt able to open containers and demonstrated intermittent throat clear without additional cues. Discussed home bathroom setup and recommendation of supervision with all mobility due to fall risk.  Therapy Documentation Precautions:  Precautions Precautions: Fall, Other (comment) Precaution Comments: Zoll Lifevest Restrictions Weight Bearing Restrictions: No General:   Vital Signs: Therapy Vitals Temp: 99 F (37.2 C) Temp Source: Oral Pulse Rate: 90 Resp: 20 BP: (!) 143/73 mmHg Patient Position (if appropriate): Lying Oxygen Therapy SpO2: 100 % O2 Device: Not Delivered Pain:  Pt with no c/o  pain  See FIM for current functional status  Therapy/Group: Individual Therapy  Simonne Come 02/11/2015, 8:32 AM

## 2015-02-11 NOTE — Plan of Care (Signed)
Problem: RH Laundry Goal: LTG Patient will perform laundry w/assist, cues (OT) LTG: Patient will perform laundry with assistance, with/without cues (OT).  Outcome: Not Applicable Date Met:  38/75/64 D/C due to pt request and not a focus at this time.  Problem: RH Light Housekeeping Goal: LTG Patient will perform light housekeeping w/assist (OT) LTG: Patient will perform light housekeeping with assistance, with/without cues (OT).  Outcome: Not Applicable Date Met:  33/29/51 D/C due to pt request and not a focus at this time.

## 2015-02-11 NOTE — Progress Notes (Signed)
Speech Language Pathology Daily Session Note  Patient Details  Name: Timothy Lambert MRN: [id number] Date of Birth: 11-Aug-1956  Today's Date: 02/11/2015 SLP Individual Time: 1102-1200 SLP Individual Time Calculation (min): 58 min  Short Term Goals: Week 2: SLP Short Term Goal 1 (Week 2): Patient will consume current diet with minimal overt s/s of aspiration with Min A verbal cues for use of swallow strategies.  SLP Short Term Goal 2 (Week 2): Patient will consume trials of thin liquids without overt s/s of aspiration to assess readiness for repeat objective swallow study with Mod A verbal cues.  SLP Short Term Goal 3 (Week 2): Patient will utilize an increased vocal intensity at the word level with Min A verbal cues to increase intelligibility to 75%.  SLP Short Term Goal 4 (Week 2): Patient will utilize call bell to request assistance with Mod A multimodal cues in 50% of observable opportunities.  SLP Short Term Goal 5 (Week 2): Patient will identify 2 physical and 2 cognitive deficits with Mod A multimodal cues.  SLP Short Term Goal 6 (Week 2): Patient will utilize external memory aids to recall new, daily information with Mod A multimodal cues.   Skilled Therapeutic Interventions:  Pt was seen for skilled ST targeting goals for dysphagia and cognition.  Upon arrival, pt was sitting in recliner with quick release belt donned.  Pt was asleep but easily awakened to voice and light touch and agreeable to participate in ST.  Pt was noted with significantly improved affect in comparison to previous therapy sessions with improved response time and motivation to participate in therapies.  SLP facilitated the session with a structured barrier activity targeting functional problem solving and use of dysarthria strategies.  Pt attended to the task in a mildly distracting environment for 30 minutes with no cuing needed for redirection to task.  Pt benefited from min assist verbal cues for mental flexibility  and thoroughness when describing objects and concepts to the SLP during the abovementioned task.  Additionally, pt increased vocal intensity with min assist faded to supervision verbal cues.  Of note, pt's vocal intensity was noted to increase as pt became more engaged/interested in task.  SLP also completed skilled observations with presentations of pt's currently prescribed diet.  Pt consumed dys 3 textures and nectar thick liquids with overall supervision cues for use of rate/portion control.  Pt presented with minimal buccal residue post swallow which cleared with spontaneous use of liquid wash.  Pt's coughing was also much improved today in comparison to last week.  If his respirations continue to sound clear, SLP will resume thin liquid trials at next available appointment.   Continue per current plan of care.     FIM:  Comprehension Comprehension Mode: Auditory Comprehension: 5-Understands basic 90% of the time/requires cueing < 10% of the time Expression Expression Mode: Verbal Expression: 4-Expresses basic 75 - 89% of the time/requires cueing 10 - 24% of the time. Needs helper to occlude trach/needs to repeat words. Social Interaction Social Interaction: 4-Interacts appropriately 75 - 89% of the time - Needs redirection for appropriate language or to initiate interaction. Problem Solving Problem Solving: 4-Solves basic 75 - 89% of the time/requires cueing 10 - 24% of the time Memory Memory: 2-Recognizes or recalls 25 - 49% of the time/requires cueing 51 - 75% of the time FIM - Eating Eating Activity: 5: Set-up assist for open containers;5: Needs verbal cues/supervision;4: Helper checks for pocketed food  Pain Pain Assessment Pain Assessment: No/denies pain  Therapy/Group: Individual Therapy  Timothy Lambert, Selinda Orion 02/11/2015, 12:42 PM

## 2015-02-11 NOTE — Progress Notes (Signed)
Social Work Patient ID: Timothy Lambert, male   DOB: 1956-01-13, 59 y.o.   MRN: [id number] Met with wife who plans to be here most of the week to attend therapies with pt to learn his care.  Faxed in letter for FMLA from [place] regarding wife needing to be out full 12 weeks. [facility] for PCP follow up and assistance with medicines.  Work toward discharge on Thursday.

## 2015-02-11 NOTE — Progress Notes (Signed)
    Subjective: No SOB, orhtopnea, CP  Objective: Vital signs in last 24 hours: Temp:  [98 F (36.7 C)-99 F (37.2 C)] 99 F (37.2 C) (05/23 0500) Pulse Rate:  [78-90] 90 (05/23 0500) Resp:  [18-20] 20 (05/23 0500) BP: (130-143)/(73-74) 143/73 mmHg (05/23 0500) SpO2:  [99 %-100 %] 100 % (05/23 0500) Last BM Date: 02/09/15  Intake/Output from previous day: 05/22 0701 - 05/23 0700 In: 840 [P.O.:840] Out: -  Intake/Output this shift:    Medications Scheduled Meds: . aspirin  81 mg Oral Daily  . atorvastatin  80 mg Oral q1800  . carvedilol  25 mg Oral BID WC  . famotidine  20 mg Oral BID  . folic acid  1 mg Oral Daily  . guaifenesin  100 mg Oral BID  . insulin aspart  0-15 Units Subcutaneous TID WC  . insulin glargine  24 Units Subcutaneous Daily  . levETIRAcetam  500 mg Oral Q12H  . lisinopril  10 mg Oral Daily  . thiamine  100 mg Oral Daily  . ticagrelor  90 mg Oral BID   Continuous Infusions:  PRN Meds:.acetaminophen, docusate, ondansetron **OR** ondansetron (ZOFRAN) IV, RESOURCE THICKENUP CLEAR, sorbitol  PE: General appearance: alert, cooperative and no distress Lungs: clear to auscultation bilaterally Heart: regular rate and rhythm, S1, S2 normal, no murmur, click, rub or gallop Extremities: NO lEE Pulses: 2+ and symmetric Skin: Warm and dry Neurologic: Grossly normal  Lab Results:   Recent Labs  02/08/15 1700  WBC 10.1  HGB 11.3*  HCT 32.4*  PLT 384   BMET  Recent Labs  02/08/15 1700 02/09/15 0649 02/11/15 0640  NA 135 137 137  K 6.1* 4.9 4.7  CL 100* 104 108  CO2 24 24 23   GLUCOSE 171* 160* 175*  BUN 54* 47* 22*  CREATININE 2.56* 1.96* 1.24  CALCIUM 9.3 8.2* 8.3*    Assessment/Plan  1. Anterior STEMI - Cath 5/4 99% mid/distal LAD treated with DES, 50% L main dx, 90% D1 stenosis, 75% diffuse ramus, 50% OM1, 70% mid RCA stenosis, 40% RPDA - Life vest in place, Repeat echo in 3 month, if low need ICD.  -  continue ASA, brilinta, statin, coreg and lisinopril.  2. Acute systolic and diastolic HF - now improved; patient developed renal insuff with lasix now on hold yesterday.  SCr has improved to WNL.  IVFs DCd  - Echo 01/24/2015 EF 30-35%, PA peak pressure 66mg , significant wall motion abnormalities. - monitor weight daily.  Will need to decide on lasix at DC.  Daily vs PRN.  3. Acute CVA, likely embolic - no LV thrombus noted on 2D echo, continue ASA, Brilinta. Not good anticoagulation candidate given his EtOH abuse  4. Hypertensive emergency. -BP now controlled   6. ETOH abuse with h/o seizure  7. Proxysmal atrial flutter: - CHA2-DS2-VAsc score 4 (HTN, HF, stroke) - Brief AFlutter 01/30/15 - not candidate for systemic anticoagulation    LOS: 10 days    HAGER, BRYAN PA-C 02/11/2015 10:33 AM  As above; renal function has improved following hydration and discontinuing of diuretics; agree with DC IVFs; continue remaining cardiac meds; follow exam closely for recurrent CHF. Kirk Ruths

## 2015-02-11 NOTE — Progress Notes (Signed)
Physical Therapy Session Note  Patient Details  Name: Timothy Lambert MRN: [id number] Date of Birth: October 30, 1955  Today's Date: 02/11/2015 PT Individual Time: 0900-1000 PT Individual Time Calculation (min): 60 min   Short Term Goals: Week 2:  PT Short Term Goal 1 (Week 2): = LTG for D/C 02/14/15  Skilled Therapeutic Interventions/Progress Updates:   Pt received after OT session where pt was able to shower for a short amount of time.  Pt more alert today with improved BP readings overall.  Performed gait without AD x 150 to gym with pt continuing to require min-mod A for balance secondary to lateral LOB with head turns, changes in direction and with LE scissoring during gait, as well as mod verbal cues for use of external environment for guidance to find each gym/room.  Performed gait training with focus on weight shifting and control of weight shifting with lateral stepping to L and R x 25' each direction without UE support and focus on maintaining upright gaze and dual task with min A overall.  Also performed training for changes in direction with figure 8 gait around cones initially with cones far apart and gradually moving cones closer with min-mod A without AD with verbal cues for increased step length bilaterally.  Trial of gait with RW for home use (secondary to goals being supervision level): performed gait x 150' x 2 with RW with decreased lateral LOB during head turns and changes in direction but pt required frequent verbal and tactile cues for upright posture, gaze, to maintain safe distance to RW and minimize dependence and pressure through UE.    In gym continued education on falls risk.  Pt participated in floor <> furniture transfer training with therapist verbalizing indications for calling EMS with a fall.  Pt verbalized understanding but with poor recall of reasons.  Pt return demonstrated floor > furniture transfer with supervision but total verbal cues for sequencing.  Also performed  car transfer training with pt performing with supervision and requiring only one verbal cue to remember to un buckle seat belt prior to exiting the car and verbal cues for safe hand placement when standing.  Returned to room and pt left in recliner with feet elevated and quick release belt on.  Will perform trial of pt remaining in recliner in room to rest while remaining OOB in between therapies.  SLP alerted that pt in room in recliner and if pt remains safe then will make adjustments on safety sheet.  Pt left with all items within reach.  Therapy Documentation Precautions:  Precautions Precautions: Fall, Other (comment) Precaution Comments: Zoll Lifevest Restrictions Weight Bearing Restrictions: No Pain: Pain Assessment Pain Assessment: No/denies pain Locomotion : Ambulation Ambulation/Gait Assistance: 4: Min assist;3: Mod assist   See FIM for current functional status  Therapy/Group: Individual Therapy  Raylene Everts [place] 02/11/2015, 1:20 PM

## 2015-02-12 ENCOUNTER — Inpatient Hospital Stay (HOSPITAL_COMMUNITY): Payer: Medicaid Other | Admitting: Physical Therapy

## 2015-02-12 ENCOUNTER — Inpatient Hospital Stay (HOSPITAL_COMMUNITY): Payer: Medicaid Other | Admitting: Speech Pathology

## 2015-02-12 ENCOUNTER — Inpatient Hospital Stay (HOSPITAL_COMMUNITY): Payer: Self-pay | Admitting: Occupational Therapy

## 2015-02-12 DIAGNOSIS — I509 Heart failure, unspecified: Secondary | ICD-10-CM

## 2015-02-12 LAB — GLUCOSE, CAPILLARY
GLUCOSE-CAPILLARY: 213 mg/dL — AB (ref 65–99)
Glucose-Capillary: 162 mg/dL — ABNORMAL HIGH (ref 65–99)
Glucose-Capillary: 166 mg/dL — ABNORMAL HIGH (ref 65–99)
Glucose-Capillary: 136 mg/dL — ABNORMAL HIGH (ref 65–99)

## 2015-02-12 NOTE — Progress Notes (Signed)
Speech Language Pathology Daily Session Note  Patient Details  Name: Timothy Lambert MRN: [id number] Date of Birth: 04-07-1956  Today's Date: 02/12/2015 SLP Individual Time: 1400-1445 SLP Individual Time Calculation (min): 45 min  Short Term Goals: Week 2: SLP Short Term Goal 1 (Week 2): Patient will consume current diet with minimal overt s/s of aspiration with Min A verbal cues for use of swallow strategies.  SLP Short Term Goal 1 - Progress (Week 2): Progressing toward goal SLP Short Term Goal 2 (Week 2): Patient will consume trials of thin liquids without overt s/s of aspiration to assess readiness for repeat objective swallow study with Mod A verbal cues.  SLP Short Term Goal 2 - Progress (Week 2): Progressing toward goal SLP Short Term Goal 3 (Week 2): Patient will utilize an increased vocal intensity at the word level with Min A verbal cues to increase intelligibility to 75%.  SLP Short Term Goal 3 - Progress (Week 2): Progressing toward goal SLP Short Term Goal 4 (Week 2): Patient will utilize call bell to request assistance with Mod A multimodal cues in 50% of observable opportunities.  SLP Short Term Goal 4 - Progress (Week 2): Progressing toward goal SLP Short Term Goal 5 (Week 2): Patient will identify 2 physical and 2 cognitive deficits with Mod A multimodal cues.  SLP Short Term Goal 5 - Progress (Week 2): Progressing toward goal SLP Short Term Goal 6 (Week 2): Patient will utilize external memory aids to recall new, daily information with Mod A multimodal cues.  SLP Short Term Goal 6 - Progress (Week 2): Progressing toward goal  Skilled Therapeutic Interventions: Skilled ST treatment focused on dysphagia and cognition. Upon arrival of SLP, pt was sleeping soundly in bed, but easily awakened to voice and light touch. Pt agreeable to work with unfamiliar therapist.  SLP facilitated session with review of card game used in ST/OT last week to target recall and functional problem  solving. Pt recalled how to play the game with mod-max cues required to remember how to set up the game, then how to proceed after the first hand. Cues required throughout the game to recall ability to match by number, shape, or color. Pt was observed with trial of thin liquids. Cough response noted 2, however, pt has exhibited a nonproductive cough over the past few days, unrelated to po trials. Thin liquid (coke) was left in pt room with note for upright position and 1:1 supervision to finish that drink. RN informed  of this, and that advancing diet to include thin liquids will be deferred to primary SLP.   FIM:  Comprehension Comprehension Mode: Auditory Comprehension: 5-Understands basic 90% of the time/requires cueing < 10% of the time Expression Expression Mode: Verbal Expression: 4-Expresses basic 75 - 89% of the time/requires cueing 10 - 24% of the time. Needs helper to occlude trach/needs to repeat words. Social Interaction Social Interaction: 4-Interacts appropriately 75 - 89% of the time - Needs redirection for appropriate language or to initiate interaction. Problem Solving Problem Solving: 4-Solves basic 75 - 89% of the time/requires cueing 10 - 24% of the time Memory Memory: 2-Recognizes or recalls 25 - 49% of the time/requires cueing 51 - 75% of the time FIM - Eating Eating Activity: 5: Needs verbal cues/supervision  Pain Pain Assessment Pain Assessment: No/denies pain  Therapy/Group: Individual Therapy   Celia B. [place], [facility], [place]  Shonna Chock 02/12/2015, 3:09 PM

## 2015-02-12 NOTE — Progress Notes (Signed)
Occupational Therapy Session Note  Patient Details  Name: Timothy Lambert MRN: [id number] Date of Birth: 06-Jun-1956  Today's Date: 02/12/2015 OT Individual Time: 1100-1130 OT Individual Time Calculation (min): 30 min    Short Term Goals: Week 2:  OT Short Term Goal 1 (Week 2): STG = LTGs due to remaining LOS  Skilled Therapeutic Interventions/Progress Updates:    1:! Pt in bed when arrived.  Pt came to EOB mod I with elevated HOB. PT donned shoes with setup and using a step stool for ease and increase independence. Pt able to ambulation with RW to gym with supervision. In gym, participated in dynamic standing balance activity with horseshoes. Pt stood inside of a hula hop focusing on maintaining his balance while reaching outside of BOS to obtain horseshoes without UE support. Pt able to obtain and throw ~15 without LOB with close supervision. Transitioned to stair climbing for balance training and endurance. Pt able to climb up and down 12 stairs with bilateral rails with supervision at a slow speed. Educated on fall recovery and practiced getting off the floor. Pt able to transition from sitting on a mat to supine on a mat on the floor with VC for sequence and close supervision. With instructional cues and using the end of mat (as simulated piece of furniture in his home), pt able to return to seated position on mat.  Left in gym for next therapy session.   Therapy Documentation Precautions:  Precautions Precautions: Fall, Other (comment) Precaution Comments: Zoll Lifevest Restrictions Weight Bearing Restrictions: No Pain: Pain Assessment Pain Assessment: No/denies pain  See FIM for current functional status  Therapy/Group: Individual Therapy  [name] [facility] 02/12/2015, 6:48 PM

## 2015-02-12 NOTE — Progress Notes (Signed)
Occupational Therapy Session Note  Patient Details  Name: Timothy Lambert MRN: [id number] Date of Birth: [birth year]  Today's Date: 02/12/2015 OT Individual Time: [id number] and 1130-1200 OT Individual Time Calculation (min): 55 min and 30 min   Short Term Goals: Week 2:  OT Short Term Goal 1 (Week 2): STG = LTGs due to remaining LOS  Skilled Therapeutic Interventions/Progress Updates:    1) Engaged in hands on family education with pt and pt's wife with focus on functional mobility and transfers in home environment.  Discussed shower setup and recommendation for showering every few days for energy conservation and safety.  Discussed recommendation for tub transfer bench to increase safety and for energy conservation to sit for majority of bathing.  In ADL apartment engaged in transfer training to tub/shower with use of tub transfer bench with pt and wife return demonstrate understanding.  Also completed toilet transfers with close supervision with use of RW and without.  Pt's wife reports on step down from bedroom to bathroom, so in therapy gym engaged in one step up and down with and without RW.  Recommend HHA if not using RW and close supervision with RW as still unsure if pt would benefit from RW or not (as pt tends to be more unsafe with RW).  Ambulated back to room with RW with improved safety awareness and negotiation of thresholds and obstacles with supervision overall.  2)  Engaged in therapeutic activity with focus on activity tolerance and standing balance.  Completed ball toss activity in standing with focus on balance reactions and reaction time with quick changes and reaching outside BOS to Rt and Lt, pt with no LOB.  Engaged in table top activity with pt with no LOB with one UE support while completing task with other, progressed to no UE support, then further challenged to standing on dynamic surface with pt requiring close supervision but no LOB on compliant surface.  Returned to room  and pt left semi-reclined in bed with wife present.  Therapy Documentation Precautions:  Precautions Precautions: Fall, Other (comment) Precaution Comments: Zoll Lifevest Restrictions Weight Bearing Restrictions: No General:   Vital Signs: Therapy Vitals Temp: 98.1 F (36.7 C) Temp Source: Oral Pulse Rate: 87 Resp: 18 BP: 124/75 mmHg Patient Position (if appropriate): Sitting Oxygen Therapy SpO2: 100 % O2 Device: Not Delivered Pain: Pain Assessment Pain Assessment: No/denies pain  See FIM for current functional status  Therapy/Group: Individual Therapy  Simonne Come 02/12/2015, 3:49 PM

## 2015-02-12 NOTE — Progress Notes (Signed)
Physical Therapy Session Note  Patient Details  Name: Timothy Lambert MRN: [id number] Date of Birth: 08-10-1956  Today's Date: 02/12/2015 PT Individual Time: 1610-1636 PT Individual Time Calculation (min): 26 min   Short Term Goals: Week 2:  PT Short Term Goal 1 (Week 2): = LTG for D/C 02/14/15  Skilled Therapeutic Interventions/Progress Updates:   Pt received in bed asleep; pt easily awakened and agreeable to therapy.  Pt performed supine > sit mod I and donned shoes EOB with supervision and extra time.  Performed gait x 90' to gym without AD but with mod A secondary to continued R scissoring and LOB laterally with max-total A to self correct.  Performed gait assessment with use of quad cane on R to allow pt increased BOS and increased proprioceptive input for balance.  Pt performed gait training with quad cane >150' with changes in direction, weaving around obstacles, stepping over low obstacles, up/down one step curb, head turns vertical and horizontal and changes in gait speed all with min A and min verbal cues for safe sequencing; pt continued to demonstrate intermittent RLE scissoring and LOB to R but pt able to self correct with min A and quad cane.  Will perform gait tomorrow with RW and quad cane with wife present to assess wife's comfort with hands on assistance during gait.  Pt returned to room and set up EOB with NT present to assess CBG.    Therapy Documentation Precautions:  Precautions Precautions: Fall, Other (comment) Precaution Comments: Zoll Lifevest Restrictions Weight Bearing Restrictions: No Vital Signs: Therapy Vitals Temp: 98.1 F (36.7 C) Temp Source: Oral Pulse Rate: 87 Resp: 18 BP: 124/75 mmHg Patient Position (if appropriate): Sitting Oxygen Therapy SpO2: 100 % O2 Device: Not Delivered Pain: Pain Assessment Pain Assessment: No/denies pain Locomotion : Ambulation Ambulation/Gait Assistance: 4: Min assist   See FIM for current functional  status  Therapy/Group: Individual Therapy  Raylene Everts [facility] 02/12/2015, 5:04 PM

## 2015-02-12 NOTE — Progress Notes (Signed)
59 y.o. right handed male with history of hypertension, alcohol abuse, diabetes mellitus and coronary artery disease maintained on aspirin. Patient lives with his wife and independent up until a few weeks ago. Presented to [facility] 01/23/2015 with seizures. Extremely agitated requiring intubation for airway protection. Alcohol level and drug screen negative. CT of the head negative for acute changes. EKG with precordial ST changes concerning for ischemia/infarct. [facility]red to [facility] care. After arriving to [facility] repeat EKG with worsened ST elevations and T-wave inversions troponin greater than 65. Also noted blood pressure in the high 200s over 100s and glucose in the 400s. Underwent emergent cardiac catheterization findings of 99% LAD stenosis with stenting. He was also fitted with a life vest. Echocardiogram with ejection fraction [id number] grade 1 diastolic dysfunction. MRI 01/24/2015 showing multiple small foci of acute ischemia spanning multiple vascular territories consistent with embolic event. EEG was no seizure activity noted. Generalized low voltage slowing indicating mild to moderate cerebral disturbance encephalopathy. Subjective/Complaints:  No problems overnite Vocal vol is stronger today Wife here for training , no questions  ROS:   No SOB, No CP, no abd pain  Objective: Vital Signs: Blood pressure 115/57, pulse 88, temperature 99 F (37.2 C), temperature source Oral, resp. rate 18, weight 64.4 kg (141 lb 15.6 oz), SpO2 99 %. No results found. No results for input(s): WBC, HGB, HCT, PLT in the last 72 hours.  Recent Labs  02/11/15 0640  NA 137  K 4.7  CL 108  GLUCOSE 175*  BUN 22*  CREATININE 1.24  CALCIUM 8.3*   CBG (last 3)   Recent Labs  02/11/15 1749 02/11/15 2227 02/12/15 0644  GLUCAP 145* 238* 162*    Wt Readings from Last 3 Encounters:  02/02/15 64.4 kg (141 lb 15.6 oz)  02/01/15 64.2 kg (141 lb 8.6 oz)    Physical Exam:   HENT:  Head: Normocephalic.  Eyes:  Pupil reactive to light   Neck: Normal range of motion. Neck supple. No thyromegaly present.  Dysphonic voice persistent Cardiovascular: Normal rate and regular rhythm.  Respiratory: Effort normal and breath sounds normal. No respiratory distress.  GI: Soft. Bowel sounds are normal. He exhibits no distension.  Neurological:  Patient is alert, less restless and but still impulsive.  Limited awareness of his deficits. Followed simple commands. Moves all 4's with at least 4/5 strength proximal to distal in upper/lower limbs.   Skin: Skin is warm and dry.  Psychiatric:  Pleasant and cooperative in general   Assessment/Plan: 1. 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 Physiatrist is providing close team supervision and 24 hour management of active medical problems listed below. Physiatrist and rehab team continue to assess barriers to discharge/monitor patient progress toward functional and medical goals. Will have life vest for 3 months, ICD if repeat ECHO does not show improvement FIM: FIM - Bathing Bathing Steps Patient Completed: Chest, Right Arm, Left Arm, Abdomen, Buttocks, Right upper leg, Left upper leg, Front perineal area, Right lower leg (including foot), Left lower leg (including foot) Bathing: 5: Supervision: Safety issues/verbal cues  FIM - Upper Body Dressing/Undressing Upper body dressing/undressing steps patient completed: Thread/unthread right sleeve of pullover shirt/dresss, Thread/unthread left sleeve of pullover shirt/dress, Put head through opening of pull over shirt/dress, Pull shirt over trunk Upper body dressing/undressing: 5: Set-up assist to: Obtain clothing/put away FIM - Lower Body Dressing/Undressing  Lower body dressing/undressing steps patient completed: Thread/unthread right pants leg,  Thread/unthread left pants leg, Pull pants up/down, Don/Doff right sock, Don/Doff left sock, Don/Doff right shoe, Don/Doff left shoe, Fasten/unfasten right shoe, Fasten/unfasten left shoe, Thread/unthread right underwear leg, Thread/unthread left underwear leg, Pull underwear up/down Lower body dressing/undressing: 5: Set-up assist to: Don/Doff TED stocking  FIM - Toileting Toileting steps completed by patient: Adjust clothing prior to toileting, Performs perineal hygiene, Adjust clothing after toileting Toileting Assistive Devices: Grab bar or rail for support Toileting: 6: More than reasonable amount of time  FIM - Radio producer Devices: Grab bars, Insurance account manager Transfers: 4-To toilet/BSC: Min A (steadying Pt. > 75%), 4-From toilet/BSC: Min A (steadying Pt. > 75%)  FIM - Bed/Chair Transfer Bed/Chair Transfer Assistive Devices: Arm rests Bed/Chair Transfer: 5: Bed > Chair or W/C: Supervision (verbal cues/safety issues), 5: Chair or W/C > Bed: Supervision (verbal cues/safety issues)  FIM - Locomotion: Wheelchair Distance: 150 Locomotion: Wheelchair: 0: Activity did not occur FIM - Locomotion: Ambulation Locomotion: Ambulation Assistive Devices: Administrator Ambulation/Gait Assistance: 4: Min assist, 3: Mod assist Locomotion: Ambulation: 3: Travels 150 ft or more with moderate assistance (Pt: 50 - 74%)  Comprehension Comprehension Mode: Auditory Comprehension: 5-Understands basic 90% of the time/requires cueing < 10% of the time  Expression Expression Mode: Verbal Expression: 4-Expresses basic 75 - 89% of the time/requires cueing 10 - 24% of the time. Needs helper to occlude trach/needs to repeat words.  Social Interaction Social Interaction: 4-Interacts appropriately 75 - 89% of the time - Needs redirection for appropriate language or to initiate interaction.  Problem Solving Problem Solving: 4-Solves basic 75 - 89% of the time/requires cueing 10  - 24% of the time  Memory Memory: 2-Recognizes or recalls 25 - 49% of the time/requires cueing 51 - 75% of the time Medical Problem List and Plan: 1. Functional deficits secondary to embolic CVA/ s/pSTEMI status post stenting. Fitted with LifeVest  -hold on showering , vest can come off for 31min, per OT bathing time is ~80min, pt is slow 2. DVT Prophylaxis/Anticoagulation: Aspirin/Brilinta as well as SCDs. Monitor for any signs of DVT 3. Pain Management: Tylenol as needed 4. Seizure disorder. Keppra 500 mg twice a day. EEG negative. 5. Neuropsych: This patient is capable of making decisions on his own behalf.  -bed alarm, ?mittens for safety---close observation/safety plan 6. Skin/Wound Care: Routine skin checks 7. Fluids/Electrolytes/Nutrition: Routine I&O's with follow-up chemistries  -inadequate intake presently 8. Dysphagia. Dysphagia 3 nectar liquids. Cough has improved ? Vocal cord edema or paralysis,? Emergent intubation will need ENT f/u 9. Hypertension. Coreg 25 mg twice a day, lisinopril 10 mg daily. Monitor with increased mobility 10. Diabetes mellitus and peripheral neuropathy. Hemoglobin A1c 9.3.  Check blood sugars before meals and at bedtime.  -sugars fairly controlled at present.   -increase lantus to 30u qam  (on 14u at home) 11. Alcohol abuse. Monitor for any signs of withdrawal provide counseling 12. Hyperlipidemia. Lipitor   LOS (Days) 11 A FACE TO FACE EVALUATION WAS PERFORMED  Charlett Blake 02/12/2015 7:55 AM

## 2015-02-12 NOTE — Progress Notes (Signed)
Patient Name: Timothy Lambert Date of Encounter: 02/12/2015  Principal Problem:   Embolic cerebral infarction Active Problems:   STEMI (ST elevation myocardial infarction)   Altered mental status   Dysphagia   Diabetes type 2, uncontrolled   Hyperkalemia   CHF (congestive heart failure)  SUBJECTIVE  Denies chest pain, sob, palpitation or PND. Feels better. Participated well in PT.   CURRENT MEDS . aspirin  81 mg Oral Daily  . atorvastatin  80 mg Oral q1800  . carvedilol  25 mg Oral BID WC  . famotidine  20 mg Oral BID  . folic acid  1 mg Oral Daily  . guaifenesin  100 mg Oral BID  . insulin aspart  0-15 Units Subcutaneous TID WC  . insulin glargine  24 Units Subcutaneous Daily  . levETIRAcetam  500 mg Oral Q12H  . lisinopril  10 mg Oral Daily  . sulfamethoxazole-trimethoprim  1 tablet Oral Q12H  . thiamine  100 mg Oral Daily  . ticagrelor  90 mg Oral BID    OBJECTIVE  Filed Vitals:   02/11/15 0500 02/11/15 1413 02/12/15 0529 02/12/15 0818  BP: 143/73 141/73 115/57 112/70  Pulse: 90 84 88 77  Temp: 99 F (37.2 C) 98.9 F (37.2 C) 99 F (37.2 C) 98.5 F (36.9 C)  TempSrc: Oral Oral Oral   Resp: 20 20 18    Weight:      SpO2: 100% 100% 99%     Intake/Output Summary (Last 24 hours) at 02/12/15 1059 Last data filed at 02/12/15 0900  Gross per 24 hour  Intake    600 ml  Output      0 ml  Net    600 ml   Filed Weights   02/02/15 0445  Weight: 141 lb 15.6 oz (64.4 kg)    PHYSICAL EXAM  General: Pleasant, thin male in NAD. Soft and limited speaking.  Neuro: Alert and oriented X 3. Moves all extremities spontaneously. Psych: Normal affect. HEENT:  Normal  Neck: Supple without bruits or JVD. Lungs:  Resp regular and unlabored, CTA. Heart: RRR no s3, s4, or murmurs. Abdomen: Soft, non-tender, non-distended, BS + x 4.  Extremities: No clubbing, cyanosis or edema. DP/PT/Radials 2+ and equal bilaterally.   Basic Metabolic Panel  Recent Labs   02/11/15 0640  NA 137  K 4.7  CL 108  CO2 23  GLUCOSE 175*  BUN 22*  CREATININE 1.24  CALCIUM 8.3*     ASSESSMENT AND PLAN   1. Anterior STEMI - Cath 5/4 99% mid/distal LAD treated with DES, 50% L main dx, 90% D1 stenosis, 75% diffuse ramus, 50% OM1, 70% mid RCA stenosis, 40% RPDA - Life vest in place, Repeat echo in 3 month, if low need ICD.  -  continue ASA, brilinta, statin, coreg and lisinopril.  2. Acute systolic and diastolic HF - Patient developed renal insuff with lasix now discontinued.  SCr 1.24 has improved to WNL 02/11/15.Discontined  IVFs. Appears euvolemic. Closely monitor for CHF. - Echo 01/24/2015 EF 30-35%, PA peak pressure 66mg , significant wall motion abnormalities.  3. Acute CVA, likely embolic - no LV thrombus noted on 2D echo, continue ASA, Brilinta. Not good anticoagulation candidate given his EtOH abuse  4. Hypertensive emergency. -BP now controlled and stable   6. ETOH abuse with h/o seizure  7. Proxysmal atrial flutter: - CHA2-DS2-VAsc score 4 (HTN, HF, stroke) - Brief AFlutter 01/30/15 - not candidate for systemic anticoagulation   LOS: 11 days   Signed,  Bhagat,Bhavinkumar PA-C Pager [phone]  As above; no chest pain or dyspnea; no volume excess off of lasix; continue present cardiac meds; plan as outlined previously; we will follow from a distance. Kirk Ruths

## 2015-02-13 ENCOUNTER — Inpatient Hospital Stay (HOSPITAL_COMMUNITY): Payer: Self-pay | Admitting: Speech Pathology

## 2015-02-13 ENCOUNTER — Inpatient Hospital Stay (HOSPITAL_COMMUNITY): Payer: Medicaid Other | Admitting: Physical Therapy

## 2015-02-13 ENCOUNTER — Inpatient Hospital Stay (HOSPITAL_COMMUNITY): Payer: Self-pay | Admitting: Occupational Therapy

## 2015-02-13 DIAGNOSIS — R4020 Unspecified coma: Secondary | ICD-10-CM

## 2015-02-13 LAB — GLUCOSE, CAPILLARY
GLUCOSE-CAPILLARY: 111 mg/dL — AB (ref 65–99)
Glucose-Capillary: 127 mg/dL — ABNORMAL HIGH (ref 65–99)
Glucose-Capillary: 203 mg/dL — ABNORMAL HIGH (ref 65–99)
Glucose-Capillary: 124 mg/dL — ABNORMAL HIGH (ref 65–99)
Glucose-Capillary: 131 mg/dL — ABNORMAL HIGH (ref 65–99)

## 2015-02-13 NOTE — Progress Notes (Signed)
Speech Language Pathology Daily Session Note  Patient Details  Name: Timothy Lambert MRN: [id number] Date of Birth: 1956-02-26  Today's Date: 02/13/2015 SLP Individual Time: [id number] SLP Individual Time Calculation (min): 60 min  Short Term Goals: Week 2: SLP Short Term Goal 1 (Week 2): Patient will consume current diet with minimal overt s/s of aspiration with Min A verbal cues for use of swallow strategies.  SLP Short Term Goal 1 - Progress (Week 2): Progressing toward goal SLP Short Term Goal 2 (Week 2): Patient will consume trials of thin liquids without overt s/s of aspiration to assess readiness for repeat objective swallow study with Mod A verbal cues.  SLP Short Term Goal 2 - Progress (Week 2): Progressing toward goal SLP Short Term Goal 3 (Week 2): Patient will utilize an increased vocal intensity at the word level with Min A verbal cues to increase intelligibility to 75%.  SLP Short Term Goal 3 - Progress (Week 2): Progressing toward goal SLP Short Term Goal 4 (Week 2): Patient will utilize call bell to request assistance with Mod A multimodal cues in 50% of observable opportunities.  SLP Short Term Goal 4 - Progress (Week 2): Progressing toward goal SLP Short Term Goal 5 (Week 2): Patient will identify 2 physical and 2 cognitive deficits with Mod A multimodal cues.  SLP Short Term Goal 5 - Progress (Week 2): Progressing toward goal SLP Short Term Goal 6 (Week 2): Patient will utilize external memory aids to recall new, daily information with Mod A multimodal cues.  SLP Short Term Goal 6 - Progress (Week 2): Progressing toward goal  Skilled Therapeutic Interventions:  Pt was seen for skilled ST targeting goals for dysphagia and cognition.  Upon arrival, pt was seated upright in the recliner with quick release belt donned and feet elevated, asleep but easily awakened to voice and light.  Pt was agreeable to participate in ST.  SLP facilitated the session with trials of regular water  via cup sips to continue working towards liquids advancement.  Pt completed oral care with supervision cues prior to trials.   Pt consumed regular water with no overt s/s of aspiration.  Recommend that pt remain on nectar thick liquids until he can be scheduled for an MBS, which will likely need to be done on an outpatient basis at this point to allow time for observation of toleration of upgraded consistencies.  Pt made aware and in agreement.  SLP administered the [facility] standardized assessment to track progress from initial admission.  Pt scored 16 out of 30 on the abovementioned assessment, indicating moderate cognitive deficits.  Errors were most notable for visuospatial and executive function skills, delayed recall of information, naming fluency, and higher level attention for serial subtraction.  Pt's wife has not been present for family education for thickening liquids yet and pt is scheduled for discharge tomorrow.  Recommend that pt be seen tomorrow prior to discharge in order to complete family education.  CSW made aware.  Continue per current plan of care.    FIM:  Comprehension Comprehension Mode: Auditory Comprehension: 5-Follows basic conversation/direction: With extra time/assistive device Expression Expression Mode: Verbal Expression: 5-Expresses basic 90% of the time/requires cueing < 10% of the time. Social Interaction Social Interaction: 4-Interacts appropriately 75 - 89% of the time - Needs redirection for appropriate language or to initiate interaction. Problem Solving Problem Solving: 4-Solves basic 75 - 89% of the time/requires cueing 10 - 24% of the time Memory Memory: 3-Recognizes or recalls 50 -  74% of the time/requires cueing 25 - 49% of the time  Pain Pain Assessment Pain Assessment: No/denies pain  Therapy/Group: Individual Therapy  Kailoni Vahle, Selinda Orion 02/13/2015, 3:41 PM

## 2015-02-13 NOTE — Discharge Summary (Signed)
Timothy Lambert, HERTENSTEIN NO.:  [id number]  MEDICAL RECORD NO.:  [id number]  LOCATION:  [id number]                        FACILITY:  [place]  PHYSICIAN:  Charlett Blake, M.D.DATE OF BIRTH:  [birth year]  DATE OF ADMISSION:  02/01/2015 DATE OF DISCHARGE:  02/14/2015                              DISCHARGE SUMMARY   DISCHARGE DIAGNOSES: 1. Functional deficits secondary to embolic cerebrovascular accident,     status post STEMI with stenting. 2. SCDs for DVT prophylaxis. 3. Pain management. 4. Seizure disorder. 5. Dysphagia. 6. Hypertension. 7. Diabetes mellitus, peripheral neuropathy. 8. Alcohol abuse. 9. Hyperlipidemia.  HISTORY OF PRESENT ILLNESS:  This is a 59 year old right-handed male with history of hypertension, alcohol abuse, diabetes mellitus and coronary artery disease, maintained on aspirin therapy.  He lives with his wife and independent.  He presented to [facility] on [facility] 23, 2015, with seizures.  Extremely agitated required intubation.  Alcohol level and drug screen negative.  CT of the head negative.  EKG with precordial ST changes concerning for ischemia and infarct.  Transferred to [facility] for ongoing care.  Repeat EKG showed worsened ST elevation, T-wave inversions, troponin greater than 65.  Also, noted blood pressures in the high 200/100, and glucose in the 400.  Underwent emergent cardiac catheterization, findings of 99% LAD stenosis with stenting.  He was fitted with a LifeVest.  Echocardiogram with ejection fraction 35% and grade 1 diastolic dysfunction.  MRI of the brain showed multiple small foci of acute ischemia, spanning multiple vascular territories consistent with embolic event.  EEG negative.  Generalized slowing indicating a moderate cerebral disturbance encephalopathy. Carotid Dopplers with no ICA stenosis.  Neurology followup, maintained on aspirin as well as the addition of Brilinta.  Nasogastric tube placed for  nutritional support.  He passed a swallowing study on a mechanical soft nectar thick liquid diet.  Physical and occupational therapy ongoing.  The patient was admitted for comprehensive rehab program.  PAST MEDICAL HISTORY:  See discharge diagnoses.  SOCIAL HISTORY:  Lives with spouse.  Independent prior to admission. Working at [employer].  Functional status upon admission to rehab services with moderate assist stand pivot transfers, needing assistance sit to stand, min to mod assist activities of daily living.  PHYSICAL EXAMINATION:  VITAL SIGNS:  Blood pressure 165/87, pulse 88, temperature 98, respirations 18. GENERAL:  This was an alert male.  He was a bit impulsive.  He was able to provide his name and age.  Limited awareness of deficits. LUNGS:  Clear to auscultation. CARDIAC:  Regular rate and rhythm. ABDOMEN:  Soft, nontender.  Good bowel sounds.  REHABILITATION HOSPITAL COURSE:  The patient was admitted to inpatient rehab services with therapies initiated on a 3-hour daily basis consisting of physical therapy, occupational therapy, speech therapy, and rehabilitation nursing.  The following issues were addressed during the patient's rehabilitation stay.  Pertaining to Mr. Stenglein's embolic CVA as well as STEMI with stenting remained stable, maintained on aspirin as well as Brilinta.  He would follow up Cardiology Services. He continued with LifeVest.  Sequential compression devices for DVT prophylaxis.  He was continued on Keppra for seizure prophylaxis.  EEG negative for seizure.  Currently,  maintained on a mechanical soft nectar thick liquid diet.  No signs of aspiration.  Blood pressure is well controlled with no orthostatic changes.  He did have a history of diabetes mellitus, peripheral neuropathy.  Hemoglobin A1c of 9.3.  He continued on insulin therapy with full diabetic teaching.  The patient with noted history of alcohol abuse, received full counseling with  the patient as well as with wife in regard to cessation of any illicit alcohol or drug products.  He was also treated for an E. coli urinary tract infection during his rehab stay with Bactrim.  He denied any dysuria.  He remained afebrile.  The patient received weekly collaborative interdisciplinary team conferences to discuss estimated length of stay, family teaching, and any barriers to discharge.  He is ambulating 90 feet to the gym without assistive device, but with moderate assist secondary to continued right scissoring gait.  Performed a gait training with a quad cane as well as with changing directions weaving around obstacles.  Up and down kerbs.  Activities of daily living and homemaking.  He was able to gather his belongings for activities of daily living, although needing supervision for some safety issues.  In the [place] apartment, engaged in transfer training to tub shower with the use of tub transfer bench, and ongoing teaching with his wife that was completed and plan to discharge to home.  DISCHARGE MEDICATIONS: 1. Aspirin 81 mg p.o. daily. 2. Lipitor 80 mg p.o. daily. 3. Coreg 25 mg p.o. b.i.d. 4. Colace 100 mg twice daily as needed. 5. Pepcid 20 mg twice daily. 6. Folic acid 1 mg p.o. daily. 7. Lantus insulin 24 units daily. 8. Keppra 500 mg p.o. every 12 hours. 9. Lisinopril 10 mg p.o. daily. 10.Bactrim 1 tablet every 12 hours to complete a 7-day course. 11.Brilinta 90 mg p.o. b.i.d.  DIET:  Mechanical soft nectar liquids.  SPECIAL INSTRUCTIONS:  Continue LifeVest.  No alcohol.  The patient to follow up with Dr. Alysia Penna at the outpatient rehab center on March 19, 2015.  Follow up with Murray Hodgkins, Cardiology Services. Call for appointment.     Lauraine Rinne, P.A.   ______________________________ Charlett Blake, M.D.    DA/MEDQ  D:  02/13/2015  T:  02/13/2015  Job:  [id number]  cc:   Murray Hodgkins, ANP

## 2015-02-13 NOTE — Discharge Instructions (Signed)
Inpatient Rehab Discharge Instructions  Timothy Lambert Discharge date and time: No discharge date for patient encounter.   Activities/Precautions/ Functional Status: Activity: activity as tolerated Diet:  Wound Care: none needed Functional status:  ___ No restrictions     ___ Walk up steps independently ___ 24/7 supervision/assistance   ___ Walk up steps with assistance ___ Intermittent supervision/assistance  ___ Bathe/dress independently ___ Walk with walker     ___ Bathe/dress with assistance ___ Walk Independently    ___ Shower independently _x STROKE/TIA DISCHARGE INSTRUCTIONS SMOKING Cigarette smoking nearly doubles your risk of having a stroke & is the single most alterable risk factor  If you smoke or have smoked in the last 12 months, you are advised to quit smoking for your health.  Most of the excess cardiovascular risk related to smoking disappears within a year of stopping.  Ask you doctor about anti-smoking medications  [place] Quit Line: 1-800-QUIT NOW  Free Smoking Cessation Classes [phone]  CHOLESTEROL Know your levels; limit fat & cholesterol in your diet  Lipid Panel     Component Value Date/Time   CHOL 143 01/26/2015 0349   TRIG 192* 01/29/2015 0430   HDL 40* 01/26/2015 0349   CHOLHDL 3.6 01/26/2015 0349   VLDL 33 01/26/2015 0349   LDLCALC 70 01/26/2015 0349      Many patients benefit from treatment even if their cholesterol is at goal.  Goal: Total Cholesterol (CHOL) less than 160  Goal:  Triglycerides (TRIG) less than 150  Goal:  HDL greater than 40  Goal:  LDL (LDLCALC) less than 100   BLOOD PRESSURE American Stroke Association blood pressure target is less that 120/80 mm/Hg  Your discharge blood pressure is:  BP: (!) 115/57 mmHg  Monitor your blood pressure  Limit your salt and alcohol intake  Many individuals will require more than one medication for high blood pressure  DIABETES (A1c is a blood sugar average for last 3 months) Goal  HGBA1c is under 7% (HBGA1c is blood sugar average for last 3 months)  Diabetes:    Lab Results  Component Value Date   HGBA1C 9.3* 01/26/2015     Your HGBA1c can be lowered with medications, healthy diet, and exercise.  Check your blood sugar as directed by your physician  Call your physician if you experience unexplained or low blood sugars.  PHYSICAL ACTIVITY/REHABILITATION Goal is 30 minutes at least 4 days per week  Activity: Increase activity slowly, Therapies: Physical Therapy: Home Health Return to work:   Activity decreases your risk of heart attack and stroke and makes your heart stronger.  It helps control your weight and blood pressure; helps you relax and can improve your mood.  Participate in a regular exercise program.  Talk with your doctor about the best form of exercise for you (dancing, walking, swimming, cycling).  DIET/WEIGHT Goal is to maintain a healthy weight  Your discharge diet is: DIET DYS 3 Room service appropriate?: Yes; Fluid consistency:: Nectar Thick  liquids Your height is:    Your current weight is: Weight: 64.4 kg (141 lb 15.6 oz) Your Body Mass Index (BMI) is:     Following the type of diet specifically designed for you will help prevent another stroke.  Your goal weight range is:    Your goal Body Mass Index (BMI) is 19-24.  Healthy food habits can help reduce 3 risk factors for stroke:  High cholesterol, hypertension, and excess weight.  RESOURCES Stroke/Support Group:  Call [phone]  STROKE EDUCATION PROVIDED/REVIEWED AND GIVEN TO PATIENT Stroke warning signs and symptoms How to activate emergency medical system (call 911). Medications prescribed at discharge. Need for follow-up after discharge. Personal risk factors for stroke. Pneumonia vaccine given:  Flu vaccine given:  My questions have been answered, the writing is legible, and I understand these instructions.  I will adhere to these goals & educational materials that have  been provided to me after my discharge from the hospital.    __ Walk with assistance    ___ Shower with assistance ___ No alcohol     ___ Return to work/school ________  Special Instructions:  Continue life vest as directed  No alcohol    COMMUNITY REFERRALS UPON DISCHARGE:    Home Health:   PT, OT, SP, RN, [place] Phone: [phone]    Date of last service:02/14/2015  Medical Equipment/Items [place] PENDING [facility] PCP FOLLOW UP  GENERAL COMMUNITY RESOURCES FOR PATIENT/FAMILY: Support Groups:[facility] SUPPORT GROUP   My questions have been answered and I understand these instructions. I will adhere to these goals and the provided educational materials after my discharge from the hospital.  Patient/Caregiver Signature _______________________________ Date __________  Clinician Signature _______________________________________ Date __________  Please bring this form and your medication list with you to all your follow-up doctor's appointments.

## 2015-02-13 NOTE — Progress Notes (Signed)
Social Work Patient ID: Timothy Lambert, male   DOB: 1956/06/03, 59 y.o.   MRN: [id number] Met with pt and wife to discuss team conference progression toward goals and discharge tomorrow.  Wife feels she is prepared for discharge, plans to do 30 more minutes of Speech therapy education tomorrow am. She feels comfortable with the Life Vest and feels no more education is needed regarding this.  She is on FMLA for 9 more weeks with pt. She is aware home health will be contacting her and setting up visits. Have ordered [facility] and tub bench to be delivered to room prior to discharge.  Wife to follow up with pt's disability and medicaid pending applications. See tomorrow for any last minute questions.

## 2015-02-13 NOTE — Discharge Summary (Signed)
Discharge summary job [id number]

## 2015-02-13 NOTE — Patient Care Conference (Signed)
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 CHF (congestive heart failure) 02/10/2015  . Hyperkalemia   . 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 Metabolic encephalopathy   . CVA (cerebral vascular accident)   . Dysphagia   . ST elevation (STEMI) myocardial infarction involving left anterior descending coronary artery   . Acute combined systolic and diastolic congestive heart failure   . Paroxysmal atrial flutter   . Hypokalemia   . Diabetes type 2, uncontrolled   . HLD (hyperlipidemia)   . Altered mental status   . Hyperglycemia   . Seizure   . Acute embolic stroke   . Acute respiratory failure with hypoxia 01/24/2015  . Acute renal failure syndrome   . Acute respiratory failure   . Hypertensive emergency 01/23/2015  . STEMI (ST elevation myocardial infarction) 01/23/2015  . Acute MI anterior wall first episode care   . Altered mental state     Expected Discharge Date: Expected Discharge Date: 02/14/15  Team Members Present: Physician leading conference: Dr. Alysia Penna Social Worker Present: Ovidio Kin, LCSW Nurse Present: Heather Roberts, RN PT Present: Jorge Mandril, Cecille Rubin, PT;Caroline Lacinda Axon, PT OT Present: Simonne Come, Dorothyann Gibbs, OT SLP Present: Windell Moulding, SLP PPS Coordinator present : Daiva Nakayama, RN, CRRN     Current Status/Progress Goal Weekly Team Focus  Medical   Vocal  volume improving,  able to shower within 10 minutes  Home discharge, cardiac stability  Discharge planning   Bowel/Bladder   continent of bowel and bladder. LBM 5/22  Min assist  remain continent of bowel and bladder   Swallow/Nutrition/ Hydration   Dys 3 textures, nectar thick liquids; full supervision for use of swallowing precautions   supevision   completion of education prior to discharge    ADL's   supervision with bathing and dressing, min-supervision with functional transfers with and without RW.  Pt with much improved attention, awareness, and participation in treatment session.  supervision overall  transfers, functional mobility, and family education   Mobility   Supervision with quad cane   Supervision overall  Family education/training and preparation for D/C; balance and gait   Communication   min assist-supervision for increased vocal intensity  supervision   completion of family education prior to discharge   Safety/Cognition/ Behavioral Observations  min-mod assist for basic tasks   min assist for basic   completion of family education prior to discharge.    Pain   no complaints of pain or discomfort  <2 on a 0-10 scale  assess pain q 4hr and medicate as needed   Skin   R foot old scabbed area, clean and dry  remain free from infection and breakdown while on rehab  assess skin q shift      *See Care Plan and progress notes for long and short-term goals.  Barriers to Discharge: Has to manage LifeVest in addition to meds and  other mobility issues    Possible Resolutions to Barriers:  Patient and family education, set up post discharge  appointments    Discharge Planning/Teaching Needs:  Wife has been here learning pt's care in prepartion for discharge tomorrow.  Aware pt will require 24 hr care at discharge      Team Discussion:  Pt reached goals of supervisioon-education completed with wife and she feels comfortable with his care. Feels comfortable with life vest  education.  Pt ready for DC tomorrow. 30 min of SP education tomorrow am  Revisions to Treatment Plan:  None   Continued Need for Acute Rehabilitation Level of Care: The patient requires daily medical management by a physician with specialized training in physical medicine and rehabilitation for the following conditions: Daily direction of a multidisciplinary physical rehabilitation program to ensure safe treatment while eliciting the highest outcome that is of practical value to the patient.: Yes Daily medical management of patient stability for increased activity during participation in an intensive rehabilitation regime.: Yes Daily analysis of laboratory values and/or radiology reports with any subsequent need for medication adjustment of medical intervention for : Neurological problems;Other  [name] 02/13/2015, 12:47 PM

## 2015-02-13 NOTE — Progress Notes (Signed)
Occupational Therapy Discharge Summary  Patient Details  Name: Timothy Lambert MRN: [id number] Date of Birth: 19-Sep-1956  Patient has met 11 of 11 long term goals due to improved activity tolerance, improved balance, ability to compensate for deficits, improved attention and improved awareness.  Patient to discharge at overall Supervision level.  Patient's care partner is independent to provide the necessary supervision assistance at discharge.    Reasons goals not met: N/A  Recommendation:  Patient will benefit from ongoing skilled OT services in home health setting to continue to advance functional skills in the area of BADL and Reduce care partner burden.  Equipment: tub transfer bench  Reasons for discharge: treatment goals met and discharge from hospital  Patient/family agrees with progress made and goals achieved: Yes  OT Discharge Precautions/Restrictions  Precautions Precautions: Fall;Other (comment) Precaution Comments: [employer] Lifevest General   Vital Signs Therapy Vitals Temp: 97.8 F (36.6 C) Temp Source: Oral Pulse Rate: 78 Resp: 16 BP: 103/63 mmHg Patient Position (if appropriate): Sitting Oxygen Therapy SpO2: 100 % O2 Device: Not Delivered Pain Pain Assessment Pain Assessment: No/denies pain ADL  See FIM Vision/Perception  Vision- History Baseline Vision/History: Wears glasses Wears Glasses: Reading only Vision- Assessment Vision Assessment?: No apparent visual deficits Eye Alignment: Within Functional Limits  Cognition Arousal/Alertness: Awake/alert Orientation Level: Oriented to person;Oriented to place;Oriented to time;Oriented to situation Awareness: Impaired Problem Solving: Impaired Behaviors: Impulsive Safety/Judgment: Impaired Comments: Pt's awareness and problem solving continue to be impaired, but have greatly improved. Sensation Sensation Light Touch: Appears Intact (in BUE) Stereognosis: Not tested Hot/Cold: Appears  Intact Proprioception: Appears Intact Coordination Fine Motor Movements are Fluid and Coordinated: Yes 9 Hole Peg Test: Rt: 51 seconds, Lt: 54 seconds Extremity/Trunk Assessment RUE Assessment RUE Assessment: Within Functional Limits LUE Assessment LUE Assessment: Within Functional Limits  See FIM for current functional status  Pravin Perezperez, [facility] 02/13/2015, 3:56 PM

## 2015-02-13 NOTE — Progress Notes (Signed)
Physical Therapy Discharge Summary  Patient Details  Name: Timothy Lambert MRN: [id number] Date of Birth: [birth year]  Today's Date: 02/13/2015 PT Individual Time: 1520-1620 PT Individual Time Calculation (min): 60 min    Patient has met 9 of 9 long term goals due to improved activity tolerance, improved balance, improved postural control, increased strength, ability to compensate for deficits, functional use of  right upper extremity, right lower extremity, left upper extremity and left lower extremity, improved attention, improved awareness and improved coordination.  Patient to discharge at an ambulatory level Supervision with quad cane.   Patient's care partner is independent to provide the necessary cognitive and supervision assistance at discharge.  Reasons goals not met: All goals met  Recommendation:  Patient will benefit from ongoing skilled PT services in home health setting and progress to cardiac rehab to continue to advance safe functional mobility, address ongoing impairments in activity tolerance/endurance, impaired strength, motor control, timing/sequencing, coordination, postural control, balance, gait, and minimize fall risk.  Equipment: wide based quad cane; pt has w/c at home  Reasons for discharge: treatment goals met and discharge from hospital  Patient/family agrees with progress made and goals achieved: Yes  PT Discharge Pt not feeling well this pm with one episode of orthostasis; BP assessed and pt without symptoms of orthostasis.  Pt agreeable to family education with wife.  Pt and wife participated in family education and training with focus on gait, one step negotiation, car and functional transfers with quad cane with wife providing close supervision-min A.  Pt and wife advised that if pt is displaying symptoms of orthostasis/lightheadedness or fatigue, pt to use manual w/c for mobility for safety.  Pt agreeable.  BERG reassessed.  Pt demonstrated for wife floor >  furniture transfer with supervision and wife educated on indications for calling EMS.  Pt and wife with no other concerns or questions.    Precautions/Restrictions Precautions Precautions: Fall;Other (comment) Precaution Comments: Zoll Lifevest Vital Signs Therapy Vitals Temp: 97.8 F (36.6 C) Temp Source: Oral Pulse Rate: 78 Resp: 16 BP: 103/63 mmHg Patient Position (if appropriate): Orthostatic Vitals Oxygen Therapy SpO2: 100 % O2 Device: Not Delivered Pain Pain Assessment Pain Assessment: No/denies pain Vision/Perception  Vision - Assessment Eye Alignment: Within Functional Limits  Cognition Arousal/Alertness: Awake/alert Orientation Level: Oriented to person;Oriented to place;Oriented to time;Oriented to situation Awareness: Impaired Problem Solving: Impaired Behaviors: Impulsive Safety/Judgment: Impaired Comments: Pt's awareness and problem solving continue to be impaired, but have greatly improved. Sensation Sensation Light Touch: Appears Intact Stereognosis: Not tested Hot/Cold: Appears Intact Proprioception: Appears Intact Coordination Fine Motor Movements are Fluid and Coordinated: Yes 9 Hole Peg Test: Rt: 51 seconds, Lt: 54 seconds Motor  Motor Motor: Abnormal postural alignment and control  Mobility Bed Mobility Supine to Sit: 6: Modified independent (Device/Increase time) Sit to Supine: 6: Modified independent (Device/Increase time) Transfers Stand Pivot Transfers: 5: Supervision Stand Pivot Transfer Details (indicate cue type and reason): Pt performed multiple transfers bed <> w/c, w/c <> car with stand pivot with UE support on quad cane with supervision and verbal cues for pt to stand for a few seconds prior to initiating transfer to avoid LOB to R and prevent orthostasis; pt able to transfer into and out of car with supervision.  Without UE support pt requires min A for balance and prevent LOB to R Locomotion  Ambulation Ambulation/Gait Assistance:  5: Supervision Ambulation Distance (Feet): 150 Feet Assistive device: Large base quad cane Gait Gait: Yes Gait Pattern: Step-through pattern;Decreased weight shift  to left;Scissoring;Trunk flexed Stairs / Additional Locomotion Stairs: Yes Stairs Assistance: 5: Supervision Stairs Assistance Details (indicate cue type and reason): Performs one step negotiation x 4-6 reps for home entry/exit; pt able to perform with supervision with verbal cues for safe sequence but wife advised to maintain min A for home safety Stair Management Technique: Step to pattern;Forwards;With cane Number of Stairs: 4 Height of Stairs: 6 Product manager Assistance: 5: Careers information officer: Both upper extremities Wheelchair Parts Management: Supervision/cueing Distance: 150  Trunk/Postural Assessment  Cervical Assessment Cervical Assessment: Within Functional Limits Thoracic Assessment Thoracic Assessment: Within Functional Limits Lumbar Assessment Lumbar Assessment: Within Functional Limits Postural Control Postural Control: Deficits on evaluation (impaired balance reactions in standing; increased use of step strategy)  Balance Standardized Balance Assessment Standardized Balance Assessment: Berg Balance Test Berg Balance Test Sit to Stand: Able to stand without using hands and stabilize independently Standing Unsupported: Able to stand 2 minutes with supervision Sitting with Back Unsupported but Feet Supported on Floor or Stool: Able to sit safely and securely 2 minutes Stand to Sit: Controls descent by using hands Transfers: Able to transfer safely, definite need of hands Standing Unsupported with Eyes Closed: Able to stand 10 seconds with supervision Standing Ubsupported with Feet Together: Able to place feet together independently but unable to hold for 30 seconds From Standing, Reach Forward with Outstretched Arm: Can reach confidently >25 cm (10") From Standing Position,  Pick up Object from Floor: Able to pick up shoe safely and easily From Standing Position, Turn to Look Behind Over each Shoulder: Turn sideways only but maintains balance Turn 360 Degrees: Able to turn 360 degrees safely but slowly Standing Unsupported, Alternately Place Feet on Step/Stool: Able to complete >2 steps/needs minimal assist Standing Unsupported, One Foot in Front: Able to take small step independently and hold 30 seconds Standing on One Leg: Unable to try or needs assist to prevent fall Total Score: 37  Patient demonstrates increased fall risk as noted by score of 37/56 on Berg Balance Scale; improved from 30/56 on admission; pt has decreased falls risk by 20%.  (<36= high risk for falls, close to 100%; 37-45 significant >80%; 46-51 moderate >50%; 52-55 lower >25%)  Extremity Assessment  RUE Assessment RUE Assessment: Within Functional Limits LUE Assessment LUE Assessment: Within Functional Limits RLE Assessment RLE Assessment: Within Functional Limits LLE Assessment LLE Assessment: Within Functional Limits  See FIM for current functional status  Raylene Everts [facility] 02/13/2015, 5:38 PM

## 2015-02-13 NOTE — Progress Notes (Signed)
59 y.o. right handed male with history of hypertension, alcohol abuse, diabetes mellitus and coronary artery disease maintained on aspirin. Patient lives with his wife and independent up until a few weeks ago. Presented to [facility] 01/23/2015 with seizures. Extremely agitated requiring intubation for airway protection. Alcohol level and drug screen negative. CT of the head negative for acute changes. EKG with precordial ST changes concerning for ischemia/infarct. Transferred to [facility] for ongoing care. After arriving to [facility] repeat EKG with worsened ST elevations and T-wave inversions troponin greater than 65. Also noted blood pressure in the high 200s over 100s and glucose in the 400s. Underwent emergent cardiac catheterization findings of 99% LAD stenosis with stenting. He was also fitted with a life vest. Echocardiogram with ejection fraction 06% grade 1 diastolic dysfunction. MRI 01/24/2015 showing multiple small foci of acute ischemia spanning multiple vascular territories consistent with embolic event. EEG was no seizure activity noted. Generalized low voltage slowing indicating mild to moderate cerebral disturbance encephalopathy. Subjective/Complaints:  No problems overnite Vocal vol is stronger  Showering with OT  ROS:   No SOB, No CP, no abd pain  Objective: Vital Signs: Blood pressure 117/60, pulse 78, temperature 98 F (36.7 C), temperature source Oral, resp. rate 18, weight 64.4 kg (141 lb 15.6 oz), SpO2 98 %. No results found. No results for input(s): WBC, HGB, HCT, PLT in the last 72 hours.  Recent Labs  02/11/15 0640  NA 137  K 4.7  CL 108  GLUCOSE 175*  BUN 22*  CREATININE 1.24  CALCIUM 8.3*   CBG (last 3)   Recent Labs  02/12/15 1642 02/12/15 2103 02/13/15 0704  GLUCAP 166* 136* 127*    Wt Readings from Last 3 Encounters:  02/02/15 64.4 kg (141 lb 15.6 oz)  02/01/15 64.2 kg (141 lb 8.6 oz)    Physical Exam:  HENT:  Head:  Normocephalic.  Eyes:  Pupil reactive to light   Neck: Normal range of motion. Neck supple. No thyromegaly present.  Dysphonic voice persistent Cardiovascular: Normal rate and regular rhythm.  Respiratory: Effort normal and breath sounds normal. No respiratory distress.  GI: Soft. Bowel sounds are normal. He exhibits no distension.  Neurological:  Patient is alert, less restless and but still impulsive.  Limited awareness of his deficits. Followed simple commands. Moves all 4's with at least 4/5 strength proximal to distal in upper/lower limbs.   Skin: Skin is warm and dry.  Psychiatric:  Pleasant and cooperative in general   Assessment/Plan: 1. 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 Physiatrist is providing close team supervision and 24 hour management of active medical problems listed below. Physiatrist and rehab team continue to assess barriers to discharge/monitor patient progress toward functional and medical goals. Will have life vest for 3 months, ICD if repeat ECHO does not show improvement Team conference today please see physician documentation under team conference tab, met with team face-to-face to discuss problems,progress, and goals. Formulized individual treatment plan based on medical history, underlying problem and comorbidities. FIM: FIM - Bathing Bathing Steps Patient Completed: Chest, Right Arm, Left Arm, Abdomen, Buttocks, Right upper leg, Left upper leg, Front perineal area, Right lower leg (including foot), Left lower leg (including foot) Bathing: 5: Supervision: Safety issues/verbal cues  FIM - Upper Body Dressing/Undressing Upper body dressing/undressing steps patient completed: Thread/unthread right sleeve of pullover shirt/dresss, Thread/unthread left sleeve of pullover  shirt/dress, Put head through opening of pull over shirt/dress, Pull  shirt over trunk Upper body dressing/undressing: 5: Set-up assist to: Obtain clothing/put away FIM - Lower Body Dressing/Undressing Lower body dressing/undressing steps patient completed: Thread/unthread right pants leg, Thread/unthread left pants leg, Pull pants up/down, Don/Doff right sock, Don/Doff left sock, Don/Doff right shoe, Don/Doff left shoe, Fasten/unfasten right shoe, Fasten/unfasten left shoe, Thread/unthread right underwear leg, Thread/unthread left underwear leg, Pull underwear up/down Lower body dressing/undressing: 5: Set-up assist to: Don/Doff TED stocking  FIM - Toileting Toileting steps completed by patient: Adjust clothing prior to toileting, Performs perineal hygiene, Adjust clothing after toileting Toileting Assistive Devices: Grab bar or rail for support Toileting: 6: More than reasonable amount of time  FIM - Radio producer Devices: Grab bars, Insurance account manager Transfers: 5-To toilet/BSC: Supervision (verbal cues/safety issues), 5-From toilet/BSC: Supervision (verbal cues/safety issues)  FIM - Control and instrumentation engineer Devices: Arm rests Bed/Chair Transfer: 6: Supine > Sit: No assist, 6: Sit > Supine: No assist, 5: Bed > Chair or W/C: Supervision (verbal cues/safety issues), 5: Chair or W/C > Bed: Supervision (verbal cues/safety issues)  FIM - Locomotion: Wheelchair Distance: 150 Locomotion: Wheelchair: 0: Activity did not occur FIM - Locomotion: Ambulation Locomotion: Ambulation Assistive Devices: Nurse, adult Ambulation/Gait Assistance: 4: Min assist Locomotion: Ambulation: 4: Travels 150 ft or more with minimal assistance (Pt.>75%)  Comprehension Comprehension Mode: Auditory Comprehension: 5-Follows basic conversation/direction: With extra time/assistive device  Expression Expression Mode: Verbal Expression: 4-Expresses basic 75 - 89% of the time/requires cueing 10 - 24% of the time. Needs helper to occlude  trach/needs to repeat words.  Social Interaction Social Interaction: 4-Interacts appropriately 75 - 89% of the time - Needs redirection for appropriate language or to initiate interaction.  Problem Solving Problem Solving: 4-Solves basic 75 - 89% of the time/requires cueing 10 - 24% of the time  Memory Memory: 2-Recognizes or recalls 25 - 49% of the time/requires cueing 51 - 75% of the time Medical Problem List and Plan: 1. Functional deficits secondary to embolic CVA/ s/pSTEMI status post stenting. Fitted with LifeVest  -"speed" showering , vest can come off for 22mn,  2. DVT Prophylaxis/Anticoagulation: Aspirin/Brilinta as well as SCDs. Monitor for any signs of DVT 3. Pain Management: Tylenol as needed 4. Seizure disorder. Keppra 500 mg twice a day. EEG negative. 5. Neuropsych: This patient is capable of making decisions on his own behalf.  -bed alarm, ?mittens for safety---close observation/safety plan 6. Skin/Wound Care: Routine skin checks 7. Fluids/Electrolytes/Nutrition: Routine I&O's with follow-up chemistries  -inadequate intake presently 8. Dysphagia. Dysphagia 3 nectar liquids. Cough has improved ? Vocal cord edema or paralysis,? Emergent intubation will need ENT f/u 9. Hypertension. Coreg 25 mg twice a day, lisinopril 10 mg daily. Monitor with increased mobility 10. Diabetes mellitus and peripheral neuropathy. Hemoglobin A1c 9.3.  Check blood sugars before meals and at bedtime.  -sugars fairly controlled at present.   -increase lantus to 30u qam  (on 14u at home) 11. Alcohol abuse. Monitor for any signs of withdrawal provide counseling 12. Hyperlipidemia. Lipitor   LOS (Days) 12 A FACE TO FACE EVALUATION WAS PERFORMED  KCharlett Blake5/25/2016 8:44 AM

## 2015-02-13 NOTE — Progress Notes (Signed)
Occupational Therapy Session Note  Patient Details  Name: Timothy Lambert MRN: [id number] Date of Birth: 08/01/1956  Today's Date: 02/13/2015 OT Individual Time: [id number] and 1400-1433 OT Individual Time Calculation (min): 55 min and 33 min   Short Term Goals: Week 2:  OT Short Term Goal 1 (Week 2): STG = LTGs due to remaining LOS  Skilled Therapeutic Interventions/Progress Updates:    1) Engaged in ADL retraining with focus on functional transfers, mobility, and safety with self-care tasks.  Ambulated to room bathroom with quad cane with supervision, requiring time initially upon standing to obtain balance.  Completed bathing at sit > stand level in room shower with supervision, and cues for doffing Life Vest last to allow full 10 minutes for bathing.  Pt able to manage hook closure on Life Vest this session when doffing and donning.  Setup assist to ensure proper fit of Life Vest.  Dressing completed at sit > stand level at EOB with use of step stool for assistance with donning and fastening shoes.  Ambulated to ADL apt with quad cane with supervision and completed tub/shower transfer with use of tub bench with supervision and recalling education from yesterday.  Wife not present for family education.  2) Engaged in therapeutic activity with focus on family education and functional mobility.  Pt's wife and sister present.  Ambulated with quad cane with close supervision with pt demonstrating LOB to Rt and Lt after turning out of room requiring min assist to correct.  Unsteady on his feet finished ambulating to chair approx 20 feet away with quad cane and min assist.  Educated with on close supervision with ambulation, hand placement if needing physical assist, and recommendation for pt to stand for a minute or so to get his bearings initially upon standing prior to ambulation.  Vitals assessed, noted to have dropped with standing and ambulation, notified RN.  Wife reports pt has been up all day  with no nap and pt reports not sleeping well.  Encouraged pt to remain well hydrated, encourage caloric intake, and rest, as well as listening to his body for when to rest.  Returned to room with min assist and quad cane and left in recliner with nectar thick water and encouraged to rest.    Therapy Documentation Precautions:  Precautions Precautions: Fall, Other (comment) Precaution Comments: [place] Lifevest Restrictions Weight Bearing Restrictions: No Pain:  Pt with no c/o pain  See FIM for current functional status  Therapy/Group: Individual Therapy  Simonne Come 02/13/2015, 9:31 AM

## 2015-02-14 ENCOUNTER — Inpatient Hospital Stay (HOSPITAL_COMMUNITY): Payer: Self-pay | Admitting: Speech Pathology

## 2015-02-14 LAB — GLUCOSE, CAPILLARY: Glucose-Capillary: 111 mg/dL — ABNORMAL HIGH (ref 65–99)

## 2015-02-14 MED ORDER — FOLIC ACID 1 MG PO TABS: 1.0000 mg | ORAL_TABLET | Freq: Every day | ORAL | Status: DC

## 2015-02-14 MED ORDER — LEVETIRACETAM 100 MG/ML PO SOLN: 500.0000 mg | Freq: Two times a day (BID) | ORAL | Status: DC

## 2015-02-14 MED ORDER — INSULIN GLARGINE 100 UNIT/ML ~~LOC~~ SOLN: 24.0000 [IU] | Freq: Every day | SUBCUTANEOUS | Status: DC

## 2015-02-14 MED ORDER — ATORVASTATIN CALCIUM 80 MG PO TABS: 80.0000 mg | ORAL_TABLET | Freq: Every day | ORAL | Status: DC

## 2015-02-14 MED ORDER — SULFAMETHOXAZOLE-TRIMETHOPRIM 800-160 MG PO TABS: 1.0000 | ORAL_TABLET | Freq: Two times a day (BID) | ORAL | Status: DC

## 2015-02-14 MED ORDER — LISINOPRIL 10 MG PO TABS: 10.0000 mg | ORAL_TABLET | Freq: Every day | ORAL | Status: DC

## 2015-02-14 MED ORDER — TICAGRELOR 90 MG PO TABS: 90.0000 mg | ORAL_TABLET | Freq: Two times a day (BID) | ORAL | Status: DC

## 2015-02-14 MED ORDER — FAMOTIDINE 20 MG PO TABS: 20.0000 mg | ORAL_TABLET | Freq: Two times a day (BID) | ORAL | Status: DC

## 2015-02-14 MED ORDER — CARVEDILOL 25 MG PO TABS: 25.0000 mg | ORAL_TABLET | Freq: Two times a day (BID) | ORAL | Status: DC

## 2015-02-14 NOTE — Progress Notes (Signed)
Social Work Discharge Note Discharge Note  The overall goal for the admission was met for:   Discharge location: Yes-HOME WITH WIFE WHO CAN PROVIDE 24 HR CARE  Length of Stay: Yes-13 DAYS  Discharge activity level: Yes-SUPERVISION/MIN LEVEL  Home/community participation: Yes  Services provided included: MD, RD, PT, OT, SLP, RN, CM, TR, Pharmacy and SW  Financial Services: Other: PENDING MEDICAID  Follow-up services arranged: Home Health: [facility]-PT,OT,SP,RN,SW, DME: [facility] and Patient/Family has no preference for HH/DME agencies  Comments (or additional information):WIFE COMPLETED FAMILY EDUCATION AND IS AWARE HE WILL NEED 24 HR CARE-CURRENTLY ON FMLA. PT'S DISABILITY AND MEDICAID ARE PENDING WIFE TO  FOLLOW UP WITH. [place] APPT 5/27 @ 10;00 FOR PCP AND ASSISTANCE WITH MEDICINES.  MATCH PROGRAM GIVEN TO WIFE FOR PT'S MEDS. DID NOT WANT ANY SUBSTANCE ABUSE RESOURCES-PT DOES NOT PLAN TO DRINK AGAIN-WIFE AWARE OF RESOURCES.  Patient/Family verbalized understanding of follow-up arrangements: Yes  Individual responsible for coordination of the follow-up plan: DORA-WIFE  Confirmed correct DME delivered: [name] 02/14/2015    [name]

## 2015-02-14 NOTE — Progress Notes (Signed)
Pt discharged to home with wife. Wife verbalized understanding of maintenance of life vest. Dan Angiulli-PA-C reviewed home medications and discharge instructions. Patients belongings collected and sent home with patient.

## 2015-02-14 NOTE — Plan of Care (Signed)
Problem: RH KNOWLEDGE DEFICIT Goal: RH STG INCREASE KNOWLEDGE OF DIABETES Pt will be able to describe symptoms of hyper/hypoglycemia to the RN with min asst. Pt will be able to explain glucose management and diabetic diet to RN with min assist.  Outcome: Completed/Met Date Met:  02/14/15 Discussed need to examine feet regularly for diabetic patient. Patient and wife verbalized understanding

## 2015-02-14 NOTE — Progress Notes (Signed)
Speech Language Pathology Discharge Summary  Patient Details  Name: Timothy Lambert MRN: [id number] Date of Birth: [birth year]  Today's Date: 02/14/2015 SLP Individual Time: 0930-1000 SLP Individual Time Calculation (min): 30 min   Skilled Therapeutic Interventions:  Pt was seen for skilled ST targeting completion of family education prior to discharge.  Upon arrival, pt was reclined in bed, awake, alert, and agreeable to participate in [place].  Pt's wife and brother were also present at bedside and remained actively engaged in education throughout session.  SLP provided skilled education regarding thickening liquids including how to determine whether liquids were thickened to the correct viscosity and where to buy thickening agents if needed.  Handout was provided to facilitate carryover in the home environment.  Pt's wife returned demonstration of how to thicken liquids appropriately with supervision.  SLP also provided pt's wife with a handout of dys 3 textures, including foods to avoid.  Pt's wife verbalized understanding of all education.  All her questions were answered to her satisfaction at this time.      Patient has met 7 of 7 long term goals.  Patient to discharge at [facility] level.  Reasons goals not met: n/a   Clinical Impression/Discharge Summary:  Pt made functional gains while inpatient and is discharging having met 7 out of 7 long term goals.  Pt currently requires min assist for basic, familiar cognitive tasks due to decreased selective attention to tasks, decreased functional problem solving, decreased intellectual/emergent awareness of deficits, and decreased recall of new information.  Pt utilizes increased vocal intensity to compensatory for dysphonia occurring s/p intubation to achieve intelligibility in sentences with supervision cues.  Pt is currently consuming dys 3 textures and nectar thick liquids with full supervision for use of swallowing precautions.  Pt's progress for  swallowing has been limited due to fluctuating alertness and decreased management of secretions with wet, congested cough; however, over the past several days pt has demonstrated consistent improvements in mentation and management of secretions and has demonstrated good toleration of thin liquid trials over 2 consecutive sessions this week.  Given that pt had evidence of silent aspiration of thin liquids on last MBS, recommend a repeat objective swallow study shortly after discharge to advance pt's diet to thin liquids.  Pt and family education is complete at this time.  Pt is discharging home with 24/7 supervision from wife.  Recommend ST follow up at next level of care to continue to address cognition and dysphagia.    Care Partner:  Caregiver Able to Provide Assistance: Yes  Type of Caregiver Assistance: Physical;Cognitive  Recommendation:  Home Health SLP;Outpatient SLP;24 hour supervision/assistance  Rationale for SLP Follow Up: Maximize functional communication;Maximize swallowing safety;Maximize cognitive function and independence;Reduce caregiver burden   Equipment: thickener    Reasons for discharge: Discharged from hospital   Patient/Family Agrees with Progress Made and Goals Achieved: Yes   See FIM for current functional status  PageSelinda Orion 02/14/2015, 12:46 PM

## 2015-02-15 ENCOUNTER — Ambulatory Visit: Payer: Medicaid Other | Attending: Family Medicine | Admitting: Family Medicine

## 2015-02-15 VITALS — BP 106/67 | HR 73 | Wt 150.2 lb

## 2015-02-15 DIAGNOSIS — Z8673 Personal history of transient ischemic attack (TIA), and cerebral infarction without residual deficits: Secondary | ICD-10-CM | POA: Insufficient documentation

## 2015-02-15 DIAGNOSIS — R1310 Dysphagia, unspecified: Secondary | ICD-10-CM | POA: Insufficient documentation

## 2015-02-15 DIAGNOSIS — E119 Type 2 diabetes mellitus without complications: Secondary | ICD-10-CM | POA: Diagnosis not present

## 2015-02-15 DIAGNOSIS — Z87891 Personal history of nicotine dependence: Secondary | ICD-10-CM | POA: Diagnosis not present

## 2015-02-15 DIAGNOSIS — Z7982 Long term (current) use of aspirin: Secondary | ICD-10-CM | POA: Diagnosis not present

## 2015-02-15 DIAGNOSIS — E1310 Other specified diabetes mellitus with ketoacidosis without coma: Secondary | ICD-10-CM | POA: Diagnosis not present

## 2015-02-15 DIAGNOSIS — I4891 Unspecified atrial fibrillation: Secondary | ICD-10-CM | POA: Insufficient documentation

## 2015-02-15 DIAGNOSIS — I2510 Atherosclerotic heart disease of native coronary artery without angina pectoris: Secondary | ICD-10-CM | POA: Diagnosis not present

## 2015-02-15 DIAGNOSIS — I252 Old myocardial infarction: Secondary | ICD-10-CM | POA: Diagnosis not present

## 2015-02-15 DIAGNOSIS — E1110 Type 2 diabetes mellitus with ketoacidosis without coma: Secondary | ICD-10-CM

## 2015-02-15 LAB — POCT GLYCOSYLATED HEMOGLOBIN (HGB A1C): Hemoglobin A1C: 9

## 2015-02-15 NOTE — Patient Instructions (Signed)
Continue with instructions and medications from hospital Follow-up with Dr. Jarold Song in one week. Go to ED if symptoms of concern occur.

## 2015-02-15 NOTE — Progress Notes (Signed)
Patient ID: Timothy Lambert, male   DOB: 04-09-1956, 59 y.o.   MRN: [id number]   Timothy Lambert, is a 59 y.o. male  [id number]  [id number]  DOB - June 22, 1956  CC:  Chief Complaint  Patient presents with  . New Patient    Stroke        [name] is a 59 y.o. male here today to establish medical care. He was in hospital from 5/03 to 5/13. His discharge diagnoses are listed below. He was in inpatient rehab from 5/13-5/26. He was discharged yesterday. He was told to follow-up here to establish care for routine care. He has a appointment with cardiology on July 12th. He an his family report he is doing well. We ambulates with a cane or walker. He has generalized weakness but no unitaleral weakness. He does have an issue with balance and he has dysphagia.He is wearing a lifevest external defibrillator due to his intermittent arrthymias. His medications are as listed below.    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 Coronary artery disease   . Hypertension   . Diabetes mellitus without complication   . Stroke   . Seizures   . Myocardial infarction    Current Outpatient Prescriptions on File Prior to Visit  Medication Sig Dispense Refill  . acetaminophen (TYLENOL) 325 MG tablet Take 2 tablets (650 mg total) by mouth every 4 (four) hours as needed for mild pain (temp > 101.5).    Marland Kitchen aspirin 81 MG chewable tablet Chew 1 tablet (81 mg total) by mouth daily.    Marland Kitchen atorvastatin (LIPITOR) 80 MG tablet Take 1 tablet (80 mg total) by mouth daily at 6 PM. 30 tablet 1  . carvedilol (COREG) 25 MG  tablet Take 1 tablet (25 mg total) by mouth 2 (two) times daily with a meal. 60 tablet 1  . docusate (COLACE) 50 MG/5ML liquid Take 10 mLs (100 mg total) by mouth 2 (two) times daily as needed for mild constipation. 100 mL 0  . famotidine (PEPCID) 20 MG tablet Take 1 tablet (20 mg total) by mouth 2 (two) times daily. 60 tablet 1  . folic acid (FOLVITE) 1 MG tablet Take 1 tablet (1 mg total) by mouth daily. 30 tablet 1  . insulin glargine (LANTUS) 100 UNIT/ML injection Inject 0.24 mLs (24 Units total) into the skin daily. 10 mL 11  . levETIRAcetam (KEPPRA) 100 MG/ML solution Take 5 mLs (500 mg total) by mouth every 12 (twelve) hours. 473 mL 12  . lisinopril (PRINIVIL,ZESTRIL) 10 MG tablet Take 1 tablet (10 mg total) by mouth daily. 30 tablet 1  . sulfamethoxazole-trimethoprim (BACTRIM DS,SEPTRA DS) 800-160 MG per tablet Take 1 tablet by mouth every 12 (twelve) hours. 6 tablet 0  . ticagrelor (BRILINTA) 90 MG TABS tablet Take 1 tablet (90 mg total) by mouth 2 (two) times daily. 60 tablet 1   No current facility-administered medications on file prior to visit.   No family history on file. History   Social History  . Marital Status: Married    Spouse Name: N/A  . Number of Children: N/A  . Years of Education: N/A   Occupational History  . Not on file.   Social  History Main Topics  . Smoking status: Former Smoker    Types: Cigarettes  . Smokeless tobacco: Never Used  . Alcohol Use: 16.8 oz/week    28 Cans of beer per week  . Drug Use: No  . Sexual Activity: Not Currently   Other Topics Concern  . Not on file   Social History Narrative    Review of Systems: Constitutional: Negative for fever, chills, appetite change, weight loss,  Fatigue.There is a generalized weakness HENT: Negative for ear pain, ear discharge.nose bleeds. Positive for sore scratchy throat (was intubated in hospital) Eyes: Negative for pain, discharge, redness, itching and visual disturbance. Neck: Negative for  pain, mild stiffness Respiratory: Negative for cough. Positive for shortness of breath with ambulating. He is ambulating at home with walker. Cardiovascular: Negative for chest pain, palpitations and leg swelling. He is unaware of palpitations but has AFib and is wearing a life-pack Gastrointestinal: Negative for abdominal distention, abdominal pain, nausea, vomiting, diarrhea, constipations Genitourinary: Negative for dysuria, urgency, frequency, hematuria, flank pain,  Musculoskeletal: Negative for back pain, joint pain, joint  swelling, arthralgia Positive for weakness. Neurological: Negative for dizziness, tremors,  syncope,   light-headedness, numbness and headaches. Positive for seizure X 1, off balance with ambulation. Hematological: Negative for easy bruising or bleeding Psychiatric/Behavioral: Negative for depression, anxiety Positive for decreased short-term memory. decreased concentration, confusion   Objective:   Filed Vitals:   02/15/15 1007  BP: 106/67  Pulse: 73    Physical Exam: Constitutional: Patient appears well-developed and well-nourished. No distress. His voice in hoarse and not very clear    He exhits generalized weakness and required help with ambulation HENT: Normocephalic, atraumatic, External right and left ear normal. Oropharynx is clear and moist.  Eyes: Conjunctivae and EOM are normal. PERRLA, no scleral icterus. Neck: Normal ROM. Neck supple. No lymphadenopathy, No thyromegaly. CVS: RRR, S1/S2 +, no murmurs, no gallops, no rubs Pulmonary: Effort and breath sounds normal, no stridor, rhonchi, wheezes, rales.  Abdominal: Soft. Normoactive BS,, no distension, tenderness, rebound or guarding.  Musculoskeletal: Normal range of motion. No edema and no tenderness.  Neuro: Alert.Normal muscle tone coordination. Non-focal . Cranial Nerves II-XII intact. There is no arm drift and arm and leg strenght @+ bilaterally. Skin: Skin is warm and dry. No rash noted. Not  diaphoretic. No erythema. No pallor. Psychiatric: Normal mood and affect. Behavior, judgment, thought content normal.  A1C 9.today BP  BP 106/67  Lab Results  Component Value Date   WBC 10.1 02/08/2015   HGB 11.3* 02/08/2015   HCT 32.4* 02/08/2015   MCV 86.9 02/08/2015   PLT 384 02/08/2015   Lab Results  Component Value Date   CREATININE 1.24 02/11/2015   BUN 22* 02/11/2015   NA 137 02/11/2015   K 4.7 02/11/2015   CL 108 02/11/2015   CO2 23 02/11/2015    Lab Results  Component Value Date   HGBA1C 9.3* 01/26/2015   Lipid Panel     Component Value Date/Time   CHOL 143 01/26/2015 0349   TRIG 192* 01/29/2015 0430   HDL 40* 01/26/2015 0349   CHOLHDL 3.6 01/26/2015 0349   VLDL 33 01/26/2015 0349   LDLCALC 70 01/26/2015 0349       Assessment and plan: Diabetes -continue current treatment - follow-up with Dr. Jarold Song in [place] in one week.  Recent hospitalizations with multipe diagnoses including, MI, Atrial Fib and embolic CVA. - Continue with outpatient PT - Follow-up with Dr. Jarold Song in one week.     -  POCT glycosylated hemoglobin (Hb A1C)   Return in about 1 week (around 02/22/2015).  The patient was given clear instructions to go to ER or return to medical center if symptoms don't improve, worsen or new problems develop. The patient verbalized understanding. The patient was told to call to get lab results if they haven't heard anything in the next week.     Micheline Chapman, MSN, FNP-BC [place], [place]   02/15/2015, 10:44 AM

## 2015-02-19 ENCOUNTER — Encounter (HOSPITAL_COMMUNITY): Payer: Self-pay | Admitting: Physical Medicine and Rehabilitation

## 2015-02-19 ENCOUNTER — Emergency Department (HOSPITAL_COMMUNITY): Payer: Medicaid Other

## 2015-02-19 ENCOUNTER — Inpatient Hospital Stay (HOSPITAL_COMMUNITY)
Admission: EM | Admit: 2015-02-19 | Discharge: 2015-02-21 | DRG: 683 | Disposition: A | Discharge status: Home / Self Care | Payer: Medicaid Other | Attending: Internal Medicine | Admitting: Internal Medicine

## 2015-02-19 ENCOUNTER — Telehealth: Payer: Self-pay | Admitting: Cardiovascular Disease

## 2015-02-19 ENCOUNTER — Telehealth: Payer: Self-pay | Admitting: *Deleted

## 2015-02-19 DIAGNOSIS — Z87891 Personal history of nicotine dependence: Secondary | ICD-10-CM

## 2015-02-19 DIAGNOSIS — E860 Dehydration: Secondary | ICD-10-CM | POA: Diagnosis present

## 2015-02-19 DIAGNOSIS — E875 Hyperkalemia: Secondary | ICD-10-CM | POA: Diagnosis present

## 2015-02-19 DIAGNOSIS — E785 Hyperlipidemia, unspecified: Secondary | ICD-10-CM | POA: Diagnosis present

## 2015-02-19 DIAGNOSIS — I2510 Atherosclerotic heart disease of native coronary artery without angina pectoris: Secondary | ICD-10-CM | POA: Diagnosis present

## 2015-02-19 DIAGNOSIS — I5040 Unspecified combined systolic (congestive) and diastolic (congestive) heart failure: Secondary | ICD-10-CM | POA: Diagnosis not present

## 2015-02-19 DIAGNOSIS — I5042 Chronic combined systolic (congestive) and diastolic (congestive) heart failure: Secondary | ICD-10-CM | POA: Diagnosis present

## 2015-02-19 DIAGNOSIS — N183 Chronic kidney disease, stage 3 unspecified: Secondary | ICD-10-CM | POA: Diagnosis present

## 2015-02-19 DIAGNOSIS — I69321 Dysphasia following cerebral infarction: Secondary | ICD-10-CM | POA: Diagnosis not present

## 2015-02-19 DIAGNOSIS — R569 Unspecified convulsions: Secondary | ICD-10-CM | POA: Diagnosis present

## 2015-02-19 DIAGNOSIS — R531 Weakness: Secondary | ICD-10-CM

## 2015-02-19 DIAGNOSIS — E876 Hypokalemia: Secondary | ICD-10-CM | POA: Diagnosis present

## 2015-02-19 DIAGNOSIS — N19 Unspecified kidney failure: Secondary | ICD-10-CM

## 2015-02-19 DIAGNOSIS — K219 Gastro-esophageal reflux disease without esophagitis: Secondary | ICD-10-CM | POA: Diagnosis present

## 2015-02-19 DIAGNOSIS — I252 Old myocardial infarction: Secondary | ICD-10-CM

## 2015-02-19 DIAGNOSIS — Z7982 Long term (current) use of aspirin: Secondary | ICD-10-CM | POA: Diagnosis not present

## 2015-02-19 DIAGNOSIS — R1310 Dysphagia, unspecified: Secondary | ICD-10-CM

## 2015-02-19 DIAGNOSIS — N179 Acute kidney failure, unspecified: Principal | ICD-10-CM

## 2015-02-19 DIAGNOSIS — I129 Hypertensive chronic kidney disease with stage 1 through stage 4 chronic kidney disease, or unspecified chronic kidney disease: Secondary | ICD-10-CM | POA: Diagnosis present

## 2015-02-19 DIAGNOSIS — E1165 Type 2 diabetes mellitus with hyperglycemia: Secondary | ICD-10-CM | POA: Diagnosis present

## 2015-02-19 DIAGNOSIS — Z955 Presence of coronary angioplasty implant and graft: Secondary | ICD-10-CM | POA: Diagnosis not present

## 2015-02-19 DIAGNOSIS — I6340 Cerebral infarction due to embolism of unspecified cerebral artery: Secondary | ICD-10-CM | POA: Diagnosis present

## 2015-02-19 DIAGNOSIS — I639 Cerebral infarction, unspecified: Secondary | ICD-10-CM | POA: Diagnosis present

## 2015-02-19 DIAGNOSIS — R270 Ataxia, unspecified: Secondary | ICD-10-CM

## 2015-02-19 DIAGNOSIS — I10 Essential (primary) hypertension: Secondary | ICD-10-CM | POA: Diagnosis present

## 2015-02-19 HISTORY — DX: Acute kidney failure, unspecified: N17.9

## 2015-02-19 HISTORY — DX: Reserved for inherently not codable concepts without codable children: IMO0001

## 2015-02-19 HISTORY — DX: Procedure and treatment not carried out because of patient's decision for reasons of belief and group pressure: Z53.1

## 2015-02-19 HISTORY — DX: Heart failure, unspecified: I50.9

## 2015-02-19 HISTORY — DX: Hyperlipidemia, unspecified: E78.5

## 2015-02-19 HISTORY — DX: Gastro-esophageal reflux disease without esophagitis: K21.9

## 2015-02-19 HISTORY — DX: Type 2 diabetes mellitus without complications: E11.9

## 2015-02-19 LAB — CBC WITH DIFFERENTIAL/PLATELET
Basophils Absolute: 0 10*3/uL (ref 0.0–0.1)
Basophils Relative: 1 % (ref 0–1)
EOS PCT: 7 % — AB (ref 0–5)
Eosinophils Absolute: 0.5 10*3/uL (ref 0.0–0.7)
HEMATOCRIT: 31.9 % — AB (ref 39.0–52.0)
HEMOGLOBIN: 11 g/dL — AB (ref 13.0–17.0)
LYMPHS ABS: 1.4 10*3/uL (ref 0.7–4.0)
LYMPHS PCT: 21 % (ref 12–46)
MCH: 29.8 pg (ref 26.0–34.0)
MCHC: 34.5 g/dL (ref 30.0–36.0)
MCV: 86.4 fL (ref 78.0–100.0)
MONO ABS: 0.4 10*3/uL (ref 0.1–1.0)
MONOS PCT: 6 % (ref 3–12)
Neutro Abs: 4.3 10*3/uL (ref 1.7–7.7)
Neutrophils Relative %: 65 % (ref 43–77)
Platelets: 307 10*3/uL (ref 150–400)
RBC: 3.69 MIL/uL — ABNORMAL LOW (ref 4.22–5.81)
RDW: 12.7 % (ref 11.5–15.5)
WBC: 6.6 10*3/uL (ref 4.0–10.5)

## 2015-02-19 LAB — COMPREHENSIVE METABOLIC PANEL
ALT: 24 U/L (ref 17–63)
ANION GAP: 8 (ref 5–15)
AST: 19 U/L (ref 15–41)
Albumin: 3.1 g/dL — ABNORMAL LOW (ref 3.5–5.0)
Alkaline Phosphatase: 64 U/L (ref 38–126)
BILIRUBIN TOTAL: 0.4 mg/dL (ref 0.3–1.2)
BUN: 44 mg/dL — AB (ref 6–20)
CO2: 22 mmol/L (ref 22–32)
CREATININE: 2.94 mg/dL — AB (ref 0.61–1.24)
Calcium: 8.7 mg/dL — ABNORMAL LOW (ref 8.9–10.3)
Chloride: 104 mmol/L (ref 101–111)
GFR calc Af Amer: 26 mL/min — ABNORMAL LOW (ref >60)
GFR calc non Af Amer: 22 mL/min — ABNORMAL LOW (ref >60)
Glucose, Bld: 137 mg/dL — ABNORMAL HIGH (ref 65–99)
Potassium: 6.1 mmol/L (ref 3.5–5.1)
Sodium: 134 mmol/L — ABNORMAL LOW (ref 135–145)
TOTAL PROTEIN: 6.4 g/dL — AB (ref 6.5–8.1)

## 2015-02-19 LAB — I-STAT CHEM 8, ED
BUN: 42 mg/dL — ABNORMAL HIGH (ref 6–20)
CHLORIDE: 106 mmol/L (ref 101–111)
Calcium, Ion: 1.2 mmol/L (ref 1.12–1.23)
Creatinine, Ser: 3 mg/dL — ABNORMAL HIGH (ref 0.61–1.24)
GLUCOSE: 134 mg/dL — AB (ref 65–99)
HEMATOCRIT: 28 % — AB (ref 39.0–52.0)
Hemoglobin: 9.5 g/dL — ABNORMAL LOW (ref 13.0–17.0)
POTASSIUM: 6.1 mmol/L — AB (ref 3.5–5.1)
Sodium: 135 mmol/L (ref 135–145)
TCO2: 19 mmol/L (ref 0–100)

## 2015-02-19 LAB — BRAIN NATRIURETIC PEPTIDE: B NATRIURETIC PEPTIDE 5: 77 pg/mL (ref 0.0–100.0)

## 2015-02-19 LAB — I-STAT CG4 LACTIC ACID, ED: Lactic Acid, Venous: 1.65 mmol/L (ref 0.5–2.0)

## 2015-02-19 LAB — I-STAT TROPONIN, ED: Troponin i, poc: 0.06 ng/mL (ref 0.00–0.08)

## 2015-02-19 LAB — ETHANOL: Alcohol, Ethyl (B): <5 mg/dL (ref <5)

## 2015-02-19 MED ORDER — HEPARIN SODIUM (PORCINE) 5000 UNIT/ML IJ SOLN
5000.0000 [IU] | Freq: Three times a day (TID) | INTRAMUSCULAR | Status: DC
  Administered 2015-02-20 – 2015-02-21 (×5): 5000 [IU] via SUBCUTANEOUS
  Filled 2015-02-19 (×9): qty 1

## 2015-02-19 MED ORDER — ACETAMINOPHEN 325 MG PO TABS
650.0000 mg | ORAL_TABLET | Freq: Four times a day (QID) | ORAL | Status: DC | PRN
Start: 2015-02-19 — End: 2015-02-21

## 2015-02-19 MED ORDER — TICAGRELOR 90 MG PO TABS
90.0000 mg | ORAL_TABLET | Freq: Two times a day (BID) | ORAL | Status: DC
  Administered 2015-02-20 – 2015-02-21 (×4): 90 mg via ORAL
  Filled 2015-02-19 (×5): qty 1

## 2015-02-19 MED ORDER — DOCUSATE SODIUM 50 MG/5ML PO LIQD
100.0000 mg | Freq: Two times a day (BID) | ORAL | Status: DC | PRN
  Filled 2015-02-19: qty 10

## 2015-02-19 MED ORDER — SODIUM CHLORIDE 0.9 % IV BOLUS (SEPSIS)
1000.0000 mL | Freq: Once | INTRAVENOUS | Status: AC
  Administered 2015-02-19: 1000 mL via INTRAVENOUS

## 2015-02-19 MED ORDER — SODIUM CHLORIDE 0.9 % IV SOLN
INTRAVENOUS | Status: DC
  Administered 2015-02-19 – 2015-02-21 (×3): via INTRAVENOUS

## 2015-02-19 MED ORDER — SODIUM POLYSTYRENE SULFONATE 15 GM/60ML PO SUSP
30.0000 g | Freq: Once | ORAL | Status: AC
  Administered 2015-02-19: 30 g via ORAL
  Filled 2015-02-19: qty 120

## 2015-02-19 MED ORDER — SODIUM CHLORIDE 0.9 % IJ SOLN
3.0000 mL | Freq: Two times a day (BID) | INTRAMUSCULAR | Status: DC
  Administered 2015-02-20: 3 mL via INTRAVENOUS

## 2015-02-19 MED ORDER — LEVETIRACETAM 100 MG/ML PO SOLN
500.0000 mg | Freq: Two times a day (BID) | ORAL | Status: DC
  Administered 2015-02-20 – 2015-02-21 (×4): 500 mg via ORAL
  Filled 2015-02-19 (×5): qty 5

## 2015-02-19 MED ORDER — ASPIRIN 81 MG PO CHEW
81.0000 mg | CHEWABLE_TABLET | Freq: Every day | ORAL | Status: DC
  Administered 2015-02-20 – 2015-02-21 (×2): 81 mg via ORAL
  Filled 2015-02-19 (×2): qty 1

## 2015-02-19 MED ORDER — SODIUM BICARBONATE 8.4 % IV SOLN
50.0000 meq | Freq: Once | INTRAVENOUS | Status: AC
  Administered 2015-02-19: 50 meq via INTRAVENOUS
  Filled 2015-02-19: qty 50

## 2015-02-19 MED ORDER — DEXTROSE 50 % IV SOLN
50.0000 mL | Freq: Once | INTRAVENOUS | Status: AC
  Administered 2015-02-19: 50 mL via INTRAVENOUS
  Filled 2015-02-19: qty 50

## 2015-02-19 MED ORDER — INSULIN ASPART 100 UNIT/ML ~~LOC~~ SOLN
5.0000 [IU] | Freq: Once | SUBCUTANEOUS | Status: AC
  Administered 2015-02-19: 5 [IU] via SUBCUTANEOUS
  Filled 2015-02-19: qty 1

## 2015-02-19 MED ORDER — ATORVASTATIN CALCIUM 80 MG PO TABS
80.0000 mg | ORAL_TABLET | Freq: Every day | ORAL | Status: DC
  Administered 2015-02-20: 80 mg via ORAL
  Filled 2015-02-19 (×2): qty 1

## 2015-02-19 MED ORDER — HYDRALAZINE HCL 20 MG/ML IJ SOLN: 5.0000 mg | INTRAMUSCULAR | Status: DC | PRN

## 2015-02-19 MED ORDER — ACETAMINOPHEN 325 MG PO TABS: 650.0000 mg | ORAL_TABLET | ORAL | Status: DC | PRN

## 2015-02-19 MED ORDER — ACETAMINOPHEN 650 MG RE SUPP: 650.0000 mg | Freq: Four times a day (QID) | RECTAL | Status: DC | PRN

## 2015-02-19 MED ORDER — CARVEDILOL 25 MG PO TABS
25.0000 mg | ORAL_TABLET | Freq: Two times a day (BID) | ORAL | Status: DC
  Administered 2015-02-20 – 2015-02-21 (×3): 25 mg via ORAL
  Filled 2015-02-19 (×7): qty 1

## 2015-02-19 MED ORDER — INSULIN GLARGINE 100 UNIT/ML ~~LOC~~ SOLN
16.0000 [IU] | Freq: Every day | SUBCUTANEOUS | Status: DC
  Administered 2015-02-20 – 2015-02-21 (×2): 16 [IU] via SUBCUTANEOUS
  Filled 2015-02-19 (×2): qty 0.16

## 2015-02-19 MED ORDER — FOLIC ACID 1 MG PO TABS
1.0000 mg | ORAL_TABLET | Freq: Every day | ORAL | Status: DC
  Administered 2015-02-20 – 2015-02-21 (×2): 1 mg via ORAL
  Filled 2015-02-19 (×2): qty 1

## 2015-02-19 MED ORDER — ENSURE ENLIVE PO LIQD
237.0000 mL | Freq: Two times a day (BID) | ORAL | Status: DC
  Administered 2015-02-20 – 2015-02-21 (×3): 237 mL via ORAL

## 2015-02-19 MED ORDER — SODIUM CHLORIDE 0.9 % IV BOLUS (SEPSIS)
1000.0000 mL | Freq: Once | INTRAVENOUS | Status: AC
Start: 2015-02-19 — End: 2015-02-19
  Administered 2015-02-19: 1000 mL via INTRAVENOUS

## 2015-02-19 MED ORDER — FAMOTIDINE 20 MG PO TABS
20.0000 mg | ORAL_TABLET | Freq: Two times a day (BID) | ORAL | Status: DC
  Administered 2015-02-20 – 2015-02-21 (×4): 20 mg via ORAL
  Filled 2015-02-19 (×5): qty 1

## 2015-02-19 NOTE — ED Notes (Signed)
Pt attempting to give urine sample

## 2015-02-19 NOTE — Telephone Encounter (Signed)
Left message on machine for pt to contact the office.    Reviewed the pt's chart and this is Dr Claris Gladden patient.  Will need to address earlier [facility] visit.

## 2015-02-19 NOTE — ED Notes (Signed)
Attempted report states will call back in 10 minutes.

## 2015-02-19 NOTE — ED Notes (Signed)
Pt presents to department for evaluation of generalized weakness and fatigue. Recently discharged from hospital. Wearing life vest upon arrival to ED. Denies chest pain, respirations unlabored. Pt is alert and oriented x4.

## 2015-02-19 NOTE — ED Provider Notes (Signed)
CSN: [id number]     Arrival date & time 02/19/15  1818 History   First MD Initiated Contact with Patient 02/19/15 1830     Chief Complaint  Patient presents with  . Fatigue     (Consider location/radiation/quality/duration/timing/severity/associated sxs/prior Treatment) HPI Comments: The patient is a very ill-appearing 59 year old male, history of heavy alcohol use, presents to the hospital with his first visit since being discharged approximately one week ago. 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 He had stenting, he was placed on anticoagulation, he spent over one week in rehabilitation before he was discharged home in the care of his wife. Since being home he has had no complaints, he has been wearing the life vest (echocardiogram 35% ejection fraction with diastolic dysfunction). He has not had any alcohol per the patient or his wife however his brother was at the house this morning and she thinks he may have given them alcohol. He denies this. He denies chest pain or shortness of breath but states that he feels very weak and had difficulty standing or walking without significant assistance from the wife. The patient did not have any falls, he denies headache, denies numbness, denies focal weakness.   The history is provided by the patient, the spouse and medical records.    Past Medical History  Diagnosis Date  . Coronary artery disease   . Hypertension   . Diabetes mellitus without complication   . Stroke   . Seizures   . Myocardial infarction    Past Surgical History  Procedure Laterality Date  . Cardiac catheterization N/A 01/23/2015    Procedure: Left Heart Cath and Coronary Angiography;  Surgeon: [name], MD;  Location: [facility] INVASIVE CV LAB CUPID;  Service: Cardiovascular;   Laterality: N/A;  . Cardiac catheterization N/A 01/23/2015    Procedure: Coronary Stent Intervention;  Surgeon: [name], MD;  Location: [facility] INVASIVE CV LAB CUPID;  Service: Cardiovascular;  Laterality: N/A;   History reviewed. No pertinent family history. History  Substance Use Topics  . Smoking status: Former Smoker    Types: Cigarettes  . Smokeless tobacco: Never Used  . Alcohol Use: 16.8 oz/week    28 Cans of beer per week    Review of Systems  All other systems reviewed and are negative.     Allergies  Review of patient's allergies indicates no known allergies.  Home Medications   Prior to Admission medications   Medication Sig Start Date End Date Taking? Authorizing Provider  aspirin 81 MG chewable tablet Chew 1 tablet (81 mg total) by mouth daily. 02/02/15  Yes Cherene Altes, MD  atorvastatin (LIPITOR) 80 MG tablet Take 1 tablet (80 mg total) by mouth daily at 6 PM. 02/14/15  Yes Lavon Paganini Angiulli, PA-C  carvedilol (COREG) 25 MG tablet Take 1 tablet (25 mg total) by mouth 2 (two) times daily with a meal. 02/14/15  Yes Daniel J Angiulli, PA-C  docusate (COLACE) 50 MG/5ML liquid Take 10 mLs (100 mg total) by mouth 2 (two) times daily as needed for mild constipation. 02/01/15  Yes Cherene Altes, MD  folic acid (FOLVITE) 1 MG tablet Take 1 tablet (1 mg total) by mouth daily. 02/14/15  Yes Daniel J Angiulli, PA-C  insulin glargine (LANTUS) 100 UNIT/ML injection Inject 0.24 mLs (24 Units total) into the skin daily. Patient taking differently:  Inject 24 Units into the skin every morning.  02/14/15  Yes Daniel J Angiulli, PA-C  levETIRAcetam (KEPPRA) 100 MG/ML solution Take 5 mLs (500 mg total) by mouth every 12 (twelve) hours. 02/14/15  Yes Daniel J Angiulli, PA-C  lisinopril (PRINIVIL,ZESTRIL) 10 MG tablet Take 1 tablet (10 mg total) by mouth daily. 02/14/15  Yes Daniel J Angiulli, PA-C  ticagrelor (BRILINTA) 90 MG TABS tablet Take 1 tablet (90 mg total) by mouth 2 (two) times  daily. 02/14/15  Yes Daniel J Angiulli, PA-C  acetaminophen (TYLENOL) 325 MG tablet Take 2 tablets (650 mg total) by mouth every 4 (four) hours as needed for mild pain (temp > 101.5). 02/01/15   Cherene Altes, MD  famotidine (PEPCID) 20 MG tablet Take 1 tablet (20 mg total) by mouth 2 (two) times daily. 02/14/15   Lavon Paganini Angiulli, PA-C  sulfamethoxazole-trimethoprim (BACTRIM DS,SEPTRA DS) 800-160 MG per tablet Take 1 tablet by mouth every 12 (twelve) hours. Patient not taking: Reported on 02/19/2015 02/14/15   Lavon Paganini Angiulli, PA-C   BP 121/70 mmHg  Pulse 68  Temp(Src) 97.9 F (36.6 C) (Oral)  Resp 13  SpO2 98% Physical Exam  Constitutional: He appears well-developed and well-nourished. No distress.  HENT:  Head: Normocephalic and atraumatic.  Mouth/Throat: Oropharynx is clear and moist. No oropharyngeal exudate.  Eyes: Conjunctivae and EOM are normal. Pupils are equal, round, and reactive to light. Right eye exhibits no discharge. Left eye exhibits no discharge. No scleral icterus.  Neck: Normal range of motion. Neck supple. No JVD present. No thyromegaly present.  Cardiovascular: Normal rate, regular rhythm, normal heart sounds and intact distal pulses.  Exam reveals no gallop and no friction rub.   No murmur heard. Weak pulses at the radial arteries, no peripheral edema, no JVD  Pulmonary/Chest: Effort normal and breath sounds normal. No respiratory distress. He has no wheezes. He has no rales.  Abdominal: Soft. Bowel sounds are normal. He exhibits no distension and no mass. There is no tenderness.  Musculoskeletal: Normal range of motion. He exhibits no edema or tenderness.  Lymphadenopathy:    He has no cervical adenopathy.  Neurological: He is alert. Coordination normal.  Diffuse mild generalized weakness, 4+ out of 5 strength in all 4 extremities, cranial nerves III through XII appear intact, the patient is able to do finger-nose-finger from the supine position, there is no  pronator drift, he is unable to ambulate without significant assistance as he is falling from side to side and has ataxia.  Skin: Skin is warm and dry. No rash noted. No erythema.  Psychiatric: He has a normal mood and affect. His behavior is normal.  Nursing note and vitals reviewed.   ED Course  Procedures (including critical care time) Labs Review Labs Reviewed  CBC WITH DIFFERENTIAL/PLATELET - Abnormal; Notable for the following:    RBC 3.69 (*)    Hemoglobin 11.0 (*)    HCT 31.9 (*)    Eosinophils Relative 7 (*)    All other components within normal limits  COMPREHENSIVE METABOLIC PANEL - Abnormal; Notable for the following:    Sodium 134 (*)    Potassium 6.1 (*)    Glucose, Bld 137 (*)    BUN 44 (*)    Creatinine, Ser 2.94 (*)    Calcium 8.7 (*)    Total Protein 6.4 (*)    Albumin 3.1 (*)    GFR calc non Af Amer 22 (*)    GFR calc Af Amer 26 (*)  All other components within normal limits  I-STAT CHEM 8, ED - Abnormal; Notable for the following:    Potassium 6.1 (*)    BUN 42 (*)    Creatinine, Ser 3.00 (*)    Glucose, Bld 134 (*)    Hemoglobin 9.5 (*)    HCT 28.0 (*)    All other components within normal limits  BRAIN NATRIURETIC PEPTIDE  ETHANOL  URINALYSIS, ROUTINE W REFLEX MICROSCOPIC (NOT AT [facility])  I-STAT TROPOININ, ED  I-STAT CG4 LACTIC ACID, ED  I-STAT CG4 LACTIC ACID, ED    Imaging Review Ct Head Wo Contrast  02/19/2015   CLINICAL DATA:  Altered mental status and fatigue  EXAM: CT HEAD WITHOUT CONTRAST  TECHNIQUE: Contiguous axial images were obtained from the base of the skull through the vertex without intravenous contrast.  COMPARISON:  Head CT Jan 23, 2015; brain MRI Jan 24, 2015  FINDINGS: Mild diffuse atrophy is stable. There is no intracranial mass, hemorrhage, extra-axial fluid collection, or midline shift. There is patchy small vessel disease in the centra semiovale bilaterally, stable. The small lacunar infarcts noted on the recent prior MR are not  well seen on CT examination. No well-defined acute infarct is appreciable currently on this CT examination. The bony calvarium appears intact. The mastoid air cells are clear.  IMPRESSION: Atrophy with periventricular small vessel disease. The small acute infarcts noted on recent MR are not appreciable by CT. No new gray-white compartment lesion is seen by CT. No hemorrhage or mass effect.   Electronically Signed   By: Lowella Grip III M.D.   On: 02/19/2015 19:20   Dg Chest Port 1 View  02/19/2015   CLINICAL DATA:  Generalized weakness and fatigue  EXAM: PORTABLE CHEST - 1 VIEW  COMPARISON:  Jan 28, 2015  FINDINGS: There are overlying monitor leads. There is no edema or consolidation. Heart size and pulmonary vascularity are normal. No adenopathy. No bone lesions.  IMPRESSION: No edema or consolidation.   Electronically Signed   By: Lowella Grip III M.D.   On: 02/19/2015 19:11     EKG Interpretation   Date/Time:  Tuesday Feb 19 2015 18:22:36 EDT Ventricular Rate:  67 PR Interval:  158 QRS Duration: 86 QT Interval:  406 QTC Calculation: 429 R Axis:   29 Text Interpretation:  Normal sinus rhythm Inferior infarct , age  undetermined Anterior infarct , age undetermined ST \\T \ T wave  abnormality, consider lateral ischemia Abnormal ECG Since last tracing  rate slower T wave inversion in the lateral and precordial leads unchanged  q waves in septal leads with evolving prior MI Confirmed by Seniya Stoffers  MD,  Na Waldrip ([id number]) on 02/19/2015 6:32:03 PM      MDM   Final diagnoses:  Ataxia  Renal failure    The EKG shows signs of evolution of his myocardial infarction, there is no new ST elevation, his vital signs are concerning for hypotension, he is definitely ataxic which is a new finding however knowing that he had recent strokes but symmetric risk for increased stroke and if he is anticoagulated for MRI stroke. CT scan of the head, fluids, cardiac workup, anticipate admission to the  hospital.  CT scan reviewed, labs reviewed, the patient has acute renal failure, likely prerenal given his elevated BUN, only. Present, he has been unable to give any urine, IV fluids have improved his hypotension, he is now stable for admission to a telemetry unit. Discussed with admitting doctor who agrees.  Meds given in  ED:  Medications  dextrose 50 % solution 50 mL (not administered)  sodium bicarbonate injection 50 mEq (not administered)  sodium polystyrene (KAYEXALATE) 15 GM/60ML suspension 30 g (not administered)  sodium chloride 0.9 % bolus 1,000 mL (0 mLs Intravenous Stopped 02/19/15 2123)  sodium chloride 0.9 % bolus 1,000 mL (1,000 mLs Intravenous New Bag/Given 02/19/15 2123)      Noemi Chapel, MD 02/19/15 2144

## 2015-02-19 NOTE — ED Notes (Signed)
Admitting at bedside. States give 5 Units of insulin with D50

## 2015-02-19 NOTE — Telephone Encounter (Signed)
Patient's wife called and stated that the patient is very, very week.  He fell at home today, but did not hurt himself.  I advised Mrs. Mousel to contact the cardiologist he saw while in the ED and that if this happens again they should go back to the ED.  I am more concerned about the CHF at this point because the Heart Cath was cancelled/ to be rescheduled at a future time.

## 2015-02-19 NOTE — ED Notes (Signed)
Pt still unable to give urine sample at this time.

## 2015-02-19 NOTE — Telephone Encounter (Signed)
New message     Patient wife calling C/O weaker, spoke with nurse from [facility] who ask her was his feet and ankles swollen - wife replied no.

## 2015-02-19 NOTE — H&P (Signed)
[facility] History and Physical  Timothy Lambert [id number] DOB: [birth year] DOA: 02/19/2015  Referring physician: ED physician PCP: Sharon Seller, NP  Specialists:   Chief Complaint: Generalized weakness  [name] is a 59 y.o. male with PMH of hypertension, hyperlipidemia, diabetes mellitus, GERD, recent stroke and STEMI, seizure, systolic congestive heart failure, chronic kidney disease-stage III, hx of alcohol abuse, who presents with generalized weakness.  Patient was recently hospitalized from 5/13 to 02/14/15 because of acute stroke and STEMI. Patient is s/p of stent placement, and was discharged on Brilinta. He spent over one week in rehabilitation before he was discharged home in the care of his wife. Patient reports that he has generalized weakness and decreased oral intake. He feels very weak and had difficulty standing or walking without significant assistance from the wife. Because of recent stroke, and he has dysphasia. He is on liquid diet. He thinks he is not taking enough IV fluids. He does not have pain anywhere, no fever, chills or shortness of breath. Pt reports that he has not had any alcohol recently and is doing fine without drinking alcohol.  Currently patient denies fever, chills, running nose, ear pain, headaches, cough, chest pain, SOB, abdominal pain, diarrhea, constipation, dysuria, urgency, frequency, hematuria, skin rashes or leg swelling. No new unilateral weakness, numbness or tingling sensations. No vision change or hearing loss.  In ED, patient was found to have negative CT head for acute abnormalities. Potassium 6.1 without EKG T wave changes, AoCKD-III, initial troponin negative, BNP 77, lactate 1.65, temperature normal, no tachycardia, negative alcohol level, chest x-ray negative for acute abnormalities. Patient is admitted to inpatient for further evaluation and treatment.  Where does patient live?   At home    Can patient participate  in ADLs?  Barely  Review of Systems:   General: no fevers, chills, no changes in body weight, has poor appetite, has fatigue HEENT: no blurry vision, hearing changes or sore throat Pulm: no dyspnea, coughing, wheezing CV: no chest pain, palpitations Abd: no nausea, vomiting, abdominal pain, diarrhea, constipation GU: no dysuria, burning on urination, increased urinary frequency, hematuria  Ext: no leg edema Neuro: no new unilateral weakness, numbness, or tingling, no vision change or hearing loss. Has ataxia Skin: no rash MSK: No muscle spasm, no deformity, no limitation of range of movement in spin Heme: No easy bruising.  Travel history: No recent long distant travel.  Allergy: No Known Allergies  Past Medical History  Diagnosis Date  . Coronary artery disease   . Hypertension   . HLD (hyperlipidemia)   . GERD (gastroesophageal reflux disease)   . Refusal of blood transfusions as patient is Jehovah's Witness   . CHF (congestive heart failure)   . Myocardial infarction 01/22/2015    "massive"  . Type II diabetes mellitus   . Seizures 01/22/2015    "in front of paramedics"  . Stroke 01/22/2015    "short term memory loss & right side weak since" (02/19/2015)  . Acute renal failure (ARF) 02/19/2015    Past Surgical History  Procedure Laterality Date  . Cardiac catheterization N/A 01/23/2015    Procedure: Left Heart Cath and Coronary Angiography;  Surgeon: [name], MD;  Location: [facility] INVASIVE CV LAB CUPID;  Service: Cardiovascular;  Laterality: N/A;  . Cardiac catheterization N/A 01/23/2015    Procedure: Coronary Stent Intervention;  Surgeon: [name], MD;  Location: [facility] INVASIVE CV LAB CUPID;  Service: Cardiovascular;  Laterality: N/A;    Social History:  reports that he has quit smoking. His smoking use included Cigarettes. He has a 4.62 pack-year smoking history. He has never used smokeless tobacco. He reports that he drinks alcohol. He reports that he does not use illicit  drugs.  Family History: History reviewed. No pertinent family history.   Prior to Admission medications   Medication Sig Start Date End Date Taking? Authorizing Provider  aspirin 81 MG chewable tablet Chew 1 tablet (81 mg total) by mouth daily. 02/02/15  Yes Cherene Altes, MD  atorvastatin (LIPITOR) 80 MG tablet Take 1 tablet (80 mg total) by mouth daily at 6 PM. 02/14/15  Yes Lavon Paganini Angiulli, PA-C  carvedilol (COREG) 25 MG tablet Take 1 tablet (25 mg total) by mouth 2 (two) times daily with a meal. 02/14/15  Yes Daniel J Angiulli, PA-C  docusate (COLACE) 50 MG/5ML liquid Take 10 mLs (100 mg total) by mouth 2 (two) times daily as needed for mild constipation. 02/01/15  Yes Cherene Altes, MD  folic acid (FOLVITE) 1 MG tablet Take 1 tablet (1 mg total) by mouth daily. 02/14/15  Yes Daniel J Angiulli, PA-C  insulin glargine (LANTUS) 100 UNIT/ML injection Inject 0.24 mLs (24 Units total) into the skin daily. Patient taking differently: Inject 24 Units into the skin every morning.  02/14/15  Yes Daniel J Angiulli, PA-C  levETIRAcetam (KEPPRA) 100 MG/ML solution Take 5 mLs (500 mg total) by mouth every 12 (twelve) hours. 02/14/15  Yes Daniel J Angiulli, PA-C  lisinopril (PRINIVIL,ZESTRIL) 10 MG tablet Take 1 tablet (10 mg total) by mouth daily. 02/14/15  Yes Daniel J Angiulli, PA-C  ticagrelor (BRILINTA) 90 MG TABS tablet Take 1 tablet (90 mg total) by mouth 2 (two) times daily. 02/14/15  Yes Daniel J Angiulli, PA-C  acetaminophen (TYLENOL) 325 MG tablet Take 2 tablets (650 mg total) by mouth every 4 (four) hours as needed for mild pain (temp > 101.5). 02/01/15   Cherene Altes, MD  famotidine (PEPCID) 20 MG tablet Take 1 tablet (20 mg total) by mouth 2 (two) times daily. 02/14/15   Lavon Paganini Angiulli, PA-C  sulfamethoxazole-trimethoprim (BACTRIM DS,SEPTRA DS) 800-160 MG per tablet Take 1 tablet by mouth every 12 (twelve) hours. Patient not taking: Reported on 02/19/2015 02/14/15   Cathlyn Parsons,  PA-C    Physical Exam: Filed Vitals:   02/19/15 2130 02/19/15 2145 02/19/15 2354 02/20/15 0421  BP: 121/70 107/45 112/63 150/78  Pulse: 68 76 87 72  Temp:   98.2 F (36.8 C) 97.9 F (36.6 C)  TempSrc:   Oral Oral  Resp: 13 15 16 18   Height:   5\' 6"  (1.676 m)   Weight:   67.2 kg (148 lb 2.4 oz) 67.2 kg (148 lb 2.4 oz)  SpO2: 98% 99% 100% 100%   General: Not in acute distress, dry mucous in the membrane HEENT:       Eyes: PERRL, EOMI, no scleral icterus.       ENT: No discharge from the ears and nose, no pharynx injection, no tonsillar enlargement.        Neck: No JVD, no bruit, no mass felt. Heme: No neck lymph node enlargement. Cardiac: S1/S2, RRR, No murmurs, No gallops or rubs. Pulm:  No rales, wheezing, rhonchi or rubs. Abd: Soft, nondistended, nontender, no rebound pain, no organomegaly, BS present. Ext: No pitting leg edema bilaterally. 2+DP/PT pulse bilaterally. Musculoskeletal: No joint deformities, No joint redness or warmth, no limitation of ROM in spin. Skin: No rashes.  Neuro:  Alert, oriented X3, cranial nerves II-XII grossly intact, muscle strength 4/5 in all extremities, sensation to light touch intact. Brachial reflex 1+ bilaterally. Knee reflex 1+ bilaterally. Negative Babinski's sign. Normal finger to nose test.  Psych: Patient is not psychotic, no suicidal or hemocidal ideation.  Labs on Admission:  Basic Metabolic Panel:  Recent Labs Lab 02/19/15 2020 02/19/15 2031 02/20/15 0509  NA 134* 135 138  K 6.1* 6.1* 4.8  CL 104 106 105  CO2 22  --  23  GLUCOSE 137* 134* 65  BUN 44* 42* 37*  CREATININE 2.94* 3.00* 2.30*  CALCIUM 8.7*  --  8.6*   Liver Function Tests:  Recent Labs Lab 02/19/15 2020  AST 19  ALT 24  ALKPHOS 64  BILITOT 0.4  PROT 6.4*  ALBUMIN 3.1*   No results for input(s): LIPASE, AMYLASE in the last 168 hours. No results for input(s): AMMONIA in the last 168 hours. CBC:  Recent Labs Lab 02/19/15 1835 02/19/15 2031  02/19/15 2333 02/20/15 0509  WBC 6.6  --  6.6 7.2  NEUTROABS 4.3  --  3.8  --   HGB 11.0* 9.5* 9.8* 9.3*  HCT 31.9* 28.0* 27.7* 26.7*  MCV 86.4  --  86.0 86.1  PLT 307  --  252 249   Cardiac Enzymes:  Recent Labs Lab 02/19/15 2333 02/20/15 0509  TROPONINI 0.05* 0.06*    BNP (last 3 results)  Recent Labs  02/19/15 1835 02/20/15 0509  BNP 77.0 113.3*    ProBNP (last 3 results) No results for input(s): PROBNP in the last 8760 hours.  CBG:  Recent Labs Lab 02/13/15 1659 02/13/15 2132 02/14/15 0648 02/20/15 0610 02/20/15 0645  GLUCAP 124* 131* 111* 64* 82    Radiological Exams on Admission: Ct Head Wo Contrast  02/19/2015   CLINICAL DATA:  Altered mental status and fatigue  EXAM: CT HEAD WITHOUT CONTRAST  TECHNIQUE: Contiguous axial images were obtained from the base of the skull through the vertex without intravenous contrast.  COMPARISON:  Head CT Jan 23, 2015; brain MRI Jan 24, 2015  FINDINGS: Mild diffuse atrophy is stable. There is no intracranial mass, hemorrhage, extra-axial fluid collection, or midline shift. There is patchy small vessel disease in the centra semiovale bilaterally, stable. The small lacunar infarcts noted on the recent prior MR are not well seen on CT examination. No well-defined acute infarct is appreciable currently on this CT examination. The bony calvarium appears intact. The mastoid air cells are clear.  IMPRESSION: Atrophy with periventricular small vessel disease. The small acute infarcts noted on recent MR are not appreciable by CT. No new gray-white compartment lesion is seen by CT. No hemorrhage or mass effect.   Electronically Signed   By: Lowella Grip III M.D.   On: 02/19/2015 19:20   Dg Chest Port 1 View  02/19/2015   CLINICAL DATA:  Generalized weakness and fatigue  EXAM: PORTABLE CHEST - 1 VIEW  COMPARISON:  Jan 28, 2015  FINDINGS: There are overlying monitor leads. There is no edema or consolidation. Heart size and pulmonary  vascularity are normal. No adenopathy. No bone lesions.  IMPRESSION: No edema or consolidation.   Electronically Signed   By: Lowella Grip III M.D.   On: 02/19/2015 19:11    EKG: Independently reviewed.  Abnormal findings: T-wave inversion in lead I and aVL, ST elevation in V2 to V4, T-wave inversion in V2 to V6, all of these changes are similar to previous EKG on 02/09/15.   Assessment/Plan  Principal Problem:   Generalized weakness Active Problems:   History of ST elevation myocardial infarction (STEMI)   Seizure   CVA (cerebral vascular accident)   Dysphagia   Combined systolic and diastolic congestive heart failure   Diabetes type 2, uncontrolled   HLD (hyperlipidemia)   Embolic cerebral infarction   Hyperkalemia   Essential hypertension   CAD (coronary artery disease)   Acute renal failure superimposed on stage 3 chronic kidney disease  Generalized weakness: It is most likely due to deconditioning secondary to multiple comorbidities, including dehydration, new worsening renal function and electrolyte disturbance.  -will admit to tele bed -check UA adn urine culture to r/o UTI -correct hyperkalemia -Check orthostatic status -IV fluid for dehydration -Pt/ot  Hyperkalemia: Potassium 6.1 without EKG T wave change. This is obviously due to worsening renal function. -Treated with insulin, bicarbonate, and Kayexalate -follow up by BMP  AoCKD-III: Previous Cre is 1.24 on 02/11/15, his Cre is 3.00 on admission. Likely due to prerenal secondary to dehydration and continuation of ACEI. -Hold lisinopril -IVF: Normal saline 2 L followed by 75 mL per hour -Check FeNa -US-renal -Follow up renal function by BMP -Avoid ACEI and NSAIDs  CAD: recent STEMI, s/p of stent. On brilinta now. No chest pain, but given his generalized weakness, will check cardiac exam to rule out new MRI. -trop x 3 -continue brilinta and ASA -coreg  Hyperlipidemia: LDL was 75/7/16. -Continue  Lipitor  Seizure: Stable. -Continue Keppra  Recent hx of stroke: no new issues. CT head is negative for acute abnormalities. -Continue aspirin and Lipitor -Dysphagia diet   DM-II: Last A1c 9.0 on 02/15/15. Patient is taking Lantus 24 units daily at home -will decrease Lantus dose from  24-16 units daily -SSI  Combined systolic and diastolic congestive heart failure: 2-D echo 01/24/15 showed EF 30-35% with grade 1 diastolic dysfunction. CHF is compensated on admission. Patient does not have any leg edema. Actually he is clinically dry. BNP 77. -Observe volume status closely.  DVT ppx: SQ Heparin     Code Status: Full code Family Communication:  Yes, patient's  Wife at bed side Disposition Plan: Admit to inpatient   Date of Service 02/20/2015    Ivor Costa [facility] Pager [phone]  If 7PM-7AM, please contact night-coverage [email or web address] Password [facility] 02/20/2015, 7:58 AM

## 2015-02-20 DIAGNOSIS — E1165 Type 2 diabetes mellitus with hyperglycemia: Secondary | ICD-10-CM

## 2015-02-20 DIAGNOSIS — E785 Hyperlipidemia, unspecified: Secondary | ICD-10-CM

## 2015-02-20 DIAGNOSIS — R1310 Dysphagia, unspecified: Secondary | ICD-10-CM

## 2015-02-20 DIAGNOSIS — I10 Essential (primary) hypertension: Secondary | ICD-10-CM

## 2015-02-20 DIAGNOSIS — N183 Chronic kidney disease, stage 3 (moderate): Secondary | ICD-10-CM

## 2015-02-20 DIAGNOSIS — I252 Old myocardial infarction: Secondary | ICD-10-CM

## 2015-02-20 DIAGNOSIS — I5040 Unspecified combined systolic (congestive) and diastolic (congestive) heart failure: Secondary | ICD-10-CM

## 2015-02-20 DIAGNOSIS — N179 Acute kidney failure, unspecified: Principal | ICD-10-CM

## 2015-02-20 DIAGNOSIS — E875 Hyperkalemia: Secondary | ICD-10-CM

## 2015-02-20 DIAGNOSIS — R531 Weakness: Secondary | ICD-10-CM

## 2015-02-20 DIAGNOSIS — R569 Unspecified convulsions: Secondary | ICD-10-CM

## 2015-02-20 LAB — CBC WITH DIFFERENTIAL/PLATELET
Basophils Absolute: 0 K/uL (ref 0.0–0.1)
Basophils Relative: 1 % (ref 0–1)
Eosinophils Absolute: 0.6 K/uL (ref 0.0–0.7)
Eosinophils Relative: 9 % — ABNORMAL HIGH (ref 0–5)
HCT: 27.7 % — ABNORMAL LOW (ref 39.0–52.0)
Hemoglobin: 9.8 g/dL — ABNORMAL LOW (ref 13.0–17.0)
Lymphocytes Relative: 26 % (ref 12–46)
Lymphs Abs: 1.7 K/uL (ref 0.7–4.0)
MCH: 30.4 pg (ref 26.0–34.0)
MCHC: 35.4 g/dL (ref 30.0–36.0)
MCV: 86 fL (ref 78.0–100.0)
Monocytes Absolute: 0.5 K/uL (ref 0.1–1.0)
Monocytes Relative: 8 % (ref 3–12)
Neutro Abs: 3.8 K/uL (ref 1.7–7.7)
Neutrophils Relative %: 56 % (ref 43–77)
Platelets: 252 K/uL (ref 150–400)
RBC: 3.22 MIL/uL — ABNORMAL LOW (ref 4.22–5.81)
RDW: 12.8 % (ref 11.5–15.5)
WBC: 6.6 K/uL (ref 4.0–10.5)

## 2015-02-20 LAB — TROPONIN I
Troponin I: 0.05 ng/mL — ABNORMAL HIGH (ref <0.031)
Troponin I: 0.06 ng/mL — ABNORMAL HIGH (ref <0.031)
Troponin I: 0.05 ng/mL — ABNORMAL HIGH (ref <0.031)

## 2015-02-20 LAB — BASIC METABOLIC PANEL
Anion gap: 10 (ref 5–15)
BUN: 37 mg/dL — AB (ref 6–20)
CALCIUM: 8.6 mg/dL — AB (ref 8.9–10.3)
CO2: 23 mmol/L (ref 22–32)
Chloride: 105 mmol/L (ref 101–111)
Creatinine, Ser: 2.3 mg/dL — ABNORMAL HIGH (ref 0.61–1.24)
GFR calc Af Amer: 34 mL/min — ABNORMAL LOW (ref >60)
GFR, EST NON AFRICAN AMERICAN: 30 mL/min — AB (ref >60)
Glucose, Bld: 65 mg/dL (ref 65–99)
Potassium: 4.8 mmol/L (ref 3.5–5.1)
Sodium: 138 mmol/L (ref 135–145)

## 2015-02-20 LAB — CBC
HCT: 26.7 % — ABNORMAL LOW (ref 39.0–52.0)
Hemoglobin: 9.3 g/dL — ABNORMAL LOW (ref 13.0–17.0)
MCH: 30 pg (ref 26.0–34.0)
MCHC: 34.8 g/dL (ref 30.0–36.0)
MCV: 86.1 fL (ref 78.0–100.0)
Platelets: 249 K/uL (ref 150–400)
RBC: 3.1 MIL/uL — ABNORMAL LOW (ref 4.22–5.81)
RDW: 12.9 % (ref 11.5–15.5)
WBC: 7.2 K/uL (ref 4.0–10.5)

## 2015-02-20 LAB — GLUCOSE, CAPILLARY
GLUCOSE-CAPILLARY: 64 mg/dL — AB (ref 65–99)
GLUCOSE-CAPILLARY: 142 mg/dL — AB (ref 65–99)
GLUCOSE-CAPILLARY: 206 mg/dL — AB (ref 65–99)
Glucose-Capillary: 82 mg/dL (ref 65–99)
Glucose-Capillary: 225 mg/dL — ABNORMAL HIGH (ref 65–99)

## 2015-02-20 LAB — APTT: aPTT: 35 seconds (ref 24–37)

## 2015-02-20 LAB — PROTIME-INR
INR: 1.26 (ref 0.00–1.49)
PROTHROMBIN TIME: 16 s — AB (ref 11.6–15.2)

## 2015-02-20 LAB — BRAIN NATRIURETIC PEPTIDE: B Natriuretic Peptide: 113.3 pg/mL — ABNORMAL HIGH (ref 0.0–100.0)

## 2015-02-20 MED ORDER — INSULIN ASPART 100 UNIT/ML ~~LOC~~ SOLN
0.0000 [IU] | Freq: Three times a day (TID) | SUBCUTANEOUS | Status: DC
  Administered 2015-02-20: 1 [IU] via SUBCUTANEOUS
  Administered 2015-02-20 – 2015-02-21 (×2): 3 [IU] via SUBCUTANEOUS

## 2015-02-20 MED ORDER — INSULIN ASPART 100 UNIT/ML ~~LOC~~ SOLN
3.0000 [IU] | Freq: Once | SUBCUTANEOUS | Status: AC
  Administered 2015-02-20: 3 [IU] via SUBCUTANEOUS

## 2015-02-20 NOTE — Evaluation (Signed)
Physical Therapy Evaluation Patient Details Name: Timothy Lambert MRN: [id number] DOB: [birth year] Today's Date: 02/20/2015   History of Present Illness  Pt is a 59 y/o male with a PMH of HTN, hyperlipidemia, DM, GERD, recent stroke and STEMI, seizure, systolic CHF, chronic kidney disease stage III, and alcohol abuse who presents with general weakness. Pt with a recent hospital admission for CVA and STEMI, and completed a course of inpatient rehab prior to returning home with wife. Pt was home for about 3 days per his report, before presenting back to the ED.   Clinical Impression  Pt admitted with above diagnosis. Pt currently with functional limitations due to the deficits listed below (see PT Problem List). At the time of PT eval pt was able to perform transfers and ambulation with min guard to min assist. Pt does not provide much information regarding re-admission, only states he "felt weak and got sick again" after returning home. At end of session, it was concerning that pt was having visual difficulty. RN was notified. See General Comments for details. Pt will benefit from skilled PT to increase their independence and safety with mobility to allow discharge to the venue listed below. Planning on HHPT to continue after d/c, however if pt does not show improvement with therapy may want to consider a higher level of care.      Follow Up Recommendations Home health PT;Supervision/Assistance - 24 hour    Equipment Recommendations  None recommended by PT    Recommendations for Other Services OT consult     Precautions / Restrictions Precautions Precautions: Fall Precaution Comments: Zoll Lifevest Restrictions Weight Bearing Restrictions: No      Mobility  Bed Mobility Overal bed mobility: Needs Assistance Bed Mobility: Supine to Sit     Supine to sit: Min guard     General bed mobility comments: Pt required assist for manipulation of bed linen as well as close guard for trunk  support as pt transitioned into full sitting position.   Transfers Overall transfer level: Needs assistance Equipment used: Rolling walker (2 wheeled) Transfers: Sit to/from Stand Sit to Stand: Min assist         General transfer comment: Assist to power-up to full standing position. VC's for hand placement on seated surface for safety during stand>sit, however pt did not make corrective changes.   Ambulation/Gait Ambulation/Gait assistance: Min guard Ambulation Distance (Feet): 50 Feet Assistive device: Rolling walker (2 wheeled) Gait Pattern/deviations: Step-through pattern;Decreased stride length;Trunk flexed Gait velocity: Decreased Gait velocity interpretation: Below normal speed for age/gender General Gait Details: Pt moving very slow and guarded. Asks to turn around and return to room after ~25 feet. Pt states that he is scared his knees will buckle.   Stairs            Wheelchair Mobility    Modified Rankin (Stroke Patients Only)       Balance Overall balance assessment: Needs assistance Sitting-balance support: Feet supported;No upper extremity supported Sitting balance-Leahy Scale: Fair     Standing balance support: Bilateral upper extremity supported Standing balance-Leahy Scale: Poor                               Pertinent Vitals/Pain Pain Assessment: No/denies pain    Home Living Family/patient expects to be discharged to:: Private residence Living Arrangements: Spouse/significant other Available Help at Discharge: Family;Friend(s);Available 24 hours/day Type of Home: House Home Access: Stairs to enter Entrance Stairs-Rails: None  Entrance Stairs-Number of Steps: 4 Home Layout: Multi-level Home Equipment: Walker - 2 wheels;Cane - single point      Prior Function Level of Independence: Needs assistance   Gait / Transfers Assistance Needed: Pt states he was relying heavily on wife for ambulation and was using the RW all the time.    ADL's / Homemaking Assistance Needed: Pt's wife was assisting with ADL's after return home from rehab.         Hand Dominance   Dominant Hand: Right    Extremity/Trunk Assessment   Upper Extremity Assessment: Defer to OT evaluation           Lower Extremity Assessment: Generalized weakness RLE Deficits / Details: RLE very slightly weaker than LLE. Pt grossly weak bilaterally    Cervical / Trunk Assessment: Normal  Communication   Communication: Expressive difficulties (Very soft-spoken)  Cognition Arousal/Alertness: Lethargic Behavior During Therapy: Flat affect Overall Cognitive Status: Impaired/Different from baseline Area of Impairment: Problem solving;Attention   Current Attention Level: Alternating         Problem Solving: Slow processing General Comments: pt with accurate orientation but slow processing    General Comments General comments (skin integrity, edema, etc.): Pt appears to have some vision deficits however reports that he does not have visual impairments and only wears glasses for reading. Therapist set up breakfast tray for pt and he initially reports that his plate is empty, when corrected that there were eggs and a biscuit on his plate he felt around the plate and then agreed. Pt asking what various items on his tray were (the milk carton, the orange juice), and was holding the sugar packets very close to face to determine what they were before putting in his coffee.    Exercises        Assessment/Plan    PT Assessment Patient needs continued PT services  PT Diagnosis Difficulty walking;Generalized weakness   PT Problem List Decreased strength;Decreased range of motion;Decreased activity tolerance;Decreased balance;Decreased mobility;Decreased safety awareness;Decreased knowledge of use of DME;Decreased knowledge of precautions  PT Treatment Interventions Gait training;DME instruction;Stair training;Functional mobility training;Therapeutic  activities;Therapeutic exercise;Balance training;Patient/family education;Cognitive remediation   PT Goals (Current goals can be found in the Care Plan section) Acute Rehab PT Goals Patient Stated Goal: Pt did not state goals during session. PT Goal Formulation: With patient Time For Goal Achievement: 02/27/15 Potential to Achieve Goals: Good    Frequency Min 3X/week   Barriers to discharge        Co-evaluation               End of Session Equipment Utilized During Treatment: Gait belt Activity Tolerance: Patient limited by fatigue Patient left: in chair;with chair alarm set;with call bell/phone within reach Nurse Communication: Mobility status;Other (comment) (Apparent visual difficulty)         Time: [id number] PT Time Calculation (min) (ACUTE ONLY): 25 min   Charges:   PT Evaluation $Initial PT Evaluation Tier I: 1 Procedure PT Treatments $Gait Training: 8-22 mins   PT G Codes:        Rolinda Roan 03/07/2015, 10:34 AM   Rolinda Roan, PT, DPT Acute Rehabilitation Services Pager: [phone]

## 2015-02-20 NOTE — Care Management Utilization Note (Signed)
UR completed.    Adlai Sinning Wise Nathaly Dawkins, RN, BSN [phone] 

## 2015-02-20 NOTE — Evaluation (Addendum)
Occupational Therapy Evaluation Patient Details Name: Timothy Lambert MRN: [id number] DOB: 03/15/1956 Today's Date: 02/20/2015    History of Present Illness Pt is a 59 y.o. male with a PMH of HTN, hyperlipidemia, DM, GERD, recent stroke and STEMI, seizure, systolic CHF, chronic kidney disease stage III, and alcohol abuse who presents with general weakness. Pt with a recent hospital admission for CVA and STEMI, and completed a course of inpatient rehab prior to returning home with wife. Pt was home for about 3 days per his report, before presenting back to the ED.    Clinical Impression   Pt admitted with above. Pt receiving assist with ADLs, PTA. Feel pt will benefit from acute OT to increase strength and independence with BADLs prior to d/c.     Follow Up Recommendations  Home health OT;Supervision/Assistance - 24 hour    Equipment Recommendations  Tub/shower bench;3 in 1 bedside comode    Recommendations for Other Services       Precautions / Restrictions Precautions Precautions: Fall Precaution Comments: Zoll [employer]vest Restrictions Weight Bearing Restrictions: No      Mobility Bed Mobility Overal bed mobility: Needs Assistance Bed Mobility: Sit to Supine       Sit to supine: Supervision      Transfers Overall transfer level: Needs assistance   Transfers: Sit to/from Stand Sit to Stand: Min guard              Balance  One LOB in session requiring assist when up and ambulating with RW.                                          ADL Overall ADL's : Needs assistance/impaired     Grooming: Wash/dry face;Set up;Supervision/safety;Standing               Lower Body Dressing: Min guard;Sit to/from stand   Toilet Transfer: Ambulation;RW;Minimal assistance (chair/bed)           Functional mobility during ADLs: Minimal assistance;Rolling walker General ADL Comments: Educated on safety such as use of bag on walker and sitting for most  of LB ADLs-discussed standing with walker in front of her with someone present with him when standing to pull up LB clothing. Pt states he has shower chair coming (not sure what kind he is getting or if he is getting one; pt also not oriented to year).     Vision Pt reports no change from baseline; Pt wears reading glasses Pt unable to read handout OT gave him without glasses on Vision Assessment?: Yes Visual Fields: No apparent deficits   Perception     Praxis      Pertinent Vitals/Pain Pain Assessment: No/denies pain     Hand Dominance Right   Extremity/Trunk Assessment Upper Extremity Assessment Upper Extremity Assessment: Overall WFL for tasks assessed   Lower Extremity Assessment Lower Extremity Assessment: Defer to PT evaluation       Communication Communication Communication: Other (comment) (soft-spoken)   Cognition Arousal/Alertness:  (seemed lethargic) Behavior During Therapy: Flat affect (not the whole session) Overall Cognitive Status: No family/caregiver present to determine baseline cognitive functioning (not oriented to time)                     General Comments       Exercises       Shoulder Instructions      Home  Living Family/patient expects to be discharged to:: Private residence Living Arrangements: Spouse/significant other Available Help at Discharge: Family;Friend(s);Available 24 hours/day Type of Home: House Home Access: Stairs to enter [employer] of Steps: 4 Entrance Stairs-Rails: None Home Layout: Multi-level Alternate Level Stairs-Number of Steps: 7   Bathroom Shower/Tub: Corporate investment banker: Standard Bathroom Accessibility: Yes How Accessible: Accessible via walker Home Equipment: [place] - 2 wheels;Kasandra Knudsen - single point      Lives With: Spouse    Prior Functioning/Environment Level of Independence: Needs assistance  Gait / Transfers Assistance Needed: Pt states he was relying heavily  on wife for ambulation and was using the RW all the time.  ADL's / Homemaking Assistance Needed: Pt's wife was assisting with ADLs after return home from rehab.         OT Diagnosis: Generalized weakness   OT Problem List: Decreased knowledge of precautions;Decreased knowledge of use of DME or AE;Decreased cognition;Decreased strength;Impaired balance (sitting and/or standing);Impaired vision/perception   OT Treatment/Interventions: Self-care/ADL training;DME and/or AE instruction;Therapeutic activities;Cognitive remediation/compensation;Patient/family education;Balance training;Therapeutic exercise    OT Goals(Current goals can be found in the care plan section) Acute Rehab OT Goals Patient Stated Goal: not stated OT Goal Formulation: With patient Time For Goal Achievement: 02/27/15 Potential to Achieve Goals: Good ADL Goals Pt Will Perform Lower Body Bathing: with supervision;sit to/from stand Pt Will Perform Lower Body Dressing: with supervision;sit to/from stand Pt Will Transfer to Toilet: ambulating;with supervision Pt Will Perform Toileting - Clothing Manipulation and hygiene: sit to/from stand;with modified independence  OT Frequency: Min 2X/week   Barriers to D/C:            Co-evaluation              End of Session Equipment Utilized During Treatment: Other (comment);Gait belt;Rolling walker (life vest) Nurse Communication: Mobility status;Other (comment) (heart monitor not working; vision; d/c recommendations)  Activity Tolerance: Patient tolerated treatment well Patient left: in bed;with call bell/phone within reach;with bed alarm set   Time: [id number] OT Time Calculation (min): 15 min Charges:  OT General Charges $OT Visit: 1 Procedure OT Evaluation $Initial OT Evaluation Tier I: 1 Procedure G-CodesBenito Mccreedy OTR/L [id number] 02/20/2015, 3:54 PM

## 2015-02-20 NOTE — Progress Notes (Signed)
Hypoglycemic Event  CBG: 65  Treatment: 4 oz. Orange Juice  Symptoms: N/A  Follow-up CBG: [id number] CBG Result:82  Possible Reasons for Event: N/A  Comments/MD notified: N/A    Timothy Lambert A  Remember to initiate Hypoglycemia Order Set & complete

## 2015-02-20 NOTE — Progress Notes (Signed)
[facility]  Patient Status: Active (receiving services up to time of hospitalization)  [facility] is providing the following services: RN, OT, ST and MSW  If patient discharges after hours, please call [phone].   Timothy Lambert 02/20/2015, 11:31 AM

## 2015-02-20 NOTE — Progress Notes (Signed)
[facility]                                                                              Patient Demographics  Timothy Lambert, is a 59 y.o. male, DOB - May 19, 1956, [id number]  Admit date - 02/19/2015   Admitting Physician Timothy Costa, MD  Outpatient Primary MD for the patient is Timothy Seller, NP  LOS - 1   Chief Complaint  Patient presents with  . Fatigue      HPI on 02/19/2015 by Dr. Ivor Lambert Timothy Lambert is a 59 y.o. male with PMH of hypertension, hyperlipidemia, diabetes mellitus, GERD, recent stroke and STEMI, seizure, systolic congestive heart failure, chronic kidney disease-stage III, hx of alcohol abuse, who presents with generalized weakness. Patient was recently hospitalized from 5/13 to 02/14/15 because of acute stroke and STEMI. Patient is s/p of stent placement, and was discharged on Brilinta. He spent over one week in rehabilitation before he was discharged home in the care of his wife. Patient reports that he has generalized weakness and decreased oral intake. He feels very weak and had difficulty standing or walking without significant assistance from the wife. Because of recent stroke, and he has dysphasia. He is on liquid diet. He thinks he is not taking enough IV fluids. He does not have pain anywhere, no fever, chills or shortness of breath. Pt reports that he has not had any alcohol recently and is doing fine without drinking alcohol. Currently patient denies fever, chills, running nose, ear pain, headaches, cough, chest pain, SOB, abdominal pain, diarrhea, constipation, dysuria, urgency, frequency, hematuria, skin rashes or leg swelling. No new unilateral weakness, numbness or tingling sensations. No vision change or hearing loss. In ED, patient was found to have negative CT head for acute abnormalities. Potassium 6.1 without EKG T wave changes, AoCKD-III, initial troponin negative, BNP 77, lactate 1.65, temperature normal, no tachycardia, negative alcohol  level, chest x-ray negative for acute abnormalities. Patient is admitted to inpatient for further evaluation and treatment.  Assessment & Plan   Generalized weakness -It is most likely due to deconditioning secondary to multiple comorbidities, including dehydration, new worsening renal function and electrolyte disturbance.  -Pending UA -PT/OT recommended home health -Will check TSH level  Hyperkalemia -Upon admission, potassium 6.1  -Treated with insulin, bicarbonate, and Kayexalate -Potassium currently 4.8  Acute on chronic kidney disease, stage 3 -Previous Cre is 1.24 on 02/11/15, his Cre is 3.00 on admission.  -Likely due to prerenal secondary to dehydration and continuation of ACEI. -Hold lisinopril -Creatinine slightly improved, currently 2.3 -Continue to monitor BMP -Renal US pending  CAD -recent STEMI, s/p of stent. On brilinta now.  -Currently no chest pain -Troponin cycled and currently 0.05 -continue brilinta and ASA, coreg  Hyperlipidemia:  -LDL was 70 (01/26/2015) -Continue Lipitor  Seizure -Stable, continue Keppra  Recent hx of stroke  -no new issues. CT head is negative for acute abnormalities. -Continue aspirin and Lipitor -Dysphagia diet   DM-II -Last A1c 9.0 on 02/15/15. Patient is taking Lantus 24 units daily at home -will decrease Lantus dose from 24-16 units daily -SSI  Combined systolic and diastolic congestive heart failure -2-D echo 01/24/15 showed EF 30-35% with  grade 1 diastolic dysfunction.  -CHF is compensated on admission.  -Patient does not have any leg edema. -BNP 77. -Monitor intake/output, weights  Code Status: Full  Family Communication: None at bedside. Spoke with wife via phone.  Disposition Plan: Admitted.  Likely discharge on 02/21/2015  Time Spent in minutes   30 minutes  Procedures  None  Consults   None  DVT Prophylaxis  heparin  Lab Results  Component Value Date   PLT 249 02/20/2015     Medications  Scheduled Meds: . aspirin  81 mg Oral Daily  . atorvastatin  80 mg Oral q1800  . carvedilol  25 mg Oral BID WC  . famotidine  20 mg Oral BID  . feeding supplement (ENSURE ENLIVE)  237 mL Oral BID BM  . folic acid  1 mg Oral Daily  . heparin  5,000 Units Subcutaneous 3 times per day  . insulin aspart  0-9 Units Subcutaneous TID WC  . insulin glargine  16 Units Subcutaneous Daily  . levETIRAcetam  500 mg Oral Q12H  . sodium chloride  3 mL Intravenous Q12H  . ticagrelor  90 mg Oral BID   Continuous Infusions: . sodium chloride 75 mL/hr at 02/20/15 1128   PRN Meds:.acetaminophen **OR** acetaminophen, docusate, hydrALAZINE  Antibiotics    Anti-infectives    None      Subjective:   Timothy Lambert seen and examined today.  Patient states he is still feeling weak and would like to go home. He states his appetite is better that he can eat at home. He currently denies any chest pain, shortness of breath, abdominal pain, dizziness or lightheadedness.  Objective:   Filed Vitals:   02/19/15 2354 02/20/15 0421 02/20/15 1138 02/20/15 1304  BP: 112/63 150/78 115/54 94/54  Pulse: 87 72 75 78  Temp: 98.2 F (36.8 C) 97.9 F (36.6 C)  98 F (36.7 C)  TempSrc: Oral Oral  Oral  Resp: 16 18  18   Height: 5\' 6"  (1.676 m)     Weight: 67.2 kg (148 lb 2.4 oz) 67.2 kg (148 lb 2.4 oz)    SpO2: 100% 100%  100%    Wt Readings from Last 3 Encounters:  02/20/15 67.2 kg (148 lb 2.4 oz)  02/15/15 68.13 kg (150 lb 3.2 oz)  02/02/15 64.4 kg (141 lb 15.6 oz)     Intake/Output Summary (Last 24 hours) at 02/20/15 1417 Last data filed at 02/20/15 0900  Gross per 24 hour  Intake    243 ml  Output      2 ml  Net    241 ml    Exam  General: Well developed, no distress  HEENT: NCAT,mucous membranes moist.   Cardiovascular: S1 S2 auscultated, no rubs, murmurs or gallops. Regular rate and rhythm.  Respiratory: Clear to auscultation bilaterally with equal chest  rise  Abdomen: Soft, nontender, nondistended, + bowel sounds  Extremities: warm dry without cyanosis clubbing or edema  Neuro: AAOx3, strength equal and bilateral in upper/lower ext (overall weak)  Psych: Normal affect and demeanor    Data Review   Micro Results No results found for this or any previous visit (from the past 240 hour(s)).  Radiology Reports Ct Head Wo Contrast  02/19/2015   CLINICAL DATA:  Altered mental status and fatigue  EXAM: CT HEAD WITHOUT CONTRAST  TECHNIQUE: Contiguous axial images were obtained from the base of the skull through the vertex without intravenous contrast.  COMPARISON:  Head CT Jan 23, 2015; brain MRI  Jan 24, 2015  FINDINGS: Mild diffuse atrophy is stable. There is no intracranial mass, hemorrhage, extra-axial fluid collection, or midline shift. There is patchy small vessel disease in the centra semiovale bilaterally, stable. The small lacunar infarcts noted on the recent prior MR are not well seen on CT examination. No well-defined acute infarct is appreciable currently on this CT examination. The bony calvarium appears intact. The mastoid air cells are clear.  IMPRESSION: Atrophy with periventricular small vessel disease. The small acute infarcts noted on recent MR are not appreciable by CT. No new gray-white compartment lesion is seen by CT. No hemorrhage or mass effect.   Electronically Signed   By: Lowella Grip III M.D.   On: 02/19/2015 19:20   Ct Head Wo Contrast  01/23/2015   CLINICAL DATA:  Altered mental status  EXAM: CT HEAD WITHOUT CONTRAST  TECHNIQUE: Contiguous axial images were obtained from the base of the skull through the vertex without intravenous contrast.  COMPARISON:  None.  FINDINGS: There is no intracranial hemorrhage, mass or evidence of acute infarction. There is moderate generalized atrophy. There is mild chronic microvascular ischemic change. There is no significant extra-axial fluid collection.  No acute intracranial findings  are evident.  IMPRESSION: Moderate chronic atrophy and mild chronic small vessel changes. No acute intracranial findings. Mild motion degradation of the images.   Electronically Signed   By: Andreas Newport M.D.   On: 01/23/2015 00:33   Mr Brain Wo Contrast  01/24/2015   CLINICAL DATA:  Altered mental status, fell out of bed at 8:30 p.m., diaphoretic, witnessed seizure with incontinence. Possible alcohol withdrawal.  EXAM: MRI HEAD WITHOUT CONTRAST  TECHNIQUE: Multiplanar, multiecho pulse sequences of the brain and surrounding structures were obtained without intravenous contrast.  COMPARISON:  CT of the head Jan 23, 2015  FINDINGS: Multiple small foci of reduced diffusion and bifrontal lobes, bilateral occipital lobes, RIGHT parietal lobe and, RIGHT cerebellum, measuring up to 12 mm and RIGHT occipital lobe. Corresponding low ADC values. No susceptibility artifact to suggest hemorrhage. Ventricles and sulci are upper limits of normal in size for patient's age. Patchy white matter T2 hyperintensities noted, exclusive of the aforementioned abnormality.  No abnormal extra-axial fluid collections. Major intracranial vascular flow voids seen at the skull base. Imaged ocular globes and orbital contents are unremarkable. Mild paranasal sinus mucosal thickening without air-fluid levels. Mild bilateral mastoid effusions. No abnormal sellar expansion. No cerebellar tonsillar ectopia. No abnormal calvarial bone marrow signal. Patient is edentulous.  IMPRESSION: Multiple small foci of acute ischemia spanning multiple vascular territories (involving the cerebrum and cerebellum) most consistent with embolic event.  Parenchymal brain volume loss, upper limits of normal for age. Minimal white matter changes suggest chronic small vessel ischemic disease.   Electronically Signed   By: Elon Alas   On: 01/24/2015 03:51   Dg Chest Port 1 View  02/19/2015   CLINICAL DATA:  Generalized weakness and fatigue  EXAM: PORTABLE  CHEST - 1 VIEW  COMPARISON:  Jan 28, 2015  FINDINGS: There are overlying monitor leads. There is no edema or consolidation. Heart size and pulmonary vascularity are normal. No adenopathy. No bone lesions.  IMPRESSION: No edema or consolidation.   Electronically Signed   By: Lowella Grip III M.D.   On: 02/19/2015 19:11   Dg Chest Port 1 View  01/28/2015   CLINICAL DATA:  Respiratory failure.  EXAM: PORTABLE CHEST - 1 VIEW  COMPARISON:  None.  FINDINGS: Endotracheal tube and NG tube in  stable position. Right IJ line stable position. Mediastinum and hilar structures are normal. Stable cardiomegaly. Stable bibasilar subsegmental atelectasis and/or infiltrates. No pleural effusion or pneumothorax  IMPRESSION: 1. Lines and tubes in stable position. 2. Stable bibasilar atelectasis and/or infiltrates.   Electronically Signed   By: Plaza   On: 01/28/2015 07:07   Dg Chest Port 1 View  01/27/2015   CLINICAL DATA:  Respiratory failure. Intubated patient. History of coronary artery disease, hypertension and diabetes.  EXAM: PORTABLE CHEST - 1 VIEW  COMPARISON:  01/26/2015  FINDINGS: Endotracheal tube tip projects 2 cm above the carina. Nasogastric tube and right internal jugular central venous line are stable and well positioned. Mild hazy opacity in the medial left lung base appears mildly improved. This could reflect pneumonia or atelectasis. Remainder of the lungs is clear. No pneumothorax.  IMPRESSION: 1. Mild improvement in left lung base opacity which may reflect improved pneumonia or improved atelectasis. No new lung abnormalities. 2. Support apparatus is well positioned.   Electronically Signed   By: Lajean Manes M.D.   On: 01/27/2015 07:45   Dg Chest Port 1 View  01/26/2015   CLINICAL DATA:  Acute respiratory failure  EXAM: PORTABLE CHEST - 1 VIEW  COMPARISON:  01/25/2015  FINDINGS: The endotracheal tube tip is 3.7 cm above the carina. The nasogastric tube extends into the stomach. Mild airspace  opacity persists in the left base without significant interval change. There is no pneumothorax.  IMPRESSION: Support equipment appears satisfactorily positioned.  No significant interval change in the bilateral airspace opacities.   Electronically Signed   By: Andreas Newport M.D.   On: 01/26/2015 06:07   Dg Chest Port 1 View  01/25/2015   CLINICAL DATA:  Respiratory failure  EXAM: PORTABLE CHEST - 1 VIEW  COMPARISON:  01/24/2015  FINDINGS: The endotracheal tube tip is 3.4 cm above the carina. The right jugular central line extends into the SVC just below the azygos vein junction. The nasogastric tube extends into the stomach. There is improvement, with partial clearance of central and basilar airspace opacities. Mild consolidation persists in the left base. There is no pneumothorax.  IMPRESSION: Support equipment appears satisfactorily positioned.  Improved, with partial clearance of central and basilar airspace opacities. This probably represents resolving pulmonary edema.   Electronically Signed   By: Andreas Newport M.D.   On: 01/25/2015 06:12   Dg Chest Port 1 View  01/24/2015   CLINICAL DATA:  Endotracheal tube placement  EXAM: PORTABLE CHEST - 1 VIEW  COMPARISON:  01/23/2015  FINDINGS: Cardiac shadow is mildly enlarged but stable. A nasogastric catheter is been removed in the interval. A right central venous line is seen in the mid superior vena cava. Endotracheal tube is noted 2.4 cm above the carina. Diffuse increased density noted throughout both lungs similar to that seen on the prior exam consistent with a degree of pulmonary edema.  IMPRESSION: Stable pulmonary edema.  Tubes and lines as described.   Electronically Signed   By: Inez Catalina M.D.   On: 01/24/2015 15:17   Dg Chest Port 1 View  01/23/2015   CLINICAL DATA:  Central line placement and gastric tube placement  EXAM: PORTABLE CHEST - 1 VIEW  COMPARISON:  None.  FINDINGS: The endotracheal tube tip is 3.5 cm above the carina. The  gastric tube reaches the stomach but the side port is at or above the EG junction. The tube should be advanced for optimal placement. The right jugular central  line extends into the SVC.  There is no pneumothorax. There is mild central ground-glass opacity without dense focal airspace consolidation. There is no large effusion.  IMPRESSION: Satisfactory ET tube position. Gastric tube reaches the stomach but should be advanced for optimal placement. Central line is satisfactorily positioned. No pneumothorax.   Electronically Signed   By: Andreas Newport M.D.   On: 01/23/2015 05:31   Dg Chest Port 1 View  01/23/2015   CLINICAL DATA:  Seizure  EXAM: PORTABLE CHEST - 1 VIEW  COMPARISON:  None.  FINDINGS: Endotracheal tube 2.8 cm from carina. Normal cardiac silhouette. There is a left lower lobe opacity posterior to the heart. No pleural fluid. No pneumothorax. No overt pulmonary edema.  IMPRESSION: 1. Endotracheal tube in good position. 2. Left lower lobe atelectasis versus infiltrate.   Electronically Signed   By: Suzy Bouchard M.D.   On: 01/23/2015 00:02   Dg Abd Portable 1v  01/30/2015   CLINICAL DATA:  Nasogastric tube placement  EXAM: PORTABLE ABDOMEN - 1 VIEW  COMPARISON:  01/29/2015  FINDINGS: The enteric tube extends into the stomach with tip over the region of the proximal antrum.  IMPRESSION: Enteric tube extends into the stomach with tip in the proximal antrum   Electronically Signed   By: Andreas Newport M.D.   On: 01/30/2015 02:20   Dg Abd Portable 1v  01/29/2015   CLINICAL DATA:  Evaluate feeding tube placement.  EXAM: PORTABLE ABDOMEN - 1 VIEW  COMPARISON:  01/24/2015  FINDINGS: Small bore feeding tube is noted with tip in the peripyloric region.  The bowel gas pattern is unremarkable.  IMPRESSION: Feeding tube tip overlying the peripyloric region.   Electronically Signed   By: Margarette Canada M.D.   On: 01/29/2015 13:19   Dg Abd Portable 1v  01/24/2015   CLINICAL DATA:  NG tube placement   EXAM: PORTABLE ABDOMEN - 1 VIEW  COMPARISON:  Prior film same day  FINDINGS: There is NG tube with tip in left upper quadrant medially probable within proximal stomach. The tip of the NG tube is coilled.  IMPRESSION: NG tube with tip coiled in left upper quadrant medially probable within proximal stomach.   Electronically Signed   By: Lahoma Crocker M.D.   On: 01/24/2015 21:25   Dg Abd Portable 1v  01/24/2015   CLINICAL DATA:  Orogastric tube placement  EXAM: PORTABLE ABDOMEN - 1 VIEW  COMPARISON:  01/24/2015 3:08 p.m.  FINDINGS: Endotracheal tube and right neck catheter are in stable position. There is a new orogastric tube which is coiled in the distal esophagus with tip still at the level of the lower esophagus.  The upper abdominal bowel gas pattern is nonobstructive. Haziness of the lower chest, with further evaluation limited by extensive respiratory motion.  These results will be called to the ordering clinician or representative by the Radiologist Assistant, and communication documented in the PACS or zVision Dashboard.  IMPRESSION: The new orogastric tube is coiled in the distal esophagus. Recommend repositioning.   Electronically Signed   By: Monte Fantasia M.D.   On: 01/24/2015 19:19   Dg Swallowing Func-speech Pathology  01/31/2015    Objective Swallowing Evaluation:    Patient Details  Name: TENNISON SLOMKA MRN: [id number] Date of Birth: [birth year]  Today's Date: 01/31/2015 Time: SLP Start Time (ACUTE ONLY): 1005-SLP Stop Time (ACUTE ONLY): 1026 SLP Time Calculation (min) (ACUTE ONLY): 21 min  Past Medical History:  Past Medical History  Diagnosis Date  .  Coronary artery disease   . Hypertension   . Diabetes mellitus without complication    Past Surgical History:  Past Surgical History  Procedure Laterality Date  . Cardiac catheterization N/A 01/23/2015    Procedure: Left Heart Cath and Coronary Angiography;  Surgeon: [name], MD;  Location: [facility] INVASIVE CV LAB CUPID;  Service: Cardiovascular;    Laterality: N/A;  . Cardiac catheterization N/A 01/23/2015    Procedure: Coronary Stent Intervention;  Surgeon: [name], MD;   Location: [facility] INVASIVE CV LAB CUPID;  Service: Cardiovascular;  Laterality:  N/A;   HPI:  Other Pertinent Information: 59 y.o. M brought to [facility] ED 5/4 with  seizures. In ED, was altered and extremely agitated requiring intubation  for airway protection . EKG with STEMI. Taken urgently to cath and PTCI  to LAD. Extubated 5/6 with reintubation, extubated 5/9.  Pt has been NPO  since extubation due to acute reversible dysphagia.  Has pulled NG  multiple times.    No Data Recorded  Assessment / Plan / Recommendation CHL IP CLINICAL IMPRESSIONS 01/31/2015  Therapy Diagnosis Mild pharyngeal phase dysphagia  Clinical Impression Pt presents with a mild pharyngeal dysphagia c/b trace  aspiration of thin liquids with inconsistent cough response; mild  pharyngeal residue of solids post-swallow; delayed initiation.  Aspiration  likely secondary to continued impaired glottal closure s/p intubation.  Pt  is safe to initiate a dysphagia 3 diet with nectar-thick liquids; meds  crushed in puree.  He will require full supervision with meals due to  encephalopathy (impulsivity, poor self-monitoring of rate/bolus size).   Wife present for study and participated in education re: recommendations,  precautions.  SLP will continue to follow.       CHL IP TREATMENT RECOMMENDATION 01/31/2015  Treatment Recommendations Therapy as outlined in treatment plan below     CHL IP DIET RECOMMENDATION 01/31/2015  SLP Diet Recommendations Dysphagia 3 (Mech soft);Nectar  Liquid Administration via (None)  Medication Administration Crushed with puree  Compensations Slow rate;Small sips/bites  Postural Changes and/or Swallow Maneuvers (None)     CHL IP OTHER RECOMMENDATIONS 01/31/2015  Recommended Consults (None)  Oral Care Recommendations Oral care BID  Other Recommendations Order thickener from pharmacy     CHL IP FOLLOW UP  RECOMMENDATIONS 01/31/2015  Follow up Recommendations Inpatient Rehab     CHL IP FREQUENCY AND DURATION 01/31/2015  Speech Therapy Frequency (ACUTE ONLY) (None)  Treatment Duration 1 week     SLP Swallow Goals No flowsheet data found.  No flowsheet data found.    CHL IP REASON FOR REFERRAL 01/31/2015  Reason for Referral Objectively evaluate swallowing function     CHL IP ORAL PHASE 01/31/2015  Lips (None)  Tongue (None)  Mucous membranes (None)  Nutritional status (None)  Other (None)  Oxygen therapy (None)  Oral Phase WFL  Oral - Pudding Teaspoon (None)  Oral - Pudding Cup (None)  Oral - Honey Teaspoon (None)  Oral - Honey Cup (None)  Oral - Honey Syringe (None)  Oral - Nectar Teaspoon (None)  Oral - Nectar Cup (None)  Oral - Nectar Straw (None)  Oral - Nectar Syringe (None)  Oral - Ice Chips (None)  Oral - Thin Teaspoon (None)  Oral - Thin Cup (None)  Oral - Thin Straw (None)  Oral - Thin Syringe (None)  Oral - Puree (None)  Oral - Mechanical Soft (None)  Oral - Regular (None)  Oral - Multi-consistency (None)  Oral - Pill (None)  Oral Phase - Comment (  None)      CHL IP PHARYNGEAL PHASE 01/31/2015  Pharyngeal Phase Impaired  Pharyngeal - Pudding Teaspoon (None)  Penetration/Aspiration details (pudding teaspoon) (None)  Pharyngeal - Pudding Cup (None)  Penetration/Aspiration details (pudding cup) (None)  Pharyngeal - Honey Teaspoon (None)  Penetration/Aspiration details (honey teaspoon) (None)  Pharyngeal - Honey Cup (None)  Penetration/Aspiration details (honey cup) (None)  Pharyngeal - Honey Syringe (None)  Penetration/Aspiration details (honey syringe) (None)  Pharyngeal - Nectar Teaspoon (None)  Penetration/Aspiration details (nectar teaspoon) (None)  Pharyngeal - Nectar Cup (None)  Penetration/Aspiration details (nectar cup) (None)  Pharyngeal - Nectar Straw (None)  Penetration/Aspiration details (nectar straw) (None)  Pharyngeal - Nectar Syringe (None)  Penetration/Aspiration details (nectar syringe) (None)   Pharyngeal - Ice Chips (None)  Penetration/Aspiration details (ice chips) (None)  Pharyngeal - Thin Teaspoon (None)  Penetration/Aspiration details (thin teaspoon) (None)  Pharyngeal - Thin Cup (None)  Penetration/Aspiration details (thin cup) (None)  Pharyngeal - Thin Straw (None)  Penetration/Aspiration details (thin straw) (None)  Pharyngeal - Thin Syringe (None)  Penetration/Aspiration details (thin syringe') (None)  Pharyngeal - Puree (None)  Penetration/Aspiration details (puree) (None)  Pharyngeal - Mechanical Soft (None)  Penetration/Aspiration details (mechanical soft) (None)  Pharyngeal - Regular (None)  Penetration/Aspiration details (regular) (None)  Pharyngeal - Multi-consistency (None)  Penetration/Aspiration details (multi-consistency) (None)  Pharyngeal - Pill (None)  Penetration/Aspiration details (pill) (None)  Pharyngeal Comment (None)      No flowsheet data found.  No flowsheet data found.        Amanda L. Tivis Ringer, MA CCC/SLP Pager [phone]  [name] 01/31/2015, 10:47 AM     CBC  Recent Labs Lab 02/19/15 1835 02/19/15 2031 02/19/15 2333 02/20/15 0509  WBC 6.6  --  6.6 7.2  HGB 11.0* 9.5* 9.8* 9.3*  HCT 31.9* 28.0* 27.7* 26.7*  PLT 307  --  252 249  MCV 86.4  --  86.0 86.1  MCH 29.8  --  30.4 30.0  MCHC 34.5  --  35.4 34.8  RDW 12.7  --  12.8 12.9  LYMPHSABS 1.4  --  1.7  --   MONOABS 0.4  --  0.5  --   EOSABS 0.5  --  0.6  --   BASOSABS 0.0  --  0.0  --     Chemistries   Recent Labs Lab 02/19/15 2020 02/19/15 2031 02/20/15 0509  NA 134* 135 138  K 6.1* 6.1* 4.8  CL 104 106 105  CO2 22  --  23  GLUCOSE 137* 134* 65  BUN 44* 42* 37*  CREATININE 2.94* 3.00* 2.30*  CALCIUM 8.7*  --  8.6*  AST 19  --   --   ALT 24  --   --   ALKPHOS 64  --   --   BILITOT 0.4  --   --    ------------------------------------------------------------------------------------------------------------------ estimated creatinine clearance is 31.6 mL/min (by C-G  formula based on Cr of 2.3). ------------------------------------------------------------------------------------------------------------------ No results for input(s): HGBA1C in the last 72 hours. ------------------------------------------------------------------------------------------------------------------ No results for input(s): CHOL, HDL, LDLCALC, TRIG, CHOLHDL, LDLDIRECT in the last 72 hours. ------------------------------------------------------------------------------------------------------------------ No results for input(s): TSH, T4TOTAL, T3FREE, THYROIDAB in the last 72 hours.  Invalid input(s): FREET3 ------------------------------------------------------------------------------------------------------------------ No results for input(s): VITAMINB12, FOLATE, FERRITIN, TIBC, IRON, RETICCTPCT in the last 72 hours.  Coagulation profile  Recent Labs Lab 02/19/15 2333  INR 1.26    No results for input(s): DDIMER in the last 72 hours.  Cardiac Enzymes  Recent Labs Lab 02/19/15 2333  02/20/15 0509 02/20/15 1202  TROPONINI 0.05* 0.06* 0.05*   ------------------------------------------------------------------------------------------------------------------ Invalid input(s): POCBNP    Emunah Texidor D.O. on 02/20/2015 at 2:17 PM  Between 7am to 7pm - Pager - [phone]  After 7pm go to [email or web address] - password [email or web address]  And look for the night coverage person covering for me after hours  [facility] Office  [phone]

## 2015-02-20 NOTE — Progress Notes (Addendum)
Nutrition Brief Note  Patient identified on the Malnutrition Screening Tool (MST) Report.  Wt Readings from Last 15 Encounters:  02/20/15 148 lb 2.4 oz (67.2 kg)  02/15/15 150 lb 3.2 oz (68.13 kg)  02/02/15 141 lb 15.6 oz (64.4 kg)  02/01/15 141 lb 8.6 oz (64.2 kg)    Body mass index is 23.92 kg/(m^2). Patient meets criteria for Normal based on current BMI.   Current diet order is Dys 3, thin liquid, patient is consuming approximately 75% of meals at this time. Labs and medications reviewed.   No nutrition interventions warranted at this time. If nutrition issues arise, please consult RD.   Arthur Holms, RD, LDN Pager #: [phone] After-Hours Pager #: [phone]

## 2015-02-20 NOTE — Evaluation (Signed)
Clinical/Bedside Swallow Evaluation Patient Details  Name: Timothy Lambert MRN: [id number] Date of Birth: [birth year]  Today's Date: 02/20/2015 Time: SLP Start Time (ACUTE ONLY): 0956 SLP Stop Time (ACUTE ONLY): 1013 SLP Time Calculation (min) (ACUTE ONLY): 17 min  Past Medical History:  Past Medical History  Diagnosis Date  . Coronary artery disease   . Hypertension   . HLD (hyperlipidemia)   . GERD (gastroesophageal reflux disease)   . Refusal of blood transfusions as patient is Jehovah's Witness   . CHF (congestive heart failure)   . Myocardial infarction 01/22/2015    "massive"  . Type II diabetes mellitus   . Seizures 01/22/2015    "in front of paramedics"  . Stroke 01/22/2015    "short term memory loss & right side weak since" (02/19/2015)  . Acute renal failure (ARF) 02/19/2015   Past Surgical History:  Past Surgical History  Procedure Laterality Date  . Cardiac catheterization N/A 01/23/2015    Procedure: Left Heart Cath and Coronary Angiography;  Surgeon: [name], MD;  Location: [facility] INVASIVE CV LAB CUPID;  Service: Cardiovascular;  Laterality: N/A;  . Cardiac catheterization N/A 01/23/2015    Procedure: Coronary Stent Intervention;  Surgeon: [name], MD;  Location: [facility] INVASIVE CV LAB CUPID;  Service: Cardiovascular;  Laterality: N/A;   HPI:  59 y.o. male with PMH of hypertension, hyperlipidemia, diabetes mellitus, GERD, recent stroke (02/01/15) and STEMI, seizure, systolic congestive heart failure, chronic kidney disease-stage III, hx of alcohol abuse, who presents with generalized weakness and decreased oral intake. Hospitalized 5/13-5/26 due to stroke, spent one week in inpatient CIR. MD notes state history of dysphagia and is on a liquid diet (at home?). CT head negative for acute abnormalities. CXR No edema or consolidation. MBS 5/12 with trace aspiration with thin with inconsistent cough (? due to intubation) with Dys 3, nectar liquids recommended after MBS and at CIR  discharge.   Assessment / Plan / Recommendation Clinical Impression  Pt with history of dysphagia during previous hospitalization 5/13-5/26 with risk factors including stroke and 5 day intubation. MBS 5/12 revealed aspiration of thin with inconsistent cough response and recommended nectar thick liquids in addition to thin liquid trials during inpatient rehab stay. Chest congestion and increased respiratory effort prevented pt from upgrading to thin at that time. Presently, vocal quality has improved compared to rehab notes as well as intensity. Multiple sips hin liquid given today via cup and straw. No cough, wet vocal quality or frequent throat clearing exhibited. Current CXR negative for acute infiltrates and RN documentation of lung sounds has been clear. Will continue Dys diet (due to possible pocketing with left labial weakness) and thin liquids (as initially ordered). Straws allowed and educated pt to remove if difficulties are noted with use, small sips, pills one at a time with liquid. ST will closely follow for safety with recommendations.       Aspiration Risk   (milmod)    Diet Recommendation Dysphagia 3 (Mech soft);Thin   Medication Administration: Whole meds with liquid Compensations: Slow rate;Small sips/bites;Check for pocketing    Other  Recommendations Oral Care Recommendations: Oral care BID   Follow Up Recommendations       Frequency and Duration min 2x/week  2 weeks   Pertinent Vitals/Pain none         Swallow Study Prior Functional Status  Type of Home: House  Lives With: Spouse Available Help at Discharge: Family;Friend(s);Available 24 hours/day       Oral/Motor/Sensory Function Overall Oral  Motor/Sensory Function: Impaired at baseline (mild left labial weakness)   Ice Chips Ice chips: Not tested   Thin Liquid Thin Liquid: Within functional limits Presentation: Cup;Straw Pharyngeal  Phase Impairments:  (none)    Nectar Thick Nectar Thick Liquid: Not  tested   Honey Thick Honey Thick Liquid: Not tested   Puree Puree: Not tested   Solid   GO    Solid: Within functional limits       Houston Siren 02/20/2015,10:35 AM  Orbie Pyo Colvin Caroli.Ed [employer] [phone]

## 2015-02-20 NOTE — Progress Notes (Signed)
OT Cancellation Note  Patient Details Name: Timothy Lambert MRN: [id number] DOB: [birth year]   Cancelled Treatment:    Reason Eval/Treat Not Completed: Other (comment). Troponin levels trending up. Will hold for now.  Benito Mccreedy OTR/L [id number] 02/20/2015, 8:28 AM

## 2015-02-21 DIAGNOSIS — I6340 Cerebral infarction due to embolism of unspecified cerebral artery: Secondary | ICD-10-CM

## 2015-02-21 LAB — BASIC METABOLIC PANEL
ANION GAP: 8 (ref 5–15)
BUN: 25 mg/dL — AB (ref 6–20)
CHLORIDE: 108 mmol/L (ref 101–111)
CO2: 23 mmol/L (ref 22–32)
CREATININE: 1.53 mg/dL — AB (ref 0.61–1.24)
Calcium: 8.5 mg/dL — ABNORMAL LOW (ref 8.9–10.3)
GFR, EST AFRICAN AMERICAN: 56 mL/min — AB (ref >60)
GFR, EST NON AFRICAN AMERICAN: 48 mL/min — AB (ref >60)
Glucose, Bld: 114 mg/dL — ABNORMAL HIGH (ref 65–99)
Potassium: 4.9 mmol/L (ref 3.5–5.1)
Sodium: 139 mmol/L (ref 135–145)

## 2015-02-21 LAB — GLUCOSE, CAPILLARY
Glucose-Capillary: 108 mg/dL — ABNORMAL HIGH (ref 65–99)
Glucose-Capillary: 206 mg/dL — ABNORMAL HIGH (ref 65–99)

## 2015-02-21 LAB — TSH: TSH: 3.562 u[IU]/mL (ref 0.350–4.500)

## 2015-02-21 MED ORDER — LISINOPRIL 10 MG PO TABS: 5.0000 mg | ORAL_TABLET | Freq: Every day | ORAL | Status: DC

## 2015-02-21 MED ORDER — ENSURE ENLIVE PO LIQD
237.0000 mL | Freq: Two times a day (BID) | ORAL | Status: DC
Start: 2015-02-21 — End: 2015-03-27

## 2015-02-21 NOTE — Progress Notes (Signed)
Patient discharge teaching given, including activity, diet, follow-up appoints, and medications. Patient verbalized understanding of all discharge instructions. IV access was d/c'd. Vitals are stable. Skin is intact except as charted in most recent assessments. Pt to be escorted out by volunteer services, to be driven home by family.  Aiana Nordquist Therapist, sports

## 2015-02-21 NOTE — Care Management Note (Addendum)
Case Management Note  Patient Details  Name: Timothy Lambert MRN: [id number] Date of Birth: Sep 14, 1956  Subjective/Objective:    Pt admitted for generalized weakness and poor appetitie                Action/Plan:  Pt is already active with [facility] for RN, OT, ST and SW.  CM requested MD to write order for PT recommendation.  CM will continue to monitor for disposition needs   Expected Discharge Date:                  Expected Discharge Plan:  [place]  In-House Referral:     Discharge planning Services  CM Consult  Post Acute Care Choice:    Choice offered to:  Patient  DME Arranged:  3-N-1, Shower stool DME Agency:  [place]:  RN, PT, OT, Speech Therapy (Social Work) [place]:  [place]  Status of Service:  Completed, signed off  Medicare Important Message Given:  No Date Medicare IM Given:    Medicare IM give by:    Date Additional Medicare IM Given:    Additional Medicare Important Message give by:     If discussed at [place] of Stay Meetings, dates discussed:    Disposition Plan:  Home with [place]  Additional Comments:  02/21/15 Elenor Quinones, RN, BSN [phone].  Pt is already active with [facility], resumption of services order has been written.  CM contacted [facility]  Care to request [facility] PT as well previous services, referral was accepted.  CM contacted [place] [facility] ; referral was accepted. Both services are aware that pt has medicaid pending.   Pt already has appointment with [facility] with Dr. Alfredia Ferguson on 02/25/15 at 9:30  as his new PCP, CM contacted clinic and confirmed.  No additional CM needs at this time.  02/20/15 Elenor Quinones, RN, BSN [phone].  CM contacted Gwen with [facility] and informed that pt is admitted.     Maryclare Labrador, RN 02/21/2015, 11:13 AM

## 2015-02-21 NOTE — Discharge Summary (Signed)
Physician Discharge Summary  Timothy Lambert [id number] DOB: 1956-06-16 DOA: 02/19/2015  PCP: Sharon Seller, NP  Admit date: 02/19/2015 Discharge date: 02/21/2015  Time spent: 45 minutes  Recommendations for Outpatient Follow-up:  Patient will be discharged to home with home health services.  Patient will need to follow up with primary care provider within one week of discharge, and have repeat BMP at that time. Patient should also discuss continuation of lisinopril. Patient should also follow-up with cardiology at his scheduled appointment.  Patient should continue medications as prescribed.  Patient should follow a soft/dysphagia diet.   Discharge Diagnoses:  Generalized weakness Hypokalemia Acute on chronic kidney disease, stage III CAD Hyperlipidemia Seizure Recent history of stroke Diabetes mellitus type 2 Combined systolic and diastolic heart failure, chronic  Discharge Condition: Stable  Diet recommendation: Dysphagai 3  Filed Weights   02/19/15 2354 02/20/15 0421 02/21/15 0546  Weight: 67.2 kg (148 lb 2.4 oz) 67.2 kg (148 lb 2.4 oz) 68.357 kg (150 lb 11.2 oz)    History of present illness:  on 02/19/2015 by Dr. Otila Kluver is a 59 y.o. male with PMH of hypertension, hyperlipidemia, diabetes mellitus, GERD, recent stroke and STEMI, seizure, systolic congestive heart failure, chronic kidney disease-stage III, hx of alcohol abuse, who presents with generalized weakness. Patient was recently hospitalized from 5/13 to 02/14/15 because of acute stroke and STEMI. Patient is s/p of stent placement, and was discharged on Brilinta. He spent over one week in rehabilitation before he was discharged home in the care of his wife. Patient reports that he has generalized weakness and decreased oral intake. He feels very weak and had difficulty standing or walking without significant assistance from the wife. Because of recent stroke, and he has dysphasia. He is on liquid  diet. He thinks he is not taking enough IV fluids. He does not have pain anywhere, no fever, chills or shortness of breath. Pt reports that he has not had any alcohol recently and is doing fine without drinking alcohol. Currently patient denies fever, chills, running nose, ear pain, headaches, cough, chest pain, SOB, abdominal pain, diarrhea, constipation, dysuria, urgency, frequency, hematuria, skin rashes or leg swelling. No new unilateral weakness, numbness or tingling sensations. No vision change or hearing loss. In ED, patient was found to have negative CT head for acute abnormalities. Potassium 6.1 without EKG T wave changes, AoCKD-III, initial troponin negative, BNP 77, lactate 1.65, temperature normal, no tachycardia, negative alcohol level, chest x-ray negative for acute abnormalities. Patient is admitted to inpatient for further evaluation and treatment.  Hospital Course:  Generalized weakness -It is most likely due to deconditioning secondary to multiple comorbidities, including dehydration, new worsening renal function and electrolyte disturbance.  -Patient feels improved today -unlikely due to infection, patient is afebrile, no coughing or SOB, no complaints of urinary symptoms -PT/OT recommended home health -TSH 3.562  Hyperkalemia -Upon admission, potassium 6.1  -Treated with insulin, bicarbonate, and Kayexalate -Potassium currently 4.8  Acute on chronic kidney disease, stage 3 -Previous Cre is 1.24 on 02/11/15, his Cre is 3.00 on admission.  -Likely due to prerenal secondary to dehydration and continuation of ACEI. -Hold lisinopril- may continue upon discharge (recent stent), would have repeat BMP in 1 week. -Creatinine slightly improved, currently 1.53  CAD -recent STEMI, s/p of stent. On brilinta now.  -Currently no chest pain -Troponin cycled and currently 0.05 -continue brilinta and ASA, coreg  Hyperlipidemia -LDL was 70 (01/26/2015) -Continue  Lipitor  Seizure -Stable, continue Keppra  Recent hx of stroke  -no new issues. CT head is negative for acute abnormalities. -Continue aspirin and Lipitor -Dysphagia diet   DM-II -Last A1c 9.0 on 02/15/15. Patient is taking Lantus 24 units daily at home -Continue home medications  Chronic Combined systolic and diastolic congestive heart failure -2-D echo 01/24/15 showed EF 30-35% with grade 1 diastolic dysfunction.  -CHF is compensated on admission.  -Patient does not have any leg edema. -BNP 77. -Monitor intake/output, weights  Procedures  None  Consults  None  Discharge Exam: Filed Vitals:   02/21/15 0546  BP: 127/63  Pulse:   Temp: 98.3 F (36.8 C)  Resp: 18   Exam  General: Well developed, no distress  HEENT: NCAT,mucous membranes moist.   Cardiovascular: S1 S2 auscultated, RRR  Respiratory: Clear to auscultation   Abdomen: Soft, nontender, nondistended, + bowel sounds  Extremities: warm dry without cyanosis clubbing or edema  Neuro: AAOx3, nonfocal  Psych: Normal affect and demeanor, pleasant  Discharge Instructions      Discharge Instructions    Discharge instructions    Complete by:  As directed   Patient will be discharged to home with home health services.  Patient will need to follow up with primary care provider within one week of discharge, and have repeat BMP at that time. Patient should also discuss continuation of lisinopril. Patient should also follow-up with cardiology at his scheduled appointment.  Patient should continue medications as prescribed.  Patient should follow a soft/dysphagia diet.            Medication List    STOP taking these medications        sulfamethoxazole-trimethoprim 800-160 MG per tablet  Commonly known as:  BACTRIM DS,SEPTRA DS      TAKE these medications        acetaminophen 325 MG tablet  Commonly known as:  TYLENOL  Take 2 tablets (650 mg total) by mouth every 4 (four) hours as needed for  mild pain (temp > 101.5).     aspirin 81 MG chewable tablet  Chew 1 tablet (81 mg total) by mouth daily.     atorvastatin 80 MG tablet  Commonly known as:  LIPITOR  Take 1 tablet (80 mg total) by mouth daily at 6 PM.     carvedilol 25 MG tablet  Commonly known as:  COREG  Take 1 tablet (25 mg total) by mouth 2 (two) times daily with a meal.     docusate 50 MG/5ML liquid  Commonly known as:  COLACE  Take 10 mLs (100 mg total) by mouth 2 (two) times daily as needed for mild constipation.     famotidine 20 MG tablet  Commonly known as:  PEPCID  Take 1 tablet (20 mg total) by mouth 2 (two) times daily.     feeding supplement (ENSURE ENLIVE) Liqd  Take 237 mLs by mouth 2 (two) times daily between meals.     folic acid 1 MG tablet  Commonly known as:  FOLVITE  Take 1 tablet (1 mg total) by mouth daily.     insulin glargine 100 UNIT/ML injection  Commonly known as:  LANTUS  Inject 0.24 mLs (24 Units total) into the skin daily.     levETIRAcetam 100 MG/ML solution  Commonly known as:  KEPPRA  Take 5 mLs (500 mg total) by mouth every 12 (twelve) hours.     lisinopril 10 MG tablet  Commonly known as:  PRINIVIL,ZESTRIL  Take 0.5 tablets (5 mg total) by mouth  daily.     ticagrelor 90 MG Tabs tablet  Commonly known as:  BRILINTA  Take 1 tablet (90 mg total) by mouth 2 (two) times daily.       No Known Allergies    The results of significant diagnostics from this hospitalization (including imaging, microbiology, ancillary and laboratory) are listed below for reference.    Significant Diagnostic Studies: Ct Head Wo Contrast  02/19/2015   CLINICAL DATA:  Altered mental status and fatigue  EXAM: CT HEAD WITHOUT CONTRAST  TECHNIQUE: Contiguous axial images were obtained from the base of the skull through the vertex without intravenous contrast.  COMPARISON:  Head CT Jan 23, 2015; brain MRI Jan 24, 2015  FINDINGS: Mild diffuse atrophy is stable. There is no intracranial mass,  hemorrhage, extra-axial fluid collection, or midline shift. There is patchy small vessel disease in the centra semiovale bilaterally, stable. The small lacunar infarcts noted on the recent prior MR are not well seen on CT examination. No well-defined acute infarct is appreciable currently on this CT examination. The bony calvarium appears intact. The mastoid air cells are clear.  IMPRESSION: Atrophy with periventricular small vessel disease. The small acute infarcts noted on recent MR are not appreciable by CT. No new gray-white compartment lesion is seen by CT. No hemorrhage or mass effect.   Electronically Signed   By: Lowella Grip III M.D.   On: 02/19/2015 19:20   Ct Head Wo Contrast  01/23/2015   CLINICAL DATA:  Altered mental status  EXAM: CT HEAD WITHOUT CONTRAST  TECHNIQUE: Contiguous axial images were obtained from the base of the skull through the vertex without intravenous contrast.  COMPARISON:  None.  FINDINGS: There is no intracranial hemorrhage, mass or evidence of acute infarction. There is moderate generalized atrophy. There is mild chronic microvascular ischemic change. There is no significant extra-axial fluid collection.  No acute intracranial findings are evident.  IMPRESSION: Moderate chronic atrophy and mild chronic small vessel changes. No acute intracranial findings. Mild motion degradation of the images.   Electronically Signed   By: Andreas Newport M.D.   On: 01/23/2015 00:33   Mr Brain Wo Contrast  01/24/2015   CLINICAL DATA:  Altered mental status, fell out of bed at 8:30 p.m., diaphoretic, witnessed seizure with incontinence. Possible alcohol withdrawal.  EXAM: MRI HEAD WITHOUT CONTRAST  TECHNIQUE: Multiplanar, multiecho pulse sequences of the brain and surrounding structures were obtained without intravenous contrast.  COMPARISON:  CT of the head Jan 23, 2015  FINDINGS: Multiple small foci of reduced diffusion and bifrontal lobes, bilateral occipital lobes, RIGHT parietal lobe  and, RIGHT cerebellum, measuring up to 12 mm and RIGHT occipital lobe. Corresponding low ADC values. No susceptibility artifact to suggest hemorrhage. Ventricles and sulci are upper limits of normal in size for patient's age. Patchy white matter T2 hyperintensities noted, exclusive of the aforementioned abnormality.  No abnormal extra-axial fluid collections. Major intracranial vascular flow voids seen at the skull base. Imaged ocular globes and orbital contents are unremarkable. Mild paranasal sinus mucosal thickening without air-fluid levels. Mild bilateral mastoid effusions. No abnormal sellar expansion. No cerebellar tonsillar ectopia. No abnormal calvarial bone marrow signal. Patient is edentulous.  IMPRESSION: Multiple small foci of acute ischemia spanning multiple vascular territories (involving the cerebrum and cerebellum) most consistent with embolic event.  Parenchymal brain volume loss, upper limits of normal for age. Minimal white matter changes suggest chronic small vessel ischemic disease.   Electronically Signed   By: Elon Alas  On: 01/24/2015 03:51   Dg Chest Port 1 View  02/19/2015   CLINICAL DATA:  Generalized weakness and fatigue  EXAM: PORTABLE CHEST - 1 VIEW  COMPARISON:  Jan 28, 2015  FINDINGS: There are overlying monitor leads. There is no edema or consolidation. Heart size and pulmonary vascularity are normal. No adenopathy. No bone lesions.  IMPRESSION: No edema or consolidation.   Electronically Signed   By: Lowella Grip III M.D.   On: 02/19/2015 19:11   Dg Chest Port 1 View  01/28/2015   CLINICAL DATA:  Respiratory failure.  EXAM: PORTABLE CHEST - 1 VIEW  COMPARISON:  None.  FINDINGS: Endotracheal tube and NG tube in stable position. Right IJ line stable position. Mediastinum and hilar structures are normal. Stable cardiomegaly. Stable bibasilar subsegmental atelectasis and/or infiltrates. No pleural effusion or pneumothorax  IMPRESSION: 1. Lines and tubes in stable  position. 2. Stable bibasilar atelectasis and/or infiltrates.   Electronically Signed   By: [place]   On: 01/28/2015 07:07   Dg Chest Port 1 View  01/27/2015   CLINICAL DATA:  Respiratory failure. Intubated patient. History of coronary artery disease, hypertension and diabetes.  EXAM: PORTABLE CHEST - 1 VIEW  COMPARISON:  01/26/2015  FINDINGS: Endotracheal tube tip projects 2 cm above the carina. Nasogastric tube and right internal jugular central venous line are stable and well positioned. Mild hazy opacity in the medial left lung base appears mildly improved. This could reflect pneumonia or atelectasis. Remainder of the lungs is clear. No pneumothorax.  IMPRESSION: 1. Mild improvement in left lung base opacity which may reflect improved pneumonia or improved atelectasis. No new lung abnormalities. 2. Support apparatus is well positioned.   Electronically Signed   By: Lajean Manes M.D.   On: 01/27/2015 07:45   Dg Chest Port 1 View  01/26/2015   CLINICAL DATA:  Acute respiratory failure  EXAM: PORTABLE CHEST - 1 VIEW  COMPARISON:  01/25/2015  FINDINGS: The endotracheal tube tip is 3.7 cm above the carina. The nasogastric tube extends into the stomach. Mild airspace opacity persists in the left base without significant interval change. There is no pneumothorax.  IMPRESSION: Support equipment appears satisfactorily positioned.  No significant interval change in the bilateral airspace opacities.   Electronically Signed   By: Andreas Newport M.D.   On: 01/26/2015 06:07   Dg Chest Port 1 View  01/25/2015   CLINICAL DATA:  Respiratory failure  EXAM: PORTABLE CHEST - 1 VIEW  COMPARISON:  01/24/2015  FINDINGS: The endotracheal tube tip is 3.4 cm above the carina. The right jugular central line extends into the SVC just below the azygos vein junction. The nasogastric tube extends into the stomach. There is improvement, with partial clearance of central and basilar airspace opacities. Mild consolidation  persists in the left base. There is no pneumothorax.  IMPRESSION: Support equipment appears satisfactorily positioned.  Improved, with partial clearance of central and basilar airspace opacities. This probably represents resolving pulmonary edema.   Electronically Signed   By: Andreas Newport M.D.   On: 01/25/2015 06:12   Dg Chest Port 1 View  01/24/2015   CLINICAL DATA:  Endotracheal tube placement  EXAM: PORTABLE CHEST - 1 VIEW  COMPARISON:  01/23/2015  FINDINGS: Cardiac shadow is mildly enlarged but stable. A nasogastric catheter is been removed in the interval. A right central venous line is seen in the mid superior vena cava. Endotracheal tube is noted 2.4 cm above the carina. Diffuse increased density noted throughout  both lungs similar to that seen on the prior exam consistent with a degree of pulmonary edema.  IMPRESSION: Stable pulmonary edema.  Tubes and lines as described.   Electronically Signed   By: Inez Catalina M.D.   On: 01/24/2015 15:17   Dg Chest Port 1 View  01/23/2015   CLINICAL DATA:  Central line placement and gastric tube placement  EXAM: PORTABLE CHEST - 1 VIEW  COMPARISON:  None.  FINDINGS: The endotracheal tube tip is 3.5 cm above the carina. The gastric tube reaches the stomach but the side port is at or above the EG junction. The tube should be advanced for optimal placement. The right jugular central line extends into the SVC.  There is no pneumothorax. There is mild central ground-glass opacity without dense focal airspace consolidation. There is no large effusion.  IMPRESSION: Satisfactory ET tube position. Gastric tube reaches the stomach but should be advanced for optimal placement. Central line is satisfactorily positioned. No pneumothorax.   Electronically Signed   By: Andreas Newport M.D.   On: 01/23/2015 05:31   Dg Chest Port 1 View  01/23/2015   CLINICAL DATA:  Seizure  EXAM: PORTABLE CHEST - 1 VIEW  COMPARISON:  None.  FINDINGS: Endotracheal tube 2.8 cm from carina.  Normal cardiac silhouette. There is a left lower lobe opacity posterior to the heart. No pleural fluid. No pneumothorax. No overt pulmonary edema.  IMPRESSION: 1. Endotracheal tube in good position. 2. Left lower lobe atelectasis versus infiltrate.   Electronically Signed   By: Suzy Bouchard M.D.   On: 01/23/2015 00:02   Dg Abd Portable 1v  01/30/2015   CLINICAL DATA:  Nasogastric tube placement  EXAM: PORTABLE ABDOMEN - 1 VIEW  COMPARISON:  01/29/2015  FINDINGS: The enteric tube extends into the stomach with tip over the region of the proximal antrum.  IMPRESSION: Enteric tube extends into the stomach with tip in the proximal antrum   Electronically Signed   By: Andreas Newport M.D.   On: 01/30/2015 02:20   Dg Abd Portable 1v  01/29/2015   CLINICAL DATA:  Evaluate feeding tube placement.  EXAM: PORTABLE ABDOMEN - 1 VIEW  COMPARISON:  01/24/2015  FINDINGS: Small bore feeding tube is noted with tip in the peripyloric region.  The bowel gas pattern is unremarkable.  IMPRESSION: Feeding tube tip overlying the peripyloric region.   Electronically Signed   By: Margarette Canada M.D.   On: 01/29/2015 13:19   Dg Abd Portable 1v  01/24/2015   CLINICAL DATA:  NG tube placement  EXAM: PORTABLE ABDOMEN - 1 VIEW  COMPARISON:  Prior film same day  FINDINGS: There is NG tube with tip in left upper quadrant medially probable within proximal stomach. The tip of the NG tube is coilled.  IMPRESSION: NG tube with tip coiled in left upper quadrant medially probable within proximal stomach.   Electronically Signed   By: Lahoma Crocker M.D.   On: 01/24/2015 21:25   Dg Abd Portable 1v  01/24/2015   CLINICAL DATA:  Orogastric tube placement  EXAM: PORTABLE ABDOMEN - 1 VIEW  COMPARISON:  01/24/2015 3:08 p.m.  FINDINGS: Endotracheal tube and right neck catheter are in stable position. There is a new orogastric tube which is coiled in the distal esophagus with tip still at the level of the lower esophagus.  The upper abdominal bowel gas  pattern is nonobstructive. Haziness of the lower chest, with further evaluation limited by extensive respiratory motion.  These results will  be called to the ordering clinician or representative by the Radiologist Assistant, and communication documented in the PACS or zVision Dashboard.  IMPRESSION: The new orogastric tube is coiled in the distal esophagus. Recommend repositioning.   Electronically Signed   By: Monte Fantasia M.D.   On: 01/24/2015 19:19   Dg Swallowing Func-speech Pathology  01/31/2015    Objective Swallowing Evaluation:    Patient Details  Name: Timothy Lambert MRN: [id number] Date of Birth: [birth year]  Today's Date: 01/31/2015 Time: SLP Start Time (ACUTE ONLY): 1005-SLP Stop Time (ACUTE ONLY): 1026 SLP Time Calculation (min) (ACUTE ONLY): 21 min  Past Medical History:  Past Medical History  Diagnosis Date  . Coronary artery disease   . Hypertension   . Diabetes mellitus without complication    Past Surgical History:  Past Surgical History  Procedure Laterality Date  . Cardiac catheterization N/A 01/23/2015    Procedure: Left Heart Cath and Coronary Angiography;  Surgeon: [name], MD;  Location: [facility] INVASIVE CV LAB CUPID;  Service: Cardiovascular;   Laterality: N/A;  . Cardiac catheterization N/A 01/23/2015    Procedure: Coronary Stent Intervention;  Surgeon: [name], MD;   Location: [facility] INVASIVE CV LAB CUPID;  Service: Cardiovascular;  Laterality:  N/A;   HPI:  Other Pertinent Information: 59 y.o. M brought to [facility] ED 5/4 with  seizures. In ED, was altered and extremely agitated requiring intubation  for airway protection . EKG with STEMI. Taken urgently to cath and PTCI  to LAD. Extubated 5/6 with reintubation, extubated 5/9.  Pt has been NPO  since extubation due to acute reversible dysphagia.  Has pulled NG  multiple times.    No Data Recorded  Assessment / Plan / Recommendation CHL IP CLINICAL IMPRESSIONS 01/31/2015  Therapy Diagnosis Mild pharyngeal phase dysphagia  Clinical  Impression Pt presents with a mild pharyngeal dysphagia c/b trace  aspiration of thin liquids with inconsistent cough response; mild  pharyngeal residue of solids post-swallow; delayed initiation.  Aspiration  likely secondary to continued impaired glottal closure s/p intubation.  Pt  is safe to initiate a dysphagia 3 diet with nectar-thick liquids; meds  crushed in puree.  He will require full supervision with meals due to  encephalopathy (impulsivity, poor self-monitoring of rate/bolus size).   Wife present for study and participated in education re: recommendations,  precautions.  SLP will continue to follow.       CHL IP TREATMENT RECOMMENDATION 01/31/2015  Treatment Recommendations Therapy as outlined in treatment plan below     CHL IP DIET RECOMMENDATION 01/31/2015  SLP Diet Recommendations Dysphagia 3 (Mech soft);Nectar  Liquid Administration via (None)  Medication Administration Crushed with puree  Compensations Slow rate;Small sips/bites  Postural Changes and/or Swallow Maneuvers (None)     CHL IP OTHER RECOMMENDATIONS 01/31/2015  Recommended Consults (None)  Oral Care Recommendations Oral care BID  Other Recommendations Order thickener from pharmacy     CHL IP FOLLOW UP RECOMMENDATIONS 01/31/2015  Follow up Recommendations Inpatient Rehab     CHL IP FREQUENCY AND DURATION 01/31/2015  Speech Therapy Frequency (ACUTE ONLY) (None)  Treatment Duration 1 week     SLP Swallow Goals No flowsheet data found.  No flowsheet data found.    CHL IP REASON FOR REFERRAL 01/31/2015  Reason for Referral Objectively evaluate swallowing function     CHL IP ORAL PHASE 01/31/2015  Lips (None)  Tongue (None)  Mucous membranes (None)  Nutritional status (None)  Other (None)  Oxygen therapy (None)  Oral  Phase WFL  Oral - Pudding Teaspoon (None)  Oral - Pudding Cup (None)  Oral - Honey Teaspoon (None)  Oral - Honey Cup (None)  Oral - Honey Syringe (None)  Oral - Nectar Teaspoon (None)  Oral - Nectar Cup (None)  Oral - Nectar Straw (None)   Oral - Nectar Syringe (None)  Oral - Ice Chips (None)  Oral - Thin Teaspoon (None)  Oral - Thin Cup (None)  Oral - Thin Straw (None)  Oral - Thin Syringe (None)  Oral - Puree (None)  Oral - Mechanical Soft (None)  Oral - Regular (None)  Oral - Multi-consistency (None)  Oral - Pill (None)  Oral Phase - Comment (None)      CHL IP PHARYNGEAL PHASE 01/31/2015  Pharyngeal Phase Impaired  Pharyngeal - Pudding Teaspoon (None)  Penetration/Aspiration details (pudding teaspoon) (None)  Pharyngeal - Pudding Cup (None)  Penetration/Aspiration details (pudding cup) (None)  Pharyngeal - Honey Teaspoon (None)  Penetration/Aspiration details (honey teaspoon) (None)  Pharyngeal - Honey Cup (None)  Penetration/Aspiration details (honey cup) (None)  Pharyngeal - Honey Syringe (None)  Penetration/Aspiration details (honey syringe) (None)  Pharyngeal - Nectar Teaspoon (None)  Penetration/Aspiration details (nectar teaspoon) (None)  Pharyngeal - Nectar Cup (None)  Penetration/Aspiration details (nectar cup) (None)  Pharyngeal - Nectar Straw (None)  Penetration/Aspiration details (nectar straw) (None)  Pharyngeal - Nectar Syringe (None)  Penetration/Aspiration details (nectar syringe) (None)  Pharyngeal - Ice Chips (None)  Penetration/Aspiration details (ice chips) (None)  Pharyngeal - Thin Teaspoon (None)  Penetration/Aspiration details (thin teaspoon) (None)  Pharyngeal - Thin Cup (None)  Penetration/Aspiration details (thin cup) (None)  Pharyngeal - Thin Straw (None)  Penetration/Aspiration details (thin straw) (None)  Pharyngeal - Thin Syringe (None)  Penetration/Aspiration details (thin syringe') (None)  Pharyngeal - Puree (None)  Penetration/Aspiration details (puree) (None)  Pharyngeal - Mechanical Soft (None)  Penetration/Aspiration details (mechanical soft) (None)  Pharyngeal - Regular (None)  Penetration/Aspiration details (regular) (None)  Pharyngeal - Multi-consistency (None)  Penetration/Aspiration details  (multi-consistency) (None)  Pharyngeal - Pill (None)  Penetration/Aspiration details (pill) (None)  Pharyngeal Comment (None)      No flowsheet data found.  No flowsheet data found.        Amanda L. Tivis Ringer, [place] CCC/SLP Pager [phone]  [name] 01/31/2015, 10:47 AM     Microbiology: No results found for this or any previous visit (from the past 240 hour(s)).   Labs: Basic Metabolic Panel:  Recent Labs Lab 02/19/15 2020 02/19/15 2031 02/20/15 0509 02/21/15 0546  NA 134* 135 138 139  K 6.1* 6.1* 4.8 4.9  CL 104 106 105 108  CO2 22  --  23 23  GLUCOSE 137* 134* 65 114*  BUN 44* 42* 37* 25*  CREATININE 2.94* 3.00* 2.30* 1.53*  CALCIUM 8.7*  --  8.6* 8.5*   Liver Function Tests:  Recent Labs Lab 02/19/15 2020  AST 19  ALT 24  ALKPHOS 64  BILITOT 0.4  PROT 6.4*  ALBUMIN 3.1*   No results for input(s): LIPASE, AMYLASE in the last 168 hours. No results for input(s): AMMONIA in the last 168 hours. CBC:  Recent Labs Lab 02/19/15 1835 02/19/15 2031 02/19/15 2333 02/20/15 0509  WBC 6.6  --  6.6 7.2  NEUTROABS 4.3  --  3.8  --   HGB 11.0* 9.5* 9.8* 9.3*  HCT 31.9* 28.0* 27.7* 26.7*  MCV 86.4  --  86.0 86.1  PLT 307  --  252 249   Cardiac Enzymes:  Recent Labs  Lab 02/19/15 2333 02/20/15 0509 02/20/15 1202  TROPONINI 0.05* 0.06* 0.05*   BNP: BNP (last 3 results)  Recent Labs  02/19/15 1835 02/20/15 0509  BNP 77.0 113.3*    ProBNP (last 3 results) No results for input(s): PROBNP in the last 8760 hours.  CBG:  Recent Labs Lab 02/20/15 0645 02/20/15 1114 02/20/15 1631 02/20/15 2055 02/21/15 0710  GLUCAP 82 142* 206* 225* 108*       Signed:  Wanza Szumski  [facility] 02/21/2015, 10:18 AM

## 2015-02-25 ENCOUNTER — Encounter: Payer: Self-pay | Admitting: Family Medicine

## 2015-02-25 ENCOUNTER — Ambulatory Visit: Payer: Medicaid Other | Attending: Family Medicine | Admitting: Family Medicine

## 2015-02-25 VITALS — BP 128/72 | HR 80 | Temp 98.0°F | Resp 18 | Ht 66.0 in | Wt 153.0 lb

## 2015-02-25 DIAGNOSIS — E785 Hyperlipidemia, unspecified: Secondary | ICD-10-CM | POA: Insufficient documentation

## 2015-02-25 DIAGNOSIS — I5022 Chronic systolic (congestive) heart failure: Secondary | ICD-10-CM | POA: Diagnosis not present

## 2015-02-25 DIAGNOSIS — E1122 Type 2 diabetes mellitus with diabetic chronic kidney disease: Secondary | ICD-10-CM | POA: Insufficient documentation

## 2015-02-25 DIAGNOSIS — I5040 Unspecified combined systolic (congestive) and diastolic (congestive) heart failure: Secondary | ICD-10-CM

## 2015-02-25 DIAGNOSIS — R569 Unspecified convulsions: Secondary | ICD-10-CM | POA: Diagnosis not present

## 2015-02-25 DIAGNOSIS — R05 Cough: Secondary | ICD-10-CM | POA: Insufficient documentation

## 2015-02-25 DIAGNOSIS — K5900 Constipation, unspecified: Secondary | ICD-10-CM | POA: Insufficient documentation

## 2015-02-25 DIAGNOSIS — Z87891 Personal history of nicotine dependence: Secondary | ICD-10-CM | POA: Insufficient documentation

## 2015-02-25 DIAGNOSIS — I69398 Other sequelae of cerebral infarction: Secondary | ICD-10-CM | POA: Diagnosis not present

## 2015-02-25 DIAGNOSIS — I252 Old myocardial infarction: Secondary | ICD-10-CM

## 2015-02-25 DIAGNOSIS — R531 Weakness: Secondary | ICD-10-CM | POA: Insufficient documentation

## 2015-02-25 DIAGNOSIS — I129 Hypertensive chronic kidney disease with stage 1 through stage 4 chronic kidney disease, or unspecified chronic kidney disease: Secondary | ICD-10-CM | POA: Diagnosis not present

## 2015-02-25 DIAGNOSIS — N183 Chronic kidney disease, stage 3 unspecified: Secondary | ICD-10-CM

## 2015-02-25 DIAGNOSIS — Z7982 Long term (current) use of aspirin: Secondary | ICD-10-CM | POA: Diagnosis not present

## 2015-02-25 DIAGNOSIS — N179 Acute kidney failure, unspecified: Secondary | ICD-10-CM

## 2015-02-25 DIAGNOSIS — I2510 Atherosclerotic heart disease of native coronary artery without angina pectoris: Secondary | ICD-10-CM | POA: Insufficient documentation

## 2015-02-25 DIAGNOSIS — E1165 Type 2 diabetes mellitus with hyperglycemia: Secondary | ICD-10-CM | POA: Insufficient documentation

## 2015-02-25 DIAGNOSIS — Z9861 Coronary angioplasty status: Secondary | ICD-10-CM | POA: Diagnosis not present

## 2015-02-25 DIAGNOSIS — K5909 Other constipation: Secondary | ICD-10-CM

## 2015-02-25 DIAGNOSIS — I10 Essential (primary) hypertension: Secondary | ICD-10-CM

## 2015-02-25 DIAGNOSIS — I213 ST elevation (STEMI) myocardial infarction of unspecified site: Secondary | ICD-10-CM | POA: Diagnosis not present

## 2015-02-25 DIAGNOSIS — I69351 Hemiplegia and hemiparesis following cerebral infarction affecting right dominant side: Secondary | ICD-10-CM | POA: Diagnosis not present

## 2015-02-25 DIAGNOSIS — IMO0002 Reserved for concepts with insufficient information to code with codable children: Secondary | ICD-10-CM

## 2015-02-25 DIAGNOSIS — R059 Cough, unspecified: Secondary | ICD-10-CM | POA: Insufficient documentation

## 2015-02-25 LAB — BASIC METABOLIC PANEL
BUN: 22 mg/dL (ref 6–23)
CHLORIDE: 104 meq/L (ref 96–112)
CO2: 26 meq/L (ref 19–32)
Calcium: 9.4 mg/dL (ref 8.4–10.5)
Creat: 1.18 mg/dL (ref 0.50–1.35)
Glucose, Bld: 186 mg/dL — ABNORMAL HIGH (ref 70–99)
Potassium: 5.2 mEq/L (ref 3.5–5.3)
SODIUM: 140 meq/L (ref 135–145)

## 2015-02-25 LAB — GLUCOSE, POCT (MANUAL RESULT ENTRY): POC GLUCOSE: 169 mg/dL — AB (ref 70–99)

## 2015-02-25 MED ORDER — LACTULOSE 10 GM/15ML PO SOLN: 10.0000 g | Freq: Three times a day (TID) | ORAL | Status: DC

## 2015-02-25 NOTE — Progress Notes (Signed)
Quick Note:  Labs addressed at office as well as medications and patient was made aware. ______ 

## 2015-02-25 NOTE — Patient Instructions (Signed)
Ischemic Stroke °A stroke (cerebrovascular accident) is the sudden death of brain tissue. It is a medical emergency. A stroke can cause permanent loss of brain function. This can cause problems with different parts of your body. A transient ischemic attack (TIA) is different because it does not cause permanent damage. A TIA is a short-lived problem of poor blood flow affecting a part of the brain. A TIA is also a serious problem because having a TIA greatly increases the chances of having a stroke. When symptoms first develop, you cannot know if the problem might be a stroke or a TIA. °CAUSES  °A stroke is caused by a decrease of oxygen supply to an area of your brain. It is usually the result of a small blood clot or collection of cholesterol or fat (plaque) that blocks blood flow in the brain. A stroke can also be caused by blocked or damaged carotid arteries.  °RISK FACTORS °· High blood pressure (hypertension). °· High cholesterol. °· Diabetes mellitus. °· Heart disease. °· The buildup of plaque in the blood vessels (peripheral artery disease or atherosclerosis). °· The buildup of plaque in the blood vessels providing blood and oxygen to the brain (carotid artery stenosis). °· An abnormal heart rhythm (atrial fibrillation). °· Obesity. °· Smoking. °· Taking oral contraceptives (especially in combination with smoking). °· Physical inactivity. °· A diet high in fats, salt (sodium), and calories. °· Alcohol use. °· Use of illegal drugs (especially cocaine and methamphetamine). °· Being African American. °· Being over the age of 55. °· Family history of stroke. °· Previous history of blood clots, stroke, TIA, or heart attack. °· Sickle cell disease. °SYMPTOMS  °These symptoms usually develop suddenly, or may be newly present upon awakening from sleep: °· Sudden weakness or numbness of the face, arm, or leg, especially on one side of the body. °· Sudden trouble walking or difficulty moving arms or legs. °· Sudden  confusion. °· Sudden personality changes. °· Trouble speaking (aphasia) or understanding. °· Difficulty swallowing. °· Sudden trouble seeing in one or both eyes. °· Double vision. °· Dizziness. °· Loss of balance or coordination. °· Sudden severe headache with no known cause. °· Trouble reading or writing. °DIAGNOSIS  °Your health care provider can often determine the presence or absence of a stroke based on your symptoms, history, and physical exam. Computed tomography (CT) of the brain is usually performed to confirm the stroke, determine causes, and determine stroke severity. Other tests may be done to find the cause of the stroke. These tests may include: °· Electrocardiography. °· Continuous heart monitoring. °· Echocardiography. °· Carotid ultrasonography. °· Magnetic resonance imaging (MRI). °· A scan of the brain circulation. °· Blood tests. °PREVENTION  °The risk of a stroke can be decreased by appropriately treating high blood pressure, high cholesterol, diabetes, heart disease, and obesity and by quitting smoking, limiting alcohol, and staying physically active. °TREATMENT  °Time is of the essence. It is important to seek treatment at the first sign of these symptoms because you may receive a medicine to dissolve the clot (thrombolytic) that cannot be given if too much time has passed since your symptoms began. Even if you do not know when your symptoms began, get treatment as soon as possible as there are other treatment options available including oxygen, intravenous (IV) fluids, and medicines to thin the blood (anticoagulants). Treatment of stroke depends on the duration, severity, and cause of your symptoms. Medicines and dietary changes may be used to address diabetes, high blood   pressure, and other risk factors. Physical, speech, and occupational therapists will assess you and work with you to improve any functions impaired by the stroke. Measures will be taken to prevent short-term and long-term  complications, including infection from breathing foreign material into the lungs (aspiration pneumonia), blood clots in the legs, bedsores, and falls. Rarely, surgery may be needed to remove large blood clots or to open up blocked arteries. °HOME CARE INSTRUCTIONS  °· Take medicines only as directed by your health care provider. Follow the directions carefully. Medicines may be used to control risk factors for a stroke. Be sure you understand all your medicine instructions. °· You may be told to take a medicine to thin the blood, such as aspirin or the anticoagulant warfarin. Warfarin needs to be taken exactly as instructed. °¨ Too much and too little warfarin are both dangerous. Too much warfarin increases the risk of bleeding. Too little warfarin continues to allow the risk for blood clots. While taking warfarin, you will need to have regular blood tests to measure your blood clotting time. These blood tests usually include both the PT and INR tests. The PT and INR results allow your health care provider to adjust your dose of warfarin. The dose can change for many reasons. It is critically important that you take warfarin exactly as prescribed, and that you have your PT and INR levels drawn exactly as directed. °¨ Many foods, especially foods high in vitamin K, can interfere with warfarin and affect the PT and INR results. Foods high in vitamin K include spinach, kale, broccoli, cabbage, collard and turnip greens, brussels sprouts, peas, cauliflower, seaweed, and parsley, as well as beef and pork liver, green tea, and soybean oil. You should eat a consistent amount of foods high in vitamin K. Avoid major changes in your diet, or notify your health care provider before changing your diet. Arrange a visit with a dietitian to answer your questions. °¨ Many medicines can interfere with warfarin and affect the PT and INR results. You must tell your health care provider about any and all medicines you take. This  includes all vitamins and supplements. Be especially cautious with aspirin and anti-inflammatory medicines. Do not take or discontinue any prescribed or over-the-counter medicine except on the advice of your health care provider or pharmacist. °¨ Warfarin can have side effects, such as excessive bruising or bleeding. You will need to hold pressure over cuts for longer than usual. Your health care provider or pharmacist will discuss other potential side effects. °¨ Avoid sports or activities that may cause injury or bleeding. °¨ Be mindful when shaving, flossing your teeth, or handling sharp objects. °¨ Alcohol can change the body's ability to handle warfarin. It is best to avoid alcoholic drinks or consume only very small amounts while taking warfarin. Notify your health care provider if you change your alcohol intake. °¨ Notify your dentist or other health care providers before procedures. °· If swallow studies have determined that your swallowing reflex is present, you should eat healthy foods. Including 5 or more servings of fruits and vegetables a day may reduce the risk of stroke. Foods may need to be a certain consistency (soft or pureed), or small bites may need to be taken in order to avoid aspirating or choking. Certain dietary changes may be advised to address high blood pressure, high cholesterol, diabetes, or obesity. °¨ Food choices that are low in sodium, saturated fat, trans fat, and cholesterol are recommended to manage high blood pressure. °¨   Food choies that are high in fiber, and low in saturated fat, trans fat, and cholesterol may control cholesterol levels. °¨ Controlling carbohydrates and sugar intake is recommended to manage diabetes. °¨ Reducing calorie intake and making food choices that are low in sodium, saturated fat, trans fat, and cholesterol are recommended to manage obesity. °· Maintain a healthy weight. °· Stay physically active. It is recommended that you get at least 30 minutes of  activity on all or most days. °· Do not use any tobacco products including cigarettes, chewing tobacco, or electronic cigarettes. °· Limit alcohol use even if you are not taking warfarin. Moderate alcohol use is considered to be: °¨ No more than 2 drinks each day for men. °¨ No more than 1 drink each day for nonpregnant women. °· Home safety. A safe home environment is important to reduce the risk of falls. Your health care provider may arrange for specialists to evaluate your home. Having grab bars in the bedroom and bathroom is often important. Your health care provider may arrange for equipment to be used at home, such as raised toilets and a seat for the shower. °· Physical, occupational, and speech therapy. Ongoing therapy may be needed to maximize your recovery after a stroke. If you have been advised to use a walker or a cane, use it at all times. Be sure to keep your therapy appointments. °· Follow all instructions for follow-up with your health care provider. This is very important. This includes any referrals, physical therapy, rehabilitation, and lab tests. Proper follow-up can prevent another stroke from occurring. °SEEK MEDICAL CARE IF: °· You have personality changes. °· You have difficulty swallowing. °· You are seeing double. °· You have dizziness. °· You have a fever. °· You have skin breakdown. °SEEK IMMEDIATE MEDICAL CARE IF:  °Any of these symptoms may represent a serious problem that is an emergency. Do not wait to see if the symptoms will go away. Get medical help right away. Call your local emergency services (911 in [place] Do not drive yourself to the hospital. °· You have sudden weakness or numbness of the face, arm, or leg, especially on one side of the body. °· You have sudden trouble walking or difficulty moving arms or legs. °· You have sudden confusion. °· You have trouble speaking (aphasia) or understanding. °· You have sudden trouble seeing in one or both eyes. °· You have a loss of  balance or coordination. °· You have a sudden, severe headache with no known cause. °· You have new chest pain or an irregular heartbeat. °· You have a partial or total loss of consciousness. °Document Released: 09/07/2005 Document Revised: 01/22/2014 Document Reviewed: 04/17/2012 °Exit[employer]® Patient Information ©2015 [employer]. This information is not intended to replace advice given to you by your health care provider. Make sure you discuss any questions you have with your health care provider. ° °

## 2015-02-25 NOTE — Progress Notes (Signed)
Patient has follow up from hospitalization for stroke, heart attack, and seizure.  Patient is wearing a lifevest.  Patient states he is feeling "pretty good", better than before.  Patient has a cough, and denies pain at this time.  Patient has not had a bowel movement since last Thursday.

## 2015-02-25 NOTE — Progress Notes (Addendum)
Subjective:    Patient ID: PARMVEER URBANEK, male    DOB: April 26, 1956, 59 y.o.   MRN: [id number]  HPI  Admit Date: 01/22/15 Discharge Date: 02/21/15  Robby Burroughs is a 59 year old male with a history of hypertension, hyperlipidemia, diabetes mellitus, GERD, recent stroke and STEMI, seizure, systolic congestive heart failure, chronic kidney disease-stage III, hx of alcohol abuse, recently admitted 02/01/15- 02/14/15 due to acute stroke and NSTEMI s/p stent placement who presented to [facility] ED with generalized weakness.  He had complained of decreased oral intake and had been on liquid diet because of dysphagia. He had no fever, chills or abdominal symptoms. He had a CT head which was negative for any acute abnormalities, his potassium was elevated at 6.1, BNP was 77 his troponin were 0.06, 0.05, he had an elevated creatinine of 3.0 which was up from his baseline of 1.4-1.7, chest x-ray was negative and he was admitted for further evaluation. Weakness was attributed to deconditioning, he received IV hydration and lisinopril was held with subsequent improvement in creatinine to 1.53.  His condition improved and he was scheduled to have a repeat basic metabolic panel at his follow-up visit.   Interval history: Was supposed to stop Lisinopril which he never did; states his weakness has improved some. Complains of dry cough when he goes to bed, complains of constipation and has not moved his bowels for the last four days.  Past Medical History  Diagnosis Date  . Coronary artery disease   . Hypertension   . HLD (hyperlipidemia)   . GERD (gastroesophageal reflux disease)   . Refusal of blood transfusions as patient is Jehovah's Witness   . CHF (congestive heart failure)   . Myocardial infarction 01/22/2015    "massive"  . Type II diabetes mellitus   . Seizures 01/22/2015    "in front of paramedics"  . Stroke 01/22/2015    "short term memory loss & right side weak since" (02/19/2015)  . Acute  renal failure (ARF) 02/19/2015    Past Surgical History  Procedure Laterality Date  . Cardiac catheterization N/A 01/23/2015    Procedure: Left Heart Cath and Coronary Angiography;  Surgeon: [name], MD;  Location: [facility] INVASIVE CV LAB CUPID;  Service: Cardiovascular;  Laterality: N/A;  . Cardiac catheterization N/A 01/23/2015    Procedure: Coronary Stent Intervention;  Surgeon: [name], MD;  Location: [facility] INVASIVE CV LAB CUPID;  Service: Cardiovascular;  Laterality: N/A;    Mr. Board had no medications administered during this visit.  Family History  Problem Relation Age of Onset  . Diabetes Father     History   Social History  . Marital Status: Married    Spouse Name: N/A  . Number of Children: N/A  . Years of Education: N/A   Occupational History  . Not on file.   Social History Main Topics  . Smoking status: Former Smoker -- 0.33 packs/day for 14 years    Types: Cigarettes  . Smokeless tobacco: Never Used     Comment: "quit smoking cigarettes in 1992"  . Alcohol Use: No     Comment: 02/19/2015 "stopped drinking 01/22/2015"  . Drug Use: No  . Sexual Activity: Not Currently   Other Topics Concern  . Not on file   Social History Narrative   No Known Allergies    Review of Systems  Constitutional: Negative for activity change and appetite change.  HENT: Negative for sinus pressure and sore throat.   Eyes: Negative for  visual disturbance.  Respiratory: Positive for cough. Negative for chest tightness and shortness of breath.   Cardiovascular: Negative for chest pain and palpitations.  Gastrointestinal: Positive for constipation. Negative for abdominal pain and abdominal distention.  Endocrine: Negative for cold intolerance, heat intolerance and polyphagia.  Genitourinary: Negative for dysuria, frequency and difficulty urinating.  Musculoskeletal: Negative for back pain, joint swelling and arthralgias.  Skin: Negative for color change.  Neurological:  Negative for dizziness, tremors and weakness.  Psychiatric/Behavioral: Negative for suicidal ideas and behavioral problems.         Objective: Filed Vitals:   02/25/15 0923  BP: 128/72  Pulse: 80  Temp: 98 F (36.7 C)  TempSrc: Oral  Resp: 18  Height: 5\' 6"  (1.676 m)  Weight: 153 lb (69.4 kg)  SpO2: 99%      Physical Exam  Constitutional: He is oriented to person, place, and time. He appears well-developed and well-nourished.  HENT:  Head: Normocephalic and atraumatic.  Right Ear: External ear normal.  Left Ear: External ear normal.  Eyes: Conjunctivae and EOM are normal. Pupils are equal, round, and reactive to light.  Neck: Normal range of motion. Neck supple. No tracheal deviation present.  Cardiovascular: Normal rate, regular rhythm and normal heart sounds.   No murmur heard. Wearing a life vest  Pulmonary/Chest: Effort normal and breath sounds normal. No respiratory distress. He has no wheezes. He exhibits no tenderness.  Abdominal: Soft. Bowel sounds are normal. He exhibits no mass. There is no tenderness.  Musculoskeletal: Normal range of motion. He exhibits no edema or tenderness.  Neurological: He is alert and oriented to person, place, and time.  Motor strength: 4+/5 globally  Skin: Skin is warm and dry.  Psychiatric: He has a normal mood and affect.     EXAM: CT HEAD WITHOUT CONTRAST  TECHNIQUE: Contiguous axial images were obtained from the base of the skull through the vertex without intravenous contrast.  COMPARISON: Head CT Jan 23, 2015; brain MRI Jan 24, 2015  FINDINGS: Mild diffuse atrophy is stable. There is no intracranial mass, hemorrhage, extra-axial fluid collection, or midline shift. There is patchy small vessel disease in the centra semiovale bilaterally, stable. The small lacunar infarcts noted on the recent prior MR are not well seen on CT examination. No well-defined acute infarct is appreciable currently on this CT examination. The  bony calvarium appears intact. The mastoid air cells are clear.  IMPRESSION: Atrophy with periventricular small vessel disease. The small acute infarcts noted on recent MR are not appreciable by CT. No new gray-white compartment lesion is seen by CT. No hemorrhage or mass effect.   Electronically Signed  By: Lowella Grip III M.D.  On: 02/19/2015 19:20       Assessment & Plan:  59 year old male with a history of hypertension, hyperlipidemia, diabetes mellitus, GERD, recent stroke and STEMI s/p stent, seizure, congestive heart failure, chronic kidney disease-stage III, hx of alcohol abuse, admitted 02/01/15- 02/14/15 due to acute stroke and NSTEMI s/p stent placement and recently for generalized weakness.  Generalized weakness: Improved Likely secondary to deconditioning; advised to comply with eating breakfast which he has a habit of skipping this could contribute to his weakness.   Cough: Likely ACE inhibitor induced. I have discontinued lisinopril and will reassess at his next visit and if symptoms improved I will substitute with losartan.   Constipation: Commenced on lactulose. Dietary modifications advised.   Seizure: No recent seizures Currently on Keppra   STEMI status post stent placement:  Continue Brilinta.  CHF: Currently wearing a Zoll LifeVest, EF 30-35% Restrict fluids to 2 L per day, daily weights, low-sodium diet.  Type II DM:  Uncontrolled with A1c of 9.0  CBG of 169 indicates some improvement and so I would make no changes to his regimen.  This note has been created with Surveyor, quantity. Any transcriptional errors are unintentional.

## 2015-02-26 ENCOUNTER — Telehealth: Payer: Self-pay

## 2015-02-26 NOTE — Telephone Encounter (Signed)
-----   Message from Arnoldo Morale, MD sent at 02/26/2015  9:19 AM EDT ----- Please inform him renal function has normalized.

## 2015-02-26 NOTE — Telephone Encounter (Signed)
Nurse called patient, reached voicemail. Left message for patient to call Ash Mcelwain at [phone].   

## 2015-02-27 NOTE — Telephone Encounter (Signed)
-----   Message from Arnoldo Morale, MD sent at 02/26/2015  9:19 AM EDT ----- Please inform him renal function has normalized.

## 2015-02-27 NOTE — Telephone Encounter (Signed)
Nurse called patient, patients wife explained patient was there but could barely talk. Nurse explained patient must give nurse permission to give wife results if patient would like wife to talk to nurse concerning results. Patient picked up phone, nurse asked if it was okay to give wife information, patient replied "Yes". Wife verified patients date of birth. Nurse told patient and wife kidney function is normal. Patient and wife voice understanding and have no questions at this time .

## 2015-03-04 ENCOUNTER — Encounter: Payer: Self-pay | Admitting: Family Medicine

## 2015-03-04 ENCOUNTER — Ambulatory Visit: Payer: Medicaid Other | Attending: Family Medicine | Admitting: Family Medicine

## 2015-03-04 VITALS — BP 135/77 | HR 83 | Temp 98.3°F | Resp 18 | Ht 66.0 in | Wt 152.4 lb

## 2015-03-04 DIAGNOSIS — E1165 Type 2 diabetes mellitus with hyperglycemia: Secondary | ICD-10-CM | POA: Insufficient documentation

## 2015-03-04 DIAGNOSIS — Z87891 Personal history of nicotine dependence: Secondary | ICD-10-CM | POA: Insufficient documentation

## 2015-03-04 DIAGNOSIS — I639 Cerebral infarction, unspecified: Secondary | ICD-10-CM

## 2015-03-04 DIAGNOSIS — I6340 Cerebral infarction due to embolism of unspecified cerebral artery: Secondary | ICD-10-CM | POA: Diagnosis not present

## 2015-03-04 DIAGNOSIS — I10 Essential (primary) hypertension: Secondary | ICD-10-CM | POA: Insufficient documentation

## 2015-03-04 DIAGNOSIS — Z794 Long term (current) use of insulin: Secondary | ICD-10-CM | POA: Insufficient documentation

## 2015-03-04 DIAGNOSIS — I252 Old myocardial infarction: Secondary | ICD-10-CM | POA: Insufficient documentation

## 2015-03-04 DIAGNOSIS — I5021 Acute systolic (congestive) heart failure: Secondary | ICD-10-CM | POA: Insufficient documentation

## 2015-03-04 DIAGNOSIS — R569 Unspecified convulsions: Secondary | ICD-10-CM | POA: Diagnosis not present

## 2015-03-04 DIAGNOSIS — IMO0002 Reserved for concepts with insufficient information to code with codable children: Secondary | ICD-10-CM

## 2015-03-04 LAB — GLUCOSE, POCT (MANUAL RESULT ENTRY): POC GLUCOSE: 268 mg/dL — AB (ref 70–99)

## 2015-03-04 MED ORDER — INSULIN GLARGINE 100 UNIT/ML SOLOSTAR PEN: 24.0000 [IU] | PEN_INJECTOR | Freq: Every day | SUBCUTANEOUS | Status: DC

## 2015-03-04 NOTE — Progress Notes (Signed)
Patient here for follow up for DM/stroke.  Patient states he feels  "alright."  Patient has no complaints of pain at this time. BP: 135/77, HR: 83. Patient current CBG 268.  Patient's wife reports CBG 83 this morning, ate sausage and egg biscuit with strawberry jelly and cranberry juice at 7:45am.

## 2015-03-04 NOTE — Telephone Encounter (Signed)
This pt was admitted to the hospital 02/19/15.

## 2015-03-04 NOTE — Progress Notes (Signed)
Subjective:    Patient ID: Timothy Lambert, male    DOB: 1955-10-29, 59 y.o.   MRN: [id number]  HPI  Devone Batt is a 59 year old male with a history of hypertension, hyperlipidemia, diabetes mellitus (A1c 9.0), GERD, recent stroke and STEMI s/p stent, seizure, congestive heart failure, chronic kidney disease-stage III, hx of alcohol abuse, admitted 02/01/15- 02/14/15 due to acute stroke and NSTEMI s/p stent placement and recently for generalized weakness between 5/31-02/21/15  At his last office visit he had complained of dry cough for which his lisinopril was placed on hold and he reports improvement in symptoms and is doing just fine today. He has no complaints today.; Denies dyspnea, chest pains. Reports blood sugars have been in the low 100s fasting. Denies any recent seizures. Currently wearing a Zoll life vest.   Past Medical History  Diagnosis Date  . Coronary artery disease   . Hypertension   . HLD (hyperlipidemia)   . GERD (gastroesophageal reflux disease)   . Refusal of blood transfusions as patient is Jehovah's Witness   . CHF (congestive heart failure)   . Myocardial infarction 01/22/2015    "massive"  . Type II diabetes mellitus   . Seizures 01/22/2015    "in front of paramedics"  . Stroke 01/22/2015    "short term memory loss & right side weak since" (02/19/2015)  . Acute renal failure (ARF) 02/19/2015    Past Surgical History  Procedure Laterality Date  . Cardiac catheterization N/A 01/23/2015    Procedure: Left Heart Cath and Coronary Angiography;  Surgeon: [name], MD;  Location: [facility] INVASIVE CV LAB CUPID;  Service: Cardiovascular;  Laterality: N/A;  . Cardiac catheterization N/A 01/23/2015    Procedure: Coronary Stent Intervention;  Surgeon: [name], MD;  Location: [facility] INVASIVE CV LAB CUPID;  Service: Cardiovascular;  Laterality: N/A;    History   Social History  . Marital Status: Married    Spouse Name: N/A  . Number of Children: N/A  . Years of  Education: N/A   Occupational History  . Not on file.   Social History Main Topics  . Smoking status: Former Smoker -- 0.33 packs/day for 14 years    Types: Cigarettes  . Smokeless tobacco: Never Used     Comment: "quit smoking cigarettes in 1992"  . Alcohol Use: No     Comment: 02/19/2015 "stopped drinking 01/22/2015"  . Drug Use: No  . Sexual Activity: Not Currently   Other Topics Concern  . Not on file   Social History Narrative    No Known Allergies  Current Outpatient Prescriptions on File Prior to Visit  Medication Sig Dispense Refill  . acetaminophen (TYLENOL) 325 MG tablet Take 2 tablets (650 mg total) by mouth every 4 (four) hours as needed for mild pain (temp > 101.5).    Marland Kitchen aspirin 81 MG chewable tablet Chew 1 tablet (81 mg total) by mouth daily.    . carvedilol (COREG) 25 MG tablet Take 1 tablet (25 mg total) by mouth 2 (two) times daily with a meal. 60 tablet 1  . docusate (COLACE) 50 MG/5ML liquid Take 10 mLs (100 mg total) by mouth 2 (two) times daily as needed for mild constipation. 100 mL 0  . famotidine (PEPCID) 20 MG tablet Take 1 tablet (20 mg total) by mouth 2 (two) times daily. 60 tablet 1  . feeding supplement, ENSURE ENLIVE, (ENSURE ENLIVE) LIQD Take 237 mLs by mouth 2 (two) times daily between meals. 237 mL  12  . folic acid (FOLVITE) 1 MG tablet Take 1 tablet (1 mg total) by mouth daily. 30 tablet 1  . lactulose (CHRONULAC) 10 GM/15ML solution Take 15 mLs (10 g total) by mouth 3 (three) times daily. 946 mL 1  . levETIRAcetam (KEPPRA) 100 MG/ML solution Take 5 mLs (500 mg total) by mouth every 12 (twelve) hours. 473 mL 12  . ticagrelor (BRILINTA) 90 MG TABS tablet Take 1 tablet (90 mg total) by mouth 2 (two) times daily. 60 tablet 1  . atorvastatin (LIPITOR) 80 MG tablet Take 1 tablet (80 mg total) by mouth daily at 6 PM. (Patient not taking: Reported on 03/04/2015) 30 tablet 1   No current facility-administered medications on file prior to visit.      Review of Systems  General: negative for fever, weight loss, appetite change Eyes: no visual symptoms. ENT: no ear symptoms, no sinus tenderness, no nasal congestion or sore throat. Neck: no pain  Respiratory: no wheezing, shortness of breath, cough Cardiovascular: no chest pain, no dyspnea on exertion, no pedal edema, no orthopnea. Gastrointestinal: no abdominal pain, no diarrhea, no constipation Genito-Urinary: no urinary frequency, no dysuria, no polyuria. Hematologic: no bruising Endocrine: no cold or heat intolerance Neurological: no headaches, no seizures, no tremors Musculoskeletal: no joint pains, no joint swelling Skin: no pruritus, no rash. Psychological: no depression, no anxiety      Filed Vitals:   03/04/15 0929  BP: 135/77  Pulse: 83  Temp: 98.3 F (36.8 C)  TempSrc: Oral  Resp: 18  Height: 5\' 6"  (1.676 m)  Weight: 152 lb 6.4 oz (69.128 kg)  SpO2: 100%      Physical Exam  Constitutional: normal appearing,  Eyes: PERRLA HEENT: Head is atraumatic, normal sinuses, normal oropharynx, normal appearing tonsils and palate, tympanic membrane is normal bilaterally. Neck: normal range of motion, no thyromegaly, no JVD Cardiovascular: normal rate and rhythm, normal heart sounds, no murmurs, rub or gallop, no pedal edema, wearing a zoll life vest. Respiratory: clear to auscultation bilaterally, no wheezes, no rales, no rhonchi Abdomen: soft, not tender to palpation, normal bowel sounds, no enlarged organs Extremities: Full ROM, no tenderness in joints Skin: warm and dry, no lesions.        Assessment & Plan:  59 year old male with a history of hypertension, hyperlipidemia, diabetes mellitus (A1c 9.0), GERD, recent stroke and STEMI s/p stent, seizure, congestive heart failure, chronic kidney disease-stage III, hx of alcohol abuse, admitted 02/01/15- 02/14/15 due to acute stroke and NSTEMI s/p stent placement.  Type 2 DM: Uncontrolled with A1c of 9.0 but blood  sugar log reveals some improvement. Continue Lantus at current dose of 24 units ADA Diet. Up to date on Pneumovax, foot exam performed today; advised to get in touch.  Stroke: Stable Ambulates via rolling walker.  Seizure: No recent seizures.  STEMI; S/p stent Continue Brilinta  Hypertension: Lisinopril was discontinued due to cough. Would love to place on Losartan for reno- protection but due to hyperkalemia in the past and most recent K of 5.2. Iwill hold off until he had a repeat which is normal at his next visit.

## 2015-03-04 NOTE — Patient Instructions (Signed)
Diabetes Mellitus and Food It is important for you to manage your blood sugar (glucose) level. Your blood glucose level can be greatly affected by what you eat. Eating healthier foods in the appropriate amounts throughout the day at about the same time each day will help you control your blood glucose level. It can also help slow or prevent worsening of your diabetes mellitus. Healthy eating may even help you improve the level of your blood pressure and reach or maintain a healthy weight.  HOW CAN FOOD AFFECT ME? Carbohydrates Carbohydrates affect your blood glucose level more than any other type of food. Your dietitian will help you determine how many carbohydrates to eat at each meal and teach you how to count carbohydrates. Counting carbohydrates is important to keep your blood glucose at a healthy level, especially if you are using insulin or taking certain medicines for diabetes mellitus. Alcohol Alcohol can cause sudden decreases in blood glucose (hypoglycemia), especially if you use insulin or take certain medicines for diabetes mellitus. Hypoglycemia can be a life-threatening condition. Symptoms of hypoglycemia (sleepiness, dizziness, and disorientation) are similar to symptoms of having too much alcohol.  If your health care provider has given you approval to drink alcohol, do so in moderation and use the following guidelines:  Women should not have more than one drink per day, and men should not have more than two drinks per day. One drink is equal to:  12 oz of beer.  5 oz of wine.  1 oz of hard liquor.  Do not drink on an empty stomach.  Keep yourself hydrated. Have water, diet soda, or unsweetened iced tea.  Regular soda, juice, and other mixers might contain a lot of carbohydrates and should be counted. WHAT FOODS ARE NOT RECOMMENDED? As you make food choices, it is important to remember that all foods are not the same. Some foods have fewer nutrients per serving than other  foods, even though they might have the same number of calories or carbohydrates. It is difficult to get your body what it needs when you eat foods with fewer nutrients. Examples of foods that you should avoid that are high in calories and carbohydrates but low in nutrients include:  Trans fats (most processed foods list trans fats on the Nutrition Facts label).  Regular soda.  Juice.  Candy.  Sweets, such as cake, pie, doughnuts, and cookies.  Fried foods. WHAT FOODS CAN I EAT? Have nutrient-rich foods, which will nourish your body and keep you healthy. The food you should eat also will depend on several factors, including:  The calories you need.  The medicines you take.  Your weight.  Your blood glucose level.  Your blood pressure level.  Your cholesterol level. You also should eat a variety of foods, including:  Protein, such as meat, poultry, fish, tofu, nuts, and seeds (lean animal proteins are best).  Fruits.  Vegetables.  Dairy products, such as milk, cheese, and yogurt (low fat is best).  Breads, grains, pasta, cereal, rice, and beans.  Fats such as olive oil, trans fat-free margarine, canola oil, avocado, and olives. DOES EVERYONE WITH DIABETES MELLITUS HAVE THE SAME MEAL PLAN? Because every person with diabetes mellitus is different, there is not one meal plan that works for everyone. It is very important that you meet with a dietitian who will help you create a meal plan that is just right for you. Document Released: 06/04/2005 Document Revised: 09/12/2013 Document Reviewed: 08/04/2013 Exit[employer] Patient Information 2015 [employer]. This   information is not intended to replace advice given to you by your health care provider. Make sure you discuss any questions you have with your health care provider.  

## 2015-03-11 DIAGNOSIS — F1010 Alcohol abuse, uncomplicated: Secondary | ICD-10-CM

## 2015-03-11 DIAGNOSIS — Z7901 Long term (current) use of anticoagulants: Secondary | ICD-10-CM

## 2015-03-11 DIAGNOSIS — E785 Hyperlipidemia, unspecified: Secondary | ICD-10-CM

## 2015-03-11 DIAGNOSIS — R1310 Dysphagia, unspecified: Secondary | ICD-10-CM | POA: Diagnosis not present

## 2015-03-11 DIAGNOSIS — I2510 Atherosclerotic heart disease of native coronary artery without angina pectoris: Secondary | ICD-10-CM

## 2015-03-11 DIAGNOSIS — I221 Subsequent ST elevation (STEMI) myocardial infarction of inferior wall: Secondary | ICD-10-CM | POA: Diagnosis not present

## 2015-03-11 DIAGNOSIS — I69351 Hemiplegia and hemiparesis following cerebral infarction affecting right dominant side: Secondary | ICD-10-CM | POA: Diagnosis not present

## 2015-03-11 DIAGNOSIS — I10 Essential (primary) hypertension: Secondary | ICD-10-CM

## 2015-03-11 DIAGNOSIS — I69391 Dysphagia following cerebral infarction: Secondary | ICD-10-CM | POA: Diagnosis not present

## 2015-03-11 DIAGNOSIS — E1140 Type 2 diabetes mellitus with diabetic neuropathy, unspecified: Secondary | ICD-10-CM

## 2015-03-11 DIAGNOSIS — Z794 Long term (current) use of insulin: Secondary | ICD-10-CM

## 2015-03-19 ENCOUNTER — Encounter: Payer: Medicaid Other | Attending: Physical Medicine & Rehabilitation

## 2015-03-19 ENCOUNTER — Ambulatory Visit (HOSPITAL_BASED_OUTPATIENT_CLINIC_OR_DEPARTMENT_OTHER): Payer: Self-pay | Admitting: Physical Medicine & Rehabilitation

## 2015-03-19 ENCOUNTER — Encounter: Payer: Self-pay | Admitting: Physical Medicine & Rehabilitation

## 2015-03-19 VITALS — BP 153/82 | HR 83 | Resp 14

## 2015-03-19 DIAGNOSIS — Z87891 Personal history of nicotine dependence: Secondary | ICD-10-CM | POA: Insufficient documentation

## 2015-03-19 DIAGNOSIS — I6931 Cognitive deficits following cerebral infarction: Secondary | ICD-10-CM | POA: Diagnosis not present

## 2015-03-19 DIAGNOSIS — R269 Unspecified abnormalities of gait and mobility: Secondary | ICD-10-CM | POA: Insufficient documentation

## 2015-03-19 DIAGNOSIS — I69319 Unspecified symptoms and signs involving cognitive functions following cerebral infarction: Secondary | ICD-10-CM

## 2015-03-19 DIAGNOSIS — I69398 Other sequelae of cerebral infarction: Secondary | ICD-10-CM

## 2015-03-19 NOTE — Progress Notes (Signed)
Subjective:    Patient ID: Timothy Lambert, male    DOB: [birth year], 59 y.o.   MRN: [id number] 59 year old right-handed male with history of hypertension, alcohol abuse, diabetes mellitus and coronary artery disease, maintained on aspirin therapy.  He lives with his wife and independent.  He presented to [facility] on Jan 23, 2015, with seizures.  Extremely agitated required intubation.  Alcohol level and drug screen negative.  CT of the head negative.  EKG with precordial ST changes concerning for ischemia and infarct.  Transferred to [facility] for ongoing care.  Repeat EKG showed worsened ST elevation, T-wave inversions, troponin greater than 65 DATE OF ADMISSION:  02/01/2015 DATE OF DISCHARGE:  02/14/2015  HPI Readmitted to hospital 5/31 for dehydration Near fall associated with dehydration    Pain Inventory Average Pain 0 Pain Right Now 0 My pain is no pain  In the last 24 hours, has pain interfered with the following? General activity 0 Relation with others 0 Enjoyment of life 0 What TIME of day is your pain at its worst? no pain Sleep (in general) Good  Pain is worse with: no pain Pain improves with: no pain Relief from Meds: no pain  Mobility walk with assistance use a cane how many minutes can you walk? 30 ability to climb steps?  yes do you drive?  no Do you have any goals in this area?  yes  Function not employed: date last employed 01/19/2015 disabled: date disabled 01/22/2015 I need assistance with the following:  meal prep Do you have any goals in this area?  yes  Neuro/Psych weakness trouble walking  Prior Studies hospital f/u  Physicians involved in your care hospital f/u   Family History  Problem Relation Age of Onset  . Diabetes Father    History   Social History  . Marital Status: Married    Spouse Name: N/A  . Number of Children: N/A  . Years of Education: N/A   Social History Main Topics  . Smoking  status: Former Smoker -- 0.33 packs/day for 14 years    Types: Cigarettes  . Smokeless tobacco: Never Used     Comment: "quit smoking cigarettes in 1992"  . Alcohol Use: No     Comment: 02/19/2015 "stopped drinking 01/22/2015"  . Drug Use: No  . Sexual Activity: Not Currently   Other Topics Concern  . None   Social History Narrative   Past Surgical History  Procedure Laterality Date  . Cardiac catheterization N/A 01/23/2015    Procedure: Left Heart Cath and Coronary Angiography;  Surgeon: [name], MD;  Location: [facility] INVASIVE CV LAB CUPID;  Service: Cardiovascular;  Laterality: N/A;  . Cardiac catheterization N/A 01/23/2015    Procedure: Coronary Stent Intervention;  Surgeon: [name], MD;  Location: [facility] INVASIVE CV LAB CUPID;  Service: Cardiovascular;  Laterality: N/A;   Past Medical History  Diagnosis Date  . Coronary artery disease   . Hypertension   . HLD (hyperlipidemia)   . GERD (gastroesophageal reflux disease)   . Refusal of blood transfusions as patient is Jehovah's Witness   . CHF (congestive heart failure)   . Myocardial infarction 01/22/2015    "massive"  . Type II diabetes mellitus   . Seizures 01/22/2015    "in front of paramedics"  . Stroke 01/22/2015    "short term memory loss & right side weak since" (02/19/2015)  . Acute renal failure (ARF) 02/19/2015   BP 153/82 mmHg  Pulse 83  Resp 14  SpO2 99%  Opioid Risk Score:   Fall Risk Score:  `1  Depression screen PHQ 2/9  Depression screen [facility] 2/9 03/19/2015 03/04/2015 02/25/2015 02/15/2015  Decreased Interest 0 0 0 0  Down, Depressed, Hopeless 0 0 2 0  PHQ - 2 Score 0 0 2 0  Altered sleeping 0 - 0 -  Tired, decreased energy 2 - 2 -  Change in appetite 0 - 0 -  Feeling bad or failure about yourself  0 - 0 -  Trouble concentrating 0 - 0 -  Moving slowly or fidgety/restless 1 - 2 -  Suicidal thoughts 0 - 0 -  PHQ-9 Score 3 - 6 -     Review of Systems  Respiratory: Positive for cough.   Endocrine:        High blood sugar  Musculoskeletal: Positive for gait problem.  Neurological: Positive for weakness.  All other systems reviewed and are negative.      Objective:   Physical Exam  Constitutional: He appears well-developed and well-nourished.  HENT:  Head: Normocephalic and atraumatic.  Eyes: Conjunctivae and EOM are normal. Pupils are equal, round, and reactive to light.  Neck: Normal range of motion.  Neurological: He is alert. He displays a negative Romberg sign.  Motor strength is 4/5 bilateral deltoids, biceps, triceps, grip, hip flexor, knee extensor, ankle dorsi flexor and plantar flexor  Dynamic gait is Fair he requires assisted device such as cane, when he turns he tends to lose his balance  Psychiatric: His affect is blunt. His speech is delayed and tangential. He is slowed. Cognition and memory are impaired. He is inattentive.  Nursing note and vitals reviewed.         Assessment & Plan:  1. 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 He would benefit from outpatient speech therapy as well as physical therapy and will make referral. Follow-up with cardiology as an outpatient Follow-up with neurology as an outpatient PMNR follow-up 1-2 months No driving, needs 24 7 supervision at the current time

## 2015-03-21 ENCOUNTER — Telehealth: Payer: Self-pay | Admitting: *Deleted

## 2015-03-21 NOTE — Telephone Encounter (Signed)
Brilinta samples and copay card placed at the front desk for patient.

## 2015-03-27 ENCOUNTER — Ambulatory Visit: Payer: Medicaid Other | Attending: Internal Medicine | Admitting: Internal Medicine

## 2015-03-27 ENCOUNTER — Ambulatory Visit: Payer: Medicaid Other

## 2015-03-27 ENCOUNTER — Encounter: Payer: Self-pay | Admitting: Internal Medicine

## 2015-03-27 ENCOUNTER — Other Ambulatory Visit: Payer: Self-pay | Admitting: *Deleted

## 2015-03-27 VITALS — BP 150/90 | HR 81 | Temp 98.6°F | Ht 67.0 in | Wt 159.0 lb

## 2015-03-27 DIAGNOSIS — I10 Essential (primary) hypertension: Secondary | ICD-10-CM | POA: Diagnosis not present

## 2015-03-27 DIAGNOSIS — E1165 Type 2 diabetes mellitus with hyperglycemia: Secondary | ICD-10-CM

## 2015-03-27 DIAGNOSIS — I2510 Atherosclerotic heart disease of native coronary artery without angina pectoris: Secondary | ICD-10-CM

## 2015-03-27 DIAGNOSIS — I5021 Acute systolic (congestive) heart failure: Secondary | ICD-10-CM | POA: Diagnosis not present

## 2015-03-27 LAB — BASIC METABOLIC PANEL
BUN: 17 mg/dL (ref 6–23)
CO2: 27 mEq/L (ref 19–32)
Calcium: 9.4 mg/dL (ref 8.4–10.5)
Chloride: 104 mEq/L (ref 96–112)
Creat: 1.03 mg/dL (ref 0.50–1.35)
Glucose, Bld: 264 mg/dL — ABNORMAL HIGH (ref 70–99)
POTASSIUM: 4.6 meq/L (ref 3.5–5.3)
Sodium: 142 mEq/L (ref 135–145)

## 2015-03-27 LAB — GLUCOSE, POCT (MANUAL RESULT ENTRY): POC Glucose: 255 mg/dl — AB (ref 70–99)

## 2015-03-27 MED ORDER — GLUCOSE BLOOD VI STRP: ORAL_STRIP | Status: DC

## 2015-03-27 MED ORDER — INSULIN ASPART 100 UNIT/ML ~~LOC~~ SOLN: 10.0000 [IU] | Freq: Once | SUBCUTANEOUS | Status: DC

## 2015-03-27 NOTE — Patient Instructions (Signed)
I will recheck your potassium level and decided if I will place you on a low dose BP medication that will also help your kidneys.

## 2015-03-27 NOTE — Progress Notes (Signed)
Patient ID: Timothy Lambert, male   DOB: June 21, 1956, 58 y.o.   MRN: [id number]  CC: DM and HTN f/u  [name] is a 59 y.o. male here today for a follow up visit.  Patient has past medical history of hypertension, hyperlipidemia, diabetes mellitus (A1c 9.0), GERD, recent stroke and STEMI s/p stent, seizure, congestive heart failure, chronic kidney disease-stage III, hx of alcohol abuse, admitted 02/01/15- 02/14/15 due to acute stroke and NSTEMI s/p stent placement and recently for generalized weakness between 5/31-02/21/15. He has since followed up with Dr. Jarold Song here and was taken off lisinopril for cough. He was not placed on Losartan due to history of hyperkalemia. His last K level was on the high end of normal.  Today--Patient reports that he takes his 24 units of Lantus each morning, he did not bring his blood sugar log today. He reports that he ate 2 bowls of Coco crisp cereal this morning but upon awakening his cbg was 121. He only checks his sugars once in the morning with ranges of 89 to 130. Home BP readings are usually 130-140's. His wife states that his BP was elevated this AM although he took his medication. No recent seizure activity.  Patient has No headache, No chest pain, No abdominal pain - No Nausea, No new weakness tingling or numbness, No Cough - SOB.  No Known Allergies Past Medical History  Diagnosis Date  . Coronary artery disease   . Hypertension   . HLD (hyperlipidemia)   . GERD (gastroesophageal reflux disease)   . Refusal of blood transfusions as patient is Jehovah's Witness   . CHF (congestive heart failure)   . Myocardial infarction 01/22/2015    "massive"  . Type II diabetes mellitus   . Seizures 01/22/2015    "in front of paramedics"  . Stroke 01/22/2015    "short term memory loss & right side weak since" (02/19/2015)  . Acute renal failure (ARF) 02/19/2015   Current Outpatient Prescriptions on File Prior to Visit  Medication Sig Dispense Refill  . aspirin 81  MG chewable tablet Chew 1 tablet (81 mg total) by mouth daily.    Marland Kitchen atorvastatin (LIPITOR) 80 MG tablet Take 1 tablet (80 mg total) by mouth daily at 6 PM. 30 tablet 1  . carvedilol (COREG) 25 MG tablet Take 1 tablet (25 mg total) by mouth 2 (two) times daily with a meal. 60 tablet 1  . folic acid (FOLVITE) 1 MG tablet Take 1 tablet (1 mg total) by mouth daily. 30 tablet 1  . Insulin Glargine (LANTUS SOLOSTAR) 100 UNIT/ML Solostar Pen Inject 24 Units into the skin daily at 10 pm. 5 pen PRN  . lactulose (CHRONULAC) 10 GM/15ML solution Take 15 mLs (10 g total) by mouth 3 (three) times daily. 946 mL 1  . levETIRAcetam (KEPPRA) 100 MG/ML solution Take 5 mLs (500 mg total) by mouth every 12 (twelve) hours. 473 mL 12  . ticagrelor (BRILINTA) 90 MG TABS tablet Take 1 tablet (90 mg total) by mouth 2 (two) times daily. 60 tablet 1   No current facility-administered medications on file prior to visit.   Family History  Problem Relation Age of Onset  . Diabetes Father    History   Social History  . Marital Status: Married    Spouse Name: N/A  . Number of Children: N/A  . Years of Education: N/A   Occupational History  . Not on file.   Social History Main Topics  . Smoking  status: Former Smoker -- 0.33 packs/day for 14 years    Types: Cigarettes  . Smokeless tobacco: Never Used     Comment: "quit smoking cigarettes in 1992"  . Alcohol Use: No     Comment: 02/19/2015 "stopped drinking 01/22/2015"  . Drug Use: No  . Sexual Activity: Not Currently   Other Topics Concern  . Not on file   Social History Narrative    Review of Systems: See HPI   Objective:   Filed Vitals:   03/27/15 1104  BP: 161/92  Pulse: 81  Temp: 98.6 F (37 C)    Physical Exam  Constitutional: He is oriented to person, place, and time.  Cardiovascular: Normal rate, regular rhythm and normal heart sounds.   Pulmonary/Chest: Effort normal.  Abdominal: Soft. Bowel sounds are normal.  Musculoskeletal: Normal  range of motion. He exhibits no edema.  Neurological: He is alert and oriented to person, place, and time.    Lab Results  Component Value Date   WBC 7.2 02/20/2015   HGB 9.3* 02/20/2015   HCT 26.7* 02/20/2015   MCV 86.1 02/20/2015   PLT 249 02/20/2015   Lab Results  Component Value Date   CREATININE 1.18 02/25/2015   BUN 22 02/25/2015   NA 140 02/25/2015   K 5.2 02/25/2015   CL 104 02/25/2015   CO2 26 02/25/2015    Lab Results  Component Value Date   HGBA1C 9.0 02/15/2015   Lipid Panel     Component Value Date/Time   CHOL 143 01/26/2015 0349   TRIG 192* 01/29/2015 0430   HDL 40* 01/26/2015 0349   CHOLHDL 3.6 01/26/2015 0349   VLDL 33 01/26/2015 0349   LDLCALC 70 01/26/2015 0349       Assessment and plan:   Chevez was seen today for hypertension and diabetes.  Diagnoses and all orders for this visit:  Type 2 diabetes mellitus with hyperglycemia Orders: -     Glucose (CBG) -     Basic Metabolic Panel Advised patient to bring cbg log to every visit. I have went over diet changes and exercise. He will stay on current Lantus dose until I review his BS log. Advised him to check cbg three times per day  Essential hypertension  BP is not currently at goal. I will recheck his BMET to look at kidney function and potassium. If normal I will like to place patient back on a ARB. Will monitor potassium levels closely due to tendency towards hyperkalemia.  Acute systolic congestive heart failure Continue Coreg and Cardiology referral. Explained the importance of strict BP control   Coronary artery disease involving native coronary artery of native heart without angina pectoris Continue Brilinta. Strict chronic disease and cholesterol control enforced  Return in about 2 weeks (around 04/10/2015) for Nurse Visit-BP check/CBG log review and 3 mo PCP .   Chari Manning, [facility] [phone] 03/27/2015, 11:17 AM

## 2015-03-27 NOTE — Progress Notes (Signed)
Patient not sure why he is here He has history of DM2 and HTN and CVA He needs no refills and has no complaints

## 2015-03-29 ENCOUNTER — Telehealth: Payer: Self-pay | Admitting: *Deleted

## 2015-03-29 NOTE — Telephone Encounter (Signed)
Verified patient's name and date of birth and gave results. Patient verbalized understanding and had no questions.

## 2015-03-29 NOTE — Telephone Encounter (Signed)
-----   Message from Lance Bosch, NP sent at 03/28/2015  4:53 PM EDT ----- Lab normal except sugar was elevated on that day

## 2015-04-02 ENCOUNTER — Encounter: Payer: Self-pay | Admitting: Nurse Practitioner

## 2015-04-02 ENCOUNTER — Ambulatory Visit (INDEPENDENT_AMBULATORY_CARE_PROVIDER_SITE_OTHER): Payer: Medicaid Other | Admitting: Nurse Practitioner

## 2015-04-02 DIAGNOSIS — N189 Chronic kidney disease, unspecified: Secondary | ICD-10-CM

## 2015-04-02 DIAGNOSIS — I2109 ST elevation (STEMI) myocardial infarction involving other coronary artery of anterior wall: Secondary | ICD-10-CM | POA: Diagnosis not present

## 2015-04-02 DIAGNOSIS — I10 Essential (primary) hypertension: Secondary | ICD-10-CM

## 2015-04-02 DIAGNOSIS — Z955 Presence of coronary angioplasty implant and graft: Secondary | ICD-10-CM

## 2015-04-02 LAB — BASIC METABOLIC PANEL
BUN: 16 mg/dL (ref 6–23)
CO2: 30 mEq/L (ref 19–32)
Calcium: 9.3 mg/dL (ref 8.4–10.5)
Chloride: 105 mEq/L (ref 96–112)
Creatinine, Ser: 1.06 mg/dL (ref 0.40–1.50)
GFR: 92.17 mL/min (ref >60.00)
Glucose, Bld: 233 mg/dL — ABNORMAL HIGH (ref 70–99)
Potassium: 4 mEq/L (ref 3.5–5.1)
Sodium: 141 mEq/L (ref 135–145)

## 2015-04-02 LAB — CBC
HCT: 31.6 % — ABNORMAL LOW (ref 39.0–52.0)
Hemoglobin: 10.6 g/dL — ABNORMAL LOW (ref 13.0–17.0)
MCHC: 33.5 g/dL (ref 30.0–36.0)
MCV: 91.8 fl (ref 78.0–100.0)
Platelets: 197 10*3/uL (ref 150.0–400.0)
RBC: 3.45 Mil/uL — ABNORMAL LOW (ref 4.22–5.81)
RDW: 15.3 % (ref 11.5–15.5)
WBC: 8.8 10*3/uL (ref 4.0–10.5)

## 2015-04-02 MED ORDER — HYDRALAZINE HCL 25 MG PO TABS: 25.0000 mg | ORAL_TABLET | Freq: Three times a day (TID) | ORAL | Status: DC

## 2015-04-02 NOTE — Patient Instructions (Addendum)
We will be checking the following labs today - BMET & CBC   Medication Instructions:    Continue with your current medicines but  I am adding Hydralazine 25 mg to take three times a day - this is for your BP    Testing/Procedures To Be Arranged:  Echocardiogram after 04/26/15  Follow-Up:   See Dr. Aundra Dubin in the [place] clinic in 2 to 4 weeks    Other Special Instructions:   N/A  Call the [place] office at [phone] if you have any questions, problems or concerns.

## 2015-04-02 NOTE — Progress Notes (Signed)
CARDIOLOGY OFFICE NOTE  Date:  04/02/2015    Timothy Lambert Date of Birth: 1956/07/22 Medical Record [id number]  PCP:  Lance Bosch, NP  Cardiologist:  Aundra Dubin (new)  Chief Complaint  Patient presents with  . Coronary Artery Disease    Hospital follow up - new to Dr. Aundra Dubin    History of Present Illness: Timothy Lambert is a 59 y.o. male who presents today for what seems to be a post hospital visit. He does not know why he is here today.   He has a history of hypertension, hyperlipidemia, diabetes mellitus (A1c 9.0), GERD, recent stroke and recent anterior MI s/p stent (May 2016), seizure disorder, congestive heart failure with EF of 30 to 35%, chronic kidney disease, & hx of alcohol abuse.   He has been readmitted since his initial MI with renal failure. No longer on ACE.   Comes in today. Here with his wife. Again, does not know why he is here. Has a Life Vest on. BP sky high. Says his Life Vest has been in place since discharge back in May. He says he feels ok. Says he is not drinking. No chest pain. Not short of breath. Balance is an issue. Using a cane. No falls. No syncope and not dizzy or lightheaded.  Looks like his ACE caused CKD - also reports cough. Last BMET with normal renal function.    Past Medical History  Diagnosis Date  . Coronary artery disease   . Hypertension   . HLD (hyperlipidemia)   . GERD (gastroesophageal reflux disease)   . Refusal of blood transfusions as patient is Jehovah's Witness   . CHF (congestive heart failure)   . Myocardial infarction 01/22/2015    "massive"  . Type II diabetes mellitus   . Seizures 01/22/2015    "in front of paramedics"  . Stroke 01/22/2015    "short term memory loss & right side weak since" (02/19/2015)  . Acute renal failure (ARF) 02/19/2015    Past Surgical History  Procedure Laterality Date  . Cardiac catheterization N/A 01/23/2015    Procedure: Left Heart Cath and Coronary Angiography;  Surgeon: [name], MD;  Location: [facility] INVASIVE CV LAB CUPID;  Service: Cardiovascular;  Laterality: N/A;  . Cardiac catheterization N/A 01/23/2015    Procedure: Coronary Stent Intervention;  Surgeon: [name], MD;  Location: [facility] INVASIVE CV LAB CUPID;  Service: Cardiovascular;  Laterality: N/A;     Medications: Current Outpatient Prescriptions  Medication Sig Dispense Refill  . aspirin 81 MG chewable tablet Chew 1 tablet (81 mg total) by mouth daily.    Marland Kitchen atorvastatin (LIPITOR) 80 MG tablet Take 1 tablet (80 mg total) by mouth daily at 6 PM. 30 tablet 1  . carvedilol (COREG) 25 MG tablet Take 1 tablet (25 mg total) by mouth 2 (two) times daily with a meal. 60 tablet 1  . folic acid (FOLVITE) 1 MG tablet Take 1 tablet (1 mg total) by mouth daily. 30 tablet 1  . glucose blood (TRUE METRIX BLOOD GLUCOSE TEST) test strip Use as instructed 100 each 12  . Insulin Glargine (LANTUS SOLOSTAR) 100 UNIT/ML Solostar Pen Inject 24 Units into the skin daily at 10 pm. 5 pen PRN  . lactulose (CHRONULAC) 10 GM/15ML solution Take 15 mLs (10 g total) by mouth 3 (three) times daily. 946 mL 1  . levETIRAcetam (KEPPRA) 100 MG/ML solution Take 5 mLs (500 mg total) by mouth every 12 (twelve) hours. 473 mL  12  . ticagrelor (BRILINTA) 90 MG TABS tablet Take 1 tablet (90 mg total) by mouth 2 (two) times daily. 60 tablet 1   No current facility-administered medications for this visit.    Allergies: No Known Allergies  Social History: The patient  reports that he has quit smoking. His smoking use included Cigarettes. He has a 4.62 pack-year smoking history. He has never used smokeless tobacco. He reports that he does not drink alcohol or use illicit drugs.   Family History: The patient's family history includes Diabetes in his father.   Review of Systems: Please see the history of present illness.   Otherwise, the review of systems is positive for none.   All other systems are reviewed and negative.   Physical  Exam: VS:  BP 200/100 mmHg  Pulse 84  Ht 5\' 6"  (1.676 m)  Wt 160 lb 6.4 oz (72.757 kg)  BMI 25.90 kg/m2 .  BMI Body mass index is 25.9 kg/(m^2).  Wt Readings from Last 3 Encounters:  04/02/15 160 lb 6.4 oz (72.757 kg)  03/27/15 159 lb (72.122 kg)  03/04/15 152 lb 6.4 oz (69.128 kg)    General: He is alert - seems sedated but in no acute distress. Affect a little flat.  HEENT: Normal. Neck: Supple, no JVD, carotid bruits, or masses noted.  Cardiac: Regular rate and rhythm. +S4. No edema.  Respiratory:  Lungs are fairly clear to auscultation bilaterally with normal work of breathing.  GI: Soft and nontender.  MS: No deformity or atrophy. Gait and ROM intact. Using a cane. Skin: Warm and dry. Color is normal.  Neuro:  Strength and sensation are intact and no gross focal deficits noted.  Psych: Alert, appropriate and with normal affect.   LABORATORY DATA:  EKG:  EKG is not ordered today.   Lab Results  Component Value Date   WBC 7.2 02/20/2015   HGB 9.3* 02/20/2015   HCT 26.7* 02/20/2015   PLT 249 02/20/2015   GLUCOSE 264* 03/27/2015   CHOL 143 01/26/2015   TRIG 192* 01/29/2015   HDL 40* 01/26/2015   LDLCALC 70 01/26/2015   ALT 24 02/19/2015   AST 19 02/19/2015   NA 142 03/27/2015   K 4.6 03/27/2015   CL 104 03/27/2015   CREATININE 1.03 03/27/2015   BUN 17 03/27/2015   CO2 27 03/27/2015   TSH 3.562 02/21/2015   INR 1.26 02/19/2015   HGBA1C 9.0 02/15/2015    BNP (last 3 results)  Recent Labs  02/19/15 1835 02/20/15 0509  BNP 77.0 113.3*    ProBNP (last 3 results) No results for input(s): PROBNP in the last 8760 hours.   Other Studies Reviewed Today: Cardiac Cath Conclusion from 01/23/2015     Dist LAD lesion, 99% stenosed. There is a 0% residual stenosis post intervention.  A drug-eluting stent was placed.  1. Acute anterior MI secondary to subtotal occlusion of the mid LAD, treated successfully with PCI using a Synergy DES 2. Diffuse coronary  artery disease with moderate stenosis of the left mainstem, severe stenosis of the first diagonal and ramus intermedius, and moderate stenosis of the RCA 3. Moderately severe LV systolic dysfunction with estimated LVEF 35%  RECOMMENDATIONS: If no evidence of recurrent angina/ischemia, favor ongoing medical therapy for residual CAD.    Echo Study Conclusions from 01/24/2015  - Left ventricle: The cavity size was normal. Wall thickness was increased in a pattern of moderate LVH. Systolic function was moderately to severely reduced. The estimated ejection fraction  was in the range of 30% to 35%. Mid to apical anteroseptal akinesis, apical lateral akinesis, apical inferior akinesis, akinesis of true apex. Anterior hypokinesis. Doppler parameters are consistent with abnormal left ventricular relaxation (grade 1 diastolic dysfunction). - Aortic valve: There was no stenosis. - Mitral valve: There was trivial regurgitation. - Left atrium: The atrium was mildly dilated. - Right ventricle: The cavity size was normal. Systolic function was normal. - Pulmonary arteries: PA peak pressure: 66 mm Hg (S). - Systemic veins: IVC measured 2.5 cm with < 50% respirophasic variation, suggesting RA pressure 15 mmHg. - Pericardium, extracardiac: A trivial pericardial effusion was identified.  Impressions:  - Normal LV size with moderate LV hypertrophy. EF 30-35% with wall motion abnormalities as noted above. Normal RV size and systolic function. No significant valvular abnormalities. Moderate pulmonary hypertension.  Assessment/Plan: 1. Prior anterior MI with PCI to the LAD - on DAPT - seems to be doing ok clinically.   2. Systolic HF - EF of 30 to 35% - really not on ideal therapy. No ACE/ARB due to CKD/tendency towards hyperkalemia. Adding hydralazine today. Plan for repeat echo after 04/26/2015. [facility] lab today. Will get his follow up in the CHF clinic with Dr. Aundra Dubin.   3.  Life Vest in place - no problems noted. He is unsure if he would proceed with ICD implant.   4. HTN - malignant - recheck by me is down to 160/90. Adding Hydralazine today.   5. Alcohol abuse - says he is no longer drinking - continued abstinence encouraged.   Current medicines are reviewed with the patient today.  The patient does not have concerns regarding medicines other than what has been noted above.  The following changes have been made:  See above.  Labs/ tests ordered today include:    Orders Placed This Encounter  Procedures  . Basic metabolic panel  . CBC  . ECHOCARDIOGRAM COMPLETE     Disposition:   FU with Dr. Aundra Dubin in [facility] clinic.   Patient is agreeable to this plan and will call if any problems develop in the interim.   Signed: Burtis Junes, RN, ANP-C 04/02/2015 10:02 AM  [facility] [address] [place],   [address] Phone: [phone] Fax: [phone]

## 2015-04-04 ENCOUNTER — Ambulatory Visit (HOSPITAL_COMMUNITY): Payer: Medicaid Other | Admitting: Speech Pathology

## 2015-04-04 ENCOUNTER — Ambulatory Visit (HOSPITAL_COMMUNITY): Payer: Medicaid Other | Attending: Physical Medicine & Rehabilitation | Admitting: Physical Therapy

## 2015-04-04 DIAGNOSIS — R413 Other amnesia: Secondary | ICD-10-CM | POA: Insufficient documentation

## 2015-04-04 DIAGNOSIS — R29898 Other symptoms and signs involving the musculoskeletal system: Secondary | ICD-10-CM | POA: Diagnosis present

## 2015-04-04 DIAGNOSIS — R269 Unspecified abnormalities of gait and mobility: Secondary | ICD-10-CM

## 2015-04-04 NOTE — Patient Instructions (Signed)
Knee Extension (Sitting)   Place __4__ pound weight on left ankle and straighten knee fully, lower slowly. Repeat __15__ times per set. Do __1__ sets per session. Do _2___ sessions per day.  http://orth.exer.us/732   Copyright  VHI. All rights reserved.  Self-Mobilization: Knee Flexion (Prone)  With 4 # weight Bring right  heel toward buttocks as close as possible. Hold __2__ seconds. Relax. Repeat ___10_ times per set. Do __1__ sets per session. Do _2__ sessions per day.  http://orth.exer.us/596   Copyright  VHI. All rights reserved.  Strengthening: Hip Extension (Prone)  With 4# weight on leg Tighten muscles on front of right  thigh, then lift leg _2___ inches from surface, keeping knee locked. Repeat __10__ times per set. Do _1___ sets per session. Do _2___ sessions per day.  http://orth.exer.us/620   Copyright  VHI. All rights reserved.  Balance: Unilateral  At the kitchen counter.  Attempt to balance on left leg, eyes open. Hold ___30_ seconds. Repeat _5___ times per set. Do _1___ sets per session. Do __2__ sessions per day. Perform exercise with eyes closed.  http://orth.exer.us/28   Copyright  VHI. All rights reserved.  Heel Raise: Bilateral (Standing)  At kitchen counter  Rise on balls of feet. Repeat _10___ times per set. Do __1__ sets per session. Do ___2_ sessions per day.  http://orth.exer.us/38   Copyright  VHI. All rights reserved.  Feet Heel-Toe "Tandem", Varied Arm Positions - Eyes Open  At kitchen counter. With eyes open, right foot directly in front of the other, arms out, look straight ahead at a stationary object. Hold _10-3-___ seconds. Repeat __3__ times per session. Do __3__ sessions per day.  Copyright  VHI. All rights reserved.

## 2015-04-04 NOTE — Therapy (Signed)
[facility] [place], [place], [address] Phone: [phone]   Fax:  [phone]  Speech Language Pathology Evaluation  Patient Details  Name: Timothy Lambert MRN: [id number] Date of Birth: 02/16/1956 Referring Provider:  Charlett Blake, MD  Encounter Date: 04/04/2015      End of Session - 04/04/15 1822    Visit Number 1   Number of Visits 1   Authorization Type No insurance; medicaid pending   SLP Start Time 0940   SLP Stop Time  1019   SLP Time Calculation (min) 39 min   Activity Tolerance Patient tolerated treatment well      Past Medical History  Diagnosis Date  . Coronary artery disease   . Hypertension   . HLD (hyperlipidemia)   . GERD (gastroesophageal reflux disease)   . Refusal of blood transfusions as patient is Jehovah's Witness   . CHF (congestive heart failure)   . Myocardial infarction 01/22/2015    "massive"  . Type II diabetes mellitus   . Seizures 01/22/2015    "in front of paramedics"  . Stroke 01/22/2015    "short term memory loss & right side weak since" (02/19/2015)  . Acute renal failure (ARF) 02/19/2015    Past Surgical History  Procedure Laterality Date  . Cardiac catheterization N/A 01/23/2015    Procedure: Left Heart Cath and Coronary Angiography;  Surgeon: Timothy Mocha, MD;  Location: [facility] INVASIVE CV LAB CUPID;  Service: Cardiovascular;  Laterality: N/A;  . Cardiac catheterization N/A 01/23/2015    Procedure: Coronary Stent Intervention;  Surgeon: Timothy Mocha, MD;  Location: [facility] INVASIVE CV LAB CUPID;  Service: Cardiovascular;  Laterality: N/A;    There were no vitals filed for this visit.  Visit Diagnosis: Memory deficit      Subjective Assessment - 04/04/15 1811    Subjective "I am doing pretty good with my memory... I have trouble with my balance."   Patient is accompained by: Family member   Special Tests MOCA   Currently in Pain? No/denies            SLP Evaluation  [facility] - 04/04/15 1812    SLP Visit Information   SLP Received On 04/04/15   Onset Date May 2016   Medical Diagnosis CVA   Subjective   Subjective I am doing pretty well with my memory, I have trouble with my balance   Patient/Family Stated Goal Work on my balance   Pain Assessment   Currently in Pain? No/denies   General Information   Other Pertinent Information Patient has past medical history of hypertension, hyperlipidemia, diabetes mellitus (A1c 9.0), GERD, recent stroke and STEMI s/p stent, seizure, congestive heart failure, chronic kidney disease-stage III, hx of alcohol abuse, admitted 02/01/15- 02/14/15 due to acute stroke and NSTEMI s/p stent placement and recently for generalized weakness between 5/31-02/21/15.    Behavioral/Cognition WNL   Mobility Status cane   Prior Functional Status   Cognitive/Linguistic Baseline Within functional limits   Type of Home House    Lives With Spouse   Education GED   Vocation On disability   Cognition   Overall Cognitive Status Impaired/Different from baseline   Area of Impairment Memory   Memory Decreased short-term memory   Memory Comments very mild memory deficits on testing 4/5 indpendent recall of words   Memory Impaired   Memory Impairment Retrieval deficit   Awareness Appears intact   Problem Solving Appears intact   Auditory Comprehension   Overall  Auditory Comprehension Appears within functional limits for tasks assessed   Yes/No Questions Within Functional Limits   Commands Within Functional Limits   Conversation Moderately complex   Other Conversation Comments PT noted that she needed to model some directions for him   Interfering Components Working Orthoptist   Discrimination Within Function Limits   Reading Comprehension   Reading Status Not tested   Expression   Primary Mode of Expression Verbal   Verbal Expression   Overall Verbal  Expression Appears within functional limits for tasks assessed   Initiation No impairment   Level of Generative/Spontaneous Verbalization Conversation   Repetition No impairment   Naming No impairment   Pragmatics No impairment  flat affect   Non-Verbal Means of Communication Not applicable   Written Expression   Dominant Hand Right   Written Expression Not tested   Oral Motor/Sensory Function   Overall Oral Motor/Sensory Function Appears within functional limits for tasks assessed   Labial ROM Within Functional Limits   Labial Strength Within Functional Limits   Labial Sensation Within Functional Limits   Labial Coordination WFL   Lingual ROM Within Functional Limits   Lingual Symmetry Within Functional Limits   Lingual Strength Within Functional Limits   Lingual Sensation Within Functional Limits   Lingual Coordination WFL   Facial ROM Within Functional Limits   Facial Symmetry Within Functional Limits   Facial Strength Within Functional Limits   Facial Sensation Within Functional Limits   Facial Coordination WFL   Velum Within Functional Limits   Mandible Within Functional Limits   Motor Speech   Overall Motor Speech Appears within functional limits for tasks assessed  pt reports occasional "stutter"   Respiration Within functional limits   Phonation Normal   Resonance Within functional limits   Articulation Within functional limitis   Intelligibility Intelligible   Motor Planning Witnin functional limits   Motor Speech Errors Not applicable   Phonation WFL   Standardized Assessments   Standardized Assessments  Montreal Cognitive Assessment (MOCA)   Montreal Cognitive Assessment (Timothy Lambert)  26/30             SLP Education - 04/04/15 1821    Education provided Yes   Education Details Results and recommendations from evaluation; sent home memory strategies   Person(s) Educated Patient;Spouse   Methods Explanation;Handout   Comprehension Verbalized understanding               Plan - 04/04/15 1823    Clinical Impression Statement [name] was accompanied to the evaluation by his wife who reported that she felt he was doing well, but occasionally "stuttered". Pt reported being back to baseline in terms of thinking, memory, and speech. He acknowledged that he does occasionally stutter, but that it does not bother him. The MOCA was administered and he scored 26/30 which is WNL. He was able to recall 4 of 5 words independently after 10 minutes and 5/5 after category cue. He manages his home medications with the use of a pill box. Pt currently does not have insurance and would have to pay out of pocket for therapy. He was given written information on memory strategies and speech fluency strategies. No SLP intervention is recommended at this time, however pt was encouraged to contact us if he found that he was not managing at home. They are hopeful that he will receive medicaid.    Consulted and Agree with Plan of Care Patient;Family member/caregiver  Family Member Consulted Wife        Problem List Patient Active Problem List   Diagnosis Date Noted  . Hypertension 02/25/2015  . Cough 02/25/2015  . Constipation 02/25/2015  . Generalized weakness 02/19/2015  . Essential hypertension 02/19/2015  . CAD (coronary artery disease) 02/19/2015  . Acute renal failure superimposed on stage 3 chronic kidney disease 02/19/2015  . CHF (congestive heart failure) 02/10/2015  . Hyperkalemia   . 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 Metabolic encephalopathy   . CVA (cerebral vascular accident)   . Dysphagia   . ST elevation (STEMI) myocardial infarction involving left anterior descending coronary artery   . Combined systolic and diastolic congestive heart failure   . Paroxysmal atrial flutter   . Hypokalemia   . Diabetes type 2, uncontrolled   . HLD (hyperlipidemia)   . Altered mental status   . Hyperglycemia   . Seizure   . Acute embolic stroke    . Acute respiratory failure with hypoxia 01/24/2015  . Acute renal failure syndrome   . Acute respiratory failure   . Hypertensive emergency 01/23/2015  . History of ST elevation myocardial infarction (STEMI) 01/23/2015  . Acute MI anterior wall first episode care   . Altered mental state    Thank you,  Teddy Spike [id number]  [facility] 04/04/2015, 6:38 PM  [facility] [address] [place], [place], [address] Phone: [phone]   Fax:  [phone]

## 2015-04-04 NOTE — Therapy (Signed)
[place], [place], [address] Phone: [phone]   Fax:  [phone]  Physical Therapy Evaluation  Patient Details  Name: Timothy Lambert MRN: [id number] Date of Birth: Feb 05, 1956 Referring Provider:  Charlett Blake, MD  Encounter Date: 04/04/2015      PT End of Session - 04/04/15 1112    Visit Number 1   Number of Visits 1   Authorization Type none   PT Start Time 1015   PT Stop Time 1104   PT Time Calculation (min) 49 min   Equipment Utilized During Treatment Gait belt   Activity Tolerance Patient tolerated treatment well   Behavior During Therapy [facility] for tasks assessed/performed      Past Medical History  Diagnosis Date  . Coronary artery disease   . Hypertension   . HLD (hyperlipidemia)   . GERD (gastroesophageal reflux disease)   . Refusal of blood transfusions as patient is [employer]hov[employer]'s Witness   . CHF (congestive heart failure)   . Myocardial infarction 01/22/2015    "massive"  . Type II diabetes mellitus   . Seizures 01/22/2015    "in front of paramedics"  . Stroke 01/22/2015    "short term memory loss & right side weak since" (02/19/2015)  . Acute renal failure (ARF) 02/19/2015    Past Surgical History  Procedure Laterality Date  . Cardiac catheterization N/A 01/23/2015    Procedure: Left Heart Cath and Coronary Angiography;  Surgeon: [name], MD;  Location: [facility] INVASIVE CV LAB CUPID;  Service: Cardiovascular;  Laterality: N/A;  . Cardiac catheterization N/A 01/23/2015    Procedure: Coronary Stent Intervention;  Surgeon: [name], MD;  Location: [facility] INVASIVE CV LAB CUPID;  Service: Cardiovascular;  Laterality: N/A;    There were no vitals filed for this visit.  Visit Diagnosis:  Weakness of right leg  Abnormality of gait      Subjective Assessment - 04/04/15 1005    Subjective Timothy Lambert states he wants to get rid of his quad cane.     Pertinent History Patient has past  medical history of hypertension, hyperlipidemia, diabetes mellitus (A1c 9.0), GERD, recent stroke and STEMI s/p stent, seizure, congestive heart failure, chronic kidney disease-stage III, hx of alcohol abuse, admitted 02/01/15- 02/14/15 due to acute stroke and NSTEMI s/p stent placement and recently for generalized weakness between 5/31-02/21/15.    How long can you stand comfortably? able to stand for over ten minutes    How long can you walk comfortably? Pt has walked with his quad cane for 1/2 mile which takes him about 20 minutes.    Currently in Pain? No/denies            [facility] PT Assessment - 04/04/15 0001    Assessment   Medical Diagnosis CVA   Onset Date/Surgical Date 02/19/15   Prior Therapy acute; [facility]   Precautions   Precautions None   Restrictions   Weight Bearing Restrictions No   Balance Screen   Has the patient fallen in the past 6 months No   Has the patient had a decrease in activity level because of a fear of falling?  No   Is the patient reluctant to leave their home because of a fear of falling?  No   Cognition   Overall Cognitive Status Within Functional Limits for tasks assessed   Functional Tests   Functional tests --   Single Leg Stance   Comments Rt 6 seconds; Lt  16    Sit to Stand   Comments 5 reps takes 13 seconds    ROM / Strength   AROM / PROM / Strength Strength   Strength   Overall Strength Comments --  Rt LE 4+ for quadricep; 4/5 for hamstring; hip extensor/abdu   Standardized Balance Assessment   Standardized Balance Assessment Dynamic Gait Index   Berg Balance Test   Sit to Stand Able to stand without using hands and stabilize independently   Standing Unsupported Able to stand safely 2 minutes   Sitting with Back Unsupported but Feet Supported on Floor or Stool Able to sit safely and securely 2 minutes   Stand to Sit Controls descent by using hands   Transfers Able to transfer safely, minor use of hands   Standing Unsupported with  Eyes Closed Able to stand 10 seconds with supervision   Standing Ubsupported with Feet Together Able to place feet together independently and stand 1 minute safely   From Standing, Reach Forward with Outstretched Arm Can reach confidently >25 cm (10")   From Standing Position, Pick up Object from Floor Able to pick up shoe safely and easily   From Standing Position, Turn to Look Behind Over each Shoulder Looks behind from both sides and weight shifts well   Turn 360 Degrees Able to turn 360 degrees safely but slowly   Standing Unsupported, Alternately Place Feet on Step/Stool Able to stand independently and safely and complete 8 steps in 20 seconds   Standing Unsupported, One Foot in Front Able to plae foot ahead of the other independently and hold 30 seconds   Standing on One Leg Able to lift leg independently and hold 5-10 seconds   Total Score 50   Dynamic Gait Index   Level Surface Normal   Change in Gait Speed Normal   Gait with Horizontal Head Turns Normal   Gait with Vertical Head Turns Mild Impairment   Gait and Pivot Turn Mild Impairment   Step Over Obstacle Mild Impairment   Step Around Obstacles Normal   Steps Normal   Total Score 21                 PT Education - 04/04/15 1103    Education provided Yes   Education Details for [employer]) Educated Patient   Methods Explanation;Handout   Comprehension Returned demonstration;Verbalized understanding          PT Short Term Goals - 04/04/15 1117    PT SHORT TERM GOAL #1   Title I in HEP   Time 1   Period Days   Status Achieved                  Plan - 04/04/15 1113    Clinical Impression Statement Timothy Lambert is a 59 yo male who has had a recent stroke.  He has had therapy in acute, IP rehab as well as [place] and is now being referred to out-patient PT.  Examination reveals minor deficits in balance as well as Rt LE strength.  Timothy Lambert has applied for disabilitiy and medicaid but at this time  is unsure if he will qualify.  Options for treatment were discussed . Timothy Lambert decided to work on the [employer] given to him and if he feels he does not progress to his normal state he will call and schedule future appointments.    Pt will benefit from skilled therapeutic intervention in order to improve on the following deficits  Decreased balance;Decreased strength   PT Next Visit Plan D/C to HEP         Problem List Patient Active Problem List   Diagnosis Date Noted  . Hypertension 02/25/2015  . Cough 02/25/2015  . Constipation 02/25/2015  . Generalized weakness 02/19/2015  . Essential hypertension 02/19/2015  . CAD (coronary artery disease) 02/19/2015  . Acute renal failure superimposed on stage 3 chronic kidney disease 02/19/2015  . CHF (congestive heart failure) 02/10/2015  . Hyperkalemia   . 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 Metabolic encephalopathy   . CVA (cerebral vascular accident)   . Dysphagia   . ST elevation (STEMI) myocardial infarction involving left anterior descending coronary artery   . Combined systolic and diastolic congestive heart failure   . Paroxysmal atrial flutter   . Hypokalemia   . Diabetes type 2, uncontrolled   . HLD (hyperlipidemia)   . Altered mental status   . Hyperglycemia   . Seizure   . Acute embolic stroke   . Acute respiratory failure with hypoxia 01/24/2015  . Acute renal failure syndrome   . Acute respiratory failure   . Hypertensive emergency 01/23/2015  . History of ST elevation myocardial infarction (STEMI) 01/23/2015  . Acute MI anterior wall first episode care   . Altered mental state    Rayetta Humphrey, [place] CLT [phone] 04/04/2015, 11:31 AM  [facility] [address] [place], [place], [address] Phone: [phone]   Fax:  [phone]

## 2015-04-05 ENCOUNTER — Other Ambulatory Visit: Payer: Self-pay | Admitting: Internal Medicine

## 2015-04-05 MED ORDER — INSULIN GLARGINE 100 UNIT/ML SOLOSTAR PEN: 24.0000 [IU] | PEN_INJECTOR | Freq: Every day | SUBCUTANEOUS | Status: DC

## 2015-04-05 MED ORDER — TICAGRELOR 90 MG PO TABS: 90.0000 mg | ORAL_TABLET | Freq: Two times a day (BID) | ORAL | Status: DC

## 2015-04-08 ENCOUNTER — Telehealth: Payer: Self-pay | Admitting: General Practice

## 2015-04-08 NOTE — Telephone Encounter (Signed)
Patient's wife calling to request medication refill for Insulin needles. Patient's wife states the needles had never been prescribed here before but that this is mow his PCP.  Please assist.

## 2015-04-09 ENCOUNTER — Telehealth: Payer: Self-pay

## 2015-04-09 MED ORDER — INSULIN SYRINGE 30G X 1/2" 0.5 ML MISC
Status: DC
Start: 2015-04-09 — End: 2017-11-16

## 2015-04-09 NOTE — Telephone Encounter (Signed)
Returned patient phone call Patient is requesting insulin syringes Prescription sent to [facility]

## 2015-04-12 ENCOUNTER — Ambulatory Visit: Payer: Medicaid Other | Attending: Internal Medicine | Admitting: *Deleted

## 2015-04-12 VITALS — BP 144/72 | HR 82 | Temp 98.4°F | Resp 18 | Ht 66.0 in | Wt 159.6 lb

## 2015-04-12 DIAGNOSIS — Z87891 Personal history of nicotine dependence: Secondary | ICD-10-CM | POA: Diagnosis not present

## 2015-04-12 DIAGNOSIS — Z794 Long term (current) use of insulin: Secondary | ICD-10-CM | POA: Diagnosis not present

## 2015-04-12 DIAGNOSIS — I10 Essential (primary) hypertension: Secondary | ICD-10-CM

## 2015-04-12 DIAGNOSIS — E1165 Type 2 diabetes mellitus with hyperglycemia: Secondary | ICD-10-CM | POA: Diagnosis not present

## 2015-04-12 NOTE — Patient Instructions (Addendum)
Type 2 Diabetes Mellitus Type 2 diabetes mellitus is a long-term (chronic) disease. In type 2 diabetes:  The pancreas does not make enough of a hormone called insulin.  The cells in the body do not respond as well to the insulin that is made.  Both of the above can happen. Normally, insulin moves sugars from food into tissue cells. This gives you energy. If you have type 2 diabetes, sugars cannot be moved into tissue cells. This causes high blood sugar (hyperglycemia).  HOME CARE  Have your hemoglobin A1c level checked twice a year. The level shows if your diabetes is under control or out of control.  Test your blood sugar level every day as told by your doctor.  Check your ketone levels by testing your pee (urine) when you are sick and as told.  Take your diabetes or insulin medicine as told by your doctor.  Never run out of insulin.  Adjust how much insulin you give yourself based on how many carbs (carbohydrates) you eat. Carbs are in many foods, such as fruits, vegetables, whole grains, and dairy products.  Have a healthy snack between every healthy meal. Have 3 meals and 3 snacks a day.  Lose weight if you are overweight.  Carry a medical alert card or wear your medical alert jewelry.  Carry a 15-gram carb snack with you at all times. Examples include:  Glucose pills, 3 or 4.  Glucose gel, 15-gram tube.  Raisins, 2 tablespoons (24 grams).  Jelly beans, 6.  Animal crackers, 8.  Regular (not diet) pop, 4 ounces (120 milliliters).  Gummy treats, 9.  Notice low blood sugar (hypoglycemia) symptoms, such as:  Shaking (tremors).  Trouble thinking clearly.  Sweating.  Faster heart rate.  Headache.  Dry mouth.  Hunger.  Crabbiness (irritability).  Being worried or tense (anxious).  Restless sleep.  A change in speech or coordination.  Confusion.  Treat low blood sugar right away. If you are alert and can swallow, follow the 15:15 rule:  Take 15-20  grams of a rapid-acting glucose or carb. This includes glucose gel, glucose pills, or 4 ounces (120 milliliters) of fruit juice, regular pop, or low-fat milk.  Check your blood sugar level 15 minutes after taking the glucose.  Take 15-20 grams more of glucose if the repeat blood sugar level is still 70 mg/dL (milligrams/deciliter) or below.  Eat a meal or snack within 1 hour of the blood sugar levels going back to normal.  Notice early symptoms of high blood sugar, such as:  Being really thirsty or drinking a lot (polydipsia).  Peeing a lot (polyuria).  Do at least 150 minutes of physical activity a week or as told.  Split the 150 minutes of activity up during the week. Do not do 150 minutes of activity in one day.  Perform exercises, such as weight lifting, at least 2 times a week or as told.  Spend no more than 90 minutes at one time inactive.  Adjust your insulin or food intake as needed if you start a new exercise or sport.  Follow your sick-day plan when you are not able to eat or drink as usual.  Do not smoke, chew tobacco, or use electronic cigarettes.  Women who are not pregnant should drink no more than 1 drink a day. Men should drink no more than 2 drinks a day.  Only drink alcohol with food.  Ask your doctor if alcohol is safe for you.  Tell your doctor if you  drink alcohol several times during the week.  See your doctor regularly.  Schedule an eye exam soon after you are told you have diabetes. Schedule exams once every year.  Check your skin and feet every day. Check for cuts, bruises, redness, nail problems, bleeding, blisters, or sores. A doctor should do a foot exam once a year.  Brush your teeth and gums twice a day. Floss once a day. Visit your dentist regularly.  Share your diabetes plan with your workplace or school.  Stay up-to-date with shots that fight against diseases (immunizations).  Learn how to deal with stress.  Get diabetes education and  support as needed.  Ask your doctor for special help if:  You need help to maintain or improve how you do things on your own.  You need help to maintain or improve the quality of your life.  You have foot or hand problems.  You have trouble cleaning yourself, dressing, eating, or doing physical activity. GET HELP IF:  You are unable to eat or drink for more than 6 hours.  You feel sick to your stomach (nauseous) or throw up (vomit) for more than 6 hours.  Your blood sugar level is over 240 mg/dL.  There is a change in mental status.  You get another serious illness.  You have watery poop (diarrhea) for more than 6 hours.  You have been sick or have had a fever for 2 or more days and are not getting better.  You have pain when you are active. GET HELP RIGHT AWAY IF:  You have trouble breathing.  Your ketone levels are higher than your doctor says they should be. MAKE SURE YOU:  Understand these instructions.  Will watch your condition.  Will get help right away if you are not doing well or get worse. Document Released: 06/16/2008 Document Revised: 01/22/2014 Document Reviewed: 04/08/2012 [facility] Patient Information 2015 [place], [place]. This information is not intended to replace advice given to you by your health care provider. Make sure you discuss any questions you have with your health care provider. Basic Carbohydrate Counting for Diabetes Mellitus Carbohydrate counting is a method for keeping track of the amount of carbohydrates you eat. Eating carbohydrates naturally increases the level of sugar (glucose) in your blood, so it is important for you to know the amount that is okay for you to have in every meal. Carbohydrate counting helps keep the level of glucose in your blood within normal limits. The amount of carbohydrates allowed is different for every person. A dietitian can help you calculate the amount that is right for you. Once you know the amount of  carbohydrates you can have, you can count the carbohydrates in the foods you want to eat. Carbohydrates are found in the following foods:  Grains, such as breads and cereals.  Dried beans and soy products.  Starchy vegetables, such as potatoes, peas, and corn.  Fruit and fruit juices.  Milk and yogurt.  Sweets and snack foods, such as cake, cookies, candy, chips, soft drinks, and fruit drinks. CARBOHYDRATE COUNTING There are two ways to count the carbohydrates in your food. You can use either of the methods or a combination of both. Reading the "Nutrition Facts" on [place] The "Nutrition Facts" is an area that is included on the labels of almost all packaged food and beverages in the [place]. It includes the serving size of that food or beverage and information about the nutrients in each serving of the food, including  the grams (g) of carbohydrate per serving.  Decide the number of servings of this food or beverage that you will be able to eat or drink. Multiply that number of servings by the number of grams of carbohydrate that is listed on the label for that serving. The total will be the amount of carbohydrates you will be having when you eat or drink this food or beverage. Learning Standard Serving Sizes of Food When you eat food that is not packaged or does not include "Nutrition Facts" on the label, you need to measure the servings in order to count the amount of carbohydrates.A serving of most carbohydrate-rich foods contains about 15 g of carbohydrates. The following list includes serving sizes of carbohydrate-rich foods that provide 15 g ofcarbohydrate per serving:   1 slice of bread (1 oz) or 1 six-inch tortilla.    of a hamburger bun or English muffin.  4-6 crackers.   cup unsweetened dry cereal.    cup hot cereal.   cup rice or pasta.    cup mashed potatoes or  of a large baked potato.  1 cup fresh fruit or one small piece of fruit.    cup  canned or frozen fruit or fruit juice.  1 cup milk.   cup plain fat-free yogurt or yogurt sweetened with artificial sweeteners.   cup cooked dried beans or starchy vegetable, such as peas, corn, or potatoes.  Decide the number of standard-size servings that you will eat. Multiply that number of servings by 15 (the grams of carbohydrates in that serving). For example, if you eat 2 cups of strawberries, you will have eaten 2 servings and 30 g of carbohydrates (2 servings x 15 g = 30 g). For foods such as soups and casseroles, in which more than one food is mixed in, you will need to count the carbohydrates in each food that is included. EXAMPLE OF CARBOHYDRATE COUNTING Sample Dinner  3 oz chicken breast.   cup of brown rice.   cup of corn.  1 cup milk.   1 cup strawberries with sugar-free whipped topping.  Carbohydrate Calculation Step 1: Identify the foods that contain carbohydrates:   Rice.   Corn.   Milk.   Strawberries. Step 2:Calculate the number of servings eaten of each:   2 servings of rice.   1 serving of corn.   1 serving of milk.   1 serving of strawberries. Step 3: Multiply each of those number of servings by 15 g:   2 servings of rice x 15 g = 30 g.   1 serving of corn x 15 g = 15 g.   1 serving of milk x 15 g = 15 g.   1 serving of strawberries x 15 g = 15 g. Step 4: Add together all of the amounts to find the total grams of carbohydrates eaten: 30 g + 15 g + 15 g + 15 g = 75 g. Document Released: 09/07/2005 Document Revised: 01/22/2014 Document Reviewed: 08/04/2013 [facility] Patient Information 2015 [place], [place]. This information is not intended to replace advice given to you by your health care provider. Make sure you discuss any questions you have with your health care provider. se. In type 2 diabetes:  The pancreas does not make enough of a hormone called insulin.  The cells in the body do not respond as well to the insulin  that is made.  Both of the above can happen. Normally, insulin moves sugars from food into tissue  cells. This gives you energy. If you have type 2 diabetes, sugars cannot be moved into tissue cells. This causes high blood sugar (hyperglycemia).  HOME CARE  Have your hemoglobin A1c level checked twice a year. The level shows if your diabetes is under control or out of control.  Test your blood sugar level every day as told by your doctor.  Check your ketone levels by testing your pee (urine) when you are sick and as told.  Take your diabetes or insulin medicine as told by your doctor.  Never run out of insulin.  Adjust how much insulin you give yourself based on how many carbs (carbohydrates) you eat. Carbs are in many foods, such as fruits, vegetables, whole grains, and dairy products.  Have a healthy snack between every healthy meal. Have 3 meals and 3 snacks a day.  Lose weight if you are overweight.  Carry a medical alert card or wear your medical alert jewelry.  Carry a 15-gram carb snack with you at all times. Examples include:  Glucose pills, 3 or 4.  Glucose gel, 15-gram tube.  Raisins, 2 tablespoons (24 grams).  Jelly beans, 6.  Animal crackers, 8.  Regular (not diet) pop, 4 ounces (120 milliliters).  Gummy treats, 9.  Notice low blood sugar (hypoglycemia) symptoms, such as:  Shaking (tremors).  Trouble thinking clearly.  Sweating.  Faster heart rate.  Headache.  Dry mouth.  Hunger.  Crabbiness (irritability).  Being worried or tense (anxious).  Restless sleep.  A change in speech or coordination.  Confusion.  Treat low blood sugar right away. If you are alert and can swallow, follow the 15:15 rule:  Take 15-20 grams of a rapid-acting glucose or carb. This includes glucose gel, glucose pills, or 4 ounces (120 milliliters) of fruit juice, regular pop, or low-fat milk.  Check your blood sugar level 15 minutes after taking the  glucose.  Take 15-20 grams more of glucose if the repeat blood sugar level is still 70 mg/dL (milligrams/deciliter) or below.  Eat a meal or snack within 1 hour of the blood sugar levels going back to normal.  Notice early symptoms of high blood sugar, such as:  Being really thirsty or drinking a lot (polydipsia).  Peeing a lot (polyuria).  Do at least 150 minutes of physical activity a week or as told.  Split the 150 minutes of activity up during the week. Do not do 150 minutes of activity in one day.  Perform exercises, such as weight lifting, at least 2 times a week or as told.  Spend no more than 90 minutes at one time inactive.  Adjust your insulin or food intake as needed if you start a new exercise or sport.  Follow your sick-day plan when you are not able to eat or drink as usual.  Do not smoke, chew tobacco, or use electronic cigarettes.  Women who are not pregnant should drink no more than 1 drink a day. Men should drink no more than 2 drinks a day.  Only drink alcohol with food.  Ask your doctor if alcohol is safe for you.  Tell your doctor if you drink alcohol several times during the week.  See your doctor regularly.  Schedule an eye exam soon after you are told you have diabetes. Schedule exams once every year.  Check your skin and feet every day. Check for cuts, bruises, redness, nail problems, bleeding, blisters, or sores. A doctor should do a foot exam once a year.  Brush  your teeth and gums twice a day. Floss once a day. Visit your dentist regularly.  Share your diabetes plan with your workplace or school.  Stay up-to-date with shots that fight against diseases (immunizations).  Learn how to deal with stress.  Get diabetes education and support as needed.  Ask your doctor for special help if:  You need help to maintain or improve how you do things on your own.  You need help to maintain or improve the quality of your life.  You have foot or  hand problems.  You have trouble cleaning yourself, dressing, eating, or doing physical activity. GET HELP IF:  You are unable to eat or drink for more than 6 hours.  You feel sick to your stomach (nauseous) or throw up (vomit) for more than 6 hours.  Your blood sugar level is over 240 mg/dL.  There is a change in mental status.  You get another serious illness.  You have watery poop (diarrhea) for more than 6 hours.  You have been sick or have had a fever for 2 or more days and are not getting better.  You have pain when you are active. GET HELP RIGHT AWAY IF:  You have trouble breathing.  Your ketone levels are higher than your doctor says they should be. MAKE SURE YOU:  Understand these instructions.  Will watch your condition.  Will get help right away if you are not doing well or get worse. Document Released: 06/16/2008 Document Revised: 01/22/2014 Document Reviewed: 04/08/2012 [facility] Patient Information 2015 [place], [place]. This information is not intended to replace advice given to you by your health care provider. Make sure you discuss any questions you have with your health care provider. DASH Eating Plan DASH stands for "Dietary Approaches to Stop Hypertension." The DASH eating plan is a healthy eating plan that has been shown to reduce high blood pressure (hypertension). Additional health benefits may include reducing the risk of type 2 diabetes mellitus, heart disease, and stroke. The DASH eating plan may also help with weight loss. WHAT DO I NEED TO KNOW ABOUT THE DASH EATING PLAN? For the DASH eating plan, you will follow these general guidelines:  Choose foods with a percent daily value for sodium of less than 5% (as listed on the food label).  Use salt-free seasonings or herbs instead of table salt or sea salt.  Check with your health care provider or pharmacist before using salt substitutes.  Eat lower-sodium products, often labeled as "lower sodium"  or "no salt added."  Eat fresh foods.  Eat more vegetables, fruits, and low-fat dairy products.  Choose whole grains. Look for the word "whole" as the first word in the ingredient list.  Choose fish and skinless chicken or [place] more often than red meat. Limit fish, poultry, and meat to 6 oz (170 g) each day.  Limit sweets, desserts, sugars, and sugary drinks.  Choose heart-healthy fats.  Limit cheese to 1 oz (28 g) per day.  Eat more home-cooked food and less restaurant, buffet, and fast food.  Limit fried foods.  Cook foods using methods other than frying.  Limit canned vegetables. If you do use them, rinse them well to decrease the sodium.  When eating at a restaurant, ask that your food be prepared with less salt, or no salt if possible. WHAT FOODS CAN I EAT? Seek help from a dietitian for individual calorie needs. Grains Whole grain or whole wheat bread. Brown rice. Whole grain or whole wheat pasta.  Quinoa, bulgur, and whole grain cereals. Low-sodium cereals. Corn or whole wheat flour tortillas. Whole grain cornbread. Whole grain crackers. Low-sodium crackers. Vegetables Fresh or frozen vegetables (raw, steamed, roasted, or grilled). Low-sodium or reduced-sodium tomato and vegetable juices. Low-sodium or reduced-sodium tomato sauce and paste. Low-sodium or reduced-sodium canned vegetables.  Fruits All fresh, canned (in natural juice), or frozen fruits. Meat and Other Protein Products Ground beef (85% or leaner), grass-fed beef, or beef trimmed of fat. Skinless chicken or [place]. Ground chicken or [place]. Pork trimmed of fat. All fish and seafood. Eggs. Dried beans, peas, or lentils. Unsalted nuts and seeds. Unsalted canned beans. Dairy Low-fat dairy products, such as skim or 1% milk, 2% or reduced-fat cheeses, low-fat ricotta or cottage cheese, or plain low-fat yogurt. Low-sodium or reduced-sodium cheeses. Fats and Oils Tub margarines without trans fats. Light or  reduced-fat mayonnaise and salad dressings (reduced sodium). Avocado. Safflower, olive, or canola oils. Natural peanut or almond butter. Other Unsalted popcorn and pretzels. The items listed above may not be a complete list of recommended foods or beverages. Contact your dietitian for more options. WHAT FOODS ARE NOT RECOMMENDED? Grains White bread. White pasta. White rice. Refined cornbread. Bagels and croissants. Crackers that contain trans fat. Vegetables Creamed or fried vegetables. Vegetables in a cheese sauce. Regular canned vegetables. Regular canned tomato sauce and paste. Regular tomato and vegetable juices. Fruits Dried fruits. Canned fruit in light or heavy syrup. Fruit juice. Meat and Other Protein Products Fatty cuts of meat. Ribs, chicken wings, bacon, sausage, bologna, salami, chitterlings, fatback, hot dogs, bratwurst, and packaged luncheon meats. Salted nuts and seeds. Canned beans with salt. Dairy Whole or 2% milk, cream, half-and-half, and cream cheese. Whole-fat or sweetened yogurt. Full-fat cheeses or blue cheese. Nondairy creamers and whipped toppings. Processed cheese, cheese spreads, or cheese curds. Condiments Onion and garlic salt, seasoned salt, table salt, and sea salt. Canned and packaged gravies. Worcestershire sauce. Tartar sauce. Barbecue sauce. Teriyaki sauce. Soy sauce, including reduced sodium. Steak sauce. Fish sauce. Oyster sauce. Cocktail sauce. Horseradish. Ketchup and mustard. Meat flavorings and tenderizers. Bouillon cubes. Hot sauce. Tabasco sauce. Marinades. Taco seasonings. Relishes. Fats and Oils Butter, stick margarine, lard, shortening, ghee, and bacon fat. Coconut, palm kernel, or palm oils. Regular salad dressings. Other Pickles and olives. Salted popcorn and pretzels. The items listed above may not be a complete list of foods and beverages to avoid. Contact your dietitian for more information. WHERE CAN I FIND MORE INFORMATION? National Heart,  Lung, and Blood Institute: travelstabloid.com Document Released: 08/27/2011 Document Revised: 01/22/2014 Document Reviewed: 07/12/2013 [facility] Patient Information 2015 [place], [place]. This information is not intended to replace advice given to you by your health care provider. Make sure you discuss any questions you have with your health care provider.

## 2015-04-12 NOTE — Progress Notes (Signed)
Patient presents with wife for BP check, CBG and record review for T2DM Patient is wearing Life Vest Med list reviewed; patient reports taking all meds as directed Taking 24 units lantus q AM. States recently started on hydralazine 25 mg tid by cardiology office  Also taking carvedilol 25 mg bid  Patient is not adding salt to foods or cooking with salt. Patient made aware of Mrs Deliah Boston as alternative to salt. Encouraged patient to choose foods with 5% or less of daily value for sodium. Discussed walking 30 minutes per day for exercise in 10-15 minute increments;  At minimum getting up from sitting position and walking around every hour Patient denies increased thirst and urination Denies headaches, blurred vision, SHOB, chest pain  Denies LE edema C/o photosensitivity X 2-3 weeks, wears "shades" and hat when outside Patient's AM fasting blood sugars ranging 84-150 (1 outlier at 190) Patient's before lunch blood sugars ranging 160 and 162 (2 readings) Patient's before dinner blood sugars ranging 100-178 (5 readings) Patient's before bed blood sugars ranging 96 (1 reading) Patient keeping weight log: 158-161 lbs (160 lb 10/15 days including last 4 days) Also keeping BP: log 139-170 / 75-93  CBG 97 AM fasting per patient record. Ate 1 egg, cheese and 1 slice of bread 1 hour ago so CBG not repeated   Lab Results  Component Value Date   HGBA1C 9.0 02/15/2015   Filed Vitals:   04/12/15 1133  BP: 144/72  Pulse: 82  Temp: 98.4 F (36.9 C)  Resp: 18     Per PCP: No changes to meds at this time  Continue checking BS before breakfast and before dinner F/u with [facility] as directed  Patient aware that he is to f/u with PCP 3 months from last visit. Due 06/27/2015  Patient given literature on DASH Eating Plan, T2DM, and Basic Carb Counting

## 2015-04-17 ENCOUNTER — Telehealth: Payer: Self-pay | Admitting: Cardiology

## 2015-04-17 NOTE — Telephone Encounter (Signed)
Per wife    Pt would like a call back for an earlier appointment than Oct please.

## 2015-04-19 NOTE — Telephone Encounter (Signed)
Pt has an appt in Heart Failure 04/25/15--LMTCB for Dora to confirm she knew about appt.

## 2015-04-25 ENCOUNTER — Encounter (HOSPITAL_COMMUNITY): Payer: Self-pay

## 2015-04-25 ENCOUNTER — Ambulatory Visit (HOSPITAL_COMMUNITY)
Admission: RE | Admit: 2015-04-25 | Discharge: 2015-04-25 | Disposition: A | Discharge status: Home / Self Care | Payer: Medicaid Other | Source: Ambulatory Visit | Attending: Cardiology | Admitting: Cardiology

## 2015-04-25 VITALS — BP 126/68 | HR 86 | Wt 158.8 lb

## 2015-04-25 DIAGNOSIS — E119 Type 2 diabetes mellitus without complications: Secondary | ICD-10-CM | POA: Insufficient documentation

## 2015-04-25 DIAGNOSIS — F1010 Alcohol abuse, uncomplicated: Secondary | ICD-10-CM | POA: Diagnosis not present

## 2015-04-25 DIAGNOSIS — I252 Old myocardial infarction: Secondary | ICD-10-CM | POA: Insufficient documentation

## 2015-04-25 DIAGNOSIS — E785 Hyperlipidemia, unspecified: Secondary | ICD-10-CM | POA: Diagnosis not present

## 2015-04-25 DIAGNOSIS — Z7982 Long term (current) use of aspirin: Secondary | ICD-10-CM | POA: Insufficient documentation

## 2015-04-25 DIAGNOSIS — I5022 Chronic systolic (congestive) heart failure: Secondary | ICD-10-CM | POA: Insufficient documentation

## 2015-04-25 DIAGNOSIS — Z8673 Personal history of transient ischemic attack (TIA), and cerebral infarction without residual deficits: Secondary | ICD-10-CM | POA: Diagnosis not present

## 2015-04-25 DIAGNOSIS — I2510 Atherosclerotic heart disease of native coronary artery without angina pectoris: Secondary | ICD-10-CM | POA: Insufficient documentation

## 2015-04-25 DIAGNOSIS — I10 Essential (primary) hypertension: Secondary | ICD-10-CM | POA: Diagnosis not present

## 2015-04-25 DIAGNOSIS — Z79899 Other long term (current) drug therapy: Secondary | ICD-10-CM | POA: Insufficient documentation

## 2015-04-25 DIAGNOSIS — I255 Ischemic cardiomyopathy: Secondary | ICD-10-CM | POA: Insufficient documentation

## 2015-04-25 DIAGNOSIS — I4892 Unspecified atrial flutter: Secondary | ICD-10-CM

## 2015-04-25 DIAGNOSIS — Z955 Presence of coronary angioplasty implant and graft: Secondary | ICD-10-CM | POA: Diagnosis not present

## 2015-04-25 DIAGNOSIS — K219 Gastro-esophageal reflux disease without esophagitis: Secondary | ICD-10-CM | POA: Insufficient documentation

## 2015-04-25 DIAGNOSIS — I480 Paroxysmal atrial fibrillation: Secondary | ICD-10-CM | POA: Diagnosis not present

## 2015-04-25 DIAGNOSIS — Z8249 Family history of ischemic heart disease and other diseases of the circulatory system: Secondary | ICD-10-CM | POA: Insufficient documentation

## 2015-04-25 DIAGNOSIS — R0989 Other specified symptoms and signs involving the circulatory and respiratory systems: Secondary | ICD-10-CM | POA: Insufficient documentation

## 2015-04-25 DIAGNOSIS — I5042 Chronic combined systolic (congestive) and diastolic (congestive) heart failure: Secondary | ICD-10-CM

## 2015-04-25 LAB — LIPID PANEL
Cholesterol: 87 mg/dL (ref 0–200)
HDL: 29 mg/dL — AB (ref >40)
LDL Cholesterol: 41 mg/dL (ref 0–99)
Total CHOL/HDL Ratio: 3 RATIO
Triglycerides: 83 mg/dL (ref <150)
VLDL: 17 mg/dL (ref 0–40)

## 2015-04-25 LAB — BASIC METABOLIC PANEL
Anion gap: 8 (ref 5–15)
BUN: 24 mg/dL — ABNORMAL HIGH (ref 6–20)
CALCIUM: 9.8 mg/dL (ref 8.9–10.3)
CO2: 28 mmol/L (ref 22–32)
CREATININE: 1.43 mg/dL — AB (ref 0.61–1.24)
Chloride: 103 mmol/L (ref 101–111)
GFR calc Af Amer: >60 mL/min (ref >60)
GFR calc non Af Amer: 53 mL/min — ABNORMAL LOW (ref >60)
Glucose, Bld: 194 mg/dL — ABNORMAL HIGH (ref 65–99)
Potassium: 4.4 mmol/L (ref 3.5–5.1)
SODIUM: 139 mmol/L (ref 135–145)

## 2015-04-25 MED ORDER — ISOSORB DINITRATE-HYDRALAZINE 20-37.5 MG PO TABS: 1.0000 | ORAL_TABLET | Freq: Three times a day (TID) | ORAL | Status: DC

## 2015-04-25 NOTE — Patient Instructions (Signed)
Stop Hydralazine   Start Bidil 1 tab Three times a day, we have provided you with samples and will set you up in the patient assistance program  Labs today  Your physician has requested that you have a carotid duplex. This test is an ultrasound of the carotid arteries in your neck. It looks at blood flow through these arteries that supply the brain with blood. Allow one hour for this exam. There are no restrictions or special instructions.  Your physician recommends that you schedule a follow-up appointment in: 6 weeks

## 2015-04-27 NOTE — Progress Notes (Signed)
Patient ID: AIJALON WINGETT, male   DOB: 1955/12/01, 59 y.o.   MRN: [id number] PCP: Dr. Feliciana Rossetti  59 yo with history of prior ETOH abuse, DM, HTN, CAD s/p anterior STEMI 5/16, paroxysmal atrial fibrillation, and CVA presents for CHF clinic followup.  Patient was admitted to [facility] in 5/16 with an ETOH withdrawal seizure.  He was also noted to have anterior ST elevation and was sent for cath.  This showed diffuse disease with culprit being 100% mLAD stenosis.  He had DES to mLAD.  Echo showed EF 30-35%.  Post-PCI, he had atrial fibrillation and had a CVA.  He has no residual deficits from the CVA.  He is currently on ASA 81 and Brilinta.  He was thought to be a poor candidate for anticoagulation given history of ETOH abuse.  He was readmitted with AKI and dehydration.  ACEI was stopped.    He is currently stable.  He does not drink or smoke anymore, wife corroborates this.  He is wearing a Lifevest.  No chest pain.  No exertional dyspnea though not very active.  No orthopnea/PND.   ECG: NSR, old anterior MI  Labs (7/16): K 4, creatinine 1.06, HCT 31.6  PMH: 1. Type II diabetes 2. HTN 3. H/o heavy ETOH abuse: Has now quit. H/o withdrawal seizure. 4. GERD 5. CVA 02/19/15: Related to atrial fibrillation.  6. ACKI with ACEI 7. CAD: Presented to [facility] with anterior STEMI 5/16.  LHC with 50% LM, 99% mLAD, 90% D1, 75% ramus, 70% mRCA.  Patient had DES to mLAD.   8. Ischemic cardiomyopathy: Echo (5/16) with EF 30-35% with wall motion abnormalities, normal RV size and systolic function.  9. Atrial fibrillation: Paroxysmal.  Noted after STEMI in 5/16.  Thought to be poor anticoagulation candidate.   SH: Married, prior heavy ETOH (has quit).  Prior smoker (has quit).  Lives in [place].    FH: CAD  ROS: All systems reviewed and negative except as per HPI.   Current Outpatient Prescriptions  Medication Sig Dispense Refill  . aspirin 81 MG chewable tablet Chew 1 tablet (81 mg total) by mouth daily.     Marland Kitchen atorvastatin (LIPITOR) 80 MG tablet Take 1 tablet (80 mg total) by mouth daily at 6 PM. 30 tablet 1  . carvedilol (COREG) 25 MG tablet Take 1 tablet (25 mg total) by mouth 2 (two) times daily with a meal. 60 tablet 1  . folic acid (FOLVITE) 1 MG tablet Take 1 tablet (1 mg total) by mouth daily. 30 tablet 1  . glucose blood (TRUE METRIX BLOOD GLUCOSE TEST) test strip Use as instructed 100 each 12  . Insulin Glargine (LANTUS SOLOSTAR) 100 UNIT/ML Solostar Pen Inject 24 Units into the skin daily at 10 pm. 30 mL 3  . Insulin Syringe-Needle U-100 (INSULIN SYRINGE .5CC/30GX1/2") 30G X 1/2" 0.5 ML MISC Check blood sugar TID & QHS 100 each 2  . lactulose (CHRONULAC) 10 GM/15ML solution Take 15 mLs (10 g total) by mouth 3 (three) times daily. 946 mL 1  . levETIRAcetam (KEPPRA) 100 MG/ML solution Take 5 mLs (500 mg total) by mouth every 12 (twelve) hours. 473 mL 12  . ticagrelor (BRILINTA) 90 MG TABS tablet Take 1 tablet (90 mg total) by mouth 2 (two) times daily. 180 tablet 3  . isosorbide-hydrALAZINE (BIDIL) 20-37.5 MG per tablet Take 1 tablet by mouth 3 (three) times daily. 90 tablet 12   No current facility-administered medications for this encounter.   BP 126/68 mmHg  Pulse 86  Wt 158 lb 12.8 oz (72.031 kg)  SpO2 98% General: NAD Neck: No JVD, no thyromegaly or thyroid nodule.  Lungs: Clear to auscultation bilaterally with normal respiratory effort. CV: Nondisplaced PMI.  Heart regular S1/S2, no S3/S4, no murmur.  No peripheral edema.  Left carotid bruit.  Normal pedal pulses.  Abdomen: Soft, nontender, no hepatosplenomegaly, no distention.  Skin: Intact without lesions or rashes.  Neurologic: Alert and oriented x 3.  Psych: Normal affect. Extremities: No clubbing or cyanosis.  HEENT: Normal.   Assessment/Plan: 1. CAD: s/p anterior STEMI with DES to mLAD in 5/16.  He has residual significant disease (see description of cath in [facility]) but has had no chest pain and is doing well.   -  Recommend cardiac rehab at [facility] - Continue ASA 81, Coreg, Brilinta 90 bid, and atorvastatin 80 mg daily.  2. Hyperlipidemia: Check lipids today, goal LDL < 70.  3. Chronic systolic CHF: Ischemic CMP, EF 30-35% in 5/16.  He is not volume overloaded.  NYHA class I-II symptoms though not very active.  - d/c hydralazine, start Bidil 1 tab tid.  - Hold off on ACEI for now with recent AKI.  Most recent creatinine was 1.06 so may be able to start low dose in the future.  Check BMET today.  - Continue Coreg - Repeat echo at the end of this month (3 months post-PCI).  If EF remains < 35%, he will need ICD.  Continue Lifevest until this is determined.   4. Left carotid bruit: Arrange carotid US.  5. Atrial fibrillation: Paroxysmal, noted after MI.  Of note, patient had a CVA likely related to atrial fibrillation.  No residual deficits. He was deemed a poor anticoagulation candidate due to need for ASA/Brilinta and ETOH abuse.  He has quit drinking since and may be a candidate for anticoagulation in the future.  Would not have him on both anticoagulant and Brilinta.  6. ETOH abuse: Congratulated on abstinence.   Loralie Champagne 04/27/2015

## 2015-04-29 ENCOUNTER — Other Ambulatory Visit (HOSPITAL_COMMUNITY): Payer: Self-pay

## 2015-04-29 ENCOUNTER — Other Ambulatory Visit: Payer: Self-pay

## 2015-04-29 DIAGNOSIS — IMO0002 Reserved for concepts with insufficient information to code with codable children: Secondary | ICD-10-CM

## 2015-04-29 DIAGNOSIS — E1165 Type 2 diabetes mellitus with hyperglycemia: Secondary | ICD-10-CM

## 2015-04-29 MED ORDER — TRUEPLUS LANCETS 28G MISC
1.0000 | Freq: Three times a day (TID) | Status: DC
Start: 2015-04-29 — End: 2018-02-15

## 2015-04-30 ENCOUNTER — Ambulatory Visit: Payer: Self-pay | Admitting: Physical Medicine & Rehabilitation

## 2015-04-30 ENCOUNTER — Ambulatory Visit: Payer: Self-pay

## 2015-05-02 ENCOUNTER — Ambulatory Visit (HOSPITAL_COMMUNITY)
Admission: RE | Admit: 2015-05-02 | Discharge: 2015-05-02 | Disposition: A | Discharge status: Home / Self Care | Payer: Medicaid Other | Source: Ambulatory Visit | Attending: Cardiology | Admitting: Cardiology

## 2015-05-02 ENCOUNTER — Ambulatory Visit (HOSPITAL_BASED_OUTPATIENT_CLINIC_OR_DEPARTMENT_OTHER): Payer: Medicaid Other

## 2015-05-02 ENCOUNTER — Other Ambulatory Visit: Payer: Self-pay

## 2015-05-02 DIAGNOSIS — I10 Essential (primary) hypertension: Secondary | ICD-10-CM | POA: Insufficient documentation

## 2015-05-02 DIAGNOSIS — I6523 Occlusion and stenosis of bilateral carotid arteries: Secondary | ICD-10-CM | POA: Insufficient documentation

## 2015-05-02 DIAGNOSIS — I340 Nonrheumatic mitral (valve) insufficiency: Secondary | ICD-10-CM | POA: Diagnosis not present

## 2015-05-02 DIAGNOSIS — E785 Hyperlipidemia, unspecified: Secondary | ICD-10-CM | POA: Diagnosis not present

## 2015-05-02 DIAGNOSIS — I2109 ST elevation (STEMI) myocardial infarction involving other coronary artery of anterior wall: Secondary | ICD-10-CM

## 2015-05-02 DIAGNOSIS — R0989 Other specified symptoms and signs involving the circulatory and respiratory systems: Secondary | ICD-10-CM | POA: Diagnosis not present

## 2015-05-02 DIAGNOSIS — Z955 Presence of coronary angioplasty implant and graft: Secondary | ICD-10-CM

## 2015-05-02 DIAGNOSIS — E119 Type 2 diabetes mellitus without complications: Secondary | ICD-10-CM | POA: Diagnosis not present

## 2015-05-02 DIAGNOSIS — N189 Chronic kidney disease, unspecified: Secondary | ICD-10-CM

## 2015-05-02 DIAGNOSIS — I358 Other nonrheumatic aortic valve disorders: Secondary | ICD-10-CM | POA: Diagnosis not present

## 2015-05-13 ENCOUNTER — Ambulatory Visit (HOSPITAL_BASED_OUTPATIENT_CLINIC_OR_DEPARTMENT_OTHER): Payer: Medicaid Other | Admitting: Physical Medicine & Rehabilitation

## 2015-05-13 ENCOUNTER — Encounter: Payer: Self-pay | Admitting: Physical Medicine & Rehabilitation

## 2015-05-13 ENCOUNTER — Encounter: Payer: Medicaid Other | Attending: Physical Medicine & Rehabilitation

## 2015-05-13 VITALS — BP 161/84 | HR 74

## 2015-05-13 DIAGNOSIS — R269 Unspecified abnormalities of gait and mobility: Secondary | ICD-10-CM | POA: Diagnosis not present

## 2015-05-13 DIAGNOSIS — Z87891 Personal history of nicotine dependence: Secondary | ICD-10-CM | POA: Insufficient documentation

## 2015-05-13 DIAGNOSIS — I69398 Other sequelae of cerebral infarction: Secondary | ICD-10-CM | POA: Insufficient documentation

## 2015-05-13 DIAGNOSIS — I6931 Cognitive deficits following cerebral infarction: Secondary | ICD-10-CM | POA: Insufficient documentation

## 2015-05-13 DIAGNOSIS — I69319 Unspecified symptoms and signs involving cognitive functions following cerebral infarction: Secondary | ICD-10-CM

## 2015-05-13 NOTE — Progress Notes (Signed)
Subjective:    Patient ID: Timothy Lambert, male    DOB: 11/27/1955, 59 y.o.   MRN: [id number] 59 year old right-handed male with history of hypertension, alcohol abuse, diabetes mellitus and coronary artery disease, maintained on aspirin therapy.  He lives with his wife and independent.  He presented to [facility] on Jan 23, 2015, with seizures.  Extremely agitated required intubation.  Alcohol level and drug screen negative.  CT of the head negative.  EKG with precordial ST changes concerning for ischemia and infarct.  Transferred to [facility] for ongoing care.  Repeat EKG showed worsened ST elevation, T-wave inversions, troponin greater than 65 DATE OF ADMISSION:  02/01/2015 DATE OF DISCHARGE:  02/14/2015 HPI Patient returns today he's had no new medical problems.  No falls at home. He's had no further seizures. He has followed up with cardiology. Still has a life vest on.                              Pain Inventory Average Pain 0 Pain Right Now 0 My pain is no pain  In the last 24 hours, has pain interfered with the following? General activity 0 Relation with others 0 Enjoyment of life 0 What TIME of day is your pain at its worst? no pain Sleep (in general) Good  Pain is worse with: no pain Pain improves with: no pain Relief from Meds: no pain  Mobility walk without assistance how many minutes can you walk? 20 ability to climb steps?  yes do you drive?  no  Function disabled: date disabled 01/21/15  Neuro/Psych No problems in this area  Prior Studies Any changes since last visit?  no  Physicians involved in your care Any changes since last visit?  no   Family History  Problem Relation Age of Onset  . Diabetes Father    Social History   Social History  . Marital Status: Married    Spouse Name: N/A  . Number of Children: N/A  . Years of Education: N/A   Social History Main Topics  . Smoking status: Former Smoker -- 0.33 packs/day  for 14 years    Types: Cigarettes  . Smokeless tobacco: Never Used     Comment: "quit smoking cigarettes in 1992"  . Alcohol Use: No     Comment: 02/19/2015 "stopped drinking 01/22/2015"  . Drug Use: No  . Sexual Activity: Not Currently   Other Topics Concern  . None   Social History Narrative   Past Surgical History  Procedure Laterality Date  . Cardiac catheterization N/A 01/23/2015    Procedure: Left Heart Cath and Coronary Angiography;  Surgeon: [name], MD;  Location: [facility] INVASIVE CV LAB CUPID;  Service: Cardiovascular;  Laterality: N/A;  . Cardiac catheterization N/A 01/23/2015    Procedure: Coronary Stent Intervention;  Surgeon: [name], MD;  Location: [facility] INVASIVE CV LAB CUPID;  Service: Cardiovascular;  Laterality: N/A;   Past Medical History  Diagnosis Date  . Coronary artery disease   . Hypertension   . HLD (hyperlipidemia)   . GERD (gastroesophageal reflux disease)   . Refusal of blood transfusions as patient is Jehovah's Witness   . CHF (congestive heart failure)   . Myocardial infarction 01/22/2015    "massive"  . Type II diabetes mellitus   . Seizures 01/22/2015    "in front of paramedics"  . Stroke 01/22/2015    "short term memory loss & right  side weak since" (02/19/2015)  . Acute renal failure (ARF) 02/19/2015   BP 161/84 mmHg  Pulse 74  SpO2 100%  Opioid Risk Score:   Fall Risk Score:  `1  Depression screen PHQ 2/9  Depression screen [facility] 2/9 05/13/2015 03/27/2015 03/19/2015 03/04/2015 02/25/2015 02/15/2015  Decreased Interest 0 0 0 0 0 0  Down, Depressed, Hopeless 0 0 0 0 2 0  PHQ - 2 Score 0 0 0 0 2 0  Altered sleeping - - 0 - 0 -  Tired, decreased energy - - 2 - 2 -  Change in appetite - - 0 - 0 -  Feeling bad or failure about yourself  - - 0 - 0 -  Trouble concentrating - - 0 - 0 -  Moving slowly or fidgety/restless - - 1 - 2 -  Suicidal thoughts - - 0 - 0 -  PHQ-9 Score - - 3 - 6 -     Review of Systems  Cardiovascular:       Wearing life  vest  All other systems reviewed and are negative.      Objective:   Physical Exam Romberg has increased sway but no fall. He has 5/5 strength bilateral hip flexors and knee extensors ankle dorsiflexors as well as bilateral deltoid, biceps, triceps, grip Sensation intact to light touch bilateral upper and lower limbs Tone is normal bilateral upper and lower extremities Ambulation he has no evidence of toe drag or knee instability. He does have some difficulty with tandem gait is able to do toe walk as well as heel walk.  Orientation as to person and city but not office, he is not oriented to month but he is oriented to day        Assessment & Plan:  1. Bilateral cerebral infarcts cardioembolic. He's had balance issues as well as cognitive issues including decreased orientation decrease in memory decreased attention.  Also has had seizures onset 01/23/2015.  I don't think any further physical therapy will be needed. He will need to see a neurologist in regards to his seizure disorder as well as his cognition. I do expect his cognition to clear over the next 6-12 months. He may not get back to his baseline however.  I recommend no driving at the current time until reevaluated by neurology. This can wait until November given that by state law he is unable to drive until November given his history of seizure

## 2015-05-13 NOTE — Patient Instructions (Signed)
Please asked the neurologist how long he needs to stay on the [place]. By law you are not allowed to drive for at least 6 months after a seizure. The neurologist has to clear you to resume driving. He will see me on an as-needed basis. Continue to see your cardiologists As well as your primary doctor

## 2015-05-17 ENCOUNTER — Telehealth: Payer: Self-pay | Admitting: Internal Medicine

## 2015-05-17 NOTE — Telephone Encounter (Signed)
Patient called requesting a medication refill for  atorvastatin (LIPITOR) Please follow up.

## 2015-06-06 ENCOUNTER — Encounter (HOSPITAL_COMMUNITY): Payer: Self-pay | Admitting: *Deleted

## 2015-06-06 ENCOUNTER — Ambulatory Visit (HOSPITAL_COMMUNITY)
Admission: RE | Admit: 2015-06-06 | Discharge: 2015-06-06 | Disposition: A | Discharge status: Home / Self Care | Payer: Medicaid Other | Source: Ambulatory Visit | Attending: Internal Medicine | Admitting: Internal Medicine

## 2015-06-06 VITALS — BP 178/88 | HR 79 | Wt 158.0 lb

## 2015-06-06 DIAGNOSIS — I2109 ST elevation (STEMI) myocardial infarction involving other coronary artery of anterior wall: Secondary | ICD-10-CM | POA: Diagnosis not present

## 2015-06-06 DIAGNOSIS — F1010 Alcohol abuse, uncomplicated: Secondary | ICD-10-CM | POA: Insufficient documentation

## 2015-06-06 DIAGNOSIS — R0989 Other specified symptoms and signs involving the circulatory and respiratory systems: Secondary | ICD-10-CM | POA: Diagnosis not present

## 2015-06-06 DIAGNOSIS — I480 Paroxysmal atrial fibrillation: Secondary | ICD-10-CM | POA: Diagnosis not present

## 2015-06-06 DIAGNOSIS — I5022 Chronic systolic (congestive) heart failure: Secondary | ICD-10-CM | POA: Insufficient documentation

## 2015-06-06 DIAGNOSIS — I5042 Chronic combined systolic (congestive) and diastolic (congestive) heart failure: Secondary | ICD-10-CM | POA: Diagnosis not present

## 2015-06-06 DIAGNOSIS — I2510 Atherosclerotic heart disease of native coronary artery without angina pectoris: Secondary | ICD-10-CM | POA: Insufficient documentation

## 2015-06-06 DIAGNOSIS — E785 Hyperlipidemia, unspecified: Secondary | ICD-10-CM | POA: Insufficient documentation

## 2015-06-06 LAB — BASIC METABOLIC PANEL
ANION GAP: 7 (ref 5–15)
BUN: 23 mg/dL — ABNORMAL HIGH (ref 6–20)
CALCIUM: 10.3 mg/dL (ref 8.9–10.3)
CO2: 29 mmol/L (ref 22–32)
Chloride: 107 mmol/L (ref 101–111)
Creatinine, Ser: 1.38 mg/dL — ABNORMAL HIGH (ref 0.61–1.24)
GFR calc Af Amer: >60 mL/min (ref >60)
GFR, EST NON AFRICAN AMERICAN: 55 mL/min — AB (ref >60)
Glucose, Bld: 160 mg/dL — ABNORMAL HIGH (ref 65–99)
Potassium: 4 mmol/L (ref 3.5–5.1)
SODIUM: 143 mmol/L (ref 135–145)

## 2015-06-06 MED ORDER — LISINOPRIL 5 MG PO TABS: 5.0000 mg | ORAL_TABLET | Freq: Every day | ORAL | Status: DC

## 2015-06-06 MED ORDER — ATORVASTATIN CALCIUM 80 MG PO TABS: 80.0000 mg | ORAL_TABLET | Freq: Every day | ORAL | Status: DC

## 2015-06-06 NOTE — Progress Notes (Signed)
Pt applied and was approved for the Bidil Pt Asst Program to receive medication for free from the company, a 4 month supply of Bidil has arrived at our office, will give to pt today at his appt

## 2015-06-06 NOTE — Patient Instructions (Signed)
Start Lisinopril 5 mg daily  Labs today  Labs in 2 weeks  Ok to increase activity as able (tolerated)  We will contact you in 3 months to schedule your next appointment.

## 2015-06-06 NOTE — Progress Notes (Signed)
Patient ID: Timothy Lambert, male   DOB: 06-14-1956, 59 y.o.   MRN: [id number] PCP: Dr. Feliciana Rossetti HF: Dr. Aundra Dubin   59 yo with history of prior ETOH abuse, DM, HTN, CAD s/p anterior STEMI 5/16, paroxysmal atrial fibrillation, and CVA presents for CHF clinic followup.  Patient was admitted to [facility] in 5/16 with an ETOH withdrawal seizure.  He was also noted to have anterior ST elevation and was sent for cath.  This showed diffuse disease with culprit being 100% mLAD stenosis.  He had DES to mLAD.  Echo showed EF 30-35%.  Post-PCI, he had atrial fibrillation and had a CVA.  He has no residual deficits from the CVA.  He is currently on ASA 81 and Brilinta.  He was thought to be a poor candidate for anticoagulation given history of ETOH abuse.  He was readmitted with AKI and dehydration.  ACEI was stopped.    He is currently stable.  Has continued abstinence from alcohol and smoking. He is still wearing a Lifevest.  No DOE or CP. No orthopnea/PND. He is still not very active. Says his wife keeps a pretty tight leash on him.  Wants to know how much he can do.  Blood pressure high today.  Has not taken his medication today. SBPs at home 130-140. Works at [employer] and wants to know when he can go back to work.  Echo in 8/16 showed improvement in EF to 50-55%.   Labs (7/16): K 4, creatinine 1.06, HCT 31.6 Labs (8/16) K 4.4, creatinine 1.43, LDL 41, HDL 29  PMH: 1. Type II diabetes 2. HTN 3. H/o heavy ETOH abuse: Has now quit. H/o withdrawal seizure. 4. GERD 5. CVA 02/19/15: Related to atrial fibrillation.  6. AKI with ACEI 7. CAD: Presented to [facility] with anterior STEMI 5/16.  LHC with 50% LM, 99% mLAD, 90% D1, 75% ramus, 70% mRCA.  Patient had DES to mLAD.   8. Ischemic cardiomyopathy: Echo (5/16) with EF 30-35% with wall motion abnormalities, normal RV size and systolic function. Echo (8/16) with EF 50-55%, mild mid-apical anteroseptal HK.  9. Atrial fibrillation: Paroxysmal.  Noted after STEMI in 5/16.   Thought to be poor anticoagulation candidate.  10. Carotid dopplers (8/16) with minimal carotid disease.   SH: Married, prior heavy ETOH (has quit).  Prior smoker (has quit).  Lives in [place].    FH: CAD  ROS: All systems reviewed and negative except as per HPI.   Current Outpatient Prescriptions  Medication Sig Dispense Refill  . aspirin 81 MG chewable tablet Chew 1 tablet (81 mg total) by mouth daily.    Marland Kitchen atorvastatin (LIPITOR) 80 MG tablet Take 1 tablet (80 mg total) by mouth daily at 6 PM. 30 tablet 1  . carvedilol (COREG) 25 MG tablet Take 1 tablet (25 mg total) by mouth 2 (two) times daily with a meal. 60 tablet 1  . folic acid (FOLVITE) 1 MG tablet Take 1 tablet (1 mg total) by mouth daily. 30 tablet 1  . glucose blood (TRUE METRIX BLOOD GLUCOSE TEST) test strip Use as instructed 100 each 12  . Insulin Glargine (LANTUS SOLOSTAR) 100 UNIT/ML Solostar Pen Inject 24 Units into the skin daily at 10 pm. 30 mL 3  . Insulin Syringe-Needle U-100 (INSULIN SYRINGE .5CC/30GX1/2") 30G X 1/2" 0.5 ML MISC Check blood sugar TID & QHS 100 each 2  . isosorbide-hydrALAZINE (BIDIL) 20-37.5 MG per tablet Take 1 tablet by mouth 3 (three) times daily. 90 tablet 12  .  lactulose (CHRONULAC) 10 GM/15ML solution Take 15 mLs (10 g total) by mouth 3 (three) times daily. 946 mL 1  . levETIRAcetam (KEPPRA) 100 MG/ML solution Take 5 mLs (500 mg total) by mouth every 12 (twelve) hours. 473 mL 12  . ticagrelor (BRILINTA) 90 MG TABS tablet Take 1 tablet (90 mg total) by mouth 2 (two) times daily. 180 tablet 3  . TRUEPLUS LANCETS 28G MISC 1 each by Does not apply route 3 (three) times daily before meals. 100 each 12   No current facility-administered medications for this encounter.   BP 178/88 mmHg  Pulse 79  Wt 158 lb (71.668 kg)  SpO2 100%   General: NAD Neck: No JVD, no thyromegaly or thyroid nodule.  Lungs: Clear to auscultation bilaterally with normal respiratory effort. Life vest in place. CV:  Nondisplaced PMI.  Heart regular S1/S2, no S3/S4, no murmur.  No edema.  Left carotid bruit.  Normal pedal pulses.  Abdomen: Soft, NT, ND, no HSM. Skin: Intact without lesions or rashes.  Neurologic: Alert and oriented x 3.  Psych: Normal affect. Extremities: No clubbing or cyanosis.  HEENT: Normal.   Assessment/Plan: 1. CAD: s/p anterior STEMI with DES to mLAD in 5/16.  He has residual significant disease (see description of cath in [facility]) but denies chest pain and is doing well.   - Continue ASA 81, Coreg, Brilinta 90 bid, and atorvastatin 80 mg daily.  - Will add lisinopril 5 mg daily with BMET today and in 2 wks.   2. Hyperlipidemia: Good lipids in 8/16.   3. Chronic systolic CHF: Ischemic CMP, EF 50-55% by echo 8/16 (improved from EF 30-35% in 5/16).  Volume status stable.   NYHA class II symptoms though not very active.  - Continue Bidil 1 tab tid.  - Add lisinopril 5 mg daily.  Check BMET today and in 2 weeks. - Continue Coreg - D/c Lifevest with improved EF. OK to increase activity as able. 4. Left carotid bruit: Minimal carotid disease on carotid dopplers.  5. Atrial fibrillation: Paroxysmal, noted after MI.  Of note, patient had a CVA likely related to atrial fibrillation.  No residual deficits.  - Poor anticoagulation candidate due to need for ASA/Brilinta and hx of ETOH abuse.   - He has quit drinking since and may be a candidate for anticoagulation in the future.  Would not have him on both an [facility] and Brilinta.  6. ETOH abuse: Congratulated on continued abstinence.   Shirley Friar PA-C 06/06/2015   Patient seen with PA, agree with the above note.  BP high today but has not taken his meds yet.  I would like to start him on an ACEI (lisinopril 5 mg daily).  BMET today and in 2 wks.  With improvement in EF, can take off Lifevest.  Finally, needs to stay off ETOH and cigarettes.   Loralie Champagne 06/07/2015

## 2015-06-19 ENCOUNTER — Telehealth: Payer: Self-pay | Admitting: *Deleted

## 2015-06-19 ENCOUNTER — Ambulatory Visit (HOSPITAL_COMMUNITY)
Admission: RE | Admit: 2015-06-19 | Discharge: 2015-06-19 | Disposition: A | Discharge status: Home / Self Care | Payer: Medicaid Other | Source: Ambulatory Visit | Attending: Cardiology | Admitting: Cardiology

## 2015-06-19 DIAGNOSIS — I5022 Chronic systolic (congestive) heart failure: Secondary | ICD-10-CM

## 2015-06-19 DIAGNOSIS — I5021 Acute systolic (congestive) heart failure: Secondary | ICD-10-CM

## 2015-06-19 LAB — BASIC METABOLIC PANEL
Anion gap: 5 (ref 5–15)
BUN: 19 mg/dL (ref 6–20)
CHLORIDE: 107 mmol/L (ref 101–111)
CO2: 30 mmol/L (ref 22–32)
Calcium: 9.8 mg/dL (ref 8.9–10.3)
Creatinine, Ser: 1.27 mg/dL — ABNORMAL HIGH (ref 0.61–1.24)
GFR calc Af Amer: >60 mL/min (ref >60)
GFR calc non Af Amer: >60 mL/min (ref >60)
Glucose, Bld: 154 mg/dL — ABNORMAL HIGH (ref 65–99)
Potassium: 4.6 mmol/L (ref 3.5–5.1)
SODIUM: 142 mmol/L (ref 135–145)

## 2015-06-19 NOTE — Telephone Encounter (Signed)
Mr Josephson called to ask about the neurology consult Dr Letta Pate was going to order.  He has not heard anything and has asked for a call back.

## 2015-06-20 ENCOUNTER — Telehealth: Payer: Self-pay | Admitting: Nurse Practitioner

## 2015-06-20 NOTE — Telephone Encounter (Signed)
Walk In pt form-Envelope dropped off-paperwork needs completed by Cecille Rubin per patient she will be back in Office Friday 9/30 Will give to her then

## 2015-06-28 ENCOUNTER — Encounter: Payer: Self-pay | Admitting: Cardiology

## 2015-07-09 ENCOUNTER — Other Ambulatory Visit: Payer: Self-pay

## 2015-08-06 ENCOUNTER — Telehealth (HOSPITAL_COMMUNITY): Payer: Self-pay

## 2015-08-06 NOTE — Telephone Encounter (Signed)
Form faxed to [facility] to [place].  Date changed on form to when Dr. Aundra Dubin first encounter with patient per record

## 2015-08-27 ENCOUNTER — Encounter: Payer: Self-pay | Admitting: Neurology

## 2015-08-27 ENCOUNTER — Ambulatory Visit (INDEPENDENT_AMBULATORY_CARE_PROVIDER_SITE_OTHER): Payer: Medicaid Other | Admitting: Neurology

## 2015-08-27 VITALS — BP 189/96 | HR 102 | Ht 66.0 in | Wt 163.2 lb

## 2015-08-27 DIAGNOSIS — R569 Unspecified convulsions: Secondary | ICD-10-CM

## 2015-08-27 DIAGNOSIS — F1010 Alcohol abuse, uncomplicated: Secondary | ICD-10-CM

## 2015-08-27 DIAGNOSIS — I6349 Cerebral infarction due to embolism of other cerebral artery: Secondary | ICD-10-CM | POA: Diagnosis not present

## 2015-08-27 DIAGNOSIS — F1011 Alcohol abuse, in remission: Secondary | ICD-10-CM

## 2015-08-27 NOTE — Patient Instructions (Addendum)
Remember to drink plenty of fluid, eat healthy meals and do not skip any meals. Try to eat protein with a every meal and eat a healthy snack such as fruit or nuts in between meals. Try to keep a regular sleep-wake schedule and try to exercise daily, particularly in the form of walking, 20-30 minutes a day, if you can.   As far as your medications are concerned, I would like to suggest: continue Keppra for now  As far as diagnostic testing: EEG  I would like to see you back for EEG, sooner if we need to. Please call us with any interim questions, concerns, problems, updates or refill requests.   Our phone number is [phone]. We also have an after hours call service for urgent matters and there is a physician on-call for urgent questions. For any emergencies you know to call 911 or go to the nearest emergency room

## 2015-08-27 NOTE — Progress Notes (Signed)
[facility]    Provider:  Dr Jaynee Eagles Referring Provider: Lance Bosch, NP Primary Care Physician:  Lance Bosch, NP  CC:  seizure  HPI:  Timothy [facility] is a 59 y.o. male here as a referral from Dr. Feliciana Rossetti for stroke. PMHx ETOH abuse, DM, HTN, CAD s/p anterior STEMI 5/16, seizure 5/16, paroxysmal atrial fibrillation, and CVA, CHF.  He is currently on ASA 81 and Brilinta. He was thought to be a poor candidate for anticoagulation given history of ETOH abuse. Patient says he is not here for stroke but he is here to discuss his seizure. He has a PMHx of hypertension, alcohol abuse, diabetes mellitus and coronary artery disease, maintained on aspirin therapy, bilateral cerebral infarcts cardioembolic. He's had balance issues as well as cognitive issues including decreased orientation decrease in memory decreased attention.  Also has had seizures 01/23/2015. He doesn't remember the seizure at all. At the time he was drinking quite a bit, he says one "very big" beer every day. It is called "a 7" and was not drinking any liquor. The last time he had alcohol was May. His wife won't let him drink anymore. Wife says he was drinking quite a lot. He denies any etoh use since then. Wife says he didn't have a job, he quit taking his medication for 4 years when he lost his job. He lost his job in 2008. During that time he was drinking heavily, not taking medication. They went out to eat and came home, wife heard a noise hitting the wall. He had fallen out of the bed, she ran to get some help to call 911. Wife says he did not know what was going on at all. The paramedics came and he had another seizure when they came. He had a massive heart attack in the hospital. He has not had any episodes of seizures since then. No history if seizures or seizures in the family. Denies memory loss. Wife says sometimes he doesn't remember things, he forgets. Patient denies.   He is agitated today. Gets  very upset that I am asking questions. Just wants to discuss seizure, doesn't understand why I need to ask so many questions. Doesn't feel he should have to answer anything and yells at wife when she answers.  Reviewed notes, labs and imaging from outside physicians, which showed:  He presented to [facility] on Jan 23, 2015, with seizures. Extremely agitated required intubation. Alcohol level and drug screen negative. CT of the head negative. EKG with precordial ST changes concerning for ischemia and infarct. Transferred to [facility] for ongoing care. Repeat EKG showed worsened ST elevation, T-wave inversions, significantly elevated troponin. Seizure was thought to be ETOH withdrawal. Cath  showed diffuse disease with culprit being 100% mLAD stenosis. He had DES to mLAD. Echo showed EF 30-35%. Post-PCI, he had atrial fibrillation and had a CVA.  Personally reviewed MRi of the brain images: : Multiple small foci of acute ischemia spanning multiple vascular territories (involving the cerebrum and cerebellum) most consistent with embolic event.  Review of Systems: Patient complains of symptoms per HPI as well as the following symptoms: blurred vision, sob, cough, impotence, memory loss, slurred speech, seizure, restless legs, seizure, not enough sleep, decreased energy, change in appetite. Pertinent negatives per HPI. All others negative.   Social History   Social History  . Marital Status: Married    Spouse Name: N/A  . Number of Children: N/A  . Years of Education:  N/A   Occupational History  . Not on file.   Social History Main Topics  . Smoking status: Former Smoker -- 0.33 packs/day for 14 years    Types: Cigarettes  . Smokeless tobacco: Never Used     Comment: "quit smoking cigarettes in 1992"  . Alcohol Use: No     Comment: 02/19/2015 "stopped drinking 01/22/2015"  . Drug Use: No  . Sexual Activity: Not Currently   Other Topics Concern  . Not on file     Social History Narrative   Lives   Caffeine use: Coffee sometimes    Family History  Problem Relation Age of Onset  . Diabetes Father   . Seizures Neg Hx     Past Medical History  Diagnosis Date  . Coronary artery disease   . Hypertension   . HLD (hyperlipidemia)   . GERD (gastroesophageal reflux disease)   . Refusal of blood transfusions as patient is Jehovah's Witness   . CHF (congestive heart failure) ([place])   . Myocardial infarction ([place]) 01/22/2015    "massive"  . Type II diabetes mellitus ([place])   . Seizures ([place]) 01/22/2015    "in front of paramedics"  . Stroke [facility]) 01/22/2015    "short term memory loss & right side weak since" (02/19/2015)  . Acute renal failure (ARF) ([place]) 02/19/2015    Past Surgical History  Procedure Laterality Date  . Cardiac catheterization N/A 01/23/2015    Procedure: Left Heart Cath and Coronary Angiography;  Surgeon: [name], MD;  Location: [facility] INVASIVE CV LAB CUPID;  Service: Cardiovascular;  Laterality: N/A;  . Cardiac catheterization N/A 01/23/2015    Procedure: Coronary Stent Intervention;  Surgeon: [name], MD;  Location: [facility] INVASIVE CV LAB CUPID;  Service: Cardiovascular;  Laterality: N/A;    Current Outpatient Prescriptions  Medication Sig Dispense Refill  . aspirin 81 MG chewable tablet Chew 1 tablet (81 mg total) by mouth daily.    Marland Kitchen atorvastatin (LIPITOR) 80 MG tablet Take 1 tablet (80 mg total) by mouth daily at 6 PM. 30 tablet 3  . carvedilol (COREG) 25 MG tablet Take 1 tablet (25 mg total) by mouth 2 (two) times daily with a meal. 60 tablet 1  . folic acid (FOLVITE) 1 MG tablet Take 1 tablet (1 mg total) by mouth daily. 30 tablet 1  . glucose blood (TRUE METRIX BLOOD GLUCOSE TEST) test strip Use as instructed 100 each 12  . Insulin Glargine (LANTUS SOLOSTAR) 100 UNIT/ML Solostar Pen Inject 24 Units into the skin daily at 10 pm. 30 mL 3  . Insulin Syringe-Needle U-100 (INSULIN SYRINGE .5CC/30GX1/2") 30G X 1/2" 0.5 ML MISC  Check blood sugar TID & QHS 100 each 2  . isosorbide-hydrALAZINE (BIDIL) 20-37.5 MG per tablet Take 1 tablet by mouth 3 (three) times daily. 90 tablet 12  . lactulose (CHRONULAC) 10 GM/15ML solution Take 15 mLs (10 g total) by mouth 3 (three) times daily. 946 mL 1  . LANTUS 100 UNIT/ML injection   11  . levETIRAcetam (KEPPRA) 100 MG/ML solution Take 5 mLs (500 mg total) by mouth every 12 (twelve) hours. 473 mL 12  . lisinopril (PRINIVIL,ZESTRIL) 5 MG tablet Take 1 tablet (5 mg total) by mouth daily. 30 tablet 3  . ticagrelor (BRILINTA) 90 MG TABS tablet Take 1 tablet (90 mg total) by mouth 2 (two) times daily. 180 tablet 3  . TRUEPLUS LANCETS 28G MISC 1 each by Does not apply route 3 (three) times daily before meals. 100 each  12   No current facility-administered medications for this visit.    Allergies as of 08/27/2015  . (No Known Allergies)    Vitals: BP 189/96 mmHg  Pulse 102  Ht 5\' 6"  (1.676 m)  Wt 163 lb 3.2 oz (74.027 kg)  BMI 26.35 kg/m2 Last Weight:  Wt Readings from Last 1 Encounters:  08/27/15 163 lb 3.2 oz (74.027 kg)   Last Height:   Ht Readings from Last 1 Encounters:  08/27/15 5\' 6"  (1.676 m)    Physical exam: Exam: Gen: NAD, conversant, well nourised, obese, well groomed                     CV: RRR, no MRG. No Carotid Bruits. No peripheral edema, warm, nontender Eyes: Conjunctivae clear without exudates or hemorrhage  Neuro: Detailed Neurologic Exam  Speech:    Speech is normal; fluent and spontaneous with normal comprehension.  Cognition:    The patient is oriented to person, place, and time;     MMSE - Mini Mental State Exam 08/27/2015  Orientation to time 5  Orientation to Place 5  Registration 3  Attention/ Calculation 5  Recall 3  Language- name 2 objects 2  Language- repeat 1  Language- follow 3 step command 3  Language- read & follow direction 1  Write a sentence 1  Copy design 0  Total score 29   Cranial Nerves:    The pupils are  equal, round, and reactive to light. Attempted findoscopic exam but could not visualize due to small pupils.  Visual fields are full to finger confrontation. Extraocular movements are intact. Trigeminal sensation is intact and the muscles of mastication are normal. The face is symmetric. The palate elevates in the midline. Hearing intact. Voice is normal. Shoulder shrug is normal. The tongue has normal motion without fasciculations.   Coordination:    Normal finger to nose and heel to shin.   Gait:    Heel-toe and tandem gait are normal.   Motor Observation:    No asymmetry, no atrophy. Mild postural tremor. Tone:    Normal muscle tone.    Posture:    Posture is normal. normal erect    Strength:    Strength is V/V in the upper and lower limbs.      Sensation: intact to LT     Reflex Exam:  DTR's: Absent AJs otherwise deep tendon reflexes in the upper and lower extremities are brisk bilaterally.   Toes:    The toes are equivocal bilaterally.   Clonus:    Clonus is absent.     Assessment/Plan:  Patient who presented to [facility] on Jan 23, 2015, with seizures. Extremely agitated required intubation. Alcohol level and drug screen negative. Transferred to [facility] for ongoing care. Repeat EKG showed worsened ST elevation, T-wave inversions, significantly elevated troponin. Seizure was thought to be ETOH withdrawal. Cath  showed diffuse disease with culprit being 100% mLAD stenosis. He had DES to mLAD. Echo showed EF 30-35%. Post-PCI, he had atrial fibrillation and had a CVA. MRi of the brain showed multiple small foci of acute ischemia spanning multiple vascular territories (involving the cerebrum and cerebellum) most consistent with embolic event.  Seizure: Likely provoked due to emboli vs ETOH withdrawal. He has been seizure free for over 6 months and legally he can drive. He reports no alcohol use since May. MMSE was 29/30. Will order an EEG, if negative can  stop Keppra.  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 Follow with cardiology. For further episodes of afib may consider changing to anticoagulation. Continue to follow with cardiology.  Needs close follow up with primary care for management of vascular risk factors. Encouraged to be compliant with medications.  Sarina Ill, MD  [facility] [address] [place], [place] [address]  Phone [phone] Fax [phone]

## 2015-08-28 ENCOUNTER — Encounter: Payer: Self-pay | Admitting: Neurology

## 2015-09-06 ENCOUNTER — Ambulatory Visit: Payer: Medicaid Other | Attending: Internal Medicine | Admitting: Internal Medicine

## 2015-09-06 ENCOUNTER — Encounter: Payer: Self-pay | Admitting: Internal Medicine

## 2015-09-06 VITALS — BP 140/80 | HR 101 | Temp 98.2°F | Resp 20 | Ht 66.0 in | Wt 164.0 lb

## 2015-09-06 DIAGNOSIS — I509 Heart failure, unspecified: Secondary | ICD-10-CM | POA: Diagnosis not present

## 2015-09-06 DIAGNOSIS — N189 Chronic kidney disease, unspecified: Secondary | ICD-10-CM | POA: Diagnosis not present

## 2015-09-06 DIAGNOSIS — I10 Essential (primary) hypertension: Secondary | ICD-10-CM

## 2015-09-06 DIAGNOSIS — E785 Hyperlipidemia, unspecified: Secondary | ICD-10-CM | POA: Diagnosis not present

## 2015-09-06 DIAGNOSIS — IMO0001 Reserved for inherently not codable concepts without codable children: Secondary | ICD-10-CM

## 2015-09-06 DIAGNOSIS — I129 Hypertensive chronic kidney disease with stage 1 through stage 4 chronic kidney disease, or unspecified chronic kidney disease: Secondary | ICD-10-CM | POA: Insufficient documentation

## 2015-09-06 DIAGNOSIS — K219 Gastro-esophageal reflux disease without esophagitis: Secondary | ICD-10-CM | POA: Diagnosis not present

## 2015-09-06 DIAGNOSIS — E1165 Type 2 diabetes mellitus with hyperglycemia: Secondary | ICD-10-CM | POA: Diagnosis not present

## 2015-09-06 DIAGNOSIS — G40909 Epilepsy, unspecified, not intractable, without status epilepticus: Secondary | ICD-10-CM | POA: Insufficient documentation

## 2015-09-06 DIAGNOSIS — Z79899 Other long term (current) drug therapy: Secondary | ICD-10-CM | POA: Insufficient documentation

## 2015-09-06 DIAGNOSIS — Z7982 Long term (current) use of aspirin: Secondary | ICD-10-CM | POA: Diagnosis not present

## 2015-09-06 DIAGNOSIS — I252 Old myocardial infarction: Secondary | ICD-10-CM | POA: Insufficient documentation

## 2015-09-06 DIAGNOSIS — Z794 Long term (current) use of insulin: Secondary | ICD-10-CM | POA: Diagnosis not present

## 2015-09-06 DIAGNOSIS — Z8673 Personal history of transient ischemic attack (TIA), and cerebral infarction without residual deficits: Secondary | ICD-10-CM | POA: Insufficient documentation

## 2015-09-06 LAB — POCT GLYCOSYLATED HEMOGLOBIN (HGB A1C): HEMOGLOBIN A1C: 7.6

## 2015-09-06 LAB — GLUCOSE, POCT (MANUAL RESULT ENTRY): POC GLUCOSE: 132 mg/dL — AB (ref 70–99)

## 2015-09-06 LAB — BASIC METABOLIC PANEL
BUN: 13 mg/dL (ref 7–25)
CHLORIDE: 103 mmol/L (ref 98–110)
CO2: 28 mmol/L (ref 20–31)
CREATININE: 1.19 mg/dL (ref 0.70–1.33)
Calcium: 9.3 mg/dL (ref 8.6–10.3)
Glucose, Bld: 149 mg/dL — ABNORMAL HIGH (ref 65–99)
POTASSIUM: 4.1 mmol/L (ref 3.5–5.3)
Sodium: 140 mmol/L (ref 135–146)

## 2015-09-06 MED ORDER — FOLIC ACID 1 MG PO TABS: 1.0000 mg | ORAL_TABLET | Freq: Every day | ORAL | Status: DC

## 2015-09-06 MED ORDER — CLONIDINE HCL 0.2 MG PO TABS
0.2000 mg | ORAL_TABLET | Freq: Once | ORAL | Status: AC
  Administered 2015-09-06: 0.2 mg via ORAL

## 2015-09-06 MED ORDER — CARVEDILOL 25 MG PO TABS: 25.0000 mg | ORAL_TABLET | Freq: Two times a day (BID) | ORAL | Status: DC

## 2015-09-06 MED ORDER — LISINOPRIL 5 MG PO TABS: 5.0000 mg | ORAL_TABLET | Freq: Every day | ORAL | Status: DC

## 2015-09-06 NOTE — Progress Notes (Signed)
Patient ID: Timothy Lambert, male   DOB: 12/12/1955, 59 y.o.   MRN: [id number] SUBJECTIVE: 59 y.o. male for follow up of diabetes and hypertension. Patient has a past medical history of HLD, GERD, CVA, STEMI, seizures, CHF, CKD, and alcohol abuse. Patient takes his insulin daily without skipped doses and reports that he has been attempting to follow ADA diet recommendations. He checks his sugars once per day which are usually below 140. He is being follwed by [facility] for his seizure disorder which he last saw 2 weeks ago. Review of EMR revealed that patient was agitated and will be scheduled for a EEG soon. He denies any seizure activity. He currently on takes Aspirin and Brilinta because he is not a candidate for anticoagulation due to alcohol abuse. He reports that he has not drink alcohol since his heart attack.  Current Outpatient Prescriptions  Medication Sig Dispense Refill  . atorvastatin (LIPITOR) 80 MG tablet Take 1 tablet (80 mg total) by mouth daily at 6 PM. 30 tablet 3  . glucose blood (TRUE METRIX BLOOD GLUCOSE TEST) test strip Use as instructed 100 each 12  . Insulin Glargine (LANTUS SOLOSTAR) 100 UNIT/ML Solostar Pen Inject 24 Units into the skin daily at 10 pm. 30 mL 3  . Insulin Syringe-Needle U-100 (INSULIN SYRINGE .5CC/30GX1/2") 30G X 1/2" 0.5 ML MISC Check blood sugar TID & QHS 100 each 2  . isosorbide-hydrALAZINE (BIDIL) 20-37.5 MG per tablet Take 1 tablet by mouth 3 (three) times daily. 90 tablet 12  . lactulose (CHRONULAC) 10 GM/15ML solution Take 15 mLs (10 g total) by mouth 3 (three) times daily. 946 mL 1  . LANTUS 100 UNIT/ML injection   11  . levETIRAcetam (KEPPRA) 100 MG/ML solution Take 5 mLs (500 mg total) by mouth every 12 (twelve) hours. 473 mL 12  . lisinopril (PRINIVIL,ZESTRIL) 5 MG tablet Take 1 tablet (5 mg total) by mouth daily. 30 tablet 3  . ticagrelor (BRILINTA) 90 MG TABS tablet Take 1 tablet (90 mg total) by mouth 2 (two) times daily. 180 tablet 3  .  TRUEPLUS LANCETS 28G MISC 1 each by Does not apply route 3 (three) times daily before meals. 100 each 12  . aspirin 81 MG chewable tablet Chew 1 tablet (81 mg total) by mouth daily. (Patient not taking: Reported on 09/06/2015)    . carvedilol (COREG) 25 MG tablet Take 1 tablet (25 mg total) by mouth 2 (two) times daily with a meal. (Patient not taking: Reported on 09/06/2015) 60 tablet 1  . folic acid (FOLVITE) 1 MG tablet Take 1 tablet (1 mg total) by mouth daily. (Patient not taking: Reported on 09/06/2015) 30 tablet 1   No current facility-administered medications for this visit.    OBJECTIVE: Appearance: alert, well appearing, and in no distress, oriented to person, place, and time and normal appearing weight. BP 140/80 mmHg  Pulse 101  Temp(Src) 98.2 F (36.8 C) (Oral)  Resp 20  Ht 5\' 6"  (1.676 m)  Wt 164 lb (74.39 kg)  BMI 26.48 kg/m2  SpO2 100%  Exam: heart sounds normal rate, regular rhythm, normal S1, S2, no murmurs, rubs, clicks or gallops, no JVD, chest clear, no hepatosplenomegaly, no carotid bruits  ASSESSMENT: Timothy Lambert was seen today for follow-up.  Diagnoses and all orders for this visit:  Uncontrolled diabetes mellitus type 2 without complications, unspecified long term insulin use status (HCC) -     POCT A1C---7.6% -     Glucose (CBG) A1C has improved. I  would like patient A1C to be 7% as goal. I have discussed diet with patient and advise him to really focus on diet to help get sugar down more. Long term diabetes complications addressed.  Essential hypertension -     cloNIDine (CATAPRES) tablet 0.2 mg; Take 1 tablet (0.2 mg total) by mouth once in opffice -     lisinopril (PRINIVIL,ZESTRIL) 5 MG tablet; Take 1 tablet (5 mg total) by mouth daily. -     carvedilol (COREG) 25 MG tablet; Take 1 tablet (25 mg total) by mouth 2 (two) times daily with a meal. -     Basic Metabolic Panel Patient did not take medication this AM. I have advised that he take his medication  daily and discussed long term complications of uncontrolled HTN such as renal disease and heart disease.    PLAN: See orders for this visit as documented in the electronic medical record. Issues reviewed with him: diabetic diet discussed in detail, written exchange diet given, low cholesterol diet, weight control and daily exercise discussed, foot care discussed and Podiatry visits discussed, annual eye examinations at Ophthalmology discussed, long term diabetic complications discussed and take all medications as directed.  Return in about 3 months (around 12/05/2015) for DM/HTN.    Lance Bosch, NP 09/17/2015 1:10 PM

## 2015-09-06 NOTE — Progress Notes (Signed)
Patient here for 3 month FU for DM/HTN  Patient denies pain at this time.  Patient has been out of Aspirin,Carvedilol, and folic acid. Patient is requesting a refill.  Patient has taken his insulin this morning.

## 2015-09-12 ENCOUNTER — Telehealth: Payer: Self-pay

## 2015-09-12 NOTE — Telephone Encounter (Signed)
-----   Message from Lance Bosch, NP sent at 09/11/2015  5:41 PM EST ----- Labs normal except elevated sugar on that day

## 2015-09-12 NOTE — Telephone Encounter (Signed)
Patient not available Left message on voice mail to return our call 

## 2015-09-13 ENCOUNTER — Telehealth: Payer: Self-pay | Admitting: Internal Medicine

## 2015-09-13 NOTE — Telephone Encounter (Signed)
Patient returned phone call, please f/u °

## 2015-09-27 MED FILL — LISINOPRIL 5 MG TABLET: 5 | 30 days supply | Qty: 30 | Fill #2

## 2015-09-27 MED FILL — BRILINTA 90 MG TABLET: 90 | 30 days supply | Qty: 60 | Fill #1

## 2015-10-03 ENCOUNTER — Ambulatory Visit (INDEPENDENT_AMBULATORY_CARE_PROVIDER_SITE_OTHER): Payer: Medicaid Other | Admitting: Neurology

## 2015-10-03 DIAGNOSIS — R569 Unspecified convulsions: Secondary | ICD-10-CM

## 2015-10-03 NOTE — Procedures (Signed)
    History:  Timothy Lambert is a 60 year old gentleman with a history of seizures, and a history of alcohol overuse. The patient has required intubation with seizures and agitation. The patient being evaluated for these events.  This is a routine EEG. No skull defects are noted. Medications include aspirin, Lipitor, Coreg, folic acid, insulin, Bidil, Keppra, Lisinopril, and Brilinta.   EEG classification: Normal awake  Description of the recording: The background rhythms of this recording consists of a fairly well modulated medium amplitude alpha rhythm of 9 Hz that is reactive to eye opening and closure. As the record progresses, the patient appears to remain in the waking state throughout the recording. Photic stimulation was performed, resulting in a bilateral and symmetric photic driving response. Hyperventilation was also performed, resulting in a minimal buildup of the background rhythm activities without significant slowing seen. At no time during the recording does there appear to be evidence of spike or spike wave discharges or evidence of focal slowing. EKG monitor shows no evidence of cardiac rhythm abnormalities with a heart rate of 78.  Impression: This is a normal EEG recording in the waking state. No evidence of ictal or interictal discharges are seen.

## 2015-10-04 ENCOUNTER — Telehealth: Payer: Self-pay | Admitting: *Deleted

## 2015-10-04 NOTE — Telephone Encounter (Signed)
If he has not had a seizure since I saw him and he continues to be alcohol free then there are no restrictions from this stand point for him driving. thanks

## 2015-10-04 NOTE — Telephone Encounter (Signed)
-----   Message from Melvenia Beam, MD sent at 10/03/2015  4:23 PM EST ----- EEG is normal thanks

## 2015-10-04 NOTE — Telephone Encounter (Signed)
I called and spoke to pt and advised him that his EEG is normal. He asked me if he could drive now? I said that I must check with Dr. Jaynee Eagles first, and to please not drive until cleared by her. Pt verbalized understanding. I advised her our office would be giving him a call back regarding his driving restrictions.

## 2015-10-07 NOTE — Telephone Encounter (Signed)
He can stop the Keppra, please let patient know thanks

## 2015-10-07 NOTE — Telephone Encounter (Signed)
Rn call patient back wanting to drive. Rn call patient back to give him the Dr. Jaynee Eagles recommendations. RN stated per Dr.Ahern if he has not had a seizure since the last office visit, and continues to be alcohol free, there are not restrictions. He can drive without any restrictions. Pt verbalized understanding, and will follow Dr.Ahern advice.

## 2015-10-08 NOTE — Telephone Encounter (Signed)
Called and relayed message below per Dr Jaynee Eagles. Pt verbalized understanding.

## 2015-10-10 MED FILL — CARVEDILOL 25 MG TABLET: 25 | 30 days supply | Qty: 60 | Fill #1

## 2015-10-10 MED FILL — FOLIC ACID 1 MG TABLET: 1 | 30 days supply | Qty: 30 | Fill #1

## 2015-10-10 MED FILL — ATORVASTATIN 80 MG TABLET: 80 | 30 days supply | Qty: 30 | Fill #2

## 2015-10-10 MED FILL — LANTUS SOLOSTAR 100 UNITS/M: 100 | 30 days supply | Qty: 9 | Fill #1

## 2015-10-21 ENCOUNTER — Telehealth: Payer: Self-pay | Admitting: Pharmacist

## 2015-10-21 NOTE — Telephone Encounter (Signed)
Called patient to see if he had received the flu shot or if he was interested in receiving it. Patient has not received the flu shot and is not interested in receiving it this year. Will document in chart.

## 2015-10-28 ENCOUNTER — Encounter (HOSPITAL_COMMUNITY): Payer: Self-pay

## 2015-10-28 ENCOUNTER — Ambulatory Visit (HOSPITAL_COMMUNITY)
Admission: RE | Admit: 2015-10-28 | Discharge: 2015-10-28 | Disposition: A | Discharge status: Home / Self Care | Payer: Medicaid Other | Source: Ambulatory Visit | Attending: Cardiology | Admitting: Cardiology

## 2015-10-28 VITALS — BP 168/92 | HR 84 | Wt 161.8 lb

## 2015-10-28 DIAGNOSIS — I480 Paroxysmal atrial fibrillation: Secondary | ICD-10-CM | POA: Diagnosis not present

## 2015-10-28 DIAGNOSIS — I5022 Chronic systolic (congestive) heart failure: Secondary | ICD-10-CM

## 2015-10-28 DIAGNOSIS — Z794 Long term (current) use of insulin: Secondary | ICD-10-CM | POA: Diagnosis not present

## 2015-10-28 DIAGNOSIS — I252 Old myocardial infarction: Secondary | ICD-10-CM | POA: Diagnosis not present

## 2015-10-28 DIAGNOSIS — I2510 Atherosclerotic heart disease of native coronary artery without angina pectoris: Secondary | ICD-10-CM

## 2015-10-28 DIAGNOSIS — Z7902 Long term (current) use of antithrombotics/antiplatelets: Secondary | ICD-10-CM | POA: Insufficient documentation

## 2015-10-28 DIAGNOSIS — E785 Hyperlipidemia, unspecified: Secondary | ICD-10-CM | POA: Diagnosis not present

## 2015-10-28 DIAGNOSIS — R0989 Other specified symptoms and signs involving the circulatory and respiratory systems: Secondary | ICD-10-CM | POA: Insufficient documentation

## 2015-10-28 DIAGNOSIS — Z8249 Family history of ischemic heart disease and other diseases of the circulatory system: Secondary | ICD-10-CM | POA: Insufficient documentation

## 2015-10-28 DIAGNOSIS — I255 Ischemic cardiomyopathy: Secondary | ICD-10-CM | POA: Insufficient documentation

## 2015-10-28 DIAGNOSIS — F1010 Alcohol abuse, uncomplicated: Secondary | ICD-10-CM | POA: Diagnosis not present

## 2015-10-28 DIAGNOSIS — Z7982 Long term (current) use of aspirin: Secondary | ICD-10-CM | POA: Insufficient documentation

## 2015-10-28 DIAGNOSIS — Z87891 Personal history of nicotine dependence: Secondary | ICD-10-CM | POA: Insufficient documentation

## 2015-10-28 DIAGNOSIS — Z8673 Personal history of transient ischemic attack (TIA), and cerebral infarction without residual deficits: Secondary | ICD-10-CM | POA: Diagnosis not present

## 2015-10-28 DIAGNOSIS — Z79899 Other long term (current) drug therapy: Secondary | ICD-10-CM | POA: Insufficient documentation

## 2015-10-28 DIAGNOSIS — I110 Hypertensive heart disease with heart failure: Secondary | ICD-10-CM | POA: Insufficient documentation

## 2015-10-28 DIAGNOSIS — K219 Gastro-esophageal reflux disease without esophagitis: Secondary | ICD-10-CM | POA: Diagnosis not present

## 2015-10-28 MED ORDER — ISOSORB DINITRATE-HYDRALAZINE 20-37.5 MG PO TABS: 2.0000 | ORAL_TABLET | Freq: Three times a day (TID) | ORAL | Status: DC

## 2015-10-28 MED FILL — BIDIL TABLET: 20-37.5 | 30 days supply | Qty: 180 | Fill #0

## 2015-10-28 NOTE — Progress Notes (Signed)
Patient ID: Timothy Lambert, male   DOB: 1956-08-03, 60 y.o.   MRN: [id number]    Advanced Heart Failure Clinic Note   PCP: Dr. Feliciana Rossetti HF: Dr. Aundra Dubin   60 yo with history of prior ETOH abuse, DM, HTN, CAD s/p anterior STEMI 5/16, paroxysmal atrial fibrillation, and CVA presents for CHF clinic followup.  Patient was admitted to [facility] in 5/16 with an ETOH withdrawal seizure.  He was also noted to have anterior ST elevation and was sent for cath.  This showed diffuse disease with culprit being 100% mLAD stenosis.  He had DES to mLAD.  Echo showed EF 30-35%.  Post-PCI, he had atrial fibrillation and had a CVA.  He has no residual deficits from the CVA.  He is currently on ASA 81 and Brilinta.  He was thought to be a poor candidate for anticoagulation given history of ETOH abuse.  He was readmitted with AKI and dehydration.  ACEI was stopped.    He returns today for regular follow up. At last visit added lisinopril 5 mg daily. Up 3 lbs from last visit. SBP remains elevated into 160s. States he had been taking Bidil BID until about a month ago,now taking tid. No SOB walking in grocery store, doesn't need to stop and take breaks, just takes his time. Continued abstinence from alcohol and smoking. No CP. No orthopnea/PND.  States BPs at home from 130 - 150s. Not back at work, working on disability. Would like to try to go back to work at least part time. Echo in 8/16 showed improvement in EF to 50-55%. Drinking less than 2L, doesn't add salt.   Labs (7/16): K 4, creatinine 1.06, HCT 31.6 Labs (8/16) K 4.4, creatinine 1.43, LDL 41, HDL 29 Labs (12/16) K 4.1, creatinine 1.19  ECG: NSR, nonspecific T wave changes, poor RWP.   PMH: 1. Type II diabetes 2. HTN 3. H/o heavy ETOH abuse: Has now quit. H/o withdrawal seizure. 4. GERD 5. CVA 02/19/15: Related to atrial fibrillation.  6. AKI with ACEI 7. CAD: Presented to [facility] with anterior STEMI 5/16.  LHC with 50% LM, 99% mLAD, 90% D1, 75% ramus, 70% mRCA.   Patient had DES to mLAD.   8. Ischemic cardiomyopathy: Echo (5/16) with EF 30-35% with wall motion abnormalities, normal RV size and systolic function. Echo (8/16) with EF 50-55%, mild mid-apical anteroseptal HK.  9. Atrial fibrillation: Paroxysmal.  Noted after STEMI in 5/16.  Initially not anticoagulated due to need for ticagrelor and concern over ETOH abuse.   10. Carotid dopplers (8/16) with minimal carotid disease.   SH: Married, prior heavy ETOH (has quit).  Prior smoker (has quit).  Lives in [place].    FH: CAD  ROS: All systems reviewed and negative except as per HPI.   Current Outpatient Prescriptions  Medication Sig Dispense Refill  . aspirin 81 MG chewable tablet Chew 1 tablet (81 mg total) by mouth daily. (Patient not taking: Reported on 09/06/2015)    . atorvastatin (LIPITOR) 80 MG tablet Take 1 tablet (80 mg total) by mouth daily at 6 PM. 30 tablet 3  . carvedilol (COREG) 25 MG tablet Take 1 tablet (25 mg total) by mouth 2 (two) times daily with a meal. 60 tablet 1  . folic acid (FOLVITE) 1 MG tablet Take 1 tablet (1 mg total) by mouth daily. 30 tablet 1  . glucose blood (TRUE METRIX BLOOD GLUCOSE TEST) test strip Use as instructed 100 each 12  . Insulin Glargine (LANTUS  SOLOSTAR) 100 UNIT/ML Solostar Pen Inject 24 Units into the skin daily at 10 pm. 30 mL 3  . Insulin Syringe-Needle U-100 (INSULIN SYRINGE .5CC/30GX1/2") 30G X 1/2" 0.5 ML MISC Check blood sugar TID & QHS 100 each 2  . isosorbide-hydrALAZINE (BIDIL) 20-37.5 MG per tablet Take 1 tablet by mouth 3 (three) times daily. 90 tablet 12  . LANTUS 100 UNIT/ML injection   11  . levETIRAcetam (KEPPRA) 100 MG/ML solution Take 5 mLs (500 mg total) by mouth every 12 (twelve) hours. 473 mL 12  . lisinopril (PRINIVIL,ZESTRIL) 5 MG tablet Take 1 tablet (5 mg total) by mouth daily. 30 tablet 3  . ticagrelor (BRILINTA) 90 MG TABS tablet Take 1 tablet (90 mg total) by mouth 2 (two) times daily. 180 tablet 3  . TRUEPLUS LANCETS  28G MISC 1 each by Does not apply route 3 (three) times daily before meals. 100 each 12   No current facility-administered medications for this encounter.   BP 168/92 mmHg  Pulse 84  Wt 161 lb 12 oz (73.369 kg)  SpO2 98%   Wt Readings from Last 3 Encounters:  10/28/15 161 lb 12 oz (73.369 kg)  09/06/15 164 lb (74.39 kg)  08/27/15 163 lb 3.2 oz (74.027 kg)   General: NAD Neck: No JVD, no thyromegaly or thyroid nodule, brisk carotid. Lungs: CTAB, normal effort CV: Nondisplaced PMI.  Heart regular S1/S2, no S3/S4, no murmur.    Left carotid bruit.  Normal pedal pulses.  Abdomen: Soft, NT, ND, no HSM. No bruits or masses. +BS  Skin: Intact without lesions or rashes.  Neurologic: Alert and oriented x 3.  Psych: Normal affect. Extremities: No clubbing or cyanosis. No edema. HEENT: Normal.   Assessment/Plan: 1. CAD: s/p anterior STEMI with DES to mLAD in 5/16.  He has residual significant disease (see description of cath in [facility]) but denies chest pain and is doing well.   - Continue ASA 81, Coreg, Brilinta 90 bid, and atorvastatin 80 mg daily.  - Continue lisinopril 5 mg daily 2. Hyperlipidemia:  Stable lipids in 8/16.   - Continue atorvastatin 80 mg daily.  3. Chronic systolic CHF: Ischemic CMP, EF 50-55% by echo 8/16 (improved from EF 30-35% in 5/16).  He is out of ICD range.  Volume status stable.   NYHA class II symptoms.  - With elevated BP, increase Bidil to 2 tabs tid.  - Continue lisinopril 5 mg daily.   - Continue Coreg 4. Left carotid bruit: Minimal carotid disease on carotid dopplers.  5. Atrial fibrillation: Paroxysmal, noted after MI.  Of note, patient had a CVA likely related to atrial fibrillation.  No residual deficits. CHADSVASC = 5.  Considered poor anticoagulation candidate due to need for ASA/Brilinta and hx of ETOH abuse. However, he has stopped drinking and has successfully remained abstinent.   - In June will be > 1 year s/p MI.  Will stop ASA/Brilinta at that  time and start Xarelto 20 mg daily. 6. ETOH abuse: Congratulated on continued abstinence.  7. Smoking: He is staying off cigarettes.  Increase Bidil as above. Plan on stopping ASA/Brilinta in June and starting Xarelto. EKG today. Follow up 4 months (early June)  Shirley Friar PA-C 10/28/2015   Patient seen with PA, agree with the above note.  Doing well, EF improved to 50-55%, no longer drinking or smoking.  - Increase Bidil to 2 tabs tid to help with BP.  - in 6/16, will stop ASA/ticagrelor and start him on Xarelto  20 mg daily because of atrial fibrillation with stroke history.  He is no longer drinking, so I am comfortable with him going on anticoagulation.  Loralie Champagne 10/28/2015

## 2015-10-28 NOTE — Progress Notes (Signed)
Advanced Heart Failure Medication Review by a Pharmacist  Does the patient  feel that his/her medications are working for him/her?  yes  Has the patient been experiencing any side effects to the medications prescribed?  no  Does the patient measure his/her own blood pressure or blood glucose at home?  yes   Does the patient have any problems obtaining medications due to transportation or finances?   no  Understanding of regimen: good Understanding of indications: good Potential of compliance: good Patient understands to avoid NSAIDs. Patient understands to avoid decongestants.  Issues to address at subsequent visits: Bidil Medicaid approval    Pharmacist comments: Timothy Lambert is a pleasant 61 yo M presenting without a medication list but with good recall of his regimen. He reports better compliance with his current medications and states that he started taking Bidil TID instead of BID about a month ago and has tolerated this well. In September of last year he was approved for patient assistance through [employer] but now has Medicaid. Since Bidil is on their formulary, he should be able to get it through his pharmacy now.   [name], PharmD, BCPS, CPP Clinical Pharmacist Pager: [phone] Phone: [phone] 10/28/2015 11:44 AM      Time with patient: 10 minutes Preparation and documentation time: 4 minutes Total time: 14 minutes

## 2015-10-28 NOTE — Patient Instructions (Addendum)
INCREASE Bidil to 2 tabs three times daily.  May work part time if allowed by disability.  Follow up early June.  Do the following things EVERYDAY: 1) Weigh yourself in the morning before breakfast. Write it down and keep it in a log. 2) Take your medicines as prescribed 3) Eat low salt foods-Limit salt (sodium) to 2000 mg per day.  4) Stay as active as you can everyday 5) Limit all fluids for the day to less than 2 liters

## 2015-11-01 ENCOUNTER — Other Ambulatory Visit: Payer: Self-pay | Admitting: Internal Medicine

## 2015-11-01 ENCOUNTER — Other Ambulatory Visit: Payer: Self-pay

## 2015-11-01 MED ORDER — INSULIN PEN NEEDLE 31G X 5 MM MISC: Status: DC

## 2015-11-01 MED FILL — BRILINTA 90 MG TABLET: 90 | 30 days supply | Qty: 60 | Fill #2

## 2015-11-01 MED FILL — LISINOPRIL 5 MG TABLET: 5 | 30 days supply | Qty: 30 | Fill #3

## 2015-11-01 NOTE — Telephone Encounter (Signed)
If he stopped drinking he should be fine without it

## 2015-11-04 NOTE — Telephone Encounter (Signed)
Please D/C

## 2015-11-13 ENCOUNTER — Other Ambulatory Visit: Payer: Self-pay | Admitting: Internal Medicine

## 2015-11-13 MED FILL — LANTUS SOLOSTAR 100 UNITS/M: 100 | 30 days supply | Qty: 9 | Fill #2

## 2015-11-13 MED FILL — ULTICARE PEN NDL 4MM 32G: 32G X 4 MM | 25 days supply | Qty: 100 | Fill #0

## 2015-11-14 LAB — HM DIABETES EYE EXAM

## 2015-11-15 MED FILL — CARVEDILOL 25 MG TABLET: 25 | 30 days supply | Qty: 60 | Fill #0

## 2015-11-15 MED FILL — FOLIC ACID 1 MG TABLET: 1 | 30 days supply | Qty: 30 | Fill #0

## 2015-11-18 ENCOUNTER — Other Ambulatory Visit: Payer: Self-pay | Admitting: Internal Medicine

## 2015-11-18 DIAGNOSIS — E11319 Type 2 diabetes mellitus with unspecified diabetic retinopathy without macular edema: Secondary | ICD-10-CM | POA: Insufficient documentation

## 2015-11-19 ENCOUNTER — Encounter (HOSPITAL_COMMUNITY): Payer: Self-pay | Admitting: Cardiology

## 2015-11-19 ENCOUNTER — Emergency Department (HOSPITAL_COMMUNITY)
Admission: EM | Admit: 2015-11-19 | Discharge: 2015-11-19 | Disposition: A | Discharge status: Home / Self Care | Payer: Medicaid Other | Attending: Emergency Medicine | Admitting: Emergency Medicine

## 2015-11-19 DIAGNOSIS — I509 Heart failure, unspecified: Secondary | ICD-10-CM | POA: Diagnosis not present

## 2015-11-19 DIAGNOSIS — W268XXA Contact with other sharp object(s), not elsewhere classified, initial encounter: Secondary | ICD-10-CM | POA: Insufficient documentation

## 2015-11-19 DIAGNOSIS — Z792 Long term (current) use of antibiotics: Secondary | ICD-10-CM | POA: Insufficient documentation

## 2015-11-19 DIAGNOSIS — Y998 Other external cause status: Secondary | ICD-10-CM | POA: Insufficient documentation

## 2015-11-19 DIAGNOSIS — Z79899 Other long term (current) drug therapy: Secondary | ICD-10-CM | POA: Diagnosis not present

## 2015-11-19 DIAGNOSIS — Z7982 Long term (current) use of aspirin: Secondary | ICD-10-CM | POA: Diagnosis not present

## 2015-11-19 DIAGNOSIS — Z9889 Other specified postprocedural states: Secondary | ICD-10-CM | POA: Insufficient documentation

## 2015-11-19 DIAGNOSIS — Z7902 Long term (current) use of antithrombotics/antiplatelets: Secondary | ICD-10-CM | POA: Insufficient documentation

## 2015-11-19 DIAGNOSIS — I10 Essential (primary) hypertension: Secondary | ICD-10-CM | POA: Diagnosis not present

## 2015-11-19 DIAGNOSIS — R58 Hemorrhage, not elsewhere classified: Secondary | ICD-10-CM

## 2015-11-19 DIAGNOSIS — E785 Hyperlipidemia, unspecified: Secondary | ICD-10-CM | POA: Insufficient documentation

## 2015-11-19 DIAGNOSIS — Z794 Long term (current) use of insulin: Secondary | ICD-10-CM | POA: Insufficient documentation

## 2015-11-19 DIAGNOSIS — Y9389 Activity, other specified: Secondary | ICD-10-CM | POA: Diagnosis not present

## 2015-11-19 DIAGNOSIS — S91302A Unspecified open wound, left foot, initial encounter: Secondary | ICD-10-CM | POA: Insufficient documentation

## 2015-11-19 DIAGNOSIS — E119 Type 2 diabetes mellitus without complications: Secondary | ICD-10-CM | POA: Diagnosis not present

## 2015-11-19 DIAGNOSIS — Z8673 Personal history of transient ischemic attack (TIA), and cerebral infarction without residual deficits: Secondary | ICD-10-CM | POA: Diagnosis not present

## 2015-11-19 DIAGNOSIS — Z8719 Personal history of other diseases of the digestive system: Secondary | ICD-10-CM | POA: Diagnosis not present

## 2015-11-19 DIAGNOSIS — Y9289 Other specified places as the place of occurrence of the external cause: Secondary | ICD-10-CM | POA: Diagnosis not present

## 2015-11-19 DIAGNOSIS — Z87448 Personal history of other diseases of urinary system: Secondary | ICD-10-CM | POA: Diagnosis not present

## 2015-11-19 DIAGNOSIS — I2510 Atherosclerotic heart disease of native coronary artery without angina pectoris: Secondary | ICD-10-CM | POA: Insufficient documentation

## 2015-11-19 DIAGNOSIS — S9032XA Contusion of left foot, initial encounter: Secondary | ICD-10-CM | POA: Insufficient documentation

## 2015-11-19 DIAGNOSIS — Z87891 Personal history of nicotine dependence: Secondary | ICD-10-CM | POA: Insufficient documentation

## 2015-11-19 DIAGNOSIS — I252 Old myocardial infarction: Secondary | ICD-10-CM | POA: Diagnosis not present

## 2015-11-19 DIAGNOSIS — S91115A Laceration without foreign body of left lesser toe(s) without damage to nail, initial encounter: Secondary | ICD-10-CM | POA: Diagnosis present

## 2015-11-19 MED ORDER — TRANEXAMIC ACID 1000 MG/10ML IV SOLN
500.0000 mg | Freq: Once | INTRAVENOUS | Status: AC
  Administered 2015-11-19: 500 mg via TOPICAL
  Filled 2015-11-19: qty 10

## 2015-11-19 MED ORDER — CEPHALEXIN 500 MG PO CAPS: 500.0000 mg | ORAL_CAPSULE | Freq: Four times a day (QID) | ORAL | Status: DC

## 2015-11-19 NOTE — ED Notes (Signed)
Called pharmacy to follow up on medication request.

## 2015-11-19 NOTE — ED Notes (Signed)
PA completed wound care and holding pressure at this time

## 2015-11-19 NOTE — ED Notes (Addendum)
Called Pharmacy to request Tranexamic Acid for PA.

## 2015-11-19 NOTE — ED Notes (Signed)
Pt reports he was cutting his toenails and cut to deep and made the third toe bleed. States it has been bleeding since this morning. Reports he is on blood thinners and diabetic.

## 2015-11-19 NOTE — ED Notes (Signed)
PA at bedside.

## 2015-11-19 NOTE — ED Notes (Signed)
Pressure dressing removed and bleeding still occuring.  Tranexamic Acid applied by PA and pressure dressing re-applied to toe

## 2015-11-19 NOTE — ED Notes (Signed)
Patient able to ambulate independently  

## 2015-11-19 NOTE — Discharge Instructions (Signed)
If you have continued bleed apply direct pressure to the wound for at least 20 minutes. You may also try a styptic pencil  Deep Skin Avulsion A deep skin avulsion is a type of open wound. It often results from a severe injury (trauma) that tears away all layers of the skin or an entire body part. The areas of the body that are most often affected by a deep skin avulsion include the face, lips, ears, nose, and fingers. A deep skin avulsion may make structures below the skin become visible. You may be able to see muscle, bone, nerves, and blood vessels. A deep skin avulsion can also damage important structures beneath the skin. These include tendons, ligaments, nerves, or blood vessels. CAUSES Injuries that often cause a deep skin avulsion include:  Being crushed.  Falling against a jagged surface.  Animal bites.  Gunshot wounds.  Severe burns.  Injuries that involve being dragged, such as bicycle or motorcycle accidents. SYMPTOMS Symptoms of a deep skin avulsion include:  Pain.  Numbness.  Swelling.  A misshapen body part.  Bleeding, which may be heavy.  Fluid leaking from the wound. DIAGNOSIS This condition may be diagnosed with a medical history and physical exam. You may also have X-rays done. TREATMENT The treatment that is chosen for a deep skin avulsion depends on how large and deep the wound is and where it is located. Treatment for all types of avulsions usually starts with:  Controlling the bleeding.  Washing out the wound with a germ-free (sterile) salt-water solution.  Removing dead tissue from the wound. A wound may be closed or left open to heal. This depends on the size and location of the wound and whether it is likely to become infected. Wounds are usually covered or closed if they expose blood vessels, nerves, bone, or cartilage.  Wounds that are small and clean may be closed with stitches (sutures).  Wounds that cannot be closed with sutures may be  covered with a piece of skin (graft) or a skin flap. Skin may be taken from on or near the wound, from another part of the body, or from a donor.  Wounds may be left open if they are hard to close or they may become infected. These wounds heal over time from the bottom up. You may also receive medicine. This may include:  Antibiotics.  A tetanus shot.  Rabies vaccine. HOME CARE INSTRUCTIONS Medicines  Take or apply over-the-counter and prescription medicines only as told by your health care provider.  If you were prescribed an antibiotic, take or apply it as told by your health care provider. Do not stop taking the antibiotic even if your condition improves.  You may get anti-itch medicine while your wound is healing. Use it only as told by your health care provider. Wound Care  There are many ways to close and cover a wound. For example, a wound can be covered with sutures, skin glue, or adhesive strips. Follow instructions from your health care provider about:  How to take care of your wound.  When and how you should change your bandage (dressing).  When you should remove your dressing.  Removing whatever was used to close your wound.  Keep the dressing dry as told by your health care provider. Do not take baths, swim, use a hot tub, or do anything that would put your wound underwater until your health care provider approves.  Clean the wound each day or as told by your health care  provider.  Wash the wound with mild soap and water.  Rinse the wound with water to remove all soap.  Pat the wound dry with a clean towel. Do not rub it.  Do not scratch or pick at the wound.  Check your wound every day for signs of infection. Watch for:  Redness, swelling, or pain.  Fluid, blood, or pus. General Instructions  Raise (elevate) the injured area above the level of your heart while you are sitting or lying down.  Keep all follow-up visits as told by your health care  provider. This is important. SEEK MEDICAL CARE IF:  You received a tetanus shot and you have swelling, severe pain, redness, or bleeding at the injection site.  You have a fever.  Your pain is not controlled with medicine.  You have increased redness, swelling, or pain at the site of your wound.  You have fluid, blood, or pus coming from your wound.  You notice a bad smell coming from your wound or your dressing.  A wound that was closed breaks open.  You notice something coming out of the wound, such as wood or glass.  You notice a change in the color of your skin near your wound.  You develop a new rash.  You need to change the dressing frequently due to fluid, blood, or pus draining from the wound. SEEK IMMEDIATE MEDICAL CARE IF:  Your pain suddenly increases and is severe.  You develop severe swelling around the wound.  You develop numbness around the wound.  You have nausea and vomiting that does not go away after 24 hours.  You feel light-headed, weak, or faint.  You develop chest pain.  You have trouble breathing.  Your wound is on your hand or foot and you cannot properly move a finger or toe.  The wound is on your hand or foot and you notice that your fingers or toes look pale or bluish.  You have a red streak going away from your wound.   This information is not intended to replace advice given to you by your health care provider. Make sure you discuss any questions you have with your health care provider.   Document Released: 11/03/2006 Document Revised: 01/22/2015 Document Reviewed: 09/12/2014 Elsevier Interactive Patient Education [employer].

## 2015-11-19 NOTE — ED Provider Notes (Signed)
CSN: [id number]     Arrival date & time 11/19/15  1510 History  By signing my name below, I, Essence Howell, attest that this documentation has been prepared under the direction and in the presence of Margarita Mail, PA-C Electronically Signed: Ladene Artist, ED Scribe 11/24/2015 at 8:26 PM.   Chief Complaint  Patient presents with  . Laceration   The history is provided by the patient. No language interpreter was used.   HPI Comments: Timothy Lambert is a 60 y.o. male, with a h/o DM and CAD, who presents to the Emergency Department complaining of a laceration to his left third toe sustained approximately 5 hours ago. Pt states that he was cutting his toenails earlier today when he cut his third toe too deep. He is currently taking Brilinta and states that the wound has been actively bleeding since. He states that he washed the area PTA. Pt denies numbness/tingling or neuropathy. Pt's tetanus is UTD with his last being May 2016.   Past Medical History  Diagnosis Date  . Coronary artery disease   . Hypertension   . HLD (hyperlipidemia)   . GERD (gastroesophageal reflux disease)   . Refusal of blood transfusions as patient is Jehovah's Witness   . CHF (congestive heart failure) ([place])   . Myocardial infarction ([place]) 01/22/2015    "massive"  . Type II diabetes mellitus ([place])   . Seizures ([place]) 01/22/2015    "in front of paramedics"  . Stroke [facility]) 01/22/2015    "short term memory loss & right side weak since" (02/19/2015)  . Acute renal failure (ARF) ([place]) 02/19/2015   Past Surgical History  Procedure Laterality Date  . Cardiac catheterization N/A 01/23/2015    Procedure: Left Heart Cath and Coronary Angiography;  Surgeon: [name], MD;  Location: [facility] INVASIVE CV LAB CUPID;  Service: Cardiovascular;  Laterality: N/A;  . Cardiac catheterization N/A 01/23/2015    Procedure: Coronary Stent Intervention;  Surgeon: [name], MD;  Location: [facility] INVASIVE CV LAB CUPID;  Service: Cardiovascular;   Laterality: N/A;   Family History  Problem Relation Age of Onset  . Diabetes Father   . Seizures Neg Hx    Social History  Substance Use Topics  . Smoking status: Former Smoker -- 0.33 packs/day for 14 years    Types: Cigarettes  . Smokeless tobacco: Never Used     Comment: "quit smoking cigarettes in 1992"  . Alcohol Use: No     Comment: 02/19/2015 "stopped drinking 01/22/2015"    Review of Systems  Skin: Positive for wound.  Neurological: Negative for numbness.  All other systems reviewed and are negative.  Allergies  Review of patient's allergies indicates no known allergies.  Home Medications   Prior to Admission medications   Medication Sig Start Date End Date Taking? Authorizing Provider  aspirin 81 MG chewable tablet Chew 1 tablet (81 mg total) by mouth daily. 02/02/15   Cherene Altes, MD  atorvastatin (LIPITOR) 80 MG tablet Take 1 tablet (80 mg total) by mouth daily at 6 PM. 06/06/15   Larey Dresser, MD  carvedilol (COREG) 25 MG tablet TAKE 1 TABLET BY MOUTH TWICE DAILY WITH A MEAL 11/15/15   Lance Bosch, NP  cephALEXin (KEFLEX) 500 MG capsule Take 1 capsule (500 mg total) by mouth 4 (four) times daily. 11/19/15   Margarita Mail, PA-C  folic acid (FOLVITE) 1 MG tablet TAKE 1 TABLET BY MOUTH DAILY 11/15/15   Lance Bosch, NP  glucose blood (TRUE  METRIX BLOOD GLUCOSE TEST) test strip Use as instructed 03/27/15   Lance Bosch, NP  Insulin Glargine (LANTUS SOLOSTAR) 100 UNIT/ML Solostar Pen Inject 24 Units into the skin daily at 10 pm. 04/05/15   Tresa Garter, MD  Insulin Pen Needle (B-D UF III MINI PEN NEEDLES) 31G X 5 MM MISC Check blood sugar TID & QHS 11/01/15   Lance Bosch, NP  Insulin Syringe-Needle U-100 (INSULIN SYRINGE .5CC/30GX1/2") 30G X 1/2" 0.5 ML MISC Check blood sugar TID & QHS 04/09/15   Deepak Advani, MD  isosorbide-hydrALAZINE (BIDIL) 20-37.5 MG tablet Take 2 tablets by mouth 3 (three) times daily. 10/28/15   Larey Dresser, MD  lisinopril  (PRINIVIL,ZESTRIL) 5 MG tablet Take 1 tablet (5 mg total) by mouth daily. 09/06/15   Lance Bosch, NP  ticagrelor (BRILINTA) 90 MG TABS tablet Take 1 tablet (90 mg total) by mouth 2 (two) times daily. 04/05/15   Tresa Garter, MD  TRUEPLUS LANCETS 28G MISC 1 each by Does not apply route 3 (three) times daily before meals. 04/29/15   Lance Bosch, NP   BP 190/100 mmHg  Pulse 94  Temp(Src) 98 F (36.7 C) (Oral)  Resp 18  Ht 5\' 6"  (1.676 m)  Wt 160 lb (72.576 kg)  BMI 25.84 kg/m2  SpO2 98% Physical Exam  Constitutional: He is oriented to person, place, and time. He appears well-developed and well-nourished. No distress.  HENT:  Head: Normocephalic and atraumatic.  Eyes: Conjunctivae and EOM are normal.  Neck: Neck supple. No tracheal deviation present.  Cardiovascular: Normal rate.   Pulmonary/Chest: Effort normal. No respiratory distress.  Musculoskeletal: Normal range of motion.  Neurological: He is alert and oriented to person, place, and time.  Skin: Skin is warm and dry.  Psychiatric: He has a normal mood and affect. His behavior is normal.  Nursing note and vitals reviewed.  ED Course  Procedures (including critical care time) DIAGNOSTIC STUDIES: Oxygen Saturation is 98% on RA, normal by my interpretation.    COORDINATION OF CARE: 5:01 PM-Discussed treatment plan which includes tranexamic acid with pt at bedside and pt agreed to plan.   Labs Review Labs Reviewed - No data to display  Imaging Review No results found.   EKG Interpretation None      MDM   Final diagnoses:  Bleeding  Avulsion of skin of foot, left, initial encounter  Essential hypertension   Medications  tranexamic acid (CYKLOKAPRON) injection 500 mg (500 mg Topical Given 11/19/15 1735)    Patient with control of bleeding after placement for TXA, Pressure dressing applied and home care instructions given along with return precautions.   I personally performed the services described in  this documentation, which was scribed in my presence. The recorded information has been reviewed and is accurate.     Margarita Mail, PA-C 11/24/15 2028  Lajean Saver, MD 11/29/15 [phone]

## 2015-12-05 MED FILL — LISINOPRIL 5 MG TABLET: 5 | 30 days supply | Qty: 30 | Fill #0

## 2015-12-05 MED FILL — ATORVASTATIN 80 MG TABLET: 80 | 30 days supply | Qty: 30 | Fill #3

## 2015-12-05 MED FILL — BRILINTA 90 MG TABLET: 90 | 30 days supply | Qty: 60 | Fill #3

## 2015-12-19 ENCOUNTER — Encounter: Payer: Self-pay | Admitting: Internal Medicine

## 2015-12-19 ENCOUNTER — Ambulatory Visit: Payer: Medicaid Other | Attending: Internal Medicine | Admitting: Internal Medicine

## 2015-12-19 ENCOUNTER — Encounter (HOSPITAL_COMMUNITY): Payer: Self-pay

## 2015-12-19 VITALS — BP 168/98 | HR 92 | Temp 98.0°F | Resp 16 | Ht 66.0 in | Wt 162.0 lb

## 2015-12-19 DIAGNOSIS — Z79899 Other long term (current) drug therapy: Secondary | ICD-10-CM | POA: Diagnosis not present

## 2015-12-19 DIAGNOSIS — Z7982 Long term (current) use of aspirin: Secondary | ICD-10-CM | POA: Insufficient documentation

## 2015-12-19 DIAGNOSIS — Z5329 Procedure and treatment not carried out because of patient's decision for other reasons: Secondary | ICD-10-CM

## 2015-12-19 DIAGNOSIS — E1165 Type 2 diabetes mellitus with hyperglycemia: Secondary | ICD-10-CM

## 2015-12-19 DIAGNOSIS — Z9119 Patient's noncompliance with other medical treatment and regimen: Secondary | ICD-10-CM | POA: Diagnosis not present

## 2015-12-19 DIAGNOSIS — I10 Essential (primary) hypertension: Secondary | ICD-10-CM | POA: Diagnosis not present

## 2015-12-19 DIAGNOSIS — IMO0001 Reserved for inherently not codable concepts without codable children: Secondary | ICD-10-CM

## 2015-12-19 DIAGNOSIS — I2510 Atherosclerotic heart disease of native coronary artery without angina pectoris: Secondary | ICD-10-CM | POA: Diagnosis not present

## 2015-12-19 DIAGNOSIS — Z794 Long term (current) use of insulin: Secondary | ICD-10-CM | POA: Diagnosis not present

## 2015-12-19 DIAGNOSIS — Z5320 Procedure and treatment not carried out because of patient's decision for unspecified reasons: Secondary | ICD-10-CM

## 2015-12-19 LAB — POCT GLYCOSYLATED HEMOGLOBIN (HGB A1C): Hemoglobin A1C: 8.8

## 2015-12-19 LAB — GLUCOSE, POCT (MANUAL RESULT ENTRY): POC GLUCOSE: 224 mg/dL — AB (ref 70–99)

## 2015-12-19 MED ORDER — CARVEDILOL 25 MG PO TABS: ORAL_TABLET | ORAL | Status: DC

## 2015-12-19 MED ORDER — ATORVASTATIN CALCIUM 80 MG PO TABS: 80.0000 mg | ORAL_TABLET | Freq: Every day | ORAL | Status: DC

## 2015-12-19 MED ORDER — INSULIN GLARGINE 100 UNIT/ML SOLOSTAR PEN
24.0000 [IU] | PEN_INJECTOR | Freq: Every day | SUBCUTANEOUS | Status: DC
Start: 2015-12-19 — End: 2016-11-23

## 2015-12-19 MED ORDER — LISINOPRIL 10 MG PO TABS: 10.0000 mg | ORAL_TABLET | Freq: Every day | ORAL | Status: DC

## 2015-12-19 NOTE — Patient Instructions (Signed)
I have increased your Lisinopril to 10 mg per day.

## 2015-12-19 NOTE — Progress Notes (Signed)
Patient's here for HTN and Diabetes f/up. Patient reports feeling all right this morning.  Patient requesting medication refills.  Patient reports taken BP meds at 8:45 am this morning.

## 2015-12-19 NOTE — Progress Notes (Signed)
Patient ID: Timothy Lambert, male   DOB: [birth year], 60 y.o.   MRN: [id number] SUBJECTIVE: 60 y.o. male for follow up of diabetes and HTN. Diabetic Review of Systems - medication compliance: compliant all of the time, diabetic diet compliance: noncompliant much of the time, further diabetic ROS: no polyuria or polydipsia, no chest pain, dyspnea or TIA's, no numbness, tingling or pain in extremities, no unusual visual symptoms, no hypoglycemia, last eye exam approximately 2 months ago.  Patient admits to eating several raisin cakes per week and other sweet snacks. He checks his blood pressure at home which is usually 160.   Current Outpatient Prescriptions  Medication Sig Dispense Refill  . aspirin 81 MG chewable tablet Chew 1 tablet (81 mg total) by mouth daily.    Marland Kitchen atorvastatin (LIPITOR) 80 MG tablet Take 1 tablet (80 mg total) by mouth daily at 6 PM. 30 tablet 3  . carvedilol (COREG) 25 MG tablet TAKE 1 TABLET BY MOUTH TWICE DAILY WITH A MEAL 60 tablet 2  . cephALEXin (KEFLEX) 500 MG capsule Take 1 capsule (500 mg total) by mouth 4 (four) times daily. 20 capsule 0  . folic acid (FOLVITE) 1 MG tablet TAKE 1 TABLET BY MOUTH DAILY 30 tablet 1  . glucose blood (TRUE METRIX BLOOD GLUCOSE TEST) test strip Use as instructed 100 each 12  . Insulin Glargine (LANTUS SOLOSTAR) 100 UNIT/ML Solostar Pen Inject 24 Units into the skin daily at 10 pm. 30 mL 3  . Insulin Pen Needle (B-D UF III MINI PEN NEEDLES) 31G X 5 MM MISC Check blood sugar TID & QHS 50 each 2  . Insulin Syringe-Needle U-100 (INSULIN SYRINGE .5CC/30GX1/2") 30G X 1/2" 0.5 ML MISC Check blood sugar TID & QHS 100 each 2  . isosorbide-hydrALAZINE (BIDIL) 20-37.5 MG tablet Take 2 tablets by mouth 3 (three) times daily. 180 tablet 12  . lisinopril (PRINIVIL,ZESTRIL) 5 MG tablet Take 1 tablet (5 mg total) by mouth daily. 30 tablet 3  . ticagrelor (BRILINTA) 90 MG TABS tablet Take 1 tablet (90 mg total) by mouth 2 (two) times daily. 180 tablet 3  .  TRUEPLUS LANCETS 28G MISC 1 each by Does not apply route 3 (three) times daily before meals. 100 each 12   No current facility-administered medications for this visit.  ROS. Other than what is stated in HPI, all other systems are negative.   OBJECTIVE: Appearance: alert, well appearing, and in no distress, oriented to person, place, and time and normal appearing weight. BP 168/98 mmHg  Pulse 92  Temp(Src) 98 F (36.7 C) (Oral)  Resp 16  Ht 5\' 6"  (1.676 m)  Wt 162 lb (73.483 kg)  BMI 26.16 kg/m2  SpO2 100%  Exam: heart sounds normal rate, regular rhythm, normal S1, S2, no murmurs, rubs, clicks or gallops, no JVD, chest clear, no hepatosplenomegaly, no carotid bruits, no edema  ASSESSMENT: Timothy Lambert was seen today for diabetes and hypertension.  Diagnoses and all orders for this visit:  Uncontrolled diabetes mellitus type 2 without complications, unspecified long term insulin use status (HCC) -     POCT glycosylated hemoglobin (Hb A1C) -     POCT glucose (manual entry) -     Microalbumin, urine -     Insulin Glargine (LANTUS SOLOSTAR) 100 UNIT/ML Solostar Pen; Inject 24 Units into the skin daily at 10 pm. Patients diabetes remains uncontrolled as evidence by hemoglobin a1c >8.  Patient has been non-compliant with medication regimen. Stressed the multiple complications associated with uncontrolled  diabetes.  Patient will stay on current medication dose and report back to clinic with cbg log in 2 weeks.  Essential hypertension -     lisinopril (PRINIVIL,ZESTRIL) 10 MG tablet; Take 1 tablet (10 mg total) by mouth daily. -     carvedilol (COREG) 25 MG tablet; TAKE 1 TABLET BY MOUTH TWICE DAILY WITH A MEAL BP elevated today and has been on other visits. I have increased his lisinopril to 10 mg daily.   Coronary artery disease involving native coronary artery of native heart without angina pectoris -     atorvastatin (LIPITOR) 80 MG tablet; Take 1 tablet (80 mg total) by mouth daily at 6  PM. Education provided on proper lifestyle changes in order to lower cholesterol. Patient advised to maintain healthy weight and to keep total fat intake at 25-35% of total calories and carbohydrates 50-60% of total daily calories. Explained how high cholesterol places patient at risk for heart disease. Patient placed on appropriate medication and repeat labs in 6 months   Colonoscopy refused Stressed importance of colon cancer screening especially in high risk population.  PLAN: See orders for this visit as documented in the electronic medical record. Issues reviewed with him: diabetic diet discussed in detail, written exchange diet given, low cholesterol diet, weight control and daily exercise discussed, annual eye examinations at Ophthalmology discussed, glycohemoglobin and other lab monitoring discussed and long term diabetic complications discussed.   Return for DM/HTN.  Timothy Bosch, NP 12/19/2015 10:16 AM

## 2015-12-19 NOTE — Addendum Note (Signed)
Addended by: Chari Manning A on: 12/19/2015 11:58 AM   Modules accepted: Orders

## 2015-12-24 MED FILL — FOLIC ACID 1 MG TABLET: 1 | 30 days supply | Qty: 30 | Fill #1

## 2015-12-24 MED FILL — LANTUS SOLOSTAR 100 UNITS/M: 100 | 30 days supply | Qty: 9 | Fill #3

## 2015-12-24 MED FILL — BIDIL TABLET: 20-37.5 | 30 days supply | Qty: 180 | Fill #1

## 2015-12-25 MED FILL — LISINOPRIL 10 MG TABLET: 10 | 30 days supply | Qty: 30 | Fill #0

## 2016-01-06 MED FILL — BRILINTA 90 MG TABLET: 90 | 30 days supply | Qty: 60 | Fill #4

## 2016-01-28 MED FILL — FOLIC ACID 1 MG TABLET: 1 | 30 days supply | Qty: 30 | Fill #2

## 2016-01-28 MED FILL — CARVEDILOL 25 MG TABLET: 25 | 30 days supply | Qty: 60 | Fill #1

## 2016-01-28 MED FILL — ATORVASTATIN 80 MG TABLET: 80 | 30 days supply | Qty: 30 | Fill #0

## 2016-01-28 MED FILL — LANTUS SOLOSTAR 100 UNITS/M: 100 | 25 days supply | Qty: 6 | Fill #0

## 2016-01-28 MED FILL — LISINOPRIL 10 MG TABLET: 10 | 30 days supply | Qty: 30 | Fill #1

## 2016-02-03 MED FILL — BIDIL TABLET: 20-37.5 | 30 days supply | Qty: 180 | Fill #0

## 2016-02-03 MED FILL — BRILINTA 90 MG TABLET: 90 | 30 days supply | Qty: 60 | Fill #5

## 2016-02-19 MED FILL — LANTUS SOLOSTAR 100 UNITS/M: 100 | 25 days supply | Qty: 6 | Fill #1

## 2016-03-27 MED FILL — ATORVASTATIN 80 MG TABLET: 80 | 30 days supply | Qty: 30 | Fill #1

## 2016-03-27 MED FILL — ?CARVEDILOL 25 MG TABLET: 25 | 30 days supply | Qty: 60 | Fill #2

## 2016-03-27 MED FILL — ULTICARE PEN NDL 4MM 32G: 32G X 4 MM | 12 days supply | Qty: 50 | Fill #1

## 2016-03-27 MED FILL — BRILINTA 90 MG TABLET: 90 | 30 days supply | Qty: 60 | Fill #6

## 2016-03-27 MED FILL — BIDIL TABLET: 20-37.5 | 30 days supply | Qty: 180 | Fill #1

## 2016-03-27 MED FILL — ?LISINOPRIL 10 MG TABLET: 10 | 30 days supply | Qty: 30 | Fill #2

## 2016-03-27 MED FILL — !LANTUS SOLOSTAR 100UNITS/M: 100 | 25 days supply | Qty: 6 | Fill #2

## 2016-04-27 ENCOUNTER — Other Ambulatory Visit: Payer: Self-pay | Admitting: Internal Medicine

## 2016-04-27 MED FILL — ?LISINOPRIL 10 MG TABLET: 10 | 30 days supply | Qty: 30 | Fill #3

## 2016-04-27 MED FILL — LANTUS SOLOSTAR 100 UNITS/M: 100 | 25 days supply | Qty: 6 | Fill #3

## 2016-04-28 MED FILL — FOLIC ACID 1 MG TABLET: 1 | 30 days supply | Qty: 30 | Fill #0

## 2016-05-27 MED FILL — BIDIL TABLET: 20-37.5 | 30 days supply | Qty: 180 | Fill #2

## 2016-06-04 MED FILL — LANTUS SOLOSTAR 100 UNITS/M: 100 | 25 days supply | Qty: 6 | Fill #4

## 2016-06-15 ENCOUNTER — Other Ambulatory Visit: Payer: Self-pay | Admitting: Internal Medicine

## 2016-07-02 ENCOUNTER — Other Ambulatory Visit: Payer: Self-pay | Admitting: Internal Medicine

## 2016-07-02 MED FILL — LANTUS SOLOSTAR 100 UNITS/M: 100 | 25 days supply | Qty: 6 | Fill #5

## 2016-07-02 MED FILL — ULTICARE PEN NDL 4MM 32G: 32G X 4 MM | 25 days supply | Qty: 100 | Fill #0

## 2016-08-17 MED FILL — !LANTUS SOLOSTAR 100UNITS/M: 100 | 25 days supply | Qty: 6 | Fill #6

## 2016-09-23 MED FILL — LANTUS SOLOSTAR 100 UNITS/M: 100 | 25 days supply | Qty: 6 | Fill #7

## 2016-10-28 MED FILL — LANTUS SOLOSTAR 100 UNITS/M: 100 | 25 days supply | Qty: 6 | Fill #8

## 2016-11-09 ENCOUNTER — Ambulatory Visit: Payer: Medicaid Other | Attending: Internal Medicine

## 2016-11-12 ENCOUNTER — Other Ambulatory Visit: Payer: Self-pay | Admitting: *Deleted

## 2016-11-12 MED ORDER — ISOSORB DINITRATE-HYDRALAZINE 20-37.5 MG PO TABS: 2.0000 | ORAL_TABLET | Freq: Three times a day (TID) | ORAL | 3 refills | Status: DC

## 2016-11-12 MED ORDER — TICAGRELOR 90 MG PO TABS: ORAL_TABLET | ORAL | 3 refills | Status: DC

## 2016-11-12 NOTE — Telephone Encounter (Signed)
PRINTED FOR PASS PROGRAM 

## 2016-11-23 ENCOUNTER — Other Ambulatory Visit: Payer: Self-pay | Admitting: *Deleted

## 2016-11-23 DIAGNOSIS — IMO0001 Reserved for inherently not codable concepts without codable children: Secondary | ICD-10-CM

## 2016-11-23 DIAGNOSIS — E1165 Type 2 diabetes mellitus with hyperglycemia: Principal | ICD-10-CM

## 2016-11-23 MED ORDER — INSULIN GLARGINE 100 UNIT/ML SOLOSTAR PEN: 24.0000 [IU] | PEN_INJECTOR | Freq: Every day | SUBCUTANEOUS | 3 refills | Status: DC

## 2016-11-23 NOTE — Telephone Encounter (Signed)
PRINTED FOR PASS PROGRAM 

## 2016-12-04 MED FILL — $LANTUS SOLOSTAR 100 UNITS/: 100 | 24 days supply | Qty: 6 | Fill #9

## 2016-12-10 ENCOUNTER — Other Ambulatory Visit: Payer: Self-pay | Admitting: *Deleted

## 2016-12-10 MED ORDER — ISOSORB DINITRATE-HYDRALAZINE 20-37.5 MG PO TABS: 2.0000 | ORAL_TABLET | Freq: Three times a day (TID) | ORAL | 3 refills | Status: DC

## 2016-12-10 NOTE — Telephone Encounter (Signed)
PRINTED FOR PASS PROGRAM 

## 2016-12-13 IMAGING — CR DG CHEST 1V PORT
1 series · 1 of 1 positions shown · non-contrast
Comparison: None.

CLINICAL DATA: Central line placement and gastric tube placement

EXAM:
PORTABLE CHEST - 1 VIEW

[AP]
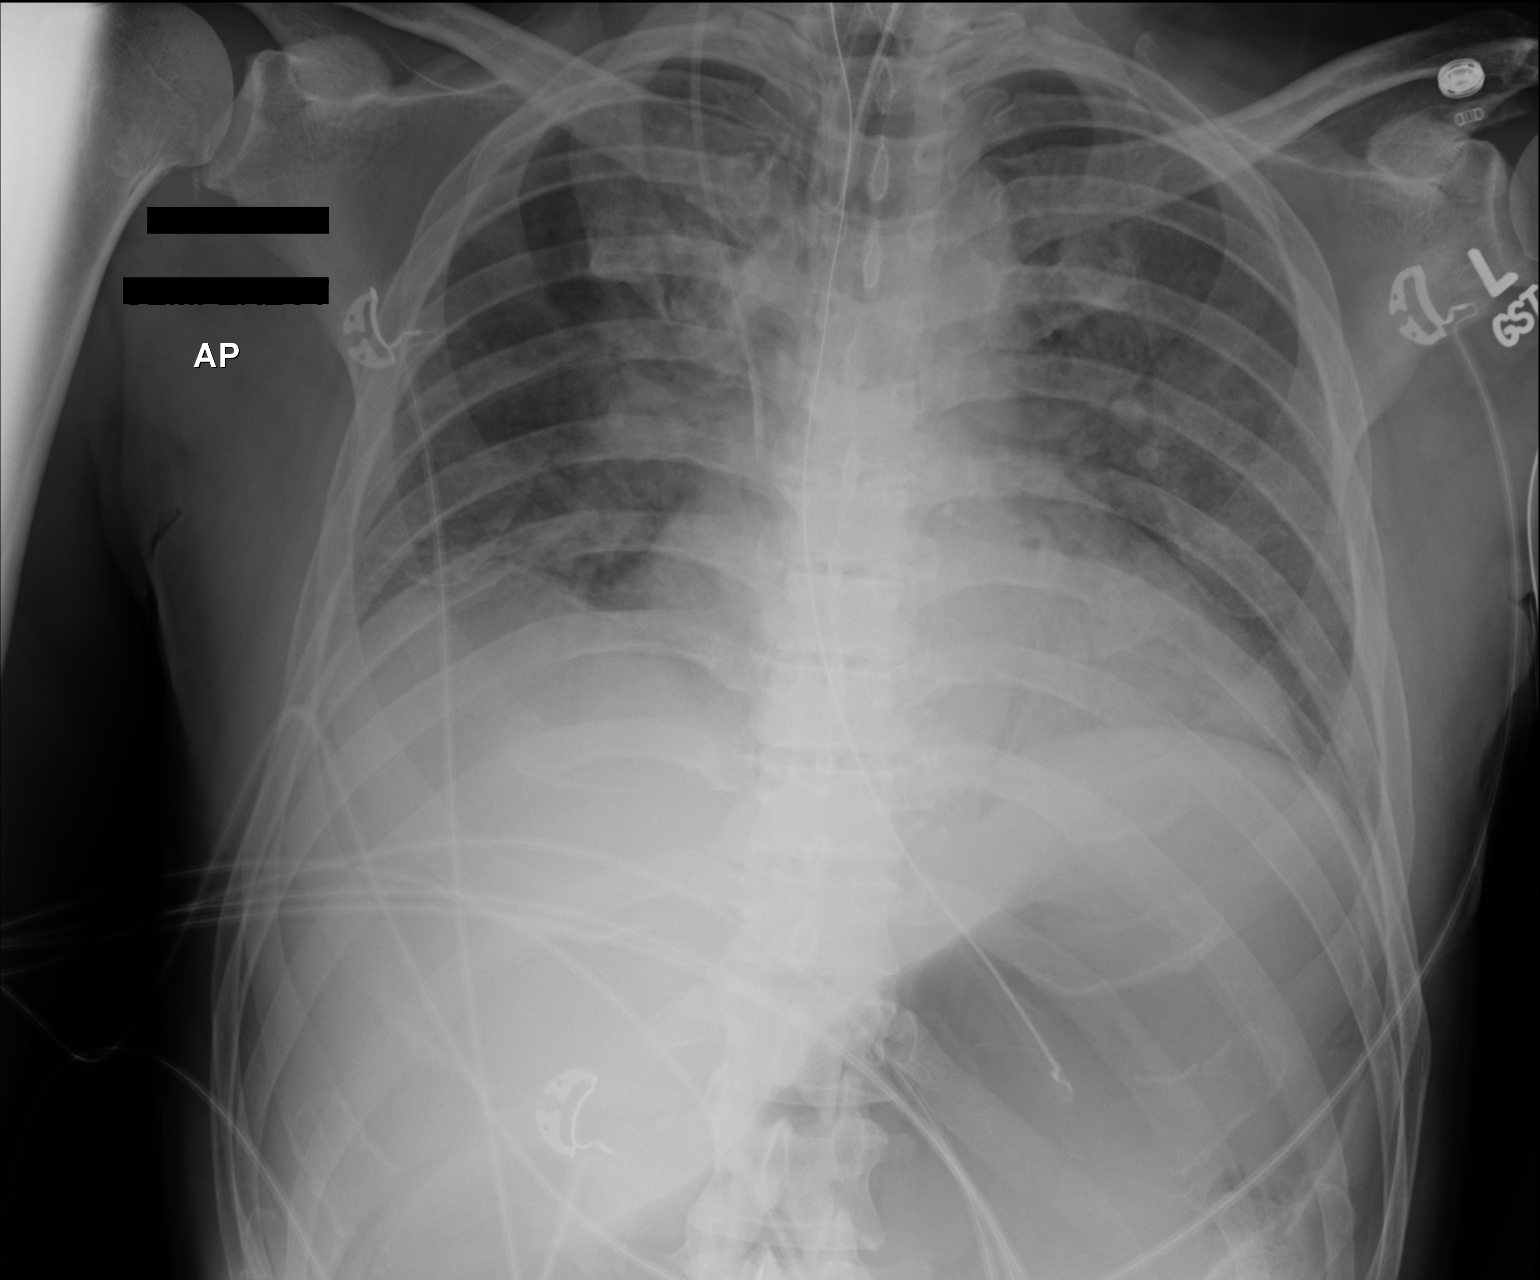

[1 of 1 positions shown; findings below may reference images not displayed]

FINDINGS: The endotracheal tube tip is 3.5 cm above the carina. The gastric
tube reaches the stomach but the side port is at or above the EG
junction. The tube should be advanced for optimal placement. The
right jugular central line extends into the SVC.

There is no pneumothorax. There is mild central ground-glass opacity
without dense focal airspace consolidation. There is no large
effusion.
IMPRESSION: Satisfactory ET tube position. Gastric tube reaches the stomach but
should be advanced for optimal placement. Central line is
satisfactorily positioned. No pneumothorax.

## 2016-12-14 IMAGING — MR MR HEAD W/O CM
9 of 11 series · 35 of 48 positions shown · non-contrast
Comparison: CT of the head January 23, 2015

CLINICAL DATA: Altered mental status, fell out of bed at [DATE] p.m.,
diaphoretic, witnessed seizure with incontinence. Possible alcohol
withdrawal.

EXAM:
MRI HEAD WITHOUT CONTRAST
TECHNIQUE: Multiplanar, multiecho pulse sequences of the brain and surrounding
structures were obtained without intravenous contrast.

[Series 5: DWI · axial · 3.0mm · 1.09mm/px · z∈[-47,+75]mm · 9 of 84 slices shown (1 of 4)]
[im 1/84]
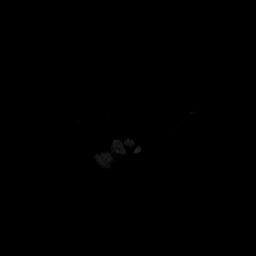
[im 11/84]
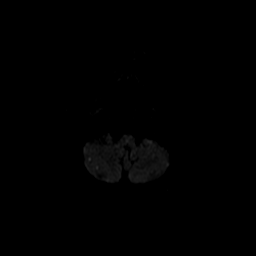
[im 21/84]
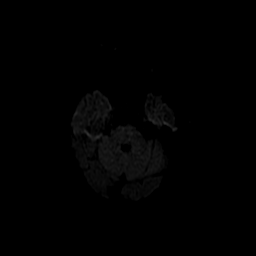
[im 32/84]
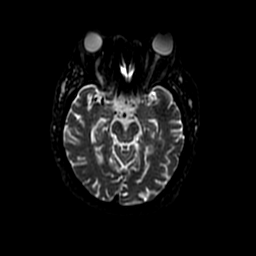
[im 42/84]
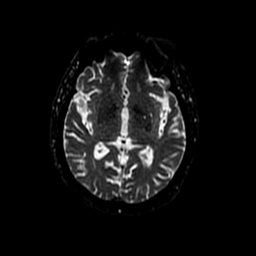
[im 52/84]
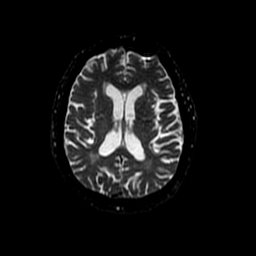
[im 63/84]
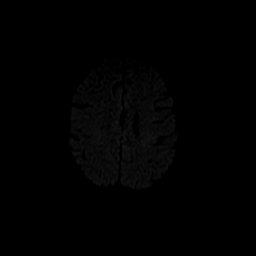
[im 73/84]
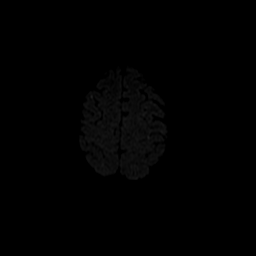
[im 84/84]
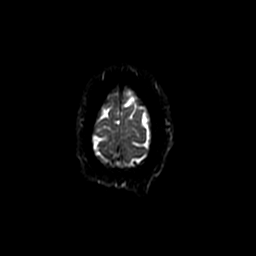

[Series 6: T1 · sagittal · 5.0mm · 0.47mm/px · 2 of 24 slices shown]
[im 1/24]
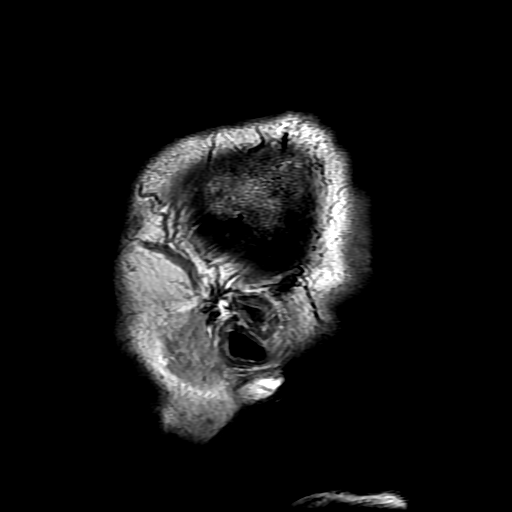
[im 12/24]
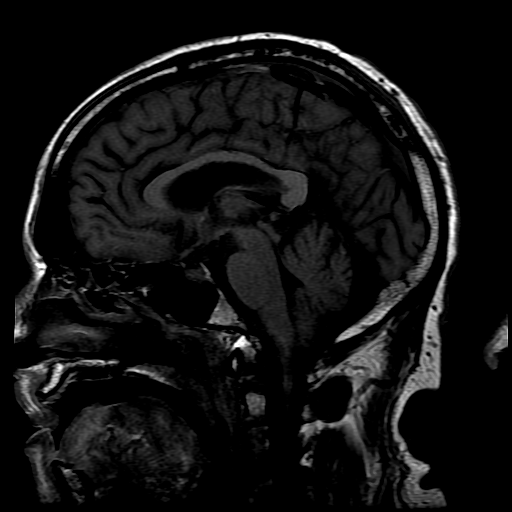

[Series 7: DWI · coronal · 5.0mm · 1.09mm/px · 7 of 66 slices shown (2 of 4)]
[im 1/66]
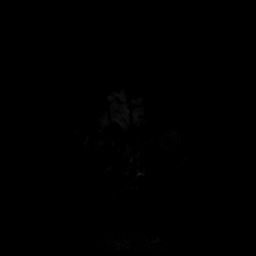
[im 11/66]
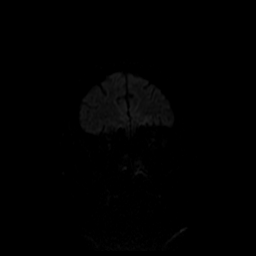
[im 22/66]
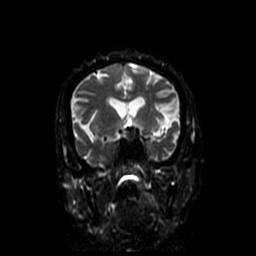
[im 33/66]
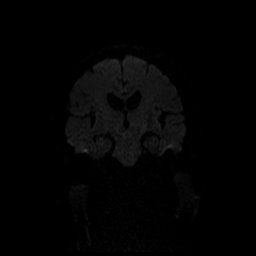
[im 44/66]
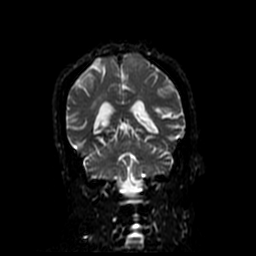
[im 55/66]
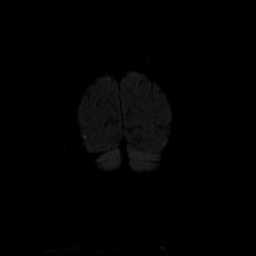
[im 66/66]
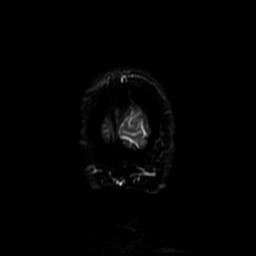

[Series 8: T2 · axial · 5.0mm · 0.43mm/px · z∈[-59,+84]mm · 2 of 25 slices shown (1 of 3)]
[im 1/25]
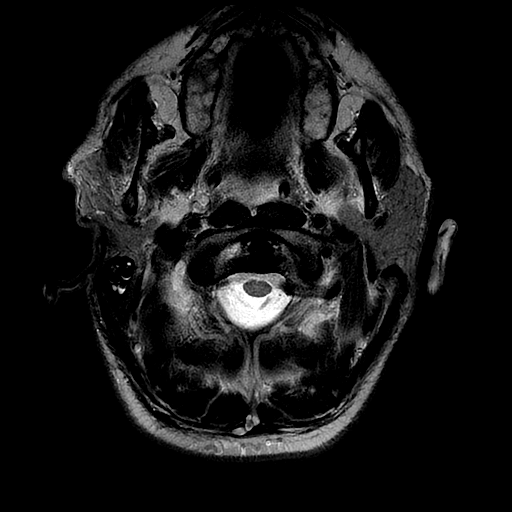
[im 25/25]
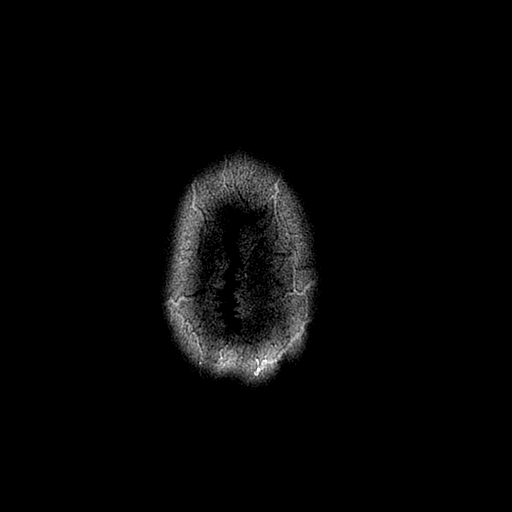

[Series 9: FLAIR · axial · 5.0mm · 0.43mm/px · z∈[-59,+84]mm · 2 of 25 slices shown]
[im 1/25]
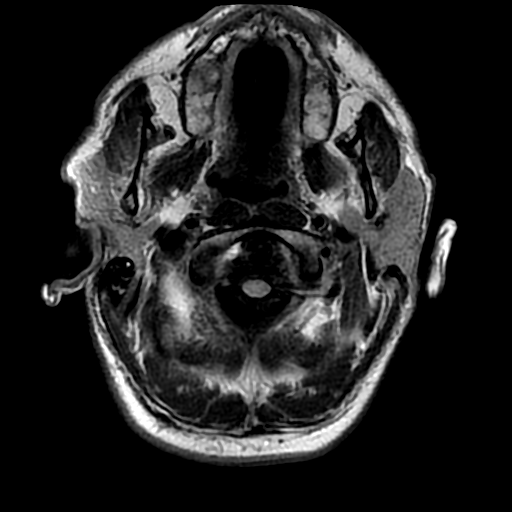
[im 25/25]
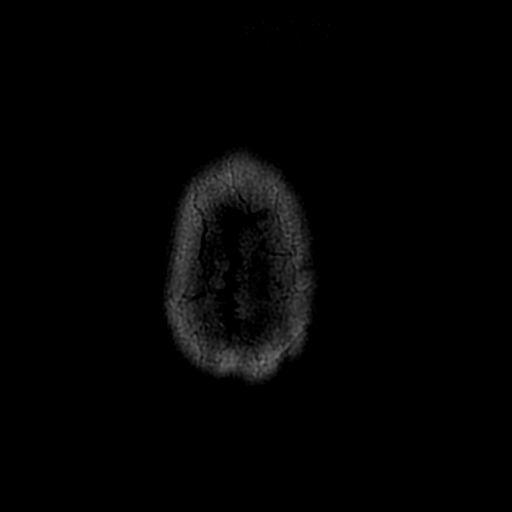

[Series 12: T2 · coronal · 3.0mm · 0.35mm/px · 3 of 27 slices shown (2 of 3)]
[im 1/27]
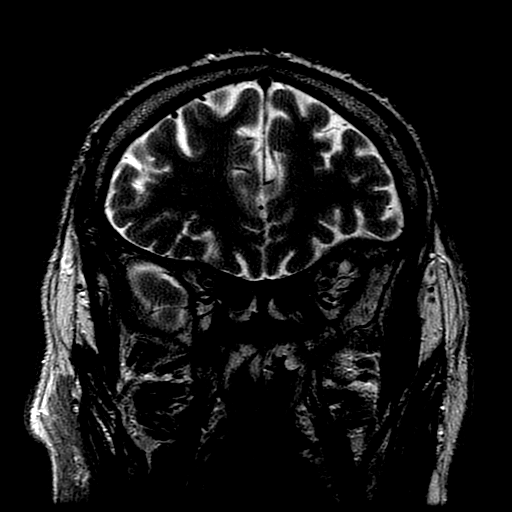
[im 14/27]
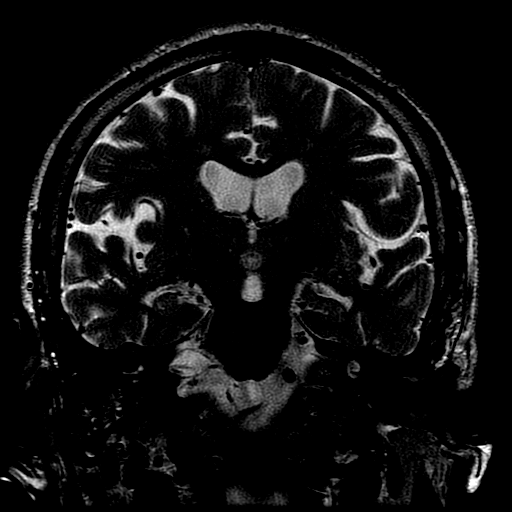
[im 27/27]
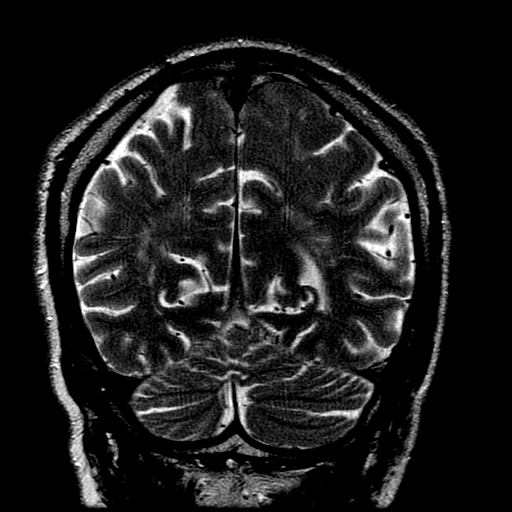

[Series 13: T2 · coronal · 5.0mm · 0.43mm/px · 3 of 30 slices shown (3 of 3)]
[im 1/30]
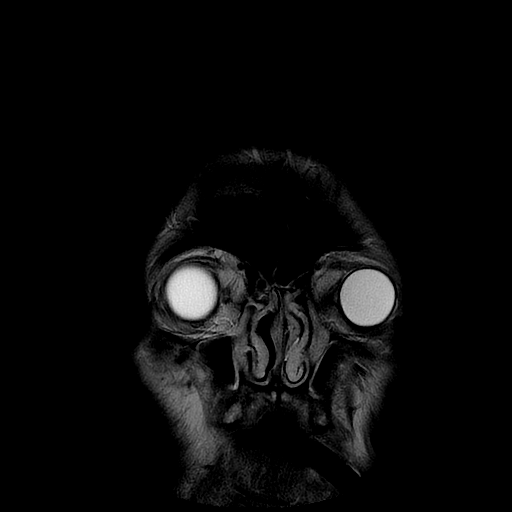
[im 15/30]
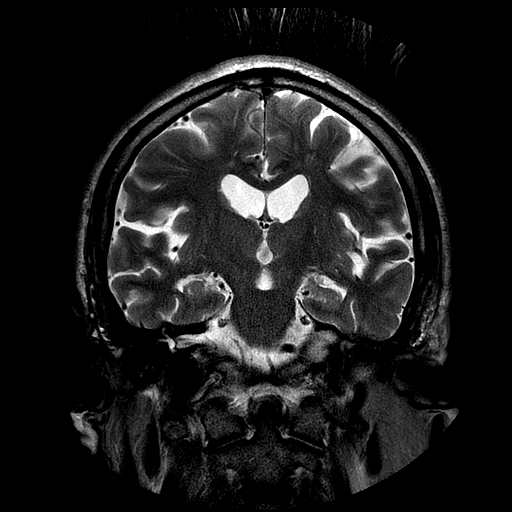
[im 30/30]
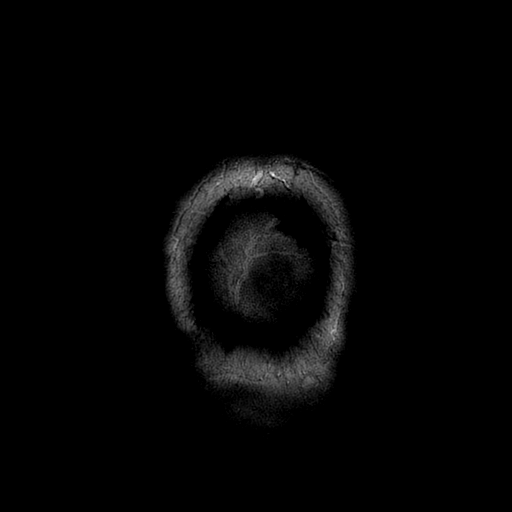

[Series 500: DWI · axial · 3.0mm · 1.09mm/px · z∈[-47,+75]mm · 4 of 42 slices shown (3 of 4)]
[im 1/42]
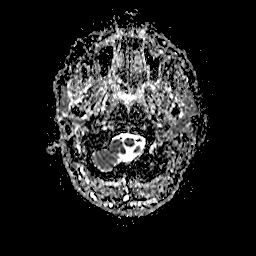
[im 14/42]
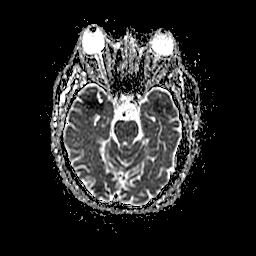
[im 28/42]
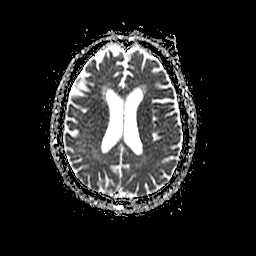
[im 42/42]
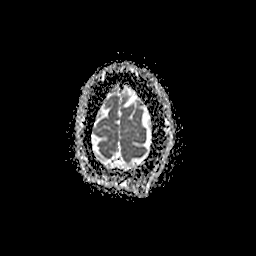

[Series 700: DWI · coronal · 5.0mm · 1.09mm/px · 3 of 33 slices shown (4 of 4)]
[im 1/33]
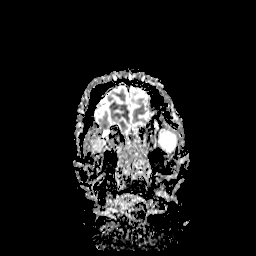
[im 17/33]
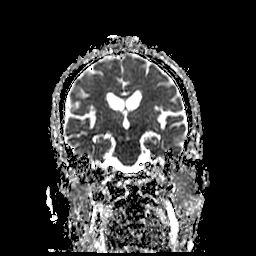
[im 33/33]
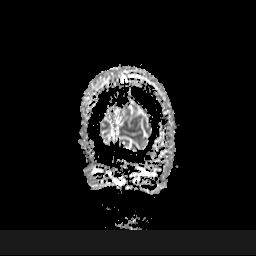

[35 of 48 positions shown; findings below may reference images not displayed]

FINDINGS: Multiple small foci of reduced diffusion and bifrontal lobes,
bilateral occipital lobes, RIGHT parietal lobe and, RIGHT
cerebellum, measuring up to 12 mm and RIGHT occipital lobe.
Corresponding low ADC values. No susceptibility artifact to suggest
hemorrhage. Ventricles and sulci are upper limits of normal in size
for patient's age. Patchy white matter T2 hyperintensities noted,
exclusive of the aforementioned abnormality.

No abnormal extra-axial fluid collections. Major intracranial
vascular flow voids seen at the skull base. Imaged ocular globes and
orbital contents are unremarkable. Mild paranasal sinus mucosal
thickening without air-fluid levels. Mild bilateral mastoid
effusions. No abnormal sellar expansion. No cerebellar tonsillar
ectopia. No abnormal calvarial bone marrow signal. Patient is
edentulous.
IMPRESSION: Multiple small foci of acute ischemia spanning multiple vascular
territories (involving the cerebrum and cerebellum) most consistent
with embolic event.

Parenchymal brain volume loss, upper limits of normal for age.
Minimal white matter changes suggest chronic small vessel ischemic
disease.

By: Juanluis Jean

## 2016-12-16 IMAGING — CR DG CHEST 1V PORT
1 series · 1 of 1 positions shown · non-contrast
Comparison: 01/25/2015

CLINICAL DATA: Acute respiratory failure

EXAM:
PORTABLE CHEST - 1 VIEW

[AP]
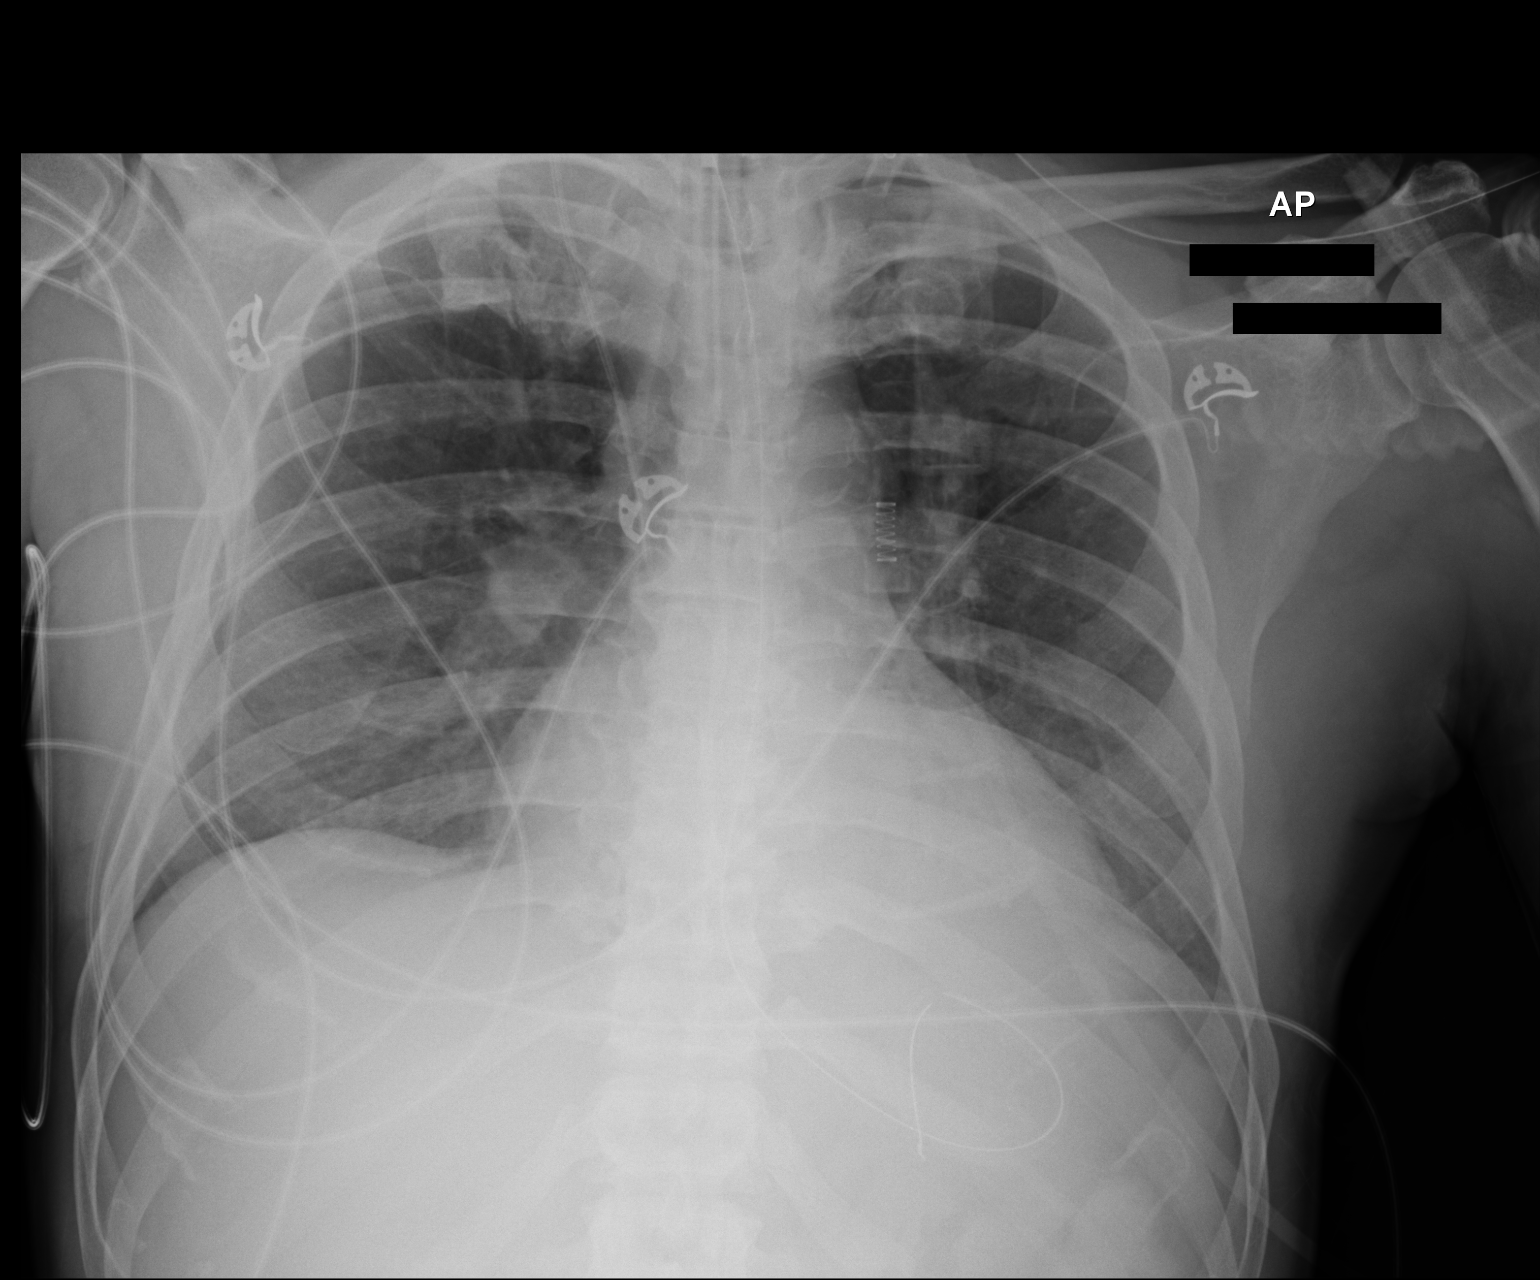

[1 of 1 positions shown; findings below may reference images not displayed]

FINDINGS: The endotracheal tube tip is 3.7 cm above the carina. The
nasogastric tube extends into the stomach. Mild airspace opacity
persists in the left base without significant interval change. There
is no pneumothorax.
IMPRESSION: Support equipment appears satisfactorily positioned.

No significant interval change in the bilateral airspace opacities.

## 2016-12-29 ENCOUNTER — Other Ambulatory Visit: Payer: Self-pay | Admitting: Internal Medicine

## 2016-12-29 DIAGNOSIS — IMO0001 Reserved for inherently not codable concepts without codable children: Secondary | ICD-10-CM

## 2016-12-29 DIAGNOSIS — E1165 Type 2 diabetes mellitus with hyperglycemia: Principal | ICD-10-CM

## 2016-12-31 MED FILL — $LANTUS SOLOSTAR 100 UNITS/: 100 | 25 days supply | Qty: 6 | Fill #0

## 2017-01-01 ENCOUNTER — Other Ambulatory Visit: Payer: Self-pay | Admitting: Internal Medicine

## 2017-01-04 MED FILL — TRUEPLUS PEN NDL 32GX5/32": 32G X 4 MM | 30 days supply | Qty: 100 | Fill #0

## 2017-01-04 MED FILL — TRUEPLUS PEN NDL 32GX5/32: 32G X 4 MM | 30 days supply | Qty: 100 | Fill #0

## 2017-01-26 MED FILL — $LANTUS SOLOSTAR 100 UNITS/: 100 | 25 days supply | Qty: 6 | Fill #1

## 2017-02-24 MED FILL — $LANTUS SOLOSTAR 100 UNITS/: 100 | 49 days supply | Qty: 12 | Fill #0

## 2017-04-19 MED FILL — $LANTUS SOLOSTAR 100 UNITS/: 100 | 49 days supply | Qty: 12 | Fill #1

## 2017-06-04 ENCOUNTER — Other Ambulatory Visit: Payer: Self-pay | Admitting: Internal Medicine

## 2017-06-04 MED FILL — $LANTUS SOLOSTAR 100 UNITS/: 100 | 49 days supply | Qty: 12 | Fill #2

## 2017-06-07 MED FILL — TRUEPLUS PEN NDL 32GX5/32": 32G X 4 MM | 30 days supply | Qty: 100 | Fill #0

## 2017-06-07 MED FILL — TRUEPLUS PEN NDL 32GX5/32: 32G X 4 MM | 30 days supply | Qty: 100 | Fill #0

## 2017-06-08 ENCOUNTER — Other Ambulatory Visit: Payer: Self-pay | Admitting: *Deleted

## 2017-06-08 MED ORDER — ISOSORB DINITRATE-HYDRALAZINE 20-37.5 MG PO TABS: 2.0000 | ORAL_TABLET | Freq: Three times a day (TID) | ORAL | 3 refills | Status: DC

## 2017-06-08 MED ORDER — TICAGRELOR 90 MG PO TABS: ORAL_TABLET | ORAL | 3 refills | Status: DC

## 2017-06-08 NOTE — Telephone Encounter (Signed)
PRINTED FOR PASS PROGRAM 

## 2017-07-18 ENCOUNTER — Encounter (HOSPITAL_COMMUNITY): Payer: Self-pay | Admitting: Emergency Medicine

## 2017-07-18 ENCOUNTER — Ambulatory Visit (HOSPITAL_COMMUNITY)
Admission: EM | Admit: 2017-07-18 | Discharge: 2017-07-18 | Disposition: A | Discharge status: Home / Self Care | Payer: Self-pay | Attending: Internal Medicine | Admitting: Internal Medicine

## 2017-07-18 ENCOUNTER — Telehealth (HOSPITAL_COMMUNITY): Payer: Self-pay | Admitting: Emergency Medicine

## 2017-07-18 DIAGNOSIS — Z9114 Patient's other noncompliance with medication regimen: Secondary | ICD-10-CM

## 2017-07-18 DIAGNOSIS — I160 Hypertensive urgency: Secondary | ICD-10-CM

## 2017-07-18 DIAGNOSIS — B9789 Other viral agents as the cause of diseases classified elsewhere: Secondary | ICD-10-CM

## 2017-07-18 DIAGNOSIS — J069 Acute upper respiratory infection, unspecified: Secondary | ICD-10-CM

## 2017-07-18 MED ORDER — CLONIDINE HCL 0.1 MG PO TABS
0.1000 mg | ORAL_TABLET | Freq: Once | ORAL | Status: AC
  Administered 2017-07-18: 0.1 mg via ORAL

## 2017-07-18 MED ORDER — LISINOPRIL 10 MG PO TABS: 10.0000 mg | ORAL_TABLET | Freq: Every day | ORAL | 0 refills | Status: DC

## 2017-07-18 MED ORDER — CLONIDINE HCL 0.1 MG PO TABS
ORAL_TABLET | ORAL | Status: AC
  Filled 2017-07-18: qty 1

## 2017-07-18 NOTE — Telephone Encounter (Signed)
Pt requested for lisinopril to be sent to [facility] instead of [facility].

## 2017-07-18 NOTE — ED Notes (Signed)
Pt left AMA... Notified Erin, PA  Pt signed hard copy of AMA... Placed in scan basket.... Electronic signature not working.

## 2017-07-18 NOTE — ED Provider Notes (Signed)
[facility]    CSN: [id number] Arrival date & time: 07/18/17  1204     History   Chief Complaint Chief Complaint  Patient presents with  . URI    HPI Timothy Lambert is a 61 y.o. male.   HPI  Timothy Lambert is a 61 y.o. male presenting to UC with c/o cold-like symptoms with nasal congestion and mild cough that started 2 days ago.  He states he was around someone 3 days ago who was recently discharged from the hospital due to pneumonia.  Pt denies chest pain, SOB, HA, sore throat, ear pain or dizziness but is bothered by the congestion he has.  He took plain Tylenol w/o relief.   In triage, pt found to have a BP of 221/126.  BP was checked several times per triage nurse.  Pt states his BP is high due to eating a sausage biscuit this morning for breakfast.  Pt refusing to go to emergency department due to lack of insurance and concern for bills he cannot afford.  Per medical records, pt had a stroke in 2016.  He has been off his BP medication for at least 6 months due to lack of insurance but pt states his insurance kicks in next week, in November and he plans to f/u with his PCP for refills on his medications and recheck of his symptoms.     Past Medical History:  Diagnosis Date  . Acute renal failure (ARF) ([place]) 02/19/2015  . CHF (congestive heart failure) ([place])   . Coronary artery disease   . GERD (gastroesophageal reflux disease)   . HLD (hyperlipidemia)   . Hypertension   . Myocardial infarction ([place]) 01/22/2015   "massive"  . Refusal of blood transfusions as patient is Jehovah's Witness   . Seizures ([place]) 01/22/2015   "in front of paramedics"  . Stroke [facility]) 01/22/2015   "short term memory loss & right side weak since" (02/19/2015)  . Type II diabetes mellitus [facility])     Patient Active Problem List   Diagnosis Date Noted  . Diabetic retinopathy ([place]) 11/18/2015  . Paroxysmal atrial fibrillation ([place]) 10/28/2015  . 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 Gait disturbance, post-stroke 05/13/2015  . Hypertension 02/25/2015  . Cough 02/25/2015  . Constipation 02/25/2015  . Generalized weakness 02/19/2015  . Essential hypertension 02/19/2015  . CAD (coronary artery disease) 02/19/2015  . Acute renal failure superimposed on stage 3 chronic kidney disease ([place]) 02/19/2015  . CHF (congestive heart failure) ([place]) 02/10/2015  . Hyperkalemia   . 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 Metabolic encephalopathy   . CVA (cerebral vascular accident) ([place])   . Dysphagia   . ST elevation (STEMI) myocardial infarction involving left anterior descending coronary artery ([place])   . 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 Paroxysmal atrial flutter ([place])   . Hypokalemia   . Diabetes type 2, uncontrolled ([place])   . HLD (hyperlipidemia)   . Altered mental status   . Hyperglycemia   . Seizure ([place])   . Acute embolic stroke ([place])   . Acute respiratory failure with hypoxia ([place]) 01/24/2015  . Acute renal failure syndrome ([place])   . Acute respiratory failure ([place])   . Hypertensive emergency 01/23/2015  . History of ST elevation myocardial infarction (STEMI) 01/23/2015  . Acute MI anterior wall first episode care [facility])   . Altered mental state     Past Surgical History:  Procedure Laterality Date  .  CARDIAC CATHETERIZATION N/A 01/23/2015   Procedure: Left Heart Cath and Coronary Angiography;  Surgeon: [name], MD;  Location: [facility] INVASIVE CV LAB CUPID;  Service: Cardiovascular;  Laterality: N/A;  . CARDIAC CATHETERIZATION N/A 01/23/2015   Procedure: Coronary Stent Intervention;  Surgeon: [name], MD;  Location: [facility] INVASIVE CV LAB CUPID;  Service: Cardiovascular;  Laterality: N/A;       Home Medications    Prior to Admission medications   Medication Sig Start Date End Date Taking? Authorizing Provider  aspirin 81 MG chewable tablet Chew 1 tablet (81 mg total) by mouth daily. 02/02/15   Cherene Altes,  MD  atorvastatin (LIPITOR) 80 MG tablet Take 1 tablet (80 mg total) by mouth daily at 6 PM. 12/19/15   Lance Bosch, NP  carvedilol (COREG) 25 MG tablet TAKE 1 TABLET BY MOUTH TWICE DAILY WITH A MEAL 12/19/15   Lance Bosch, NP  cephALEXin (KEFLEX) 500 MG capsule Take 1 capsule (500 mg total) by mouth 4 (four) times daily. 11/19/15   Margarita Mail, PA-C  folic acid (FOLVITE) 1 MG tablet TAKE 1 TABLET BY MOUTH DAILY 04/28/16   Angelica Chessman E, MD  glucose blood (TRUE METRIX BLOOD GLUCOSE TEST) test strip Use as instructed 03/27/15   Lance Bosch, NP  Insulin Glargine (LANTUS SOLOSTAR) 100 UNIT/ML Solostar Pen Inject 24 Units into the skin daily at 10 pm. 11/23/16   Tresa Garter, MD  Insulin Syringe-Needle U-100 (INSULIN SYRINGE .5CC/30GX1/2") 30G X 1/2" 0.5 ML MISC Check blood sugar TID & QHS 04/09/15   Advani, Vernon Prey, MD  isosorbide-hydrALAZINE (BIDIL) 20-37.5 MG tablet Take 2 tablets by mouth 3 (three) times daily. 06/08/17   Tresa Garter, MD  LANTUS SOLOSTAR 100 UNIT/ML Solostar Pen INJECT 24 UNITS INTO THE SKIN DAILY AT 10 PM. 12/29/16   Tresa Garter, MD  lisinopril (PRINIVIL,ZESTRIL) 10 MG tablet Take 1 tablet (10 mg total) by mouth daily. 12/19/15   Lance Bosch, NP  lisinopril (PRINIVIL,ZESTRIL) 10 MG tablet Take 1 tablet (10 mg total) by mouth daily. 07/18/17   Noe Gens, PA-C  ticagrelor (BRILINTA) 90 MG TABS tablet TAKE ONE TABLET 2 TIMES A DAY 06/08/17   Tresa Garter, MD  TRUEPLUS LANCETS 28G MISC 1 each by Does not apply route 3 (three) times daily before meals. 04/29/15   Lance Bosch, NP  TRUEPLUS PEN NEEDLES 32G X 4 MM MISC USE TO INJECT INSULIN AS DIRECTED 06/07/17   Tresa Garter, MD    Family History Family History  Problem Relation Age of Onset  . Diabetes Father   . Seizures Neg Hx     Social History Social History  Substance Use Topics  . Smoking status: Former Smoker    Packs/day: 0.33    Years: 14.00    Types:  Cigarettes  . Smokeless tobacco: Never Used     Comment: "quit smoking cigarettes in 1992"  . Alcohol use No     Comment: 02/19/2015 "stopped drinking 01/22/2015"     Allergies   Patient has no known allergies.   Review of Systems Review of Systems  Constitutional: Negative for chills and fever.  HENT: Positive for congestion. Negative for ear pain, sore throat, trouble swallowing and voice change.   Respiratory: Positive for cough. Negative for shortness of breath.   Cardiovascular: Negative for chest pain and palpitations.  Gastrointestinal: Negative for abdominal pain, diarrhea, nausea and vomiting.  Musculoskeletal: Negative for arthralgias, back pain and myalgias.  Skin: Negative for rash.  Neurological: Negative for dizziness, light-headedness and headaches.     Physical Exam Triage Vital Signs ED Triage Vitals [07/18/17 1231]  Enc Vitals Group     BP (!) 221/126     Pulse Rate (!) 117     Resp 18     Temp 98.5 F (36.9 C)     Temp Source Oral     SpO2 96 %     Weight      Height      Head Circumference      Peak Flow      Pain Score      Pain Loc      Pain Edu?      Excl. in [place]?    No data found.   Updated Vital Signs BP (!) 219/128 (BP Location: Left Arm)   Pulse (!) 115   Temp 98.5 F (36.9 C) (Oral)   Resp 18   SpO2 96%     Physical Exam  Constitutional: He is oriented to person, place, and time. He appears well-developed and well-nourished. No distress.  HENT:  Head: Normocephalic and atraumatic.  Right Ear: Tympanic membrane normal.  Left Ear: Tympanic membrane normal.  Nose: Nose normal.  Mouth/Throat: Uvula is midline, oropharynx is clear and moist and mucous membranes are normal.  Eyes: EOM are normal.  Neck: Normal range of motion.  Cardiovascular: Tachycardia present.   Pulmonary/Chest: Effort normal and breath sounds normal. No respiratory distress. He has no wheezes. He has no rales.  Musculoskeletal: Normal range of motion.    Neurological: He is alert and oriented to person, place, and time. No cranial nerve deficit.  CN II-XII in tact. Speech is clear. Alert to person, place and time. Normal finger to nose coordination. Able to follow 2 step commands. Normal gait.   Skin: Skin is warm and dry. He is not diaphoretic.  Psychiatric: He has a normal mood and affect. His behavior is normal.  Nursing note and vitals reviewed.    UC Treatments / Results  Labs (all labs ordered are listed, but only abnormal results are displayed) Labs Reviewed - No data to display  EKG  EKG Interpretation None       Radiology No results found.  Procedures Procedures (including critical care time)  Medications Ordered in UC Medications  cloNIDine (CATAPRES) tablet 0.1 mg (0.1 mg Oral Given 07/18/17 1300)     Initial Impression / Assessment and Plan / UC Course  I have reviewed the triage vital signs and the nursing notes.  Pertinent labs & imaging results that were available during my care of the patient were reviewed by me and considered in my medical decision making (see chart for details).     Discussed dangerously high blood pressure, high risk of having another stroke. Pt refusing to go to emergency department.    Will give pt 0.1mg  clonidine in UC, recheck vitals.  If still dangerously high will have pt sign refusal form.  Will prescribe BP meds     BP improved minimally. Consulted with Dr. Valere Dross, agreed to give pt a second clonidine, however, pt left AMA prior to second dose being given. Nursing staff was able to give pt discharge papers. Lisinopril 10mg  refilled for pt for 2 weeks.   Pt stated he planned on calling his PCP to f/u next week once his insurance kicks in.  Strongly advised pt to go to ED or call 911, especially if symptoms worsen.  Final Clinical Impressions(s) / UC Diagnoses   Final diagnoses:  Viral URI with cough  Hypertensive urgency  Non compliance w medication regimen     New Prescriptions Discharge Medication List as of 07/18/2017  1:37 PM    START taking these medications   Details  !! lisinopril (PRINIVIL,ZESTRIL) 10 MG tablet Take 1 tablet (10 mg total) by mouth daily., Starting Sun 07/18/2017, Normal     !! - Potential duplicate medications found. Please discuss with provider.       Controlled Substance Prescriptions [place] Controlled Substance Registry consulted? Not Applicable   Tyrell Antonio 07/18/17 2258

## 2017-07-18 NOTE — ED Triage Notes (Signed)
Pt c/o cold sx onset: 3 days  Sx include: prod cough, nasal congestion  Denies: fevers, chills  Taking: OTC cold meds w/temp relief.   Pt BP now is 221/126 ... Denies CP, SOB, HA, blurred vision, diaphoresis  Has been out of BP meds x6 months.... sts he lost his health insurance.   A&O x4... NAD

## 2017-07-18 NOTE — Discharge Instructions (Signed)
° °  Your blood pressure today is dangerously high putting you at an increased risk for stroke, heart failure or even death.    Please do not hesitate to call 911 or go to closest emergency department for further evaluation and treatment of your blood pressure.  For your cough and congestion, your lungs are clear. No evidence of pneumonia or bronchitis at this time. Be sure to stay well hydrated. You may take plain mucinex or Robitussin- guaifenesin.  If you develop fever or shortness of breath, please do no hesitate to be reevaluated but antibiotics are not indicated at this time.

## 2017-07-28 MED FILL — $LANTUS SOLOSTAR 100 UNITS/: 100 | 49 days supply | Qty: 12 | Fill #3

## 2017-09-15 ENCOUNTER — Other Ambulatory Visit: Payer: Self-pay | Admitting: Internal Medicine

## 2017-09-15 MED FILL — $LANTUS SOLOSTAR 100 UNITS/: 100 | 50 days supply | Qty: 12 | Fill #4

## 2017-11-04 ENCOUNTER — Other Ambulatory Visit: Payer: Self-pay | Admitting: Internal Medicine

## 2017-11-04 DIAGNOSIS — IMO0001 Reserved for inherently not codable concepts without codable children: Secondary | ICD-10-CM

## 2017-11-04 DIAGNOSIS — E1165 Type 2 diabetes mellitus with hyperglycemia: Principal | ICD-10-CM

## 2017-11-09 ENCOUNTER — Other Ambulatory Visit: Payer: Self-pay | Admitting: Internal Medicine

## 2017-11-09 DIAGNOSIS — E1165 Type 2 diabetes mellitus with hyperglycemia: Principal | ICD-10-CM

## 2017-11-09 DIAGNOSIS — IMO0001 Reserved for inherently not codable concepts without codable children: Secondary | ICD-10-CM

## 2017-11-16 ENCOUNTER — Other Ambulatory Visit: Payer: Self-pay

## 2017-11-16 ENCOUNTER — Emergency Department (HOSPITAL_COMMUNITY)
Admission: EM | Admit: 2017-11-16 | Discharge: 2017-11-16 | Disposition: A | Discharge status: Home / Self Care | Payer: Medicare Other | Attending: Emergency Medicine | Admitting: Emergency Medicine

## 2017-11-16 ENCOUNTER — Encounter (HOSPITAL_COMMUNITY): Payer: Self-pay | Admitting: Emergency Medicine

## 2017-11-16 ENCOUNTER — Other Ambulatory Visit: Payer: Self-pay | Admitting: Internal Medicine

## 2017-11-16 DIAGNOSIS — Z794 Long term (current) use of insulin: Secondary | ICD-10-CM | POA: Diagnosis not present

## 2017-11-16 DIAGNOSIS — W06XXXA Fall from bed, initial encounter: Secondary | ICD-10-CM | POA: Insufficient documentation

## 2017-11-16 DIAGNOSIS — I130 Hypertensive heart and chronic kidney disease with heart failure and stage 1 through stage 4 chronic kidney disease, or unspecified chronic kidney disease: Secondary | ICD-10-CM | POA: Diagnosis not present

## 2017-11-16 DIAGNOSIS — N183 Chronic kidney disease, stage 3 (moderate): Secondary | ICD-10-CM | POA: Diagnosis not present

## 2017-11-16 DIAGNOSIS — I5040 Unspecified combined systolic (congestive) and diastolic (congestive) heart failure: Secondary | ICD-10-CM | POA: Diagnosis not present

## 2017-11-16 DIAGNOSIS — Z760 Encounter for issue of repeat prescription: Secondary | ICD-10-CM | POA: Diagnosis not present

## 2017-11-16 DIAGNOSIS — I2510 Atherosclerotic heart disease of native coronary artery without angina pectoris: Secondary | ICD-10-CM | POA: Diagnosis not present

## 2017-11-16 DIAGNOSIS — Z7982 Long term (current) use of aspirin: Secondary | ICD-10-CM | POA: Diagnosis not present

## 2017-11-16 DIAGNOSIS — E119 Type 2 diabetes mellitus without complications: Secondary | ICD-10-CM | POA: Diagnosis present

## 2017-11-16 DIAGNOSIS — E1122 Type 2 diabetes mellitus with diabetic chronic kidney disease: Secondary | ICD-10-CM | POA: Insufficient documentation

## 2017-11-16 DIAGNOSIS — Z79899 Other long term (current) drug therapy: Secondary | ICD-10-CM | POA: Insufficient documentation

## 2017-11-16 DIAGNOSIS — E1165 Type 2 diabetes mellitus with hyperglycemia: Secondary | ICD-10-CM

## 2017-11-16 DIAGNOSIS — Z87891 Personal history of nicotine dependence: Secondary | ICD-10-CM | POA: Insufficient documentation

## 2017-11-16 DIAGNOSIS — W19XXXA Unspecified fall, initial encounter: Secondary | ICD-10-CM

## 2017-11-16 DIAGNOSIS — Y9389 Activity, other specified: Secondary | ICD-10-CM | POA: Insufficient documentation

## 2017-11-16 DIAGNOSIS — Y999 Unspecified external cause status: Secondary | ICD-10-CM | POA: Insufficient documentation

## 2017-11-16 DIAGNOSIS — Y92003 Bedroom of unspecified non-institutional (private) residence as the place of occurrence of the external cause: Secondary | ICD-10-CM | POA: Insufficient documentation

## 2017-11-16 DIAGNOSIS — IMO0001 Reserved for inherently not codable concepts without codable children: Secondary | ICD-10-CM

## 2017-11-16 DIAGNOSIS — I10 Essential (primary) hypertension: Secondary | ICD-10-CM

## 2017-11-16 MED ORDER — LISINOPRIL 10 MG PO TABS: 10.0000 mg | ORAL_TABLET | Freq: Every day | ORAL | 3 refills | Status: DC

## 2017-11-16 MED ORDER — "INSULIN SYRINGE 30G X 1/2"" 0.5 ML MISC": 2 refills | Status: DC

## 2017-11-16 MED ORDER — ISOSORB DINITRATE-HYDRALAZINE 20-37.5 MG PO TABS: 2.0000 | ORAL_TABLET | Freq: Three times a day (TID) | ORAL | 0 refills | Status: DC

## 2017-11-16 MED ORDER — INSULIN GLARGINE 100 UNIT/ML SOLOSTAR PEN: 24.0000 [IU] | PEN_INJECTOR | Freq: Every day | SUBCUTANEOUS | 0 refills | Status: DC

## 2017-11-16 MED FILL — LISINOPRIL 10 MG TABS: 10 | 30 days supply | Qty: 30 | Fill #0

## 2017-11-16 MED FILL — $LANTUS SOLOSTAR 100 UNITS/: 100 | 25 days supply | Qty: 6 | Fill #0

## 2017-11-16 MED FILL — BIDIL TABLET: 20-37.5 | 30 days supply | Qty: 180 | Fill #0

## 2017-11-16 NOTE — ED Provider Notes (Signed)
[facility] EMERGENCY DEPARTMENT Provider Note   CSN: [id number] Arrival date & time: 11/16/17  0004     History   Chief Complaint Chief Complaint  Patient presents with  . Fall    HPI Timothy Lambert is a 62 y.o. male.  Patient is a 62 year old male with past medical history of diabetes and hypertension.  He presents for evaluation of fall.  Tells me that he rolled out of bed earlier this evening and was uninjured.  He believes this may have occurred because he has been off of his medications for the past 2 weeks he denies any complaints at present, only requesting refills of his insulin and blood pressure medications.   The history is provided by the patient.  Fall  This is a new problem. The current episode started less than 1 hour ago. The problem occurs constantly. The problem has not changed since onset.Nothing aggravates the symptoms. Nothing relieves the symptoms.    Past Medical History:  Diagnosis Date  . Acute renal failure (ARF) ([place]) 02/19/2015  . CHF (congestive heart failure) ([place])   . Coronary artery disease   . GERD (gastroesophageal reflux disease)   . HLD (hyperlipidemia)   . Hypertension   . Myocardial infarction ([place]) 01/22/2015   "massive"  . Refusal of blood transfusions as patient is Jehovah's Witness   . Seizures ([place]) 01/22/2015   "in front of paramedics"  . Stroke [facility]) 01/22/2015   "short term memory loss & right side weak since" (02/19/2015)  . Type II diabetes mellitus [facility])     Patient Active Problem List   Diagnosis Date Noted  . Diabetic retinopathy ([place]) 11/18/2015  . Paroxysmal atrial fibrillation ([place]) 10/28/2015  . Cognitive deficit, post-stroke 05/13/2015  . Gait disturbance, post-stroke 05/13/2015  . Hypertension 02/25/2015  . Cough 02/25/2015  . Constipation 02/25/2015  . Generalized weakness 02/19/2015  . Essential hypertension 02/19/2015  . CAD (coronary artery disease) 02/19/2015  . Acute renal failure superimposed on stage  3 chronic kidney disease ([place]) 02/19/2015  . CHF (congestive heart failure) ([place]) 02/10/2015  . Hyperkalemia   . 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 Metabolic encephalopathy   . CVA (cerebral vascular accident) ([place])   . Dysphagia   . ST elevation (STEMI) myocardial infarction involving left anterior descending coronary artery ([place])   . 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 Paroxysmal atrial flutter ([place])   . Hypokalemia   . Diabetes type 2, uncontrolled ([place])   . HLD (hyperlipidemia)   . Altered mental status   . Hyperglycemia   . Seizure ([place])   . Acute embolic stroke ([place])   . Acute respiratory failure with hypoxia ([place]) 01/24/2015  . Acute renal failure syndrome ([place])   . Acute respiratory failure ([place])   . Hypertensive emergency 01/23/2015  . History of ST elevation myocardial infarction (STEMI) 01/23/2015  . Acute MI anterior wall first episode care [facility])   . Altered mental state     Past Surgical History:  Procedure Laterality Date  . CARDIAC CATHETERIZATION N/A 01/23/2015   Procedure: Left Heart Cath and Coronary Angiography;  Surgeon: [name], MD;  Location: [facility] INVASIVE CV LAB CUPID;  Service: Cardiovascular;  Laterality: N/A;  . CARDIAC CATHETERIZATION N/A 01/23/2015   Procedure: Coronary Stent Intervention;  Surgeon: [name], MD;  Location: [facility] INVASIVE CV LAB CUPID;  Service: Cardiovascular;  Laterality: N/A;       Home Medications    Prior to Admission medications  Medication Sig Start Date End Date Taking? Authorizing Provider  aspirin 81 MG chewable tablet Chew 1 tablet (81 mg total) by mouth daily. 02/02/15   Cherene Altes, MD  atorvastatin (LIPITOR) 80 MG tablet Take 1 tablet (80 mg total) by mouth daily at 6 PM. 12/19/15   Lance Bosch, NP  carvedilol (COREG) 25 MG tablet TAKE 1 TABLET BY MOUTH TWICE DAILY WITH A MEAL 12/19/15   Lance Bosch, NP  cephALEXin (KEFLEX) 500 MG capsule Take 1 capsule  (500 mg total) by mouth 4 (four) times daily. 11/19/15   Margarita Mail, PA-C  folic acid (FOLVITE) 1 MG tablet TAKE 1 TABLET BY MOUTH DAILY 04/28/16   Angelica Chessman E, MD  glucose blood (TRUE METRIX BLOOD GLUCOSE TEST) test strip Use as instructed 03/27/15   Lance Bosch, NP  Insulin Glargine (LANTUS SOLOSTAR) 100 UNIT/ML Solostar Pen Inject 24 Units into the skin daily at 10 pm. 11/23/16   Tresa Garter, MD  Insulin Syringe-Needle U-100 (INSULIN SYRINGE .5CC/30GX1/2") 30G X 1/2" 0.5 ML MISC Check blood sugar TID & QHS 04/09/15   Advani, Vernon Prey, MD  isosorbide-hydrALAZINE (BIDIL) 20-37.5 MG tablet Take 2 tablets by mouth 3 (three) times daily. 06/08/17   Tresa Garter, MD  LANTUS SOLOSTAR 100 UNIT/ML Solostar Pen INJECT 24 UNITS INTO THE SKIN DAILY AT 10 PM. 12/29/16   Tresa Garter, MD  lisinopril (PRINIVIL,ZESTRIL) 10 MG tablet Take 1 tablet (10 mg total) by mouth daily. 12/19/15   Lance Bosch, NP  lisinopril (PRINIVIL,ZESTRIL) 10 MG tablet Take 1 tablet (10 mg total) by mouth daily. 07/18/17   Noe Gens, PA-C  ticagrelor (BRILINTA) 90 MG TABS tablet TAKE ONE TABLET 2 TIMES A DAY 06/08/17   Tresa Garter, MD  TRUEPLUS LANCETS 28G MISC 1 each by Does not apply route 3 (three) times daily before meals. 04/29/15   Lance Bosch, NP  TRUEPLUS PEN NEEDLES 32G X 4 MM MISC USE TO INJECT INSULIN AS DIRECTED 06/07/17   Tresa Garter, MD    Family History Family History  Problem Relation Age of Onset  . Diabetes Father   . Seizures Neg Hx     Social History Social History   Tobacco Use  . Smoking status: Former Smoker    Packs/day: 0.33    Years: 14.00    Pack years: 4.62    Types: Cigarettes  . Smokeless tobacco: Never Used  . Tobacco comment: "quit smoking cigarettes in 1992"  Substance Use Topics  . Alcohol use: No    Comment: 02/19/2015 "stopped drinking 01/22/2015"  . Drug use: No     Allergies   Patient has no known allergies.   Review of  Systems Review of Systems  All other systems reviewed and are negative.    Physical Exam Updated Vital Signs BP (!) 207/119   Pulse (!) 115   Temp 98 F (36.7 C) (Oral)   Resp 17   Ht 5\' 6"  (1.676 m)   Wt 64.4 kg (142 lb)   SpO2 93%   BMI 22.92 kg/m   Physical Exam  Constitutional: He is oriented to person, place, and time. He appears well-developed and well-nourished. No distress.  HENT:  Head: Normocephalic and atraumatic.  Mouth/Throat: Oropharynx is clear and moist.  Neck: Normal range of motion. Neck supple.  Cardiovascular: Normal rate and regular rhythm. Exam reveals no friction rub.  No murmur heard. Pulmonary/Chest: Effort normal and breath sounds normal. No respiratory  distress. He has no wheezes. He has no rales.  Abdominal: Soft. Bowel sounds are normal. He exhibits no distension. There is no tenderness.  Musculoskeletal: Normal range of motion. He exhibits no edema.  Neurological: He is alert and oriented to person, place, and time. Coordination normal.  Skin: Skin is warm and dry. He is not diaphoretic.  Nursing note and vitals reviewed.    ED Treatments / Results  Labs (all labs ordered are listed, but only abnormal results are displayed) Labs Reviewed - No data to display  EKG  EKG Interpretation None       Radiology No results found.  Procedures Procedures (including critical care time)  Medications Ordered in ED Medications - No data to display   Initial Impression / Assessment and Plan / ED Course  I have reviewed the triage vital signs and the nursing notes.  Pertinent labs & imaging results that were available during my care of the patient were reviewed by me and considered in my medical decision making (see chart for details).  Patient insists that he is not injured and wants no workup.  He is only requesting refills of his home medications as he has been out of these for the past 2 weeks.  He has had a lapse in primary care and  tells me he has an appointment in March upcoming with a new provider.  Final Clinical Impressions(s) / ED Diagnoses   Final diagnoses:  None    ED Discharge Orders    None       Veryl Speak, MD 11/16/17 [phone]

## 2017-11-16 NOTE — ED Notes (Addendum)
Pt states he is not hurt and in no pain- pt says he needs Rx for insulin and BP medication. Notes from previous PCP indicate pt cannot have any more refills until seen in the office, as he has not been seen there in over one year.

## 2017-11-16 NOTE — ED Triage Notes (Signed)
Pt fell out of bed while asleep and c/o no pain. Pt is out of all meds.

## 2017-11-16 NOTE — Discharge Instructions (Signed)
Resume taking your medications as before.  Follow-up with your primary doctor next month for refills.

## 2017-12-09 MED FILL — LANTUS SOLOSTAR 100 UNITS/M: 100 | 25 days supply | Qty: 6 | Fill #1

## 2017-12-10 ENCOUNTER — Encounter: Payer: Self-pay | Admitting: Internal Medicine

## 2017-12-10 ENCOUNTER — Other Ambulatory Visit: Payer: Self-pay

## 2017-12-10 ENCOUNTER — Ambulatory Visit: Payer: Medicare Other | Attending: Internal Medicine | Admitting: Internal Medicine

## 2017-12-10 VITALS — BP 197/110 | HR 114 | Temp 98.3°F | Resp 16 | Ht 66.0 in | Wt 180.6 lb

## 2017-12-10 DIAGNOSIS — Z955 Presence of coronary angioplasty implant and graft: Secondary | ICD-10-CM | POA: Insufficient documentation

## 2017-12-10 DIAGNOSIS — I2510 Atherosclerotic heart disease of native coronary artery without angina pectoris: Secondary | ICD-10-CM | POA: Diagnosis not present

## 2017-12-10 DIAGNOSIS — E1122 Type 2 diabetes mellitus with diabetic chronic kidney disease: Secondary | ICD-10-CM | POA: Diagnosis not present

## 2017-12-10 DIAGNOSIS — N183 Chronic kidney disease, stage 3 (moderate): Secondary | ICD-10-CM | POA: Insufficient documentation

## 2017-12-10 DIAGNOSIS — I10 Essential (primary) hypertension: Secondary | ICD-10-CM | POA: Diagnosis not present

## 2017-12-10 DIAGNOSIS — Z79899 Other long term (current) drug therapy: Secondary | ICD-10-CM | POA: Insufficient documentation

## 2017-12-10 DIAGNOSIS — Z87891 Personal history of nicotine dependence: Secondary | ICD-10-CM | POA: Diagnosis not present

## 2017-12-10 DIAGNOSIS — I252 Old myocardial infarction: Secondary | ICD-10-CM | POA: Insufficient documentation

## 2017-12-10 DIAGNOSIS — Z794 Long term (current) use of insulin: Secondary | ICD-10-CM | POA: Diagnosis not present

## 2017-12-10 DIAGNOSIS — Z1211 Encounter for screening for malignant neoplasm of colon: Secondary | ICD-10-CM

## 2017-12-10 DIAGNOSIS — Z7982 Long term (current) use of aspirin: Secondary | ICD-10-CM | POA: Insufficient documentation

## 2017-12-10 DIAGNOSIS — Z820 Family history of epilepsy and other diseases of the nervous system: Secondary | ICD-10-CM | POA: Diagnosis not present

## 2017-12-10 DIAGNOSIS — E1165 Type 2 diabetes mellitus with hyperglycemia: Secondary | ICD-10-CM | POA: Diagnosis not present

## 2017-12-10 DIAGNOSIS — D649 Anemia, unspecified: Secondary | ICD-10-CM | POA: Diagnosis not present

## 2017-12-10 DIAGNOSIS — Z8673 Personal history of transient ischemic attack (TIA), and cerebral infarction without residual deficits: Secondary | ICD-10-CM | POA: Insufficient documentation

## 2017-12-10 DIAGNOSIS — R569 Unspecified convulsions: Secondary | ICD-10-CM | POA: Diagnosis not present

## 2017-12-10 DIAGNOSIS — I129 Hypertensive chronic kidney disease with stage 1 through stage 4 chronic kidney disease, or unspecified chronic kidney disease: Secondary | ICD-10-CM | POA: Diagnosis not present

## 2017-12-10 DIAGNOSIS — E11319 Type 2 diabetes mellitus with unspecified diabetic retinopathy without macular edema: Secondary | ICD-10-CM | POA: Diagnosis not present

## 2017-12-10 DIAGNOSIS — R002 Palpitations: Secondary | ICD-10-CM | POA: Diagnosis not present

## 2017-12-10 DIAGNOSIS — Z833 Family history of diabetes mellitus: Secondary | ICD-10-CM | POA: Insufficient documentation

## 2017-12-10 DIAGNOSIS — R269 Unspecified abnormalities of gait and mobility: Secondary | ICD-10-CM | POA: Insufficient documentation

## 2017-12-10 DIAGNOSIS — I480 Paroxysmal atrial fibrillation: Secondary | ICD-10-CM | POA: Diagnosis not present

## 2017-12-10 DIAGNOSIS — Z9889 Other specified postprocedural states: Secondary | ICD-10-CM | POA: Diagnosis not present

## 2017-12-10 LAB — POCT GLYCOSYLATED HEMOGLOBIN (HGB A1C): HEMOGLOBIN A1C: 7.8

## 2017-12-10 LAB — GLUCOSE, POCT (MANUAL RESULT ENTRY): POC GLUCOSE: 142 mg/dL — AB (ref 70–99)

## 2017-12-10 MED ORDER — INSULIN GLARGINE 100 UNIT/ML SOLOSTAR PEN: 24.0000 [IU] | PEN_INJECTOR | Freq: Every day | SUBCUTANEOUS | 3 refills | Status: DC

## 2017-12-10 MED ORDER — ISOSORB DINITRATE-HYDRALAZINE 20-37.5 MG PO TABS: 2.0000 | ORAL_TABLET | Freq: Two times a day (BID) | ORAL | 5 refills | Status: DC

## 2017-12-10 MED ORDER — INSULIN PEN NEEDLE 32G X 4 MM MISC: 5 refills | Status: DC

## 2017-12-10 MED ORDER — LISINOPRIL 10 MG PO TABS: 10.0000 mg | ORAL_TABLET | Freq: Every day | ORAL | 3 refills | Status: DC

## 2017-12-10 MED ORDER — FUROSEMIDE 20 MG PO TABS: 20.0000 mg | ORAL_TABLET | Freq: Every day | ORAL | 3 refills | Status: DC

## 2017-12-10 MED ORDER — ATORVASTATIN CALCIUM 40 MG PO TABS: 40.0000 mg | ORAL_TABLET | Freq: Every day | ORAL | 1 refills | Status: DC

## 2017-12-10 MED ORDER — CARVEDILOL 3.125 MG PO TABS: ORAL_TABLET | ORAL | 3 refills | Status: DC

## 2017-12-10 MED ORDER — ASPIRIN EC 81 MG PO TBEC: 81.0000 mg | DELAYED_RELEASE_TABLET | Freq: Every day | ORAL | 1 refills | Status: DC

## 2017-12-10 MED FILL — FUROSEMIDE 20 MG TABLET: 20 | 30 days supply | Qty: 30 | Fill #0

## 2017-12-10 MED FILL — CARVEDILOL 3.125 MG TABLET: 3.125 | 30 days supply | Qty: 60 | Fill #0

## 2017-12-10 MED FILL — TRUEPLUS PEN NDL 32GX5/32: 32G X 4 MM | 30 days supply | Qty: 100 | Fill #0

## 2017-12-10 MED FILL — ATORVASTATIN 40 MG TABLET: 40 | 90 days supply | Qty: 90 | Fill #0

## 2017-12-10 MED FILL — TRUEPLUS PEN NDL 32GX5/32": 32G X 4 MM | 30 days supply | Qty: 100 | Fill #0

## 2017-12-10 NOTE — Patient Instructions (Addendum)
Check your blood sugars 4 times a week before meals and bring in readings on next visit.  Your blood pressure is not controlled and you have swelling/fluid build up in legs. Cut back on salt in the foods. Restart Carvedilol to help with blood pressure control and to help slow down your heart rate. Take a baby Aspirin daily. Restart the cholesterol medication called Lipitor (Atorvastatin). I have referred you to cardiologist, Dr. Marigene Ehlers.

## 2017-12-10 NOTE — Progress Notes (Signed)
Patient ID: Timothy Lambert, male    DOB: 03-01-1956  MRN: [id number]  CC: re-establish and Diabetes   Subjective: Timothy Lambert is a 62 y.o. male who presents to re-est care.  Last seen 2017 His concerns today include:  Pt with hx of DM, HTN, HL, CAD with DES LAD 0354, embolic CVA, PAF (thought to be a poor candidate for anticoagulation because of history of EtOH abuse at the time of his CVA in 2016 ) ETOH withdrawal sz  1. DM:  Checks BS about 2 x a wk.  Gives range 130-150.  No lows Eating habits:  "I'm trying to stay away from the sweets and starchy foods."  Also avoids pork Exercise: not getting in much exercise. Plans to start when weather breaks. Has a stationary bike at home which he does not use. Due for eye exam.  Plans to schedule with his eye doctor in [place]:  On Lantus 24 units daily.  2.  HTN/CAD/CVA/HL:   Limits salt in foods. -reports palpitations if he lifts heavy object or over exerts in the yard.   -no CP/SOB.  No PND or orthopnea + edema in ankles x 1 wk - on Lisinopril and Bidil.  Taking Bidil BID not TID as prescribed. -taken off Brilinta by cardiologist.  No taking ASA.  HM:  Had tdap within past 10 yrs. never had colon cancer screening.  Moving bowels okay.  No blood in the stools.  Patient Active Problem List   Diagnosis Date Noted  . Diabetic retinopathy ([place]) 11/18/2015  . Paroxysmal atrial fibrillation ([place]) 10/28/2015  . Cognitive deficit, post-stroke 05/13/2015  . Gait disturbance, post-stroke 05/13/2015  . Hypertension 02/25/2015  . Cough 02/25/2015  . Constipation 02/25/2015  . Generalized weakness 02/19/2015  . Essential hypertension 02/19/2015  . CAD (coronary artery disease) 02/19/2015  . Acute renal failure superimposed on stage 3 chronic kidney disease ([place]) 02/19/2015  . CHF (congestive heart failure) ([place]) 02/10/2015  . Hyperkalemia   . 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 Metabolic encephalopathy   . CVA  (cerebral vascular accident) ([place])   . Dysphagia   . ST elevation (STEMI) myocardial infarction involving left anterior descending coronary artery ([place])   . Combined systolic and diastolic congestive heart failure ([place])   . Paroxysmal atrial flutter ([place])   . Hypokalemia   . Diabetes type 2, uncontrolled ([place])   . HLD (hyperlipidemia)   . Altered mental status   . Hyperglycemia   . Seizure ([place])   . Acute embolic stroke ([place])   . Acute respiratory failure with hypoxia ([place]) 01/24/2015  . Acute renal failure syndrome ([place])   . Acute respiratory failure ([place])   . Hypertensive emergency 01/23/2015  . History of ST elevation myocardial infarction (STEMI) 01/23/2015  . Acute MI anterior wall first episode care [facility])   . Altered mental state      Current Outpatient Medications on File Prior to Visit  Medication Sig Dispense Refill  . glucose blood (TRUE METRIX BLOOD GLUCOSE TEST) test strip Use as instructed 100 each 12  . Insulin Syringe-Needle U-100 (INSULIN SYRINGE .5CC/30GX1/2") 30G X 1/2" 0.5 ML MISC Check blood sugar TID & QHS 100 each 2  . TRUEPLUS LANCETS 28G MISC 1 each by Does not apply route 3 (three) times daily before meals. (Patient not taking: Reported on 12/10/2017) 100 each 12   No current facility-administered medications on file prior to visit.     No Known Allergies  Social  History   Socioeconomic History  . Marital status: Married    Spouse name: Not on file  . Number of children: Not on file  . Years of education: Not on file  . Highest education level: Not on file  Occupational History  . Not on file  Social Needs  . Financial resource strain: Not on file  . Food insecurity:    Worry: Not on file    Inability: Not on file  . Transportation needs:    Medical: Not on file    Non-medical: Not on file  Tobacco Use  . Smoking status: Former Smoker    Packs/day: 0.33    Years: 14.00    Pack years: 4.62    Types: Cigarettes  . Smokeless tobacco: Never  Used  . Tobacco comment: "quit smoking cigarettes in 1992"  Substance and Sexual Activity  . Alcohol use: No    Comment: 02/19/2015 "stopped drinking 01/22/2015"  . Drug use: No  . Sexual activity: Not Currently  Lifestyle  . Physical activity:    Days per week: Not on file    Minutes per session: Not on file  . Stress: Not on file  Relationships  . Social connections:    Talks on phone: Not on file    Gets together: Not on file    Attends religious service: Not on file    Active member of club or organization: Not on file    Attends meetings of clubs or organizations: Not on file    Relationship status: Not on file  . Intimate partner violence:    Fear of current or ex partner: Not on file    Emotionally abused: Not on file    Physically abused: Not on file    Forced sexual activity: Not on file  Other Topics Concern  . Not on file  Social History Narrative   Lives   Caffeine use: Coffee sometimes    Family History  Problem Relation Age of Onset  . Diabetes Father   . Seizures Neg Hx     Past Surgical History:  Procedure Laterality Date  . CARDIAC CATHETERIZATION N/A 01/23/2015   Procedure: Left Heart Cath and Coronary Angiography;  Surgeon: [name], MD;  Location: [facility] INVASIVE CV LAB CUPID;  Service: Cardiovascular;  Laterality: N/A;  . CARDIAC CATHETERIZATION N/A 01/23/2015   Procedure: Coronary Stent Intervention;  Surgeon: [name], MD;  Location: [facility] INVASIVE CV LAB CUPID;  Service: Cardiovascular;  Laterality: N/A;    ROS: Review of Systems Negative except as stated above PHYSICAL EXAM: BP (!) 197/110   Pulse (!) 114   Temp 98.3 F (36.8 C) (Oral)   Resp 16   Ht 5\' 6"  (1.676 m)   Wt 180 lb 9.6 oz (81.9 kg)   SpO2 95%   BMI 29.15 kg/m   Wt Readings from Last 3 Encounters:  12/10/17 180 lb 9.6 oz (81.9 kg)  11/16/17 142 lb (64.4 kg)  12/19/15 162 lb (73.5 kg)    Physical Exam  General appearance - alert, well appearing, older  African-American male and in no distress Mental status -patient oriented x3.   Mouth - mucous membranes moist, pharynx normal without lesions Neck - supple, no significant adenopathy Chest - clear to auscultation, no wheezes, rales or rhonchi, symmetric air entry Heart -slightly tachycardic but regular.  No gallops or murmurs.  Mild JVD Abdomen -soft nontender. Extremities -1+ bilateral lower extremity edema from mid shin down  EKG: Sinus tachycardia with some T  inversions in the inferior leads Results for orders placed or performed in visit on 12/10/17  POCT glucose (manual entry)  Result Value Ref Range   POC Glucose 142 (A) 70 - 99 mg/dl  POCT glycosylated hemoglobin (Hb A1C)  Result Value Ref Range   Hemoglobin A1C 7.8     ASSESSMENT AND PLAN: 1. Uncontrolled type 2 diabetes mellitus with hyperglycemia ([place]) -Discussed healthy eating habits.  Encouraged him to walk several days a week for 15-20 minutes as tolerated. Advised to check blood sugars daily and bring in readings on next visit.  I wanted to add metformin but patient states that he did not tolerate that medication in the past - POCT glucose (manual entry) - POCT glycosylated hemoglobin (Hb A1C) - CBC - Comprehensive metabolic panel - Lipid panel - Insulin Glargine (LANTUS SOLOSTAR) 100 UNIT/ML Solostar Pen; Inject 24 Units into the skin daily at 10 pm.  Dispense: 15 mL; Refill: 3 - Insulin Pen Needle (TRUEPLUS PEN NEEDLES) 32G X 4 MM MISC; USE TO INJECT INSULIN AS DIRECTED  Dispense: 100 each; Refill: 5  2. Coronary artery disease involving native coronary artery of native heart without angina pectoris -Restart carvedilol, Lipitor and aspirin.  Since he has been taking BiDil twice a day we will leave him on that dose. -We will get him back in with cardiology whom he has not seen since February 2017.  If he has been clean of alcohol, case could be made for putting him on 1 of the DOAGs - atorvastatin (LIPITOR) 40 MG  tablet; Take 1 tablet (40 mg total) by mouth daily at 6 PM.  Dispense: 90 tablet; Refill: 1 - isosorbide-hydrALAZINE (BIDIL) 20-37.5 MG tablet; Take 2 tablets by mouth 2 (two) times daily.  Dispense: 120 tablet; Refill: 5 - Brain natriuretic peptide - aspirin EC 81 MG tablet; Take 1 tablet (81 mg total) by mouth daily.  Dispense: 100 tablet; Refill: 1 - Ambulatory referral to Cardiology  3. Essential hypertension -Uncontrolled.  Continue lisinopril and BiDil.  Restart carvedilol.  Add low-dose furosemide - lisinopril (PRINIVIL,ZESTRIL) 10 MG tablet; Take 1 tablet (10 mg total) by mouth daily.  Dispense: 30 tablet; Refill: 3 - carvedilol (COREG) 3.125 MG tablet; TAKE 1 TABLET BY MOUTH TWICE DAILY WITH A MEAL  Dispense: 60 tablet; Refill: 3 - furosemide (LASIX) 20 MG tablet; Take 1 tablet (20 mg total) by mouth daily.  Dispense: 30 tablet; Refill: 3  4. Palpitations - EKG 12-Lead - TSH  5. Colon cancer screening - Fecal occult blood, imunochemical([facility])  Patient was given the opportunity to ask questions.  Patient verbalized understanding of the plan and was able to repeat key elements of the plan.   Orders Placed This Encounter  Procedures  . Fecal occult blood, imunochemical([facility])  . CBC  . Comprehensive metabolic panel  . Lipid panel  . TSH  . Brain natriuretic peptide  . Ambulatory referral to Cardiology  . POCT glucose (manual entry)  . POCT glycosylated hemoglobin (Hb A1C)  . EKG 12-Lead     Requested Prescriptions   Signed Prescriptions Disp Refills  . Insulin Glargine (LANTUS SOLOSTAR) 100 UNIT/ML Solostar Pen 15 mL 3    Sig: Inject 24 Units into the skin daily at 10 pm.  . Insulin Pen Needle (TRUEPLUS PEN NEEDLES) 32G X 4 MM MISC 100 each 5    Sig: USE TO INJECT INSULIN AS DIRECTED  . atorvastatin (LIPITOR) 40 MG tablet 90 tablet 1    Sig: Take 1 tablet (40  mg total) by mouth daily at 6 PM.  . isosorbide-hydrALAZINE (BIDIL) 20-37.5 MG tablet  120 tablet 5    Sig: Take 2 tablets by mouth 2 (two) times daily.  Marland Kitchen lisinopril (PRINIVIL,ZESTRIL) 10 MG tablet 30 tablet 3    Sig: Take 1 tablet (10 mg total) by mouth daily.  Marland Kitchen aspirin EC 81 MG tablet 100 tablet 1    Sig: Take 1 tablet (81 mg total) by mouth daily.  . carvedilol (COREG) 3.125 MG tablet 60 tablet 3    Sig: TAKE 1 TABLET BY MOUTH TWICE DAILY WITH A MEAL  . furosemide (LASIX) 20 MG tablet 30 tablet 3    Sig: Take 1 tablet (20 mg total) by mouth daily.    Return in about 1 month (around 01/10/2018).  Karle Plumber, MD, FACP

## 2017-12-11 LAB — CBC
HEMOGLOBIN: 12.3 g/dL — AB (ref 13.0–17.7)
Hematocrit: 35.4 % — ABNORMAL LOW (ref 37.5–51.0)
MCH: 29.6 pg (ref 26.6–33.0)
MCHC: 34.7 g/dL (ref 31.5–35.7)
MCV: 85 fL (ref 79–97)
PLATELETS: 245 10*3/uL (ref 150–379)
RBC: 4.15 x10E6/uL (ref 4.14–5.80)
RDW: 15 % (ref 12.3–15.4)
WBC: 6.7 10*3/uL (ref 3.4–10.8)

## 2017-12-11 LAB — LIPID PANEL
CHOLESTEROL TOTAL: 179 mg/dL (ref 100–199)
Chol/HDL Ratio: 4.6 ratio (ref 0.0–5.0)
HDL: 39 mg/dL — AB (ref >39)
LDL CALC: 126 mg/dL — AB (ref 0–99)
TRIGLYCERIDES: 71 mg/dL (ref 0–149)
VLDL CHOLESTEROL CAL: 14 mg/dL (ref 5–40)

## 2017-12-11 LAB — COMPREHENSIVE METABOLIC PANEL
ALT: 22 IU/L (ref 0–44)
AST: 18 IU/L (ref 0–40)
Albumin/Globulin Ratio: 1.9 (ref 1.2–2.2)
Albumin: 4 g/dL (ref 3.6–4.8)
Alkaline Phosphatase: 68 IU/L (ref 39–117)
BUN/Creatinine Ratio: 12 (ref 10–24)
BUN: 22 mg/dL (ref 8–27)
Bilirubin Total: 0.2 mg/dL (ref 0.0–1.2)
CO2: 22 mmol/L (ref 20–29)
CREATININE: 1.82 mg/dL — AB (ref 0.76–1.27)
Calcium: 8.5 mg/dL — ABNORMAL LOW (ref 8.6–10.2)
Chloride: 107 mmol/L — ABNORMAL HIGH (ref 96–106)
GFR calc Af Amer: 45 mL/min/{1.73_m2} — ABNORMAL LOW (ref >59)
GFR calc non Af Amer: 39 mL/min/{1.73_m2} — ABNORMAL LOW (ref >59)
GLUCOSE: 111 mg/dL — AB (ref 65–99)
Globulin, Total: 2.1 g/dL (ref 1.5–4.5)
Potassium: 4 mmol/L (ref 3.5–5.2)
Sodium: 144 mmol/L (ref 134–144)
Total Protein: 6.1 g/dL (ref 6.0–8.5)

## 2017-12-11 LAB — BRAIN NATRIURETIC PEPTIDE: BNP: 672.6 pg/mL — ABNORMAL HIGH (ref 0.0–100.0)

## 2017-12-11 LAB — TSH: TSH: 2.99 u[IU]/mL (ref 0.450–4.500)

## 2017-12-11 NOTE — Progress Notes (Signed)
Results for orders placed or performed in visit on 12/10/17  CBC  Result Value Ref Range   WBC 6.7 3.4 - 10.8 x10E3/uL   RBC 4.15 4.14 - 5.80 x10E6/uL   Hemoglobin 12.3 (L) 13.0 - 17.7 g/dL   Hematocrit 35.4 (L) 37.5 - 51.0 %   MCV 85 79 - 97 fL   MCH 29.6 26.6 - 33.0 pg   MCHC 34.7 31.5 - 35.7 g/dL   RDW 15.0 12.3 - 15.4 %   Platelets 245 150 - 379 x10E3/uL  Comprehensive metabolic panel  Result Value Ref Range   Glucose 111 (H) 65 - 99 mg/dL   BUN 22 8 - 27 mg/dL   Creatinine, Ser 1.82 (H) 0.76 - 1.27 mg/dL   GFR calc non Af Amer 39 (L) >59 mL/min/1.73   GFR calc Af Amer 45 (L) >59 mL/min/1.73   BUN/Creatinine Ratio 12 10 - 24   Sodium 144 134 - 144 mmol/L   Potassium 4.0 3.5 - 5.2 mmol/L   Chloride 107 (H) 96 - 106 mmol/L   CO2 22 20 - 29 mmol/L   Calcium 8.5 (L) 8.6 - 10.2 mg/dL   Total Protein 6.1 6.0 - 8.5 g/dL   Albumin 4.0 3.6 - 4.8 g/dL   Globulin, Total 2.1 1.5 - 4.5 g/dL   Albumin/Globulin Ratio 1.9 1.2 - 2.2   Bilirubin Total 0.2 0.0 - 1.2 mg/dL   Alkaline Phosphatase 68 39 - 117 IU/L   AST 18 0 - 40 IU/L   ALT 22 0 - 44 IU/L  Lipid panel  Result Value Ref Range   Cholesterol, Total 179 100 - 199 mg/dL   Triglycerides 71 0 - 149 mg/dL   HDL 39 (L) >39 mg/dL   VLDL Cholesterol Cal 14 5 - 40 mg/dL   LDL Calculated 126 (H) 0 - 99 mg/dL   Chol/HDL Ratio 4.6 0.0 - 5.0 ratio  TSH  Result Value Ref Range   TSH 2.990 0.450 - 4.500 uIU/mL  Brain natriuretic peptide  Result Value Ref Range   BNP WILL FOLLOW   POCT glucose (manual entry)  Result Value Ref Range   POC Glucose 142 (A) 70 - 99 mg/dl  POCT glycosylated hemoglobin (Hb A1C)  Result Value Ref Range   Hemoglobin A1C 7.8    Will add iron studies, B12 and Folate level due to anemia.

## 2017-12-11 NOTE — Addendum Note (Signed)
Addended by: Karle Plumber B on: 12/11/2017 11:15 AM   Modules accepted: Orders

## 2017-12-13 ENCOUNTER — Telehealth: Payer: Self-pay

## 2017-12-13 NOTE — Telephone Encounter (Signed)
Contacted pt to go over lab results pt didn't answer lvm asking pt to give me a call at his earliest convenience  

## 2017-12-14 LAB — IRON,TIBC AND FERRITIN PANEL
FERRITIN: 122 ng/mL (ref 30–400)
IRON SATURATION: 15 % (ref 15–55)
Iron: 37 ug/dL — ABNORMAL LOW (ref 38–169)
Total Iron Binding Capacity: 252 ug/dL (ref 250–450)
UIBC: 215 ug/dL (ref 111–343)

## 2017-12-14 LAB — VITAMIN B12: VITAMIN B 12: 495 pg/mL (ref 232–1245)

## 2017-12-14 LAB — FOLATE: Folate: 6.6 ng/mL (ref >3.0)

## 2017-12-14 LAB — SPECIMEN STATUS REPORT

## 2017-12-20 MED FILL — LISINOPRIL 10 MG TABS: 10 | 90 days supply | Qty: 90 | Fill #1

## 2017-12-30 MED FILL — LANTUS SOLOSTAR 100 UNITS/M: 100 | 12 days supply | Qty: 3 | Fill #2

## 2018-01-05 MED FILL — BIDIL TABLET: 20-37.5 | 30 days supply | Qty: 120 | Fill #0

## 2018-01-11 ENCOUNTER — Encounter: Payer: Self-pay | Admitting: Internal Medicine

## 2018-01-11 ENCOUNTER — Ambulatory Visit: Payer: Medicare Other | Attending: Internal Medicine | Admitting: Internal Medicine

## 2018-01-11 VITALS — BP 191/107 | HR 95 | Temp 98.1°F | Resp 16 | Wt 172.2 lb

## 2018-01-11 DIAGNOSIS — Z8673 Personal history of transient ischemic attack (TIA), and cerebral infarction without residual deficits: Secondary | ICD-10-CM | POA: Diagnosis not present

## 2018-01-11 DIAGNOSIS — N183 Chronic kidney disease, stage 3 unspecified: Secondary | ICD-10-CM

## 2018-01-11 DIAGNOSIS — Z7982 Long term (current) use of aspirin: Secondary | ICD-10-CM | POA: Insufficient documentation

## 2018-01-11 DIAGNOSIS — E1165 Type 2 diabetes mellitus with hyperglycemia: Secondary | ICD-10-CM

## 2018-01-11 DIAGNOSIS — I480 Paroxysmal atrial fibrillation: Secondary | ICD-10-CM | POA: Insufficient documentation

## 2018-01-11 DIAGNOSIS — IMO0002 Reserved for concepts with insufficient information to code with codable children: Secondary | ICD-10-CM

## 2018-01-11 DIAGNOSIS — Z87891 Personal history of nicotine dependence: Secondary | ICD-10-CM | POA: Insufficient documentation

## 2018-01-11 DIAGNOSIS — E1122 Type 2 diabetes mellitus with diabetic chronic kidney disease: Secondary | ICD-10-CM | POA: Diagnosis not present

## 2018-01-11 DIAGNOSIS — I10 Essential (primary) hypertension: Secondary | ICD-10-CM | POA: Diagnosis not present

## 2018-01-11 DIAGNOSIS — I2510 Atherosclerotic heart disease of native coronary artery without angina pectoris: Secondary | ICD-10-CM | POA: Insufficient documentation

## 2018-01-11 DIAGNOSIS — E118 Type 2 diabetes mellitus with unspecified complications: Secondary | ICD-10-CM

## 2018-01-11 DIAGNOSIS — F10239 Alcohol dependence with withdrawal, unspecified: Secondary | ICD-10-CM | POA: Insufficient documentation

## 2018-01-11 DIAGNOSIS — I129 Hypertensive chronic kidney disease with stage 1 through stage 4 chronic kidney disease, or unspecified chronic kidney disease: Secondary | ICD-10-CM | POA: Diagnosis not present

## 2018-01-11 DIAGNOSIS — D638 Anemia in other chronic diseases classified elsewhere: Secondary | ICD-10-CM | POA: Insufficient documentation

## 2018-01-11 DIAGNOSIS — Z79899 Other long term (current) drug therapy: Secondary | ICD-10-CM | POA: Diagnosis not present

## 2018-01-11 DIAGNOSIS — Z794 Long term (current) use of insulin: Secondary | ICD-10-CM | POA: Insufficient documentation

## 2018-01-11 LAB — GLUCOSE, POCT (MANUAL RESULT ENTRY): POC Glucose: 109 mg/dl — AB (ref 70–99)

## 2018-01-11 MED ORDER — INSULIN GLARGINE 100 UNIT/ML SOLOSTAR PEN: 26.0000 [IU] | PEN_INJECTOR | Freq: Every day | SUBCUTANEOUS | 3 refills | Status: DC

## 2018-01-11 MED ORDER — CARVEDILOL 3.125 MG PO TABS: ORAL_TABLET | ORAL | 3 refills | Status: DC

## 2018-01-11 MED FILL — CARVEDILOL 3.125 MG TABLET: 3.125 | 30 days supply | Qty: 60 | Fill #0

## 2018-01-11 MED FILL — LANTUS SOLOSTAR 100 UNITS/M: 100 | 30 days supply | Qty: 9 | Fill #0

## 2018-01-11 NOTE — Patient Instructions (Signed)
Your blood pressure is not controlled.  Goal is to be 130/80 or lower.  Please start the carvedilol twice a day as discussed today.  You can pick up this prescription from our pharmacy.  Try to limit salt in the foods.  Do remember to schedule your ER eye exam with your eye doctor in [place].  Your kidney function is not 100%.  This may be due to uncontrolled diabetes and blood pressure.  It is imperative that we work to get both under control.  Please avoid taking any over-the-counter pain medications as these can make your kidney function worse.  Your calcium level is low which may be due to low vitamin D.  Please purchase vitamin D 400 IU over-the-counter and take 1 tablet daily.  Please remember to mail in your Cologuard test for colon cancer screening.

## 2018-01-11 NOTE — Progress Notes (Signed)
Patient ID: Timothy Lambert, male    DOB: 30-Jul-1956  MRN: [id number]  CC: Follow-up   Subjective: Timothy Lambert is a 62 y.o. male who presents for 1 mth f/u.  His concerns today include:  Pt with hx of DM, HTN, HL, CAD with DES LAD 3664, embolic CVA, PAF (thought to be a poor candidate for anticoagulation because of history of EtOH abuse at the time of his CVA in 2016 ) ETOH withdrawal sz  1.  HTN/CAD:  Did not get the Coreg.  Though everything was okay based on EKG on last visit -reports compliance with Bidil, Lisinopril  2.  DM: check BS daily in a.m.  Bring log for a.m BS: 148-160. -did not schedule eye exam as yet with doctor in [place].  Depends on wife to take him -he has not started using his exercise bike as yet  I went over the lab results from blood work done on last visit.  His kidney function had significantly declined compared to 1-2 years ago.  His calcium was low likely due to vitamin D deficiency.  His LDL was not at goal.  Patient Active Problem List   Diagnosis Date Noted  . Diabetic retinopathy ([place]) 11/18/2015  . Paroxysmal atrial fibrillation ([place]) 10/28/2015  . Cognitive deficit, post-stroke 05/13/2015  . Essential hypertension 02/19/2015  . CAD (coronary artery disease) 02/19/2015  . Acute renal failure superimposed on stage 3 chronic kidney disease ([place]) 02/19/2015  . 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 CVA (cerebral vascular accident) ([place])   . Combined systolic and diastolic congestive heart failure ([place])   . Paroxysmal atrial flutter ([place])   . Diabetes type 2, uncontrolled ([place])   . Hyperglycemia   . Seizure ([place])   . Altered mental state      Current Outpatient Medications on File Prior to Visit  Medication Sig Dispense Refill  . aspirin EC 81 MG tablet Take 1 tablet (81 mg total) by mouth daily. 100 tablet 1  . atorvastatin (LIPITOR) 40 MG tablet Take 1 tablet (40 mg total) by mouth daily at 6 PM. 90 tablet 1  . furosemide  (LASIX) 20 MG tablet Take 1 tablet (20 mg total) by mouth daily. 30 tablet 3  . glucose blood (TRUE METRIX BLOOD GLUCOSE TEST) test strip Use as instructed 100 each 12  . Insulin Pen Needle (TRUEPLUS PEN NEEDLES) 32G X 4 MM MISC USE TO INJECT INSULIN AS DIRECTED 100 each 5  . Insulin Syringe-Needle U-100 (INSULIN SYRINGE .5CC/30GX1/2") 30G X 1/2" 0.5 ML MISC Check blood sugar TID & QHS 100 each 2  . isosorbide-hydrALAZINE (BIDIL) 20-37.5 MG tablet Take 2 tablets by mouth 2 (two) times daily. 120 tablet 5  . lisinopril (PRINIVIL,ZESTRIL) 10 MG tablet Take 1 tablet (10 mg total) by mouth daily. 30 tablet 3  . TRUEPLUS LANCETS 28G MISC 1 each by Does not apply route 3 (three) times daily before meals. (Patient not taking: Reported on 12/10/2017) 100 each 12   No current facility-administered medications on file prior to visit.     No Known Allergies  Social History   Socioeconomic History  . Marital status: Married    Spouse name: Not on file  . Number of children: Not on file  . Years of education: Not on file  . Highest education level: Not on file  Occupational History  . Not on file  Social Needs  . Financial resource strain: Not on file  . Food insecurity:  Worry: Not on file    Inability: Not on file  . Transportation needs:    Medical: Not on file    Non-medical: Not on file  Tobacco Use  . Smoking status: Former Smoker    Packs/day: 0.33    Years: 14.00    Pack years: 4.62    Types: Cigarettes  . Smokeless tobacco: Never Used  . Tobacco comment: "quit smoking cigarettes in 1992"  Substance and Sexual Activity  . Alcohol use: No    Comment: 02/19/2015 "stopped drinking 01/22/2015"  . Drug use: No  . Sexual activity: Not Currently  Lifestyle  . Physical activity:    Days per week: Not on file    Minutes per session: Not on file  . Stress: Not on file  Relationships  . Social connections:    Talks on phone: Not on file    Gets together: Not on file    Attends  religious service: Not on file    Active member of club or organization: Not on file    Attends meetings of clubs or organizations: Not on file    Relationship status: Not on file  . Intimate partner violence:    Fear of current or ex partner: Not on file    Emotionally abused: Not on file    Physically abused: Not on file    Forced sexual activity: Not on file  Other Topics Concern  . Not on file  Social History Narrative   Lives   Caffeine use: Coffee sometimes    Family History  Problem Relation Age of Onset  . Diabetes Father   . Seizures Neg Hx     Past Surgical History:  Procedure Laterality Date  . CARDIAC CATHETERIZATION N/A 01/23/2015   Procedure: Left Heart Cath and Coronary Angiography;  Surgeon: [name], MD;  Location: [facility] INVASIVE CV LAB CUPID;  Service: Cardiovascular;  Laterality: N/A;  . CARDIAC CATHETERIZATION N/A 01/23/2015   Procedure: Coronary Stent Intervention;  Surgeon: [name], MD;  Location: [facility] INVASIVE CV LAB CUPID;  Service: Cardiovascular;  Laterality: N/A;    ROS: Review of Systems  PHYSICAL EXAM: BP (!) 191/107   Pulse 95   Temp 98.1 F (36.7 C) (Oral)   Resp 16   Wt 172 lb 3.2 oz (78.1 kg)   SpO2 97%   BMI 27.79 kg/m   Physical Exam  General appearance - alert, well appearing, and in no distress Mental status - alert, oriented to person, place, and time, normal mood, behavior, speech, dress, motor activity, and thought processes Chest - clear to auscultation, no wheezes, rales or rhonchi, symmetric air entry Heart - normal rate, regular rhythm, normal S1, S2, no murmurs, rubs, clicks or gallops  Results for orders placed or performed in visit on 01/11/18  POCT glucose (manual entry)  Result Value Ref Range   POC Glucose 109 (A) 70 - 99 mg/dl    ASSESSMENT AND PLAN: 1. Uncontrolled diabetes mellitus with complication, with long-term current use of insulin ([place]) Thanked him for bringing his blood sugar log with him today.   Morning blood sugars are slightly above goal.  Increase Lantus from 24 units daily to 26 units.  Continue to monitor blood sugars.  Encourage him to start walking or using his stationary bike for exercise - POCT glucose (manual entry) - Insulin Glargine (LANTUS SOLOSTAR) 100 UNIT/ML Solostar Pen; Inject 26 Units into the skin daily at 10 pm.  Dispense: 15 mL; Refill: 3 - Microalbumin / creatinine  urine ratio  2. Essential hypertension Not at goal. Advised to start carvedilol as was recommended on last visit.  Follow-up in 1 month for blood pressure recheck  3. Anemia, chronic disease Anemia on recent blood work had improved compared to previous.  Iron studies consistent with ACD  4. CKD (chronic kidney disease) stage 3, GFR 30-59 ml/min (HCC) Stressed the importance of good blood pressure control and diabetes control to prevent progression.  Avoid NSAIDs.  5. Hypocalcemia Advised to purchase vitamin D 400 IU and take 1 tablet daily   Patient was given the opportunity to ask questions.  Patient verbalized understanding of the plan and was able to repeat key elements of the plan.   Orders Placed This Encounter  Procedures  . Microalbumin / creatinine urine ratio  . POCT glucose (manual entry)     Requested Prescriptions   Signed Prescriptions Disp Refills  . Insulin Glargine (LANTUS SOLOSTAR) 100 UNIT/ML Solostar Pen 15 mL 3    Sig: Inject 26 Units into the skin daily at 10 pm.  . carvedilol (COREG) 3.125 MG tablet 60 tablet 3    Sig: TAKE 1 TABLET BY MOUTH TWICE DAILY WITH A MEAL    Return in about 1 month (around 02/10/2018).  Karle Plumber, MD, FACP

## 2018-01-12 ENCOUNTER — Other Ambulatory Visit: Payer: Self-pay | Admitting: Internal Medicine

## 2018-01-12 ENCOUNTER — Telehealth: Payer: Self-pay

## 2018-01-12 DIAGNOSIS — E1121 Type 2 diabetes mellitus with diabetic nephropathy: Secondary | ICD-10-CM

## 2018-01-12 DIAGNOSIS — N183 Chronic kidney disease, stage 3 unspecified: Secondary | ICD-10-CM

## 2018-01-12 LAB — MICROALBUMIN / CREATININE URINE RATIO
Creatinine, Urine: 82.4 mg/dL
Microalb/Creat Ratio: 1541.9 mg/g creat — ABNORMAL HIGH (ref 0.0–30.0)
Microalbumin, Urine: 1270.5 ug/mL

## 2018-01-12 NOTE — Telephone Encounter (Signed)
Contacted pt to go over micro urine results pt is aware and doesn't have any questions or concerns

## 2018-02-10 MED FILL — BIDIL TABLET: 20-37.5 | 30 days supply | Qty: 120 | Fill #1

## 2018-02-10 MED FILL — FUROSEMIDE 20 MG TABLET: 20 | 30 days supply | Qty: 30 | Fill #1

## 2018-02-15 ENCOUNTER — Encounter: Payer: Self-pay | Admitting: Internal Medicine

## 2018-02-15 ENCOUNTER — Ambulatory Visit: Payer: Medicare Other | Attending: Internal Medicine | Admitting: Internal Medicine

## 2018-02-15 VITALS — BP 190/105 | HR 94 | Temp 98.3°F | Resp 16 | Wt 172.2 lb

## 2018-02-15 DIAGNOSIS — Z833 Family history of diabetes mellitus: Secondary | ICD-10-CM | POA: Diagnosis not present

## 2018-02-15 DIAGNOSIS — E118 Type 2 diabetes mellitus with unspecified complications: Secondary | ICD-10-CM | POA: Diagnosis not present

## 2018-02-15 DIAGNOSIS — Z8673 Personal history of transient ischemic attack (TIA), and cerebral infarction without residual deficits: Secondary | ICD-10-CM | POA: Insufficient documentation

## 2018-02-15 DIAGNOSIS — E1121 Type 2 diabetes mellitus with diabetic nephropathy: Secondary | ICD-10-CM | POA: Diagnosis not present

## 2018-02-15 DIAGNOSIS — I129 Hypertensive chronic kidney disease with stage 1 through stage 4 chronic kidney disease, or unspecified chronic kidney disease: Secondary | ICD-10-CM | POA: Insufficient documentation

## 2018-02-15 DIAGNOSIS — Z87891 Personal history of nicotine dependence: Secondary | ICD-10-CM | POA: Insufficient documentation

## 2018-02-15 DIAGNOSIS — E1165 Type 2 diabetes mellitus with hyperglycemia: Secondary | ICD-10-CM

## 2018-02-15 DIAGNOSIS — N179 Acute kidney failure, unspecified: Secondary | ICD-10-CM | POA: Insufficient documentation

## 2018-02-15 DIAGNOSIS — I10 Essential (primary) hypertension: Secondary | ICD-10-CM

## 2018-02-15 DIAGNOSIS — N183 Chronic kidney disease, stage 3 unspecified: Secondary | ICD-10-CM

## 2018-02-15 DIAGNOSIS — Z79899 Other long term (current) drug therapy: Secondary | ICD-10-CM | POA: Insufficient documentation

## 2018-02-15 DIAGNOSIS — E1122 Type 2 diabetes mellitus with diabetic chronic kidney disease: Secondary | ICD-10-CM | POA: Diagnosis not present

## 2018-02-15 DIAGNOSIS — E11319 Type 2 diabetes mellitus with unspecified diabetic retinopathy without macular edema: Secondary | ICD-10-CM | POA: Insufficient documentation

## 2018-02-15 DIAGNOSIS — Z7982 Long term (current) use of aspirin: Secondary | ICD-10-CM | POA: Diagnosis not present

## 2018-02-15 DIAGNOSIS — Z955 Presence of coronary angioplasty implant and graft: Secondary | ICD-10-CM | POA: Insufficient documentation

## 2018-02-15 DIAGNOSIS — I480 Paroxysmal atrial fibrillation: Secondary | ICD-10-CM | POA: Insufficient documentation

## 2018-02-15 DIAGNOSIS — IMO0002 Reserved for concepts with insufficient information to code with codable children: Secondary | ICD-10-CM

## 2018-02-15 DIAGNOSIS — I4892 Unspecified atrial flutter: Secondary | ICD-10-CM | POA: Insufficient documentation

## 2018-02-15 DIAGNOSIS — I2510 Atherosclerotic heart disease of native coronary artery without angina pectoris: Secondary | ICD-10-CM | POA: Diagnosis not present

## 2018-02-15 DIAGNOSIS — Z794 Long term (current) use of insulin: Secondary | ICD-10-CM | POA: Insufficient documentation

## 2018-02-15 DIAGNOSIS — E119 Type 2 diabetes mellitus without complications: Secondary | ICD-10-CM | POA: Diagnosis present

## 2018-02-15 LAB — POCT GLYCOSYLATED HEMOGLOBIN (HGB A1C): HBA1C, POC (CONTROLLED DIABETIC RANGE): 7.8 % — AB (ref 0.0–7.0)

## 2018-02-15 LAB — GLUCOSE, POCT (MANUAL RESULT ENTRY): POC Glucose: 92 mg/dl (ref 70–99)

## 2018-02-15 MED ORDER — ACCU-CHEK SOFT TOUCH LANCETS MISC: 12 refills | Status: DC

## 2018-02-15 MED ORDER — CARVEDILOL 6.25 MG PO TABS: ORAL_TABLET | ORAL | 6 refills | Status: DC

## 2018-02-15 MED ORDER — GLUCOSE BLOOD VI STRP: ORAL_STRIP | 12 refills | Status: DC

## 2018-02-15 MED ORDER — TRUEPLUS LANCETS 28G MISC: 1.0000 | Freq: Three times a day (TID) | 12 refills | Status: DC

## 2018-02-15 MED ORDER — TRUE METRIX METER W/DEVICE KIT: PACK | 0 refills | Status: DC

## 2018-02-15 MED ORDER — CLONIDINE HCL 0.1 MG PO TABS
0.1000 mg | ORAL_TABLET | Freq: Once | ORAL | Status: AC
  Administered 2018-02-15: 0.1 mg via ORAL

## 2018-02-15 MED FILL — LANTUS SOLOSTAR 100 UNITS/M: 100 | 30 days supply | Qty: 9 | Fill #1

## 2018-02-15 MED FILL — ACCU-CHEK AVIVA PLUS METER: W/DEVICE | 30 days supply | Qty: 1 | Fill #0

## 2018-02-15 MED FILL — CARVEDILOL 6.25 MG TABLET: 6.25 | 30 days supply | Qty: 60 | Fill #0

## 2018-02-15 MED FILL — ACCU-CHEK AVIVA PLUS TEST S: 30 days supply | Qty: 100 | Fill #0

## 2018-02-15 MED FILL — ACCU-CHEK SOFTCLIX LANCETS: 30 days supply | Qty: 100 | Fill #0

## 2018-02-15 NOTE — Patient Instructions (Addendum)
Please give appointment with clinical pharmacist in 1 week for repeat blood pressure check.    Increase Carvedilol to 6.25 mg twice a day.

## 2018-02-15 NOTE — Progress Notes (Signed)
Patient ID: Timothy Lambert, male    DOB: June 19, 1956  MRN: [id number]  CC: Diabetes and Hypertension   Subjective: Timothy Lambert is a 62 y.o. male who presents for f/u HTN.  Last seen 12/2017 His concerns today include:  Pt with hx of DM, HTN, HL, CAD with DES LAD 2016,embolicCVA, PAF (thought to be a poor candidate for anticoagulation because of history of EtOH abuse at the time of his CVA in 2016) ETOH withdrawal sz, CKD stage 3   HTN:  Reports compliance with meds - Bidil, Coreg and Lisinopril. On further questioning, he has been taking Coreg only once a day.  Did not know it was BID dosing -he limits salt in foods Denies any CP/SOB/PND/dizziness/blurred vision a this time  DM: meter not working.  Needs new one Eating habits:  "I eat sweets QOD."  Likes oatmeal cookies Had large amount of protein in urine 1.5 gram.  Referred to nephrology.  No appt as yet  Hypocalcemia: did not purchase Vit D as yet  Patient Active Problem List   Diagnosis Date Noted  . Diabetic retinopathy ([place]) 11/18/2015  . Paroxysmal atrial fibrillation ([place]) 10/28/2015  . Cognitive deficit, post-stroke 05/13/2015  . Essential hypertension 02/19/2015  . CAD (coronary artery disease) 02/19/2015  . Acute renal failure superimposed on stage 3 chronic kidney disease ([place]) 02/19/2015  . 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 CVA (cerebral vascular accident) ([place])   . Combined systolic and diastolic congestive heart failure ([place])   . Paroxysmal atrial flutter ([place])   . Diabetes type 2, uncontrolled ([place])   . Hyperglycemia   . Seizure ([place])   . Altered mental state      Current Outpatient Medications on File Prior to Visit  Medication Sig Dispense Refill  . aspirin EC 81 MG tablet Take 1 tablet (81 mg total) by mouth daily. 100 tablet 1  . atorvastatin (LIPITOR) 40 MG tablet Take 1 tablet (40 mg total) by mouth daily at 6 PM. 90 tablet 1  . furosemide (LASIX) 20 MG tablet Take 1 tablet (20  mg total) by mouth daily. 30 tablet 3  . Insulin Glargine (LANTUS SOLOSTAR) 100 UNIT/ML Solostar Pen Inject 26 Units into the skin daily at 10 pm. 15 mL 3  . Insulin Pen Needle (TRUEPLUS PEN NEEDLES) 32G X 4 MM MISC USE TO INJECT INSULIN AS DIRECTED 100 each 5  . Insulin Syringe-Needle U-100 (INSULIN SYRINGE .5CC/30GX1/2") 30G X 1/2" 0.5 ML MISC Check blood sugar TID & QHS 100 each 2  . isosorbide-hydrALAZINE (BIDIL) 20-37.5 MG tablet Take 2 tablets by mouth 2 (two) times daily. 120 tablet 5  . lisinopril (PRINIVIL,ZESTRIL) 10 MG tablet Take 1 tablet (10 mg total) by mouth daily. 30 tablet 3   No current facility-administered medications on file prior to visit.     No Known Allergies  Social History   Socioeconomic History  . Marital status: Married    Spouse name: Not on file  . Number of children: Not on file  . Years of education: Not on file  . Highest education level: Not on file  Occupational History  . Not on file  Social Needs  . Financial resource strain: Not on file  . Food insecurity:    Worry: Not on file    Inability: Not on file  . Transportation needs:    Medical: Not on file    Non-medical: Not on file  Tobacco Use  . Smoking status: Former Smoker  Packs/day: 0.33    Years: 14.00    Pack years: 4.62    Types: Cigarettes  . Smokeless tobacco: Never Used  . Tobacco comment: "quit smoking cigarettes in 1992"  Substance and Sexual Activity  . Alcohol use: No    Comment: 02/19/2015 "stopped drinking 01/22/2015"  . Drug use: No  . Sexual activity: Not Currently  Lifestyle  . Physical activity:    Days per week: Not on file    Minutes per session: Not on file  . Stress: Not on file  Relationships  . Social connections:    Talks on phone: Not on file    Gets together: Not on file    Attends religious service: Not on file    Active member of club or organization: Not on file    Attends meetings of clubs or organizations: Not on file    Relationship status:  Not on file  . Intimate partner violence:    Fear of current or ex partner: Not on file    Emotionally abused: Not on file    Physically abused: Not on file    Forced sexual activity: Not on file  Other Topics Concern  . Not on file  Social History Narrative   Lives   Caffeine use: Coffee sometimes    Family History  Problem Relation Age of Onset  . Diabetes Father   . Seizures Neg Hx     Past Surgical History:  Procedure Laterality Date  . CARDIAC CATHETERIZATION N/A 01/23/2015   Procedure: Left Heart Cath and Coronary Angiography;  Surgeon: [name], MD;  Location: [facility] INVASIVE CV LAB CUPID;  Service: Cardiovascular;  Laterality: N/A;  . CARDIAC CATHETERIZATION N/A 01/23/2015   Procedure: Coronary Stent Intervention;  Surgeon: [name], MD;  Location: [facility] INVASIVE CV LAB CUPID;  Service: Cardiovascular;  Laterality: N/A;    ROS: Review of Systems  PHYSICAL EXAM: BP (!) 190/105   Pulse 94   Temp 98.3 F (36.8 C) (Oral)   Resp 16   Wt 172 lb 3.2 oz (78.1 kg)   SpO2 99%   BMI 27.79 kg/m   Initial BP 209/121 Physical Exam General appearance - alert, well appearing, and in no distress Mental status - alert, oriented to person, place, and time, normal mood, behavior, speech, dress, motor activity, and thought processes Neck - supple, no significant adenopathy Chest - clear to auscultation, no wheezes, rales or rhonchi, symmetric air entry Heart - normal rate, regular rhythm, normal S1, S2, no murmurs, rubs, clicks or gallops Extremities - trace LE edema  Results for orders placed or performed in visit on 02/15/18  POCT glucose (manual entry)  Result Value Ref Range   POC Glucose 92 70 - 99 mg/dl  POCT glycosylated hemoglobin (Hb A1C)  Result Value Ref Range   Hemoglobin A1C  4.0 - 5.6 %   HbA1c, POC (prediabetic range)  5.7 - 6.4 %   HbA1c, POC (controlled diabetic range) 7.8 (A) 0.0 - 7.0 %    ASSESSMENT AND PLAN: 1. Essential hypertension/malignant  HTN Not tat goal.  Given Clonidine today in clinic Inc Coreg to 6.25 mg BID Clin pharmacist appt in 1 wk for further titration. Initial blood pressure today in clinic was 209/121.  Low dose of clonidine given with subsequent decrease in blood pressure to 190/105 - carvedilol (COREG) 6.25 MG tablet; TAKE 1 TABLET BY MOUTH TWICE DAILY WITH A MEAL  Dispense: 60 tablet; Refill: 6 - cloNIDine (CATAPRES) tablet 0.1 mg  2.  CKD (chronic kidney disease) stage 3, GFR 30-59 ml/min (HCC) -await neph appt. Continue Lisinopril  3. Macroalbuminuric diabetic nephropathy ([place]) See # 2 above  4. Diabetes mellitus type 2, uncontrolled, with complications ([place]) Rxn sen to pharmacy for testing supplies.  Advised to check BS 1-2 times as day and bring in readings on next visit - POCT glucose (manual entry) - POCT glycosylated hemoglobin (Hb A1C) - glucose blood (TRUE METRIX BLOOD GLUCOSE TEST) test strip; Use as instructed  Dispense: 100 each; Refill: 12 - TRUEPLUS LANCETS 28G MISC; 1 each by Does not apply route 3 (three) times daily before meals.  Dispense: 100 each; Refill: 12 - Blood Glucose Monitoring Suppl (TRUE METRIX METER) w/Device KIT; Use as directed  Dispense: 1 kit; Refill: 0  Patient was given the opportunity to ask questions.  Patient verbalized understanding of the plan and was able to repeat key elements of the plan.   Orders Placed This Encounter  Procedures  . POCT glucose (manual entry)  . POCT glycosylated hemoglobin (Hb A1C)     Requested Prescriptions   Signed Prescriptions Disp Refills  . carvedilol (COREG) 6.25 MG tablet 60 tablet 6    Sig: TAKE 1 TABLET BY MOUTH TWICE DAILY WITH A MEAL  . glucose blood (TRUE METRIX BLOOD GLUCOSE TEST) test strip 100 each 12    Sig: Use as instructed  . TRUEPLUS LANCETS 28G MISC 100 each 12    Sig: 1 each by Does not apply route 3 (three) times daily before meals.  . Blood Glucose Monitoring Suppl (TRUE METRIX METER) w/Device KIT 1 kit 0     Sig: Use as directed  . Lancets (ACCU-CHEK SOFT TOUCH) lancets 100 each 12    Sig: Use as instructed    Return in about 1 month (around 03/18/2018).  Karle Plumber, MD, FACP

## 2018-02-18 NOTE — Progress Notes (Signed)
   S:    Patient arrives in good spirits.  Presents to the clinic for BP re-check and beta-blocker titration at the request of Dr. Wynetta Emery. Patient was referred on 02/15/2018 and was last seen by PCP on that date. Pertinent medical history includes: CVA, combined systolic and diastolic HF, CAD, V9YI, CKD.   Denies any chest pain, SOB, dizziness, or HA at this time.    At last clinic visit, pt's pressure was 190/105 after receiving clonidine in clinic. Of note, pt reported compliance with medications but told Dr. Wynetta Emery he did not know carvedilol was BID. Was instructed to take @ 6.25mg  BID at that time.     Patient reports adherence with medications. Verbalizes understanding of changes that were made last time.   Current BP Medications include:  Carvedilol 6.25mg  BID, Bidil 2 tabs daily, lisinopril 10mg  daily  Dietary habits include: Pt states that he is not eating salt unless eating at a relative's house. Additionally, pt reports consuming caffeine via diet coke. States that he is trying to cut back on that and increase water intake.   Exercise habits include: Pt is able to walk and perform yard work.   Family / Social history:  -Former smoker at [employer] packs/day for 14 years. Quit 1992 -DM in father  ASCVD risk: Pt has established ASCVD (CVA, CAD)  Home BP readings: Pt states he has a meter at home but does not have any recordings with him today.   O:  Physical Exam   ROS  Last 3 Office BP readings: BP Readings from Last 3 Encounters:  02/15/18 (!) 190/105  01/11/18 (!) 191/107  12/10/17 (!) 197/110    BMET    Component Value Date/Time   NA 144 12/10/2017 1451   K 4.0 12/10/2017 1451   CL 107 (H) 12/10/2017 1451   CO2 22 12/10/2017 1451   GLUCOSE 111 (H) 12/10/2017 1451   GLUCOSE 149 (H) 09/06/2015 1133   BUN 22 12/10/2017 1451   CREATININE 1.82 (H) 12/10/2017 1451   CREATININE 1.19 09/06/2015 1133   CALCIUM 8.5 (L) 12/10/2017 1451   GFRNONAA 39 (L) 12/10/2017 1451     GFRAA 45 (L) 12/10/2017 1451    Renal function: CrCl cannot be calculated (Patient's most recent lab result is older than the maximum 21 days allowed.).  A/P: Hypertension longstanding currently uncontrolled on current medications. BP Goal <130/80 mmHg. Patient is adherent with current medications. BP elevated in clinic but improved from last week's reading.   - Increase carvedilol from 6.25mg  BID to 12.5mg  BID. Pt has 6.25mg  tablets on hand. I have instructed him take 2 of these BID.   -Counseled on lifestyle modifications for blood pressure control including reduced dietary sodium, increased exercise, adequate sleep. -Take home pressures/Bring in next visit -Advised pt to call with any medication side effect concerns, low pulse rate, or low pressures. Will assess next week.   Results reviewed and written information provided. Total time in face-to-face counseling 30 minutes.   F/U PharmD visit 03/02/18.   Benard Halsted, PharmD [facility] [phone]

## 2018-02-22 ENCOUNTER — Encounter: Payer: Self-pay | Admitting: Pharmacist

## 2018-02-22 ENCOUNTER — Ambulatory Visit: Payer: Medicare Other | Attending: Internal Medicine | Admitting: Pharmacist

## 2018-02-22 VITALS — BP 174/94 | HR 80

## 2018-02-22 DIAGNOSIS — E1122 Type 2 diabetes mellitus with diabetic chronic kidney disease: Secondary | ICD-10-CM | POA: Diagnosis not present

## 2018-02-22 DIAGNOSIS — E118 Type 2 diabetes mellitus with unspecified complications: Secondary | ICD-10-CM | POA: Diagnosis not present

## 2018-02-22 DIAGNOSIS — Z833 Family history of diabetes mellitus: Secondary | ICD-10-CM | POA: Diagnosis not present

## 2018-02-22 DIAGNOSIS — Z0130 Encounter for examination of blood pressure without abnormal findings: Secondary | ICD-10-CM | POA: Insufficient documentation

## 2018-02-22 DIAGNOSIS — I639 Cerebral infarction, unspecified: Secondary | ICD-10-CM | POA: Insufficient documentation

## 2018-02-22 DIAGNOSIS — N189 Chronic kidney disease, unspecified: Secondary | ICD-10-CM | POA: Diagnosis not present

## 2018-02-22 DIAGNOSIS — I10 Essential (primary) hypertension: Secondary | ICD-10-CM

## 2018-02-22 DIAGNOSIS — Z87891 Personal history of nicotine dependence: Secondary | ICD-10-CM | POA: Insufficient documentation

## 2018-02-22 NOTE — Patient Instructions (Addendum)
Thank you for coming to see me today. Please do the following:   1. Increase your carvedilol to 12.5 mg twice daily. Since you have the 6.25mg  tablets, you'll need to take 2 of these twice daily.   2. Continue making healthy diet choices (fresh fruits, vegetables, limiting salt to less than 1500mg /day, and limiting caffeine).   3. Try to take some home blood pressures. Record these and bring these in to your next visit.

## 2018-02-25 NOTE — Progress Notes (Signed)
   S:    PCP: Dr. Wynetta Emery  Patient arrives in good spirits.  Presents to the clinic hypertension education and management. Patient was referred on 02/15/2018 and was last seen by PCP on that date. Pertinent medical history includes: CVA, combined systolic and diastolic HF, CAD, R7EY, CKD.   Pt last saw me on 02/22/18. Coreg was increased from 6.25mg  to 12.5mg  BID. Pt states he is adjusting to this. Verbalizes understanding of changes that were made last time.  Denies any chest pain, SOB, dizziness, or HA at this time.   Patient reports adherence with medications.  Current BP Medications include:   -Carvedilol 12.5mg  BID -Bidil 2 tabs daily -Lisinopril 10mg  daily  Dietary habits include:  Pt reports no changes.   Exercise habits include: Pt is able to walk and perform yard work.   Family / Social history:  -Former smoker at [employer] packs/day for 14 years. Quit 1992 -DM in father  ASCVD risk: Pt has established ASCVD (CVA, CAD)  Home BP readings: Pt states wife was able to purchase wrist BP meter. States that he got 1 good reading and that all others were "errors". One good reading states was 174/94.   O:  Physical Exam   ROS  Last 3 Office BP readings: BP Readings from Last 3 Encounters:  02/22/18 (!) 174/94  02/15/18 (!) 190/105  01/11/18 (!) 191/107    BMET    Component Value Date/Time   NA 144 12/10/2017 1451   K 4.0 12/10/2017 1451   CL 107 (H) 12/10/2017 1451   CO2 22 12/10/2017 1451   GLUCOSE 111 (H) 12/10/2017 1451   GLUCOSE 149 (H) 09/06/2015 1133   BUN 22 12/10/2017 1451   CREATININE 1.82 (H) 12/10/2017 1451   CREATININE 1.19 09/06/2015 1133   CALCIUM 8.5 (L) 12/10/2017 1451   GFRNONAA 39 (L) 12/10/2017 1451   GFRAA 45 (L) 12/10/2017 1451    Renal function: CrCl cannot be calculated (Patient's most recent lab result is older than the maximum 21 days allowed.).  A/P: Hypertension longstanding currently uncontrolled on current medications. BP Goal <130/80  mmHg. Patient is adherent with current medications. BP elevated in clinic at 186/94. Pt's PCP not in clinic today.   Patient transferred to Dr. Smitty Pluck schedule. She will see him today.  Benard Halsted, PharmD, [facility] [phone]

## 2018-03-02 ENCOUNTER — Ambulatory Visit: Payer: Medicare Other | Attending: Family Medicine | Admitting: Family Medicine

## 2018-03-02 ENCOUNTER — Encounter: Payer: Self-pay | Admitting: Pharmacist

## 2018-03-02 VITALS — BP 186/94 | HR 74

## 2018-03-02 DIAGNOSIS — Z8673 Personal history of transient ischemic attack (TIA), and cerebral infarction without residual deficits: Secondary | ICD-10-CM | POA: Insufficient documentation

## 2018-03-02 DIAGNOSIS — K219 Gastro-esophageal reflux disease without esophagitis: Secondary | ICD-10-CM | POA: Diagnosis not present

## 2018-03-02 DIAGNOSIS — Z7982 Long term (current) use of aspirin: Secondary | ICD-10-CM | POA: Insufficient documentation

## 2018-03-02 DIAGNOSIS — E785 Hyperlipidemia, unspecified: Secondary | ICD-10-CM | POA: Insufficient documentation

## 2018-03-02 DIAGNOSIS — E1122 Type 2 diabetes mellitus with diabetic chronic kidney disease: Secondary | ICD-10-CM | POA: Insufficient documentation

## 2018-03-02 DIAGNOSIS — N183 Chronic kidney disease, stage 3 (moderate): Secondary | ICD-10-CM | POA: Insufficient documentation

## 2018-03-02 DIAGNOSIS — Z794 Long term (current) use of insulin: Secondary | ICD-10-CM | POA: Diagnosis not present

## 2018-03-02 DIAGNOSIS — I509 Heart failure, unspecified: Secondary | ICD-10-CM | POA: Diagnosis not present

## 2018-03-02 DIAGNOSIS — I2510 Atherosclerotic heart disease of native coronary artery without angina pectoris: Secondary | ICD-10-CM | POA: Insufficient documentation

## 2018-03-02 DIAGNOSIS — Z9889 Other specified postprocedural states: Secondary | ICD-10-CM | POA: Diagnosis not present

## 2018-03-02 DIAGNOSIS — I252 Old myocardial infarction: Secondary | ICD-10-CM | POA: Diagnosis not present

## 2018-03-02 DIAGNOSIS — Z79899 Other long term (current) drug therapy: Secondary | ICD-10-CM | POA: Diagnosis not present

## 2018-03-02 DIAGNOSIS — I10 Essential (primary) hypertension: Secondary | ICD-10-CM

## 2018-03-02 DIAGNOSIS — I130 Hypertensive heart and chronic kidney disease with heart failure and stage 1 through stage 4 chronic kidney disease, or unspecified chronic kidney disease: Secondary | ICD-10-CM | POA: Diagnosis not present

## 2018-03-02 DIAGNOSIS — Z955 Presence of coronary angioplasty implant and graft: Secondary | ICD-10-CM | POA: Diagnosis not present

## 2018-03-02 MED ORDER — CARVEDILOL 25 MG PO TABS: 25.0000 mg | ORAL_TABLET | Freq: Two times a day (BID) | ORAL | 3 refills | Status: DC

## 2018-03-02 MED FILL — CARVEDILOL 25 MG TABLET: 25 | 30 days supply | Qty: 60 | Fill #0

## 2018-03-02 NOTE — Progress Notes (Signed)
Subjective:  Patient ID: Timothy Lambert, male    DOB: [birth year]  Age: 62 y.o. MRN: [id number]  CC: Hypertension  HPI Timothy Lambert is a 62 year old male with a history of Hypertension, Type 2 DM (A1c 7.8), stage 3 CKD, CAD, CHF (EF 50-55%) here to see the clinical pharmacist and found to have an elevated blood pressure of 186/94. His Coreg had been increased to 12.11m bid by the clinical pharmacist at his last visit and he endorses medication compliance and compliance with a low sodium diet. Denies chest pain,headacahes,blurry vision.   Past Medical History:  Diagnosis Date  . Acute renal failure (ARF) ([facility] 02/19/2015  . CHF (congestive heart failure) ([facility]   . Coronary artery disease   . GERD (gastroesophageal reflux disease)   . HLD (hyperlipidemia)   . Hypertension   . Myocardial infarction ([facility] 01/22/2015   "massive"  . Refusal of blood transfusions as patient is Jehovah's Witness   . Seizures (Timothy [facility] 01/22/2015   "in front of paramedics"  . Stroke (Timothy [facility] 01/22/2015   "short term memory loss & right side weak since" (02/19/2015)  . Type II diabetes mellitus ([facility]     Past Surgical History:  Procedure Laterality Date  . CARDIAC CATHETERIZATION N/A 01/23/2015   Procedure: Left Heart Cath and Coronary Angiography;  Surgeon: MSherren Mocha Lambert;  Location: [facility];  Service: Cardiovascular;  Laterality: N/A;  . CARDIAC CATHETERIZATION N/A 01/23/2015   Procedure: Coronary Stent Intervention;  Surgeon: MSherren Mocha Lambert;  Location: [facility];  Service: Cardiovascular;  Laterality: N/A;    No Known Allergies    Outpatient Medications Prior to Visit  Medication Sig Dispense Refill  . aspirin EC 81 MG tablet Take 1 tablet (81 mg total) by mouth daily. 100 tablet 1  . atorvastatin (LIPITOR) 40 MG tablet Take 1 tablet (40 mg total) by mouth daily at 6 PM. 90 tablet 1  . Blood Glucose Monitoring Suppl (TRUE METRIX METER) w/Device KIT Use as directed 1  kit 0  . furosemide (LASIX) 20 MG tablet Take 1 tablet (20 mg total) by mouth daily. 30 tablet 3  . glucose blood (TRUE METRIX BLOOD GLUCOSE TEST) test strip Use as instructed 100 each 12  . Insulin Glargine (LANTUS SOLOSTAR) 100 UNIT/ML Solostar Pen Inject 26 Units into the skin daily at 10 pm. 15 mL 3  . Insulin Pen Needle (TRUEPLUS PEN NEEDLES) 32G X 4 MM MISC USE TO INJECT INSULIN AS DIRECTED 100 each 5  . Insulin Syringe-Needle U-100 (INSULIN SYRINGE .5CC/30GX1/2") 30G X 1/2" 0.5 ML MISC Check blood sugar TID & QHS 100 each 2  . isosorbide-hydrALAZINE (BIDIL) 20-37.5 MG tablet Take 2 tablets by mouth 2 (two) times daily. 120 tablet 5  . Lancets (ACCU-CHEK SOFT TOUCH) lancets Use as instructed 100 each 12  . lisinopril (PRINIVIL,ZESTRIL) 10 MG tablet Take 1 tablet (10 mg total) by mouth daily. 30 tablet 3  . TRUEPLUS LANCETS 28G MISC 1 each by Does not apply route 3 (three) times daily before meals. 100 each 12  . carvedilol (COREG) 6.25 MG tablet TAKE 1 TABLET BY MOUTH TWICE DAILY WITH A MEAL 60 tablet 6   No facility-administered medications prior to visit.     ROS Review of Systems  Objective:  BP (!) 186/94   Pulse 74   BP/Weight 03/02/2018 02/22/2018 52/77/4128 Systolic BP [id number] Diastolic BP 94 94 1470 Wt. (Lbs) - - 172.2  BMI - - 27.79      Physical Exam  Constitutional: He is oriented to person, place, and time. He appears well-developed and well-nourished.  Cardiovascular: Normal rate, normal heart sounds and intact distal pulses.  No murmur heard. Pulmonary/Chest: Effort normal and breath sounds normal. He has no wheezes. He has no rales. He exhibits no tenderness.  Abdominal: Soft. Bowel sounds are normal. He exhibits no distension and no mass. There is no tenderness.  Musculoskeletal: Normal range of motion.  Neurological: He is alert and oriented to person, place, and time.     Assessment & Plan:   1. Essential hypertension Uncontrolled Increase dose  of Coreg Counseled on blood pressure goal of less than 130/80, low-sodium, DASH diet, medication compliance, 150 minutes of moderate intensity exercise per week. Discussed medication compliance, adverse effects. BP to be reassessed at [facility] PCP visit. - carvedilol (COREG) 25 MG tablet; Take 1 tablet (25 mg total) by mouth 2 (two) times daily with a meal.  Dispense: 60 tablet; Refill: 3   Meds ordered this encounter  Medications  . carvedilol (COREG) 25 MG tablet    Sig: Take 1 tablet (25 mg total) by mouth 2 (two) times daily with a meal.    Dispense:  60 tablet    Refill:  3    Discontinue previous dose    Follow-up: Return for follow up of chronic medical conditions, keep previously scheduled appointment.   Charlott Rakes Lambert

## 2018-03-02 NOTE — Patient Instructions (Addendum)

## 2018-03-18 ENCOUNTER — Ambulatory Visit: Payer: Medicare Other | Attending: Internal Medicine | Admitting: Internal Medicine

## 2018-03-18 ENCOUNTER — Encounter: Payer: Self-pay | Admitting: Internal Medicine

## 2018-03-18 VITALS — BP 185/98 | HR 86 | Temp 98.3°F | Resp 16 | Wt 178.4 lb

## 2018-03-18 DIAGNOSIS — I10 Essential (primary) hypertension: Secondary | ICD-10-CM | POA: Diagnosis not present

## 2018-03-18 DIAGNOSIS — Z955 Presence of coronary angioplasty implant and graft: Secondary | ICD-10-CM | POA: Diagnosis not present

## 2018-03-18 DIAGNOSIS — I2510 Atherosclerotic heart disease of native coronary artery without angina pectoris: Secondary | ICD-10-CM | POA: Diagnosis not present

## 2018-03-18 DIAGNOSIS — Z8673 Personal history of transient ischemic attack (TIA), and cerebral infarction without residual deficits: Secondary | ICD-10-CM | POA: Diagnosis not present

## 2018-03-18 DIAGNOSIS — Z87891 Personal history of nicotine dependence: Secondary | ICD-10-CM | POA: Diagnosis not present

## 2018-03-18 DIAGNOSIS — E1165 Type 2 diabetes mellitus with hyperglycemia: Secondary | ICD-10-CM

## 2018-03-18 DIAGNOSIS — I129 Hypertensive chronic kidney disease with stage 1 through stage 4 chronic kidney disease, or unspecified chronic kidney disease: Secondary | ICD-10-CM | POA: Diagnosis not present

## 2018-03-18 DIAGNOSIS — I480 Paroxysmal atrial fibrillation: Secondary | ICD-10-CM | POA: Insufficient documentation

## 2018-03-18 DIAGNOSIS — N183 Chronic kidney disease, stage 3 (moderate): Secondary | ICD-10-CM | POA: Diagnosis not present

## 2018-03-18 DIAGNOSIS — Z09 Encounter for follow-up examination after completed treatment for conditions other than malignant neoplasm: Secondary | ICD-10-CM | POA: Diagnosis not present

## 2018-03-18 DIAGNOSIS — Z7982 Long term (current) use of aspirin: Secondary | ICD-10-CM | POA: Diagnosis not present

## 2018-03-18 DIAGNOSIS — R569 Unspecified convulsions: Secondary | ICD-10-CM | POA: Diagnosis not present

## 2018-03-18 DIAGNOSIS — E11319 Type 2 diabetes mellitus with unspecified diabetic retinopathy without macular edema: Secondary | ICD-10-CM | POA: Diagnosis not present

## 2018-03-18 DIAGNOSIS — E1121 Type 2 diabetes mellitus with diabetic nephropathy: Secondary | ICD-10-CM | POA: Diagnosis not present

## 2018-03-18 DIAGNOSIS — Z794 Long term (current) use of insulin: Secondary | ICD-10-CM | POA: Insufficient documentation

## 2018-03-18 DIAGNOSIS — E1122 Type 2 diabetes mellitus with diabetic chronic kidney disease: Secondary | ICD-10-CM | POA: Diagnosis not present

## 2018-03-18 DIAGNOSIS — Z79899 Other long term (current) drug therapy: Secondary | ICD-10-CM | POA: Diagnosis not present

## 2018-03-18 DIAGNOSIS — E785 Hyperlipidemia, unspecified: Secondary | ICD-10-CM | POA: Diagnosis not present

## 2018-03-18 MED ORDER — LISINOPRIL 10 MG PO TABS: 15.0000 mg | ORAL_TABLET | Freq: Every day | ORAL | 3 refills | Status: DC

## 2018-03-18 MED FILL — LISINOPRIL 10 MG TABS: 10 | 30 days supply | Qty: 45 | Fill #0

## 2018-03-18 MED FILL — FUROSEMIDE 20 MG TABLET: 20 | 30 days supply | Qty: 30 | Fill #2

## 2018-03-18 NOTE — Progress Notes (Signed)
Patient ID: TIN ENGRAM, male    DOB: 06-01-1956  MRN: [id number]  CC: Follow-up   Subjective: Timothy Lambert is a 62 y.o. male who presents for UC visit for f/u BP His concerns today include:  Pt with hx of DM, HTN, HL, CAD with DES LAD 2016,embolicCVA, PAF (thought to be a poor candidate for anticoagulation because of history of EtOH abuse at the time of his CVA in 2016) ETOH withdrawal sz, CKD stage 3, macroalbuminuria    HTN:  Since last visit with me, he saw the clinical pharmacist and Dr. Margarita Rana.  Coreg increased to 25 mg BID.  Reports compliance with the increase dose Coreg, Bidil, Lisinopril.  Limits salt restriction -has a wrist device but not working.  No CP/SOB/LE edema  CKD:  appt with nephrology 03/29/2018   Patient Active Problem List   Diagnosis Date Noted  . CKD (chronic kidney disease) stage 3, GFR 30-59 ml/min (HCC) 02/15/2018  . Macroalbuminuric diabetic nephropathy ([place]) 02/15/2018  . Diabetic retinopathy ([place]) 11/18/2015  . Paroxysmal atrial fibrillation ([place]) 10/28/2015  . Cognitive deficit, post-stroke 05/13/2015  . Essential hypertension 02/19/2015  . CAD (coronary artery disease) 02/19/2015  . 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 CVA (cerebral vascular accident) ([place])   . Combined systolic and diastolic congestive heart failure ([place])   . Diabetes type 2, uncontrolled ([place])   . Seizure [facility])      Current Outpatient Medications on File Prior to Visit  Medication Sig Dispense Refill  . aspirin EC 81 MG tablet Take 1 tablet (81 mg total) by mouth daily. 100 tablet 1  . atorvastatin (LIPITOR) 40 MG tablet Take 1 tablet (40 mg total) by mouth daily at 6 PM. 90 tablet 1  . Blood Glucose Monitoring Suppl (TRUE METRIX METER) w/Device KIT Use as directed 1 kit 0  . carvedilol (COREG) 25 MG tablet Take 1 tablet (25 mg total) by mouth 2 (two) times daily with a meal. 60 tablet 3  . furosemide (LASIX) 20 MG tablet Take 1 tablet (20 mg total) by  mouth daily. 30 tablet 3  . glucose blood (TRUE METRIX BLOOD GLUCOSE TEST) test strip Use as instructed 100 each 12  . Insulin Glargine (LANTUS SOLOSTAR) 100 UNIT/ML Solostar Pen Inject 26 Units into the skin daily at 10 pm. 15 mL 3  . Insulin Pen Needle (TRUEPLUS PEN NEEDLES) 32G X 4 MM MISC USE TO INJECT INSULIN AS DIRECTED 100 each 5  . Insulin Syringe-Needle U-100 (INSULIN SYRINGE .5CC/30GX1/2") 30G X 1/2" 0.5 ML MISC Check blood sugar TID & QHS 100 each 2  . isosorbide-hydrALAZINE (BIDIL) 20-37.5 MG tablet Take 2 tablets by mouth 2 (two) times daily. 120 tablet 5  . Lancets (ACCU-CHEK SOFT TOUCH) lancets Use as instructed 100 each 12  . TRUEPLUS LANCETS 28G MISC 1 each by Does not apply route 3 (three) times daily before meals. 100 each 12   No current facility-administered medications on file prior to visit.     No Known Allergies  Social History   Socioeconomic History  . Marital status: Married    Spouse name: Not on file  . Number of children: Not on file  . Years of education: Not on file  . Highest education level: Not on file  Occupational History  . Not on file  Social Needs  . Financial resource strain: Not on file  . Food insecurity:    Worry: Not on file    Inability: Not on file  .  Transportation needs:    Medical: Not on file    Non-medical: Not on file  Tobacco Use  . Smoking status: Former Smoker    Packs/day: 0.33    Years: 14.00    Pack years: 4.62    Types: Cigarettes  . Smokeless tobacco: Never Used  . Tobacco comment: "quit smoking cigarettes in 1992"  Substance and Sexual Activity  . Alcohol use: No    Comment: 02/19/2015 "stopped drinking 01/22/2015"  . Drug use: No  . Sexual activity: Not Currently  Lifestyle  . Physical activity:    Days per week: Not on file    Minutes per session: Not on file  . Stress: Not on file  Relationships  . Social connections:    Talks on phone: Not on file    Gets together: Not on file    Attends religious  service: Not on file    Active member of club or organization: Not on file    Attends meetings of clubs or organizations: Not on file    Relationship status: Not on file  . Intimate partner violence:    Fear of current or ex partner: Not on file    Emotionally abused: Not on file    Physically abused: Not on file    Forced sexual activity: Not on file  Other Topics Concern  . Not on file  Social History Narrative   Lives   Caffeine use: Coffee sometimes    Family History  Problem Relation Age of Onset  . Diabetes Father   . Seizures Neg Hx     Past Surgical History:  Procedure Laterality Date  . CARDIAC CATHETERIZATION N/A 01/23/2015   Procedure: Left Heart Cath and Coronary Angiography;  Surgeon: [name], MD;  Location: [facility] INVASIVE CV LAB CUPID;  Service: Cardiovascular;  Laterality: N/A;  . CARDIAC CATHETERIZATION N/A 01/23/2015   Procedure: Coronary Stent Intervention;  Surgeon: [name], MD;  Location: [facility] INVASIVE CV LAB CUPID;  Service: Cardiovascular;  Laterality: N/A;    ROS: Review of Systems Neg except as above  PHYSICAL EXAM: BP (!) 185/98   Pulse 86   Temp 98.3 F (36.8 C) (Oral)   Resp 16   Wt 178 lb 6.4 oz (80.9 kg)   SpO2 98%   BMI 28.79 kg/m   Repeat 170/94 Physical Exam  General appearance - alert, well appearing, and in no distress Mental status - normal mood, behavior, speech, dress, motor activity, and thought processes Chest - clear to auscultation, no wheezes, rales or rhonchi, symmetric air entry Heart - normal rate, regular rhythm, normal S1, S2, no murmurs, rubs, clicks or gallops Extremities - peripheral pulses normal, no pedal edema, no clubbing or cyanosis  Results for orders placed or performed in visit on 02/15/18  POCT glucose (manual entry)  Result Value Ref Range   POC Glucose 92 70 - 99 mg/dl  POCT glycosylated hemoglobin (Hb A1C)  Result Value Ref Range   Hemoglobin A1C  4.0 - 5.6 %   HbA1c, POC (prediabetic range)   5.7 - 6.4 %   HbA1c, POC (controlled diabetic range) 7.8 (A) 0.0 - 7.0 %    ASSESSMENT AND PLAN: 1. Essential hypertension Not at goal.  Increase Lisinopril to 15 mg daily. Will ned to have repeat BMP in 1 wk but we decided to wait until he sees nephrology as specialist will probably draw labs Keep appt with nephrology - lisinopril (PRINIVIL,ZESTRIL) 10 MG tablet; Take 1.5 tablets (15 mg total) by  mouth daily.  Dispense: 45 tablet; Refill: 3  2. Uncontrolled type 2 diabetes mellitus with hyperglycemia ([place]) Not addressed today. - POCT glucose (manual entry)  Patient was given the opportunity to ask questions.  Patient verbalized understanding of the plan and was able to repeat key elements of the plan.   Orders Placed This Encounter  Procedures  . POCT glucose (manual entry)     Requested Prescriptions   Signed Prescriptions Disp Refills  . lisinopril (PRINIVIL,ZESTRIL) 10 MG tablet 45 tablet 3    Sig: Take 1.5 tablets (15 mg total) by mouth daily.    Return in about 7 weeks (around 05/06/2018).  Karle Plumber, MD, FACP

## 2018-03-18 NOTE — Progress Notes (Signed)
150

## 2018-03-18 NOTE — Patient Instructions (Signed)
Increase Lisinopril to 15 mg once a day.  This means you will start taking 1 1/2 tablets of the 10 mg tablets.

## 2018-03-21 LAB — GLUCOSE, POCT (MANUAL RESULT ENTRY): POC GLUCOSE: 150 mg/dL — AB (ref 70–99)

## 2018-03-22 MED FILL — LANTUS SOLOSTAR 100 UNITS/M: 100 | 30 days supply | Qty: 9 | Fill #2

## 2018-03-22 MED FILL — BIDIL TABLET: 20-37.5 | 30 days supply | Qty: 120 | Fill #2

## 2018-03-29 DIAGNOSIS — I129 Hypertensive chronic kidney disease with stage 1 through stage 4 chronic kidney disease, or unspecified chronic kidney disease: Secondary | ICD-10-CM | POA: Diagnosis not present

## 2018-03-29 DIAGNOSIS — N183 Chronic kidney disease, stage 3 (moderate): Secondary | ICD-10-CM | POA: Diagnosis not present

## 2018-03-29 DIAGNOSIS — R809 Proteinuria, unspecified: Secondary | ICD-10-CM | POA: Diagnosis not present

## 2018-03-29 DIAGNOSIS — D631 Anemia in chronic kidney disease: Secondary | ICD-10-CM | POA: Diagnosis not present

## 2018-03-30 ENCOUNTER — Other Ambulatory Visit: Payer: Self-pay | Admitting: Internal Medicine

## 2018-03-30 DIAGNOSIS — N183 Chronic kidney disease, stage 3 unspecified: Secondary | ICD-10-CM

## 2018-04-01 MED FILL — CARVEDILOL 25 MG TABLET: 25 | 90 days supply | Qty: 180 | Fill #1

## 2018-04-06 ENCOUNTER — Encounter: Payer: Self-pay | Admitting: Internal Medicine

## 2018-04-06 NOTE — Progress Notes (Signed)
Saw Dr. Johnney Ou of [facility].  Dx with DM nephropathy.  PTH 135, eGFR 41.  Creat 1.98.  Lisinopril increased to 20 mg.

## 2018-04-19 ENCOUNTER — Ambulatory Visit
Admission: RE | Admit: 2018-04-19 | Discharge: 2018-04-19 | Disposition: A | Discharge status: Home / Self Care | Payer: Medicare Other | Source: Ambulatory Visit | Attending: Internal Medicine | Admitting: Internal Medicine

## 2018-04-19 DIAGNOSIS — N183 Chronic kidney disease, stage 3 unspecified: Secondary | ICD-10-CM

## 2018-04-19 DIAGNOSIS — N189 Chronic kidney disease, unspecified: Secondary | ICD-10-CM | POA: Diagnosis not present

## 2018-04-21 MED FILL — LISINOPRIL 10 MG TABS: 10 | 30 days supply | Qty: 45 | Fill #1

## 2018-04-21 MED FILL — FUROSEMIDE 20 MG TABLET: 20 | 30 days supply | Qty: 30 | Fill #3

## 2018-04-21 MED FILL — LANTUS SOLOSTAR 100 UNITS/M: 100 | 30 days supply | Qty: 9 | Fill #3

## 2018-04-21 MED FILL — BIDIL TABLET: 20-37.5 | 30 days supply | Qty: 120 | Fill #3

## 2018-04-22 MED FILL — TRUEPLUS PEN NDL 32GX5/32: 32G X 4 MM | 30 days supply | Qty: 100 | Fill #1

## 2018-04-22 MED FILL — TRUEPLUS PEN NDL 32GX5/32": 32G X 4 MM | 30 days supply | Qty: 100 | Fill #1

## 2018-05-06 ENCOUNTER — Encounter: Payer: Self-pay | Admitting: Internal Medicine

## 2018-05-06 ENCOUNTER — Ambulatory Visit: Payer: Medicare Other | Attending: Internal Medicine | Admitting: Internal Medicine

## 2018-05-06 VITALS — BP 169/77 | HR 77 | Temp 98.5°F | Ht 66.0 in | Wt 176.4 lb

## 2018-05-06 DIAGNOSIS — E11319 Type 2 diabetes mellitus with unspecified diabetic retinopathy without macular edema: Secondary | ICD-10-CM | POA: Insufficient documentation

## 2018-05-06 DIAGNOSIS — Z794 Long term (current) use of insulin: Secondary | ICD-10-CM | POA: Diagnosis not present

## 2018-05-06 DIAGNOSIS — Z8673 Personal history of transient ischemic attack (TIA), and cerebral infarction without residual deficits: Secondary | ICD-10-CM | POA: Diagnosis not present

## 2018-05-06 DIAGNOSIS — E1121 Type 2 diabetes mellitus with diabetic nephropathy: Secondary | ICD-10-CM | POA: Diagnosis not present

## 2018-05-06 DIAGNOSIS — Z87891 Personal history of nicotine dependence: Secondary | ICD-10-CM | POA: Insufficient documentation

## 2018-05-06 DIAGNOSIS — I480 Paroxysmal atrial fibrillation: Secondary | ICD-10-CM | POA: Diagnosis not present

## 2018-05-06 DIAGNOSIS — I2510 Atherosclerotic heart disease of native coronary artery without angina pectoris: Secondary | ICD-10-CM | POA: Insufficient documentation

## 2018-05-06 DIAGNOSIS — E11649 Type 2 diabetes mellitus with hypoglycemia without coma: Secondary | ICD-10-CM

## 2018-05-06 DIAGNOSIS — N183 Chronic kidney disease, stage 3 unspecified: Secondary | ICD-10-CM

## 2018-05-06 DIAGNOSIS — Z79899 Other long term (current) drug therapy: Secondary | ICD-10-CM | POA: Diagnosis not present

## 2018-05-06 DIAGNOSIS — Z2821 Immunization not carried out because of patient refusal: Secondary | ICD-10-CM

## 2018-05-06 DIAGNOSIS — Z7982 Long term (current) use of aspirin: Secondary | ICD-10-CM | POA: Diagnosis not present

## 2018-05-06 DIAGNOSIS — E118 Type 2 diabetes mellitus with unspecified complications: Secondary | ICD-10-CM

## 2018-05-06 DIAGNOSIS — I129 Hypertensive chronic kidney disease with stage 1 through stage 4 chronic kidney disease, or unspecified chronic kidney disease: Secondary | ICD-10-CM | POA: Insufficient documentation

## 2018-05-06 DIAGNOSIS — I10 Essential (primary) hypertension: Secondary | ICD-10-CM

## 2018-05-06 LAB — POCT GLYCOSYLATED HEMOGLOBIN (HGB A1C): HBA1C, POC (CONTROLLED DIABETIC RANGE): 7.6 % — AB (ref 0.0–7.0)

## 2018-05-06 LAB — GLUCOSE, POCT (MANUAL RESULT ENTRY): POC Glucose: 130 mg/dl — AB (ref 70–99)

## 2018-05-06 MED ORDER — CARVEDILOL 25 MG PO TABS: 37.5000 mg | ORAL_TABLET | Freq: Two times a day (BID) | ORAL | 3 refills | Status: DC

## 2018-05-06 MED ORDER — LISINOPRIL 20 MG PO TABS: 20.0000 mg | ORAL_TABLET | Freq: Every day | ORAL | 1 refills | Status: DC

## 2018-05-06 NOTE — Patient Instructions (Signed)
Increase carvedilol to 1-1/2 tablets twice a day. Please use and mail in the stool card as discussed today. Please schedule the appointment with your eye doctor as soon as possible.

## 2018-05-06 NOTE — Progress Notes (Signed)
  Patient ID: Timothy Lambert, male    DOB: 09/06/1956  MRN: [id number]  CC: Diabetes   Subjective: Timothy Lambert is a 62 y.o. male who presents for chronic ds management His concerns today include:  Pt with hx of DM, HTN, HL, CAD with DES LAD 2016,embolicCVA, PAF (thought to be a poor candidate for anticoagulation because of history of EtOH abuse at the time of his CVA in 2016) ETOH withdrawal sz, CKD stage 3, macroalbuminuria  HTN/macroalbumin: Patient was seen by nephrologist Dr. Kruska since last visit.  He was assessed to have diabetic nephropathy.  Estimated GFR on blood test was 41.  Lisinopril was increased to 20 mg.  no device to check BP -Reports compliance with medications. -no CP/SOB/LE edema Limits salt  DM: eating in moderation Check BS in a.m and evenings.  A.m range 135-140 and evenings highest is 150.  Reports compliance with Lantus 26 units at bedtime. Due for eye exam.  He plans to call and schedule an appointment with his eye doctor in [place].    HM:  Did not do FIT test as yet.  Declines Flu shot  Patient Active Problem List   Diagnosis Date Noted  . CKD (chronic kidney disease) stage 3, GFR 30-59 ml/min (HCC) 02/15/2018  . Macroalbuminuric diabetic nephropathy (HCC) 02/15/2018  . Diabetic retinopathy (HCC) 11/18/2015  . Paroxysmal atrial fibrillation (HCC) 10/28/2015  . Cognitive deficit, post-stroke 05/13/2015  . Essential hypertension 02/19/2015  . CAD (coronary artery disease) 02/19/2015  . 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 CVA (cerebral vascular accident) (HCC)   . Combined systolic and diastolic congestive heart failure (HCC)   . Diabetes type 2, uncontrolled (HCC)   . Seizure (HCC)      Current Outpatient Medications on File Prior to Visit  Medication Sig Dispense Refill  . aspirin EC 81 MG tablet Take 1 tablet (81 mg total) by mouth daily. 100 tablet 1  . atorvastatin (LIPITOR) 40 MG tablet Take 1 tablet (40 mg total)  by mouth daily at 6 PM. 90 tablet 1  . Blood Glucose Monitoring Suppl (TRUE METRIX METER) w/Device KIT Use as directed 1 kit 0  . carvedilol (COREG) 25 MG tablet Take 1 tablet (25 mg total) by mouth 2 (two) times daily with a meal. 60 tablet 3  . furosemide (LASIX) 20 MG tablet Take 1 tablet (20 mg total) by mouth daily. 30 tablet 3  . glucose blood (TRUE METRIX BLOOD GLUCOSE TEST) test strip Use as instructed 100 each 12  . Insulin Glargine (LANTUS SOLOSTAR) 100 UNIT/ML Solostar Pen Inject 26 Units into the skin daily at 10 pm. 15 mL 3  . Insulin Pen Needle (TRUEPLUS PEN NEEDLES) 32G X 4 MM MISC USE TO INJECT INSULIN AS DIRECTED 100 each 5  . Insulin Syringe-Needle U-100 (INSULIN SYRINGE .5CC/30GX1/2") 30G X 1/2" 0.5 ML MISC Check blood sugar TID & QHS 100 each 2  . isosorbide-hydrALAZINE (BIDIL) 20-37.5 MG tablet Take 2 tablets by mouth 2 (two) times daily. 120 tablet 5  . Lancets (ACCU-CHEK SOFT TOUCH) lancets Use as instructed 100 each 12  . lisinopril (PRINIVIL,ZESTRIL) 10 MG tablet Take 1.5 tablets (15 mg total) by mouth daily. 45 tablet 3  . TRUEPLUS LANCETS 28G MISC 1 each by Does not apply route 3 (three) times daily before meals. 100 each 12   No current facility-administered medications on file prior to visit.     No Known Allergies  Social History   Socioeconomic History  .   Marital status: Married    Spouse name: Not on file  . Number of children: Not on file  . Years of education: Not on file  . Highest education level: Not on file  Occupational History  . Not on file  Social Needs  . Financial resource strain: Not on file  . Food insecurity:    Worry: Not on file    Inability: Not on file  . Transportation needs:    Medical: Not on file    Non-medical: Not on file  Tobacco Use  . Smoking status: Former Smoker    Packs/day: 0.33    Years: 14.00    Pack years: 4.62    Types: Cigarettes  . Smokeless tobacco: Never Used  . Tobacco comment: "quit smoking cigarettes  in 1992"  Substance and Sexual Activity  . Alcohol use: No    Comment: 02/19/2015 "stopped drinking 01/22/2015"  . Drug use: No  . Sexual activity: Not Currently  Lifestyle  . Physical activity:    Days per week: Not on file    Minutes per session: Not on file  . Stress: Not on file  Relationships  . Social connections:    Talks on phone: Not on file    Gets together: Not on file    Attends religious service: Not on file    Active member of club or organization: Not on file    Attends meetings of clubs or organizations: Not on file    Relationship status: Not on file  . Intimate partner violence:    Fear of current or ex partner: Not on file    Emotionally abused: Not on file    Physically abused: Not on file    Forced sexual activity: Not on file  Other Topics Concern  . Not on file  Social History Narrative   Lives   Caffeine use: Coffee sometimes    Family History  Problem Relation Age of Onset  . Diabetes Father   . Seizures Neg Hx     Past Surgical History:  Procedure Laterality Date  . CARDIAC CATHETERIZATION N/A 01/23/2015   Procedure: Left Heart Cath and Coronary Angiography;  Surgeon: Michael Cooper, MD;  Location: [facility] INVASIVE CV LAB CUPID;  Service: Cardiovascular;  Laterality: N/A;  . CARDIAC CATHETERIZATION N/A 01/23/2015   Procedure: Coronary Stent Intervention;  Surgeon: Michael Cooper, MD;  Location: [facility] INVASIVE CV LAB CUPID;  Service: Cardiovascular;  Laterality: N/A;    ROS: Review of Systems Negative except as stated above   PHYSICAL EXAM: BP (!) 169/77   Pulse 77   Temp 98.5 F (36.9 C) (Oral)   Ht 5' 6" (1.676 m)   Wt 176 lb 6.4 oz (80 kg)   SpO2 98%   BMI 28.47 kg/m   Wt Readings from Last 3 Encounters:  05/06/18 176 lb 6.4 oz (80 kg)  03/18/18 178 lb 6.4 oz (80.9 kg)  02/15/18 172 lb 3.2 oz (78.1 kg)    Physical Exam  General appearance - alert, well appearing, and in no distress Mental status - normal mood, behavior, speech, dress,  motor activity, and thought processes Chest - clear to auscultation, no wheezes, rales or rhonchi, symmetric air entry Heart - normal rate, regular rhythm, normal S1, S2, no murmurs, rubs, clicks or gallops Extremities - peripheral pulses normal, no pedal edema, no clubbing or cyanosis  Results for orders placed or performed in visit on 05/06/18  POCT glucose (manual entry)  Result Value Ref Range   POC   Glucose 130 (A) 70 - 99 mg/dl  POCT glycosylated hemoglobin (Hb A1C)  Result Value Ref Range   Hemoglobin A1C     HbA1c POC (<> result, manual entry)     HbA1c, POC (prediabetic range)     HbA1c, POC (controlled diabetic range) 7.6 (A) 0.0 - 7.0 %    ASSESSMENT AND PLAN: 1. Essential hypertension Not at goal.  Increase carvedilol to 25 mg 1-1/2 tablet twice a day.  Continue other blood pressure medications. - carvedilol (COREG) 25 MG tablet; Take 1.5 tablets (37.5 mg total) by mouth 2 (two) times daily with a meal.  Dispense: 90 tablet; Refill: 3  2. Controlled type 2 diabetes mellitus with complication, with long-term current use of insulin (HCC) Reported blood sugars close to goal.  He will continue current dose of Lantus. - POCT glucose (manual entry) - POCT glycosylated hemoglobin (Hb A1C)  3. CKD (chronic kidney disease) stage 3, GFR 30-59 ml/min (HCC) Stressed importance of good blood pressure and diabetes control.  4. Influenza vaccination declined   Patient was given the opportunity to ask questions.  Patient verbalized understanding of the plan and was able to repeat key elements of the plan.   Orders Placed This Encounter  Procedures  . POCT glucose (manual entry)  . POCT glycosylated hemoglobin (Hb A1C)     Requested Prescriptions    No prescriptions requested or ordered in this encounter    No follow-ups on file.  Deborah Johnson, MD, FACP 

## 2018-05-09 MED FILL — LISINOPRIL 20 MG TAB: 20 | 90 days supply | Qty: 90 | Fill #0

## 2018-05-24 ENCOUNTER — Other Ambulatory Visit: Payer: Self-pay | Admitting: Internal Medicine

## 2018-05-24 DIAGNOSIS — I10 Essential (primary) hypertension: Secondary | ICD-10-CM

## 2018-05-24 MED FILL — BIDIL TABLET: 20-37.5 | 30 days supply | Qty: 120 | Fill #4

## 2018-05-24 MED FILL — FUROSEMIDE 20 MG TABLET: 20 | 30 days supply | Qty: 30 | Fill #0

## 2018-05-24 MED FILL — LANTUS SOLOSTAR 100 UNITS/M: 100 | 30 days supply | Qty: 9 | Fill #4

## 2018-06-01 MED FILL — ATORVASTATIN 40 MG TABLET: 40 | 90 days supply | Qty: 90 | Fill #1

## 2018-06-24 MED FILL — !BIDIL TABLET: 20-37.5MG | 30 days supply | Qty: 120 | Fill #5

## 2018-07-01 MED FILL — FUROSEMIDE 20 MG TABLET: 20 | 30 days supply | Qty: 30 | Fill #1

## 2018-07-01 MED FILL — LANTUS SOLOSTAR 100 UNITS/M: 100 | 30 days supply | Qty: 9 | Fill #5

## 2018-07-08 MED FILL — CARVEDILOL 25 MG TABLET: 25 | 30 days supply | Qty: 90 | Fill #0

## 2018-07-25 ENCOUNTER — Other Ambulatory Visit: Payer: Self-pay | Admitting: Internal Medicine

## 2018-07-25 DIAGNOSIS — I2510 Atherosclerotic heart disease of native coronary artery without angina pectoris: Secondary | ICD-10-CM

## 2018-07-29 MED FILL — !BIDIL TABLET: 20-37.5MG | 30 days supply | Qty: 120 | Fill #0

## 2018-08-04 MED FILL — FUROSEMIDE 20 MG TABLET: 20 | 30 days supply | Qty: 30 | Fill #2

## 2018-08-04 MED FILL — LISINOPRIL 20 MG TAB: 20 | 90 days supply | Qty: 90 | Fill #1

## 2018-08-04 MED FILL — LANTUS SOLOSTAR 100 UNITS/M: 100 | 20 days supply | Qty: 6 | Fill #6

## 2018-08-08 ENCOUNTER — Ambulatory Visit: Payer: Medicare Other | Attending: Internal Medicine | Admitting: Internal Medicine

## 2018-08-08 ENCOUNTER — Encounter: Payer: Self-pay | Admitting: Internal Medicine

## 2018-08-08 VITALS — BP 197/96 | HR 84 | Temp 98.6°F | Resp 16 | Wt 173.2 lb

## 2018-08-08 DIAGNOSIS — N183 Chronic kidney disease, stage 3 unspecified: Secondary | ICD-10-CM

## 2018-08-08 DIAGNOSIS — Z79899 Other long term (current) drug therapy: Secondary | ICD-10-CM | POA: Insufficient documentation

## 2018-08-08 DIAGNOSIS — I2510 Atherosclerotic heart disease of native coronary artery without angina pectoris: Secondary | ICD-10-CM | POA: Diagnosis not present

## 2018-08-08 DIAGNOSIS — E1165 Type 2 diabetes mellitus with hyperglycemia: Secondary | ICD-10-CM | POA: Diagnosis not present

## 2018-08-08 DIAGNOSIS — Z87891 Personal history of nicotine dependence: Secondary | ICD-10-CM | POA: Insufficient documentation

## 2018-08-08 DIAGNOSIS — R569 Unspecified convulsions: Secondary | ICD-10-CM | POA: Insufficient documentation

## 2018-08-08 DIAGNOSIS — Z8673 Personal history of transient ischemic attack (TIA), and cerebral infarction without residual deficits: Secondary | ICD-10-CM | POA: Insufficient documentation

## 2018-08-08 DIAGNOSIS — I10 Essential (primary) hypertension: Secondary | ICD-10-CM

## 2018-08-08 DIAGNOSIS — IMO0002 Reserved for concepts with insufficient information to code with codable children: Secondary | ICD-10-CM

## 2018-08-08 DIAGNOSIS — E1122 Type 2 diabetes mellitus with diabetic chronic kidney disease: Secondary | ICD-10-CM | POA: Insufficient documentation

## 2018-08-08 DIAGNOSIS — I5042 Chronic combined systolic (congestive) and diastolic (congestive) heart failure: Secondary | ICD-10-CM | POA: Insufficient documentation

## 2018-08-08 DIAGNOSIS — Z7982 Long term (current) use of aspirin: Secondary | ICD-10-CM | POA: Insufficient documentation

## 2018-08-08 DIAGNOSIS — Z794 Long term (current) use of insulin: Secondary | ICD-10-CM

## 2018-08-08 DIAGNOSIS — E11319 Type 2 diabetes mellitus with unspecified diabetic retinopathy without macular edema: Secondary | ICD-10-CM | POA: Insufficient documentation

## 2018-08-08 DIAGNOSIS — E118 Type 2 diabetes mellitus with unspecified complications: Secondary | ICD-10-CM

## 2018-08-08 DIAGNOSIS — I480 Paroxysmal atrial fibrillation: Secondary | ICD-10-CM | POA: Insufficient documentation

## 2018-08-08 DIAGNOSIS — I130 Hypertensive heart and chronic kidney disease with heart failure and stage 1 through stage 4 chronic kidney disease, or unspecified chronic kidney disease: Secondary | ICD-10-CM | POA: Insufficient documentation

## 2018-08-08 DIAGNOSIS — Z955 Presence of coronary angioplasty implant and graft: Secondary | ICD-10-CM | POA: Insufficient documentation

## 2018-08-08 MED ORDER — INSULIN GLARGINE 100 UNIT/ML SOLOSTAR PEN: 26.0000 [IU] | PEN_INJECTOR | Freq: Every day | SUBCUTANEOUS | 3 refills | Status: DC

## 2018-08-08 MED ORDER — ISOSORB DINITRATE-HYDRALAZINE 20-37.5 MG PO TABS: 2.0000 | ORAL_TABLET | Freq: Two times a day (BID) | ORAL | 3 refills | Status: DC

## 2018-08-08 MED ORDER — LISINOPRIL 20 MG PO TABS: 20.0000 mg | ORAL_TABLET | Freq: Every day | ORAL | 1 refills | Status: DC

## 2018-08-08 MED ORDER — CARVEDILOL 25 MG PO TABS: 50.0000 mg | ORAL_TABLET | Freq: Two times a day (BID) | ORAL | 5 refills | Status: DC

## 2018-08-08 NOTE — Progress Notes (Signed)
Patient ID: Timothy Lambert, male    DOB: 08-24-1956  MRN: [id number]  CC: Diabetes and Hypertension   Subjective: Timothy Lambert is a 62 y.o. male who presents for chronic disease management His concerns today include: Pt with hx of DM, HTN, HL, CAD with DES LAD 2016,embolicCVA, PAF (thought to be a poor candidate for anticoagulation because of history of EtOH abuse at the time of his CVA in 2016) ETOH withdrawal sz, CKD stage 3, macroalbuminuria  HTN/CAD:  Was late in taking meds this morning Ate pork chops last night No CP/SOB/LE edema/headache/dizziness His BP device not working.  He plans to purchase a new device  DM:  Had to take Lantus QOD for 3 days because he was running low.  He plans to pick up refill today..   Not checking BS consistently.   "I eat a little sweet every other day like swiss roll Never had low BS Due for eye exam.  Plans to schedule himself   Patient Active Problem List   Diagnosis Date Noted  . CKD (chronic kidney disease) stage 3, GFR 30-59 ml/min (HCC) 02/15/2018  . Macroalbuminuric diabetic nephropathy ([place]) 02/15/2018  . Diabetic retinopathy ([place]) 11/18/2015  . Paroxysmal atrial fibrillation ([employer]) 10/28/2015  . Cognitive deficit, post-stroke 05/13/2015  . Essential hypertension 02/19/2015  . CAD (coronary artery disease) 02/19/2015  . 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 CVA (cerebral vascular accident) ([place])   . Combined systolic and diastolic congestive heart failure ([place])   . Diabetes type 2, uncontrolled ([place])   . Seizure [facility])      Current Outpatient Medications on File Prior to Visit  Medication Sig Dispense Refill  . aspirin EC 81 MG tablet Take 1 tablet (81 mg total) by mouth daily. 100 tablet 1  . atorvastatin (LIPITOR) 40 MG tablet Take 1 tablet (40 mg total) by mouth daily at 6 PM. 90 tablet 1  . Blood Glucose Monitoring Suppl (TRUE METRIX METER) w/Device KIT Use as directed 1 kit 0  . furosemide (LASIX) 20 MG  tablet TAKE 1 TABLET BY MOUTH DAILY. 30 tablet 2  . glucose blood (TRUE METRIX BLOOD GLUCOSE TEST) test strip Use as instructed 100 each 12  . Insulin Pen Needle (TRUEPLUS PEN NEEDLES) 32G X 4 MM MISC USE TO INJECT INSULIN AS DIRECTED 100 each 5  . Insulin Syringe-Needle U-100 (INSULIN SYRINGE .5CC/30GX1/2") 30G X 1/2" 0.5 ML MISC Check blood sugar TID & QHS 100 each 2  . Lancets (ACCU-CHEK SOFT TOUCH) lancets Use as instructed 100 each 12  . TRUEPLUS LANCETS 28G MISC 1 each by Does not apply route 3 (three) times daily before meals. 100 each 12   No current facility-administered medications on file prior to visit.     No Known Allergies  Social History   Socioeconomic History  . Marital status: Married    Spouse name: Not on file  . Number of children: Not on file  . Years of education: Not on file  . Highest education level: Not on file  Occupational History  . Not on file  Social Needs  . Financial resource strain: Not on file  . Food insecurity:    Worry: Not on file    Inability: Not on file  . Transportation needs:    Medical: Not on file    Non-medical: Not on file  Tobacco Use  . Smoking status: Former Smoker    Packs/day: 0.33    Years: 14.00    Pack  years: 4.62    Types: Cigarettes  . Smokeless tobacco: Never Used  . Tobacco comment: "quit smoking cigarettes in 1992"  Substance and Sexual Activity  . Alcohol use: No    Comment: 02/19/2015 "stopped drinking 01/22/2015"  . Drug use: No  . Sexual activity: Not Currently  Lifestyle  . Physical activity:    Days per week: Not on file    Minutes per session: Not on file  . Stress: Not on file  Relationships  . Social connections:    Talks on phone: Not on file    Gets together: Not on file    Attends religious service: Not on file    Active member of club or organization: Not on file    Attends meetings of clubs or organizations: Not on file    Relationship status: Not on file  . Intimate partner violence:     Fear of current or ex partner: Not on file    Emotionally abused: Not on file    Physically abused: Not on file    Forced sexual activity: Not on file  Other Topics Concern  . Not on file  Social History Narrative   Lives   Caffeine use: Coffee sometimes    Family History  Problem Relation Age of Onset  . Diabetes Father   . Seizures Neg Hx     Past Surgical History:  Procedure Laterality Date  . CARDIAC CATHETERIZATION N/A 01/23/2015   Procedure: Left Heart Cath and Coronary Angiography;  Surgeon: [name], MD;  Location: [facility] INVASIVE CV LAB CUPID;  Service: Cardiovascular;  Laterality: N/A;  . CARDIAC CATHETERIZATION N/A 01/23/2015   Procedure: Coronary Stent Intervention;  Surgeon: [name], MD;  Location: [facility] INVASIVE CV LAB CUPID;  Service: Cardiovascular;  Laterality: N/A;    ROS: Review of Systems Negative except as above PHYSICAL EXAM: BP (!) 197/96 (BP Location: Right Arm, Cuff Size: Normal) Comment: recheck  Pulse 84   Temp 98.6 F (37 C) (Oral)   Resp 16   Wt 173 lb 3.2 oz (78.6 kg)   SpO2 97%   BMI 27.96 kg/m    BP 199/101 Physical Exam General appearance - alert, well appearing, and in no distress Mental status - normal mood, behavior, speech, dress, motor activity, and thought processes Neck - supple, no significant adenopathy Chest - clear to auscultation, no wheezes, rales or rhonchi, symmetric air entry Heart - normal rate, regular rhythm, normal S1, S2, no murmurs, rubs, clicks or gallops Extremities - peripheral pulses normal, no pedal edema, no clubbing or cyanosis  Results for orders placed or performed in visit on 08/08/18  POCT glucose (manual entry)  Result Value Ref Range   POC Glucose 167 (A) 70 - 99 mg/dl  POCT glycosylated hemoglobin (Hb A1C)  Result Value Ref Range   Hemoglobin A1C     HbA1c POC (<> result, manual entry)     HbA1c, POC (prediabetic range)     HbA1c, POC (controlled diabetic range) 8.6 (A) 0.0 - 7.0 %     ASSESSMENT AND PLAN:  1. Essential hypertension DASH diet discussed and encouraged.  Increase carvedilol to 50 mg twice a day.  If he does get blood pressure monitoring device, I recommend that he check his blood pressure at least 2-3 times a week.  Recheck him in 1 month - carvedilol (COREG) 25 MG tablet; Take 2 tablets (50 mg total) by mouth 2 (two) times daily with a meal.  Dispense: 120 tablet; Refill: 5 -  isosorbide-hydrALAZINE (BIDIL) 20-37.5 MG tablet; Take 2 tablets by mouth 2 (two) times daily.  Dispense: 120 tablet; Refill: 3 - lisinopril (PRINIVIL,ZESTRIL) 20 MG tablet; Take 1 tablet (20 mg total) by mouth daily.  Dispense: 90 tablet; Refill: 1  2. CKD (chronic kidney disease) stage 3, GFR 30-59 ml/min (HCC) Followed by nephrology.  3. Uncontrolled diabetes mellitus with complication, with long-term current use of insulin ([place]) Advised patient to check blood sugars at least once a day sometimes before breakfast and other times before dinner.  I would like for him to bring in those readings on his next visit - POCT glucose (manual entry) - POCT glycosylated hemoglobin (Hb A1C) - Insulin Glargine (LANTUS SOLOSTAR) 100 UNIT/ML Solostar Pen; Inject 26 Units into the skin daily at 10 pm.  Dispense: 15 mL; Refill: 3  4. Coronary artery disease involving native coronary artery of native heart without angina pectoris Clinically stable - isosorbide-hydrALAZINE (BIDIL) 20-37.5 MG tablet; Take 2 tablets by mouth 2 (two) times daily.  Dispense: 120 tablet; Refill: 3   Patient was given the opportunity to ask questions.  Patient verbalized understanding of the plan and was able to repeat key elements of the plan.   Orders Placed This Encounter  Procedures  . POCT glucose (manual entry)  . POCT glycosylated hemoglobin (Hb A1C)     Requested Prescriptions   Signed Prescriptions Disp Refills  . carvedilol (COREG) 25 MG tablet 120 tablet 5    Sig: Take 2 tablets (50 mg total) by  mouth 2 (two) times daily with a meal.  . Insulin Glargine (LANTUS SOLOSTAR) 100 UNIT/ML Solostar Pen 15 mL 3    Sig: Inject 26 Units into the skin daily at 10 pm.  . isosorbide-hydrALAZINE (BIDIL) 20-37.5 MG tablet 120 tablet 3    Sig: Take 2 tablets by mouth 2 (two) times daily.  Marland Kitchen lisinopril (PRINIVIL,ZESTRIL) 20 MG tablet 90 tablet 1    Sig: Take 1 tablet (20 mg total) by mouth daily.    Return in about 4 months (around 12/07/2018).  Karle Plumber, MD, FACP

## 2018-08-08 NOTE — Patient Instructions (Signed)
Your blood pressure is not controlled.  Increase carvedilol to 50 mg twice a day.  Please check your blood sugars at least once a day before breakfast and sometimes before dinner.  Bring in those readings on your next visit.

## 2018-08-08 NOTE — Progress Notes (Signed)
[id number] a1c-  8.6

## 2018-08-09 LAB — POCT GLYCOSYLATED HEMOGLOBIN (HGB A1C): HbA1c, POC (controlled diabetic range): 8.6 % — AB (ref 0.0–7.0)

## 2018-08-09 LAB — GLUCOSE, POCT (MANUAL RESULT ENTRY): POC GLUCOSE: 167 mg/dL — AB (ref 70–99)

## 2018-08-29 MED FILL — LANTUS SOLOSTAR 100 UNITS/M: 100 | 23 days supply | Qty: 6 | Fill #0

## 2018-08-31 MED FILL — !BIDIL TABLET: 20-37.5MG | 30 days supply | Qty: 120 | Fill #0

## 2018-08-31 MED FILL — TRUEPLUS PEN NDL 32GX5/32: 32G X 4 MM | 30 days supply | Qty: 100 | Fill #2

## 2018-08-31 MED FILL — TRUEPLUS PEN NDL 32GX5/32": 32G X 4 MM | 30 days supply | Qty: 100 | Fill #2

## 2018-09-05 ENCOUNTER — Ambulatory Visit: Payer: Medicare Other | Admitting: Internal Medicine

## 2018-09-19 ENCOUNTER — Other Ambulatory Visit: Payer: Self-pay | Admitting: Internal Medicine

## 2018-09-19 DIAGNOSIS — I10 Essential (primary) hypertension: Secondary | ICD-10-CM

## 2018-09-19 MED FILL — FUROSEMIDE 20 MG TABLET: 20 | 30 days supply | Qty: 30 | Fill #0

## 2018-09-19 MED FILL — CARVEDILOL 25 MG TABLET: 25 | 90 days supply | Qty: 270 | Fill #1

## 2018-09-23 MED FILL — LANTUS SOLOSTAR 100 UNITS/M: 100 | 23 days supply | Qty: 6 | Fill #1

## 2018-10-04 ENCOUNTER — Other Ambulatory Visit: Payer: Self-pay | Admitting: Internal Medicine

## 2018-10-04 DIAGNOSIS — I2510 Atherosclerotic heart disease of native coronary artery without angina pectoris: Secondary | ICD-10-CM

## 2018-10-04 MED FILL — !BIDIL TABLET: 20-37.5MG | 30 days supply | Qty: 120 | Fill #1

## 2018-10-05 MED FILL — ATORVASTATIN CALCIUM 40 MG: 40 | 90 days supply | Qty: 90 | Fill #0

## 2018-10-25 MED FILL — FUROSEMIDE 20 MG TABLET: 20 | 30 days supply | Qty: 30 | Fill #1

## 2018-10-25 MED FILL — LANTUS SOLOSTAR 100 UNITS/M: 100 | 23 days supply | Qty: 6 | Fill #2

## 2018-11-15 MED FILL — LISINOPRIL 20 MG TAB: 20 | 30 days supply | Qty: 30 | Fill #0

## 2018-11-15 MED FILL — !BIDIL TABLET: 20-37.5MG | 30 days supply | Qty: 120 | Fill #2

## 2018-11-22 MED FILL — LANTUS SOLOSTAR 100 UNITS/M: 100 | 23 days supply | Qty: 6 | Fill #3

## 2018-12-09 MED FILL — FUROSEMIDE 20 MG TABLET: 20 | 30 days supply | Qty: 30 | Fill #2

## 2018-12-16 MED FILL — LANTUS SOLOSTAR 100 UNITS/M: 100 | 23 days supply | Qty: 6 | Fill #4

## 2019-01-02 ENCOUNTER — Other Ambulatory Visit: Payer: Self-pay | Admitting: Internal Medicine

## 2019-01-02 DIAGNOSIS — E1165 Type 2 diabetes mellitus with hyperglycemia: Secondary | ICD-10-CM

## 2019-01-02 MED FILL — TRUEPLUS PEN NDL 32GX5/32": 32G X 4 MM | 30 days supply | Qty: 100 | Fill #0

## 2019-01-02 MED FILL — TRUEPLUS PEN NDL 32GX5/32: 32G X 4 MM | 30 days supply | Qty: 100 | Fill #0

## 2019-01-02 MED FILL — LISINOPRIL 20 MG TAB: 20 | 90 days supply | Qty: 90 | Fill #1

## 2019-01-02 MED FILL — BIDIL TABLET: 20-37.5 | 30 days supply | Qty: 120 | Fill #3

## 2019-02-01 ENCOUNTER — Other Ambulatory Visit: Payer: Self-pay | Admitting: Internal Medicine

## 2019-02-01 DIAGNOSIS — I10 Essential (primary) hypertension: Secondary | ICD-10-CM

## 2019-02-01 MED FILL — LANTUS SOLOSTAR 100 UNITS/M: 100 | 23 days supply | Qty: 6 | Fill #5

## 2019-02-01 MED FILL — FUROSEMIDE 20 MG TABS: 20 | 30 days supply | Qty: 30 | Fill #0

## 2019-02-22 ENCOUNTER — Other Ambulatory Visit: Payer: Self-pay | Admitting: Internal Medicine

## 2019-02-22 DIAGNOSIS — I2510 Atherosclerotic heart disease of native coronary artery without angina pectoris: Secondary | ICD-10-CM

## 2019-02-22 DIAGNOSIS — I10 Essential (primary) hypertension: Secondary | ICD-10-CM

## 2019-02-22 MED FILL — LISINOPRIL 20 MG TAB: 20 | 60 days supply | Qty: 60 | Fill #2

## 2019-02-22 MED FILL — CARVEDILOL 25 MG TABLET: 25 | 30 days supply | Qty: 120 | Fill #0

## 2019-03-03 MED FILL — LANTUS SOLOSTAR 100 UNITS/M: 100 | 23 days supply | Qty: 6 | Fill #6

## 2019-03-13 ENCOUNTER — Encounter: Payer: Self-pay | Admitting: Internal Medicine

## 2019-03-13 ENCOUNTER — Ambulatory Visit: Payer: Medicare Other | Attending: Internal Medicine | Admitting: Internal Medicine

## 2019-03-13 ENCOUNTER — Other Ambulatory Visit: Payer: Self-pay

## 2019-03-13 VITALS — BP 230/110 | HR 80 | Temp 98.8°F | Resp 18 | Ht 66.0 in | Wt 171.0 lb

## 2019-03-13 DIAGNOSIS — E1165 Type 2 diabetes mellitus with hyperglycemia: Secondary | ICD-10-CM | POA: Diagnosis not present

## 2019-03-13 DIAGNOSIS — E1121 Type 2 diabetes mellitus with diabetic nephropathy: Secondary | ICD-10-CM | POA: Diagnosis not present

## 2019-03-13 DIAGNOSIS — Z794 Long term (current) use of insulin: Secondary | ICD-10-CM | POA: Diagnosis not present

## 2019-03-13 DIAGNOSIS — E118 Type 2 diabetes mellitus with unspecified complications: Secondary | ICD-10-CM | POA: Diagnosis not present

## 2019-03-13 DIAGNOSIS — Z1211 Encounter for screening for malignant neoplasm of colon: Secondary | ICD-10-CM

## 2019-03-13 DIAGNOSIS — I10 Essential (primary) hypertension: Secondary | ICD-10-CM

## 2019-03-13 DIAGNOSIS — I480 Paroxysmal atrial fibrillation: Secondary | ICD-10-CM

## 2019-03-13 DIAGNOSIS — IMO0002 Reserved for concepts with insufficient information to code with codable children: Secondary | ICD-10-CM

## 2019-03-13 DIAGNOSIS — I5022 Chronic systolic (congestive) heart failure: Secondary | ICD-10-CM

## 2019-03-13 DIAGNOSIS — E663 Overweight: Secondary | ICD-10-CM

## 2019-03-13 DIAGNOSIS — I2510 Atherosclerotic heart disease of native coronary artery without angina pectoris: Secondary | ICD-10-CM

## 2019-03-13 LAB — POCT GLYCOSYLATED HEMOGLOBIN (HGB A1C): Hemoglobin A1C: 11.2 % — AB (ref 4.0–5.6)

## 2019-03-13 LAB — GLUCOSE, POCT (MANUAL RESULT ENTRY): POC Glucose: 258 mg/dl — AB (ref 70–99)

## 2019-03-13 MED ORDER — ATORVASTATIN CALCIUM 40 MG PO TABS: 40.0000 mg | ORAL_TABLET | Freq: Every day | ORAL | 3 refills | Status: DC

## 2019-03-13 MED ORDER — ISOSORB DINITRATE-HYDRALAZINE 20-37.5 MG PO TABS: 2.0000 | ORAL_TABLET | Freq: Two times a day (BID) | ORAL | 6 refills | Status: DC

## 2019-03-13 MED ORDER — LANTUS SOLOSTAR 100 UNIT/ML ~~LOC~~ SOPN: 28.0000 [IU] | PEN_INJECTOR | Freq: Every day | SUBCUTANEOUS | 3 refills | Status: DC

## 2019-03-13 MED ORDER — FUROSEMIDE 20 MG PO TABS: 20.0000 mg | ORAL_TABLET | Freq: Every day | ORAL | 6 refills | Status: DC

## 2019-03-13 MED FILL — ATORVASTATIN CALCIUM 40 MG: 40 | 30 days supply | Qty: 30 | Fill #0

## 2019-03-13 MED FILL — BIDIL TABLET: 20-37.5 | 30 days supply | Qty: 120 | Fill #0

## 2019-03-13 MED FILL — FUROSEMIDE 20 MG TABS: 20 | 30 days supply | Qty: 30 | Fill #0

## 2019-03-13 NOTE — Progress Notes (Signed)
Patient ID: OMAIR DETTMER, male    DOB: 03-Dec-1955  MRN: [id number]  CC: Hypertension   Subjective: Timothy Lambert is a 63 y.o. male who presents for chronic ds management His concerns today include:  Pt with hx of DM, HTN, HL, CAD with DES LAD 2016,embolicCVA, PAF (thought to be a poor candidate for anticoagulation because of history of EtOH abuse at the time of his CVA in 2016) ETOH withdrawal sz, CKD stage 3, macroalbuminuria  Patient last seen in November.  He states that he did not keep his follow-up appointment in the new year because he felt his numbers would have been off coming out of the holidays.    HYPERTENSION/CAD/PAF/Hx sys CHF Currently taking: see medication list.blood pressure noted to be significantly elevated today.   Med Adherence: [x]  Yes - out of Bidil x 3 days Medication side effects: []  Yes    [x]  No Adherence with salt restriction: [x]  Yes    []  No Home Monitoring?: [x]  Yes    []  No Monitoring Frequency:  Once a wk Home BP results range: does not keep a log.  Thinks range has been 160/80 SOB? []  Yes    [x]  No Chest Pain?: []  Yes    [x]  No Leg swelling?: []  Yes    [x]  No Headaches?: []  Yes    [x]  No Dizziness? []  Yes    [x]  No Comments:   DIABETES TYPE 2 Last A1C:   Results for orders placed or performed in visit on 03/13/19  HgB A1c  Result Value Ref Range   Hemoglobin A1C 11.2 (A) 4.0 - 5.6 %   HbA1c POC (<> result, manual entry)     HbA1c, POC (prediabetic range)     HbA1c, POC (controlled diabetic range)    Glucose (CBG)  Result Value Ref Range   POC Glucose 258 (A) 70 - 99 mg/dl    Med Adherence:  [x]  Yes , Lantus 26 units Medication side effects:  []  Yes    [x]  No Home Monitoring?  [x]  Yes but not often.  He brings a log today with blood sugars that he checked twice a day every other day for 4 days.  Morning blood sugar range before breakfast has been in the 150s and before dinner 160-170 Home glucose results range: Diet Adherence:  [x]  Yes    []  No Exercise: [x]  Yes but not much.  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   []  No Hypoglycemic episodes?: []  Yes    [x]  No Numbness of the feet? []  Yes    [x]  No Retinopathy hx? []  Yes    []  No Last eye exam: He is overdue for his eye exam. Comments:  CKD: reportedly saw neph 10/2018.  Told to f/u in 6 mths.  No changes made in medications.  He is not on any NSAIDs  HM: Overdue for colon cancer screening.  Given a fit test last year which he is not turned in.  He also states that a nurse from his insurance comes to his house once a year and also gave him a packet but he has not turned that in either he plans to do so.    Patient Active Problem List   Diagnosis Date Noted  . CKD (chronic kidney disease) stage 3, GFR 30-59 ml/min (HCC) 02/15/2018  . Macroalbuminuric diabetic nephropathy ([place]) 02/15/2018  . Diabetic retinopathy ([place]) 11/18/2015  . Paroxysmal atrial fibrillation ([place]) 10/28/2015  . Cognitive deficit,  post-stroke 05/13/2015  . Essential hypertension 02/19/2015  . CAD (coronary artery disease) 02/19/2015  . 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 CVA (cerebral vascular accident) ([place])   . Combined systolic and diastolic congestive heart failure ([place])   . Diabetes type 2, uncontrolled ([place])   . Seizure [facility])      Current Outpatient Medications on File Prior to Visit  Medication Sig Dispense Refill  . aspirin EC 81 MG tablet Take 1 tablet (81 mg total) by mouth daily. 100 tablet 1  . atorvastatin (LIPITOR) 40 MG tablet TAKE 1 TABLET (40 MG TOTAL) BY MOUTH DAILY AT 6 PM. 90 tablet 0  . Blood Glucose Monitoring Suppl (TRUE METRIX METER) w/Device KIT Use as directed 1 kit 0  . carvedilol (COREG) 25 MG tablet Take 2 tablets (50 mg total) by mouth 2 (two) times daily with a meal. 120 tablet 5  . furosemide (LASIX) 20 MG tablet Take 1 tablet (20 mg total) by mouth daily. Needs appt for more refills. 30 tablet 0  . glucose blood (TRUE  METRIX BLOOD GLUCOSE TEST) test strip Use as instructed 100 each 12  . Insulin Glargine (LANTUS SOLOSTAR) 100 UNIT/ML Solostar Pen Inject 26 Units into the skin daily at 10 pm. 15 mL 3  . Insulin Syringe-Needle U-100 (INSULIN SYRINGE .5CC/30GX1/2") 30G X 1/2" 0.5 ML MISC Check blood sugar TID & QHS 100 each 2  . isosorbide-hydrALAZINE (BIDIL) 20-37.5 MG tablet Take 2 tablets by mouth 2 (two) times daily. 120 tablet 3  . Lancets (ACCU-CHEK SOFT TOUCH) lancets Use as instructed 100 each 12  . lisinopril (PRINIVIL,ZESTRIL) 20 MG tablet Take 1 tablet (20 mg total) by mouth daily. 90 tablet 1  . TRUEPLUS LANCETS 28G MISC 1 each by Does not apply route 3 (three) times daily before meals. 100 each 12  . TRUEPLUS PEN NEEDLES 32G X 4 MM MISC USE TO INJECT INSULIN AS DIRECTED 100 each 5   No current facility-administered medications on file prior to visit.     No Known Allergies  Social History   Socioeconomic History  . Marital status: Married    Spouse name: Not on file  . Number of children: Not on file  . Years of education: Not on file  . Highest education level: Not on file  Occupational History  . Not on file  Social Needs  . Financial resource strain: Not on file  . Food insecurity    Worry: Not on file    Inability: Not on file  . Transportation needs    Medical: Not on file    Non-medical: Not on file  Tobacco Use  . Smoking status: Former Smoker    Packs/day: 0.33    Years: 14.00    Pack years: 4.62    Types: Cigarettes  . Smokeless tobacco: Never Used  . Tobacco comment: "quit smoking cigarettes in 1992"  Substance and Sexual Activity  . Alcohol use: No    Comment: 02/19/2015 "stopped drinking 01/22/2015"  . Drug use: No  . Sexual activity: Not Currently  Lifestyle  . Physical activity    Days per week: Not on file    Minutes per session: Not on file  . Stress: Not on file  Relationships  . Social Herbalist on phone: Not on file    Gets together: Not on  file    Attends religious service: Not on file    Active member of club or organization: Not on file  Attends meetings of clubs or organizations: Not on file    Relationship status: Not on file  . Intimate partner violence    Fear of current or ex partner: Not on file    Emotionally abused: Not on file    Physically abused: Not on file    Forced sexual activity: Not on file  Other Topics Concern  . Not on file  Social History Narrative   Lives   Caffeine use: Coffee sometimes    Family History  Problem Relation Age of Onset  . Diabetes Father   . Seizures Neg Hx     Past Surgical History:  Procedure Laterality Date  . CARDIAC CATHETERIZATION N/A 01/23/2015   Procedure: Left Heart Cath and Coronary Angiography;  Surgeon: [name], MD;  Location: [facility] INVASIVE CV LAB CUPID;  Service: Cardiovascular;  Laterality: N/A;  . CARDIAC CATHETERIZATION N/A 01/23/2015   Procedure: Coronary Stent Intervention;  Surgeon: [name], MD;  Location: [facility] INVASIVE CV LAB CUPID;  Service: Cardiovascular;  Laterality: N/A;    ROS: Review of Systems Negative except as stated above  PHYSICAL EXAM: BP (!) 219/98 (BP Location: Left Arm, Patient Position: Sitting, Cuff Size: Normal)   Pulse 80   Temp 98.8 F (37.1 C) (Oral)   Resp 18   Ht 5' 6"  (1.676 m)   Wt 171 lb (77.6 kg)   SpO2 98%   BMI 27.60 kg/m   Physical Exam BP 230/110  General appearance - alert, well appearing, and in no distress Mental status - normal mood, behavior, speech, dress, motor activity, and thought processes Mouth - mucous membranes moist, pharynx normal without lesions Neck - supple, no significant adenopathy Chest - clear to auscultation, no wheezes, rales or rhonchi, symmetric air entry Heart - normal rate, regular rhythm, normal S1, S2, no murmurs, rubs, clicks or gallops Extremities - peripheral pulses normal, no pedal edema, no clubbing or cyanosis Diabetic Foot Exam - Simple   Simple Foot Form  Visual Inspection No deformities, no ulcerations, no other skin breakdown bilaterally: Yes Sensation Testing Intact to touch and monofilament testing bilaterally: Yes Pulse Check Posterior Tibialis and Dorsalis pulse intact bilaterally: Yes Comments     CMP Latest Ref Rng & Units 12/10/2017 09/06/2015 06/19/2015  Glucose 65 - 99 mg/dL 111(H) 149(H) 154(H)  BUN 8 - 27 mg/dL 22 13 19   Creatinine 0.76 - 1.27 mg/dL 1.82(H) 1.19 1.27(H)  Sodium 134 - 144 mmol/L 144 140 142  Potassium 3.5 - 5.2 mmol/L 4.0 4.1 4.6  Chloride 96 - 106 mmol/L 107(H) 103 107  CO2 20 - 29 mmol/L 22 28 30   Calcium 8.6 - 10.2 mg/dL 8.5(L) 9.3 9.8  Total Protein 6.0 - 8.5 g/dL 6.1 - -  Total Bilirubin 0.0 - 1.2 mg/dL 0.2 - -  Alkaline Phos 39 - 117 IU/L 68 - -  AST 0 - 40 IU/L 18 - -  ALT 0 - 44 IU/L 22 - -   Lipid Panel     Component Value Date/Time   CHOL 179 12/10/2017 1451   TRIG 71 12/10/2017 1451   HDL 39 (L) 12/10/2017 1451   CHOLHDL 4.6 12/10/2017 1451   CHOLHDL 3.0 04/25/2015 1216   VLDL 17 04/25/2015 1216   LDLCALC 126 (H) 12/10/2017 1451    CBC    Component Value Date/Time   WBC 6.7 12/10/2017 1451   WBC 8.8 04/02/2015 1014   RBC 4.15 12/10/2017 1451   RBC 3.45 (L) 04/02/2015 1014   HGB 12.3 (L)  12/10/2017 1451   HCT 35.4 (L) 12/10/2017 1451   PLT 245 12/10/2017 1451   MCV 85 12/10/2017 1451   MCH 29.6 12/10/2017 1451   MCH 30.0 02/20/2015 0509   MCHC 34.7 12/10/2017 1451   MCHC 33.5 04/02/2015 1014   RDW 15.0 12/10/2017 1451   LYMPHSABS 1.7 02/19/2015 2333   MONOABS 0.5 02/19/2015 2333   EOSABS 0.6 02/19/2015 2333   BASOSABS 0.0 02/19/2015 2333    ASSESSMENT AND PLAN: 1. Uncontrolled diabetes mellitus with complication, with long-term current use of insulin (HCC) Patient's blood sugar readings do not match up with his A1c today.. We will check A1c via blood draw to make sure that the one done by finger prick was accurate.  Patient has chronic kidney disease with anemia and  this can also affect the accuracy of the A1c so we will draw a fructosamine level also.  For now I recommend increasing the Lantus to 28 units.  Will refer to endocrinologist to help with management -Dietary counseling given. -Encouraged him to get in some form of exercise at least 3 to 4 days a week for 30 minutes. - HgB A1c - Glucose (CBG) - Ambulatory referral to Endocrinology - Ambulatory referral to Ophthalmology - CBC - Comprehensive metabolic panel - Lipid panel - Fructosamine - Hemoglobin A1c - Insulin Glargine (LANTUS SOLOSTAR) 100 UNIT/ML Solostar Pen; Inject 28 Units into the skin daily at 10 pm.  Dispense: 15 mL; Refill: 3  2. Malignant hypertension Blood pressure extremely elevated today.  Patient is asymptomatic.  He has been out of BiDil for 3 days.  Refill sent to the pharmacy.  He will continue his other blood pressure medications that include lisinopril and carvedilol. To limit salt in the foods. Patient declined a dose of clonidine today - isosorbide-hydrALAZINE (BIDIL) 20-37.5 MG tablet; Take 2 tablets by mouth 2 (two) times daily.  Dispense: 120 tablet; Refill: 6 - furosemide (LASIX) 20 MG tablet; Take 1 tablet (20 mg total) by mouth daily. Needs appt for more refills.  Dispense: 30 tablet; Refill: 6  3. Macroalbuminuric diabetic nephropathy ([place]) Followed by nephrology.  I stressed the importance of better diabetes control and blood pressure control  4. Coronary artery disease involving native coronary artery of native heart without angina pectoris Clinically stable.  Continue carvedilol, Lipitor, and aspirin - isosorbide-hydrALAZINE (BIDIL) 20-37.5 MG tablet; Take 2 tablets by mouth 2 (two) times daily.  Dispense: 120 tablet; Refill: 6 - atorvastatin (LIPITOR) 40 MG tablet; Take 1 tablet (40 mg total) by mouth daily at 6 PM.  Dispense: 90 tablet; Refill: 3  5. Chronic systolic CHF (congestive heart failure) (HCC) Clinically compensated - furosemide (LASIX) 20 MG  tablet; Take 1 tablet (20 mg total) by mouth daily. Needs appt for more refills.  Dispense: 30 tablet; Refill: 6  6. Paroxysmal atrial fibrillation (HCC) Currently in sinus rhythm on auscultation.  He was deemed not a good candidate for long-term anticoagulation in the past at the time when he was drinking excessively.  7. Over weight See #1 above   Patient was given the opportunity to ask questions.  Patient verbalized understanding of the plan and was able to repeat key elements of the plan.   Orders Placed This Encounter  Procedures  . HgB A1c  . Glucose (CBG)     Requested Prescriptions    No prescriptions requested or ordered in this encounter    No follow-ups on file.  Karle Plumber, MD, FACP

## 2019-03-13 NOTE — Patient Instructions (Signed)
Try to check your blood sugars twice a day every day before meals.  The goal is to be 90-130. Try to get in some formal physical activity at least 3 to 4 days a week for 20 to 30 minutes. You have been referred to an endocrinologist to help with management of your diabetes.  Increase Lantus insulin to 28 units daily.  Please take your blood pressure medications every day as prescribed.  Please use an turn in your fit or Cologuard kit as soon as possible for colon cancer screening as discussed today.

## 2019-03-14 LAB — COMPREHENSIVE METABOLIC PANEL
ALT: 22 IU/L (ref 0–44)
AST: 17 IU/L (ref 0–40)
Albumin/Globulin Ratio: 2 (ref 1.2–2.2)
Albumin: 4.4 g/dL (ref 3.8–4.8)
Alkaline Phosphatase: 86 IU/L (ref 39–117)
BUN/Creatinine Ratio: 16 (ref 10–24)
BUN: 33 mg/dL — ABNORMAL HIGH (ref 8–27)
Bilirubin Total: 0.4 mg/dL (ref 0.0–1.2)
CO2: 21 mmol/L (ref 20–29)
Calcium: 9.6 mg/dL (ref 8.6–10.2)
Chloride: 104 mmol/L (ref 96–106)
Creatinine, Ser: 2.05 mg/dL — ABNORMAL HIGH (ref 0.76–1.27)
GFR calc Af Amer: 39 mL/min/{1.73_m2} — ABNORMAL LOW (ref >59)
GFR calc non Af Amer: 34 mL/min/{1.73_m2} — ABNORMAL LOW (ref >59)
Globulin, Total: 2.2 g/dL (ref 1.5–4.5)
Glucose: 242 mg/dL — ABNORMAL HIGH (ref 65–99)
Potassium: 4.7 mmol/L (ref 3.5–5.2)
Sodium: 139 mmol/L (ref 134–144)
Total Protein: 6.6 g/dL (ref 6.0–8.5)

## 2019-03-14 LAB — LIPID PANEL
Chol/HDL Ratio: 4.6 ratio (ref 0.0–5.0)
Cholesterol, Total: 174 mg/dL (ref 100–199)
HDL: 38 mg/dL — ABNORMAL LOW (ref >39)
LDL Calculated: 118 mg/dL — ABNORMAL HIGH (ref 0–99)
Triglycerides: 88 mg/dL (ref 0–149)
VLDL Cholesterol Cal: 18 mg/dL (ref 5–40)

## 2019-03-14 LAB — CBC
Hematocrit: 40.1 % (ref 37.5–51.0)
Hemoglobin: 13.8 g/dL (ref 13.0–17.7)
MCH: 30.6 pg (ref 26.6–33.0)
MCHC: 34.4 g/dL (ref 31.5–35.7)
MCV: 89 fL (ref 79–97)
Platelets: 194 10*3/uL (ref 150–450)
RBC: 4.51 x10E6/uL (ref 4.14–5.80)
RDW: 13.5 % (ref 11.6–15.4)
WBC: 6.8 10*3/uL (ref 3.4–10.8)

## 2019-03-14 LAB — HEMOGLOBIN A1C
Est. average glucose Bld gHb Est-mCnc: 269 mg/dL
Hgb A1c MFr Bld: 11 % — ABNORMAL HIGH (ref 4.8–5.6)

## 2019-03-14 LAB — FRUCTOSAMINE: Fructosamine: 577 umol/L — ABNORMAL HIGH (ref 0–285)

## 2019-03-15 ENCOUNTER — Other Ambulatory Visit: Payer: Self-pay | Admitting: Internal Medicine

## 2019-03-15 DIAGNOSIS — E1165 Type 2 diabetes mellitus with hyperglycemia: Secondary | ICD-10-CM

## 2019-03-15 DIAGNOSIS — IMO0002 Reserved for concepts with insufficient information to code with codable children: Secondary | ICD-10-CM

## 2019-03-15 MED ORDER — TRULICITY 0.75 MG/0.5ML ~~LOC~~ SOAJ: 0.7500 mg | SUBCUTANEOUS | 3 refills | Status: DC

## 2019-03-15 MED FILL — TRULICITY 0.75 MG/0.5 ML PE: 0.75 | 28 days supply | Qty: 2 | Fill #0

## 2019-03-16 ENCOUNTER — Telehealth: Payer: Self-pay | Admitting: *Deleted

## 2019-03-16 NOTE — Telephone Encounter (Signed)
Patient verified DOB Patients wife is aware of patient being scheduled for CPP appointment on 4/94/4967 at 5:91 for trulicity teaching. Patients wife is also aware of kidney function being a little worse than 1 year ago and needing to get better control of his DM and cholesterol.

## 2019-03-16 NOTE — Telephone Encounter (Signed)
-----   Message from Ladell Pier, MD sent at 03/15/2019  8:07 AM EDT ----- Let patient know that he is no longer anemic.  His kidney function is not 100% and is a little worse compared to 1 year ago.  LDL cholesterol is 118 with goal being less than 70.  It is very important that he takes the atorvastatin daily to keep his cholesterol down and help prevent future heart attack and strokes.  His A1c has been confirmed at 11.  This means his diabetes is not well controlled.  I recommend adding another medication called Trulicity which is a once weekly injection to help get his blood sugars better.  Please schedule an appointment with Lurena Joiner, our clinical pharmacist to teach him how to do the Trulicity injection.

## 2019-03-20 ENCOUNTER — Other Ambulatory Visit: Payer: Self-pay

## 2019-03-20 ENCOUNTER — Ambulatory Visit: Payer: Medicare Other | Attending: Internal Medicine | Admitting: Pharmacist

## 2019-03-20 ENCOUNTER — Encounter: Payer: Self-pay | Admitting: Pharmacist

## 2019-03-20 DIAGNOSIS — Z79899 Other long term (current) drug therapy: Secondary | ICD-10-CM

## 2019-03-20 NOTE — Patient Instructions (Signed)
Dulaglutide injection What is this medicine? DULAGLUTIDE (DOO la GLOO tide) is used to improve blood sugar control in adults with type 2 diabetes. This medicine may be used with other oral diabetes medicines. This drug may also reduce the risk of heart attack or stroke if you have type 2 diabetes and risk factors for heart disease. This medicine may be used for other purposes; ask your health care provider or pharmacist if you have questions. COMMON BRAND NAME(S): TRULICITY What should I tell my health care provider before I take this medicine? They need to know if you have any of these conditions:  endocrine tumors (MEN 2) or if someone in your family had these tumors  eye disease, vision problems  history of pancreatitis  kidney disease  liver disease  stomach problems  thyroid cancer or if someone in your family had thyroid cancer  an unusual or allergic reaction to dulaglutide, other medicines, foods, dyes, or preservatives  pregnant or trying to get pregnant  breast-feeding How should I use this medicine? This medicine is for injection under the skin of your upper leg (thigh), stomach area, or upper arm. It is usually given once every week (every 7 days). You will be taught how to prepare and give this medicine. Use exactly as directed. Take your medicine at regular intervals. Do not take it more often than directed. If you use this medicine with insulin, you should inject this medicine and the insulin separately. Do not mix them together. Do not give the injections right next to each other. Change (rotate) injection sites with each injection. It is important that you put your used needles and syringes in a special sharps container. Do not put them in a trash can. If you do not have a sharps container, call your pharmacist or healthcare provider to get one. A special MedGuide will be given to you by the pharmacist with each prescription and refill. Be sure to read this information  carefully each time. Talk to your pediatrician regarding the use of this medicine in children. Special care may be needed. Overdosage: If you think you have taken too much of this medicine contact a poison control center or emergency room at once. NOTE: This medicine is only for you. Do not share this medicine with others. What if I miss a dose? If you miss a dose, take it as soon as you can within 3 days after the missed dose. Then take your next dose at your regular weekly time. If it has been longer than 3 days after the missed dose, do not take the missed dose. Take the next dose at your regular time. Do not take double or extra doses. If you have questions about a missed dose, contact your health care provider for advice. What may interact with this medicine?  other medicines for diabetes Many medications may cause changes in blood sugar, these include:  alcohol containing beverages  antiviral medicines for HIV or AIDS  aspirin and aspirin-like drugs  certain medicines for blood pressure, heart disease, irregular heart beat  chromium  diuretics  male hormones, such as estrogens or progestins, birth control pills  fenofibrate  gemfibrozil  isoniazid  lanreotide  male hormones or anabolic steroids  MAOIs like Carbex, Eldepryl, Marplan, Nardil, and Parnate  medicines for weight loss  medicines for allergies, asthma, cold, or cough  medicines for depression, anxiety, or psychotic disturbances  niacin  nicotine  NSAIDs, medicines for pain and inflammation, like ibuprofen or naproxen  octreotide  pasireotide  pentamidine  phenytoin  probenecid  quinolone antibiotics such as ciprofloxacin, levofloxacin, ofloxacin  some herbal dietary supplements  steroid medicines such as prednisone or cortisone  sulfamethoxazole; trimethoprim  thyroid hormones Some medications can hide the warning symptoms of low blood sugar (hypoglycemia). You may need to monitor  your blood sugar more closely if you are taking one of these medications. These include:  beta-blockers, often used for high blood pressure or heart problems (examples include atenolol, metoprolol, propranolol)  clonidine  guanethidine  reserpine This list may not describe all possible interactions. Give your health care provider a list of all the medicines, herbs, non-prescription drugs, or dietary supplements you use. Also tell them if you smoke, drink alcohol, or use illegal drugs. Some items may interact with your medicine. What should I watch for while using this medicine? Visit your doctor or health care professional for regular checks on your progress. Drink plenty of fluids while taking this medicine. Check with your doctor or health care professional if you get an attack of severe diarrhea, nausea, and vomiting. The loss of too much body fluid can make it dangerous for you to take this medicine. A test called the HbA1C (A1C) will be monitored. This is a simple blood test. It measures your blood sugar control over the last 2 to 3 months. You will receive this test every 3 to 6 months. Learn how to check your blood sugar. Learn the symptoms of low and high blood sugar and how to manage them. Always carry a quick-source of sugar with you in case you have symptoms of low blood sugar. Examples include hard sugar candy or glucose tablets. Make sure others know that you can choke if you eat or drink when you develop serious symptoms of low blood sugar, such as seizures or unconsciousness. They must get medical help at once. Tell your doctor or health care professional if you have high blood sugar. You might need to change the dose of your medicine. If you are sick or exercising more than usual, you might need to change the dose of your medicine. Do not skip meals. Ask your doctor or health care professional if you should avoid alcohol. Many nonprescription cough and cold products contain sugar or  alcohol. These can affect blood sugar. Pens should never be shared. Even if the needle is changed, sharing may result in passing of viruses like hepatitis or HIV. Wear a medical ID bracelet or chain, and carry a card that describes your disease and details of your medicine and dosage times. What side effects may I notice from receiving this medicine? Side effects that you should report to your doctor or health care professional as soon as possible:  allergic reactions like skin rash, itching or hives, swelling of the face, lips, or tongue  breathing problems  changes in vision  diarrhea that continues or is severe  lump or swelling on the neck  severe nausea  signs and symptoms of infection like fever or chills; cough; sore throat; pain or trouble passing urine  signs and symptoms of low blood sugar such as feeling anxious, confusion, dizziness, increased hunger, unusually weak or tired, sweating, shakiness, cold, irritable, headache, blurred vision, fast heartbeat, loss of consciousness  signs and symptoms of kidney injury like trouble passing urine or change in the amount of urine  trouble swallowing  unusual stomach upset or pain  vomiting Side effects that usually do not require medical attention (report to your doctor or health care  professional if they continue or are bothersome):  diarrhea  loss of appetite  nausea  pain, redness, or irritation at site where injected  stomach upset This list may not describe all possible side effects. Call your doctor for medical advice about side effects. You may report side effects to FDA at 1-800-FDA-1088. Where should I keep my medicine? Keep out of the reach of children. Store unopened pens in a refrigerator between 2 and 8 degrees C (36 and 46 degrees F). Do not freeze or use if the medicine has been frozen. Protect from light and excessive heat. Store in the carton until use. Each single-dose pen can be kept at room  temperature, not to exceed 30 degrees C (86 degrees F) for a total of 14 days, if needed. Throw away any unused medicine after the expiration date on the label. NOTE: This sheet is a summary. It may not cover all possible information. If you have questions about this medicine, talk to your doctor, pharmacist, or health care provider.  2020 Elsevier/Gold Standard (2018-11-14 15:36:44)

## 2019-03-20 NOTE — Progress Notes (Signed)
Patient was educated on the use of the Trulicity pen. Reviewed necessary supplies and operation of the pen. Also reviewed goal blood glucose levels. Patient was able to demonstrate use. All questions and concerns were addressed.  Benard Halsted, PharmD, [place] [phone]

## 2019-03-27 ENCOUNTER — Ambulatory Visit: Payer: Medicare Other | Attending: Internal Medicine | Admitting: Internal Medicine

## 2019-03-27 ENCOUNTER — Other Ambulatory Visit: Payer: Self-pay

## 2019-03-27 ENCOUNTER — Encounter: Payer: Self-pay | Admitting: Internal Medicine

## 2019-03-27 VITALS — BP 185/90 | HR 90 | Temp 98.6°F | Resp 18 | Ht 66.0 in | Wt 174.0 lb

## 2019-03-27 DIAGNOSIS — Z794 Long term (current) use of insulin: Secondary | ICD-10-CM

## 2019-03-27 DIAGNOSIS — E118 Type 2 diabetes mellitus with unspecified complications: Secondary | ICD-10-CM

## 2019-03-27 DIAGNOSIS — I10 Essential (primary) hypertension: Secondary | ICD-10-CM

## 2019-03-27 DIAGNOSIS — E1165 Type 2 diabetes mellitus with hyperglycemia: Secondary | ICD-10-CM

## 2019-03-27 DIAGNOSIS — IMO0002 Reserved for concepts with insufficient information to code with codable children: Secondary | ICD-10-CM

## 2019-03-27 LAB — GLUCOSE, POCT (MANUAL RESULT ENTRY): POC Glucose: 340 mg/dl — AB (ref 70–99)

## 2019-03-27 NOTE — Progress Notes (Signed)
Patient ID: Timothy Lambert, male    DOB: 17-Jun-1956  MRN: [id number]  CC: Follow-up   Subjective: Timothy Lambert is a 63 y.o. male who presents for 2 weeks follow-up on diabetes and blood pressure His concerns today include:  Pt with hx of DM, HTN, HL, CAD with DES LAD 2016,embolicCVA, PAF (thought to be a poor candidate for anticoagulation because of history of EtOH abuse at the time of his CVA in 2016) ETOH withdrawal sz, CKD stage 3, macroalbuminuria  DM: On last visit with me patient A1c was 11 but reported home blood sugar readings were in the 100s.  I did A1c by blood draw and fructosamine level and both were elevated.  I recommended adding Trulicity.  He has met with the clinical pharmacist for teaching but has not picked up the Trulicity as yet.  He plans to pick up today.  Blood sugar today was 340.  Patient states that he just ate [employer].  He ended up having to work a double shift today states he just grab a quick bike to eat driving over here.  He continues to check blood sugars at home but does not have a log book with him. -Has not received appointment with endocrinologist as yet. -Referred to ophthalmology on last visit.  He was called for the appointment but he decided not to schedule with them.  He states he plans to call the ophthalmologist who he used to see in [place] to schedule  HTN: Patient states that he just took his medicines about 20 minutes ago because he ended up working a double shift and did not get to take his medicines around 9 in the morning which would have been the time when he usually gets off.    Checks BP 2 x since  last visit. He does not recall the numbers -has appt with neph 03/30/2019 -He denies any chest pain, shortness of breath, lower extremity edema  Patient Active Problem List   Diagnosis Date Noted  . CKD (chronic kidney disease) stage 3, GFR 30-59 ml/min (HCC) 02/15/2018  . Macroalbuminuric diabetic nephropathy ([place]) 02/15/2018   . Diabetic retinopathy ([place]) 11/18/2015  . Paroxysmal atrial fibrillation ([place]) 10/28/2015  . Cognitive deficit, post-stroke 05/13/2015  . Essential hypertension 02/19/2015  . CAD (coronary artery disease) 02/19/2015  . 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 CVA (cerebral vascular accident) ([place])   . Combined systolic and diastolic congestive heart failure ([place])   . Diabetes type 2, uncontrolled ([place])   . Seizure [facility])      Current Outpatient Medications on File Prior to Visit  Medication Sig Dispense Refill  . aspirin EC 81 MG tablet Take 1 tablet (81 mg total) by mouth daily. 100 tablet 1  . atorvastatin (LIPITOR) 40 MG tablet Take 1 tablet (40 mg total) by mouth daily at 6 PM. 90 tablet 3  . Blood Glucose Monitoring Suppl (TRUE METRIX METER) w/Device KIT Use as directed 1 kit 0  . carvedilol (COREG) 25 MG tablet Take 2 tablets (50 mg total) by mouth 2 (two) times daily with a meal. 120 tablet 5  . furosemide (LASIX) 20 MG tablet Take 1 tablet (20 mg total) by mouth daily. Needs appt for more refills. 30 tablet 6  . glucose blood (TRUE METRIX BLOOD GLUCOSE TEST) test strip Use as instructed 100 each 12  . Insulin Glargine (LANTUS SOLOSTAR) 100 UNIT/ML Solostar Pen Inject 28 Units into the skin daily at 10 pm. 15 mL 3  .  Insulin Syringe-Needle U-100 (INSULIN SYRINGE .5CC/30GX1/2") 30G X 1/2" 0.5 ML MISC Check blood sugar TID & QHS 100 each 2  . isosorbide-hydrALAZINE (BIDIL) 20-37.5 MG tablet Take 2 tablets by mouth 2 (two) times daily. 120 tablet 6  . Lancets (ACCU-CHEK SOFT TOUCH) lancets Use as instructed 100 each 12  . lisinopril (PRINIVIL,ZESTRIL) 20 MG tablet Take 1 tablet (20 mg total) by mouth daily. 90 tablet 1  . TRUEPLUS LANCETS 28G MISC 1 each by Does not apply route 3 (three) times daily before meals. 100 each 12  . TRUEPLUS PEN NEEDLES 32G X 4 MM MISC USE TO INJECT INSULIN AS DIRECTED 100 each 5  . Dulaglutide (TRULICITY) 8.84 ZY/6.0YT SOPN Inject 0.75 mg into the  skin once a week. (Patient not taking: Reported on 03/27/2019) 4 pen 3   No current facility-administered medications on file prior to visit.     No Known Allergies  Social History   Socioeconomic History  . Marital status: Married    Spouse name: Not on file  . Number of children: Not on file  . Years of education: Not on file  . Highest education level: Not on file  Occupational History  . Not on file  Social Needs  . Financial resource strain: Not on file  . Food insecurity    Worry: Not on file    Inability: Not on file  . Transportation needs    Medical: Not on file    Non-medical: Not on file  Tobacco Use  . Smoking status: Former Smoker    Packs/day: 0.33    Years: 14.00    Pack years: 4.62    Types: Cigarettes  . Smokeless tobacco: Never Used  . Tobacco comment: "quit smoking cigarettes in 1992"  Substance and Sexual Activity  . Alcohol use: No    Comment: 02/19/2015 "stopped drinking 01/22/2015"  . Drug use: No  . Sexual activity: Not Currently  Lifestyle  . Physical activity    Days per week: Not on file    Minutes per session: Not on file  . Stress: Not on file  Relationships  . Social Herbalist on phone: Not on file    Gets together: Not on file    Attends religious service: Not on file    Active member of club or organization: Not on file    Attends meetings of clubs or organizations: Not on file    Relationship status: Not on file  . Intimate partner violence    Fear of current or ex partner: Not on file    Emotionally abused: Not on file    Physically abused: Not on file    Forced sexual activity: Not on file  Other Topics Concern  . Not on file  Social History Narrative   Lives   Caffeine use: Coffee sometimes    Family History  Problem Relation Age of Onset  . Diabetes Father   . Seizures Neg Hx     Past Surgical History:  Procedure Laterality Date  . CARDIAC CATHETERIZATION N/A 01/23/2015   Procedure: Left Heart Cath and  Coronary Angiography;  Surgeon: [name], MD;  Location: [facility] INVASIVE CV LAB CUPID;  Service: Cardiovascular;  Laterality: N/A;  . CARDIAC CATHETERIZATION N/A 01/23/2015   Procedure: Coronary Stent Intervention;  Surgeon: [name], MD;  Location: [facility] INVASIVE CV LAB CUPID;  Service: Cardiovascular;  Laterality: N/A;    ROS: Review of Systems Negative except as stated above  PHYSICAL EXAM: BP Marland Kitchen)  185/90   Pulse 90   Temp 98.6 F (37 C) (Oral)   Resp 18   Ht _0  (1.676 m)   Wt 174 lb (78.9 kg)   SpO2 97%   BMI 28.08 kg/m   Physical Exam BP 185/90  General appearance - alert, well appearing, and in no distress Mental status - normal mood, behavior, speech, dress, motor activity, and thought processes Chest - clear to auscultation, no wheezes, rales or rhonchi, symmetric air entry Heart - normal rate, regular rhythm, normal S1, S2, no murmurs, rubs, clicks or gallops  Results for orders placed or performed in visit on 03/27/19  Glucose (CBG)  Result Value Ref Range   POC Glucose 340 (A) 70 - 99 mg/dl    CMP Latest Ref Rng & Units 03/13/2019 12/10/2017 09/06/2015  Glucose 65 - 99 mg/dL 242(H) 111(H) 149(H)  BUN 8 - 27 mg/dL 33(H) 22 13  Creatinine 0.76 - 1.27 mg/dL 2.05(H) 1.82(H) 1.19  Sodium 134 - 144 mmol/L 139 144 140  Potassium 3.5 - 5.2 mmol/L 4.7 4.0 4.1  Chloride 96 - 106 mmol/L 104 107(H) 103  CO2 20 - 29 mmol/L _1 Calcium 8.6 - 10.2 mg/dL 9.6 8.5(L) 9.3  Total Protein 6.0 - 8.5 g/dL 6.6 6.1 -  Total Bilirubin 0.0 - 1.2 mg/dL 0.4 0.2 -  Alkaline Phos 39 - 117 IU/L 86 68 -  AST 0 - 40 IU/L 17 18 -  ALT 0 - 44 IU/L 22 22 -   Lipid Panel     Component Value Date/Time   CHOL 174 03/13/2019 1639   TRIG 88 03/13/2019 1639   HDL 38 (L) 03/13/2019 1639   CHOLHDL 4.6 03/13/2019 1639   CHOLHDL 3.0 04/25/2015 1216   VLDL 17 04/25/2015 1216   LDLCALC 118 (H) 03/13/2019 1639    CBC    Component Value Date/Time   WBC 6.8 03/13/2019 1639   WBC 8.8  04/02/2015 1014   RBC 4.51 03/13/2019 1639   RBC 3.45 (L) 04/02/2015 1014   HGB 13.8 03/13/2019 1639   HCT 40.1 03/13/2019 1639   PLT 194 03/13/2019 1639   MCV 89 03/13/2019 1639   MCH 30.6 03/13/2019 1639   MCH 30.0 02/20/2015 0509   MCHC 34.4 03/13/2019 1639   MCHC 33.5 04/02/2015 1014   RDW 13.5 03/13/2019 1639   LYMPHSABS 1.7 02/19/2015 2333   MONOABS 0.5 02/19/2015 2333   EOSABS 0.6 02/19/2015 2333   BASOSABS 0.0 02/19/2015 2333    ASSESSMENT AND PLAN: 1. Uncontrolled diabetes mellitus with complication, with long-term current use of insulin ([place]) -Dietary counseling given.  Encourage good food choices Patient to pick up Trulicity today and start taking once a week. -Await appointment with endocrinologist -Advised to schedule his eye appointment as soon as possible - Glucose (CBG)  2. Essential hypertension Uncontrolled.  Repeat blood pressure today done by me was better but still not controlled.  Patient has just taken his blood pressure medications.  Encouraged him to keep a log of blood pressure readings at home and bring them in on next visit in 1 month    Patient was given the opportunity to ask questions.  Patient verbalized understanding of the plan and was able to repeat key elements of the plan.   Orders Placed This Encounter  Procedures  . Glucose (CBG)     Requested Prescriptions    No prescriptions requested or ordered in this encounter    Return in about 4 weeks (  around 04/24/2019).  Karle Plumber, MD, FACP

## 2019-03-27 NOTE — Patient Instructions (Signed)
Please check your blood pressure at least 2-3 times a week and write down your numbers.  Our goal is to get to 130/80 or lower.  Please check your blood sugars at least twice a day and write down the numbers and bring those in on your next visit.  Pick up the Trulicity prescription today and take once a week as prescribed.  Please make sure and schedule your eye exam with  Provider whom you prefer in [facility] since you declined the appointment with hte one that we referred you to.

## 2019-03-31 MED FILL — LANTUS SOLOSTAR 100 UNITS/M: 100 | 23 days supply | Qty: 6 | Fill #7

## 2019-04-24 ENCOUNTER — Ambulatory Visit: Payer: Medicare Other | Attending: Internal Medicine | Admitting: Internal Medicine

## 2019-04-24 ENCOUNTER — Other Ambulatory Visit: Payer: Self-pay

## 2019-04-24 MED FILL — FUROSEMIDE 20 MG TABS: 20 | 90 days supply | Qty: 90 | Fill #1

## 2019-04-28 MED FILL — LANTUS SOLOSTAR 100 UNITS/M: 100 | 23 days supply | Qty: 6 | Fill #8

## 2019-05-12 MED FILL — CARVEDILOL 25 MG TABLET: 25 | 30 days supply | Qty: 120 | Fill #1

## 2019-05-12 MED FILL — ATORVASTATIN CALCIUM 40 MG: 40 | 30 days supply | Qty: 30 | Fill #1

## 2019-05-19 MED FILL — BIDIL TABLET: 20-37.5 | 30 days supply | Qty: 120 | Fill #1

## 2019-05-19 MED FILL — LANTUS SOLOSTAR 100 UNITS/M: 100 | 32 days supply | Qty: 9 | Fill #0

## 2019-05-25 ENCOUNTER — Ambulatory Visit: Payer: Medicare Other | Attending: Internal Medicine | Admitting: Internal Medicine

## 2019-05-25 DIAGNOSIS — Z794 Long term (current) use of insulin: Secondary | ICD-10-CM | POA: Diagnosis not present

## 2019-05-25 DIAGNOSIS — E1165 Type 2 diabetes mellitus with hyperglycemia: Secondary | ICD-10-CM

## 2019-05-25 DIAGNOSIS — E118 Type 2 diabetes mellitus with unspecified complications: Secondary | ICD-10-CM | POA: Diagnosis not present

## 2019-05-25 DIAGNOSIS — IMO0002 Reserved for concepts with insufficient information to code with codable children: Secondary | ICD-10-CM

## 2019-05-25 MED ORDER — LANTUS SOLOSTAR 100 UNIT/ML ~~LOC~~ SOPN: 30.0000 [IU] | PEN_INJECTOR | Freq: Every day | SUBCUTANEOUS | 3 refills | Status: DC

## 2019-05-25 NOTE — Progress Notes (Signed)
Pt states his blood sugar this morning was 130

## 2019-05-25 NOTE — Progress Notes (Signed)
Virtual Visit via Telephone Note Due to current restrictions/limitations of in-office visits due to the COVID-19 pandemic, this scheduled clinical appointment was converted to a telehealth visit  I connected with Timothy Lambert on 05/25/19 at 3:39 p.m by telephone and verified that I am speaking with the correct person using two identifiers. I am in my office.  The patient is at home.  Only the patient and myself participated in this encounter.  I discussed the limitations, risks, security and privacy concerns of performing an evaluation and management service by telephone and the availability of in person appointments. I also discussed with the patient that there may be a patient responsible charge related to this service. The patient expressed understanding and agreed to proceed.   History of Present Illness: Pt with hx of DM, HTN, HL, CAD with DES LAD 2016,embolicCVA, PAF (thought to be a poor candidate for anticoagulation because of history of EtOH abuse at the time of his CVA in 2016) ETOH withdrawal sz, CKD stage 3, macroalbuminuria  DM:  On last visit with me he had not picked or started taking the Trulicity.  Since then he has started the Trulicity and has been on it for about 3 weeks.  Also reports compliance with Lantus 28 units daily.   Gets up for work at 5 a.m and checks BS, range 155-160.  Checks again about 30 mins after morning brunch and that range is 190s.  Reports BS have come done since being on [place]  On last visit he told me that he plans to schedule his eye appointment with the eye doctor in [place] but has not done so as yet.   Referred endocrinologist in 02/2019.  He was called for the appointment but declined because he thought they were calling him about the appointment for his eyes which he intends to have done in [place].   Current Outpatient Medications on File Prior to Visit  Medication Sig Dispense Refill  . aspirin EC 81 MG tablet Take 1  tablet (81 mg total) by mouth daily. 100 tablet 1  . atorvastatin (LIPITOR) 40 MG tablet Take 1 tablet (40 mg total) by mouth daily at 6 PM. 90 tablet 3  . Blood Glucose Monitoring Suppl (TRUE METRIX METER) w/Device KIT Use as directed 1 kit 0  . carvedilol (COREG) 25 MG tablet Take 2 tablets (50 mg total) by mouth 2 (two) times daily with a meal. 120 tablet 5  . Dulaglutide (TRULICITY) 7.21 CC/8.8FD SOPN Inject 0.75 mg into the skin once a week. (Patient not taking: Reported on 03/27/2019) 4 pen 3  . furosemide (LASIX) 20 MG tablet Take 1 tablet (20 mg total) by mouth daily. Needs appt for more refills. 30 tablet 6  . glucose blood (TRUE METRIX BLOOD GLUCOSE TEST) test strip Use as instructed 100 each 12  . Insulin Glargine (LANTUS SOLOSTAR) 100 UNIT/ML Solostar Pen Inject 28 Units into the skin daily at 10 pm. 15 mL 3  . Insulin Syringe-Needle U-100 (INSULIN SYRINGE .5CC/30GX1/2") 30G X 1/2" 0.5 ML MISC Check blood sugar TID & QHS 100 each 2  . isosorbide-hydrALAZINE (BIDIL) 20-37.5 MG tablet Take 2 tablets by mouth 2 (two) times daily. 120 tablet 6  . Lancets (ACCU-CHEK SOFT TOUCH) lancets Use as instructed 100 each 12  . lisinopril (PRINIVIL,ZESTRIL) 20 MG tablet Take 1 tablet (20 mg total) by mouth daily. 90 tablet 1  . TRUEPLUS LANCETS 28G MISC 1 each by Does not apply route 3 (three) times daily before  meals. 100 each 12  . TRUEPLUS PEN NEEDLES 32G X 4 MM MISC USE TO INJECT INSULIN AS DIRECTED 100 each 5   No current facility-administered medications on file prior to visit.     Observations/Objective: No direct observation done as this was a telephone encounter.   Assessment and Plan: Uncontrolled diabetes mellitus with complication, with long-term current use of insulin ([place]) - Plan: Ambulatory referral to Endocrinology -I explained to the patient that I would like to get him in with an endocrinologist to help with management of his diabetes.  He is agreeable to me resubmitting the  referral.  Encouraged him to schedule his eye exam as he is overdue. Increase Lantus to 30 units Continue Trulicity. Continue checking blood sugars twice a day.  Advised that if he checks blood sugar after meal it should be 2 hours after a meal.  Keep a log of blood sugars and bring in those readings in about 4 weeks to meet with our clinical pharmacist.  Clinical pharmacist should get an A1c on him at that visit.  Follow Up Instructions: 2 mths   I discussed the assessment and treatment plan with the patient. The patient was provided an opportunity to ask questions and all were answered. The patient agreed with the plan and demonstrated an understanding of the instructions.   The patient was advised to call back or seek an in-person evaluation if the symptoms worsen or if the condition fails to improve as anticipated.  I provided 11 minutes of non-face-to-face time during this encounter.   Karle Plumber, MD

## 2019-05-26 MED FILL — TRULICITY 0.75 MG/0.5 ML PE: 0.75 | 28 days supply | Qty: 2 | Fill #1

## 2019-05-26 MED FILL — TRUEPLUS PEN NDL 32GX5/32": 32G X 4 MM | 30 days supply | Qty: 100 | Fill #1

## 2019-05-26 MED FILL — TRUEPLUS PEN NDL 32GX5/32: 32G X 4 MM | 30 days supply | Qty: 100 | Fill #1

## 2019-06-13 ENCOUNTER — Other Ambulatory Visit: Payer: Self-pay | Admitting: Internal Medicine

## 2019-06-13 ENCOUNTER — Ambulatory Visit: Payer: Medicare Other | Attending: Internal Medicine | Admitting: Pharmacist

## 2019-06-13 ENCOUNTER — Other Ambulatory Visit: Payer: Self-pay

## 2019-06-13 DIAGNOSIS — Z794 Long term (current) use of insulin: Secondary | ICD-10-CM

## 2019-06-13 DIAGNOSIS — E1165 Type 2 diabetes mellitus with hyperglycemia: Secondary | ICD-10-CM

## 2019-06-13 DIAGNOSIS — E118 Type 2 diabetes mellitus with unspecified complications: Secondary | ICD-10-CM

## 2019-06-13 DIAGNOSIS — I10 Essential (primary) hypertension: Secondary | ICD-10-CM

## 2019-06-13 DIAGNOSIS — IMO0002 Reserved for concepts with insufficient information to code with codable children: Secondary | ICD-10-CM

## 2019-06-13 LAB — POCT GLYCOSYLATED HEMOGLOBIN (HGB A1C): Hemoglobin A1C: 8.1 % — AB (ref 4.0–5.6)

## 2019-06-13 MED ORDER — TRULICITY 1.5 MG/0.5ML ~~LOC~~ SOAJ: 1.5000 mg | SUBCUTANEOUS | 2 refills | Status: DC

## 2019-06-13 MED FILL — !TRULICITY 1.5 MG/0.5 ML PE: 1.5 | 28 days supply | Qty: 2 | Fill #0

## 2019-06-13 NOTE — Progress Notes (Signed)
    S:     No chief complaint on file.  Patient arrives in good spirits.  Presents for diabetes evaluation, education, and management Patient was referred and last seen by Primary Care Provider on 05/25/19.   Family/Social History:  FH: DM (father) Tobacco: former smoker (quit in 1992) Alcohol: denies use since 2016  Insurance coverage/medication affordability: [place]  Patient reports adherence with medications.  Current diabetes medications include: Trulicity [id number] mg weekly, Lantus 30 units daily,  Current hypertension medications include: carvedilol 25 mg BID, furosemide 20 mg daily, Bidil 2 tablets BID, lisinopril 20 mg daily Current hyperlipidemia medications include: atorvastatin 40 mg daily  Patient denies hypoglycemic events.  Patient reported dietary habits: - Pt reports doing better with his diet  - Pt reports eating "rabbit food"   Patient-reported exercise habits:  - Pt walks daily   Patient denies nocturia.  Patient denies neuropathy. Patient denies visual changes. Patient denies self foot exams.    O:  Lab Results  Component Value Date   HGBA1C 8.1 (A) 06/13/2019   There were no vitals filed for this visit.  Lipid Panel     Component Value Date/Time   CHOL 174 03/13/2019 1639   TRIG 88 03/13/2019 1639   HDL 38 (L) 03/13/2019 1639   CHOLHDL 4.6 03/13/2019 1639   CHOLHDL 3.0 04/25/2015 1216   VLDL 17 04/25/2015 1216   LDLCALC 118 (H) 03/13/2019 1639   Home fasting CBG: 133-140  2 hour post-prandial/random CBG: 132 - 157.  Clinical ASCVD: Yes  The ASCVD Risk score Mikey Bussing DC Jr., et al., 2013) failed to calculate for the following reasons:   The patient has a prior MI or stroke diagnosis   A/P: Diabetes longstanding currently above goal but much improved. Patient is able to verbalize appropriate hypoglycemia management plan. Patient is adherent with medication. Commended pt with his progress. He is amenable to increase Trulicity to 1.5 mg  weekly.  -Increased dose of Trulicity to 1.5 mg weekly.  -Continue other therapies -Extensively discussed pathophysiology of DM, recommended lifestyle interventions, dietary effects on glycemic control -Counseled on s/sx of and management of hypoglycemia -Next A1C anticipated 08/2019.   ASCVD risk - secondary prevention in patient with DM. Last LDL is not controlled. High intensity statin indicated. Patient may benefit from addition of Zetia in the future.  -Continued atorvastatin 40 mg.   HM: Influenza due.   Written patient instructions provided.  Total time in face to face counseling 15 minutes.   Follow up Pharmacist Clinic Visit in 1 month.     Benard Halsted, PharmD, [place] [phone]

## 2019-06-13 NOTE — Patient Instructions (Signed)
Thank you for coming to see me today. Please do the following:  1. Increase Trulicity to 1.5 mg weekly.  2. Continue Lantus 30 units daily. 3. Continue checking blood sugars at home. It's really important that you record these and bring these in to your next doctor's appointment. If you get in readings above 500 or lower than 70, call me or the clinic to let your doctor know. See below on how to treat low blood sugar.  4. Continue making the lifestyle changes we've discussed together during our visit. Diet and exercise play a significant role in improving your blood sugars.  5. Follow-up with me in 1 month.   Hypoglycemia or low blood sugar:   Low blood sugar can happen quickly and may become an emergency if not treated right away.   While this shouldn't happen often, it can be brought upon if you skip a meal or do not eat enough. Also, if your insulin or other diabetes medications are dosed too high, this can cause your blood sugar to go to low.   Warning signs of low blood sugar include: 1. Feeling shaky or dizzy 2. Feeling weak or tired  3. Excessive hunger 4. Feeling anxious or upset  5. Sweating even when you aren't exercising  What to do if I experience low blood sugar? 1. Check your blood sugar with your meter. If lower than 70, proceed to step 2.  2. Treat with 3-4 glucose tablets or 3 packets of regular sugar. If these aren't around, you can try hard candy. Yet another option would be to drink 4 ounces of fruit juice or 6 ounces of REGULAR soda.  3. Re-check your sugar in 15 minutes. If it is still below 70, do what you did in step 2 again. If has come back up, go ahead and eat a snack or small meal at this time.

## 2019-06-14 MED FILL — LISINOPRIL 20 MG TABLET: 20 | 90 days supply | Qty: 90 | Fill #0

## 2019-06-30 MED FILL — LANTUS SOLOSTAR 100 UNITS/M: 100 | 10 days supply | Qty: 3 | Fill #1

## 2019-07-13 ENCOUNTER — Ambulatory Visit: Payer: Medicare Other | Attending: Family Medicine | Admitting: Pharmacist

## 2019-07-13 ENCOUNTER — Other Ambulatory Visit: Payer: Self-pay

## 2019-07-13 ENCOUNTER — Encounter: Payer: Self-pay | Admitting: Pharmacist

## 2019-07-13 DIAGNOSIS — IMO0002 Reserved for concepts with insufficient information to code with codable children: Secondary | ICD-10-CM

## 2019-07-13 DIAGNOSIS — E1165 Type 2 diabetes mellitus with hyperglycemia: Secondary | ICD-10-CM | POA: Diagnosis not present

## 2019-07-13 DIAGNOSIS — Z794 Long term (current) use of insulin: Secondary | ICD-10-CM | POA: Diagnosis not present

## 2019-07-13 DIAGNOSIS — E118 Type 2 diabetes mellitus with unspecified complications: Secondary | ICD-10-CM

## 2019-07-13 LAB — GLUCOSE, POCT (MANUAL RESULT ENTRY): POC Glucose: 87 mg/dl (ref 70–99)

## 2019-07-13 MED FILL — LANTUS SOLOSTAR 100 UNITS/M: 100 | 9 days supply | Qty: 3 | Fill #0

## 2019-07-13 NOTE — Patient Instructions (Signed)
Thank you for coming to see me today. Please do the following:  1. Continue Trulicity, Lantus at current doses.  2. Continue checking blood sugars at home.  3. Continue making the lifestyle changes we've discussed together during our visit. Diet and exercise play a significant role in improving your blood sugars.  4. Follow-up with PCP 08/07/19.    Hypoglycemia or low blood sugar:   Low blood sugar can happen quickly and may become an emergency if not treated right away.   While this shouldn't happen often, it can be brought upon if you skip a meal or do not eat enough. Also, if your insulin or other diabetes medications are dosed too high, this can cause your blood sugar to go to low.   Warning signs of low blood sugar include: 1. Feeling shaky or dizzy 2. Feeling weak or tired  3. Excessive hunger 4. Feeling anxious or upset  5. Sweating even when you aren't exercising  What to do if I experience low blood sugar? 1. Check your blood sugar with your meter. If lower than 70, proceed to step 2.  2. Treat with 3-4 glucose tablets or 3 packets of regular sugar. If these aren't around, you can try hard candy. Yet another option would be to drink 4 ounces of fruit juice or 6 ounces of REGULAR soda.  3. Re-check your sugar in 15 minutes. If it is still below 70, do what you did in step 2 again. If has come back up, go ahead and eat a snack or small meal at this time.

## 2019-07-13 NOTE — Progress Notes (Signed)
    S:     No chief complaint on file.  Patient arrives in good spirits.  Presents for diabetes evaluation, education, and management Patient was referred and last seen by Primary Care Provider on 05/25/19.   Family/Social History:  FH: DM (father) Tobacco: former smoker (quit in 1992) Alcohol: denies use since 2016  Insurance coverage/medication affordability: [place]  Patient reports adherence with medications.  Current diabetes medications include: Trulicity 1.5 mg weekly, Lantus 30 units daily,  Current hypertension medications include: carvedilol 25 mg BID, furosemide 20 mg daily, Bidil 2 tablets BID, lisinopril 20 mg daily Current hyperlipidemia medications include: atorvastatin 40 mg daily  Patient denies hypoglycemic events.  Patient reported dietary habits: - Pt reports doing better with his diet  - Pt reports eating "rabbit food" - Reports drinking soda (counseling given)   Patient-reported exercise habits:  - Pt walks daily - Works outside daily    Patient denies nocturia.  Patient denies neuropathy. Patient denies visual changes. Patient denies self foot exams.    O: POCT:  87  Lab Results  Component Value Date   HGBA1C 8.1 (A) 06/13/2019   There were no vitals filed for this visit.  Lipid Panel     Component Value Date/Time   CHOL 174 03/13/2019 1639   TRIG 88 03/13/2019 1639   HDL 38 (L) 03/13/2019 1639   CHOLHDL 4.6 03/13/2019 1639   CHOLHDL 3.0 04/25/2015 1216   VLDL 17 04/25/2015 1216   LDLCALC 118 (H) 03/13/2019 1639   Home fasting CBG: not checking; reports meter is malfunctioning d/t battery  Clinical ASCVD: Yes  The ASCVD Risk score Mikey Bussing DC Jr., et al., 2013) failed to calculate for the following reasons:   The patient has a prior MI or stroke diagnosis   A/P: Diabetes longstanding currently above goal based on A1c but home sugars are much improved. Patient is able to verbalize appropriate hypoglycemia management plan.  Patient is adherent with medication. Commended pt with his progress.  -Continued Trulicity to 1.5 mg weekly.  -Continue other therapies -Extensively discussed pathophysiology of DM, recommended lifestyle interventions, dietary effects on glycemic control -Counseled on s/sx of and management of hypoglycemia -Next A1C anticipated 08/2019.   ASCVD risk - secondary prevention in patient with DM. Last LDL is not controlled. High intensity statin indicated. Patient may benefit from addition of Zetia in the future.  -Continued atorvastatin 40 mg.   HM: Influenza due. Pt reports he will think about this and make a decision on whether or not to receive at his next PCP appointment.   Written patient instructions provided.  Total time in face to face counseling 15 minutes.   Follow up Pharmacist Clinic Visit in 1 month.     Benard Halsted, PharmD, [place] [phone]

## 2019-07-20 ENCOUNTER — Other Ambulatory Visit: Payer: Self-pay | Admitting: Pharmacist

## 2019-07-20 ENCOUNTER — Other Ambulatory Visit: Payer: Self-pay

## 2019-07-20 MED ORDER — TRULICITY 1.5 MG/0.5ML ~~LOC~~ SOAJ: 1.5000 mg | SUBCUTANEOUS | 0 refills | Status: DC

## 2019-07-20 MED ORDER — BASAGLAR KWIKPEN 100 UNIT/ML ~~LOC~~ SOPN: 30.0000 [IU] | PEN_INJECTOR | Freq: Every day | SUBCUTANEOUS | 0 refills | Status: DC

## 2019-07-20 NOTE — Telephone Encounter (Signed)
Trulicity sent to [facility] for PASS as authorized by Dr. Wynetta Emery on 07/13/2019

## 2019-07-25 MED FILL — CARVEDILOL 25 MG TABLET: 25 | 30 days supply | Qty: 120 | Fill #2

## 2019-07-25 MED FILL — ATORVASTATIN CALCIUM 40 MG: 40 | 30 days supply | Qty: 30 | Fill #2

## 2019-07-25 MED FILL — LANTUS SOLOSTAR 100 UNITS/M: 100 | 23 days supply | Qty: 6 | Fill #9

## 2019-07-26 MED FILL — AMLODIPINE BESYLATE 5 MG TA: 5 | 90 days supply | Qty: 90 | Fill #0

## 2019-07-31 ENCOUNTER — Encounter: Payer: Self-pay | Admitting: Internal Medicine

## 2019-07-31 ENCOUNTER — Other Ambulatory Visit: Payer: Self-pay | Admitting: Internal Medicine

## 2019-07-31 DIAGNOSIS — I10 Essential (primary) hypertension: Secondary | ICD-10-CM

## 2019-07-31 MED ORDER — LISINOPRIL 40 MG PO TABS: 40.0000 mg | ORAL_TABLET | Freq: Every day | ORAL | 3 refills | Status: DC

## 2019-07-31 NOTE — Progress Notes (Signed)
Patient seen by nephrology in follow-up on 06/27/2019.  Blood pressure was 198/106.  Lisinopril increased to 40 mg daily.  Plan was for him to follow-up with him in 2 weeks for repeat blood pressure check. Labs drawn on that visit revealed BUN 38/creatinine 2.32, H&H 11.9/34.6, PTH 43, urine with 2+ protein.

## 2019-08-07 ENCOUNTER — Ambulatory Visit: Payer: Medicare Other | Attending: Internal Medicine | Admitting: Internal Medicine

## 2019-08-07 ENCOUNTER — Other Ambulatory Visit: Payer: Self-pay

## 2019-08-07 ENCOUNTER — Encounter: Payer: Self-pay | Admitting: Internal Medicine

## 2019-08-07 VITALS — BP 187/95 | HR 87 | Temp 98.5°F | Resp 16 | Wt 170.6 lb

## 2019-08-07 DIAGNOSIS — Z794 Long term (current) use of insulin: Secondary | ICD-10-CM

## 2019-08-07 DIAGNOSIS — I10 Essential (primary) hypertension: Secondary | ICD-10-CM

## 2019-08-07 DIAGNOSIS — E1121 Type 2 diabetes mellitus with diabetic nephropathy: Secondary | ICD-10-CM | POA: Diagnosis not present

## 2019-08-07 LAB — GLUCOSE, POCT (MANUAL RESULT ENTRY): POC Glucose: 137 mg/dl — AB (ref 70–99)

## 2019-08-07 MED ORDER — CARVEDILOL 25 MG PO TABS: 37.5000 mg | ORAL_TABLET | Freq: Two times a day (BID) | ORAL | 5 refills | Status: DC

## 2019-08-07 MED FILL — LISINOPRIL 40 MG TABLET: 40 | 90 days supply | Qty: 90 | Fill #0

## 2019-08-07 NOTE — Progress Notes (Signed)
Patient ID: Timothy Lambert, male    DOB: December 04, 1955  MRN: [id number]  CC: Diabetes and Hypertension   Subjective: Timothy Lambert is a 63 y.o. male who presents for chronic ds management His concerns today include:  Pt with hx of DM, HTN, HL, CAD with DES LAD 2016,embolicCVA, PAF (thought to be a poor candidate for anticoagulation because of history of EtOH abuse at the time of his CVA in 2016) ETOH withdrawal sz, CKD stage 3, macroalbuminuria  HTN:  Lisinopril increased to 40 mg by nephrology on last visit.  Had only one 20 mg tab left which he took today.  Suppose to be on Cored 50 mg BID but he states he has only been taking one a day -he tells me the neph d/c Furosemide.  Has f/u appt next mth He denies any chest pain, shortness of breath, lower extremity edema, headaches or dizziness.  DM: He reports blood sugars have been much better since starting Trulicity.  Reports compliance with Lantus.  Checking BS BID in the mornings and at bedtime.  Morning BS 80-95 and evenings 120-130.  No low BS.  Last A1c had decreased to 8.1. Trulicity has dec appetite.   Appt with endocrinology later this month. Plans to schedule appt with eye doctor in [place] for end of this mth  Patient Active Problem List   Diagnosis Date Noted  . CKD (chronic kidney disease) stage 3, GFR 30-59 ml/min 02/15/2018  . Macroalbuminuric diabetic nephropathy ([place]) 02/15/2018  . Diabetic retinopathy ([place]) 11/18/2015  . Paroxysmal atrial fibrillation ([place]) 10/28/2015  . Cognitive deficit, post-stroke 05/13/2015  . Essential hypertension 02/19/2015  . CAD (coronary artery disease) 02/19/2015  . 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 CVA (cerebral vascular accident) ([place])   . Combined systolic and diastolic congestive heart failure ([place])   . Diabetes type 2, uncontrolled ([place])   . Seizure [facility])      Current Outpatient Medications on File Prior to Visit  Medication Sig Dispense Refill  . aspirin EC  81 MG tablet Take 1 tablet (81 mg total) by mouth daily. 100 tablet 1  . atorvastatin (LIPITOR) 40 MG tablet Take 1 tablet (40 mg total) by mouth daily at 6 PM. 90 tablet 3  . Blood Glucose Monitoring Suppl (TRUE METRIX METER) w/Device KIT Use as directed 1 kit 0  . carvedilol (COREG) 25 MG tablet Take 2 tablets (50 mg total) by mouth 2 (two) times daily with a meal. 120 tablet 5  . Dulaglutide (TRULICITY) 1.5 JZ/7.9XT SOPN Inject 1.5 mg into the skin once a week. 4 mL 0  . furosemide (LASIX) 20 MG tablet Take 1 tablet (20 mg total) by mouth daily. Needs appt for more refills. 30 tablet 6  . glucose blood (TRUE METRIX BLOOD GLUCOSE TEST) test strip Use as instructed 100 each 12  . Insulin Glargine (BASAGLAR KWIKPEN) 100 UNIT/ML SOPN Inject 0.3 mLs (30 Units total) into the skin at bedtime. 18 mL 0  . Insulin Syringe-Needle U-100 (INSULIN SYRINGE .5CC/30GX1/2") 30G X 1/2" 0.5 ML MISC Check blood sugar TID & QHS 100 each 2  . isosorbide-hydrALAZINE (BIDIL) 20-37.5 MG tablet Take 2 tablets by mouth 2 (two) times daily. 120 tablet 6  . Lancets (ACCU-CHEK SOFT TOUCH) lancets Use as instructed 100 each 12  . lisinopril (ZESTRIL) 40 MG tablet Take 1 tablet (40 mg total) by mouth daily. 30 tablet 3  . TRUEPLUS LANCETS 28G MISC 1 each by Does not apply route 3 (three)  times daily before meals. 100 each 12  . TRUEPLUS PEN NEEDLES 32G X 4 MM MISC USE TO INJECT INSULIN AS DIRECTED 100 each 5   No current facility-administered medications on file prior to visit.     No Known Allergies  Social History   Socioeconomic History  . Marital status: Married    Spouse name: Not on file  . Number of children: Not on file  . Years of education: Not on file  . Highest education level: Not on file  Occupational History  . Not on file  Social Needs  . Financial resource strain: Not on file  . Food insecurity    Worry: Not on file    Inability: Not on file  . Transportation needs    Medical: Not on file     Non-medical: Not on file  Tobacco Use  . Smoking status: Former Smoker    Packs/day: 0.33    Years: 14.00    Pack years: 4.62    Types: Cigarettes  . Smokeless tobacco: Never Used  . Tobacco comment: "quit smoking cigarettes in 1992"  Substance and Sexual Activity  . Alcohol use: No    Comment: 02/19/2015 "stopped drinking 01/22/2015"  . Drug use: No  . Sexual activity: Not Currently  Lifestyle  . Physical activity    Days per week: Not on file    Minutes per session: Not on file  . Stress: Not on file  Relationships  . Social Herbalist on phone: Not on file    Gets together: Not on file    Attends religious service: Not on file    Active member of club or organization: Not on file    Attends meetings of clubs or organizations: Not on file    Relationship status: Not on file  . Intimate partner violence    Fear of current or ex partner: Not on file    Emotionally abused: Not on file    Physically abused: Not on file    Forced sexual activity: Not on file  Other Topics Concern  . Not on file  Social History Narrative   Lives   Caffeine use: Coffee sometimes    Family History  Problem Relation Age of Onset  . Diabetes Father   . Seizures Neg Hx     Past Surgical History:  Procedure Laterality Date  . CARDIAC CATHETERIZATION N/A 01/23/2015   Procedure: Left Heart Cath and Coronary Angiography;  Surgeon: [name], MD;  Location: [facility] INVASIVE CV LAB CUPID;  Service: Cardiovascular;  Laterality: N/A;  . CARDIAC CATHETERIZATION N/A 01/23/2015   Procedure: Coronary Stent Intervention;  Surgeon: [name], MD;  Location: [facility] INVASIVE CV LAB CUPID;  Service: Cardiovascular;  Laterality: N/A;    ROS: Review of Systems Negative except as stated above  PHYSICAL EXAM: BP (!) 187/95   Pulse 87   Temp 98.5 F (36.9 C) (Oral)   Resp 16   Wt 170 lb 9.6 oz (77.4 kg)   SpO2 100%   BMI 27.54 kg/m   Physical Exam  Neck - supple, no significant adenopathy  Chest - clear to auscultation, no wheezes, rales or rhonchi, symmetric air entry Heart - normal rate, regular rhythm, normal S1, S2, no murmurs, rubs, clicks or gallops Extremities - peripheral pulses normal, no pedal edema, no clubbing or cyanosis Results for orders placed or performed in visit on 08/07/19  Glucose (CBG)  Result Value Ref Range   POC Glucose 137 (A) 70 -  99 mg/dl   Lab Results  Component Value Date   HGBA1C 8.1 (A) 06/13/2019    CMP Latest Ref Rng & Units 03/13/2019 12/10/2017 09/06/2015  Glucose 65 - 99 mg/dL 242(H) 111(H) 149(H)  BUN 8 - 27 mg/dL 33(H) 22 13  Creatinine 0.76 - 1.27 mg/dL 2.05(H) 1.82(H) 1.19  Sodium 134 - 144 mmol/L 139 144 140  Potassium 3.5 - 5.2 mmol/L 4.7 4.0 4.1  Chloride 96 - 106 mmol/L 104 107(H) 103  CO2 20 - 29 mmol/L 21 22 28   Calcium 8.6 - 10.2 mg/dL 9.6 8.5(L) 9.3  Total Protein 6.0 - 8.5 g/dL 6.6 6.1 -  Total Bilirubin 0.0 - 1.2 mg/dL 0.4 0.2 -  Alkaline Phos 39 - 117 IU/L 86 68 -  AST 0 - 40 IU/L 17 18 -  ALT 0 - 44 IU/L 22 22 -   Lipid Panel     Component Value Date/Time   CHOL 174 03/13/2019 1639   TRIG 88 03/13/2019 1639   HDL 38 (L) 03/13/2019 1639   CHOLHDL 4.6 03/13/2019 1639   CHOLHDL 3.0 04/25/2015 1216   VLDL 17 04/25/2015 1216   LDLCALC 118 (H) 03/13/2019 1639    CBC    Component Value Date/Time   WBC 6.8 03/13/2019 1639   WBC 8.8 04/02/2015 1014   RBC 4.51 03/13/2019 1639   RBC 3.45 (L) 04/02/2015 1014   HGB 13.8 03/13/2019 1639   HCT 40.1 03/13/2019 1639   PLT 194 03/13/2019 1639   MCV 89 03/13/2019 1639   MCH 30.6 03/13/2019 1639   MCH 30.0 02/20/2015 0509   MCHC 34.4 03/13/2019 1639   MCHC 33.5 04/02/2015 1014   RDW 13.5 03/13/2019 1639   LYMPHSABS 1.7 02/19/2015 2333   MONOABS 0.5 02/19/2015 2333   EOSABS 0.6 02/19/2015 2333   BASOSABS 0.0 02/19/2015 2333    ASSESSMENT AND PLAN: 1. Type 2 diabetes mellitus with diabetic nephropathy, with long-term current use of insulin ([employer]) Reported home  blood sugar readings are better. He will continue current dose of Trulicity and Lantus. Strongly advised him to schedule his eye appointment if he intends to do so.  Keep no appointment with endocrinology later this month. - Glucose (CBG)  2. Essential hypertension Not at goal.  He plans to pick up the lisinopril 40 mg today.  Since he has been taking carvedilol 25 mg twice a day instead of 50 mg, I have told him to increase carvedilol to 25 mg 1-1/2 tablets twice a day which would be 37.5 mg.  Continue to monitor blood pressure - carvedilol (COREG) 25 MG tablet; Take 1.5 tablets (37.5 mg total) by mouth 2 (two) times daily with a meal.  Dispense: 90 tablet; Refill: 5     Patient was given the opportunity to ask questions.  Patient verbalized understanding of the plan and was able to repeat key elements of the plan.   Orders Placed This Encounter  Procedures  . Glucose (CBG)     Requested Prescriptions    No prescriptions requested or ordered in this encounter    No follow-ups on file.  Karle Plumber, MD, FACP

## 2019-08-07 NOTE — Patient Instructions (Addendum)
You should be taking carvedilol 25 mg 1 1/2 tablets twice a day.   Please remember to call and schedule an appointment to have your eye exam done.

## 2019-08-14 ENCOUNTER — Ambulatory Visit: Payer: Medicare Other | Admitting: Internal Medicine

## 2019-08-21 MED FILL — LANTUS SOLOSTAR 100 UNITS/M: 100 | 9 days supply | Qty: 3 | Fill #1

## 2019-08-24 MED FILL — ATORVASTATIN CALCIUM 40 MG: 40 | 90 days supply | Qty: 90 | Fill #3

## 2019-08-25 ENCOUNTER — Ambulatory Visit: Payer: Medicare Other | Admitting: Internal Medicine

## 2019-08-29 ENCOUNTER — Ambulatory Visit: Payer: Medicare Other | Admitting: Internal Medicine

## 2019-08-29 NOTE — Progress Notes (Deleted)
Name: Timothy Lambert  MRN/ DOB: [id number], [birth year]   Age/ Sex: 63 y.o., male    PCP: Ladell Pier, MD   Reason for Endocrinology Evaluation: Type 2 Diabetes Mellitus     Date of Initial Endocrinology Visit: 08/29/2019     PATIENT IDENTIFIER: [name] is a 63 y.o. male with a past medical history of T2DM, CHF, CAD, CVA and CKD. The patient presented for initial endocrinology clinic visit on 08/29/2019 for consultative assistance with his diabetes management.    [name] was    Diagnosed with DM *** Prior Medications tried/Intolerance: *** Currently checking blood sugars *** x / day,  before breakfast and ***.  Hypoglycemia episodes : ***               Symptoms: ***                 Frequency: ***/  Hemoglobin A1c has ranged from 7.8% in 2019, peaking at 11.2% in 2020. Patient required assistance for hypoglycemia:  Patient has required hospitalization within the last 1 year from hyper or hypoglycemia:   In terms of diet, the patient ***   HOME DIABETES REGIMEN: Trulicity 1.5 mg weekly  Basaglar    Statin: yes ACE-I/ARB: yes Prior Diabetic Education: {Yes/No:11203}   METER DOWNLOAD SUMMARY: Date range evaluated: *** Fingerstick Blood Glucose Tests = *** Average Number Tests/Day = *** Overall Mean FS Glucose = *** Standard Deviation = ***  BG Ranges: Low = *** High = ***   Hypoglycemic Events/30 Days: BG < 50 = *** Episodes of symptomatic severe hypoglycemia = ***   DIABETIC COMPLICATIONS: Microvascular complications:   ***  Denies: CKD III  Last eye exam: Completed   Macrovascular complications:   CVA  Denies: CAD, PVD   PAST HISTORY: Past Medical History:  Past Medical History:  Diagnosis Date  . Acute renal failure (ARF) ([place]) 02/19/2015  . CHF (congestive heart failure) ([place])   . Coronary artery disease   . GERD (gastroesophageal reflux disease)   . HLD (hyperlipidemia)   . Hypertension   . Myocardial  infarction ([place]) 01/22/2015   "massive"  . Refusal of blood transfusions as patient is Jehovah's Witness   . Seizures ([place]) 01/22/2015   "in front of paramedics"  . Stroke [facility]) 01/22/2015   "short term memory loss & right side weak since" (02/19/2015)  . Type II diabetes mellitus ([place])     Past Surgical History:  Past Surgical History:  Procedure Laterality Date  . CARDIAC CATHETERIZATION N/A 01/23/2015   Procedure: Left Heart Cath and Coronary Angiography;  Surgeon: [name], MD;  Location: [facility] CUP[facility];  Service: Cardiovascular;  Laterality: N/A;  . CARDIAC CATHETERIZATION N/A 01/23/2015   Procedure: Coronary Stent Intervention;  Surgeon: [name], MD;  Location: [facility] INVASIVE CV LAB CUPID;  Service: Cardiovascular;  Laterality: N/A;      Social History:  reports that he has quit smoking. His smoking use included cigarettes. He has a 4.62 pack-year smoking history. He has never used smokeless tobacco. He reports that he does not drink alcohol or use drugs. Family History:  Family History  Problem Relation Age of Onset  . Diabetes Father   . Seizures Neg Hx       HOME MEDICATIONS: Allergies as of 08/29/2019   No Known Allergies     Medication List       Accurate as of August 29, 2019 12:47 PM. If you have any  questions, ask your nurse or doctor.        aspirin EC 81 MG tablet Take 1 tablet (81 mg total) by mouth daily.   atorvastatin 40 MG tablet Commonly known as: LIPITOR Take 1 tablet (40 mg total) by mouth daily at 6 PM.   Basaglar KwikPen 100 UNIT/ML Sopn Inject 0.3 mLs (30 Units total) into the skin at bedtime.   carvedilol 25 MG tablet Commonly known as: COREG Take 1.5 tablets (37.5 mg total) by mouth 2 (two) times daily with a meal.   furosemide 20 MG tablet Commonly known as: LASIX Take 1 tablet (20 mg total) by mouth daily. Needs appt for more refills.   glucose blood test strip Commonly known as: True Metrix Blood Glucose Test Use  as instructed   INSULIN SYRINGE .5CC/30GX1/2" 30G X 1/2" 0.5 ML Misc Check blood sugar TID & QHS   isosorbide-hydrALAZINE 20-37.5 MG tablet Commonly known as: BIDIL Take 2 tablets by mouth 2 (two) times daily.   lisinopril 40 MG tablet Commonly known as: ZESTRIL Take 1 tablet (40 mg total) by mouth daily.   True Metrix Meter w/Device Kit Use as directed   TRUEplus Lancets 28G Misc 1 each by Does not apply route 3 (three) times daily before meals.   accu-chek soft touch lancets Use as instructed   TRUEplus Pen Needles 32G X 4 MM Misc Generic drug: Insulin Pen Needle USE TO INJECT INSULIN AS DIRECTED   Trulicity 1.5 YY/4.8GN Sopn Generic drug: Dulaglutide Inject 1.5 mg into the skin once a week.        ALLERGIES: No Known Allergies   REVIEW OF SYSTEMS: A comprehensive ROS was conducted with the patient and is negative except as per HPI and below:  ROS    OBJECTIVE:   VITAL SIGNS: There were no vitals taken for this visit.   PHYSICAL EXAM:  General: Pt appears well and is in NAD  Hydration: Well-hydrated with moist mucous membranes and good skin turgor  HEENT: Head: Unremarkable with good dentition. Oropharynx clear without exudate.  Eyes: External eye exam normal without stare, lid lag or exophthalmos.  EOM intact.  PERRL.  Neck: General: Supple without adenopathy or carotid bruits. Thyroid: Thyroid size normal.  No goiter or nodules appreciated. No thyroid bruit.  Lungs: Clear with good BS bilat with no rales, rhonchi, or wheezes  Heart: RRR with normal S1 and S2 and no gallops; no murmurs; no rub  Abdomen: Normoactive bowel sounds, soft, nontender, without masses or organomegaly palpable  Extremities:  Lower extremities - No pretibial edema. No lesions.  Skin: Normal texture and temperature to palpation. No rash noted. No Acanthosis nigricans/skin tags. No lipohypertrophy.  Neuro: MS is good with appropriate affect, pt is alert and Ox3    DM foot exam:     DATA REVIEWED:  Lab Results  Component Value Date   HGBA1C 8.1 (A) 06/13/2019   HGBA1C 11.0 (H) 03/13/2019   HGBA1C 11.2 (A) 03/13/2019   Lab Results  Component Value Date   LDLCALC 118 (H) 03/13/2019   CREATININE 2.05 (H) 03/13/2019   Lab Results  Component Value Date   MICRALBCREAT 1,541.9 (H) 01/11/2018    Lab Results  Component Value Date   CHOL 174 03/13/2019   HDL 38 (L) 03/13/2019   LDLCALC 118 (H) 03/13/2019   TRIG 88 03/13/2019   CHOLHDL 4.6 03/13/2019        ASSESSMENT / PLAN / RECOMMENDATIONS:   1) Type 2 Diabetes Mellitus, Sub-Optimally Controlled,  With CKDIII and macrovascular  complications - Most recent A1c of *** %. Goal A1c < 7.5 %.    Plan: GENERAL:  ***  MEDICATIONS:  ***  EDUCATION / INSTRUCTIONS:  BG monitoring instructions: Patient is instructed to check his blood sugars *** times a day, ***.  Call [facility] if: BG persistently < 70 or > 300. . I reviewed the Rule of 15 for the treatment of hypoglycemia in detail with the patient. Literature supplied.   2) Diabetic complications:   Eye: Does *** have known diabetic retinopathy.   Neuro/ Feet: Does *** have known diabetic peripheral neuropathy.  Renal: Patient does *** have known baseline CKD. He is *** on an ACEI/ARB at present.Check urine albumin/creatinine ratio yearly starting at time of diagnosis. If albuminuria is positive, treatment is geared toward better glucose, blood pressure control and use of ACE inhibitors or ARBs. Monitor electrolytes and creatinine once to twice yearly.   3) Lipids: Patient is *** on a statin.    4) Hypertension: ***  at goal of < 140/90 mmHg.       Signed electronically by: Mack Guise, MD  [facility]  [facility] [address] [place], [place] [address] Phone: [phone] FAX: [phone]   CC: Ladell Pier, MD [place] [address] Phone:  [phone]  Fax: [phone]    Return to Endocrinology clinic as below: Future Appointments  Date Time Provider [place]  08/29/2019  1:00 PM Shamleffer, Melanie Crazier, MD [facility] None  11/09/2019  2:10 PM Ladell Pier, MD [facility] None

## 2019-09-26 MED FILL — TRUEPLUS PEN NDL 32GX5/32: 32G X 4 MM | 30 days supply | Qty: 100 | Fill #2

## 2019-09-26 MED FILL — CARVEDILOL 25 MG TABLET: 25 | 90 days supply | Qty: 270 | Fill #0

## 2019-09-26 MED FILL — TRUEPLUS PEN NDL 32GX5/32": 32G X 4 MM | 30 days supply | Qty: 100 | Fill #2

## 2019-11-08 MED FILL — LISINOPRIL 40 MG TABLET: 40 | 30 days supply | Qty: 30 | Fill #1

## 2019-11-09 ENCOUNTER — Ambulatory Visit: Payer: Medicare Other | Attending: Internal Medicine | Admitting: Internal Medicine

## 2019-11-09 ENCOUNTER — Other Ambulatory Visit: Payer: Self-pay

## 2019-11-09 DIAGNOSIS — E1122 Type 2 diabetes mellitus with diabetic chronic kidney disease: Secondary | ICD-10-CM | POA: Diagnosis not present

## 2019-11-09 DIAGNOSIS — Z794 Long term (current) use of insulin: Secondary | ICD-10-CM

## 2019-11-09 DIAGNOSIS — I480 Paroxysmal atrial fibrillation: Secondary | ICD-10-CM

## 2019-11-09 DIAGNOSIS — I5022 Chronic systolic (congestive) heart failure: Secondary | ICD-10-CM | POA: Diagnosis not present

## 2019-11-09 DIAGNOSIS — I130 Hypertensive heart and chronic kidney disease with heart failure and stage 1 through stage 4 chronic kidney disease, or unspecified chronic kidney disease: Secondary | ICD-10-CM | POA: Diagnosis not present

## 2019-11-09 DIAGNOSIS — F10230 Alcohol dependence with withdrawal, uncomplicated: Secondary | ICD-10-CM

## 2019-11-09 DIAGNOSIS — R569 Unspecified convulsions: Secondary | ICD-10-CM

## 2019-11-09 DIAGNOSIS — E11319 Type 2 diabetes mellitus with unspecified diabetic retinopathy without macular edema: Secondary | ICD-10-CM

## 2019-11-09 DIAGNOSIS — Z1211 Encounter for screening for malignant neoplasm of colon: Secondary | ICD-10-CM

## 2019-11-09 DIAGNOSIS — N1832 Chronic kidney disease, stage 3b: Secondary | ICD-10-CM | POA: Diagnosis not present

## 2019-11-09 DIAGNOSIS — E1121 Type 2 diabetes mellitus with diabetic nephropathy: Secondary | ICD-10-CM

## 2019-11-09 DIAGNOSIS — I10 Essential (primary) hypertension: Secondary | ICD-10-CM

## 2019-11-09 NOTE — Progress Notes (Signed)
Virtual Visit via Telephone Note Visit with change to televisit due to icy weather conditions and our facility being closed today because of it.  I connected with Timothy Lambert on 11/09/19 at 3:15 p.m EST by telephone and verified that I am speaking with the correct person using two identifiers. I am at home.  The patient is at home.  Only the patient and myself participated in this encounter.  I discussed the limitations, risks, security and privacy concerns of performing an evaluation and management service by telephone and the availability of in person appointments. I also discussed with the patient that there may be a patient responsible charge related to this service. The patient expressed understanding and agreed to proceed.   History of Present Illness: Pt with hx of DM, HTN, HL, CAD with DES LAD 2016,embolicCVA, PAF (thought to be a poor candidate for anticoagulation because of history of EtOH abuse at the time of his CVA in 2016) ETOH withdrawal sz, CKD stage 3, macroalbuminuria.  Last seen 11/20.  Today's visit is for chronic ds management.  DIABETES TYPE 2 Last A1C:   Results for orders placed or performed in visit on 08/07/19  Glucose (CBG)  Result Value Ref Range   POC Glucose 137 (A) 70 - 99 mg/dl    Med Adherence:  [x]  Yes on Lantus and Trucility   []  No Medication side effects:  []  Yes    [x]  No Home Monitoring?  [x]  Yes  Three times a week in morning and afternoon Home glucose results range:120-140s Diet Adherence: [x]  Yes - dec appetite with Trulicity.  Not much wgh loss though Exercise: [x]  Yes -works a lot in his yard and just got a stationary bike but not using as yet Hypoglycemic episodes?: []  Yes    [x]  No Numbness of the feet? []  Yes    [x]  No Retinopathy hx? [x]  Yes    []  No Last eye exam:  Over due for eye exam.  Referral was submitted for this last year.  He states he was called by a practice from [place] but he decided he will go back to the physician  that he was seeing in [place].  He plans to schedule with doctor in [place] Comments:   HYPERTENSION/CAD/sys CHF/HL Currently taking: see medication list Med Adherence: []  Yes    [x]  No -out of Bidil x 2-3 mths because co-pay cost went up. Reports compliant with Coreg, and Lisinopril.  Lasix d/c by nephrologist on last visit in 08/2019.  Taking Lipitor Medication side effects: []  Yes    [x]  No Adherence with salt restriction: [x]  Yes    []  No Home Monitoring?: []  Yes    [x]  No but now has a device Monitoring Frequency: []  Yes    []  No Home BP results range: []  Yes    []  No SOB? []  Yes    [x]  No Chest Pain?: []  Yes    [x]  No Leg swelling?: []  Yes    [x]  No Headaches?: []  Yes    [x]  No Dizziness? []  Yes    [x]  No Comments:  No palpitations.  Patient with ejection fraction in the 30s on echo done 01/2015.  However repeat echo done 3 months later revealed EF of 50-55%.  CKD stage 3 with macroalbumin: Reportedly saw his nephrologist since last visit with me and was told kidney function no worse Has f/u appy in April.  Still making good urine.  Hx of Sz:  Had sz when he had MI  in 2016.  Thought to be w/drawal sz from ETOH.  No sz since.  Stop drinking since then  HM; over due for eye exam and colon cancer screening.  Still has FIT test that he has not used as yet.  He tells me he will turn it in since "I'm being so persistent."   Does not want c-scope Outpatient Encounter Medications as of 11/09/2019  Medication Sig  . aspirin EC 81 MG tablet Take 1 tablet (81 mg total) by mouth daily.  Marland Kitchen atorvastatin (LIPITOR) 40 MG tablet Take 1 tablet (40 mg total) by mouth daily at 6 PM.  . Blood Glucose Monitoring Suppl (TRUE METRIX METER) w/Device KIT Use as directed  . carvedilol (COREG) 25 MG tablet Take 1.5 tablets (37.5 mg total) by mouth 2 (two) times daily with a meal.  . Dulaglutide (TRULICITY) 1.5 UX/3.2GM SOPN Inject 1.5 mg into the skin once a week.  . furosemide (LASIX) 20 MG tablet Take  1 tablet (20 mg total) by mouth daily. Needs appt for more refills.  Marland Kitchen glucose blood (TRUE METRIX BLOOD GLUCOSE TEST) test strip Use as instructed  . Insulin Glargine (BASAGLAR KWIKPEN) 100 UNIT/ML SOPN Inject 0.3 mLs (30 Units total) into the skin at bedtime.  . Insulin Syringe-Needle U-100 (INSULIN SYRINGE .5CC/30GX1/2") 30G X 1/2" 0.5 ML MISC Check blood sugar TID & QHS  . isosorbide-hydrALAZINE (BIDIL) 20-37.5 MG tablet Take 2 tablets by mouth 2 (two) times daily.  . Lancets (ACCU-CHEK SOFT TOUCH) lancets Use as instructed  . lisinopril (ZESTRIL) 40 MG tablet Take 1 tablet (40 mg total) by mouth daily.  . TRUEPLUS LANCETS 28G MISC 1 each by Does not apply route 3 (three) times daily before meals.  . TRUEPLUS PEN NEEDLES 32G X 4 MM MISC USE TO INJECT INSULIN AS DIRECTED   No facility-administered encounter medications on file as of 11/09/2019.    Observations/Objective: Results for orders placed or performed in visit on 08/07/19  Glucose (CBG)  Result Value Ref Range   POC Glucose 137 (A) 70 - 99 mg/dl     Chemistry      Component Value Date/Time   NA 139 03/13/2019 1639   K 4.7 03/13/2019 1639   CL 104 03/13/2019 1639   CO2 21 03/13/2019 1639   BUN 33 (H) 03/13/2019 1639   CREATININE 2.05 (H) 03/13/2019 1639   CREATININE 1.19 09/06/2015 1133      Component Value Date/Time   CALCIUM 9.6 03/13/2019 1639   ALKPHOS 86 03/13/2019 1639   AST 17 03/13/2019 1639   ALT 22 03/13/2019 1639   BILITOT 0.4 03/13/2019 1639       Assessment and Plan: 1. Type 2 diabetes mellitus with diabetic nephropathy, with long-term current use of insulin (HCC) Blood sugars are acceptable.  He will continue Trulicity and current dose of Lantus. Encouraged him to use his bike at least 3-4 times a week with goal of getting up to 30 minutes.  He will start low and go slow.  Continue healthy eating habits - Hemoglobin A1c; Future - Microalbumin / creatinine urine ratio; Future  2. Diabetic  retinopathy associated with type 2 diabetes mellitus, macular edema presence unspecified, unspecified laterality, unspecified retinopathy severity ([place]) Given his history of retinopathy, I strongly encouraged him to do his follow-up visit with the ophthalmologist.  He tells me he will call back with the name of the ophthalmologist so that I can submit the referral  3. Essential hypertension Most likely not at goal given  that he has been out of BiDil due to co-pay cost.  I told him that we can change the BiDil to the individual components which may have cheaper co-pay.  However patient tells me that he will stay with the BiDil for now as he does have the funds to get the medication.  I will still have our clinical pharmacist see whether the co-pay would be less for the individual components. -He now has a home blood pressure monitoring device.  Encouraged him to check his blood pressure at least twice a week with goal being 130/80 or lower. - Basic Metabolic Panel; Future  4. Chronic systolic CHF (congestive heart failure) ([place]) This has not been an issue for him and EF had increased on last echo.  He is no longer on furosemide.  Continue other medications and low-salt diet  5. Paroxysmal atrial fibrillation (HCC) Not on anticoagulation for reasons stated above.  Denies any palpitations  6. 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  Patient has discontinued drinking completely several years ago  7. Stage 3b chronic kidney disease Followed by nephrology.  8. Screening for colon cancer Patient promises to use the fit test and mail it in for [place]. If not done by next visit, I will accept this as declined t do it and will update his HM as such   Follow Up Instructions: 3 mths   I discussed the assessment and treatment plan with the patient. The patient was provided an opportunity to ask questions and all were answered. The patient agreed with the plan and  demonstrated an understanding of the instructions.   The patient was advised to call back or seek an in-person evaluation if the symptoms worsen or if the condition fails to improve as anticipated.  I provided 21 minutes of non-face-to-face time during this encounter.   Karle Plumber, MD

## 2019-11-10 ENCOUNTER — Telehealth: Payer: Self-pay | Admitting: Internal Medicine

## 2019-11-10 NOTE — Telephone Encounter (Signed)
-----   Message from Tresa Endo, RPH-CPP sent at 11/10/2019 10:01 AM EST ----- Ran test scripts for isosorbide dinitrate and hydralazine. Copays for 30-day supply are $8 each for a total of $16.  ----- Message ----- From: Ladell Pier, MD Sent: 11/09/2019   5:54 PM EST To: Tresa Endo, RPH-CPP  This patient is on BiDil.  He tells me that his co-pay increased from $40 to $100.  Is there any way you can run the separate components of the BiDil to see whether his co-pay would be less?

## 2019-11-14 MED ORDER — HYDRALAZINE HCL 50 MG PO TABS: 75.0000 mg | ORAL_TABLET | Freq: Two times a day (BID) | ORAL | 6 refills | Status: DC

## 2019-11-14 MED ORDER — ISOSORBIDE DINITRATE 40 MG PO TABS: 40.0000 mg | ORAL_TABLET | Freq: Two times a day (BID) | ORAL | 6 refills | Status: DC

## 2019-11-14 NOTE — Telephone Encounter (Signed)
BiDil 20/37.5 2 tablets twice a day has been changed to isosorbide dinitrate 40 mg twice a day and hydralazine 50 mg 1.5 tablets twice a day.

## 2019-11-14 NOTE — Telephone Encounter (Signed)
Contacted pt to go over Dr. Wynetta Emery message pt states he understands and he will like the rx sent into 2 . Pt states he will pay the $16 dollars. Pt states he would like rx sent to [facility]

## 2019-11-15 MED FILL — hydrALAZINE HCL 50 MG TABS: 50 | 30 days supply | Qty: 90 | Fill #0

## 2019-11-17 ENCOUNTER — Other Ambulatory Visit: Payer: Self-pay

## 2019-11-17 ENCOUNTER — Ambulatory Visit: Payer: Medicare Other | Attending: Internal Medicine

## 2019-11-17 DIAGNOSIS — Z794 Long term (current) use of insulin: Secondary | ICD-10-CM | POA: Diagnosis not present

## 2019-11-17 DIAGNOSIS — I10 Essential (primary) hypertension: Secondary | ICD-10-CM | POA: Diagnosis not present

## 2019-11-17 DIAGNOSIS — E1121 Type 2 diabetes mellitus with diabetic nephropathy: Secondary | ICD-10-CM

## 2019-11-18 LAB — HEMOGLOBIN A1C
Est. average glucose Bld gHb Est-mCnc: 223 mg/dL
Hgb A1c MFr Bld: 9.4 % — ABNORMAL HIGH (ref 4.8–5.6)

## 2019-11-18 LAB — BASIC METABOLIC PANEL
BUN/Creatinine Ratio: 17 (ref 10–24)
BUN: 38 mg/dL — ABNORMAL HIGH (ref 8–27)
CO2: 22 mmol/L (ref 20–29)
Calcium: 8.7 mg/dL (ref 8.6–10.2)
Chloride: 105 mmol/L (ref 96–106)
Creatinine, Ser: 2.3 mg/dL — ABNORMAL HIGH (ref 0.76–1.27)
GFR calc Af Amer: 34 mL/min/{1.73_m2} — ABNORMAL LOW (ref >59)
GFR calc non Af Amer: 29 mL/min/{1.73_m2} — ABNORMAL LOW (ref >59)
Glucose: 197 mg/dL — ABNORMAL HIGH (ref 65–99)
Potassium: 4.7 mmol/L (ref 3.5–5.2)
Sodium: 140 mmol/L (ref 134–144)

## 2019-11-22 ENCOUNTER — Other Ambulatory Visit: Payer: Self-pay | Admitting: Internal Medicine

## 2019-11-22 MED ORDER — BASAGLAR KWIKPEN 100 UNIT/ML ~~LOC~~ SOPN: 30.0000 [IU] | PEN_INJECTOR | Freq: Every day | SUBCUTANEOUS | 6 refills | Status: DC

## 2019-11-22 MED ORDER — TRULICITY 1.5 MG/0.5ML ~~LOC~~ SOAJ: 1.5000 mg | SUBCUTANEOUS | 6 refills | Status: DC

## 2019-11-22 MED FILL — TRULICITY 1.5 MG/0.5 ML PEN: 1.5 | 30 days supply | Qty: 2 | Fill #0

## 2019-11-22 MED FILL — LANTUS SOLOSTAR 100 UNITS/M: 100 | 30 days supply | Qty: 9 | Fill #0

## 2019-11-22 NOTE — Progress Notes (Signed)
Let patient know that his A1C is 9.4 up from 8.1 in September 2020.  This means his diabetes is not control.  Goal is to be less than 7.  I have made some inquires and looks like he has not refill Trulicity  and Lantus since 07/2019.  I have sent refills to our pharmacy.  His kidney function is still abnormal but stable.

## 2019-11-24 ENCOUNTER — Telehealth: Payer: Self-pay

## 2019-11-24 NOTE — Telephone Encounter (Signed)
Contacted pt to go over lab results pt is aware and doesn't have questions or concerns

## 2019-11-27 ENCOUNTER — Telehealth: Payer: Self-pay

## 2019-11-27 DIAGNOSIS — H35033 Hypertensive retinopathy, bilateral: Secondary | ICD-10-CM | POA: Diagnosis not present

## 2019-11-27 DIAGNOSIS — H2513 Age-related nuclear cataract, bilateral: Secondary | ICD-10-CM | POA: Diagnosis not present

## 2019-11-27 DIAGNOSIS — H3582 Retinal ischemia: Secondary | ICD-10-CM | POA: Diagnosis not present

## 2019-11-27 DIAGNOSIS — E113513 Type 2 diabetes mellitus with proliferative diabetic retinopathy with macular edema, bilateral: Secondary | ICD-10-CM | POA: Diagnosis not present

## 2019-11-27 MED ORDER — ISOSORBIDE DINITRATE 20 MG PO TABS: 40.0000 mg | ORAL_TABLET | Freq: Two times a day (BID) | ORAL | 2 refills | Status: DC

## 2019-11-27 MED FILL — ATORVASTATIN CALCIUM 40 MG: 40 | 90 days supply | Qty: 90 | Fill #4

## 2019-11-27 MED FILL — ISOSORBIDE DN 20 MG TABLET: 20 | 30 days supply | Qty: 120 | Fill #0

## 2019-11-27 NOTE — Telephone Encounter (Signed)
Rx sent 

## 2019-11-27 NOTE — Addendum Note (Signed)
Addended by: Daisy Blossom, Annie Main L on: 11/27/2019 02:14 PM   Modules accepted: Orders

## 2019-11-27 NOTE — Telephone Encounter (Signed)
PT'S INSURANCE WILL PAY FOR ISOSORBIDE DINITRATE 20MG -2 BID.  CAN YOU RESEND A SCRIPT CHANGE FROM THE 40 MG TO THE 20MG ?

## 2019-12-06 MED FILL — LISINOPRIL 40 MG TABLET: 40 | 90 days supply | Qty: 90 | Fill #0

## 2019-12-26 DIAGNOSIS — E113512 Type 2 diabetes mellitus with proliferative diabetic retinopathy with macular edema, left eye: Secondary | ICD-10-CM | POA: Diagnosis not present

## 2020-01-12 ENCOUNTER — Other Ambulatory Visit: Payer: Self-pay | Admitting: Internal Medicine

## 2020-01-12 DIAGNOSIS — E1165 Type 2 diabetes mellitus with hyperglycemia: Secondary | ICD-10-CM

## 2020-01-12 MED FILL — TRUEPLUS PEN NDL 32GX5/32: 32G X 4 MM | 90 days supply | Qty: 100 | Fill #0

## 2020-01-12 MED FILL — LANTUS SOLOSTAR 100 UNITS/M: 100 | 30 days supply | Qty: 9 | Fill #0

## 2020-01-23 DIAGNOSIS — E113511 Type 2 diabetes mellitus with proliferative diabetic retinopathy with macular edema, right eye: Secondary | ICD-10-CM | POA: Diagnosis not present

## 2020-01-30 DIAGNOSIS — R809 Proteinuria, unspecified: Secondary | ICD-10-CM | POA: Diagnosis not present

## 2020-01-30 DIAGNOSIS — I129 Hypertensive chronic kidney disease with stage 1 through stage 4 chronic kidney disease, or unspecified chronic kidney disease: Secondary | ICD-10-CM | POA: Diagnosis not present

## 2020-01-30 DIAGNOSIS — E1122 Type 2 diabetes mellitus with diabetic chronic kidney disease: Secondary | ICD-10-CM | POA: Diagnosis not present

## 2020-01-30 DIAGNOSIS — N183 Chronic kidney disease, stage 3 unspecified: Secondary | ICD-10-CM | POA: Diagnosis not present

## 2020-01-30 DIAGNOSIS — D631 Anemia in chronic kidney disease: Secondary | ICD-10-CM | POA: Diagnosis not present

## 2020-02-08 ENCOUNTER — Other Ambulatory Visit: Payer: Self-pay

## 2020-02-08 ENCOUNTER — Other Ambulatory Visit: Payer: Self-pay | Admitting: Internal Medicine

## 2020-02-08 ENCOUNTER — Ambulatory Visit: Payer: Medicare Other | Attending: Internal Medicine | Admitting: Internal Medicine

## 2020-02-08 ENCOUNTER — Encounter: Payer: Self-pay | Admitting: Internal Medicine

## 2020-02-08 VITALS — BP 190/90 | HR 82 | Temp 97.7°F | Resp 16 | Wt 180.2 lb

## 2020-02-08 DIAGNOSIS — I10 Essential (primary) hypertension: Secondary | ICD-10-CM | POA: Diagnosis not present

## 2020-02-08 DIAGNOSIS — N1832 Chronic kidney disease, stage 3b: Secondary | ICD-10-CM

## 2020-02-08 DIAGNOSIS — E11319 Type 2 diabetes mellitus with unspecified diabetic retinopathy without macular edema: Secondary | ICD-10-CM

## 2020-02-08 DIAGNOSIS — E1121 Type 2 diabetes mellitus with diabetic nephropathy: Secondary | ICD-10-CM | POA: Diagnosis not present

## 2020-02-08 DIAGNOSIS — Z794 Long term (current) use of insulin: Secondary | ICD-10-CM

## 2020-02-08 DIAGNOSIS — I2510 Atherosclerotic heart disease of native coronary artery without angina pectoris: Secondary | ICD-10-CM

## 2020-02-08 DIAGNOSIS — I480 Paroxysmal atrial fibrillation: Secondary | ICD-10-CM

## 2020-02-08 LAB — POCT GLYCOSYLATED HEMOGLOBIN (HGB A1C): HbA1c, POC (controlled diabetic range): 9.3 % — AB (ref 0.0–7.0)

## 2020-02-08 LAB — GLUCOSE, POCT (MANUAL RESULT ENTRY): POC Glucose: 197 mg/dl — AB (ref 70–99)

## 2020-02-08 MED ORDER — BASAGLAR KWIKPEN 100 UNIT/ML ~~LOC~~ SOPN: 32.0000 [IU] | PEN_INJECTOR | Freq: Every day | SUBCUTANEOUS | 6 refills | Status: DC

## 2020-02-08 NOTE — Patient Instructions (Signed)
Please give patient an appointment with the clinical pharmacist in 1 week for repeat blood pressure check.  Please bring all of your medicines with you when you come to see the clinical pharmacist in 1 week for repeat blood pressure check.  Please check the printed medication list that was given to you today against the bottles that you have at home to figure out which one of the blood pressure medications you do not have and needs to refill.  Increase your insulin to 32 units daily.  I have referred you to an endocrinologist to help with management of your diabetes.

## 2020-02-08 NOTE — Progress Notes (Signed)
Patient ID: Timothy Lambert, male    DOB: 08-24-1956  MRN: [id number]  CC: Diabetes and Hypertension   Subjective: Timothy Lambert is a 64 y.o. male who presents for chronic ds management His concerns today include:  Pt with hx of DM with retinopathy and nephropathy, HTN, HL, CAD with DES LAD 2016,embolicCVA, PAF (thought to be a poor candidate for anticoagulation because of history of EtOH abuse at the time of his CVA in 2016) ETOH withdrawal sz, CKD stage 3, macroalbuminuria  HM:  No COVID vaccine as yet.  Has not done fit test.  DIABETES TYPE 2 Last A1C:   Results for orders placed or performed in visit on 02/08/20  POCT glucose (manual entry)  Result Value Ref Range   POC Glucose 197 (A) 70 - 99 mg/dl  POCT glycosylated hemoglobin (Hb A1C)  Result Value Ref Range   Hemoglobin A1C     HbA1c POC (<> result, manual entry)     HbA1c, POC (prediabetic range)     HbA1c, POC (controlled diabetic range) 9.3 (A) 0.0 - 7.0 %    Med Adherence:  _0  Yes    <[id number]>_1<\/HERDEYCXKGYJEHUD>_1  No "Trulicity bothers my stomach." Stopped taking mths ago.   reports compliance with Glargine insulin 30 units daily Medication side effects:  _2  Yes    _3  No Home Monitoring?  _4  Yes  Q 2-3 days  _5  No Home glucose results range: 150-170 before meeals Diet Adherence: "I don't eat like I use to." Exercise: _6  Yes  -does a lot of yard work Hypoglycemic episodes?: _7  Yes    _8  No Numbness of the feet? _9  Yes    _10  No Retinopathy hx? _11  Yes    _12  No Last eye exam:  Had eye exam 11/27/19, Dr. Pilar Plate in [place].  Seen twice since then.  Had treatment on LT eye then RT eye Comments:   HYPERTENSION Currently taking: see medication list.  Did not bring meds with him today.  Saw the nephrologist about a week ago and reports that amlodipine 10 mg was added.  We had change the BiDil to the separate components of isosorbide and hydralazine as the separate components were cheaper than the BiDil.  However he tells me that he did not  get one of the components and he does not know which one.  He is not sure that he is taking the furosemide.  He thinks the nephrologist may have taken him off of it. Med Adherence: _13  Yes    _14  No Medication side effects: _15  Yes    _16  No Adherence with salt restriction: _17  Yes    _18  No Home Monitoring?: _19  Yes    _20  No Monitoring Frequency: _21  Yes    _22  No Home BP results range: _23  Yes    _24  No SOB? _25  Yes    _26  No Chest Pain?: _27  Yes    _28  No Leg swelling?: _29  Yes    _30  No Headaches?: _31  Yes    _32  No Dizziness? _33  Yes    _34  No Comments: Past history of paroxysmal atrial fibrillation diagnosed in 2016.  Thought to be a poor candidate for anticoagulation due to ongoing EtOH use at that time.  Patient denies any palpitations at this time.  CKD3:  Saw nephrologist 1 wk ago.  Had blood tests done .  Norvasc 10 mg added. Still making good urine    Patient Active Problem List   Diagnosis Date Noted  . CKD (chronic kidney  disease) stage 3, GFR 30-59 ml/min 02/15/2018  . Macroalbuminuric diabetic nephropathy ([place]) 02/15/2018  . Diabetic retinopathy ([facility]) 11/18/2015  . Paroxysmal atrial fibrillation ([place]) 10/28/2015  . Cognitive deficit, post-stroke 05/13/2015  . Essential hypertension 02/19/2015  . CAD (coronary artery disease) 02/19/2015  . Embolic cerebral infarction ([place]) 02/01/2015  . CVA (cerebral vascular accident) ([place])   . Combined systolic and diastolic congestive heart failure ([place])   . Diabetes type 2, uncontrolled ([place])   . Seizure [facility])      Current Outpatient Medications on File Prior to Visit  Medication Sig Dispense Refill  . amLODipine (NORVASC) 10 MG tablet Take 10 mg by mouth daily.    Marland Kitchen aspirin EC 81 MG tablet Take 1 tablet (81 mg total) by mouth daily. 100 tablet 1  . atorvastatin (LIPITOR) 40 MG tablet Take 1 tablet (40 mg total) by mouth daily at 6 PM. 90 tablet 3  . Blood Glucose Monitoring Suppl (TRUE METRIX METER) w/Device KIT Use as directed 1 kit 0    . carvedilol (COREG) 25 MG tablet Take 1.5 tablets (37.5 mg total) by mouth 2 (two) times daily with a meal. 90 tablet 5  . furosemide (LASIX) 20 MG tablet Take 1 tablet (20 mg total) by mouth daily. Needs appt for more refills. 30 tablet 6  . glucose blood (TRUE METRIX BLOOD GLUCOSE TEST) test strip Use as instructed 100 each 12  . hydrALAZINE (APRESOLINE) 50 MG tablet Take 1.5 tablets (75 mg total) by mouth 2 (two) times daily. 90 tablet 6  . Insulin Syringe-Needle U-100 (INSULIN SYRINGE .5CC/30GX1/2") 30G X 1/2" 0.5 ML MISC Check blood sugar TID & QHS 100 each 2  . isosorbide dinitrate (ISORDIL) 20 MG tablet Take 2 tablets (40 mg total) by mouth 2 (two) times daily. 120 tablet 2  . Lancets (ACCU-CHEK SOFT TOUCH) lancets Use as instructed 100 each 12  . lisinopril (ZESTRIL) 40 MG tablet Take 1 tablet (40 mg total) by mouth daily. 30 tablet 3  . TRUEPLUS LANCETS 28G MISC 1 each by Does not apply route 3 (three) times daily before meals. 100 each 12  . TRUEPLUS PEN NEEDLES 32G X 4 MM MISC USE TO INJECT INSULIN AS DIRECTED 100 each 5   No current facility-administered medications on file prior to visit.    No Known Allergies  Social History   Socioeconomic History  . Marital status: Married    Spouse name: Not on file  . Number of children: Not on file  . Years of education: Not on file  . Highest education level: Not on file  Occupational History  . Not on file  Tobacco Use  . Smoking status: Former Smoker    Packs/day: 0.33    Years: 14.00    Pack years: 4.62    Types: Cigarettes  . Smokeless tobacco: Never Used  . Tobacco comment: "quit smoking cigarettes in 1992"  Substance and Sexual Activity  . Alcohol use: No    Comment: 02/19/2015 "stopped drinking 01/22/2015"  . Drug use: No  . Sexual activity: Not Currently  Other Topics Concern  . Not on file  Social History Narrative   Lives   Caffeine use: Coffee sometimes   Social Determinants of Health   Financial Resource  Strain:   . Difficulty of Paying Living Expenses:   Food Insecurity:   . Worried About Charity fundraiser in the Last Year:   . Arboriculturist in the Last Year:   Transportation Needs:   .  Lack of Transportation (Medical):   Marland Kitchen Lack of Transportation (Non-Medical):   Physical Activity:   . Days of Exercise per Week:   . Minutes of Exercise per Session:   Stress:   . Feeling of Stress :   Social Connections:   . Frequency of Communication with Friends and Family:   . Frequency of Social Gatherings with Friends and Family:   . Attends Religious Services:   . Active Member of Clubs or Organizations:   . Attends Archivist Meetings:   Marland Kitchen Marital Status:   Intimate Partner Violence:   . Fear of Current or Ex-Partner:   . Emotionally Abused:   Marland Kitchen Physically Abused:   . Sexually Abused:     Family History  Problem Relation Age of Onset  . Diabetes Father   . Seizures Neg Hx     Past Surgical History:  Procedure Laterality Date  . CARDIAC CATHETERIZATION N/A 01/23/2015   Procedure: Left Heart Cath and Coronary Angiography;  Surgeon: [name], MD;  Location: [facility] INVASIVE CV LAB CUPID;  Service: Cardiovascular;  Laterality: N/A;  . CARDIAC CATHETERIZATION N/A 01/23/2015   Procedure: Coronary Stent Intervention;  Surgeon: [name], MD;  Location: [facility] INVASIVE CV LAB CUPID;  Service: Cardiovascular;  Laterality: N/A;    ROS: Review of Systems Negative except as stated above  PHYSICAL EXAM: BP (!) 190/90   Pulse 82   Temp 97.7 F (36.5 C)   Resp 16   Wt 180 lb 3.2 oz (81.7 kg)   SpO2 97%   BMI 29.09 kg/m   Physical Exam  General appearance - alert, well appearing, and in no distress Mental status - normal mood, behavior, speech, dress, motor activity, and thought processes Neck - supple, no significant adenopathy Chest - clear to auscultation, no wheezes, rales or rhonchi, symmetric air entry Heart - normal rate, regular rhythm, normal S1, S2, no  murmurs, rubs, clicks or gallops Extremities -trace to 1+ bilateral lower extremity edema.  CMP Latest Ref Rng & Units 11/17/2019 03/13/2019 12/10/2017  Glucose 65 - 99 mg/dL 197(H) 242(H) 111(H)  BUN 8 - 27 mg/dL 38(H) 33(H) 22  Creatinine 0.76 - 1.27 mg/dL 2.30(H) 2.05(H) 1.82(H)  Sodium 134 - 144 mmol/L 140 139 144  Potassium 3.5 - 5.2 mmol/L 4.7 4.7 4.0  Chloride 96 - 106 mmol/L 105 104 107(H)  CO2 20 - 29 mmol/L _0 Calcium 8.6 - 10.2 mg/dL 8.7 9.6 8.5(L)  Total Protein 6.0 - 8.5 g/dL - 6.6 6.1  Total Bilirubin 0.0 - 1.2 mg/dL - 0.4 0.2  Alkaline Phos 39 - 117 IU/L - 86 68  AST 0 - 40 IU/L - 17 18  ALT 0 - 44 IU/L - 22 22   Lipid Panel     Component Value Date/Time   CHOL 174 03/13/2019 1639   TRIG 88 03/13/2019 1639   HDL 38 (L) 03/13/2019 1639   CHOLHDL 4.6 03/13/2019 1639   CHOLHDL 3.0 04/25/2015 1216   VLDL 17 04/25/2015 1216   LDLCALC 118 (H) 03/13/2019 1639    CBC    Component Value Date/Time   WBC 6.8 03/13/2019 1639   WBC 8.8 04/02/2015 1014   RBC 4.51 03/13/2019 1639   RBC 3.45 (L) 04/02/2015 1014   HGB 13.8 03/13/2019 1639   HCT 40.1 03/13/2019 1639   PLT 194 03/13/2019 1639   MCV 89 03/13/2019 1639   MCH 30.6 03/13/2019 1639   MCH 30.0 02/20/2015 0509   MCHC 34.4  03/13/2019 1639   MCHC 33.5 04/02/2015 1014   RDW 13.5 03/13/2019 1639   LYMPHSABS 1.7 02/19/2015 2333   MONOABS 0.5 02/19/2015 2333   EOSABS 0.6 02/19/2015 2333   BASOSABS 0.0 02/19/2015 2333    ASSESSMENT AND PLAN: 1. Type 2 diabetes mellitus with diabetic nephropathy, with long-term current use of insulin (HCC) Not at goal.  Stop Trulicity since it was causing some GI upset for him.  Increase glargine insulin to 32 units daily.  Refer to endocrinology to help with management.  We will limited in terms of being able to add any oral agent due to his kidney function - POCT glucose (manual entry) - POCT glycosylated hemoglobin (Hb A1C) - Ambulatory referral to Endocrinology -  Insulin Glargine (BASAGLAR KWIKPEN) 100 UNIT/ML; Inject 0.32 mLs (32 Units total) into the skin at bedtime.  Dispense: 18 mL; Refill: 6  2. Diabetic retinopathy of both eyes associated with type 2 diabetes mellitus, macular edema presence unspecified, unspecified retinopathy severity ([place]) Followed by ophthalmology.  Patient reports having had what sounds like laser procedure on both eyes recently  3. Essential hypertension Not at goal.  He thinks he does not have either the isosorbide or the hydralazine.  I have printed an updated list of his medications and gave to him.  I told him to reconcile the list with his medications that he has at home and whichever one he does not have he knows that he needs to get it filled.  Follow-up with clinical pharmacist in 1 week for repeat blood pressure check  4. Stage 3b chronic kidney disease Followed by nephrology.  Will await the results of lab tests that he reports having had done through them a week ago  5. Coronary artery disease involving native coronary artery of native heart without angina pectoris Stable.  Continue aspirin, atorvastatin and carvedilol  6. Paroxysmal atrial fibrillation (HCC) On auscultation he is in sinus rhythm.  Not on anticoagulation for reasons stated above.   Patient encouraged to get the COVID-19 vaccine.  Patient was given the opportunity to ask questions.  Patient verbalized understanding of the plan and was able to repeat key elements of the plan.   Orders Placed This Encounter  Procedures  . Ambulatory referral to Endocrinology  . POCT glucose (manual entry)  . POCT glycosylated hemoglobin (Hb A1C)     Requested Prescriptions   Signed Prescriptions Disp Refills  . Insulin Glargine (BASAGLAR KWIKPEN) 100 UNIT/ML 18 mL 6    Sig: Inject 0.32 mLs (32 Units total) into the skin at bedtime.    Return in about 2 months (around 04/09/2020).  Karle Plumber, MD, FACP

## 2020-02-09 MED FILL — LANTUS SOLOSTAR 100 UNITS/M: 100 | 28 days supply | Qty: 9 | Fill #0

## 2020-02-13 MED FILL — hydrALAZINE HCL 50 MG TABS: 50 | 90 days supply | Qty: 270 | Fill #1

## 2020-02-13 MED FILL — FUROSEMIDE 20 MG TABS: 20 | 90 days supply | Qty: 90 | Fill #2

## 2020-02-13 MED FILL — CARVEDILOL 25 MG TABLET: 25 | 90 days supply | Qty: 270 | Fill #1

## 2020-02-15 ENCOUNTER — Ambulatory Visit: Payer: Medicare Other | Attending: Internal Medicine | Admitting: Pharmacist

## 2020-02-15 ENCOUNTER — Other Ambulatory Visit: Payer: Self-pay

## 2020-02-15 ENCOUNTER — Encounter: Payer: Self-pay | Admitting: Pharmacist

## 2020-02-15 VITALS — BP 195/96 | HR 80

## 2020-02-15 DIAGNOSIS — I10 Essential (primary) hypertension: Secondary | ICD-10-CM

## 2020-02-15 NOTE — Progress Notes (Signed)
   S:    PCP: Dr. Wynetta Emery  Patient arrives in good spirits. Presents to the clinic for hypertension evaluation, counseling, and management.  Patient was referred and last seen by Primary Care Provider on 02/08/2020. Patient denies chest pains or dyspnea. No HA, blurred vision, or dizziness. Denies LE edema.   Medication adherence: reports. However, he did not bring his medications with him today. He has a history of non-compliance. He uses our pharmacy. I have searched our pharmacy records and found the following:   - Amlodipine: picked-up a 30 day supply on 01/30/2020.  - Carvedilol: picked-up in Nov, 2020 for a 30 day supply. - Furosemide: picked-up in Nov, 2020 for a 30 day supply - Hydralazine: never picked-up from our pharmacy  - Isosorbiode: never picked-up from our pharmacy   - Lisinopril: picked up a 3 month supply in March, 2021.  From the above, he only should have current supplies of lisinopril and amlodipine.   Dietary habits include: reports cutting out caffeine; limits salt  Exercise habits include: denies  Family / Social history:  - FHx: DM -Tobacco: former smoker -Alcohol: stopped drinking alcohol in 2016  O: Vitals:   02/15/20 1039  BP: (!) 195/96  Pulse: 80    Home BP readings: reports SBP 170s  Last 3 Office BP readings: BP Readings from Last 3 Encounters:  02/15/20 (!) 195/96  02/08/20 (!) 190/90  08/07/19 (!) 187/95   BMET    Component Value Date/Time   NA 140 11/17/2019 1458   K 4.7 11/17/2019 1458   CL 105 11/17/2019 1458   CO2 22 11/17/2019 1458   GLUCOSE 197 (H) 11/17/2019 1458   GLUCOSE 149 (H) 09/06/2015 1133   BUN 38 (H) 11/17/2019 1458   CREATININE 2.30 (H) 11/17/2019 1458   CREATININE 1.19 09/06/2015 1133   CALCIUM 8.7 11/17/2019 1458   GFRNONAA 29 (L) 11/17/2019 1458   GFRAA 34 (L) 11/17/2019 1458    Renal function: CrCl cannot be calculated (Patient's most recent lab result is older than the maximum 21 days  allowed.).  Clinical ASCVD: Yes  The ASCVD Risk score Mikey Bussing DC Jr., et al., 2013) failed to calculate for the following reasons:   The patient has a prior MI or stroke diagnosis  A/P: Hypertension longstanding currently uncontrolled on current medications. BP Goal = < 130/80 mmHg. Medication adherence is a problem. Of his six medications with BP lowering ability, he appears to only be compliant with two. I have refilled his medications in our pharmacy and he will pick these up and take as prescribed today.  -Continued current regimen.  -Counseled on lifestyle modifications for blood pressure control including reduced dietary sodium, increased exercise, adequate sleep. -Recheck BP in 1 month.   Results reviewed and written information provided.   Total time in face-to-face counseling 15 minutes.   F/U Clinic Visit in 1 month.   Benard Halsted, PharmD, [place] [phone]

## 2020-02-21 ENCOUNTER — Ambulatory Visit: Payer: Medicare Other | Admitting: Internal Medicine

## 2020-02-21 DIAGNOSIS — Z0289 Encounter for other administrative examinations: Secondary | ICD-10-CM

## 2020-02-29 ENCOUNTER — Encounter: Payer: Self-pay | Admitting: Pharmacist

## 2020-02-29 ENCOUNTER — Ambulatory Visit: Payer: Medicare Other | Attending: Internal Medicine | Admitting: Pharmacist

## 2020-02-29 ENCOUNTER — Other Ambulatory Visit: Payer: Self-pay

## 2020-02-29 VITALS — BP 156/84 | HR 84

## 2020-02-29 DIAGNOSIS — I10 Essential (primary) hypertension: Secondary | ICD-10-CM | POA: Diagnosis not present

## 2020-02-29 NOTE — Progress Notes (Signed)
   S:    PCP: Dr. Wynetta Emery  Patient arrives in good spirits. Presents to the clinic for hypertension evaluation, counseling, and management.  Patient was referred and last seen by Primary Care Provider on 02/08/2020. I saw him on 02/15/2020 and found him to be non-adherent. No changes were made to his medications; we encouraged compliance.  Patient denies chest pains or dyspnea. No HA, blurred vision, or dizziness. Denies LE edema.   Medication adherence: reports. Brings all medications with him today. He has everything but isosorbide.  Dietary habits include: reports cutting out caffeine; limits salt  Exercise habits include: denies  Family / Social history:  - FHx: DM -Tobacco: former smoker -Alcohol: stopped drinking alcohol in 2016  O: Vitals:   02/29/20 1524  BP: (!) 156/84  Pulse: 84   Last 3 Office BP readings: BP Readings from Last 3 Encounters:  02/29/20 (!) 156/84  02/15/20 (!) 195/96  02/08/20 (!) 190/90   BMET    Component Value Date/Time   NA 140 11/17/2019 1458   K 4.7 11/17/2019 1458   CL 105 11/17/2019 1458   CO2 22 11/17/2019 1458   GLUCOSE 197 (H) 11/17/2019 1458   GLUCOSE 149 (H) 09/06/2015 1133   BUN 38 (H) 11/17/2019 1458   CREATININE 2.30 (H) 11/17/2019 1458   CREATININE 1.19 09/06/2015 1133   CALCIUM 8.7 11/17/2019 1458   GFRNONAA 29 (L) 11/17/2019 1458   GFRAA 34 (L) 11/17/2019 1458    Renal function: CrCl cannot be calculated (Patient's most recent lab result is older than the maximum 21 days allowed.).  Clinical ASCVD: Yes  The ASCVD Risk score Mikey Bussing DC Jr., et al., 2013) failed to calculate for the following reasons:   The patient has a prior MI or stroke diagnosis  A/P: Hypertension longstanding currently uncontrolled on current medications. BP Goal = < 130/80 mmHg. Medication adherence is improving. He is compliant with his antihypertensives with the exception of isosorbide. I have ordered this in our pharmacy and he will pick this up  today. Compliance encouraged. No med changes today. -Continued current regimen.  -Counseled on lifestyle modifications for blood pressure control including reduced dietary sodium, increased exercise, adequate sleep.  Results reviewed and written information provided.   Total time in face-to-face counseling 15 minutes.   F/U Clinic Visit in 1 month.   Benard Halsted, PharmD, [place] [phone]

## 2020-03-12 MED FILL — LANTUS SOLOSTAR 100 UNITS/M: 100 | 28 days supply | Qty: 9 | Fill #1

## 2020-04-04 MED FILL — LANTUS SOLOSTAR 100 UNITS/M: 100 | 84 days supply | Qty: 27 | Fill #2

## 2020-04-04 MED FILL — ISOSORBIDE DN 20 MG TABLET: 20 | 60 days supply | Qty: 240 | Fill #1

## 2020-04-09 ENCOUNTER — Ambulatory Visit: Payer: Medicare Other | Admitting: Internal Medicine

## 2020-04-19 ENCOUNTER — Other Ambulatory Visit: Payer: Self-pay | Admitting: Internal Medicine

## 2020-04-19 ENCOUNTER — Ambulatory Visit: Payer: Medicare Other | Attending: Internal Medicine | Admitting: Internal Medicine

## 2020-04-19 DIAGNOSIS — E1121 Type 2 diabetes mellitus with diabetic nephropathy: Secondary | ICD-10-CM

## 2020-04-19 DIAGNOSIS — Z794 Long term (current) use of insulin: Secondary | ICD-10-CM | POA: Diagnosis not present

## 2020-04-19 DIAGNOSIS — I10 Essential (primary) hypertension: Secondary | ICD-10-CM

## 2020-04-19 DIAGNOSIS — I2510 Atherosclerotic heart disease of native coronary artery without angina pectoris: Secondary | ICD-10-CM | POA: Diagnosis not present

## 2020-04-19 MED ORDER — AMLODIPINE BESYLATE 10 MG PO TABS: 10.0000 mg | ORAL_TABLET | Freq: Every day | ORAL | 6 refills | Status: DC

## 2020-04-19 MED ORDER — CARVEDILOL 25 MG PO TABS: 37.5000 mg | ORAL_TABLET | Freq: Two times a day (BID) | ORAL | 5 refills | Status: DC

## 2020-04-19 MED FILL — AMLODIPINE BESYLATE 10 MG T: 10 | 30 days supply | Qty: 30 | Fill #0

## 2020-04-19 NOTE — Progress Notes (Signed)
Virtual Visit via Telephone Note Due to current restrictions/limitations of in-office visits due to the COVID-19 pandemic, this scheduled clinical appointment was converted to a telehealth visit  I connected with Timothy Lambert on 04/19/20 at 9:38 a.m by telephone and verified that I am speaking with the correct person using two identifiers. I am in my office.  The patient is at home.  Only the patient and myself participated in this encounter.  I discussed the limitations, risks, security and privacy concerns of performing an evaluation and management service by telephone and the availability of in person appointments. I also discussed with the patient that there may be a patient responsible charge related to this service. The patient expressed understanding and agreed to proceed.   History of Present Illness: Pt with hx of DM with retinopathy and nephropathy, HTN, HL, CAD with DES LAD 2016,embolicCVA, PAF (thought to be a poor candidate for anticoagulation because of history of EtOH abuse at the time of his CVA in 2016) ETOH withdrawal sz, CKD stage 3, macroalbuminuria.  Last seen 02/08/20.  HYPERTENSION/CAD  Currently taking: see medication list.  Since last visit with me, he has been seen by our clinical pharmacist twice for blood pressure recheck.  On his first visit, it was discovered that out of his 6 blood pressure medications he was only taking 2.  The clinical pharmacist made sure that he got refills on all of them.  On his second follow-up visit with the clinical pharmacist, blood pressure had improved.  It was 156/84.  However patient was still out of isosorbide.  Since then he tells me that he has obtained the isosorbide and is taking all of his medicines as prescribed. Med Adherence: [x]  Yes    []  No Medication side effects: []  Yes    [x]  No Adherence with salt restriction: [x]  Yes    []  No Home Monitoring?: []  Yes    [x]  No but does have home blood pressure monitoring  device Monitoring Frequency: []  Yes    []  No Home BP results range: []  Yes    []  No SOB? []  Yes    [x]  No Chest Pain?: []  Yes    [x]  No Leg swelling?: []  Yes    [x]  No Headaches?: []  Yes    [x]  No Dizziness? []  Yes    [x]  No Comments:    DIABETES TYPE 2 Last A1C:   Results for orders placed or performed in visit on 02/08/20  POCT glucose (manual entry)  Result Value Ref Range   POC Glucose 197 (A) 70 - 99 mg/dl  POCT glycosylated hemoglobin (Hb A1C)  Result Value Ref Range   Hemoglobin A1C     HbA1c POC (<> result, manual entry)     HbA1c, POC (prediabetic range)     HbA1c, POC (controlled diabetic range) 9.3 (A) 0.0 - 7.0 %   Referred to endocrine on last visit.  Had appt with Timothy Lambert that was scheduled 02/21/2020.  However patient states he was not aware of that appointment and that no one had called him. Med Adherence:  [x]  Yes  - on 32 units of Glargine insulin Medication side effects:  []  Yes    [x]  No Home Monitoring?  [x]  Yes    []  No Home glucose results range: 140-150s in the mornings before BF. Diet Adherence: [x]  Yes    []  No Exercise: [x]  Yes - works  A lot outside and walks some     Hypoglycemic episodes?: []  Yes    [  x] No Numbness of the feet? []  Yes    []  No Retinopathy hx? [x]  Yes    []  No Last eye exam:  Up to date Comments:   CKD:  Sees the specialist Q 6 mths.  Outpatient Encounter Medications as of 04/19/2020  Medication Sig  . amLODipine (NORVASC) 10 MG tablet Take 10 mg by mouth daily.  Marland Kitchen aspirin EC 81 MG tablet Take 1 tablet (81 mg total) by mouth daily.  Marland Kitchen atorvastatin (LIPITOR) 40 MG tablet Take 1 tablet (40 mg total) by mouth daily at 6 PM.  . Blood Glucose Monitoring Suppl (TRUE METRIX METER) w/Device KIT Use as directed  . carvedilol (COREG) 25 MG tablet Take 1.5 tablets (37.5 mg total) by mouth 2 (two) times daily with a meal.  . furosemide (LASIX) 20 MG tablet Take 1 tablet (20 mg total) by mouth daily. Needs appt for more refills.  Marland Kitchen  glucose blood (TRUE METRIX BLOOD GLUCOSE TEST) test strip Use as instructed  . hydrALAZINE (APRESOLINE) 50 MG tablet Take 1.5 tablets (75 mg total) by mouth 2 (two) times daily.  . Insulin Glargine (BASAGLAR KWIKPEN) 100 UNIT/ML Inject 0.32 mLs (32 Units total) into the skin at bedtime.  . Insulin Syringe-Needle U-100 (INSULIN SYRINGE .5CC/30GX1/2") 30G X 1/2" 0.5 ML MISC Check blood sugar TID & QHS  . isosorbide dinitrate (ISORDIL) 20 MG tablet Take 2 tablets (40 mg total) by mouth 2 (two) times daily.  . Lancets (ACCU-CHEK SOFT TOUCH) lancets Use as instructed  . lisinopril (ZESTRIL) 40 MG tablet Take 1 tablet (40 mg total) by mouth daily.  . TRUEPLUS LANCETS 28G MISC 1 each by Does not apply route 3 (three) times daily before meals.  . TRUEPLUS PEN NEEDLES 32G X 4 MM MISC USE TO INJECT INSULIN AS DIRECTED   No facility-administered encounter medications on file as of 04/19/2020.      Observations/Objective: Results for orders placed or performed in visit on 02/08/20  POCT glucose (manual entry)  Result Value Ref Range   POC Glucose 197 (A) 70 - 99 mg/dl  POCT glycosylated hemoglobin (Hb A1C)  Result Value Ref Range   Hemoglobin A1C     HbA1c POC (<> result, manual entry)     HbA1c, POC (prediabetic range)     HbA1c, POC (controlled diabetic range) 9.3 (A) 0.0 - 7.0 %     Chemistry      Component Value Date/Time   NA 140 11/17/2019 1458   K 4.7 11/17/2019 1458   CL 105 11/17/2019 1458   CO2 22 11/17/2019 1458   BUN 38 (H) 11/17/2019 1458   CREATININE 2.30 (H) 11/17/2019 1458   CREATININE 1.19 09/06/2015 1133      Component Value Date/Time   CALCIUM 8.7 11/17/2019 1458   ALKPHOS 86 03/13/2019 1639   AST 17 03/13/2019 1639   ALT 22 03/13/2019 1639   BILITOT 0.4 03/13/2019 1639        Assessment and Plan: 1. Type 2 diabetes mellitus with diabetic nephropathy, with long-term current use of insulin ([name]) Reported morning blood sugar readings improved but not at goal.   Advised him to increase the glargine insulin to 34 units daily.  Continue to monitor blood sugars.  Continue healthy eating habits and regular exercise.  I have resubmitted the referral for the endocrinologist. - Ambulatory referral to Endocrinology  2. Essential hypertension Commended him on being more compliant with taking his medicines. Encouraged to check blood pressure at least once or twice a  week with goal being 130/80 or lower. - carvedilol (COREG) 25 MG tablet; Take 1.5 tablets (37.5 mg total) by mouth 2 (two) times daily with a meal.  Dispense: 90 tablet; Refill: 5 - amLODipine (NORVASC) 10 MG tablet; Take 1 tablet (10 mg total) by mouth daily.  Dispense: 30 tablet; Refill: 6  3. Coronary artery disease involving native coronary artery of native heart without angina pectoris Clinically stable.  Continue aspirin, Lipitor, isosorbide and carvedilol. - carvedilol (COREG) 25 MG tablet; Take 1.5 tablets (37.5 mg total) by mouth 2 (two) times daily with a meal.  Dispense: 90 tablet; Refill: 5   Follow Up Instructions: 1 mth for Initial Medicare Wellness Visit with my NP.  F/u with me in 3 mths   I discussed the assessment and treatment plan with the patient. The patient was provided an opportunity to ask questions and all were answered. The patient agreed with the plan and demonstrated an understanding of the instructions.   The patient was advised to call back or seek an in-person evaluation if the symptoms worsen or if the condition fails to improve as anticipated.  I provided 13 minutes of non-face-to-face time during this encounter.   Karle Plumber, MD

## 2020-04-26 MED FILL — AMLODIPINE BESYLATE 10 MG T: 10 | 90 days supply | Qty: 90 | Fill #0

## 2020-04-26 MED FILL — CARVEDILOL 25 MG TABLET: 25 | 30 days supply | Qty: 90 | Fill #0

## 2020-05-17 ENCOUNTER — Other Ambulatory Visit: Payer: Self-pay

## 2020-05-17 ENCOUNTER — Encounter: Payer: Self-pay | Admitting: Emergency Medicine

## 2020-05-17 ENCOUNTER — Emergency Department
Admission: EM | Admit: 2020-05-17 | Discharge: 2020-05-17 | Disposition: A | Discharge status: Home / Self Care | Payer: Medicare Other | Attending: Emergency Medicine | Admitting: Emergency Medicine

## 2020-05-17 DIAGNOSIS — R4182 Altered mental status, unspecified: Secondary | ICD-10-CM | POA: Insufficient documentation

## 2020-05-17 DIAGNOSIS — I10 Essential (primary) hypertension: Secondary | ICD-10-CM | POA: Diagnosis not present

## 2020-05-17 DIAGNOSIS — Z743 Need for continuous supervision: Secondary | ICD-10-CM | POA: Diagnosis not present

## 2020-05-17 DIAGNOSIS — Z5321 Procedure and treatment not carried out due to patient leaving prior to being seen by health care provider: Secondary | ICD-10-CM | POA: Diagnosis not present

## 2020-05-17 DIAGNOSIS — R410 Disorientation, unspecified: Secondary | ICD-10-CM | POA: Diagnosis not present

## 2020-05-17 DIAGNOSIS — R6889 Other general symptoms and signs: Secondary | ICD-10-CM | POA: Diagnosis not present

## 2020-05-17 DIAGNOSIS — E162 Hypoglycemia, unspecified: Secondary | ICD-10-CM | POA: Insufficient documentation

## 2020-05-17 DIAGNOSIS — E161 Other hypoglycemia: Secondary | ICD-10-CM | POA: Diagnosis not present

## 2020-05-17 LAB — COMPREHENSIVE METABOLIC PANEL
ALT: 22 U/L (ref 0–44)
AST: 26 U/L (ref 15–41)
Albumin: 4.5 g/dL (ref 3.5–5.0)
Alkaline Phosphatase: 52 U/L (ref 38–126)
Anion gap: 10 (ref 5–15)
BUN: 42 mg/dL — ABNORMAL HIGH (ref 8–23)
CO2: 23 mmol/L (ref 22–32)
Calcium: 9.2 mg/dL (ref 8.9–10.3)
Chloride: 107 mmol/L (ref 98–111)
Creatinine, Ser: 2.39 mg/dL — ABNORMAL HIGH (ref 0.61–1.24)
GFR calc Af Amer: 32 mL/min — ABNORMAL LOW (ref >60)
GFR calc non Af Amer: 28 mL/min — ABNORMAL LOW (ref >60)
Glucose, Bld: 93 mg/dL (ref 70–99)
Potassium: 4.3 mmol/L (ref 3.5–5.1)
Sodium: 140 mmol/L (ref 135–145)
Total Bilirubin: 1 mg/dL (ref 0.3–1.2)
Total Protein: 7.6 g/dL (ref 6.5–8.1)

## 2020-05-17 LAB — CBC
HCT: 38.3 % — ABNORMAL LOW (ref 39.0–52.0)
Hemoglobin: 13.8 g/dL (ref 13.0–17.0)
MCH: 30.1 pg (ref 26.0–34.0)
MCHC: 36 g/dL (ref 30.0–36.0)
MCV: 83.4 fL (ref 80.0–100.0)
Platelets: 193 10*3/uL (ref 150–400)
RBC: 4.59 MIL/uL (ref 4.22–5.81)
RDW: 13.3 % (ref 11.5–15.5)
WBC: 9.5 10*3/uL (ref 4.0–10.5)
nRBC: 0 % (ref 0.0–0.2)

## 2020-05-17 LAB — GLUCOSE, CAPILLARY: Glucose-Capillary: 87 mg/dL (ref 70–99)

## 2020-05-17 NOTE — ED Triage Notes (Signed)
Pt presents to ED via [facility], per pt's wife when she got home pt was "incoherent", pt's wife reports with EMS pt's BP was over 200 and his CBG was 32.  Pt arrives to ED alert and oriented to person, place, time and situation when asked.   20g to L AC placed PTA.

## 2020-05-17 NOTE — ED Notes (Signed)
Wife states he is feeling better  Wishes to go home

## 2020-05-17 NOTE — ED Notes (Signed)
First Nurse note  Per EMS he was found by family altered   FSBS 51 at that time  Was given 200 ml bolus of D10    Also concerned with his b/p being elevated

## 2020-05-28 ENCOUNTER — Ambulatory Visit: Payer: Medicare Other | Attending: Internal Medicine | Admitting: Pharmacist

## 2020-05-28 ENCOUNTER — Other Ambulatory Visit: Payer: Self-pay | Admitting: Internal Medicine

## 2020-05-28 ENCOUNTER — Encounter: Payer: Self-pay | Admitting: Pharmacist

## 2020-05-28 ENCOUNTER — Other Ambulatory Visit: Payer: Self-pay

## 2020-05-28 VITALS — BP 165/80 | HR 85 | Temp 98.6°F | Ht 66.0 in | Wt 177.6 lb

## 2020-05-28 DIAGNOSIS — I10 Essential (primary) hypertension: Secondary | ICD-10-CM

## 2020-05-28 DIAGNOSIS — I5022 Chronic systolic (congestive) heart failure: Secondary | ICD-10-CM

## 2020-05-28 DIAGNOSIS — I2510 Atherosclerotic heart disease of native coronary artery without angina pectoris: Secondary | ICD-10-CM

## 2020-05-28 DIAGNOSIS — Z0000 Encounter for general adult medical examination without abnormal findings: Secondary | ICD-10-CM | POA: Diagnosis not present

## 2020-05-28 MED FILL — LISINOPRIL 40 MG TABLET: 40 | 90 days supply | Qty: 90 | Fill #2

## 2020-05-28 NOTE — Telephone Encounter (Signed)
Requested medication (s) are due for refill today -yes  Requested medication (s) are on the active medication list -yes  Future visit scheduled -yes  Last refill: 02/26/20  Notes to clinic: Patient is over due on labs- fails lab protocol- had upcoming appointment 07/18/20- may need to add labs to appointment for RF- sent for review  Requested Prescriptions  Pending Prescriptions Disp Refills   atorvastatin (LIPITOR) 40 MG tablet [Pharmacy Med Name: ATORVASTATIN CALCIUM 40 MG 40 Tablet] 90 tablet 0    Sig: TAKE 1 TABLET (40 MG TOTAL) BY MOUTH DAILY AT 6 PM.      Cardiovascular:  Antilipid - Statins Failed - 05/28/2020  4:21 PM      Failed - Total Cholesterol in normal range and within 360 days    Cholesterol, Total  Date Value Ref Range Status  03/13/2019 174 100 - 199 mg/dL Final          Failed - LDL in normal range and within 360 days    LDL Calculated  Date Value Ref Range Status  03/13/2019 118 (H) 0 - 99 mg/dL Final          Failed - HDL in normal range and within 360 days    HDL  Date Value Ref Range Status  03/13/2019 38 (L) >39 mg/dL Final          Failed - Triglycerides in normal range and within 360 days    Triglycerides  Date Value Ref Range Status  03/13/2019 88 0 - 149 mg/dL Final          Passed - Patient is not pregnant      Passed - Valid encounter within last 12 months    Recent Outpatient Visits           1 month ago Type 2 diabetes mellitus with diabetic nephropathy, with long-term current use of insulin ([address])   [place], Deborah B, MD   2 months ago Essential hypertension   [place], Jarome Matin, RPH-CPP   3 months ago Essential hypertension   [place], Annie Main L, RPH-CPP   3 months ago Type 2 diabetes mellitus with diabetic nephropathy, with long-term current use of insulin ([place])   [place] Karle Plumber B, MD   6 months ago Type 2 diabetes mellitus with diabetic nephropathy, with long-term current use of insulin ([place])   [place], Deborah B, MD       Future Appointments             In 1 month Ladell Pier, MD [place]                Requested Prescriptions  Pending Prescriptions Disp Refills   atorvastatin (LIPITOR) 40 MG tablet [Pharmacy Med Name: ATORVASTATIN CALCIUM 40 MG 40 Tablet] 90 tablet 0    Sig: TAKE 1 TABLET (40 MG TOTAL) BY MOUTH DAILY AT 6 PM.      Cardiovascular:  Antilipid - Statins Failed - 05/28/2020  4:21 PM      Failed - Total Cholesterol in normal range and within 360 days    Cholesterol, Total  Date Value Ref Range Status  03/13/2019 174 100 - 199 mg/dL Final          Failed - LDL in normal range and within 360 days  LDL Calculated  Date Value Ref Range Status  03/13/2019 118 (H) 0 - 99 mg/dL Final          Failed - HDL in normal range and within 360 days    HDL  Date Value Ref Range Status  03/13/2019 38 (L) >39 mg/dL Final          Failed - Triglycerides in normal range and within 360 days    Triglycerides  Date Value Ref Range Status  03/13/2019 88 0 - 149 mg/dL Final          Passed - Patient is not pregnant      Passed - Valid encounter within last 12 months    Recent Outpatient Visits           1 month ago Type 2 diabetes mellitus with diabetic nephropathy, with long-term current use of insulin ([place])   [place] Ladell Pier, MD   2 months ago Essential hypertension   [place], Jarome Matin, RPH-CPP   3 months ago Essential hypertension   [place], Annie Main L, RPH-CPP   3 months ago Type 2 diabetes mellitus with diabetic nephropathy, with long-term current use of insulin ([place])   [place], Neoma Laming B, MD   6 months ago Type 2 diabetes mellitus with diabetic nephropathy, with long-term current use of insulin [facility])   [place], Deborah B, MD       Future Appointments             In 1 month Wynetta Emery, Dalbert Batman, MD [name]

## 2020-05-28 NOTE — Progress Notes (Signed)
Subjective:   Timothy Lambert is a 64 y.o. male who presents for an Initial Medicare Annual Wellness Visit.   Objective:    Today's Vitals   05/28/20 1607 05/28/20 1609  BP: (!) 165/80   Pulse: 85   Temp: 98.6 F (37 C)   SpO2: 99%   Weight: 177 lb 9.6 oz (80.6 kg)   Height: 5' 6"  (1.676 m)   PainSc: 0-No pain 0-No pain   Body mass index is 28.67 kg/m.  Advanced Directives 05/28/2020 05/17/2020 11/16/2017 12/19/2015 09/06/2015 05/13/2015 04/12/2015  Does Patient Have a Medical Advance Directive? Yes Yes No No No;Yes Yes Yes  Type of Advance Directive Healthcare Power of [place];Living will [place];Living will  Does patient want to make changes to medical advance directive? - No - Patient declined - - - - -  Copy of [place] in Chart? No - copy requested - - - Yes No - copy requested No - copy requested  Would patient like information on creating a medical advance directive? - No - Patient declined No - Patient declined - - - -    Current Medications (verified) Outpatient Encounter Medications as of 05/28/2020  Medication Sig  . amLODipine (NORVASC) 10 MG tablet Take 1 tablet (10 mg total) by mouth daily.  Marland Kitchen aspirin EC 81 MG tablet Take 1 tablet (81 mg total) by mouth daily.  Marland Kitchen atorvastatin (LIPITOR) 40 MG tablet Take 1 tablet (40 mg total) by mouth daily at 6 PM.  . carvedilol (COREG) 25 MG tablet Take 1.5 tablets (37.5 mg total) by mouth 2 (two) times daily with a meal.  . furosemide (LASIX) 20 MG tablet Take 1 tablet (20 mg total) by mouth daily. Needs appt for more refills.  . hydrALAZINE (APRESOLINE) 50 MG tablet Take 1.5 tablets (75 mg total) by mouth 2 (two) times daily.  . Insulin Glargine (BASAGLAR KWIKPEN) 100 UNIT/ML Inject 0.32 mLs (32 Units total) into the skin at bedtime.  . isosorbide dinitrate (ISORDIL) 20 MG tablet Take 2 tablets (40 mg total) by  mouth 2 (two) times daily.  Marland Kitchen lisinopril (ZESTRIL) 40 MG tablet Take 1 tablet (40 mg total) by mouth daily.  . Blood Glucose Monitoring Suppl (TRUE METRIX METER) w/Device KIT Use as directed  . glucose blood (TRUE METRIX BLOOD GLUCOSE TEST) test strip Use as instructed  . Insulin Syringe-Needle U-100 (INSULIN SYRINGE .5CC/30GX1/2") 30G X 1/2" 0.5 ML MISC Check blood sugar TID & QHS  . Lancets (ACCU-CHEK SOFT TOUCH) lancets Use as instructed  . TRUEPLUS LANCETS 28G MISC 1 each by Does not apply route 3 (three) times daily before meals.  . TRUEPLUS PEN NEEDLES 32G X 4 MM MISC USE TO INJECT INSULIN AS DIRECTED   No facility-administered encounter medications on file as of 05/28/2020.    Allergies (verified) Patient has no known allergies.   History: Past Medical History:  Diagnosis Date  . Acute renal failure (ARF) ([place]) 02/19/2015  . CHF (congestive heart failure) ([place])   . Coronary artery disease   . GERD (gastroesophageal reflux disease)   . HLD (hyperlipidemia)   . Hypertension   . Myocardial infarction ([place]) 01/22/2015   "massive"  . Refusal of blood transfusions as patient is Jehovah's Witness   . Seizures ([place]) 01/22/2015   "in front of paramedics"  . Stroke ([place]) 01/22/2015   "short term memory loss & right side weak  since" (02/19/2015)  . Type II diabetes mellitus ([place])    Past Surgical History:  Procedure Laterality Date  . CARDIAC CATHETERIZATION N/A 01/23/2015   Procedure: Left Heart Cath and Coronary Angiography;  Surgeon: [name], MD;  Location[facility] INVASIVE CV LAB CUPID;  Service: Cardiovascular;  Laterality: N/A;  . CARDIAC CATHETERIZATION N/A 01/23/2015   Procedure: Coronary Stent Intervention;  Surgeon: [name], MD;  Location: [facility] INVASIVE CV LAB CUPID;  Service: Cardiovascular;  Laterality: N/A;   Family History  Problem Relation Age of Onset  . Diabetes Father   . Seizures Neg Hx    Social History   Socioeconomic History  . Marital status: Married     Spouse name: Not on file  . Number of children: Not on file  . Years of education: Not on file  . Highest education level: Not on file  Occupational History  . Not on file  Tobacco Use  . Smoking status: Former Smoker    Packs/day: 0.33    Years: 14.00    Pack years: 4.62    Types: Cigarettes  . Smokeless tobacco: Never Used  . Tobacco comment: "quit smoking cigarettes in 1992"  Substance and Sexual Activity  . Alcohol use: No    Comment: 02/19/2015 "stopped drinking 01/22/2015"  . Drug use: No  . Sexual activity: Not Currently  Other Topics Concern  . Not on file  Social History Narrative   Lives   Caffeine use: Coffee sometimes   Social Determinants of Health   Financial Resource Strain:   . Difficulty of Paying Living Expenses: Not on file  Food Insecurity:   . Worried About Charity fundraiser in the Last Year: Not on file  . Ran Out of Food in the Last Year: Not on file  Transportation Needs:   . Lack of Transportation (Medical): Not on file  . Lack of Transportation (Non-Medical): Not on file  Physical Activity:   . Days of Exercise per Week: Not on file  . Minutes of Exercise per Session: Not on file  Stress:   . Feeling of Stress : Not on file  Social Connections:   . Frequency of Communication with Friends and Family: Not on file  . Frequency of Social Gatherings with Friends and Family: Not on file  . Attends Religious Services: Not on file  . Active Member of Clubs or Organizations: Not on file  . Attends Archivist Meetings: Not on file  . Marital Status: Not on file    Tobacco Counseling Counseling given: Yes Comment: "quit smoking cigarettes in 1992"   Clinical Intake:  Pre-visit preparation completed: No  Pain : No/denies pain Pain Score: 0-No pain  Diabetes: Yes CBG done?: No Did pt. bring in CBG monitor from home?: No  How often do you need to have someone help you when you read instructions, pamphlets, or other written  materials from your doctor or pharmacy?: 3 - Sometimes What is the last grade level you completed in school?: 12  Diabetic? Yes   Interpreter Needed?: No    Activities of Daily Living In your present state of health, do you have any difficulty performing the following activities: 05/28/2020  Hearing? N  Vision? N  Difficulty concentrating or making decisions? N  Walking or climbing stairs? N  Dressing or bathing? N  Doing errands, shopping? N  Preparing Food and eating ? N  Using the Toilet? N  In the past six months, have you accidently leaked urine?  N  Do you have problems with loss of bowel control? N  Managing your Medications? N  Managing your Finances? N  Housekeeping or managing your Housekeeping? N  Some recent data might be hidden    Patient Care Team: Ladell Pier, MD as PCP - General (Internal Medicine)  Indicate any recent Medical Services you may have received from other than [facility] providers in the past year (date may be approximate).     Assessment:   This is a routine wellness examination for Timothy Lambert.  Hearing/Vision screen No exam data present  Dietary issues and exercise activities discussed: Current Exercise Habits: The patient does not participate in regular exercise at present, Exercise limited by: None identified  Goals   None    Depression Screen PHQ 2/9 Scores 05/28/2020 04/19/2020 03/13/2019 08/08/2018 05/06/2018 02/15/2018 01/11/2018  PHQ - 2 Score 0 0 0 0 0 0 0  PHQ- 9 Score - - - - 0 - -    Fall Risk Fall Risk  05/28/2020 04/19/2020 11/09/2019 03/13/2019 05/06/2018  Falls in the past year? 0 0 0 0 No  Number falls in past yr: 0 0 - - -  Injury with Fall? 0 0 - - -  Risk for fall due to : - - - - -  Follow up Falls evaluation completed;Falls prevention discussed;Education provided - - - -    Any stairs in or around the home? No  If so, are there any without handrails? No  Home free of loose throw rugs in walkways, pet beds, electrical  cords, etc? Yes  Adequate lighting in your home to reduce risk of falls? Yes   ASSISTIVE DEVICES UTILIZED TO PREVENT FALLS:  Life alert? No  Use of a cane, walker or w/c? No  Grab bars in the bathroom? No  Shower chair or bench in shower? No  Elevated toilet seat or a handicapped toilet? No   TIMED UP AND GO:  Was the test performed? Yes .  Length of time to ambulate 10 feet: <5 sec.   Gait steady and fast without use of assistive device  Cognitive Function: MMSE - Mini Mental State Exam 05/28/2020 08/27/2015  Orientation to time 5 5  Orientation to Place 4 5  Registration 3 3  Attention/ Calculation 5 5  Recall 3 3  Language- name 2 objects 2 2  Language- repeat 1 1  Language- follow 3 step command 3 3  Language- read & follow direction 1 1  Write a sentence 1 1  Copy design 1 0  Total score 29 29        Immunizations Immunization History  Administered Date(s) Administered  . Moderna SARS-COVID-2 Vaccination 04/22/2020, 05/20/2020  . Pneumococcal Polysaccharide-23 01/24/2015    TDAP status: Up to date Flu Vaccine status: Declined, Education has been provided regarding the importance of this vaccine but patient still declined. Advised may receive this vaccine at local pharmacy or Health Dept. Aware to provide a copy of the vaccination record if obtained from local pharmacy or Health Dept. Verbalized acceptance and understanding. Pneumococcal vaccine status: Up to date Covid-19 vaccine status: Completed vaccines  Qualifies for Shingles Vaccine? Yes   Zostavax completed No   Shingrix Completed?: No.    Education has been provided regarding the importance of this vaccine. Patient has been advised to call insurance company to determine out of pocket expense if they have not yet received this vaccine. Advised may also receive vaccine at local pharmacy or Health Dept. Verbalized acceptance  and understanding.  Screening Tests Health Maintenance  Topic Date Due  .  Hepatitis C Screening  Never done  . HIV Screening  Never done  . FOOT EXAM  03/12/2020  . INFLUENZA VACCINE  07/18/2020 (Originally 04/21/2020)  . COLONOSCOPY  02/07/2021 (Originally 11/27/2006)  . HEMOGLOBIN A1C  08/10/2020  . OPHTHALMOLOGY EXAM  02/07/2021  . TETANUS/TDAP  05/22/2025  . PNEUMOCOCCAL POLYSACCHARIDE VACCINE AGE 29-64 HIGH RISK  Completed  . COVID-19 Vaccine  Completed    Health Maintenance  Health Maintenance Due  Topic Date Due  . Hepatitis C Screening  Never done  . HIV Screening  Never done  . FOOT EXAM  03/12/2020    CRC Screening: Colonoscopy declined.   Lung Cancer Screening: (Low Dose CT Chest recommended if Age 51-80 years, 30 pack-year currently smoking OR have quit w/in 15years.) does not qualify.   Lung Cancer Screening Referral: none   Additional Screening:  Hepatitis C Screening: does qualify; will make provider aware   Vision Screening: Recommended annual ophthalmology exams for early detection of glaucoma and other disorders of the eye. Is the patient up to date with their annual eye exam?  Yes  Who is the provider or what is the name of the office in which the patient attends annual eye exams?  Dr. Celesta Aver  Dental Screening: Recommended annual dental exams for proper oral hygiene  Community Resource Referral / Chronic Care Management: CRR required this visit?  No   CCM required this visit?  No      Plan:     I have personally reviewed and noted the following in the patient's chart:   . Medical and social history . Use of alcohol, tobacco or illicit drugs  . Current medications and supplements . Functional ability and status . Nutritional status . Physical activity . Advanced directives . List of other physicians . Hospitalizations, surgeries, and ER visits in previous 12 months . Vitals . Screenings to include cognitive, depression, and falls . Referrals and appointments  In addition, I have reviewed and discussed with  patient certain preventive protocols, quality metrics, and best practice recommendations. A written personalized care plan for preventive services as well as general preventive health recommendations were provided to patient.     [place], RPH-CPP   05/28/2020

## 2020-05-29 ENCOUNTER — Other Ambulatory Visit: Payer: Self-pay | Admitting: Internal Medicine

## 2020-05-29 DIAGNOSIS — Z114 Encounter for screening for human immunodeficiency virus [HIV]: Secondary | ICD-10-CM

## 2020-05-29 DIAGNOSIS — Z1159 Encounter for screening for other viral diseases: Secondary | ICD-10-CM

## 2020-05-30 ENCOUNTER — Other Ambulatory Visit: Payer: Self-pay | Admitting: Internal Medicine

## 2020-05-30 MED FILL — FUROSEMIDE 20 MG TABS: 20 | 90 days supply | Qty: 90 | Fill #0

## 2020-05-30 MED FILL — ATORVASTATIN CALCIUM 40 MG: 40 | 90 days supply | Qty: 90 | Fill #0

## 2020-06-18 MED FILL — TRUEplus 5-BEVEL PEN NEEDLE: 32G X 4 MM | 90 days supply | Qty: 100 | Fill #1

## 2020-06-25 ENCOUNTER — Other Ambulatory Visit: Payer: Self-pay | Admitting: Internal Medicine

## 2020-06-25 MED FILL — ISOSORBIDE DN 20 MG TABLET: 20 | 60 days supply | Qty: 240 | Fill #0

## 2020-06-28 ENCOUNTER — Ambulatory Visit: Payer: Medicare Other | Admitting: Endocrinology

## 2020-06-28 ENCOUNTER — Encounter: Payer: Self-pay | Admitting: Endocrinology

## 2020-06-28 ENCOUNTER — Other Ambulatory Visit: Payer: Self-pay

## 2020-06-28 VITALS — BP 140/82 | HR 89 | Ht 66.0 in | Wt 181.5 lb

## 2020-06-28 DIAGNOSIS — N184 Chronic kidney disease, stage 4 (severe): Secondary | ICD-10-CM

## 2020-06-28 DIAGNOSIS — Z794 Long term (current) use of insulin: Secondary | ICD-10-CM

## 2020-06-28 DIAGNOSIS — E1122 Type 2 diabetes mellitus with diabetic chronic kidney disease: Secondary | ICD-10-CM | POA: Diagnosis not present

## 2020-06-28 DIAGNOSIS — E1121 Type 2 diabetes mellitus with diabetic nephropathy: Secondary | ICD-10-CM

## 2020-06-28 LAB — POCT GLYCOSYLATED HEMOGLOBIN (HGB A1C): Hemoglobin A1C: 6.9 % — AB (ref 4.0–5.6)

## 2020-06-28 MED ORDER — BASAGLAR KWIKPEN 100 UNIT/ML ~~LOC~~ SOPN: 30.0000 [IU] | PEN_INJECTOR | SUBCUTANEOUS | 6 refills | Status: DC

## 2020-06-28 NOTE — Progress Notes (Signed)
Subjective:    Patient ID: Timothy Lambert, male    DOB: [birth year], 64 y.o.   MRN: [id number]  HPI pt is referred by Dr Wynetta Emery, for diabetes.  Pt states DM was dx'ed in 3546; it is complicated by PDR, CAD, and stage 4 CRI; he has been on insulin since 2016; pt says his diet and exercise are good; he has never had pancreatitis, pancreatic surgery, severe hypoglycemia or DKA.  He take Basaglar, 33 units qhs. He says cbg varies from 45-170.   Past Medical History:  Diagnosis Date  . Acute renal failure (ARF) ([place]) 02/19/2015  . CHF (congestive heart failure) ([place])   . Coronary artery disease   . GERD (gastroesophageal reflux disease)   . HLD (hyperlipidemia)   . Hypertension   . Myocardial infarction ([place]) 01/22/2015   "massive"  . Refusal of blood transfusions as patient is Jehovah's Witness   . Seizures ([place]) 01/22/2015   "in front of paramedics"  . Stroke [facility]) 01/22/2015   "short term memory loss & right side weak since" (02/19/2015)  . Type II diabetes mellitus ([place])     Past Surgical History:  Procedure Laterality Date  . CARDIAC CATHETERIZATION N/A 01/23/2015   Procedure: Left Heart Cath and Coronary Angiography;  Surgeon: [name], MD;  Location: [facility] INVASIVE CV LAB CUPID;  Service: Cardiovascular;  Laterality: N/A;  . CARDIAC CATHETERIZATION N/A 01/23/2015   Procedure: Coronary Stent Intervention;  Surgeon: [name], MD;  Location: [facility] INVASIVE CV LAB CUPID;  Service: Cardiovascular;  Laterality: N/A;    Social History   Socioeconomic History  . Marital status: Married    Spouse name: Not on file  . Number of children: Not on file  . Years of education: Not on file  . Highest education level: Not on file  Occupational History  . Not on file  Tobacco Use  . Smoking status: Former Smoker    Packs/day: 0.33    Years: 14.00    Pack years: 4.62    Types: Cigarettes  . Smokeless tobacco: Never Used  . Tobacco comment: "quit smoking cigarettes in 1992"  Substance  and Sexual Activity  . Alcohol use: No    Comment: 02/19/2015 "stopped drinking 01/22/2015"  . Drug use: No  . Sexual activity: Not Currently  Other Topics Concern  . Not on file  Social History Narrative   Lives   Caffeine use: Coffee sometimes   Social Determinants of Health   Financial Resource Strain:   . Difficulty of Paying Living Expenses: Not on file  Food Insecurity:   . Worried About Charity fundraiser in the Last Year: Not on file  . Ran Out of Food in the Last Year: Not on file  Transportation Needs:   . Lack of Transportation (Medical): Not on file  . Lack of Transportation (Non-Medical): Not on file  Physical Activity:   . Days of Exercise per Week: Not on file  . Minutes of Exercise per Session: Not on file  Stress:   . Feeling of Stress : Not on file  Social Connections:   . Frequency of Communication with Friends and Family: Not on file  . Frequency of Social Gatherings with Friends and Family: Not on file  . Attends Religious Services: Not on file  . Active Member of Clubs or Organizations: Not on file  . Attends Archivist Meetings: Not on file  . Marital Status: Not on file  Intimate Partner Violence:   .  Fear of Current or Ex-Partner: Not on file  . Emotionally Abused: Not on file  . Physically Abused: Not on file  . Sexually Abused: Not on file    Current Outpatient Medications on File Prior to Visit  Medication Sig Dispense Refill  . amLODipine (NORVASC) 10 MG tablet Take 1 tablet (10 mg total) by mouth daily. 30 tablet 6  . aspirin EC 81 MG tablet Take 1 tablet (81 mg total) by mouth daily. 100 tablet 1  . atorvastatin (LIPITOR) 40 MG tablet TAKE 1 TABLET (40 MG TOTAL) BY MOUTH DAILY AT 6 PM. 90 tablet 1  . Blood Glucose Monitoring Suppl (TRUE METRIX METER) w/Device KIT Use as directed 1 kit 0  . carvedilol (COREG) 25 MG tablet Take 1.5 tablets (37.5 mg total) by mouth 2 (two) times daily with a meal. 90 tablet 5  . furosemide (LASIX) 20  MG tablet Take 1 tablet (20 mg total) by mouth daily. 30 tablet 2  . glucose blood (TRUE METRIX BLOOD GLUCOSE TEST) test strip Use as instructed 100 each 12  . hydrALAZINE (APRESOLINE) 50 MG tablet Take 1.5 tablets (75 mg total) by mouth 2 (two) times daily. 90 tablet 6  . Insulin Syringe-Needle U-100 (INSULIN SYRINGE .5CC/30GX1/2") 30G X 1/2" 0.5 ML MISC Check blood sugar TID & QHS 100 each 2  . isosorbide dinitrate (ISORDIL) 20 MG tablet TAKE 2 TABLETS (40 MG TOTAL) BY MOUTH 2 (TWO) TIMES DAILY. 240 tablet 0  . Lancets (ACCU-CHEK SOFT TOUCH) lancets Use as instructed 100 each 12  . lisinopril (ZESTRIL) 40 MG tablet Take 1 tablet (40 mg total) by mouth daily. 30 tablet 3  . TRUEPLUS LANCETS 28G MISC 1 each by Does not apply route 3 (three) times daily before meals. 100 each 12  . TRUEPLUS PEN NEEDLES 32G X 4 MM MISC USE TO INJECT INSULIN AS DIRECTED 100 each 5   No current facility-administered medications on file prior to visit.    No Known Allergies  Family History  Problem Relation Age of Onset  . Diabetes Father   . Seizures Neg Hx     BP 140/82   Pulse 89   Ht 5' 6"  (1.676 m)   Wt 181 lb 8 oz (82.3 kg)   SpO2 98%   BMI 29.29 kg/m   Review of Systems denies weight loss, chest pain, sob, n/v, urinary frequency, memory loss, and depression.      Objective:   Physical Exam VITAL SIGNS:  See vs page GENERAL: no distress Pulses: dorsalis pedis intact bilat.   MSK: no deformity of the feet CV: trace bilat leg edema Skin:  no ulcer on the feet.  normal color and temp on the feet. Neuro: sensation is intact to touch on the feet  Lab Results  Component Value Date   HGBA1C 6.9 (A) 06/28/2020   Lab Results  Component Value Date   CREATININE 2.39 (H) 05/17/2020   BUN 42 (H) 05/17/2020   NA 140 05/17/2020   K 4.3 05/17/2020   CL 107 05/17/2020   CO2 23 05/17/2020   I have reviewed outside records, and summarized: Pt was noted to have elevated A1c, and referred here.   CRI and HTN were also addressed.        Assessment & Plan:  Insulin-requiring type 2 DM, with stage 4 CRI: He declines multiple daily injections.   Hypoglycemia, due to insulin: this limits aggressiveness of glycemic control.    Patient Instructions  good diet and  exercise significantly improve the control of your diabetes.  please let me know if you wish to be referred to a dietician.  high blood sugar is very risky to your health.  you should see an eye doctor and dentist every year.  It is very important to get all recommended vaccinations.  Controlling your blood pressure and cholesterol drastically reduces the damage diabetes does to your body.  Those who smoke should quit.  Please discuss these with your doctor.  check your blood sugar twice a day.  vary the time of day when you check, between before the 3 meals, and at bedtime.  also check if you have symptoms of your blood sugar being too high or too low.  please keep a record of the readings and bring it to your next appointment here (or you can bring the meter itself).  You can write it on any piece of paper.  please call us sooner if your blood sugar goes below 70, or if you have a lot of readings over 200.  Please change the Basaglar to 30 units each morning.  Please come back for a follow-up appointment in 6 weeks, when we'll change to a cheaper insulin if we can.

## 2020-06-28 NOTE — Patient Instructions (Addendum)
good diet and exercise significantly improve the control of your diabetes.  please let me know if you wish to be referred to a dietician.  high blood sugar is very risky to your health.  you should see an eye doctor and dentist every year.  It is very important to get all recommended vaccinations.  Controlling your blood pressure and cholesterol drastically reduces the damage diabetes does to your body.  Those who smoke should quit.  Please discuss these with your doctor.  check your blood sugar twice a day.  vary the time of day when you check, between before the 3 meals, and at bedtime.  also check if you have symptoms of your blood sugar being too high or too low.  please keep a record of the readings and bring it to your next appointment here (or you can bring the meter itself).  You can write it on any piece of paper.  please call us sooner if your blood sugar goes below 70, or if you have a lot of readings over 200.  Please change the Basaglar to 30 units each morning.  Please come back for a follow-up appointment in 6 weeks, when we'll change to a cheaper insulin if we can.

## 2020-06-29 DIAGNOSIS — E785 Hyperlipidemia, unspecified: Secondary | ICD-10-CM | POA: Insufficient documentation

## 2020-06-29 DIAGNOSIS — E119 Type 2 diabetes mellitus without complications: Secondary | ICD-10-CM | POA: Insufficient documentation

## 2020-07-12 MED FILL — LANTUS SOLOSTAR 100 UNITS/M: 100 | 84 days supply | Qty: 27 | Fill #3

## 2020-07-18 ENCOUNTER — Ambulatory Visit: Payer: Medicare Other | Admitting: Internal Medicine

## 2020-07-24 MED FILL — AMLODIPINE BESYLATE 10 MG T: 10 | 90 days supply | Qty: 90 | Fill #1

## 2020-07-24 MED FILL — CARVEDILOL 25 MG TABLET: 25 | 30 days supply | Qty: 90 | Fill #1

## 2020-08-05 ENCOUNTER — Other Ambulatory Visit: Payer: Self-pay

## 2020-08-05 ENCOUNTER — Encounter: Payer: Self-pay | Admitting: Endocrinology

## 2020-08-05 ENCOUNTER — Ambulatory Visit: Payer: Medicare Other | Admitting: Endocrinology

## 2020-08-05 ENCOUNTER — Other Ambulatory Visit: Payer: Self-pay | Admitting: *Deleted

## 2020-08-05 ENCOUNTER — Telehealth: Payer: Self-pay | Admitting: Endocrinology

## 2020-08-05 VITALS — BP 172/86 | HR 80 | Ht 66.0 in | Wt 182.0 lb

## 2020-08-05 DIAGNOSIS — E1165 Type 2 diabetes mellitus with hyperglycemia: Secondary | ICD-10-CM | POA: Diagnosis not present

## 2020-08-05 DIAGNOSIS — Z794 Long term (current) use of insulin: Secondary | ICD-10-CM | POA: Diagnosis not present

## 2020-08-05 DIAGNOSIS — E118 Type 2 diabetes mellitus with unspecified complications: Secondary | ICD-10-CM

## 2020-08-05 DIAGNOSIS — E1122 Type 2 diabetes mellitus with diabetic chronic kidney disease: Secondary | ICD-10-CM

## 2020-08-05 DIAGNOSIS — IMO0002 Reserved for concepts with insufficient information to code with codable children: Secondary | ICD-10-CM

## 2020-08-05 DIAGNOSIS — N184 Chronic kidney disease, stage 4 (severe): Secondary | ICD-10-CM

## 2020-08-05 MED ORDER — ACCU-CHEK SOFT TOUCH LANCETS MISC
3 refills | Status: DC
Start: 2020-08-05 — End: 2022-01-30

## 2020-08-05 MED ORDER — TRUE METRIX METER W/DEVICE KIT: PACK | 0 refills | Status: DC

## 2020-08-05 MED ORDER — ACCU-CHEK GUIDE VI STRP: ORAL_STRIP | 3 refills | Status: DC

## 2020-08-05 MED FILL — ACCU-CHEK GUIDE W/DEVICE KI: W/DEVICE | 1 days supply | Qty: 1 | Fill #0

## 2020-08-05 NOTE — Patient Instructions (Addendum)
check your blood sugar twice a day.  vary the time of day when you check, between before the 3 meals, and at bedtime.  also check if you have symptoms of your blood sugar being too high or too low.  please keep a record of the readings and bring it to your next appointment here (or you can bring the meter itself).  You can write it on any piece of paper.  please call us sooner if your blood sugar goes below 70, or if you have a lot of readings over 200.  A different type of diabetes lood test is requested for you today.  We'll let you know about the result.   Please come back for a follow-up appointment in 2 months.

## 2020-08-05 NOTE — Progress Notes (Signed)
Subjective:    Patient ID: Timothy Lambert, male    DOB: [birth year], 64 y.o.   MRN: [id number]  HPI Pt returns for f/u of diabetes mellitus: DM type: Insulin-requiring type 2 Dx'ed: 7096 Complications: PDR, CAD, and stage 4 CRI Therapy: insulin since 2016.  DKA: never Severe hypoglycemia: never Pancreatitis: never Pancreatic imaging: never SDOH: none Other: he declines multiple daily injections Interval history: He does not check cbg's.  He requests a new meter.  pt states he feels well in general.  He says he can afford the insulin for now.  Pt says he never misses the insulin.    Past Medical History:  Diagnosis Date  . Acute renal failure (ARF) ([place]) 02/19/2015  . CHF (congestive heart failure) ([place])   . Coronary artery disease   . GERD (gastroesophageal reflux disease)   . HLD (hyperlipidemia)   . Hypertension   . Myocardial infarction ([place]) 01/22/2015   "massive"  . Refusal of blood transfusions as patient is Jehovah's Witness   . Seizures ([place]) 01/22/2015   "in front of paramedics"  . Stroke [facility]) 01/22/2015   "short term memory loss & right side weak since" (02/19/2015)  . Type II diabetes mellitus ([place])     Past Surgical History:  Procedure Laterality Date  . CARDIAC CATHETERIZATION N/A 01/23/2015   Procedure: Left Heart Cath and Coronary Angiography;  Surgeon: [name], MD;  Location: [facility] INVASIVE CV LAB CUPID;  Service: Cardiovascular;  Laterality: N/A;  . CARDIAC CATHETERIZATION N/A 01/23/2015   Procedure: Coronary Stent Intervention;  Surgeon: [name], MD;  Location: [facility] INVASIVE CV LAB CUPID;  Service: Cardiovascular;  Laterality: N/A;    Social History   Socioeconomic History  . Marital status: Married    Spouse name: Not on file  . Number of children: Not on file  . Years of education: Not on file  . Highest education level: Not on file  Occupational History  . Not on file  Tobacco Use  . Smoking status: Former Smoker    Packs/day: 0.33     Years: 14.00    Pack years: 4.62    Types: Cigarettes  . Smokeless tobacco: Never Used  . Tobacco comment: "quit smoking cigarettes in 1992"  Substance and Sexual Activity  . Alcohol use: No    Comment: 02/19/2015 "stopped drinking 01/22/2015"  . Drug use: No  . Sexual activity: Not Currently  Other Topics Concern  . Not on file  Social History Narrative   Lives   Caffeine use: Coffee sometimes   Social Determinants of Health   Financial Resource Strain:   . Difficulty of Paying Living Expenses: Not on file  Food Insecurity:   . Worried About Charity fundraiser in the Last Year: Not on file  . Ran Out of Food in the Last Year: Not on file  Transportation Needs:   . Lack of Transportation (Medical): Not on file  . Lack of Transportation (Non-Medical): Not on file  Physical Activity:   . Days of Exercise per Week: Not on file  . Minutes of Exercise per Session: Not on file  Stress:   . Feeling of Stress : Not on file  Social Connections:   . Frequency of Communication with Friends and Family: Not on file  . Frequency of Social Gatherings with Friends and Family: Not on file  . Attends Religious Services: Not on file  . Active Member of Clubs or Organizations: Not on file  . Attends Club or  Organization Meetings: Not on file  . Marital Status: Not on file  Intimate Partner Violence:   . Fear of Current or Ex-Partner: Not on file  . Emotionally Abused: Not on file  . Physically Abused: Not on file  . Sexually Abused: Not on file    Current Outpatient Medications on File Prior to Visit  Medication Sig Dispense Refill  . amLODipine (NORVASC) 10 MG tablet Take 1 tablet (10 mg total) by mouth daily. 30 tablet 6  . aspirin EC 81 MG tablet Take 1 tablet (81 mg total) by mouth daily. 100 tablet 1  . atorvastatin (LIPITOR) 40 MG tablet TAKE 1 TABLET (40 MG TOTAL) BY MOUTH DAILY AT 6 PM. 90 tablet 1  . carvedilol (COREG) 25 MG tablet Take 1.5 tablets (37.5 mg total) by mouth 2  (two) times daily with a meal. 90 tablet 5  . furosemide (LASIX) 20 MG tablet Take 1 tablet (20 mg total) by mouth daily. 30 tablet 2  . glucose blood (TRUE METRIX BLOOD GLUCOSE TEST) test strip Use as instructed (Patient not taking: Reported on 08/05/2020) 100 each 12  . hydrALAZINE (APRESOLINE) 50 MG tablet Take 1.5 tablets (75 mg total) by mouth 2 (two) times daily. 90 tablet 6  . Insulin Glargine (BASAGLAR KWIKPEN) 100 UNIT/ML Inject 30 Units into the skin every morning. 15 mL 6  . Insulin Syringe-Needle U-100 (INSULIN SYRINGE .5CC/30GX1/2") 30G X 1/2" 0.5 ML MISC Check blood sugar TID & QHS 100 each 2  . isosorbide dinitrate (ISORDIL) 20 MG tablet TAKE 2 TABLETS (40 MG TOTAL) BY MOUTH 2 (TWO) TIMES DAILY. 240 tablet 0  . Lancets (ACCU-CHEK SOFT TOUCH) lancets Use as instructed 100 each 12  . lisinopril (ZESTRIL) 40 MG tablet Take 1 tablet (40 mg total) by mouth daily. 30 tablet 3  . TRUEPLUS LANCETS 28G MISC 1 each by Does not apply route 3 (three) times daily before meals. (Patient not taking: Reported on 08/05/2020) 100 each 12  . TRUEPLUS PEN NEEDLES 32G X 4 MM MISC USE TO INJECT INSULIN AS DIRECTED 100 each 5   No current facility-administered medications on file prior to visit.    No Known Allergies  Family History  Problem Relation Age of Onset  . Diabetes Father   . Seizures Neg Hx     BP (!) 172/86   Pulse 80   Ht 5\' 6"  (1.676 m)   Wt 182 lb (82.6 kg)   SpO2 99%   BMI 29.38 kg/m    Review of Systems He denies hypoglycemia.     Objective:   Physical Exam VITAL SIGNS:  See vs page GENERAL: no distress Pulses: dorsalis pedis intact bilat.   MSK: no deformity of the feet CV: 1+ bilat leg edema Skin:  no ulcer on the feet.  normal color and temp on the feet. Neuro: sensation is intact to touch on the feet  Lab Results  Component Value Date   HGBA1C 6.9 (A) 06/28/2020        Assessment & Plan:  Insulin-requiring type 2 DM, with CAD, overcontrolled, given  this regimen, which does match insulin to his changing needs throughout the day Stage 4 CRI: check fructosamine.   Patient Instructions  check your blood sugar twice a day.  vary the time of day when you check, between before the 3 meals, and at bedtime.  also check if you have symptoms of your blood sugar being too high or too low.  please keep a record of  the readings and bring it to your next appointment here (or you can bring the meter itself).  You can write it on any piece of paper.  please call us sooner if your blood sugar goes below 70, or if you have a lot of readings over 200.  A different type of diabetes lood test is requested for you today.  We'll let you know about the result.   Please come back for a follow-up appointment in 2 months.

## 2020-08-05 NOTE — Telephone Encounter (Signed)
Pharmacy called to advise that they had to change patients meter to Accu check Guide meter since that is the meter covered by patient's insurance.    Patient needs Accu Chek Guide test strips and the Accu Chek Guide soft click lancets sent to pharmacy for fill

## 2020-08-05 NOTE — Telephone Encounter (Signed)
Rx sent 

## 2020-08-06 MED FILL — ACCU-CHEK GUIDE TEST STRIP: 30 days supply | Qty: 100 | Fill #0

## 2020-08-06 MED FILL — ACCU-CHEK SOFTCLIX LANCETS: 30 days supply | Qty: 100 | Fill #0

## 2020-08-07 LAB — FRUCTOSAMINE: Fructosamine: 345 umol/L — ABNORMAL HIGH (ref 205–285)

## 2020-08-29 ENCOUNTER — Other Ambulatory Visit: Payer: Self-pay

## 2020-08-29 ENCOUNTER — Ambulatory Visit: Payer: Medicare Other | Attending: Internal Medicine | Admitting: Internal Medicine

## 2020-08-29 DIAGNOSIS — M545 Low back pain, unspecified: Secondary | ICD-10-CM | POA: Diagnosis not present

## 2020-08-29 NOTE — Progress Notes (Signed)
Pt states he is not in any pain he is more sore

## 2020-08-29 NOTE — Progress Notes (Signed)
Virtual Visit via Telephone Note  I connected with Timothy Lambert on 08/29/20 at  2:10 PM EST by telephone and verified that I am speaking with the correct person using two identifiers.  Location: Patient: home Provider: office   I discussed the limitations, risks, security and privacy concerns of performing an evaluation and management service by telephone and the availability of in person appointments. I also discussed with the patient that there may be a patient responsible charge related to this service. The patient expressed understanding and agreed to proceed.   History of Present Illness: Pt with hx of DMwith retinopathy and nephropathy, HTN, HL, CAD with DES LAD 2016,embolicCVA, PAF (thought to be a poor candidate for anticoagulation because of history of EtOH abuse at the time of his CVA in 2016) ETOH withdrawal sz, CKD stage 3, macroalbuminuria.  Last visit was in July of this year.  Purpose of today's visit is urgent care for lower back pain.  Patient complains of pain across the lower back x1 week.  No known initiating factors.  Pain is worse in the mornings and gets better once he gets moving during the day.  He rates the pain as 6 out of 10.  Denies any dysuria or hematuria.  Pain does not radiate down the legs.  No incontinence of bowel or bladder function.  No weakness in the legs.  He would like to have a few days off.  He works as a Designer, jewellery for several hours.-  Outpatient Encounter Medications as of 08/29/2020  Medication Sig  . amLODipine (NORVASC) 10 MG tablet Take 1 tablet (10 mg total) by mouth daily.  Marland Kitchen aspirin EC 81 MG tablet Take 1 tablet (81 mg total) by mouth daily.  Marland Kitchen atorvastatin (LIPITOR) 40 MG tablet TAKE 1 TABLET (40 MG TOTAL) BY MOUTH DAILY AT 6 PM.  . Blood Glucose Monitoring Suppl (TRUE METRIX METER) w/Device KIT Use as directed (Patient not taking: Reported on 08/05/2020)  . carvedilol (COREG) 25 MG tablet Take 1.5 tablets (37.5 mg total) by  mouth 2 (two) times daily with a meal.  . furosemide (LASIX) 20 MG tablet Take 1 tablet (20 mg total) by mouth daily.  Marland Kitchen glucose blood (ACCU-CHEK GUIDE) test strip Use as instructed 3 times daily DX code E11.9  . glucose blood (TRUE METRIX BLOOD GLUCOSE TEST) test strip Use as instructed (Patient not taking: Reported on 08/05/2020)  . hydrALAZINE (APRESOLINE) 50 MG tablet Take 1.5 tablets (75 mg total) by mouth 2 (two) times daily.  . Insulin Glargine (BASAGLAR KWIKPEN) 100 UNIT/ML Inject 30 Units into the skin every morning.  . Insulin Syringe-Needle U-100 (INSULIN SYRINGE .5CC/30GX1/2") 30G X 1/2" 0.5 ML MISC Check blood sugar TID & QHS  . isosorbide dinitrate (ISORDIL) 20 MG tablet TAKE 2 TABLETS (40 MG TOTAL) BY MOUTH 2 (TWO) TIMES DAILY.  Marland Kitchen Lancets (ACCU-CHEK SOFT TOUCH) lancets Use as instructed  . Lancets (ACCU-CHEK SOFT TOUCH) lancets Use as instructed to check blood sugar 3 times daily Dx code E11.9  . lisinopril (ZESTRIL) 40 MG tablet Take 1 tablet (40 mg total) by mouth daily.  . TRUEPLUS LANCETS 28G MISC 1 each by Does not apply route 3 (three) times daily before meals. (Patient not taking: Reported on 08/05/2020)  . TRUEPLUS PEN NEEDLES 32G X 4 MM MISC USE TO INJECT INSULIN AS DIRECTED   No facility-administered encounter medications on file as of 08/29/2020.      Observations/Objective: No direct observation done as this was a  telephone encounter.  Assessment and Plan: 1. Acute bilateral low back pain without sciatica Of questionable etiology but could be lumbar strain.  I recommend taking some Tylenol and using heating pad to the area.  He has a follow-up appointment with me later this month for chronic disease management.  We will see how he is doing at that time.  He is agreeable to coming to the lab to have lab test done including a PSA check and urinalysis. - PSA; Future - Urinalysis, Routine w reflex microscopic; Future   Follow Up Instructions: As needed.  Keep his  follow-up appointment with me later this month   I discussed the assessment and treatment plan with the patient. The patient was provided an opportunity to ask questions and all were answered. The patient agreed with the plan and demonstrated an understanding of the instructions.   The patient was advised to call back or seek an in-person evaluation if the symptoms worsen or if the condition fails to improve as anticipated.  I provided 11 minutes of non-face-to-face time during this encounter.   Karle Plumber, MD

## 2020-08-30 ENCOUNTER — Other Ambulatory Visit: Payer: Self-pay | Admitting: Internal Medicine

## 2020-08-30 ENCOUNTER — Ambulatory Visit: Payer: Medicare Other | Attending: Internal Medicine

## 2020-08-30 ENCOUNTER — Other Ambulatory Visit: Payer: Self-pay

## 2020-08-30 DIAGNOSIS — Z1159 Encounter for screening for other viral diseases: Secondary | ICD-10-CM

## 2020-08-30 DIAGNOSIS — M545 Low back pain, unspecified: Secondary | ICD-10-CM | POA: Diagnosis not present

## 2020-08-30 DIAGNOSIS — Z794 Long term (current) use of insulin: Secondary | ICD-10-CM | POA: Diagnosis not present

## 2020-08-30 DIAGNOSIS — E1121 Type 2 diabetes mellitus with diabetic nephropathy: Secondary | ICD-10-CM

## 2020-08-30 DIAGNOSIS — I10 Essential (primary) hypertension: Secondary | ICD-10-CM

## 2020-08-30 DIAGNOSIS — Z114 Encounter for screening for human immunodeficiency virus [HIV]: Secondary | ICD-10-CM

## 2020-08-30 DIAGNOSIS — I5022 Chronic systolic (congestive) heart failure: Secondary | ICD-10-CM

## 2020-08-30 MED FILL — ATORVASTATIN CALCIUM 40 MG: 40 | 90 days supply | Qty: 90 | Fill #1

## 2020-08-30 MED FILL — FUROSEMIDE 20 MG TABS: 20 | 90 days supply | Qty: 90 | Fill #0

## 2020-08-31 LAB — MICROSCOPIC EXAMINATION
Bacteria, UA: NONE SEEN
Casts: NONE SEEN /lpf

## 2020-08-31 LAB — URINALYSIS, ROUTINE W REFLEX MICROSCOPIC
Bilirubin, UA: NEGATIVE
Glucose, UA: NEGATIVE
Ketones, UA: NEGATIVE
Leukocytes,UA: NEGATIVE
Nitrite, UA: NEGATIVE
RBC, UA: NEGATIVE
Specific Gravity, UA: 1.018 (ref 1.005–1.030)
Urobilinogen, Ur: 0.2 mg/dL (ref 0.2–1.0)
pH, UA: 5.5 (ref 5.0–7.5)

## 2020-08-31 LAB — MICROALBUMIN / CREATININE URINE RATIO
Creatinine, Urine: 185.8 mg/dL
Microalb/Creat Ratio: 463 mg/g creat — ABNORMAL HIGH (ref 0–29)
Microalbumin, Urine: 860.4 ug/mL

## 2020-08-31 LAB — PSA: Prostate Specific Ag, Serum: 1.5 ng/mL (ref 0.0–4.0)

## 2020-08-31 LAB — HIV ANTIBODY (ROUTINE TESTING W REFLEX): HIV Screen 4th Generation wRfx: NONREACTIVE

## 2020-08-31 LAB — HEPATITIS C ANTIBODY: Hep C Virus Ab: <0.1 s/co ratio (ref 0.0–0.9)

## 2020-09-01 NOTE — Progress Notes (Signed)
Also let pt know that he has increase protein in the urine but much less compared to 2 yrs ago.  Continuing taking his BP and DM meds.

## 2020-09-02 ENCOUNTER — Telehealth: Payer: Self-pay

## 2020-09-02 NOTE — Telephone Encounter (Signed)
Contacted pt to go over lab results pt didn't answer lvm  

## 2020-09-03 DIAGNOSIS — I129 Hypertensive chronic kidney disease with stage 1 through stage 4 chronic kidney disease, or unspecified chronic kidney disease: Secondary | ICD-10-CM | POA: Diagnosis not present

## 2020-09-03 DIAGNOSIS — E1122 Type 2 diabetes mellitus with diabetic chronic kidney disease: Secondary | ICD-10-CM | POA: Diagnosis not present

## 2020-09-03 DIAGNOSIS — D631 Anemia in chronic kidney disease: Secondary | ICD-10-CM | POA: Diagnosis not present

## 2020-09-03 DIAGNOSIS — R809 Proteinuria, unspecified: Secondary | ICD-10-CM | POA: Diagnosis not present

## 2020-09-03 DIAGNOSIS — N183 Chronic kidney disease, stage 3 unspecified: Secondary | ICD-10-CM | POA: Diagnosis not present

## 2020-09-09 ENCOUNTER — Other Ambulatory Visit: Payer: Self-pay

## 2020-09-09 ENCOUNTER — Encounter: Payer: Self-pay | Admitting: Internal Medicine

## 2020-09-09 ENCOUNTER — Other Ambulatory Visit: Payer: Self-pay | Admitting: Internal Medicine

## 2020-09-09 ENCOUNTER — Ambulatory Visit: Payer: Medicare Other | Attending: Internal Medicine | Admitting: Internal Medicine

## 2020-09-09 VITALS — BP 160/90 | HR 78 | Temp 98.1°F | Resp 16 | Wt 179.6 lb

## 2020-09-09 DIAGNOSIS — Z2821 Immunization not carried out because of patient refusal: Secondary | ICD-10-CM | POA: Diagnosis not present

## 2020-09-09 DIAGNOSIS — I2510 Atherosclerotic heart disease of native coronary artery without angina pectoris: Secondary | ICD-10-CM

## 2020-09-09 DIAGNOSIS — E1121 Type 2 diabetes mellitus with diabetic nephropathy: Secondary | ICD-10-CM | POA: Diagnosis not present

## 2020-09-09 DIAGNOSIS — E663 Overweight: Secondary | ICD-10-CM

## 2020-09-09 DIAGNOSIS — I10 Essential (primary) hypertension: Secondary | ICD-10-CM | POA: Diagnosis not present

## 2020-09-09 LAB — POCT GLYCOSYLATED HEMOGLOBIN (HGB A1C): HbA1c, POC (controlled diabetic range): 6.7 % (ref 0.0–7.0)

## 2020-09-09 LAB — GLUCOSE, POCT (MANUAL RESULT ENTRY): POC Glucose: 93 mg/dl (ref 70–99)

## 2020-09-09 MED ORDER — LISINOPRIL 40 MG PO TABS: 40.0000 mg | ORAL_TABLET | Freq: Every day | ORAL | 3 refills | Status: DC

## 2020-09-09 MED ORDER — AMLODIPINE BESYLATE 10 MG PO TABS: 10.0000 mg | ORAL_TABLET | Freq: Every day | ORAL | 3 refills | Status: DC

## 2020-09-09 MED ORDER — ISOSORBIDE DINITRATE 20 MG PO TABS: 40.0000 mg | ORAL_TABLET | Freq: Two times a day (BID) | ORAL | 0 refills | Status: DC

## 2020-09-09 MED FILL — LISINOPRIL 40 MG TABS: 40 | 90 days supply | Qty: 90 | Fill #0

## 2020-09-09 MED FILL — ISOSORBIDE DINITRATE 20 MG: 20 | 60 days supply | Qty: 240 | Fill #0

## 2020-09-09 NOTE — Progress Notes (Signed)
Patient ID: Timothy Lambert, male    DOB: 1956-02-17  MRN: [id number]  CC: Diabetes and Hypertension   Subjective: Timothy Lambert is a 64 y.o. male who presents for chronic ds managment His concerns today include:  Pt with hx of DMwith retinopathy and nephropathy, HTN, HL, CAD with DES LAD 2016,embolicCVA, PAF (thought to be a poor candidate for anticoagulation because of history of EtOH abuse at the time of his CVA in 2016) ETOH withdrawal sz, CKD stage 3, macroalbuminuria.   HTN/CAD:  Out of Lisinopril x 1 day but took other meds already today.  Reports compliance with medications No CP/SOB/LE edema Not checking BP regularly but does have a home blood pressure device.  Recently seen by nephrology about a week ago and states that his blood pressure was much better.  CKD stage 3:  Seen 1 wk ago.  Reports having blood test done.  I will await results from the kidney specialist.  He is not on any NSAIDs..  DM: Followed by endocrinologist Dr. Loanne Drilling.  Trulicity d/c because it bothers his stomach. On Glargine 28 units daily Checking BS 2-3 times.  Range 80-90s after meals Doing good with eating habits.   Plans to start using his exercise bike now that his back is better. Results for orders placed or performed in visit on 09/09/20  POCT glucose (manual entry)  Result Value Ref Range   POC Glucose 93 70 - 99 mg/dl  POCT glycosylated hemoglobin (Hb A1C)  Result Value Ref Range   Hemoglobin A1C     HbA1c POC (<> result, manual entry)     HbA1c, POC (prediabetic range)     HbA1c, POC (controlled diabetic range) 6.7 0.0 - 7.0 %     Patient Active Problem List   Diagnosis Date Noted  . Influenza vaccine refused 09/09/2020  . Diabetes ([place]) 06/29/2020  . CKD (chronic kidney disease) stage 3, GFR 30-59 ml/min (HCC) 02/15/2018  . Macroalbuminuric diabetic nephropathy ([place]) 02/15/2018  . Diabetic retinopathy ([place]) 11/18/2015  . Paroxysmal atrial fibrillation ([place]) 10/28/2015  .  Cognitive deficit, post-stroke 05/13/2015  . Essential hypertension 02/19/2015  . CAD (coronary artery disease) 02/19/2015  . 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 CVA (cerebral vascular accident) ([place])   . Combined systolic and diastolic congestive heart failure ([place])   . Seizure [facility])      Current Outpatient Medications on File Prior to Visit  Medication Sig Dispense Refill  . aspirin EC 81 MG tablet Take 1 tablet (81 mg total) by mouth daily. 100 tablet 1  . atorvastatin (LIPITOR) 40 MG tablet TAKE 1 TABLET (40 MG TOTAL) BY MOUTH DAILY AT 6 PM. 90 tablet 1  . Blood Glucose Monitoring Suppl (TRUE METRIX METER) w/Device KIT Use as directed (Patient not taking: Reported on 08/05/2020) 1 kit 0  . carvedilol (COREG) 25 MG tablet Take 1.5 tablets (37.5 mg total) by mouth 2 (two) times daily with a meal. 90 tablet 5  . furosemide (LASIX) 20 MG tablet TAKE 1 TABLET (20 MG TOTAL) BY MOUTH DAILY. 90 tablet 1  . glucose blood (ACCU-CHEK GUIDE) test strip Use as instructed 3 times daily DX code E11.9 100 each 3  . glucose blood (TRUE METRIX BLOOD GLUCOSE TEST) test strip Use as instructed (Patient not taking: Reported on 08/05/2020) 100 each 12  . hydrALAZINE (APRESOLINE) 50 MG tablet Take 1.5 tablets (75 mg total) by mouth 2 (two) times daily. 90 tablet 6  . Insulin Glargine [facility])  100 UNIT/ML Inject 30 Units into the skin every morning. 15 mL 6  . Insulin Syringe-Needle U-100 (INSULIN SYRINGE .5CC/30GX1/2") 30G X 1/2" 0.5 ML MISC Check blood sugar TID & QHS 100 each 2  . Lancets (ACCU-CHEK SOFT TOUCH) lancets Use as instructed 100 each 12  . Lancets (ACCU-CHEK SOFT TOUCH) lancets Use as instructed to check blood sugar 3 times daily Dx code E11.9 100 each 3  . TRUEPLUS LANCETS 28G MISC 1 each by Does not apply route 3 (three) times daily before meals. (Patient not taking: Reported on 08/05/2020) 100 each 12  . TRUEPLUS PEN NEEDLES 32G X 4 MM MISC USE TO INJECT INSULIN AS  DIRECTED 100 each 5   No current facility-administered medications on file prior to visit.    Allergies  Allergen Reactions  . Trulicity [Dulaglutide]     Upset his stomach    Social History   Socioeconomic History  . Marital status: Married    Spouse name: Not on file  . Number of children: Not on file  . Years of education: Not on file  . Highest education level: Not on file  Occupational History  . Not on file  Tobacco Use  . Smoking status: Former Smoker    Packs/day: 0.33    Years: 14.00    Pack years: 4.62    Types: Cigarettes  . Smokeless tobacco: Never Used  . Tobacco comment: "quit smoking cigarettes in 1992"  Substance and Sexual Activity  . Alcohol use: No    Comment: 02/19/2015 "stopped drinking 01/22/2015"  . Drug use: No  . Sexual activity: Not Currently  Other Topics Concern  . Not on file  Social History Narrative   Lives   Caffeine use: Coffee sometimes   Social Determinants of Health   Financial Resource Strain: Not on file  Food Insecurity: Not on file  Transportation Needs: Not on file  Physical Activity: Not on file  Stress: Not on file  Social Connections: Not on file  Intimate Partner Violence: Not on file    Family History  Problem Relation Age of Onset  . Diabetes Father   . Seizures Neg Hx     Past Surgical History:  Procedure Laterality Date  . CARDIAC CATHETERIZATION N/A 01/23/2015   Procedure: Left Heart Cath and Coronary Angiography;  Surgeon: [name], MD;  Location: [facility] INVASIVE CV LAB CUPID;  Service: Cardiovascular;  Laterality: N/A;  . CARDIAC CATHETERIZATION N/A 01/23/2015   Procedure: Coronary Stent Intervention;  Surgeon: [name], MD;  Location: [facility] INVASIVE CV LAB CUPID;  Service: Cardiovascular;  Laterality: N/A;    ROS: Review of Systems Negative except as stated above  PHYSICAL EXAM: BP (!) 160/90   Pulse 78   Temp 98.1 F (36.7 C)   Resp 16   Wt 179 lb 9.6 oz (81.5 kg)   SpO2 100%   BMI 28.99  kg/m   Wt Readings from Last 3 Encounters:  09/09/20 179 lb 9.6 oz (81.5 kg)  08/05/20 182 lb (82.6 kg)  06/28/20 181 lb 8 oz (82.3 kg)    Physical Exam  General appearance - alert, well appearing, and in no distress Mental status - normal mood, behavior, speech, dress, motor activity, and thought processes Neck - supple, no significant adenopathy Chest - clear to auscultation, no wheezes, rales or rhonchi, symmetric air entry Heart - normal rate, regular rhythm, normal S1, S2, no murmurs, rubs, clicks or gallops Extremities -no lower extremity edema CMP Latest Ref Rng & Units  05/17/2020 11/17/2019 03/13/2019  Glucose 70 - 99 mg/dL 93 197(H) 242(H)  BUN 8 - 23 mg/dL 42(H) 38(H) 33(H)  Creatinine 0.61 - 1.24 mg/dL 2.39(H) 2.30(H) 2.05(H)  Sodium 135 - 145 mmol/L 140 140 139  Potassium 3.5 - 5.1 mmol/L 4.3 4.7 4.7  Chloride 98 - 111 mmol/L 107 105 104  CO2 22 - 32 mmol/L _0 Calcium 8.9 - 10.3 mg/dL 9.2 8.7 9.6  Total Protein 6.5 - 8.1 g/dL 7.6 - 6.6  Total Bilirubin 0.3 - 1.2 mg/dL 1.0 - 0.4  Alkaline Phos 38 - 126 U/L 52 - 86  AST 15 - 41 U/L 26 - 17  ALT 0 - 44 U/L 22 - 22   Lipid Panel     Component Value Date/Time   CHOL 174 03/13/2019 1639   TRIG 88 03/13/2019 1639   HDL 38 (L) 03/13/2019 1639   CHOLHDL 4.6 03/13/2019 1639   CHOLHDL 3.0 04/25/2015 1216   VLDL 17 04/25/2015 1216   LDLCALC 118 (H) 03/13/2019 1639    CBC    Component Value Date/Time   WBC 9.5 05/17/2020 1746   RBC 4.59 05/17/2020 1746   HGB 13.8 05/17/2020 1746   HGB 13.8 03/13/2019 1639   HCT 38.3 (L) 05/17/2020 1746   HCT 40.1 03/13/2019 1639   PLT 193 05/17/2020 1746   PLT 194 03/13/2019 1639   MCV 83.4 05/17/2020 1746   MCV 89 03/13/2019 1639   MCH 30.1 05/17/2020 1746   MCHC 36.0 05/17/2020 1746   RDW 13.3 05/17/2020 1746   RDW 13.5 03/13/2019 1639   LYMPHSABS 1.7 02/19/2015 2333   MONOABS 0.5 02/19/2015 2333   EOSABS 0.6 02/19/2015 2333   BASOSABS 0.0 02/19/2015 2333     ASSESSMENT AND PLAN:  1. Type 2 diabetes with nephropathy (HCC) Doing much better.  He will continue current dose of Lantus.  Encourage healthy eating habits.  He will start riding his stationary bike a few times a week for exercise. - POCT glucose (manual entry) - POCT glycosylated hemoglobin (Hb A1C)  2. Essential hypertension Not at goal.  He has been out of lisinopril.  Refill given.  He will continue his other medications - lisinopril (ZESTRIL) 40 MG tablet; Take 1 tablet (40 mg total) by mouth daily.  Dispense: 90 tablet; Refill: 3 - amLODipine (NORVASC) 10 MG tablet; Take 1 tablet (10 mg total) by mouth daily.  Dispense: 90 tablet; Refill: 3  3. Coronary artery disease involving native coronary artery of native heart without angina pectoris Clinically stable.  Continue carvedilol, isosorbide, atorvastatin and aspirin - isosorbide dinitrate (ISORDIL) 20 MG tablet; Take 2 tablets (40 mg total) by mouth 2 (two) times daily.  Dispense: 240 tablet; Refill: 0  4. Influenza vaccine refused Recommended.  Patient declined  5. Over weight See #1 above    Patient was given the opportunity to ask questions.  Patient verbalized understanding of the plan and was able to repeat key elements of the plan.   Orders Placed This Encounter  Procedures  . POCT glucose (manual entry)  . POCT glycosylated hemoglobin (Hb A1C)     Requested Prescriptions   Signed Prescriptions Disp Refills  . lisinopril (ZESTRIL) 40 MG tablet 90 tablet 3    Sig: Take 1 tablet (40 mg total) by mouth daily.  Marland Kitchen amLODipine (NORVASC) 10 MG tablet 90 tablet 3    Sig: Take 1 tablet (10 mg total) by mouth daily.  . isosorbide dinitrate (ISORDIL) 20 MG tablet  240 tablet 0    Sig: Take 2 tablets (40 mg total) by mouth 2 (two) times daily.    Return in about 4 months (around 01/08/2021).  Karle Plumber, MD, FACP

## 2020-09-09 NOTE — Progress Notes (Signed)
Pt is requesting 90 days of meds

## 2020-09-11 ENCOUNTER — Encounter: Payer: Self-pay | Admitting: Internal Medicine

## 2020-09-11 NOTE — Progress Notes (Signed)
I received a note from Dr. Danie Chandler.  Patient seen 09/03/2020.  Blood pressure on that visit was 126/86.  Diagnosis CKD stage III due to HTN and diabetes, hypertensive renal disease, diabetes with renal complications, anemia associated with chronic renal failure, proteinuria. Labs: BUN 44/creatinine 2.62/GFR of 29 Intact PTH 85.

## 2020-09-25 MED FILL — TRUEplus 5-BEVEL PEN NEEDLE: 32G X 4 MM | 90 days supply | Qty: 100 | Fill #2

## 2020-10-04 MED FILL — CARVEDILOL 25 MG TABLET: 25 | 90 days supply | Qty: 270 | Fill #2

## 2020-10-08 ENCOUNTER — Ambulatory Visit: Payer: Medicare Other | Admitting: Endocrinology

## 2020-10-25 MED FILL — LANTUS SOLOSTAR 100 UNITS/M: 100 | 84 days supply | Qty: 27 | Fill #4

## 2020-10-25 MED FILL — AMLODIPINE BESYLATE 10 MG T: 10 | 30 days supply | Qty: 30 | Fill #2

## 2020-10-29 ENCOUNTER — Other Ambulatory Visit: Payer: Self-pay

## 2020-10-31 ENCOUNTER — Telehealth (INDEPENDENT_AMBULATORY_CARE_PROVIDER_SITE_OTHER): Payer: Medicare Other | Admitting: Endocrinology

## 2020-10-31 ENCOUNTER — Other Ambulatory Visit: Payer: Self-pay

## 2020-10-31 DIAGNOSIS — N184 Chronic kidney disease, stage 4 (severe): Secondary | ICD-10-CM

## 2020-10-31 DIAGNOSIS — Z794 Long term (current) use of insulin: Secondary | ICD-10-CM

## 2020-10-31 DIAGNOSIS — E1122 Type 2 diabetes mellitus with diabetic chronic kidney disease: Secondary | ICD-10-CM | POA: Diagnosis not present

## 2020-10-31 NOTE — Patient Instructions (Addendum)
check your blood sugar twice a day.  vary the time of day when you check, between before the 3 meals, and at bedtime.  also check if you have symptoms of your blood sugar being too high or too low.  please keep a record of the readings and bring it to your next appointment here (or you can bring the meter itself).  You can write it on any piece of paper.  please call us sooner if your blood sugar goes below 70, or if you have a lot of readings over 200.  Please continue the same insulin  Please come back for a follow-up appointment in 1 month.

## 2020-10-31 NOTE — Progress Notes (Signed)
Subjective:    Patient ID: Timothy Lambert, male    DOB: November 27, 1955, 65 y.o.   MRN: [id number]  HPI  telehealth visit today via telephone x 6 minutes.  Alternatives to telehealth are presented to this patient, and the patient agrees to the telehealth visit. Pt is advised of the cost of the visit, and agrees to this, also.   Patient is at home, and I am at the office.   Persons attending the telehealth visit: the patient and I Pt returns for f/u of diabetes mellitus: DM type: Insulin-requiring type 2 Dx'ed: [id number] Complications: PDR, CAD, and stage 4 CRI Therapy: insulin since 2016.  DKA: never Severe hypoglycemia: never Pancreatitis: never Pancreatic imaging: never SDOH: he does not check cbg's Other: he declines multiple daily injections; fructosamine converts to A1c 2 pts higher than A1c itself. Interval history: He does not check cbg's, but he says  He had meter and strips.  pt states he feels well in general.  He says he can afford the insulin for now.  Pt says he never misses the insulin. Past Medical History:  Diagnosis Date  . Acute renal failure (ARF) ([place]) 02/19/2015  . CHF (congestive heart failure) ([place])   . Coronary artery disease   . GERD (gastroesophageal reflux disease)   . HLD (hyperlipidemia)   . Hypertension   . Myocardial infarction ([place]) 01/22/2015   "massive"  . Refusal of blood transfusions as patient is Jehovah's Witness   . Seizures ([place]) 01/22/2015   "in front of paramedics"  . Stroke [facility]) 01/22/2015   "short term memory loss & right side weak since" (02/19/2015)  . Type II diabetes mellitus ([place])     Past Surgical History:  Procedure Laterality Date  . CARDIAC CATHETERIZATION N/A 01/23/2015   Procedure: Left Heart Cath and Coronary Angiography;  Surgeon: [name], MD;  Location: [facility] INVASIVE CV LAB CUPID;  Service: Cardiovascular;  Laterality: N/A;  . CARDIAC CATHETERIZATION N/A 01/23/2015   Procedure: Coronary Stent Intervention;  Surgeon: [name], MD;  Location: [facility] INVASIVE CV LAB CUPID;  Service: Cardiovascular;  Laterality: N/A;    Social History   Socioeconomic History  . Marital status: Married    Spouse name: Not on file  . Number of children: Not on file  . Years of education: Not on file  . Highest education level: Not on file  Occupational History  . Not on file  Tobacco Use  . Smoking status: Former Smoker    Packs/day: 0.33    Years: 14.00    Pack years: 4.62    Types: Cigarettes  . Smokeless tobacco: Never Used  . Tobacco comment: "quit smoking cigarettes in 1992"  Substance and Sexual Activity  . Alcohol use: No    Comment: 02/19/2015 "stopped drinking 01/22/2015"  . Drug use: No  . Sexual activity: Not Currently  Other Topics Concern  . Not on file  Social History Narrative   Lives   Caffeine use: Coffee sometimes   Social Determinants of Health   Financial Resource Strain: Not on file  Food Insecurity: Not on file  Transportation Needs: Not on file  Physical Activity: Not on file  Stress: Not on file  Social Connections: Not on file  Intimate Partner Violence: Not on file    Current Outpatient Medications on File Prior to Visit  Medication Sig Dispense Refill  . amLODipine (NORVASC) 10 MG tablet Take 1 tablet (10 mg total) by mouth daily. 90 tablet 3  . aspirin  EC 81 MG tablet Take 1 tablet (81 mg total) by mouth daily. 100 tablet 1  . atorvastatin (LIPITOR) 40 MG tablet TAKE 1 TABLET (40 MG TOTAL) BY MOUTH DAILY AT 6 PM. 90 tablet 1  . carvedilol (COREG) 25 MG tablet Take 1.5 tablets (37.5 mg total) by mouth 2 (two) times daily with a meal. 90 tablet 5  . furosemide (LASIX) 20 MG tablet TAKE 1 TABLET (20 MG TOTAL) BY MOUTH DAILY. 90 tablet 1  . glucose blood (ACCU-CHEK GUIDE) test strip Use as instructed 3 times daily DX code E11.9 100 each 3  . hydrALAZINE (APRESOLINE) 50 MG tablet Take 1.5 tablets (75 mg total) by mouth 2 (two) times daily. 90 tablet 6  . Insulin Glargine (BASAGLAR  KWIKPEN) 100 UNIT/ML Inject 30 Units into the skin every morning. 15 mL 6  . Insulin Syringe-Needle U-100 (INSULIN SYRINGE .5CC/30GX1/2") 30G X 1/2" 0.5 ML MISC Check blood sugar TID & QHS 100 each 2  . isosorbide dinitrate (ISORDIL) 20 MG tablet Take 2 tablets (40 mg total) by mouth 2 (two) times daily. 240 tablet 0  . Lancets (ACCU-CHEK SOFT TOUCH) lancets Use as instructed 100 each 12  . Lancets (ACCU-CHEK SOFT TOUCH) lancets Use as instructed to check blood sugar 3 times daily Dx code E11.9 100 each 3  . lisinopril (ZESTRIL) 40 MG tablet Take 1 tablet (40 mg total) by mouth daily. 90 tablet 3  . TRUEPLUS LANCETS 28G MISC 1 each by Does not apply route 3 (three) times daily before meals. (Patient not taking: Reported on 08/05/2020) 100 each 12  . TRUEPLUS PEN NEEDLES 32G X 4 MM MISC USE TO INJECT INSULIN AS DIRECTED 100 each 5   No current facility-administered medications on file prior to visit.    Allergies  Allergen Reactions  . Trulicity [Dulaglutide]     Upset his stomach    Family History  Problem Relation Age of Onset  . Diabetes Father   . Seizures Neg Hx     There were no vitals taken for this visit.   Review of Systems He denies hypoglycemia sxs    Objective:   Physical Exam      Assessment & Plan:  Insulin-requiring type 2 DM, with stage 4 CRI: uncertain glycemic controle declines A1c today.   Patient Instructions  check your blood sugar twice a day.  vary the time of day when you check, between before the 3 meals, and at bedtime.  also check if you have symptoms of your blood sugar being too high or too low.  please keep a record of the readings and bring it to your next appointment here (or you can bring the meter itself).  You can write it on any piece of paper.  please call us sooner if your blood sugar goes below 70, or if you have a lot of readings over 200.  Please continue the same insulin  Please come back for a follow-up appointment in 1 month.

## 2020-11-22 ENCOUNTER — Other Ambulatory Visit: Payer: Self-pay | Admitting: Internal Medicine

## 2020-11-22 DIAGNOSIS — I2510 Atherosclerotic heart disease of native coronary artery without angina pectoris: Secondary | ICD-10-CM

## 2020-11-22 MED FILL — AMLODIPINE BESYLATE 10 MG T: 10 | 90 days supply | Qty: 90 | Fill #0

## 2020-11-22 NOTE — Telephone Encounter (Signed)
Requested medication (s) are due for refill today: Yes  Requested medication (s) are on the active medication list: Yes  Last refill: 05/30/20 (Atorvastatin); 11/14/19 (Hydralazine)  Future visit scheduled: No  Notes to clinic:  Unable to refill per protocol, Rx expired. Unable to refill per protocol due to failed labs, no updated results.     Requested Prescriptions  Pending Prescriptions Disp Refills   hydrALAZINE (APRESOLINE) 50 MG tablet [Pharmacy Med Name: hydrALAZINE HCL 50 MG TABS 50 Tablet] 270 tablet 6    Sig: Take 1.5 tablets (75 mg total) by mouth 2 (two) times daily.      Cardiovascular:  Vasodilators Failed - 11/22/2020 11:31 AM      Failed - HCT in normal range and within 360 days    HCT  Date Value Ref Range Status  05/17/2020 38.3 (L) 39.0 - 52.0 % Final   Hematocrit  Date Value Ref Range Status  03/13/2019 40.1 37.5 - 51.0 % Final          Failed - Last BP in normal range    BP Readings from Last 1 Encounters:  09/09/20 (!) 160/90          Passed - HGB in normal range and within 360 days    Hemoglobin  Date Value Ref Range Status  05/17/2020 13.8 13.0 - 17.0 g/dL Final  03/13/2019 13.8 13.0 - 17.7 g/dL Final          Passed - RBC in normal range and within 360 days    RBC  Date Value Ref Range Status  05/17/2020 4.59 4.22 - 5.81 MIL/uL Final          Passed - WBC in normal range and within 360 days    WBC  Date Value Ref Range Status  05/17/2020 9.5 4.0 - 10.5 K/uL Final          Passed - PLT in normal range and within 360 days    Platelets  Date Value Ref Range Status  05/17/2020 193 150 - 400 K/uL Final  03/13/2019 194 150 - 450 x10E3/uL Final          Passed - Valid encounter within last 12 months    Recent Outpatient Visits           2 months ago Type 2 diabetes with nephropathy [facility])   [place], Neoma Laming B, MD   2 months ago Acute bilateral low back pain without sciatica   [place], Deborah B, MD   7 months ago Type 2 diabetes mellitus with diabetic nephropathy, with long-term current use of insulin ([place])   [place], Deborah B, MD   8 months ago Essential hypertension   [place], Jarome Matin, RPH-CPP   9 months ago Essential hypertension   [place], Stephen L, RPH-CPP                  atorvastatin (LIPITOR) 40 MG tablet [Pharmacy Med Name: ATORVASTATIN CALCIUM 40 MG 40 Tablet] 90 tablet 1    Sig: TAKE 1 TABLET (40 MG TOTAL) BY MOUTH DAILY AT 6 PM.      Cardiovascular:  Antilipid - Statins Failed - 11/22/2020 11:31 AM      Failed - Total Cholesterol in normal range and within 360 days    Cholesterol, Total  Date Value Ref  Range Status  03/13/2019 174 100 - 199 mg/dL Final          Failed - LDL in normal range and within 360 days    LDL Calculated  Date Value Ref Range Status  03/13/2019 118 (H) 0 - 99 mg/dL Final          Failed - HDL in normal range and within 360 days    HDL  Date Value Ref Range Status  03/13/2019 38 (L) >39 mg/dL Final          Failed - Triglycerides in normal range and within 360 days    Triglycerides  Date Value Ref Range Status  03/13/2019 88 0 - 149 mg/dL Final          Passed - Patient is not pregnant      Passed - Valid encounter within last 12 months    Recent Outpatient Visits           2 months ago Type 2 diabetes with nephropathy [facility])   [place] Karle Plumber B, MD   2 months ago Acute bilateral low back pain without sciatica   [place], Deborah B, MD   7 months ago Type 2 diabetes mellitus with diabetic nephropathy, with long-term current use of insulin [facility])   [place], Deborah B, MD   8 months ago Essential hypertension    [place], Jarome Matin, RPH-CPP   9 months ago Essential hypertension   [place], RPH-CPP

## 2020-11-25 ENCOUNTER — Other Ambulatory Visit: Payer: Self-pay | Admitting: Internal Medicine

## 2020-11-25 MED FILL — ATORVASTATIN CALCIUM 40 MG: 40 | 30 days supply | Qty: 30 | Fill #0

## 2020-11-25 MED FILL — hydrALAZINE HCL 50 MG TABS: 50 | 30 days supply | Qty: 90 | Fill #0

## 2020-12-13 ENCOUNTER — Other Ambulatory Visit: Payer: Self-pay | Admitting: Internal Medicine

## 2020-12-13 DIAGNOSIS — I2510 Atherosclerotic heart disease of native coronary artery without angina pectoris: Secondary | ICD-10-CM

## 2020-12-13 MED FILL — ISOSORBIDE DN 20 MG TABLET: 20 | 30 days supply | Qty: 120 | Fill #0

## 2020-12-13 MED FILL — LISINOPRIL 40 MG TAB: 40 | 90 days supply | Qty: 90 | Fill #1

## 2020-12-13 MED FILL — FUROSEMIDE 20 MG TABS: 20 | 90 days supply | Qty: 90 | Fill #1

## 2020-12-13 NOTE — Telephone Encounter (Signed)
Requested Prescriptions  Pending Prescriptions Disp Refills  . isosorbide dinitrate (ISORDIL) 20 MG tablet [Pharmacy Med Name: ISOSORBIDE DINITRATE 20 MG 20 Tablet] 240 tablet 0    Sig: TAKE 2 TABLETS (40 MG TOTAL) BY MOUTH 2 (TWO) TIMES DAILY.     Cardiovascular:  Nitrates Failed - 12/13/2020 11:20 AM      Failed - Last BP in normal range    BP Readings from Last 1 Encounters:  09/09/20 (!) 160/90         Passed - Last Heart Rate in normal range    Pulse Readings from Last 1 Encounters:  09/09/20 78         Passed - Valid encounter within last 12 months    Recent Outpatient Visits          3 months ago Type 2 diabetes with nephropathy [facility])   [place] Karle Plumber B, MD   3 months ago Acute bilateral low back pain without sciatica   [place], MD   7 months ago Type 2 diabetes mellitus with diabetic nephropathy, with long-term current use of insulin [facility])   [place], Deborah B, MD   9 months ago Essential hypertension   [place], Jarome Matin, RPH-CPP   10 months ago Essential hypertension   [place], RPH-CPP

## 2020-12-21 ENCOUNTER — Other Ambulatory Visit: Payer: Self-pay

## 2021-01-08 ENCOUNTER — Other Ambulatory Visit: Payer: Self-pay | Admitting: Internal Medicine

## 2021-01-08 ENCOUNTER — Other Ambulatory Visit: Payer: Self-pay

## 2021-01-08 DIAGNOSIS — I2510 Atherosclerotic heart disease of native coronary artery without angina pectoris: Secondary | ICD-10-CM

## 2021-01-08 MED ORDER — ATORVASTATIN CALCIUM 40 MG PO TABS
ORAL_TABLET | Freq: Every day | ORAL | 0 refills | Status: DC
  Filled 2021-01-08: qty 30, 30d supply, fill #0

## 2021-01-08 MED FILL — Isosorbide Dinitrate Tab 20 MG: ORAL | 60 days supply | Qty: 240 | Fill #0 | Status: AC

## 2021-01-08 MED FILL — Insulin Pen Needle 32 G X 4 MM (1/6" or 5/32"): 25 days supply | Qty: 100 | Fill #0 | Status: AC

## 2021-02-07 ENCOUNTER — Other Ambulatory Visit: Payer: Self-pay

## 2021-02-07 ENCOUNTER — Other Ambulatory Visit: Payer: Self-pay | Admitting: Internal Medicine

## 2021-02-07 DIAGNOSIS — I2510 Atherosclerotic heart disease of native coronary artery without angina pectoris: Secondary | ICD-10-CM

## 2021-02-07 MED ORDER — ATORVASTATIN CALCIUM 40 MG PO TABS
ORAL_TABLET | Freq: Every day | ORAL | 0 refills | Status: DC
  Filled 2021-02-07: qty 30, 30d supply, fill #0

## 2021-02-07 MED FILL — Insulin Glargine Soln Pen-Injector 100 Unit/ML: SUBCUTANEOUS | 28 days supply | Qty: 9 | Fill #0 | Status: CN

## 2021-02-07 MED FILL — Insulin Glargine Soln Pen-Injector 100 Unit/ML: SUBCUTANEOUS | 56 days supply | Qty: 18 | Fill #0 | Status: AC

## 2021-02-11 ENCOUNTER — Ambulatory Visit: Payer: Medicare Other | Attending: Internal Medicine | Admitting: Internal Medicine

## 2021-02-11 ENCOUNTER — Encounter: Payer: Self-pay | Admitting: Internal Medicine

## 2021-02-11 ENCOUNTER — Other Ambulatory Visit: Payer: Self-pay

## 2021-02-11 VITALS — BP 144/74 | HR 73 | Resp 16 | Wt 183.2 lb

## 2021-02-11 DIAGNOSIS — E1122 Type 2 diabetes mellitus with diabetic chronic kidney disease: Secondary | ICD-10-CM

## 2021-02-11 DIAGNOSIS — I5042 Chronic combined systolic (congestive) and diastolic (congestive) heart failure: Secondary | ICD-10-CM | POA: Diagnosis not present

## 2021-02-11 DIAGNOSIS — Z23 Encounter for immunization: Secondary | ICD-10-CM

## 2021-02-11 DIAGNOSIS — N1832 Chronic kidney disease, stage 3b: Secondary | ICD-10-CM | POA: Diagnosis not present

## 2021-02-11 DIAGNOSIS — I10 Essential (primary) hypertension: Secondary | ICD-10-CM | POA: Diagnosis not present

## 2021-02-11 DIAGNOSIS — Z794 Long term (current) use of insulin: Secondary | ICD-10-CM

## 2021-02-11 DIAGNOSIS — I480 Paroxysmal atrial fibrillation: Secondary | ICD-10-CM

## 2021-02-11 DIAGNOSIS — I2510 Atherosclerotic heart disease of native coronary artery without angina pectoris: Secondary | ICD-10-CM

## 2021-02-11 DIAGNOSIS — I25119 Atherosclerotic heart disease of native coronary artery with unspecified angina pectoris: Secondary | ICD-10-CM | POA: Diagnosis not present

## 2021-02-11 LAB — POCT GLYCOSYLATED HEMOGLOBIN (HGB A1C): HbA1c, POC (controlled diabetic range): 6.9 % (ref 0.0–7.0)

## 2021-02-11 LAB — GLUCOSE, POCT (MANUAL RESULT ENTRY): POC Glucose: 223 mg/dl — AB (ref 70–99)

## 2021-02-11 MED ORDER — HYDRALAZINE HCL 50 MG PO TABS
ORAL_TABLET | ORAL | 2 refills | Status: DC
  Filled 2021-02-11 – 2021-02-28 (×2): qty 270, 90d supply, fill #0

## 2021-02-11 MED ORDER — ISOSORBIDE DINITRATE 20 MG PO TABS
ORAL_TABLET | ORAL | 2 refills | Status: DC
  Filled 2021-02-11: qty 360, fill #0
  Filled 2021-05-01: qty 360, 90d supply, fill #0

## 2021-02-11 MED ORDER — FUROSEMIDE 20 MG PO TABS
ORAL_TABLET | Freq: Every day | ORAL | 1 refills | Status: DC
  Filled 2021-02-11 – 2021-03-14 (×2): qty 90, 90d supply, fill #0
  Filled 2021-06-20: qty 90, 90d supply, fill #1

## 2021-02-11 NOTE — Patient Instructions (Signed)
Please remember to turn in your stool card for colon cancer screening. Please remember to call and schedule your eye appointment. Please remember to get your COVID-19 booster shot.

## 2021-02-11 NOTE — Progress Notes (Signed)
Patient ID: Timothy Lambert, male    DOB: [birth year]  MRN: [id number]  CC: Diabetes and Hypertension   Subjective: Timothy Lambert is a 65 y.o. male who presents for chronic ds management His concerns today include:  Pt with hx of DMwith retinopathy and nephropathy, HTN, HL, CAD with DES LAD 2016,embolicCVA, PAF (thought to be a poor candidate for anticoagulation because of history of EtOH abuse at the time of his CVA in 2016) ETOH withdrawal sz, CKD stage 3, macroalbuminuria.  HYPERTENSION/CAD/HL/PAF Currently taking: see medication list. Reports compliance with Coreg, Hydralzine, Norvasc.  Taking Isosorbide 2 tabs in a.m and one in p.m just to stretch it out.  He is supposed to be taking 2 tablets twice a day.  Just picked up RF on Lipitor - he was stretching it out also until he was able to get refills today. Med Adherence: '[]'$  Yes    '[]'$  No Medication side effects: '[]'$  Yes    '[x]'$  No Adherence with salt restriction: '[x]'$  Yes    '[]'$  No Home Monitoring?: '[x]'$  Yes    '[]'$  No Monitoring Frequency: once a wk Home BP results range: "Not too bad SBP 150-160 SOB? '[]'$  Yes    '[x]'$  No Chest Pain?: '[]'$  Yes    '[x]'$  No Leg swelling?: '[]'$  Yes    '[x]'$  No Headaches?: '[]'$  Yes    '[x]'$  No Dizziness? '[]'$  Yes    '[x]'$  No Comments: Denies any palpitations.  History of paroxysmal atrial fib but not on anticoagulation.  DM Results for orders placed or performed in visit on 02/11/21  POCT glucose (manual entry)  Result Value Ref Range   POC Glucose 223 (A) 70 - 99 mg/dl  POCT glycosylated hemoglobin (Hb A1C)  Result Value Ref Range   Hemoglobin A1C     HbA1c POC (<> result, manual entry)     HbA1c, POC (prediabetic range)     HbA1c, POC (controlled diabetic range) 6.9 0.0 - 7.0 %  Currently on 26 units of Glargine Checks BS but not often Reports he has decrease his portions.  Drinks water and diet sodas. Active working in his yard Will schedule eye exam with his ophthalmologist in [facility] before our  next visit.  CKD:  Has appt with nephrologist next mth.  Goes Q 6 mths.  Still making good urine.  Not on NSAIDs.  HM:  No COVID booster as yet plans to get it done. Insurance sent stool card but he has not sent in as yet.  Declines colonoscopy.  It has been a struggle getting him to do some form of colon cancer screening.  Patient Active Problem List   Diagnosis Date Noted  . Influenza vaccine refused 09/09/2020  . Diabetes ([place]) 06/29/2020  . CKD (chronic kidney disease) stage 3, GFR 30-59 ml/min (HCC) 02/15/2018  . Macroalbuminuric diabetic nephropathy ([place]) 02/15/2018  . Diabetic retinopathy ([place]) 11/18/2015  . Paroxysmal atrial fibrillation ([place]) 10/28/2015  . Cognitive deficit, post-stroke 05/13/2015  . Essential hypertension 02/19/2015  . CAD (coronary artery disease) 02/19/2015  . 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 CVA (cerebral vascular accident) ([place])   . Combined systolic and diastolic congestive heart failure ([place])   . Seizure [facility])      Current Outpatient Medications on File Prior to Visit  Medication Sig Dispense Refill  . amLODipine (NORVASC) 10 MG tablet TAKE 1 TABLET (10 MG TOTAL) BY MOUTH DAILY. 90 tablet 3  . aspirin EC 81 MG tablet Take 1 tablet (81 mg  total) by mouth daily. 100 tablet 1  . atorvastatin (LIPITOR) 40 MG tablet TAKE 1 TABLET (40 MG TOTAL) BY MOUTH DAILY AT 6 PM. 30 tablet 0  . carvedilol (COREG) 25 MG tablet TAKE 1.5 TABLETS (37.5 MG TOTAL) BY MOUTH 2 (TWO) TIMES DAILY WITH A MEAL. 90 tablet 5  . glucose blood (ACCU-CHEK GUIDE) test strip Use as instructed 3 times daily DX code E11.9 100 each 3  . Insulin Glargine (BASAGLAR KWIKPEN) 100 UNIT/ML Inject 30 Units into the skin every morning. 15 mL 6  . insulin glargine (LANTUS) 100 UNIT/ML Solostar Pen INJECT 32 UNITS INTO THE SKIN AT BEDTIME. 18 mL 6  . Insulin Syringe-Needle U-100 (INSULIN SYRINGE .5CC/30GX1/2") 30G X 1/2" 0.5 ML MISC Check blood sugar TID & QHS 100 each 2  . Lancets  (ACCU-CHEK SOFT TOUCH) lancets Use as instructed 100 each 12  . Lancets (ACCU-CHEK SOFT TOUCH) lancets Use as instructed to check blood sugar 3 times daily Dx code E11.9 100 each 3  . lisinopril (ZESTRIL) 40 MG tablet TAKE 1 TABLET (40 MG TOTAL) BY MOUTH DAILY. 90 tablet 3  . TRUEPLUS LANCETS 28G MISC 1 each by Does not apply route 3 (three) times daily before meals. (Patient not taking: Reported on 08/05/2020) 100 each 12   No current facility-administered medications on file prior to visit.    Allergies  Allergen Reactions  . Trulicity [Dulaglutide]     Upset his stomach    Social History   Socioeconomic History  . Marital status: Married    Spouse name: Not on file  . Number of children: Not on file  . Years of education: Not on file  . Highest education level: Not on file  Occupational History  . Not on file  Tobacco Use  . Smoking status: Former Smoker    Packs/day: 0.33    Years: 14.00    Pack years: 4.62    Types: Cigarettes  . Smokeless tobacco: Never Used  . Tobacco comment: "quit smoking cigarettes in 1992"  Substance and Sexual Activity  . Alcohol use: No    Comment: 02/19/2015 "stopped drinking 01/22/2015"  . Drug use: No  . Sexual activity: Not Currently  Other Topics Concern  . Not on file  Social History Narrative   Lives   Caffeine use: Coffee sometimes   Social Determinants of Health   Financial Resource Strain: Not on file  Food Insecurity: Not on file  Transportation Needs: Not on file  Physical Activity: Not on file  Stress: Not on file  Social Connections: Not on file  Intimate Partner Violence: Not on file    Family History  Problem Relation Age of Onset  . Diabetes Father   . Seizures Neg Hx     Past Surgical History:  Procedure Laterality Date  . CARDIAC CATHETERIZATION N/A 01/23/2015   Procedure: Left Heart Cath and Coronary Angiography;  Surgeon: [name], MD;  Location: [facility] INVASIVE CV LAB CUPID;  Service: Cardiovascular;   Laterality: N/A;  . CARDIAC CATHETERIZATION N/A 01/23/2015   Procedure: Coronary Stent Intervention;  Surgeon: [name], MD;  Location: [facility] INVASIVE CV LAB CUPID;  Service: Cardiovascular;  Laterality: N/A;    ROS: Review of Systems Negative except as stated above  PHYSICAL EXAM: BP (!) 144/74   Pulse 73   Resp 16   Wt 183 lb 3.2 oz (83.1 kg)   SpO2 97%   BMI 29.57 kg/m   Wt Readings from Last 3 Encounters:  02/11/21 183  lb 3.2 oz (83.1 kg)  09/09/20 179 lb 9.6 oz (81.5 kg)  08/05/20 182 lb (82.6 kg)    Physical Exam General appearance - alert, well appearing, older African-American male and in no distress Neck - supple, no significant adenopathy Chest - clear to auscultation, no wheezes, rales or rhonchi, symmetric air entry Heart - normal rate, regular rhythm, normal S1, S2, no murmurs, rubs, clicks or gallops Extremities -no lower extremity edema Diabetic Foot Exam - Simple   Simple Foot Form Visual Inspection See comments: Yes Sensation Testing Intact to touch and monofilament testing bilaterally: Yes Pulse Check Posterior Tibialis and Dorsalis pulse intact bilaterally: Yes Comments Toenails are overgrown.     CMP Latest Ref Rng & Units 05/17/2020 11/17/2019 03/13/2019  Glucose 70 - 99 mg/dL 93 197(H) 242(H)  BUN 8 - 23 mg/dL 42(H) 38(H) 33(H)  Creatinine 0.61 - 1.24 mg/dL 2.39(H) 2.30(H) 2.05(H)  Sodium 135 - 145 mmol/L 140 140 139  Potassium 3.5 - 5.1 mmol/L 4.3 4.7 4.7  Chloride 98 - 111 mmol/L 107 105 104  CO2 22 - 32 mmol/L '23 22 21  '$ Calcium 8.9 - 10.3 mg/dL 9.2 8.7 9.6  Total Protein 6.5 - 8.1 g/dL 7.6 - 6.6  Total Bilirubin 0.3 - 1.2 mg/dL 1.0 - 0.4  Alkaline Phos 38 - 126 U/L 52 - 86  AST 15 - 41 U/L 26 - 17  ALT 0 - 44 U/L 22 - 22   Lipid Panel     Component Value Date/Time   CHOL 174 03/13/2019 1639   TRIG 88 03/13/2019 1639   HDL 38 (L) 03/13/2019 1639   CHOLHDL 4.6 03/13/2019 1639   CHOLHDL 3.0 04/25/2015 1216   VLDL 17 04/25/2015 1216    LDLCALC 118 (H) 03/13/2019 1639    CBC    Component Value Date/Time   WBC 9.5 05/17/2020 1746   RBC 4.59 05/17/2020 1746   HGB 13.8 05/17/2020 1746   HGB 13.8 03/13/2019 1639   HCT 38.3 (L) 05/17/2020 1746   HCT 40.1 03/13/2019 1639   PLT 193 05/17/2020 1746   PLT 194 03/13/2019 1639   MCV 83.4 05/17/2020 1746   MCV 89 03/13/2019 1639   MCH 30.1 05/17/2020 1746   MCHC 36.0 05/17/2020 1746   RDW 13.3 05/17/2020 1746   RDW 13.5 03/13/2019 1639   LYMPHSABS 1.7 02/19/2015 2333   MONOABS 0.5 02/19/2015 2333   EOSABS 0.6 02/19/2015 2333   BASOSABS 0.0 02/19/2015 2333    ASSESSMENT AND PLAN: 1. Type 2 diabetes mellitus with stage 3b chronic kidney disease, with long-term current use of insulin (HCC) A1c is at goal.  He will continue current dose of insulin.  Commended him on trying to change his eating habits further nutrition counseling given.  Try to stay as active as he can.  Keep follow-up appointment with nephrology.  He declines blood test today as he states he will have them done when he sees the nephrologist next month. -Strongly encouraged him to get his eye exam done as soon as possible. - POCT glucose (manual entry) - POCT glycosylated hemoglobin (Hb A1C)  2. Coronary artery disease involving native coronary artery of native heart with angina pectoris Stable.  Encouraged him to take the isosorbide 2 tablets twice a day as prescribed.  Continue Lipitor and aspirin - isosorbide dinitrate (ISORDIL) 20 MG tablet; TAKE 2 TABLETS (40 MG TOTAL) BY MOUTH 2 (TWO) TIMES DAILY.  Dispense: 360 tablet; Refill: 2  3. Malignant hypertension Blood pressure not  at goal but much improved compared to previous visits.  Encouraged him to take the isosorbide as prescribed.  Continue other blood pressure medications including hydralazine, carvedilol, lisinopril and amlodipine - hydrALAZINE (APRESOLINE) 50 MG tablet; TAKE 1.5 TABLETS (75 MG TOTAL) BY MOUTH 2 (TWO) TIMES DAILY.  Dispense: 270  tablet; Refill: 2 - furosemide (LASIX) 20 MG tablet; TAKE 1 TABLET (20 MG TOTAL) BY MOUTH DAILY.  Dispense: 90 tablet; Refill: 1  4. Chronic combined systolic and diastolic congestive heart failure (HCC) Clinically stable and compensated.  Continue to limit salt in the foods.  Continue furosemide, isosorbide, hydralazine and carvedilol  5. Paroxysmal atrial fibrillation (HCC) Sounds to be in sinus rhythm.  Deemed not a candidate for anticoagulation  6. COVID-19 vaccine series started Encouraged him to get COVID-19 vaccine booster as soon as possible.  Encouraged him to turn in the stool cards for his colon cancer screening.  Patient was given the opportunity to ask questions.  Patient verbalized understanding of the plan and was able to repeat key elements of the plan.   Orders Placed This Encounter  Procedures  . POCT glucose (manual entry)  . POCT glycosylated hemoglobin (Hb A1C)     Requested Prescriptions   Signed Prescriptions Disp Refills  . isosorbide dinitrate (ISORDIL) 20 MG tablet 360 tablet 2    Sig: TAKE 2 TABLETS (40 MG TOTAL) BY MOUTH 2 (TWO) TIMES DAILY.  . hydrALAZINE (APRESOLINE) 50 MG tablet 270 tablet 2    Sig: TAKE 1.5 TABLETS (75 MG TOTAL) BY MOUTH 2 (TWO) TIMES DAILY.  . furosemide (LASIX) 20 MG tablet 90 tablet 1    Sig: TAKE 1 TABLET (20 MG TOTAL) BY MOUTH DAILY.    Return in about 5 months (around 07/14/2021) for Give appt with [facility] in mid September for [facility] Wellness visit.  Karle Plumber, MD, FACP

## 2021-02-18 ENCOUNTER — Other Ambulatory Visit: Payer: Self-pay

## 2021-02-28 ENCOUNTER — Other Ambulatory Visit: Payer: Self-pay

## 2021-02-28 MED FILL — Amlodipine Besylate Tab 10 MG (Base Equivalent): ORAL | 90 days supply | Qty: 90 | Fill #0 | Status: AC

## 2021-03-14 ENCOUNTER — Other Ambulatory Visit: Payer: Self-pay | Admitting: Internal Medicine

## 2021-03-14 ENCOUNTER — Other Ambulatory Visit: Payer: Self-pay

## 2021-03-14 DIAGNOSIS — I2510 Atherosclerotic heart disease of native coronary artery without angina pectoris: Secondary | ICD-10-CM

## 2021-03-14 MED ORDER — ATORVASTATIN CALCIUM 40 MG PO TABS
40.0000 mg | ORAL_TABLET | Freq: Every day | ORAL | 1 refills | Status: DC
  Filled 2021-03-14 – 2021-03-21 (×2): qty 90, 90d supply, fill #0
  Filled 2021-06-20: qty 90, 90d supply, fill #1

## 2021-03-14 MED FILL — Lisinopril Tab 40 MG: ORAL | 90 days supply | Qty: 90 | Fill #0 | Status: AC

## 2021-03-14 NOTE — Telephone Encounter (Signed)
   Notes to clinic:  Patient has appt on 06/04/2021 Review for enough medication until that appt    Requested Prescriptions  Pending Prescriptions Disp Refills   atorvastatin (LIPITOR) 40 MG tablet 30 tablet 0    Sig: TAKE 1 TABLET (40 MG TOTAL) BY MOUTH DAILY AT 6 PM.      Cardiovascular:  Antilipid - Statins Failed - 03/14/2021  9:18 AM      Failed - Total Cholesterol in normal range and within 360 days    Cholesterol, Total  Date Value Ref Range Status  03/13/2019 174 100 - 199 mg/dL Final          Failed - LDL in normal range and within 360 days    LDL Calculated  Date Value Ref Range Status  03/13/2019 118 (H) 0 - 99 mg/dL Final          Failed - HDL in normal range and within 360 days    HDL  Date Value Ref Range Status  03/13/2019 38 (L) >39 mg/dL Final          Failed - Triglycerides in normal range and within 360 days    Triglycerides  Date Value Ref Range Status  03/13/2019 88 0 - 149 mg/dL Final          Passed - Patient is not pregnant      Passed - Valid encounter within last 12 months    Recent Outpatient Visits           1 month ago Type 2 diabetes mellitus with stage 3b chronic kidney disease, with long-term current use of insulin ([place])   [place] Karle Plumber B, MD   6 months ago Type 2 diabetes with nephropathy [facility])   [place] Karle Plumber B, MD   6 months ago Acute bilateral low back pain without sciatica   [place], Deborah B, MD   10 months ago Type 2 diabetes mellitus with diabetic nephropathy, with long-term current use of insulin [facility])   [place], Deborah B, MD   1 year ago Essential hypertension   [place], Stephen L, RPH-CPP       Future Appointments             In 2 months Daisy Blossom, Jarome Matin, [place]   In 4 months Wynetta Emery, Dalbert Batman, MD [place]

## 2021-03-21 ENCOUNTER — Other Ambulatory Visit: Payer: Self-pay

## 2021-04-01 ENCOUNTER — Other Ambulatory Visit: Payer: Self-pay

## 2021-04-01 MED FILL — Carvedilol Tab 25 MG: ORAL | 30 days supply | Qty: 90 | Fill #0 | Status: AC

## 2021-04-07 ENCOUNTER — Other Ambulatory Visit: Payer: Self-pay

## 2021-04-11 ENCOUNTER — Other Ambulatory Visit: Payer: Self-pay | Admitting: Internal Medicine

## 2021-04-11 ENCOUNTER — Other Ambulatory Visit: Payer: Self-pay

## 2021-04-11 MED ORDER — LANTUS SOLOSTAR 100 UNIT/ML ~~LOC~~ SOPN
32.0000 [IU] | PEN_INJECTOR | Freq: Every day | SUBCUTANEOUS | 2 refills | Status: DC
  Filled 2021-04-11: qty 18, 56d supply, fill #0
  Filled 2021-04-16: qty 24, 75d supply, fill #0
  Filled 2021-07-18: qty 21, 65d supply, fill #1

## 2021-04-11 NOTE — Telephone Encounter (Signed)
  Notes to clinic:  This request has changes from the previous prescription   Requested Prescriptions  Pending Prescriptions Disp Refills   insulin glargine (LANTUS SOLOSTAR) 100 UNIT/ML Solostar Pen 18 mL 6    Sig: INJECT 32 UNITS INTO THE SKIN AT BEDTIME.      Endocrinology:  Diabetes - Insulins Passed - 04/11/2021  9:03 AM      Passed - HBA1C is between 0 and 7.9 and within 180 days    HbA1c, POC (controlled diabetic range)  Date Value Ref Range Status  02/11/2021 6.9 0.0 - 7.0 % Final          Passed - Valid encounter within last 6 months    Recent Outpatient Visits           1 month ago Type 2 diabetes mellitus with stage 3b chronic kidney disease, with long-term current use of insulin ([place])   [place] Karle Plumber B, MD   7 months ago Type 2 diabetes with nephropathy [facility])   [place] Karle Plumber B, MD   7 months ago Acute bilateral low back pain without sciatica   [place], MD   11 months ago Type 2 diabetes mellitus with diabetic nephropathy, with long-term current use of insulin [facility])   [place], Deborah B, MD   1 year ago Essential hypertension   [place], Stephen L, RPH-CPP       Future Appointments             In 1 month Daisy Blossom, Jarome Matin, [employer]   In 3 months Wynetta Emery, Dalbert Batman, MD [place]

## 2021-04-16 ENCOUNTER — Other Ambulatory Visit: Payer: Self-pay | Admitting: Internal Medicine

## 2021-04-16 ENCOUNTER — Other Ambulatory Visit: Payer: Self-pay

## 2021-04-16 DIAGNOSIS — E1165 Type 2 diabetes mellitus with hyperglycemia: Secondary | ICD-10-CM

## 2021-04-16 MED ORDER — TECHLITE PEN NEEDLES 32G X 4 MM MISC
5 refills | Status: DC
  Filled 2021-04-16: qty 100, 25d supply, fill #0
  Filled 2021-06-09 – 2021-12-29 (×2): qty 100, 30d supply, fill #0

## 2021-04-16 NOTE — Telephone Encounter (Signed)
  Notes to clinic:  review for requested change from pharmacy    Requested Prescriptions  Pending Prescriptions Disp Refills   Insulin Pen Needle (TRUEPLUS PEN NEEDLES) 32G X 4 MM MISC 100 each 5    Sig: USE TO INJECT INSULIN AS DIRECTED      Endocrinology: Diabetes - Testing Supplies Passed - 04/16/2021  9:17 AM      Passed - Valid encounter within last 12 months    Recent Outpatient Visits           2 months ago Type 2 diabetes mellitus with stage 3b chronic kidney disease, with long-term current use of insulin ([place])   [place] Karle Plumber B, MD   7 months ago Type 2 diabetes with nephropathy [facility])   [place] Karle Plumber B, MD   7 months ago Acute bilateral low back pain without sciatica   [place], MD   12 months ago Type 2 diabetes mellitus with diabetic nephropathy, with long-term current use of insulin [facility])   [place], Deborah B, MD   1 year ago Essential hypertension   [place], Stephen L, RPH-CPP       Future Appointments             In 1 month Daisy Blossom, Jarome Matin, [place]   In 2 months Wynetta Emery, Dalbert Batman, MD [place]

## 2021-04-18 ENCOUNTER — Other Ambulatory Visit: Payer: Self-pay

## 2021-04-23 ENCOUNTER — Other Ambulatory Visit: Payer: Self-pay

## 2021-05-01 ENCOUNTER — Other Ambulatory Visit: Payer: Self-pay

## 2021-05-02 ENCOUNTER — Other Ambulatory Visit: Payer: Self-pay

## 2021-05-06 ENCOUNTER — Other Ambulatory Visit: Payer: Self-pay

## 2021-06-04 ENCOUNTER — Ambulatory Visit: Payer: Medicare Other | Attending: Internal Medicine | Admitting: Pharmacist

## 2021-06-04 ENCOUNTER — Encounter: Payer: Self-pay | Admitting: Pharmacist

## 2021-06-04 ENCOUNTER — Other Ambulatory Visit: Payer: Self-pay

## 2021-06-04 VITALS — BP 205/97 | HR 77 | Temp 98.3°F | Ht 67.0 in | Wt 174.4 lb

## 2021-06-04 DIAGNOSIS — I10 Essential (primary) hypertension: Secondary | ICD-10-CM

## 2021-06-04 DIAGNOSIS — I2510 Atherosclerotic heart disease of native coronary artery without angina pectoris: Secondary | ICD-10-CM | POA: Diagnosis not present

## 2021-06-04 DIAGNOSIS — Z0000 Encounter for general adult medical examination without abnormal findings: Secondary | ICD-10-CM | POA: Diagnosis not present

## 2021-06-04 MED ORDER — CARVEDILOL 25 MG PO TABS
ORAL_TABLET | ORAL | 5 refills | Status: DC
  Filled 2021-06-04: qty 180, 90d supply, fill #0

## 2021-06-04 MED FILL — Amlodipine Besylate Tab 10 MG (Base Equivalent): ORAL | 90 days supply | Qty: 90 | Fill #1 | Status: AC

## 2021-06-04 NOTE — Progress Notes (Signed)
Subjective:   Timothy Lambert is a 65 y.o. male who presents for Medicare Annual/Subsequent preventive examination.  Cardiac Risk Factors include: advanced age (>56mn, >>45women);diabetes mellitus;hypertension;male gender;sedentary lifestyle   Objective:    Today's Vitals   06/04/21 1014  BP: (!) 205/97  Pulse: 77  Temp: 98.3 F (36.8 C)  SpO2: 100%  Weight: 174 lb 6.4 oz (79.1 kg)  Height: '5\' 7"'$  (1.702 m)   Body mass index is 27.31 kg/m.  Advanced Directives 06/04/2021 05/28/2020 05/17/2020 11/16/2017 12/19/2015 09/06/2015 05/13/2015  Does Patient Have a Medical Advance Directive? Yes Yes Yes No No No;Yes Yes  Type of [place] Healthcare Power of Attorney Living will - - Healthcare Power of [place] will  Does patient want to make changes to medical advance directive? No - Patient declined - No - Patient declined - - - -  Copy of [place]? No - copy requested No - copy requested - - - Yes No - copy requested  Would patient like information on creating a medical advance directive? - - No - Patient declined No - Patient declined - - -    Current Medications (verified) Outpatient Encounter Medications as of 06/04/2021  Medication Sig   amLODipine (NORVASC) 10 MG tablet TAKE 1 TABLET (10 MG TOTAL) BY MOUTH DAILY.   aspirin EC 81 MG tablet Take 1 tablet (81 mg total) by mouth daily.   atorvastatin (LIPITOR) 40 MG tablet Take 1 tablet (40 mg total) by mouth daily at 6 pm.   furosemide (LASIX) 20 MG tablet TAKE 1 TABLET (20 MG TOTAL) BY MOUTH DAILY.   glucose blood (ACCU-CHEK GUIDE) test strip Use as instructed 3 times daily DX code E11.9   hydrALAZINE (APRESOLINE) 50 MG tablet TAKE 1.5 TABLETS (75 MG TOTAL) BY MOUTH 2 (TWO) TIMES DAILY.   insulin glargine (LANTUS SOLOSTAR) 100 UNIT/ML Solostar Pen Inject 32 Units into the skin daily.   Insulin Pen Needle (TRUEPLUS 5-BEVEL  PEN NEEDLES) 32G X 4 MM MISC USE AS DIRECTED FOR INSULIN   Insulin Syringe-Needle U-100 (INSULIN SYRINGE .5CC/30GX1/2") 30G X 1/2" 0.5 ML MISC Check blood sugar TID & QHS   isosorbide dinitrate (ISORDIL) 20 MG tablet TAKE 2 TABLETS (40 MG TOTAL) BY MOUTH 2 (TWO) TIMES DAILY.   Lancets (ACCU-CHEK SOFT TOUCH) lancets Use as instructed   Lancets (ACCU-CHEK SOFT TOUCH) lancets Use as instructed to check blood sugar 3 times daily Dx code E11.9   lisinopril (ZESTRIL) 40 MG tablet TAKE 1 TABLET (40 MG TOTAL) BY MOUTH DAILY.   TRUEPLUS LANCETS 28G MISC 1 each by Does not apply route 3 (three) times daily before meals.   carvedilol (COREG) 25 MG tablet TAKE 1.5 TABLETS (37.5 MG TOTAL) BY MOUTH 2 (TWO) TIMES DAILY WITH A MEAL.   [DISCONTINUED] carvedilol (COREG) 25 MG tablet TAKE 1.5 TABLETS (37.5 MG TOTAL) BY MOUTH 2 (TWO) TIMES DAILY WITH A MEAL.   No facility-administered encounter medications on file as of 06/04/2021.    Allergies (verified) Trulicity [dulaglutide]   History: Past Medical History:  Diagnosis Date   Acute renal failure (ARF) ([place] 02/19/2015   CHF (congestive heart failure) (HCC)    Coronary artery disease    GERD (gastroesophageal reflux disease)    HLD (hyperlipidemia)    Hypertension    [place]ocardial infarction ([place] 01/22/2015   "massive"   Refusal of blood transfusions as patient is Jehovah's Witness  Seizures ([place]) 01/22/2015   "in front of paramedics"   Stroke ([place]) 01/22/2015   "short term memory loss & right side weak since" (02/19/2015)   Type II diabetes mellitus ([place])    Past Surgical History:  Procedure Laterality Date   CARDIAC CATHETERIZATION N/A 01/23/2015   Procedure: Left Heart Cath and Coronary Angiography;  Surgeon: [name], MD;  Location: [facility] INVASIVE CV LAB CUPID;  Service: Cardiovascular;  Laterality: N/A;   CARDIAC CATHETERIZATION N/A 01/23/2015   Procedure: Coronary Stent Intervention;  Surgeon: [name], MD;  Location: [facility] INVASIVE CV LAB CUPID;   Service: Cardiovascular;  Laterality: N/A;   Family History  Problem Relation Age of Onset   Diabetes Father    Seizures Neg Hx    Social History   Socioeconomic History   Marital status: Married    Spouse name: Not on file   Number of children: Not on file   Years of education: Not on file   Highest education level: Not on file  Occupational History   Not on file  Tobacco Use   Smoking status: Former    Packs/day: 0.33    Years: 14.00    Pack years: 4.62    Types: Cigarettes   Smokeless tobacco: Never   Tobacco comments:    "quit smoking cigarettes in 1992"  Substance and Sexual Activity   Alcohol use: No    Comment: 02/19/2015 "stopped drinking 01/22/2015"   Drug use: No   Sexual activity: Not Currently  Other Topics Concern   Not on file  Social History Narrative   Lives   Caffeine use: Coffee sometimes   Social Determinants of Health   Financial Resource Strain: Not on file  Food Insecurity: Not on file  Transportation Needs: Not on file  Physical Activity: Not on file  Stress: Not on file  Social Connections: Not on file    Tobacco Counseling Counseling given: No Tobacco comments: "quit smoking cigarettes in 1992"   Clinical Intake:  Pre-visit preparation completed: No  Pain : No/denies pain  BMI - recorded: 27.31 Nutritional Status: BMI 25 -29 Overweight Nutritional Risks: None Diabetes: Yes CBG done?: No Did pt. bring in CBG monitor from home?: No  How often do you need to have someone help you when you read instructions, pamphlets, or other written materials from your doctor or pharmacy?: 2 - Rarely  Diabetic? Yes  Interpreter Needed?: No  Information entered by :: [place]   Activities of Daily Living In your present state of health, do you have any difficulty performing the following activities: 06/04/2021  Hearing? N  Vision? N  Difficulty concentrating or making decisions? N  Walking or climbing stairs? N  Dressing or bathing? N   Doing errands, shopping? N  Preparing Food and eating ? N  Using the Toilet? N  In the past six months, have you accidently leaked urine? N  Do you have problems with loss of bowel control? N  Managing your Medications? N  Managing your Finances? N  Housekeeping or managing your Housekeeping? N  Some recent data might be hidden    Patient Care Team: Ladell Pier, MD as PCP - General (Internal Medicine)  Indicate any recent Medical Services you may have received from other than [facility] providers in the past year (date may be approximate).     Assessment:   This is a routine wellness examination for Timothy Lambert.  Hearing/Vision screen No results found.  Dietary issues and exercise activities discussed: Current Exercise Habits:  The patient does not participate in regular exercise at present (Patient is active with housework but does not participate in regular exercise)   Goals Addressed   None   Depression Screen PHQ 2/9 Scores 06/04/2021 02/11/2021 09/09/2020 05/28/2020 04/19/2020 03/13/2019 08/08/2018  PHQ - 2 Score 0 0 0 0 0 0 0  PHQ- 9 Score - - - - - - -    Fall Risk Fall Risk  06/04/2021 02/11/2021 09/09/2020 08/29/2020 05/28/2020  Falls in the past year? 0 0 0 0 0  Number falls in past yr: 0 0 0 0 0  Injury with Fall? 0 0 0 0 0  Risk for fall due to : No Fall Risks No Fall Risks - - -  Follow up Falls evaluation completed;Education provided;Falls prevention discussed - - - Falls evaluation completed;Falls prevention discussed;Education provided    FALL RISK PREVENTION PERTAINING TO THE HOME:  Any stairs in or around the home? No  If so, are there any without handrails? No  Home free of loose throw rugs in walkways, pet beds, electrical cords, etc? Yes  Adequate lighting in your home to reduce risk of falls? Yes   ASSISTIVE DEVICES UTILIZED TO PREVENT FALLS:  Life alert? No  Use of a cane, walker or w/c? No  Grab bars in the bathroom? No  Shower chair or bench in  shower? No  Elevated toilet seat or a handicapped toilet? No   TIMED UP AND GO:  Was the test performed? Yes .  Length of time to ambulate 10 feet: <5 sec.   Gait steady and fast without use of assistive device  Cognitive Function: MMSE - Mini Mental State Exam 06/04/2021 05/28/2020 08/27/2015  Orientation to time '5 5 5  '$ Orientation to Place '5 4 5  '$ Registration '3 3 3  '$ Attention/ Calculation '4 5 5  '$ Recall '3 3 3  '$ Language- name 2 objects '2 2 2  '$ Language- repeat '1 1 1  '$ Language- follow 3 step command '3 3 3  '$ Language- read & follow direction '1 1 1  '$ Write a sentence '1 1 1  '$ Copy design 1 1 0  Total score '29 29 29        '$ Immunizations Immunization History  Administered Date(s) Administered   Moderna Sars-Covid-2 Vaccination 04/22/2020, 05/20/2020   Pneumococcal Polysaccharide-23 01/24/2015    TDAP status: Up to date  Flu Vaccine status: Due, Education has been provided regarding the importance of this vaccine. Advised may receive this vaccine at local pharmacy or Health Dept. Aware to provide a copy of the vaccination record if obtained from local pharmacy or Health Dept. Verbalized acceptance and understanding.  **Patient received Covid booster yesterday (06/03/2021). Wishes to wait until he sees Dr. Wynetta Emery next month before getting any other vaccines.   Pneumococcal vaccine status: Due, Education has been provided regarding the importance of this vaccine. Advised may receive this vaccine at local pharmacy or Health Dept. Aware to provide a copy of the vaccination record if obtained from local pharmacy or Health Dept. Verbalized acceptance and understanding.  Covid-19 vaccine status: Completed vaccines - Advised pt to bring vaccination card to upcoming PCP appointment.   Qualifies for Shingles Vaccine? Yes   Zostavax completed No   Shingrix Completed?: No.    Education has been provided regarding the importance of this vaccine. Patient has been advised to call insurance company  to determine out of pocket expense if they have not yet received this vaccine. Advised may also receive vaccine at local  pharmacy or Health Dept. Verbalized acceptance and understanding.  Screening Tests Health Maintenance  Topic Date Due   Zoster Vaccines- Shingrix (1 of 2) Never done   Pneumococcal Vaccine 53-18 Years old (2 - PCV) 01/24/2016   COVID-19 Vaccine (3 - Moderna risk series) 06/17/2020   OPHTHALMOLOGY EXAM  11/26/2020   INFLUENZA VACCINE  Never done   COLONOSCOPY (Pts 45-52yr Insurance coverage will need to be confirmed)  07/07/2022 (Originally 11/26/2001)   FOOT EXAM  06/28/2021   HEMOGLOBIN A1C  08/14/2021   TETANUS/TDAP  05/22/2025   Hepatitis C Screening  Completed   HIV Screening  Completed   HPV VACCINES  Aged Out    Health Maintenance  Health Maintenance Due  Topic Date Due   Zoster Vaccines- Shingrix (1 of 2) Never done   Pneumococcal Vaccine 060631Years old (2 - PCV) 01/24/2016   COVID-19 Vaccine (3 - Moderna risk series) 06/17/2020   OPHTHALMOLOGY EXAM  11/26/2020   INFLUENZA VACCINE  Never done    Colorectal Cancer screening: colonoscopy declined.   Lung Cancer Screening: (Low Dose CT Chest recommended if Age 64-80years, 30 pack-year currently smoking OR have quit w/in 15years.) does not qualify.   Lung Cancer Screening Referral: None needed.   Additional Screening:  Hepatitis C Screening: does qualify; Completed 08/30/2020.   Vision Screening: Recommended annual ophthalmology exams for early detection of glaucoma and other disorders of the eye. Is the patient up to date with their annual eye exam?  No  - has appointment coming up in the next month per pt.  Who is the provider or what is the name of the office in which the patient attends annual eye exams? Dr. RCelesta Aver  Dental Screening: Recommended annual dental exams for proper oral hygiene  Community Resource Referral / Chronic Care Management: CRR required this visit?  No   CCM required  this visit?  No      Plan:     I have personally reviewed and noted the following in the patient's chart:   Medical and social history Use of alcohol, tobacco or illicit drugs  Current medications and supplements including opioid prescriptions. Patient is not currently taking opioid prescriptions. Functional ability and status Nutritional status Physical activity Advanced directives List of other physicians Hospitalizations, surgeries, and ER visits in previous 12 months Vitals Screenings to include cognitive, depression, and falls Referrals and appointments  In addition, I have reviewed and discussed with patient certain preventive protocols, quality metrics, and best practice recommendations. A written personalized care plan for preventive services as well as general preventive health recommendations were provided to patient.   STresa Endo RPH-CPP   06/04/2021

## 2021-06-09 ENCOUNTER — Other Ambulatory Visit: Payer: Self-pay

## 2021-06-09 MED FILL — Lisinopril Tab 40 MG: ORAL | 90 days supply | Qty: 90 | Fill #1 | Status: AC

## 2021-06-20 ENCOUNTER — Other Ambulatory Visit: Payer: Self-pay

## 2021-07-14 ENCOUNTER — Ambulatory Visit: Payer: Medicare Other | Admitting: Internal Medicine

## 2021-07-18 ENCOUNTER — Other Ambulatory Visit: Payer: Self-pay

## 2021-09-05 ENCOUNTER — Other Ambulatory Visit: Payer: Self-pay

## 2021-09-05 ENCOUNTER — Encounter: Payer: Self-pay | Admitting: Internal Medicine

## 2021-09-05 ENCOUNTER — Ambulatory Visit: Payer: Medicare Other | Attending: Internal Medicine | Admitting: Internal Medicine

## 2021-09-05 VITALS — BP 198/96 | HR 82 | Resp 16 | Wt 174.4 lb

## 2021-09-05 DIAGNOSIS — E11319 Type 2 diabetes mellitus with unspecified diabetic retinopathy without macular edema: Secondary | ICD-10-CM | POA: Diagnosis not present

## 2021-09-05 DIAGNOSIS — E782 Mixed hyperlipidemia: Secondary | ICD-10-CM

## 2021-09-05 DIAGNOSIS — Z2821 Immunization not carried out because of patient refusal: Secondary | ICD-10-CM

## 2021-09-05 DIAGNOSIS — E1122 Type 2 diabetes mellitus with diabetic chronic kidney disease: Secondary | ICD-10-CM

## 2021-09-05 DIAGNOSIS — L602 Onychogryphosis: Secondary | ICD-10-CM

## 2021-09-05 DIAGNOSIS — N1832 Chronic kidney disease, stage 3b: Secondary | ICD-10-CM

## 2021-09-05 DIAGNOSIS — I2510 Atherosclerotic heart disease of native coronary artery without angina pectoris: Secondary | ICD-10-CM

## 2021-09-05 DIAGNOSIS — I10 Essential (primary) hypertension: Secondary | ICD-10-CM | POA: Diagnosis not present

## 2021-09-05 DIAGNOSIS — Z794 Long term (current) use of insulin: Secondary | ICD-10-CM

## 2021-09-05 DIAGNOSIS — Z125 Encounter for screening for malignant neoplasm of prostate: Secondary | ICD-10-CM

## 2021-09-05 LAB — POCT GLYCOSYLATED HEMOGLOBIN (HGB A1C): HbA1c, POC (controlled diabetic range): 7.1 % — AB (ref 0.0–7.0)

## 2021-09-05 LAB — GLUCOSE, POCT (MANUAL RESULT ENTRY): POC Glucose: 155 mg/dl — AB (ref 70–99)

## 2021-09-05 MED ORDER — FUROSEMIDE 20 MG PO TABS
ORAL_TABLET | Freq: Every day | ORAL | 1 refills | Status: DC
  Filled 2021-09-05 – 2021-12-29 (×2): qty 90, 90d supply, fill #0

## 2021-09-05 MED ORDER — LISINOPRIL 40 MG PO TABS
ORAL_TABLET | Freq: Every day | ORAL | 3 refills | Status: DC
  Filled 2021-09-05 – 2021-12-29 (×2): qty 90, 90d supply, fill #0

## 2021-09-05 MED ORDER — AMLODIPINE BESYLATE 10 MG PO TABS
ORAL_TABLET | Freq: Every day | ORAL | 3 refills | Status: DC
  Filled 2021-12-29: qty 90, 90d supply, fill #0

## 2021-09-05 MED ORDER — ATORVASTATIN CALCIUM 40 MG PO TABS
40.0000 mg | ORAL_TABLET | Freq: Every day | ORAL | 1 refills | Status: DC
Start: 2021-09-05 — End: 2022-01-22
  Filled 2021-09-05 – 2021-12-29 (×2): qty 90, 90d supply, fill #0

## 2021-09-05 MED FILL — Amlodipine Besylate Tab 10 MG (Base Equivalent): ORAL | 90 days supply | Qty: 90 | Fill #2 | Status: AC

## 2021-09-05 NOTE — Progress Notes (Signed)
Patient ID: Timothy Lambert, male    DOB: 25-Mar-1956  MRN: [id number]  CC: Diabetes and Hypertension   Subjective: Timothy Lambert is a 65 y.o. male who presents for chronic ds management.  Last seen 01/2021 His concerns today include:  Pt with hx of DM with retinopathy and nephropathy, HTN, HL, CAD with DES LAD 4332, embolic CVA, PAF (thought to be a poor candidate for anticoagulation because of history of EtOH abuse at the time of his CVA in 2016 ) ETOH withdrawal sz, CKD stage 3, macroalbuminuria.    HM: decline flu, pneumonia and shingles vaccines.  He is overdue for his diabetic eye exam.  Agreeable for PSA to be checked for prostate cancer screening.  DIABETES TYPE 2 Last A1C:   Results for orders placed or performed in visit on 09/05/21  POCT glucose (manual entry)  Result Value Ref Range   POC Glucose 155 (A) 70 - 99 mg/dl  POCT glycosylated hemoglobin (Hb A1C)  Result Value Ref Range   Hemoglobin A1C     HbA1c POC (<> result, manual entry)     HbA1c, POC (prediabetic range)     HbA1c, POC (controlled diabetic range) 7.1 (A) 0.0 - 7.0 %    Med Adherence:  [x]  Yes -Glargine insulin 28 units    []  No Medication side effects:  []  Yes    [x]  No Home Monitoring?  []  Yes    [x]  No Home glucose results range: Diet Adherence: [x]  Yes -eating more fruits   []  No Exercise: []  Yes    [x]  No -no longer has to mow his yard.  Has exercise bike at home which he plans to start using Hypoglycemic episodes?: []  Yes    [x]  No Numbness of the feet? []  Yes    [x]  No Retinopathy hx? [x]  Yes    []  No Last eye exam: over due.  Plans to schedule himself.  His ophthalmologist is in [place].  Wife has to take him to the appointments as he is unable to drive after his visits due to the drops that are placed in his eyes. Comments:   HYPERTENSION/CAD/HL Currently taking: see medication list.  He reports compliance with carvedilol, hydralazine, isosorbide, amlodipine, furosemide, lisinopril,  atorvastatin and aspirin. Med Adherence: [x]  Yes  -but did not take any of his blood pressure medications as yet for the morning Medication side effects: []  Yes    [x]  No Adherence with salt restriction: [x]  Yes but ate some salted pork meat yesterday; did try to wash salt off   []  No Home Monitoring?: [x]  Yes    []  No Monitoring Frequency:  once a wk.  Forgot to bring log with him Home BP results range: does not recall his range SOB? []  Yes    [x]  No Chest Pain?: []  Yes    [x]  No Leg swelling?: []  Yes    [x]  No Headaches?: []  Yes    [x]  No Dizziness? []  Yes    [x]  No Comments:   CKD 3:  has appt 10/05/21 with nephrology.  Still making good urine.  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 100 tablet 1   carvedilol (COREG) 25 MG tablet TAKE 1.5 TABLETS (37.5 MG TOTAL) BY MOUTH 2 (TWO) TIMES DAILY WITH A MEAL. 90 tablet 5   glucose blood (ACCU-CHEK GUIDE) test strip Use as instructed 3 times daily DX code E11.9 100 each 3   hydrALAZINE (APRESOLINE) 50 MG tablet TAKE 1.5 TABLETS (75 MG TOTAL) BY MOUTH 2 (TWO) TIMES DAILY. 270 tablet 2   insulin glargine (LANTUS SOLOSTAR) 100 UNIT/ML Solostar Pen  Inject 32 Units into the skin daily. 15 mL 2   Insulin Pen Needle (TECHLITE PEN NEEDLES) 32G X 4 MM MISC Use as directed for insulin 100 each 5   Insulin Syringe-Needle U-100 (INSULIN SYRINGE .5CC/30GX1/2") 30G X 1/2" 0.5 ML MISC Check blood sugar TID & QHS 100 each 2   isosorbide dinitrate (ISORDIL) 20 MG tablet TAKE 2 TABLETS (40 MG TOTAL) BY MOUTH 2 (TWO) TIMES DAILY. 360 tablet 2   Lancets (ACCU-CHEK SOFT TOUCH) lancets Use as instructed 100 each 12   Lancets (ACCU-CHEK SOFT TOUCH) lancets Use as instructed to check blood sugar 3 times daily Dx code E11.9 100 each 3   TRUEPLUS LANCETS 28G MISC 1 each by Does not apply route 3 (three) times daily before meals. 100 each 12   No current facility-administered medications on file prior to visit.    Allergies  Allergen Reactions   Trulicity [Dulaglutide]     Upset his stomach    Social History   Socioeconomic History   Marital status: Married    Spouse name: Not on file   Number of children: Not on file   Years of education: Not on file   Highest education level: Not on file  Occupational History   Not on file  Tobacco Use   Smoking status: Former    Packs/day: 0.33    Years: 14.00    Pack years: 4.62    Types: Cigarettes   Smokeless tobacco: Never   Tobacco comments:    "quit smoking cigarettes in 1992"  Substance and Sexual Activity   Alcohol use: No    Comment: 02/19/2015 "stopped drinking 01/22/2015"   Drug use: No   Sexual activity: Not Currently  Other Topics Concern   Not on file  Social History Narrative   Lives   Caffeine use: Coffee sometimes   Social Determinants of Health   Financial Resource Strain: Not on file  Food Insecurity: Not on file  Transportation Needs: Not on file  Physical Activity: Not on file  Stress: Not on file  Social Connections: Not on file  Intimate Partner Violence: Not on file    Family History  Problem Relation Age of Onset   Diabetes Father    Seizures Neg Hx     Past  Surgical History:  Procedure Laterality Date   CARDIAC CATHETERIZATION N/A 01/23/2015   Procedure: Left Heart Cath and Coronary Angiography;  Surgeon: [name], MD;  Location: [facility] INVASIVE CV LAB CUPID;  Service: Cardiovascular;  Laterality: N/A;   CARDIAC CATHETERIZATION N/A 01/23/2015   Procedure: Coronary Stent  Intervention;  Surgeon: [name], MD;  Location: [facility] INVASIVE CV LAB CUPID;  Service: Cardiovascular;  Laterality: N/A;    ROS: Review of Systems Negative except as stated above  PHYSICAL EXAM: BP (!) 198/96    Pulse 82    Resp 16    Wt 174 lb 6.4 oz (79.1 kg)    SpO2 99%    BMI 27.31 kg/m   Repeat BP 210/95 Physical Exam   General appearance - alert, well appearing, older African-American male delete male and in no distress Mental status - normal mood, behavior, speech, dress, motor activity, and thought processes Neck - supple, no significant adenopathy Chest - clear to auscultation, no wheezes, rales or rhonchi, symmetric air entry Heart - normal rate, regular rhythm, normal S1, S2, no murmurs, rubs, clicks or gallops Extremities - peripheral pulses normal, no pedal edema, no clubbing or cyanosis Diabetic Foot Exam - Simple   Simple Foot Form Visual Inspection See comments: Yes Sensation Testing Intact to touch and monofilament testing bilaterally: Yes Pulse Check Posterior Tibialis and Dorsalis pulse intact bilaterally: Yes Comments Toenails are thick and overgrown     CMP Latest Ref Rng & Units 05/17/2020 11/17/2019 03/13/2019  Glucose 70 - 99 mg/dL 93 197(H) 242(H)  BUN 8 - 23 mg/dL 42(H) 38(H) 33(H)  Creatinine 0.61 - 1.24 mg/dL 2.39(H) 2.30(H) 2.05(H)  Sodium 135 - 145 mmol/L 140 140 139  Potassium 3.5 - 5.1 mmol/L 4.3 4.7 4.7  Chloride 98 - 111 mmol/L 107 105 104  CO2 22 - 32 mmol/L 23 22 21   Calcium 8.9 - 10.3 mg/dL 9.2 8.7 9.6  Total Protein 6.5 - 8.1 g/dL 7.6 - 6.6  Total Bilirubin 0.3 - 1.2 mg/dL 1.0 - 0.4  Alkaline Phos 38 - 126 U/L 52 -  86  AST 15 - 41 U/L 26 - 17  ALT 0 - 44 U/L 22 - 22   Lipid Panel     Component Value Date/Time   CHOL 174 03/13/2019 1639   TRIG 88 03/13/2019 1639   HDL 38 (L) 03/13/2019 1639   CHOLHDL 4.6 03/13/2019 1639   CHOLHDL 3.0 04/25/2015 1216   VLDL 17 04/25/2015 1216   LDLCALC 118 (H) 03/13/2019 1639    CBC    Component Value Date/Time   WBC 9.5 05/17/2020 1746   RBC 4.59 05/17/2020 1746   HGB 13.8 05/17/2020 1746   HGB 13.8 03/13/2019 1639   HCT 38.3 (L) 05/17/2020 1746   HCT 40.1 03/13/2019 1639   PLT 193 05/17/2020 1746   PLT 194 03/13/2019 1639   MCV 83.4 05/17/2020 1746   MCV 89 03/13/2019 1639   MCH 30.1 05/17/2020 1746   MCHC 36.0 05/17/2020 1746   RDW 13.3 05/17/2020 1746   RDW 13.5 03/13/2019 1639   LYMPHSABS 1.7 02/19/2015 2333   MONOABS 0.5 02/19/2015 2333   EOSABS 0.6 02/19/2015 2333   BASOSABS 0.0 02/19/2015 2333    ASSESSMENT AND PLAN: 1. Type 2 diabetes mellitus with stage 3b chronic kidney disease, with long-term current use of insulin (HCC) Close to goal.  Continue current dose of glargine.  Encouraged him to start exercising using his stationary bike at home 3-5 times a week for 15 to 30 minutes.  Encourage healthy eating habits.  Keep upcoming appointment with nephrologist. - POCT glucose (manual entry) - POCT glycosylated hemoglobin (Hb A1C) - CBC - Comprehensive metabolic panel - Lipid panel - Microalbumin / creatinine urine ratio  2. Diabetic retinopathy associated with type 2 diabetes  mellitus, macular edema presence unspecified, unspecified laterality, unspecified retinopathy severity ([place]) Strongly encouraged him to call and make his appointment to have his eyes checked given that he has known retinopathy.  3. Essential hypertension Significantly elevated today.  He has not taken any of his medicines as yet for today.  Encouraged him to take them as soon as he returns home and every day.  Check blood pressure at least twice a week for the next  2 weeks.  Follow-up with clinical pharmacist in 2 weeks for repeat blood pressure check.  Bring blood pressure log with him. - amLODipine (NORVASC) 10 MG tablet; TAKE 1 TABLET (10 MG TOTAL) BY MOUTH DAILY.  Dispense: 90 tablet; Refill: 3 - lisinopril (ZESTRIL) 40 MG tablet; TAKE 1 TABLET (40 MG TOTAL) BY MOUTH DAILY.  Dispense: 90 tablet; Refill: 3  4. Coronary artery disease involving native coronary artery of native heart without angina pectoris Stable.  Continue carvedilol, Lipitor and aspirin - atorvastatin (LIPITOR) 40 MG tablet; Take 1 tablet (40 mg total) by mouth daily at 6 pm.  Dispense: 90 tablet; Refill: 1  5. Overgrown toenails - Ambulatory referral to Podiatry  6. Mixed hyperlipidemia Continue atorvastatin  7. Prostate cancer screening - PSA  8. Influenza vaccination declined Recommended.  Patient declined.  9. Pneumococcal vaccination declined   Patient was given the opportunity to ask questions.  Patient verbalized understanding of the plan and was able to repeat key elements of the plan.   Orders Placed This Encounter  Procedures   CBC   Comprehensive metabolic panel   Lipid panel   Microalbumin / creatinine urine ratio   PSA   Ambulatory referral to Podiatry   POCT glucose (manual entry)   POCT glycosylated hemoglobin (Hb A1C)     Requested Prescriptions   Signed Prescriptions Disp Refills   atorvastatin (LIPITOR) 40 MG tablet 90 tablet 1    Sig: Take 1 tablet (40 mg total) by mouth daily at 6 pm.   amLODipine (NORVASC) 10 MG tablet 90 tablet 3    Sig: TAKE 1 TABLET (10 MG TOTAL) BY MOUTH DAILY.   furosemide (LASIX) 20 MG tablet 90 tablet 1    Sig: TAKE 1 TABLET (20 MG TOTAL) BY MOUTH DAILY.   lisinopril (ZESTRIL) 40 MG tablet 90 tablet 3    Sig: TAKE 1 TABLET (40 MG TOTAL) BY MOUTH DAILY.    Return in about 4 months (around 01/04/2022) for Appt with [facility] in 2 wks for BP check.  Karle Plumber, MD, FACP

## 2021-09-05 NOTE — Patient Instructions (Signed)
Your blood pressure is quite elevated today.  Please take your medications as soon as you return home.  We will have you follow-up in 2 weeks with our clinical pharmacist for repeat blood pressure check.  Please remember to bring your blood pressure log book with you.  Please remember to schedule an eye appointment with your ophthalmologist as you are overdue for your diabetic eye exams.

## 2021-09-06 ENCOUNTER — Other Ambulatory Visit: Payer: Self-pay | Admitting: Internal Medicine

## 2021-09-06 DIAGNOSIS — E875 Hyperkalemia: Secondary | ICD-10-CM

## 2021-09-06 LAB — COMPREHENSIVE METABOLIC PANEL
ALT: 18 IU/L (ref 0–44)
AST: 14 IU/L (ref 0–40)
Albumin/Globulin Ratio: 1.9 (ref 1.2–2.2)
Albumin: 5 g/dL — ABNORMAL HIGH (ref 3.8–4.8)
Alkaline Phosphatase: 89 IU/L (ref 44–121)
BUN/Creatinine Ratio: 16 (ref 10–24)
BUN: 44 mg/dL — ABNORMAL HIGH (ref 8–27)
Bilirubin Total: 0.3 mg/dL (ref 0.0–1.2)
CO2: 18 mmol/L — ABNORMAL LOW (ref 20–29)
Calcium: 9.7 mg/dL (ref 8.6–10.2)
Chloride: 105 mmol/L (ref 96–106)
Creatinine, Ser: 2.77 mg/dL — ABNORMAL HIGH (ref 0.76–1.27)
Globulin, Total: 2.6 g/dL (ref 1.5–4.5)
Glucose: 160 mg/dL — ABNORMAL HIGH (ref 70–99)
Potassium: 5.4 mmol/L — ABNORMAL HIGH (ref 3.5–5.2)
Sodium: 139 mmol/L (ref 134–144)
Total Protein: 7.6 g/dL (ref 6.0–8.5)
eGFR: 25 mL/min/{1.73_m2} — ABNORMAL LOW (ref >59)

## 2021-09-06 LAB — MICROALBUMIN / CREATININE URINE RATIO
Creatinine, Urine: 79.9 mg/dL
Microalb/Creat Ratio: 878 mg/g creat — ABNORMAL HIGH (ref 0–29)
Microalbumin, Urine: 701.8 ug/mL

## 2021-09-06 LAB — CBC
Hematocrit: 39 % (ref 37.5–51.0)
Hemoglobin: 12.9 g/dL — ABNORMAL LOW (ref 13.0–17.7)
MCH: 29.4 pg (ref 26.6–33.0)
MCHC: 33.1 g/dL (ref 31.5–35.7)
MCV: 89 fL (ref 79–97)
Platelets: 184 10*3/uL (ref 150–450)
RBC: 4.39 x10E6/uL (ref 4.14–5.80)
RDW: 13.2 % (ref 11.6–15.4)
WBC: 8.1 10*3/uL (ref 3.4–10.8)

## 2021-09-06 LAB — LIPID PANEL
Chol/HDL Ratio: 3.7 ratio (ref 0.0–5.0)
Cholesterol, Total: 122 mg/dL (ref 100–199)
HDL: 33 mg/dL — ABNORMAL LOW (ref >39)
LDL Chol Calc (NIH): 68 mg/dL (ref 0–99)
Triglycerides: 112 mg/dL (ref 0–149)
VLDL Cholesterol Cal: 21 mg/dL (ref 5–40)

## 2021-09-06 LAB — PSA: Prostate Specific Ag, Serum: 1.6 ng/mL (ref 0.0–4.0)

## 2021-09-08 ENCOUNTER — Telehealth: Payer: Self-pay

## 2021-09-08 NOTE — Telephone Encounter (Signed)
Contacted pt to go over lab results pt is aware and doesn't have any questions or concerns 

## 2021-09-12 ENCOUNTER — Other Ambulatory Visit: Payer: Self-pay

## 2021-12-11 ENCOUNTER — Telehealth: Payer: Self-pay | Admitting: Pharmacist

## 2021-12-11 NOTE — Patient Outreach (Signed)
Patient appearing on report for [place] Metric Hypertension Control due to last documented ambulatory blood pressure of 198/96 on 09/05/2021. Next appointment with PCP is 01/15/22  ? ?Outreached patient to discuss hypertension control and medication management. Left voicemail for him to return my call at his convenience.  ? ?Catie Darnelle Maffucci, PharmD, BCACP ?[place] ?[phone] ? ?

## 2021-12-23 ENCOUNTER — Telehealth: Payer: Self-pay | Admitting: Pharmacist

## 2021-12-23 NOTE — Patient Outreach (Signed)
Patient appearing on report for [place] Metric Hypertension Control due to last documented ambulatory blood pressure of 198/96 on 09/05/2021. Next appointment with PCP is 01/15/22  ?  ?Outreached patient to discuss hypertension control and medication management. Left voicemail for him to return my call at his convenience.  ? ?Catie Hedwig Morton, PharmD, BCACP ?[place] ?[phone] ? ?

## 2021-12-29 ENCOUNTER — Other Ambulatory Visit: Payer: Self-pay

## 2021-12-29 ENCOUNTER — Telehealth: Payer: Self-pay | Admitting: *Deleted

## 2021-12-29 NOTE — Telephone Encounter (Addendum)
RN made yearly wellness check and called to say pt's A1C over 13 and his BP is 180/106. Pt not reporting any symptoms, already has appt scheduled for 01/15/22 which is sooner than any appt I could find. [place] assess. ? ?Able to get pt in with PCE tomorrow, 12/30/21. ?

## 2021-12-30 ENCOUNTER — Ambulatory Visit (INDEPENDENT_AMBULATORY_CARE_PROVIDER_SITE_OTHER): Payer: Medicare Other | Admitting: Physician Assistant

## 2021-12-30 ENCOUNTER — Encounter: Payer: Self-pay | Admitting: Physician Assistant

## 2021-12-30 DIAGNOSIS — I25119 Atherosclerotic heart disease of native coronary artery with unspecified angina pectoris: Secondary | ICD-10-CM

## 2021-12-30 DIAGNOSIS — I480 Paroxysmal atrial fibrillation: Secondary | ICD-10-CM | POA: Diagnosis not present

## 2021-12-30 DIAGNOSIS — E11319 Type 2 diabetes mellitus with unspecified diabetic retinopathy without macular edema: Secondary | ICD-10-CM | POA: Diagnosis not present

## 2021-12-30 DIAGNOSIS — E1122 Type 2 diabetes mellitus with diabetic chronic kidney disease: Secondary | ICD-10-CM | POA: Diagnosis not present

## 2021-12-30 DIAGNOSIS — I10 Essential (primary) hypertension: Secondary | ICD-10-CM

## 2021-12-30 DIAGNOSIS — Z794 Long term (current) use of insulin: Secondary | ICD-10-CM

## 2021-12-30 DIAGNOSIS — E875 Hyperkalemia: Secondary | ICD-10-CM | POA: Diagnosis not present

## 2021-12-30 DIAGNOSIS — N1832 Chronic kidney disease, stage 3b: Secondary | ICD-10-CM

## 2021-12-30 NOTE — Progress Notes (Signed)
? ?Established Patient Office Visit ? ?Subjective:  ?Patient ID: Timothy Lambert, male    DOB: 1956/01/10  Age: 66 y.o. MRN: [id number] ? ?CC:  ?Chief Complaint  ?Patient presents with  ? Hypertension  ? Diabetes  ? ? ?HPI ?Timothy Lambert states that he was seen by an RN yesterday for a yearly wellness check and his A1c was over 13 and his blood pressure was 180/106. ? ?States today that he has been using the Lantus 28 units during the day.  Does also endorse that he has not used any medication for the last "2 to 3 weeks".  States that he did restart it yesterday. ? ?Denies difficulty with access to medication or appointments, does not give reason for stopping medication. ? ?Wife is present and states that it seems like he had not been taking the medication longer than he says. ? ?Does not check his blood glucose or blood pressure at home on his own. ? ?States that he has not made any changes to his diet since his last office visit, states that he only drinks diet drinks, but does eat 1 little Debbie raisin cake a day. ? ?Past Medical History:  ?Diagnosis Date  ? Acute renal failure (ARF) ([place]) 02/19/2015  ? CHF (congestive heart failure) ([place])   ? Coronary artery disease   ? GERD (gastroesophageal reflux disease)   ? HLD (hyperlipidemia)   ? Hypertension   ? Myocardial infarction [facility]) 01/22/2015  ? "massive"  ? Refusal of blood transfusions as patient is Jehovah's Witness   ? Seizures ([place]) 01/22/2015  ? "in front of paramedics"  ? Stroke [facility]) 01/22/2015  ? "short term memory loss & right side weak since" (02/19/2015)  ? Type II diabetes mellitus ([place])   ? ? ?Past Surgical History:  ?Procedure Laterality Date  ? CARDIAC CATHETERIZATION N/A 01/23/2015  ? Procedure: Left Heart Cath and Coronary Angiography;  Surgeon: [name], MD;  Location: [facility] INVASIVE CV LAB CUPID;  Service: Cardiovascular;  Laterality: N/A;  ? CARDIAC CATHETERIZATION N/A 01/23/2015  ? Procedure: Coronary Stent Intervention;  Surgeon: [name], MD;  Location[facility] INVASIVE CV LAB CUPID;  Service: Cardiovascular;  Laterality: N/A;  ? ? ?Family History  ?Problem Relation Age of Onset  ? Diabetes Father   ? Seizures Neg Hx   ? ? ?Social History  ? ?Socioeconomic History  ? Marital status: Married  ?  Spouse name: Not on file  ? Number of children: Not on file  ? Years of education: Not on file  ? Highest education level: Not on file  ?Occupational History  ? Not on file  ?Tobacco Use  ? Smoking status: Former  ?  Packs/day: 0.33  ?  Years: 14.00  ?  Pack years: 4.62  ?  Types: Cigarettes  ? Smokeless tobacco: Never  ? Tobacco comments:  ?  "quit smoking cigarettes in 1992"  ?Substance and Sexual Activity  ? Alcohol use: No  ?  Comment: 02/19/2015 "stopped drinking 01/22/2015"  ? Drug use: No  ? Sexual activity: Not Currently  ?Other Topics Concern  ? Not on file  ?Social History Narrative  ? Lives  ? Caffeine use: Coffee sometimes  ? ?Social Determinants of Health  ? ?Financial Resource Strain: Not on file  ?Food Insecurity: Not on file  ?Transportation Needs: Not on file  ?Physical Activity: Not on file  ?Stress: Not on file  ?Social Connections: Not on file  ?Intimate Partner Violence: Not on file  ? ? ?  Outpatient Medications Prior to Visit  ?Medication Sig Dispense Refill  ? amLODipine (NORVASC) 10 MG tablet TAKE 1 TABLET (10 MG TOTAL) BY MOUTH DAILY. 90 tablet 3  ? aspirin EC 81 MG tablet Take 1 tablet (81 mg total) by mouth daily. 100 tablet 1  ? atorvastatin (LIPITOR) 40 MG tablet Take 1 tablet (40 mg total) by mouth daily at 6 pm. 90 tablet 1  ? carvedilol (COREG) 25 MG tablet TAKE 1.5 TABLETS (37.5 MG TOTAL) BY MOUTH 2 (TWO) TIMES DAILY WITH A MEAL. 90 tablet 5  ? furosemide (LASIX) 20 MG tablet TAKE 1 TABLET (20 MG TOTAL) BY MOUTH DAILY. 90 tablet 1  ? glucose blood (ACCU-CHEK GUIDE) test strip Use as instructed 3 times daily DX code E11.9 100 each 3  ? hydrALAZINE (APRESOLINE) 50 MG tablet TAKE 1.5 TABLETS (75 MG TOTAL) BY MOUTH 2 (TWO) TIMES  DAILY. 270 tablet 2  ? insulin glargine (LANTUS SOLOSTAR) 100 UNIT/ML Solostar Pen Inject 32 Units into the skin daily. 15 mL 2  ? Insulin Pen Needle (TECHLITE PEN NEEDLES) 32G X 4 MM MISC Use as directed for insulin 100 each 5  ? Insulin Syringe-Needle U-100 (INSULIN SYRINGE .5CC/30GX1/2") 30G X 1/2" 0.5 ML MISC Check blood sugar TID & QHS 100 each 2  ? isosorbide dinitrate (ISORDIL) 20 MG tablet TAKE 2 TABLETS (40 MG TOTAL) BY MOUTH 2 (TWO) TIMES DAILY. 360 tablet 2  ? Lancets (ACCU-CHEK SOFT TOUCH) lancets Use as instructed 100 each 12  ? Lancets (ACCU-CHEK SOFT TOUCH) lancets Use as instructed to check blood sugar 3 times daily Dx code E11.9 100 each 3  ? lisinopril (ZESTRIL) 40 MG tablet TAKE 1 TABLET (40 MG TOTAL) BY MOUTH DAILY. 90 tablet 3  ? TRUEPLUS LANCETS 28G MISC 1 each by Does not apply route 3 (three) times daily before meals. 100 each 12  ? ?No facility-administered medications prior to visit.  ? ? ?Allergies  ?Allergen Reactions  ? Trulicity [Dulaglutide]   ?  Upset his stomach  ? ? ?ROS ?Review of Systems  ?Constitutional: Negative.   ?HENT: Negative.    ?Eyes: Negative.   ?Respiratory:  Negative for shortness of breath.   ?Cardiovascular:  Negative for chest pain.  ?Gastrointestinal: Negative.   ?Endocrine: Negative.   ?Genitourinary: Negative.   ?Musculoskeletal: Negative.   ?Skin: Negative.   ?Allergic/Immunologic: Negative.   ?Neurological: Negative.   ?Hematological: Negative.   ?Psychiatric/Behavioral: Negative.    ? ?  ?Objective:  ?  ?Physical Exam ?Vitals and nursing note reviewed.  ?Constitutional:   ?   General: He is not in acute distress. ?   Appearance: Normal appearance. He is not ill-appearing.  ?HENT:  ?   Head: Normocephalic and atraumatic.  ?   Right Ear: External ear normal.  ?   Left Ear: External ear normal.  ?   Nose: Nose normal.  ?   Mouth/Throat:  ?   Mouth: Mucous membranes are moist.  ?   Pharynx: Oropharynx is clear.  ?Eyes:  ?   Extraocular Movements: Extraocular  movements intact.  ?   Conjunctiva/sclera: Conjunctivae normal.  ?   Pupils: Pupils are equal, round, and reactive to light.  ?Cardiovascular:  ?   Rate and Rhythm: Normal rate and regular rhythm.  ?   Pulses: Normal pulses.  ?   Heart sounds: Normal heart sounds.  ?Pulmonary:  ?   Effort: Pulmonary effort is normal.  ?   Breath sounds: Normal breath sounds.  ?Musculoskeletal:     ?  General: Normal range of motion.  ?   Cervical back: Normal range of motion and neck supple.  ?Skin: ?   General: Skin is warm and dry.  ?Neurological:  ?   General: No focal deficit present.  ?   Mental Status: He is alert and oriented to person, place, and time.  ?Psychiatric:     ?   Mood and Affect: Mood normal.     ?   Behavior: Behavior normal.     ?   Thought Content: Thought content normal.     ?   Judgment: Judgment normal.  ? ? ?BP 106/68 (BP Location: Right Arm, Patient Position: Sitting, Cuff Size: Normal)   Pulse 84   Temp 98.7 ?F (37.1 ?C) (Oral)   Resp 18   Ht _0  (1.702 m)   Wt 154 lb (69.9 kg)   SpO2 98%   BMI 24.12 kg/m?  ?Wt Readings from Last 3 Encounters:  ?12/30/21 154 lb (69.9 kg)  ?09/05/21 174 lb 6.4 oz (79.1 kg)  ?06/04/21 174 lb 6.4 oz (79.1 kg)  ? ? ? ?Health Maintenance Due  ?Topic Date Due  ? Zoster Vaccines- Shingrix (1 of 2) Never done  ? Pneumonia Vaccine 15+ Years old (2 - PCV) 01/24/2016  ? COVID-19 Vaccine (3 - Moderna risk series) 06/17/2020  ? OPHTHALMOLOGY EXAM  11/26/2020  ? ? ?There are no preventive care reminders to display for this patient. ? ?Lab Results  ?Component Value Date  ? TSH 2.990 12/10/2017  ? ?Lab Results  ?Component Value Date  ? WBC 8.1 09/05/2021  ? HGB 12.9 (L) 09/05/2021  ? HCT 39.0 09/05/2021  ? MCV 89 09/05/2021  ? PLT 184 09/05/2021  ? ?Lab Results  ?Component Value Date  ? NA 132 (L) 12/30/2021  ? K 5.6 (H) 12/30/2021  ? CO2 18 (L) 12/30/2021  ? GLUCOSE 527 (HH) 12/30/2021  ? BUN 46 (H) 12/30/2021  ? CREATININE 3.00 (H) 12/30/2021  ? BILITOT 0.3 09/05/2021  ?  ALKPHOS 89 09/05/2021  ? AST 14 09/05/2021  ? ALT 18 09/05/2021  ? PROT 7.6 09/05/2021  ? ALBUMIN 5.0 (H) 09/05/2021  ? CALCIUM 9.3 12/30/2021  ? ANIONGAP 10 05/17/2020  ? EGFR 22 (L) 12/30/2021  ? GFR 92.17 04/02/2015  ? ?L

## 2021-12-30 NOTE — Progress Notes (Signed)
Patient has eaten and taken medication today. ?Patient denies pain at this time. ?Patient reports house call from NP on yesterday who reported patients A1C being 13 and BP 180/106. ? ?

## 2021-12-30 NOTE — Patient Instructions (Signed)
I do encourage you to check your blood glucose levels every morning before eating, keep a written log and have available for your office visit with Dr. Wynetta Emery. ? ?I encourage you to watch the amount of added sugar you are eating as well. ? ?We will call you with today's lab results ? ?Kennieth Rad, PA-C ?Physician Assistant ?[place] ?[email or web address] ? ? ?Carbohydrate Counting for Diabetes Mellitus, Adult ?Carbohydrate counting is a method of keeping track of how many carbohydrates you eat. Eating carbohydrates increases the amount of sugar (glucose) in the blood. Counting how many carbohydrates you eat improves how well you manage your blood glucose. This, in turn, helps you manage your diabetes. ?Carbohydrates are measured in grams (g) per serving. It is important to know how many carbohydrates (in grams or by serving size) you can have in each meal. This is different for every person. A dietitian can help you make a meal plan and calculate how many carbohydrates you should have at each meal and snack. ?What foods contain carbohydrates? ?Carbohydrates are found in the following foods: ?Grains, such as breads and cereals. ?Dried beans and soy products. ?Starchy vegetables, such as potatoes, peas, and corn. ?Fruit and fruit juices. ?Milk and yogurt. ?Sweets and snack foods, such as cake, cookies, candy, chips, and soft drinks. ?How do I count carbohydrates in foods? ?There are two ways to count carbohydrates in food. You can read food labels or learn standard serving sizes of foods. You can use either of these methods or a combination of both. ?Using the Nutrition Facts label ?The Nutrition Facts list is included on the labels of almost all packaged foods and beverages in the [place]. It includes: ?The serving size. ?Information about nutrients in each serving, including the grams of carbohydrate per serving. ?To use the Nutrition Facts, decide  how many servings you will have. Then, multiply the number of servings by the number of carbohydrates per serving. The resulting number is the total grams of carbohydrates that you will be having. ?Learning the standard serving sizes of foods ?When you eat carbohydrate foods that are not packaged or do not include Nutrition Facts on the label, you need to measure the servings in order to count the grams of carbohydrates. ?Measure the foods that you will eat with a food scale or measuring cup, if needed. ?Decide how many standard-size servings you will eat. ?Multiply the number of servings by 15. For foods that contain carbohydrates, one serving equals 15 g of carbohydrates. ?For example, if you eat 2 cups or 10 oz (300 g) of strawberries, you will have eaten 2 servings and 30 g of carbohydrates (2 servings x 15 g = 30 g). ?For foods that have more than one food mixed, such as soups and casseroles, you must count the carbohydrates in each food that is included. ?The following list contains standard serving sizes of common carbohydrate-rich foods. Each of these servings has about 15 g of carbohydrates: ?1 slice of bread. ?1 six-inch (15 cm) tortilla. ?? cup or 2 oz (53 g) cooked rice or pasta. ?? cup or 3 oz (85 g) cooked or canned, drained and rinsed beans or lentils. ?? cup or 3 oz (85 g) starchy vegetable, such as peas, corn, or squash. ?? cup or 4 oz (120 g) hot cereal. ?? cup or 3 oz (85 g) boiled or mashed potatoes, or ? or 3 oz (85 g) of a large baked potato. ?? cup or 4 fl oz (118  mL) fruit juice. ?1 cup or 8 fl oz (237 mL) milk. ?1 small or 4 oz (106 g) apple. ?? or 2 oz (63 g) of a medium banana. ?1 cup or 5 oz (150 g) strawberries. ?3 cups or 1 oz (28.3 g) popped popcorn. ?What is an example of carbohydrate counting? ?To calculate the grams of carbohydrates in this sample meal, follow the steps shown below. ?Sample meal ?3 oz (85 g) chicken breast. ?? cup or 4 oz (106 g) brown rice. ?? cup or 3 oz (85 g)  corn. ?1 cup or 8 fl oz (237 mL) milk. ?1 cup or 5 oz (150 g) strawberries with sugar-free whipped topping. ?Carbohydrate calculation ?Identify the foods that contain carbohydrates: ?Rice. ?Corn. ?Milk. ?Strawberries. ?Calculate how many servings you have of each food: ?2 servings rice. ?1 serving corn. ?1 serving milk. ?1 serving strawberries. ?Multiply each number of servings by 15 g: ?2 servings rice x 15 g = 30 g. ?1 serving corn x 15 g = 15 g. ?1 serving milk x 15 g = 15 g. ?1 serving strawberries x 15 g = 15 g. ?Add together all of the amounts to find the total grams of carbohydrates eaten: ?30 g + 15 g + 15 g + 15 g = 75 g of carbohydrates total. ?What are tips for following this plan? ?Shopping ?Develop a meal plan and then make a shopping list. ?Buy fresh and frozen vegetables, fresh and frozen fruit, dairy, eggs, beans, lentils, and whole grains. ?Look at food labels. Choose foods that have more fiber and less sugar. ?Avoid processed foods and foods with added sugars. ?Meal planning ?Aim to have the same number of grams of carbohydrates at each meal and for each snack time. ?Plan to have regular, balanced meals and snacks. ?Where to find more information ?American Diabetes Association: diabetes.org ?Centers for Disease Control and Prevention: StoreMirror.com.cy ?Academy of Nutrition and Dietetics: eatright.org ?Association of Diabetes Care & Education Specialists: diabeteseducator.org ?Summary ?Carbohydrate counting is a method of keeping track of how many carbohydrates you eat. ?Eating carbohydrates increases the amount of sugar (glucose) in your blood. ?Counting how many carbohydrates you eat improves how well you manage your blood glucose. This helps you manage your diabetes. ?A dietitian can help you make a meal plan and calculate how many carbohydrates you should have at each meal and snack. ?This information is not intended to replace advice given to you by your health care provider. Make sure you discuss any  questions you have with your health care provider. ?Document Revised: 04/10/2020 Document Reviewed: 04/10/2020 ?Elsevier Patient Education ? [place]. ? ?

## 2021-12-31 ENCOUNTER — Telehealth: Payer: Self-pay | Admitting: *Deleted

## 2021-12-31 LAB — BASIC METABOLIC PANEL
BUN/Creatinine Ratio: 15 (ref 10–24)
BUN: 46 mg/dL — ABNORMAL HIGH (ref 8–27)
CO2: 18 mmol/L — ABNORMAL LOW (ref 20–29)
Calcium: 9.3 mg/dL (ref 8.6–10.2)
Chloride: 97 mmol/L (ref 96–106)
Creatinine, Ser: 3 mg/dL — ABNORMAL HIGH (ref 0.76–1.27)
Glucose: 527 mg/dL (ref 70–99)
Potassium: 5.6 mmol/L — ABNORMAL HIGH (ref 3.5–5.2)
Sodium: 132 mmol/L — ABNORMAL LOW (ref 134–144)
eGFR: 22 mL/min/{1.73_m2} — ABNORMAL LOW (ref >59)

## 2021-12-31 LAB — HEMOGLOBIN A1C
Est. average glucose Bld gHb Est-mCnc: 372 mg/dL
Hgb A1c MFr Bld: 14.6 % — ABNORMAL HIGH (ref 4.8–5.6)

## 2021-12-31 LAB — GLUCOSE, POCT (MANUAL RESULT ENTRY): POC Glucose: 119 mg/dl — AB (ref 70–99)

## 2021-12-31 NOTE — Telephone Encounter (Signed)
[place] ? ?Critical Lab- Glucose: 527 ?

## 2021-12-31 NOTE — Addendum Note (Signed)
Addended by: Kennieth Rad on: 12/31/2021 12:55 PM ? ? Modules accepted: Orders ? ?

## 2022-01-02 ENCOUNTER — Telehealth: Payer: Self-pay | Admitting: *Deleted

## 2022-01-02 NOTE — Telephone Encounter (Signed)
-----   Message from Kennieth Rad, [place] sent at 12/31/2021 12:54 PM EDT ----- ?Please call patient and let him know that his blood glucose level that was sent off was 527, it is very important that he stay compliant to his medication and check his blood glucose levels several times throughout the day.  His blood glucose level in the office was 219, please verify his blood glucose since his office visit.  His kidney function is worsening slightly, this should improve with better blood glucose control.  His potassium levels are high, he does need to have this level repeated.  He needs to avoid potassium rich foods.  Please have him return to office next week to have this lab repeated. ?

## 2022-01-02 NOTE — Telephone Encounter (Signed)
Patient verified DOB ?Patient is aware of labs being off and needing to adhere to medications. Patient states he will call back to schedule potassium recheck. MA advised patient to return the call asap. ?

## 2022-01-05 NOTE — Telephone Encounter (Signed)
Left voicemail to return call. 

## 2022-01-06 ENCOUNTER — Ambulatory Visit: Payer: Medicare Other | Admitting: Internal Medicine

## 2022-01-15 ENCOUNTER — Encounter (HOSPITAL_COMMUNITY): Payer: Self-pay | Admitting: Emergency Medicine

## 2022-01-15 ENCOUNTER — Emergency Department (HOSPITAL_COMMUNITY): Payer: Medicare Other

## 2022-01-15 ENCOUNTER — Inpatient Hospital Stay (HOSPITAL_COMMUNITY)
Admission: EM | Admit: 2022-01-15 | Discharge: 2022-01-22 | DRG: 280 | Disposition: A | Discharge status: Home Health | Payer: Medicare Other | Attending: Internal Medicine | Admitting: Internal Medicine

## 2022-01-15 ENCOUNTER — Other Ambulatory Visit: Payer: Self-pay

## 2022-01-15 ENCOUNTER — Ambulatory Visit: Payer: Medicare Other | Admitting: Internal Medicine

## 2022-01-15 DIAGNOSIS — Z955 Presence of coronary angioplasty implant and graft: Secondary | ICD-10-CM

## 2022-01-15 DIAGNOSIS — R0602 Shortness of breath: Secondary | ICD-10-CM

## 2022-01-15 DIAGNOSIS — Z20822 Contact with and (suspected) exposure to covid-19: Secondary | ICD-10-CM | POA: Diagnosis present

## 2022-01-15 DIAGNOSIS — I2510 Atherosclerotic heart disease of native coronary artery without angina pectoris: Secondary | ICD-10-CM | POA: Diagnosis not present

## 2022-01-15 DIAGNOSIS — R570 Cardiogenic shock: Secondary | ICD-10-CM | POA: Diagnosis not present

## 2022-01-15 DIAGNOSIS — I25119 Atherosclerotic heart disease of native coronary artery with unspecified angina pectoris: Secondary | ICD-10-CM | POA: Diagnosis not present

## 2022-01-15 DIAGNOSIS — N179 Acute kidney failure, unspecified: Secondary | ICD-10-CM | POA: Diagnosis not present

## 2022-01-15 DIAGNOSIS — E1169 Type 2 diabetes mellitus with other specified complication: Secondary | ICD-10-CM | POA: Diagnosis not present

## 2022-01-15 DIAGNOSIS — Z66 Do not resuscitate: Secondary | ICD-10-CM | POA: Diagnosis not present

## 2022-01-15 DIAGNOSIS — F1010 Alcohol abuse, uncomplicated: Secondary | ICD-10-CM | POA: Diagnosis present

## 2022-01-15 DIAGNOSIS — I10 Essential (primary) hypertension: Secondary | ICD-10-CM | POA: Diagnosis not present

## 2022-01-15 DIAGNOSIS — N170 Acute kidney failure with tubular necrosis: Secondary | ICD-10-CM | POA: Diagnosis present

## 2022-01-15 DIAGNOSIS — I5023 Acute on chronic systolic (congestive) heart failure: Secondary | ICD-10-CM | POA: Diagnosis not present

## 2022-01-15 DIAGNOSIS — Z833 Family history of diabetes mellitus: Secondary | ICD-10-CM

## 2022-01-15 DIAGNOSIS — E1122 Type 2 diabetes mellitus with diabetic chronic kidney disease: Secondary | ICD-10-CM | POA: Diagnosis not present

## 2022-01-15 DIAGNOSIS — Z7982 Long term (current) use of aspirin: Secondary | ICD-10-CM

## 2022-01-15 DIAGNOSIS — J90 Pleural effusion, not elsewhere classified: Secondary | ICD-10-CM | POA: Diagnosis not present

## 2022-01-15 DIAGNOSIS — N184 Chronic kidney disease, stage 4 (severe): Secondary | ICD-10-CM | POA: Diagnosis present

## 2022-01-15 DIAGNOSIS — E785 Hyperlipidemia, unspecified: Secondary | ICD-10-CM | POA: Diagnosis present

## 2022-01-15 DIAGNOSIS — I130 Hypertensive heart and chronic kidney disease with heart failure and stage 1 through stage 4 chronic kidney disease, or unspecified chronic kidney disease: Principal | ICD-10-CM | POA: Diagnosis present

## 2022-01-15 DIAGNOSIS — I6930 Unspecified sequelae of cerebral infarction: Secondary | ICD-10-CM | POA: Diagnosis not present

## 2022-01-15 DIAGNOSIS — J440 Chronic obstructive pulmonary disease with acute lower respiratory infection: Secondary | ICD-10-CM | POA: Diagnosis not present

## 2022-01-15 DIAGNOSIS — E1110 Type 2 diabetes mellitus with ketoacidosis without coma: Secondary | ICD-10-CM | POA: Diagnosis not present

## 2022-01-15 DIAGNOSIS — J181 Lobar pneumonia, unspecified organism: Secondary | ICD-10-CM | POA: Diagnosis present

## 2022-01-15 DIAGNOSIS — Z515 Encounter for palliative care: Secondary | ICD-10-CM | POA: Diagnosis not present

## 2022-01-15 DIAGNOSIS — K219 Gastro-esophageal reflux disease without esophagitis: Secondary | ICD-10-CM | POA: Diagnosis not present

## 2022-01-15 DIAGNOSIS — E871 Hypo-osmolality and hyponatremia: Secondary | ICD-10-CM | POA: Diagnosis not present

## 2022-01-15 DIAGNOSIS — R0902 Hypoxemia: Secondary | ICD-10-CM | POA: Diagnosis not present

## 2022-01-15 DIAGNOSIS — E875 Hyperkalemia: Secondary | ICD-10-CM | POA: Diagnosis not present

## 2022-01-15 DIAGNOSIS — D631 Anemia in chronic kidney disease: Secondary | ICD-10-CM | POA: Diagnosis not present

## 2022-01-15 DIAGNOSIS — N1832 Chronic kidney disease, stage 3b: Secondary | ICD-10-CM | POA: Diagnosis not present

## 2022-01-15 DIAGNOSIS — I5043 Acute on chronic combined systolic (congestive) and diastolic (congestive) heart failure: Secondary | ICD-10-CM | POA: Diagnosis present

## 2022-01-15 DIAGNOSIS — I5020 Unspecified systolic (congestive) heart failure: Secondary | ICD-10-CM | POA: Diagnosis not present

## 2022-01-15 DIAGNOSIS — E119 Type 2 diabetes mellitus without complications: Secondary | ICD-10-CM

## 2022-01-15 DIAGNOSIS — Z794 Long term (current) use of insulin: Secondary | ICD-10-CM | POA: Diagnosis not present

## 2022-01-15 DIAGNOSIS — Z79899 Other long term (current) drug therapy: Secondary | ICD-10-CM

## 2022-01-15 DIAGNOSIS — J9601 Acute respiratory failure with hypoxia: Secondary | ICD-10-CM | POA: Diagnosis present

## 2022-01-15 DIAGNOSIS — N183 Chronic kidney disease, stage 3 unspecified: Secondary | ICD-10-CM | POA: Diagnosis present

## 2022-01-15 DIAGNOSIS — Z888 Allergy status to other drugs, medicaments and biological substances status: Secondary | ICD-10-CM

## 2022-01-15 DIAGNOSIS — I480 Paroxysmal atrial fibrillation: Secondary | ICD-10-CM | POA: Diagnosis present

## 2022-01-15 DIAGNOSIS — Z7189 Other specified counseling: Secondary | ICD-10-CM | POA: Diagnosis not present

## 2022-01-15 DIAGNOSIS — U071 COVID-19: Principal | ICD-10-CM

## 2022-01-15 DIAGNOSIS — J9811 Atelectasis: Secondary | ICD-10-CM | POA: Diagnosis not present

## 2022-01-15 DIAGNOSIS — J449 Chronic obstructive pulmonary disease, unspecified: Secondary | ICD-10-CM | POA: Diagnosis not present

## 2022-01-15 DIAGNOSIS — N189 Chronic kidney disease, unspecified: Secondary | ICD-10-CM | POA: Diagnosis not present

## 2022-01-15 DIAGNOSIS — Z87891 Personal history of nicotine dependence: Secondary | ICD-10-CM

## 2022-01-15 DIAGNOSIS — I214 Non-ST elevation (NSTEMI) myocardial infarction: Secondary | ICD-10-CM | POA: Diagnosis not present

## 2022-01-15 DIAGNOSIS — D649 Anemia, unspecified: Secondary | ICD-10-CM | POA: Diagnosis not present

## 2022-01-15 DIAGNOSIS — Z91119 Patient's noncompliance with dietary regimen due to unspecified reason: Secondary | ICD-10-CM

## 2022-01-15 DIAGNOSIS — I252 Old myocardial infarction: Secondary | ICD-10-CM

## 2022-01-15 DIAGNOSIS — J811 Chronic pulmonary edema: Secondary | ICD-10-CM | POA: Diagnosis not present

## 2022-01-15 LAB — CBC
HCT: 33.4 % — ABNORMAL LOW (ref 39.0–52.0)
Hemoglobin: 11.2 g/dL — ABNORMAL LOW (ref 13.0–17.0)
MCH: 29.6 pg (ref 26.0–34.0)
MCHC: 33.5 g/dL (ref 30.0–36.0)
MCV: 88.4 fL (ref 80.0–100.0)
Platelets: 185 10*3/uL (ref 150–400)
RBC: 3.78 MIL/uL — ABNORMAL LOW (ref 4.22–5.81)
RDW: 13.4 % (ref 11.5–15.5)
WBC: 13.7 10*3/uL — ABNORMAL HIGH (ref 4.0–10.5)
nRBC: 0 % (ref 0.0–0.2)

## 2022-01-15 LAB — BASIC METABOLIC PANEL
Anion gap: 13 (ref 5–15)
BUN: 61 mg/dL — ABNORMAL HIGH (ref 8–23)
CO2: 17 mmol/L — ABNORMAL LOW (ref 22–32)
Calcium: 8.7 mg/dL — ABNORMAL LOW (ref 8.9–10.3)
Chloride: 104 mmol/L (ref 98–111)
Creatinine, Ser: 3.79 mg/dL — ABNORMAL HIGH (ref 0.61–1.24)
GFR, Estimated: 17 mL/min — ABNORMAL LOW (ref >60)
Glucose, Bld: 422 mg/dL — ABNORMAL HIGH (ref 70–99)
Potassium: 5.4 mmol/L — ABNORMAL HIGH (ref 3.5–5.1)
Sodium: 134 mmol/L — ABNORMAL LOW (ref 135–145)

## 2022-01-15 MED ORDER — IPRATROPIUM-ALBUTEROL 0.5-2.5 (3) MG/3ML IN SOLN
3.0000 mL | Freq: Once | RESPIRATORY_TRACT | Status: AC
  Administered 2022-01-15: 3 mL via RESPIRATORY_TRACT
  Filled 2022-01-15: qty 3

## 2022-01-15 MED ORDER — METHYLPREDNISOLONE SODIUM SUCC 125 MG IJ SOLR
125.0000 mg | Freq: Once | INTRAMUSCULAR | Status: AC
  Administered 2022-01-16: 125 mg via INTRAVENOUS
  Filled 2022-01-15: qty 2

## 2022-01-15 MED ORDER — SODIUM CHLORIDE 0.9 % IV SOLN
1.0000 g | Freq: Once | INTRAVENOUS | Status: AC
  Administered 2022-01-15: 1 g via INTRAVENOUS
  Filled 2022-01-15: qty 10

## 2022-01-15 NOTE — Telephone Encounter (Signed)
Patient verified DOB ?Patient is aware of labs being off and needing to adhere to medications. Patient states he will call back to schedule potassium recheck. MA advised patient to return the call asap. ? ?Created appointment for Monday May 1st at 10:30 ?

## 2022-01-15 NOTE — ED Notes (Signed)
Called RT

## 2022-01-15 NOTE — ED Triage Notes (Signed)
Pt reports feeling SOB and cough at home x 2 days, tested positive for COVID at home; pt RA O2 sats 74-76%; pt placed on 4L [place] O2 94% ?

## 2022-01-15 NOTE — ED Provider Notes (Signed)
?[facility] ?Provider Note ? ? ?CSN: [id number] ?Arrival date & time: 01/15/22  2153 ? ?  ? ?History ? ?Chief Complaint  ?Patient presents with  ? Covid Positive  ? ? ?Timothy Lambert is a 66 y.o. male. ? ?HPI ? ?Patient is 66 year old male with multiple medical history including history of MI, acute renal failure, seizure, stroke who presents to the emergency department after positive COVID test at home and concern for shortness of breath.  Patient reports been having cough with mild sputum production for the past week.  Today he feels short of breath.  Denies associated chest pain.  Denies associated fever or chills.  His wife encouraged him to test for COVID and he was COVID-positive.  He brought to the emergency department due to concern for worsening shortness of breath.  He also reports bilateral leg swelling.  He does report a history of chronic smoking.  However he stopped 30 years ago.  He denies any tobacco use.  Denies abdominal pain, or diarrhea.  He does report 1 episode of emesis few days ago that has resolved since.  Otherwise no other complaints. ? ?Home Medications ?Prior to Admission medications   ?Medication Sig Start Date End Date Taking? Authorizing Provider  ?amLODipine (NORVASC) 10 MG tablet TAKE 1 TABLET (10 MG TOTAL) BY MOUTH DAILY. 09/05/21 09/05/22  Ladell Pier, MD  ?aspirin EC 81 MG tablet Take 1 tablet (81 mg total) by mouth daily. 12/10/17   Ladell Pier, MD  ?atorvastatin (LIPITOR) 40 MG tablet Take 1 tablet (40 mg total) by mouth daily at 6 pm. 09/05/21 03/29/22  Ladell Pier, MD  ?carvedilol (COREG) 25 MG tablet TAKE 1.5 TABLETS (37.5 MG TOTAL) BY MOUTH 2 (TWO) TIMES DAILY WITH A MEAL. 06/04/21 06/04/22  Ladell Pier, MD  ?furosemide (LASIX) 20 MG tablet TAKE 1 TABLET (20 MG TOTAL) BY MOUTH DAILY. 09/05/21 09/05/22  Ladell Pier, MD  ?glucose blood (ACCU-CHEK GUIDE) test strip Use as instructed 3 times daily DX code  E11.9 08/05/20   Elayne Snare, MD  ?hydrALAZINE (APRESOLINE) 50 MG tablet TAKE 1.5 TABLETS (75 MG TOTAL) BY MOUTH 2 (TWO) TIMES DAILY. 02/11/21 02/11/22  Ladell Pier, MD  ?insulin glargine (LANTUS SOLOSTAR) 100 UNIT/ML Solostar Pen Inject 32 Units into the skin daily. 04/11/21 12/30/21  Ladell Pier, MD  ?Insulin Pen Needle (TECHLITE PEN NEEDLES) 32G X 4 MM MISC Use as directed for insulin 04/16/21 04/16/22  Ladell Pier, MD  ?Insulin Syringe-Needle U-100 (INSULIN SYRINGE .5CC/30GX1/2") 30G X 1/2" 0.5 ML MISC Check blood sugar TID & QHS 11/16/17   Veryl Speak, MD  ?isosorbide dinitrate (ISORDIL) 20 MG tablet TAKE 2 TABLETS (40 MG TOTAL) BY MOUTH 2 (TWO) TIMES DAILY. 02/11/21 02/11/22  Ladell Pier, MD  ?Lancets (ACCU-CHEK SOFT TOUCH) lancets Use as instructed 02/15/18   Ladell Pier, MD  ?Lancets (ACCU-CHEK SOFT [facility]) lancets Use as instructed to check blood sugar 3 times daily Dx code E11.9 08/05/20   Elayne Snare, MD  ?lisinopril (ZESTRIL) 40 MG tablet TAKE 1 TABLET (40 MG TOTAL) BY MOUTH DAILY. 09/05/21 09/05/22  Ladell Pier, MD  ?TRUEPLUS LANCETS 28G MISC 1 each by Does not apply route 3 (three) times daily before meals. 02/15/18   Ladell Pier, MD  ?   ? ?Allergies    ?Trulicity [dulaglutide]   ? ?Review of Systems   ?Review of Systems ? ?Physical Exam ?Updated Vital Signs ?BP 117/72  Pulse 91   Temp 98.6 ?F (37 ?C) (Oral)   Resp (!) 33   Ht 5\' 7"  (1.702 m)   Wt 68.9 kg   SpO2 (!) 85%   BMI 23.81 kg/m?  ?Physical Exam ?Constitutional:   ?   Comments: Chronically ill-appearing  ?HENT:  ?   Head: Normocephalic.  ?   Nose: No congestion.  ?   Mouth/Throat:  ?   Mouth: Mucous membranes are moist.  ?   Pharynx: Oropharynx is clear.  ?Eyes:  ?   Extraocular Movements: Extraocular movements intact.  ?   Pupils: Pupils are equal, round, and reactive to light.  ?Cardiovascular:  ?   Rate and Rhythm: Normal rate.  ?Pulmonary:  ?   Effort: Respiratory distress present.  ?   Breath  sounds: Wheezing present.  ?   Comments: Right-sided wheezing that is greater than left side.  On 6 L oxygen ?Abdominal:  ?   General: There is no distension.  ?   Tenderness: There is no guarding or rebound.  ?Musculoskeletal:     ?   General: No swelling or tenderness.  ?   Cervical back: No rigidity.  ?   Right lower leg: Edema present.  ?   Left lower leg: Edema present.  ?Skin: ?   General: Skin is warm.  ?   Capillary Refill: Capillary refill takes 2 to 3 seconds.  ?Neurological:  ?   General: No focal deficit present.  ?   Mental Status: He is oriented to person, place, and time.  ?   Sensory: No sensory deficit.  ?   Motor: No weakness.  ? ? ?ED Results / Procedures / Treatments   ?Labs ?(all labs ordered are listed, but only abnormal results are displayed) ?Labs Reviewed  ?CBC - Abnormal; Notable for the following components:  ?    Result Value  ? WBC 13.7 (*)   ? RBC 3.78 (*)   ? Hemoglobin 11.2 (*)   ? HCT 33.4 (*)   ? All other components within normal limits  ?BASIC METABOLIC PANEL - Abnormal; Notable for the following components:  ? Sodium 134 (*)   ? Potassium 5.4 (*)   ? CO2 17 (*)   ? Glucose, Bld 422 (*)   ? BUN 61 (*)   ? Creatinine, Ser 3.79 (*)   ? Calcium 8.7 (*)   ? GFR, Estimated 17 (*)   ? All other components within normal limits  ?RESP PANEL BY RT-PCR (FLU A&B, COVID) [id number]  ?BRAIN NATRIURETIC PEPTIDE  ? ? ?EKG ?EKG Interpretation ? ?Date/Time:  Thursday January 15 2022 22:38:43 EDT ?Ventricular Rate:  99 ?PR Interval:  185 ?QRS Duration: 103 ?QT Interval:  355 ?QTC Calculation: 456 ?R Axis:   80 ?Text Interpretation: Sinus rhythm Probable left atrial enlargement LVH with secondary repolarization abnormality ST-t wave abnormality Abnormal ECG Confirmed by Carmin Muskrat [phone]) on 01/15/2022 10:44:35 PM ? ?Radiology ?DG Chest Portable 1 View ? ?Result Date: 01/15/2022 ?CLINICAL DATA:  COVID pneumonia, hypoxia EXAM: PORTABLE CHEST 1 VIEW COMPARISON:  02/19/2015 FINDINGS: Pulmonary  insufflation is normal and symmetric. There are of developed extensive bilateral perihilar and lower lung zone pulmonary infiltrates,. These are more suggestive of changes related to pulmonary edema, though infection is not excluded. Suspect small bilateral pleural effusion. Cardiac size is mildly enlarged. Central pulmonary arteries are enlarged in keeping with changes of pulmonary arterial hypertension. No pneumothorax. IMPRESSION: Mid and lower lung zone pulmonary infiltrates, possible small bilateral pleural  effusions, and mild cardiomegaly suggesting changes of mild to moderate cardiogenic failure, though pneumonic infiltrate is not completely excluded. Electronically Signed   By: Fidela Salisbury M.D.   On: 01/15/2022 23:05   ? ?Procedures ?Procedures  ? ?Medications Ordered in ED ?Medications  ?ceFEPIme (MAXIPIME) 1 g in sodium chloride 0.9 % 100 mL IVPB (1 g Intravenous New Bag/Given 01/15/22 2359)  ?furosemide (LASIX) injection 60 mg (has no administration in time range)  ?methylPREDNISolone sodium succinate (SOLU-MEDROL) 125 mg/2 mL injection 125 mg (125 mg Intravenous Given 01/16/22 0002)  ?ipratropium-albuterol (DUONEB) 0.5-2.5 (3) MG/3ML nebulizer solution 3 mL (3 mLs Nebulization Given 01/15/22 2305)  ? ? ?ED Course/ Medical Decision Making/ A&P ?  ?                        ?Medical Decision Making ?Problems Addressed: ?COVID: acute illness or injury that poses a threat to life or bodily functions ?Hypoxia: acute illness or injury that poses a threat to life or bodily functions ?SOB (shortness of breath): acute illness or injury that poses a threat to life or bodily functions ? ?Amount and/or Complexity of Data Reviewed ?Labs: ordered. Decision-making details documented in ED Course. ?Radiology: ordered and independent interpretation performed. Decision-making details documented in ED Course. ?ECG/medicine tests: ordered and independent interpretation performed. Decision-making details documented in ED  Course. ? ?Risk ?Prescription drug management. ?Decision regarding hospitalization. ? ? ?Patient is 66 year old male who presents to the emergency department for shortness of breath in the setting of a positive COVID home te

## 2022-01-15 NOTE — ED Notes (Signed)
Pt Spo2 73% on RA, RN placed pt on O2 via nasal cannula, the highest Spo2 reading was 89% on 6L O2. RN placed pt on NRB at 15L, SpO2 now 97%. Pt denies shob, no home oxygen use at baseline ?

## 2022-01-15 NOTE — ED Notes (Signed)
X-ray at bedside

## 2022-01-16 ENCOUNTER — Encounter (HOSPITAL_COMMUNITY): Payer: Self-pay | Admitting: Internal Medicine

## 2022-01-16 ENCOUNTER — Inpatient Hospital Stay (HOSPITAL_COMMUNITY): Payer: Medicare Other

## 2022-01-16 DIAGNOSIS — E871 Hypo-osmolality and hyponatremia: Secondary | ICD-10-CM | POA: Diagnosis not present

## 2022-01-16 DIAGNOSIS — N179 Acute kidney failure, unspecified: Secondary | ICD-10-CM | POA: Diagnosis not present

## 2022-01-16 DIAGNOSIS — I2510 Atherosclerotic heart disease of native coronary artery without angina pectoris: Secondary | ICD-10-CM | POA: Diagnosis present

## 2022-01-16 DIAGNOSIS — Z515 Encounter for palliative care: Secondary | ICD-10-CM | POA: Diagnosis not present

## 2022-01-16 DIAGNOSIS — Z7982 Long term (current) use of aspirin: Secondary | ICD-10-CM | POA: Diagnosis not present

## 2022-01-16 DIAGNOSIS — E875 Hyperkalemia: Secondary | ICD-10-CM | POA: Diagnosis not present

## 2022-01-16 DIAGNOSIS — J9601 Acute respiratory failure with hypoxia: Secondary | ICD-10-CM | POA: Diagnosis present

## 2022-01-16 DIAGNOSIS — I5023 Acute on chronic systolic (congestive) heart failure: Secondary | ICD-10-CM

## 2022-01-16 DIAGNOSIS — J440 Chronic obstructive pulmonary disease with acute lower respiratory infection: Secondary | ICD-10-CM | POA: Diagnosis not present

## 2022-01-16 DIAGNOSIS — E1110 Type 2 diabetes mellitus with ketoacidosis without coma: Secondary | ICD-10-CM | POA: Diagnosis not present

## 2022-01-16 DIAGNOSIS — I214 Non-ST elevation (NSTEMI) myocardial infarction: Secondary | ICD-10-CM | POA: Diagnosis not present

## 2022-01-16 DIAGNOSIS — I25119 Atherosclerotic heart disease of native coronary artery with unspecified angina pectoris: Secondary | ICD-10-CM | POA: Diagnosis not present

## 2022-01-16 DIAGNOSIS — E1122 Type 2 diabetes mellitus with diabetic chronic kidney disease: Secondary | ICD-10-CM | POA: Diagnosis present

## 2022-01-16 DIAGNOSIS — I6930 Unspecified sequelae of cerebral infarction: Secondary | ICD-10-CM | POA: Diagnosis not present

## 2022-01-16 DIAGNOSIS — N170 Acute kidney failure with tubular necrosis: Secondary | ICD-10-CM | POA: Diagnosis not present

## 2022-01-16 DIAGNOSIS — I480 Paroxysmal atrial fibrillation: Secondary | ICD-10-CM | POA: Diagnosis not present

## 2022-01-16 DIAGNOSIS — Z20822 Contact with and (suspected) exposure to covid-19: Secondary | ICD-10-CM | POA: Diagnosis not present

## 2022-01-16 DIAGNOSIS — Z66 Do not resuscitate: Secondary | ICD-10-CM | POA: Diagnosis present

## 2022-01-16 DIAGNOSIS — Z7189 Other specified counseling: Secondary | ICD-10-CM | POA: Diagnosis not present

## 2022-01-16 DIAGNOSIS — R0602 Shortness of breath: Secondary | ICD-10-CM | POA: Diagnosis present

## 2022-01-16 DIAGNOSIS — E785 Hyperlipidemia, unspecified: Secondary | ICD-10-CM | POA: Diagnosis present

## 2022-01-16 DIAGNOSIS — I5043 Acute on chronic combined systolic (congestive) and diastolic (congestive) heart failure: Secondary | ICD-10-CM | POA: Diagnosis not present

## 2022-01-16 DIAGNOSIS — R570 Cardiogenic shock: Secondary | ICD-10-CM | POA: Diagnosis not present

## 2022-01-16 DIAGNOSIS — J449 Chronic obstructive pulmonary disease, unspecified: Secondary | ICD-10-CM | POA: Diagnosis present

## 2022-01-16 DIAGNOSIS — N1832 Chronic kidney disease, stage 3b: Secondary | ICD-10-CM | POA: Diagnosis not present

## 2022-01-16 DIAGNOSIS — F1010 Alcohol abuse, uncomplicated: Secondary | ICD-10-CM | POA: Diagnosis present

## 2022-01-16 DIAGNOSIS — I130 Hypertensive heart and chronic kidney disease with heart failure and stage 1 through stage 4 chronic kidney disease, or unspecified chronic kidney disease: Secondary | ICD-10-CM | POA: Diagnosis not present

## 2022-01-16 DIAGNOSIS — N184 Chronic kidney disease, stage 4 (severe): Secondary | ICD-10-CM | POA: Diagnosis not present

## 2022-01-16 DIAGNOSIS — D631 Anemia in chronic kidney disease: Secondary | ICD-10-CM | POA: Diagnosis present

## 2022-01-16 DIAGNOSIS — K219 Gastro-esophageal reflux disease without esophagitis: Secondary | ICD-10-CM | POA: Diagnosis present

## 2022-01-16 DIAGNOSIS — J181 Lobar pneumonia, unspecified organism: Secondary | ICD-10-CM | POA: Diagnosis not present

## 2022-01-16 LAB — I-STAT ARTERIAL BLOOD GAS, ED
Acid-base deficit: 8 mmol/L — ABNORMAL HIGH (ref 0.0–2.0)
Bicarbonate: 15.9 mmol/L — ABNORMAL LOW (ref 20.0–28.0)
Calcium, Ion: 1.18 mmol/L (ref 1.15–1.40)
HCT: 26 % — ABNORMAL LOW (ref 39.0–52.0)
Hemoglobin: 8.8 g/dL — ABNORMAL LOW (ref 13.0–17.0)
O2 Saturation: 94 %
Patient temperature: 98.6
Potassium: 5.9 mmol/L — ABNORMAL HIGH (ref 3.5–5.1)
Sodium: 131 mmol/L — ABNORMAL LOW (ref 135–145)
TCO2: 17 mmol/L — ABNORMAL LOW (ref 22–32)
pCO2 arterial: 26.7 mmHg — ABNORMAL LOW (ref 32–48)
pH, Arterial: 7.382 (ref 7.35–7.45)
pO2, Arterial: 70 mmHg — ABNORMAL LOW (ref 83–108)

## 2022-01-16 LAB — CBC
HCT: 29.8 % — ABNORMAL LOW (ref 39.0–52.0)
Hemoglobin: 10.1 g/dL — ABNORMAL LOW (ref 13.0–17.0)
MCH: 29.7 pg (ref 26.0–34.0)
MCHC: 33.9 g/dL (ref 30.0–36.0)
MCV: 87.6 fL (ref 80.0–100.0)
Platelets: 174 10*3/uL (ref 150–400)
RBC: 3.4 MIL/uL — ABNORMAL LOW (ref 4.22–5.81)
RDW: 13.5 % (ref 11.5–15.5)
WBC: 10.2 10*3/uL (ref 4.0–10.5)
nRBC: 0 % (ref 0.0–0.2)

## 2022-01-16 LAB — BASIC METABOLIC PANEL
Anion gap: 15 (ref 5–15)
Anion gap: 15 (ref 5–15)
BUN: 70 mg/dL — ABNORMAL HIGH (ref 8–23)
BUN: 79 mg/dL — ABNORMAL HIGH (ref 8–23)
CO2: 13 mmol/L — ABNORMAL LOW (ref 22–32)
CO2: 16 mmol/L — ABNORMAL LOW (ref 22–32)
Calcium: 8.4 mg/dL — ABNORMAL LOW (ref 8.9–10.3)
Calcium: 8.8 mg/dL — ABNORMAL LOW (ref 8.9–10.3)
Chloride: 101 mmol/L (ref 98–111)
Chloride: 105 mmol/L (ref 98–111)
Creatinine, Ser: 4.29 mg/dL — ABNORMAL HIGH (ref 0.61–1.24)
Creatinine, Ser: 4.49 mg/dL — ABNORMAL HIGH (ref 0.61–1.24)
GFR, Estimated: 15 mL/min — ABNORMAL LOW (ref >60)
GFR, Estimated: 14 mL/min — ABNORMAL LOW (ref >60)
Glucose, Bld: 479 mg/dL — ABNORMAL HIGH (ref 70–99)
Glucose, Bld: 210 mg/dL — ABNORMAL HIGH (ref 70–99)
Potassium: 5.6 mmol/L — ABNORMAL HIGH (ref 3.5–5.1)
Potassium: 4.7 mmol/L (ref 3.5–5.1)
Sodium: 129 mmol/L — ABNORMAL LOW (ref 135–145)
Sodium: 136 mmol/L (ref 135–145)

## 2022-01-16 LAB — HEPARIN LEVEL (UNFRACTIONATED): Heparin Unfractionated: <0.1 IU/mL — ABNORMAL LOW (ref 0.30–0.70)

## 2022-01-16 LAB — TROPONIN I (HIGH SENSITIVITY)
Troponin I (High Sensitivity): 11409 ng/L (ref <18)
Troponin I (High Sensitivity): 13838 ng/L (ref <18)

## 2022-01-16 LAB — GLUCOSE, CAPILLARY
Glucose-Capillary: 174 mg/dL — ABNORMAL HIGH (ref 70–99)
Glucose-Capillary: 182 mg/dL — ABNORMAL HIGH (ref 70–99)
Glucose-Capillary: 191 mg/dL — ABNORMAL HIGH (ref 70–99)
Glucose-Capillary: 205 mg/dL — ABNORMAL HIGH (ref 70–99)
Glucose-Capillary: 217 mg/dL — ABNORMAL HIGH (ref 70–99)
Glucose-Capillary: 220 mg/dL — ABNORMAL HIGH (ref 70–99)
Glucose-Capillary: 220 mg/dL — ABNORMAL HIGH (ref 70–99)
Glucose-Capillary: 270 mg/dL — ABNORMAL HIGH (ref 70–99)
Glucose-Capillary: 363 mg/dL — ABNORMAL HIGH (ref 70–99)
Glucose-Capillary: 461 mg/dL — ABNORMAL HIGH (ref 70–99)
Glucose-Capillary: 476 mg/dL — ABNORMAL HIGH (ref 70–99)
Glucose-Capillary: 482 mg/dL — ABNORMAL HIGH (ref 70–99)
Glucose-Capillary: 483 mg/dL — ABNORMAL HIGH (ref 70–99)
Glucose-Capillary: 503 mg/dL (ref 70–99)

## 2022-01-16 LAB — ECHOCARDIOGRAM COMPLETE
AR max vel: 2.13 cm2
AV Area VTI: 2.02 cm2
AV Area mean vel: 2.14 cm2
AV Mean grad: 5 mmHg
AV Peak grad: 8.5 mmHg
Ao pk vel: 1.46 m/s
Area-P 1/2: 5.58 cm2
Height: 67 in
S' Lateral: 4.1 cm
Weight: 2836 oz

## 2022-01-16 LAB — CREATININE, SERUM
Creatinine, Ser: 3.93 mg/dL — ABNORMAL HIGH (ref 0.61–1.24)
GFR, Estimated: 16 mL/min — ABNORMAL LOW (ref >60)

## 2022-01-16 LAB — PROCALCITONIN: Procalcitonin: 7.73 ng/mL

## 2022-01-16 LAB — BRAIN NATRIURETIC PEPTIDE: B Natriuretic Peptide: 3498.1 pg/mL — ABNORMAL HIGH (ref 0.0–100.0)

## 2022-01-16 LAB — HEPATIC FUNCTION PANEL
ALT: 65 U/L — ABNORMAL HIGH (ref 0–44)
AST: 58 U/L — ABNORMAL HIGH (ref 15–41)
Albumin: 3 g/dL — ABNORMAL LOW (ref 3.5–5.0)
Alkaline Phosphatase: 46 U/L (ref 38–126)
Bilirubin, Direct: 0.3 mg/dL — ABNORMAL HIGH (ref 0.0–0.2)
Indirect Bilirubin: 1.1 mg/dL — ABNORMAL HIGH (ref 0.3–0.9)
Total Bilirubin: 1.4 mg/dL — ABNORMAL HIGH (ref 0.3–1.2)
Total Protein: 6 g/dL — ABNORMAL LOW (ref 6.5–8.1)

## 2022-01-16 LAB — MAGNESIUM: Magnesium: 1.6 mg/dL — ABNORMAL LOW (ref 1.7–2.4)

## 2022-01-16 LAB — BETA-HYDROXYBUTYRIC ACID: Beta-Hydroxybutyric Acid: 0.71 mmol/L — ABNORMAL HIGH (ref 0.05–0.27)

## 2022-01-16 LAB — HIV ANTIBODY (ROUTINE TESTING W REFLEX): HIV Screen 4th Generation wRfx: NONREACTIVE

## 2022-01-16 LAB — RESP PANEL BY RT-PCR (FLU A&B, COVID) ARPGX2
Influenza A by PCR: NEGATIVE
Influenza B by PCR: NEGATIVE
SARS Coronavirus 2 by RT PCR: NEGATIVE

## 2022-01-16 LAB — LACTIC ACID, PLASMA
Lactic Acid, Venous: 1.6 mmol/L (ref 0.5–1.9)
Lactic Acid, Venous: 3.3 mmol/L (ref 0.5–1.9)

## 2022-01-16 MED ORDER — FUROSEMIDE 10 MG/ML IJ SOLN: 80.0000 mg | Freq: Two times a day (BID) | INTRAMUSCULAR | Status: DC

## 2022-01-16 MED ORDER — FUROSEMIDE 10 MG/ML IJ SOLN
INTRAMUSCULAR | Status: AC
  Administered 2022-01-16: 40 mg via INTRAVENOUS
  Filled 2022-01-16: qty 4

## 2022-01-16 MED ORDER — ASPIRIN 300 MG RE SUPP
300.0000 mg | Freq: Once | RECTAL | Status: AC
  Administered 2022-01-16: 300 mg via RECTAL
  Filled 2022-01-16: qty 1

## 2022-01-16 MED ORDER — SODIUM CHLORIDE 0.9 % IV BOLUS: 250.0000 mL | Freq: Once | INTRAVENOUS | Status: DC

## 2022-01-16 MED ORDER — SODIUM CHLORIDE 0.9 % IV SOLN
500.0000 mg | INTRAVENOUS | Status: AC
  Administered 2022-01-16 – 2022-01-20 (×5): 500 mg via INTRAVENOUS
  Filled 2022-01-16 (×5): qty 5

## 2022-01-16 MED ORDER — ATORVASTATIN CALCIUM 80 MG PO TABS
80.0000 mg | ORAL_TABLET | Freq: Every day | ORAL | Status: DC
  Administered 2022-01-17 – 2022-01-22 (×6): 80 mg via ORAL
  Filled 2022-01-16 (×6): qty 1

## 2022-01-16 MED ORDER — ACETAMINOPHEN 650 MG RE SUPP: 650.0000 mg | Freq: Four times a day (QID) | RECTAL | Status: DC | PRN

## 2022-01-16 MED ORDER — DEXTROSE IN LACTATED RINGERS 5 % IV SOLN: INTRAVENOUS | Status: DC

## 2022-01-16 MED ORDER — DEXTROSE 50 % IV SOLN: 0.0000 mL | INTRAVENOUS | Status: DC | PRN

## 2022-01-16 MED ORDER — ACETAMINOPHEN 325 MG PO TABS: 650.0000 mg | ORAL_TABLET | Freq: Four times a day (QID) | ORAL | Status: DC | PRN

## 2022-01-16 MED ORDER — ALBUTEROL SULFATE (2.5 MG/3ML) 0.083% IN NEBU
2.5000 mg | INHALATION_SOLUTION | RESPIRATORY_TRACT | Status: DC | PRN
Start: 2022-01-16 — End: 2022-01-22

## 2022-01-16 MED ORDER — INSULIN REGULAR(HUMAN) IN NACL 100-0.9 UT/100ML-% IV SOLN
INTRAVENOUS | Status: DC
  Administered 2022-01-16: 12 [IU]/h via INTRAVENOUS
  Administered 2022-01-16: 15 [IU]/h via INTRAVENOUS
  Administered 2022-01-16: 11 [IU]/h via INTRAVENOUS
  Filled 2022-01-16: qty 100

## 2022-01-16 MED ORDER — FUROSEMIDE 10 MG/ML IJ SOLN
60.0000 mg | Freq: Once | INTRAMUSCULAR | Status: AC
  Administered 2022-01-16: 60 mg via INTRAVENOUS
  Filled 2022-01-16: qty 6

## 2022-01-16 MED ORDER — ATORVASTATIN CALCIUM 40 MG PO TABS
40.0000 mg | ORAL_TABLET | Freq: Every day | ORAL | Status: DC
  Administered 2022-01-16: 40 mg via ORAL
  Filled 2022-01-16: qty 1

## 2022-01-16 MED ORDER — SODIUM CHLORIDE 0.9 % IV SOLN
2.0000 g | INTRAVENOUS | Status: AC
  Administered 2022-01-16 – 2022-01-20 (×5): 2 g via INTRAVENOUS
  Filled 2022-01-16 (×5): qty 20

## 2022-01-16 MED ORDER — FUROSEMIDE 10 MG/ML IJ SOLN
60.0000 mg | Freq: Two times a day (BID) | INTRAMUSCULAR | Status: DC
  Administered 2022-01-16: 60 mg via INTRAVENOUS
  Filled 2022-01-16: qty 6

## 2022-01-16 MED ORDER — CARVEDILOL 25 MG PO TABS: 37.5000 mg | ORAL_TABLET | Freq: Two times a day (BID) | ORAL | Status: DC

## 2022-01-16 MED ORDER — INSULIN GLARGINE-YFGN 100 UNIT/ML ~~LOC~~ SOLN
32.0000 [IU] | Freq: Every day | SUBCUTANEOUS | Status: DC
  Administered 2022-01-17 – 2022-01-18 (×2): 32 [IU] via SUBCUTANEOUS
  Filled 2022-01-16 (×4): qty 0.32

## 2022-01-16 MED ORDER — LACTATED RINGERS IV BOLUS
500.0000 mL | Freq: Once | INTRAVENOUS | Status: AC
  Administered 2022-01-16: 500 mL via INTRAVENOUS

## 2022-01-16 MED ORDER — INSULIN GLARGINE-YFGN 100 UNIT/ML ~~LOC~~ SOLN
20.0000 [IU] | Freq: Once | SUBCUTANEOUS | Status: AC
  Administered 2022-01-16: 20 [IU] via SUBCUTANEOUS
  Filled 2022-01-16: qty 0.2

## 2022-01-16 MED ORDER — HEPARIN BOLUS VIA INFUSION
2000.0000 [IU] | Freq: Once | INTRAVENOUS | Status: AC
  Administered 2022-01-16: 2000 [IU] via INTRAVENOUS
  Filled 2022-01-16: qty 2000

## 2022-01-16 MED ORDER — INSULIN GLARGINE-YFGN 100 UNIT/ML ~~LOC~~ SOLN
32.0000 [IU] | Freq: Every day | SUBCUTANEOUS | Status: DC
  Filled 2022-01-16: qty 0.32

## 2022-01-16 MED ORDER — ASPIRIN EC 81 MG PO TBEC
81.0000 mg | DELAYED_RELEASE_TABLET | Freq: Every day | ORAL | Status: DC
  Administered 2022-01-16 – 2022-01-22 (×7): 81 mg via ORAL
  Filled 2022-01-16 (×7): qty 1

## 2022-01-16 MED ORDER — INSULIN ASPART 100 UNIT/ML IJ SOLN
0.0000 [IU] | INTRAMUSCULAR | Status: DC
  Administered 2022-01-16 (×3): 5 [IU] via SUBCUTANEOUS
  Administered 2022-01-17 (×2): 3 [IU] via SUBCUTANEOUS
  Administered 2022-01-17: 2 [IU] via SUBCUTANEOUS
  Administered 2022-01-17: 8 [IU] via SUBCUTANEOUS
  Administered 2022-01-17 (×2): 5 [IU] via SUBCUTANEOUS
  Administered 2022-01-18 – 2022-01-19 (×3): 3 [IU] via SUBCUTANEOUS
  Administered 2022-01-19: 2 [IU] via SUBCUTANEOUS
  Administered 2022-01-19: 3 [IU] via SUBCUTANEOUS
  Administered 2022-01-20 (×2): 2 [IU] via SUBCUTANEOUS

## 2022-01-16 MED ORDER — HEPARIN (PORCINE) 25000 UT/250ML-% IV SOLN
1300.0000 [IU]/h | INTRAVENOUS | Status: DC
  Administered 2022-01-16: 1000 [IU]/h via INTRAVENOUS
  Administered 2022-01-17 (×2): 1300 [IU]/h via INTRAVENOUS
  Filled 2022-01-16 (×3): qty 250

## 2022-01-16 MED ORDER — ADULT MULTIVITAMIN W/MINERALS CH
1.0000 | ORAL_TABLET | Freq: Every day | ORAL | Status: DC
  Administered 2022-01-17 – 2022-01-22 (×6): 1 via ORAL
  Filled 2022-01-16 (×6): qty 1

## 2022-01-16 MED ORDER — INSULIN ASPART 100 UNIT/ML IJ SOLN: 0.0000 [IU] | Freq: Three times a day (TID) | INTRAMUSCULAR | Status: DC

## 2022-01-16 MED ORDER — FUROSEMIDE 10 MG/ML IJ SOLN
80.0000 mg | Freq: Once | INTRAMUSCULAR | Status: AC
Start: 2022-01-16 — End: 2022-01-16
  Administered 2022-01-16: 80 mg via INTRAVENOUS
  Filled 2022-01-16: qty 8

## 2022-01-16 MED ORDER — PROSOURCE PLUS PO LIQD
30.0000 mL | Freq: Three times a day (TID) | ORAL | Status: DC
  Administered 2022-01-16 – 2022-01-18 (×5): 30 mL via ORAL
  Filled 2022-01-16 (×6): qty 30

## 2022-01-16 MED ORDER — HEPARIN SODIUM (PORCINE) 5000 UNIT/ML IJ SOLN: 5000.0000 [IU] | Freq: Three times a day (TID) | INTRAMUSCULAR | Status: DC

## 2022-01-16 MED ORDER — AMLODIPINE BESYLATE 10 MG PO TABS: 10.0000 mg | ORAL_TABLET | Freq: Every day | ORAL | Status: DC

## 2022-01-16 MED ORDER — ISOSORBIDE DINITRATE 20 MG PO TABS
40.0000 mg | ORAL_TABLET | Freq: Two times a day (BID) | ORAL | Status: DC
  Filled 2022-01-16 (×2): qty 2

## 2022-01-16 MED ORDER — HYDRALAZINE HCL 50 MG PO TABS: 75.0000 mg | ORAL_TABLET | Freq: Two times a day (BID) | ORAL | Status: DC

## 2022-01-16 NOTE — Progress Notes (Signed)
Pt refused foley cath. MD made aware.  ?

## 2022-01-16 NOTE — Consult Note (Signed)
Reason for Consult:AKI on CKD 3b ?Referring Physician: [facility]  ? ?Chief Complaint: Dyspnea  ? ?Assessment/Plan: ?AKI on CKD 3b ?Cr 4.29 today up from 3.0 on 4/11. GFR 15. K+ 5.6, BUN 70, CO2 13 . Creatinine progressively worsening over the past few years, was 2.77 last year and 2.3 the year prior. Suspect AKI is due to cardio renal etiology. 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 Was given lasix 60mg  IV x 2 over night with 1 unmeasaured urine over night. Bladder scan 231 prior to voiding 225 mL this morning ?Does follow with [facility] outpatient. At home on lasix 20mg  daily though patient states he has been out of it for about a month.  ?- increase Lasix to 80mg  IV BID  ?- strict Is and Os ?- daily weights  ?- urinalysis  ?- urine Na and Cr  ?- f/u renal US  ?- pm BMP   ?Acute on chronic combined systolic and diastolic CHF. Cardiology and HF following. Last echo 2016 with EF 50-55. Repeat echo EF 40-45%, No RWA, G2DD. IVC dilated suggesting RAP of 15 mmHg  ?NSTEMI- trop 13.838 on IV heparin. Hx CAD with prior LAD PCI in 2016 ?Cardiogenic shock- Became hypotensive this morning with BP 82/58 and was transferred to ICU. BP currently 109/80 ?Uncontrolled DM2- A1c 14.6. CBG 503. At home on Lantus 32u daily. On insulin gtt   ?Acute hypoxic respiratory fialure- Currently on BiPaP. CXR with mid and lower lung zone pulmonary infiltrates possible mild bilateral pleural effusions but cant exclude pneumonic infiltrate. Started on azithromycin and CTX  ?PAF- in NSR. Not on anticoagulation. Cardiology following  ?  ?[name] is an 66 y.o. male with history of CKD 3b, HFmrEF( EF 50-55% in 2016 now 40-45%), uncontrolled DM2, prior alcohol abuse, hypertension and coronary artery disease status post anterior STEMI in May 2016 , PAF and prior stroke who had a history of alcohol withdrawal seizure. Presents with a week of worsening SOB, cough, worsening LE edema. Home COVID test positive but  negative here. Patient on Bipap when I examined him so history was limited. He does state he follows with a nephrologist outpatient but he missed his last appointment. He was told he had worsening kidney disease. [facility]  ? ?On arrival BNP 3400, trop 11,000>13,000 ?EKG showed ST depression in the lateral leads found to have NSTEMI was put on IV heparin. Currently being treated for pneumonia with Azithromycin and CTX given CXR findings concerning for pulm edema vs pneumonia. Also given IV Lasix 60mg  x2  and was hypoxic and placed on BiPAP.  ?He became hypotensive this morning to 82/58 and given concern for cardiogenic shock was transferred to ICU  ? ? ? ?Chemistry and CBC: ?Creat  ?Date/Time Value Ref Range Status  ?09/06/2015 11:33 AM 1.19 0.70 - 1.33 mg/dL Final  ?03/27/2015 11:48 AM 1.03 0.50 - 1.35 mg/dL Final  ?02/25/2015 10:06 AM 1.18 0.50 - 1.35 mg/dL Final  ? ?Creatinine, Ser  ?Date/Time Value Ref Range Status  ?01/16/2022 05:01 AM 4.29 (H) 0.61 - 1.24 mg/dL Final  ?01/16/2022 02:58 AM 3.93 (H) 0.61 - 1.24 mg/dL Final  ?01/15/2022 11:04 PM 3.79 (H) 0.61 - 1.24 mg/dL Final  ?12/30/2021 01:49 PM 3.00 (H) 0.76 - 1.27 mg/dL Final  ?09/05/2021 11:29 AM 2.77 (H) 0.76 - 1.27 mg/dL Final  ?05/17/2020 05:46 PM 2.39 (H) 0.61 - 1.24 mg/dL Final  ?11/17/2019 02:58 PM 2.30 (H) 0.76 - 1.27 mg/dL Final  ?03/13/2019 04:39  PM 2.05 (H) 0.76 - 1.27 mg/dL Final  ?12/10/2017 02:51 PM 1.82 (H) 0.76 - 1.27 mg/dL Final  ?06/19/2015 10:15 AM 1.27 (H) 0.61 - 1.24 mg/dL Final  ?06/06/2015 11:17 AM 1.38 (H) 0.61 - 1.24 mg/dL Final  ?04/25/2015 12:17 PM 1.43 (H) 0.61 - 1.24 mg/dL Final  ?04/02/2015 10:14 AM 1.06 0.40 - 1.50 mg/dL Final  ?02/21/2015 05:46 AM 1.53 (H) 0.61 - 1.24 mg/dL Final  ?02/20/2015 05:09 AM 2.30 (H) 0.61 - 1.24 mg/dL Final  ?02/19/2015 08:31 PM 3.00 (H) 0.61 - 1.24 mg/dL Final  ?02/19/2015 08:20 PM 2.94 (H) 0.61 - 1.24 mg/dL Final  ?02/11/2015 06:40 AM 1.24 0.61 - 1.24 mg/dL Final   ?02/09/2015 06:49 AM 1.96 (H) 0.61 - 1.24 mg/dL Final  ?02/08/2015 05:00 PM 2.56 (H) 0.61 - 1.24 mg/dL Final  ?02/04/2015 06:40 AM 1.55 (H) 0.61 - 1.24 mg/dL Final  ?01/31/2015 02:53 AM 1.48 (H) 0.61 - 1.24 mg/dL Final  ?01/30/2015 03:15 AM 1.37 (H) 0.61 - 1.24 mg/dL Final  ?01/29/2015 04:30 AM 1.47 (H) 0.61 - 1.24 mg/dL Final  ?01/28/2015 04:28 AM 1.83 (H) 0.61 - 1.24 mg/dL Final  ?01/27/2015 04:53 AM 1.81 (H) 0.61 - 1.24 mg/dL Final  ?01/26/2015 02:09 AM 1.81 (H) 0.61 - 1.24 mg/dL Final  ?01/25/2015 04:00 AM 1.61 (H) 0.61 - 1.24 mg/dL Final  ?01/24/2015 06:00 PM 1.70 (H) 0.61 - 1.24 mg/dL Final  ?01/24/2015 03:49 AM 1.85 (H) 0.61 - 1.24 mg/dL Final  ?01/23/2015 05:30 AM 1.66 (H) 0.61 - 1.24 mg/dL Final  ?01/22/2015 11:19 PM 1.40 (H) 0.61 - 1.24 mg/dL Final  ?01/22/2015 10:50 PM 1.56 (H) 0.61 - 1.24 mg/dL Final  ? ?Recent Labs  ?Lab 01/15/22 ?2304 01/16/22 ?0215 01/16/22 ?0258 01/16/22 ?0501  ?NA 134* 131*  --  129*  ?K 5.4* 5.9*  --  5.6*  ?CL 104  --   --  101  ?CO2 17*  --   --  13*  ?GLUCOSE 422*  --   --  479*  ?BUN 61*  --   --  70*  ?CREATININE 3.79*  --  3.93* 4.29*  ?CALCIUM 8.7*  --   --  8.4*  ? ?Recent Labs  ?Lab 01/15/22 ?2304 01/16/22 ?0215 01/16/22 ?0623  ?WBC 13.7*  --  10.2  ?HGB 11.2* 8.8* 10.1*  ?HCT 33.4* 26.0* 29.8*  ?MCV 88.4  --  87.6  ?PLT 185  --  174  ? ?Liver Function Tests: ?Recent Labs  ?Lab 01/16/22 ?0501  ?AST 58*  ?ALT 65*  ?ALKPHOS 46  ?BILITOT 1.4*  ?PROT 6.0*  ?ALBUMIN 3.0*  ? ?No results for input(s): LIPASE, AMYLASE in the last 168 hours. ?No results for input(s): AMMONIA in the last 168 hours. ?Cardiac Enzymes: ?No results for input(s): CKTOTAL, CKMB, CKMBINDEX, TROPONINI in the last 168 hours. ?Iron Studies: No results for input(s): IRON, TIBC, TRANSFERRIN, FERRITIN in the last 72 hours. ?PT/INR: ?@LABRCNTIP (inr:5) ? ?Xrays/Other Studies: ?) ?Results for orders placed or performed during the hospital encounter of 01/15/22 (from the past 48 hour(s))  ?CBC     Status: Abnormal   ? Collection Time: 01/15/22 11:04 PM  ?Result Value Ref Range  ? WBC 13.7 (H) 4.0 - 10.5 K/uL  ? RBC 3.78 (L) 4.22 - 5.81 MIL/uL  ? Hemoglobin 11.2 (L) 13.0 - 17.0 g/dL  ? HCT 33.4 (L) 39.0 - 52.0 %  ? MCV 88.4 80.0 - 100.0 fL  ? MCH 29.6 26.0 - 34.0 pg  ? MCHC 33.5 30.0 - 36.0 g/dL  ?  RDW 13.4 11.5 - 15.5 %  ? Platelets 185 150 - 400 K/uL  ? nRBC 0.0 0.0 - 0.2 %  ?  Comment: Performed at [facility], [place] [address] [place], [place] [address]  ?Basic metabolic panel     Status: Abnormal  ? Collection Time: 01/15/22 11:04 PM  ?Result Value Ref Range  ? Sodium 134 (L) 135 - 145 mmol/L  ? Potassium 5.4 (H) 3.5 - 5.1 mmol/L  ? Chloride 104 98 - 111 mmol/L  ? CO2 17 (L) 22 - 32 mmol/L  ? Glucose, Bld 422 (H) 70 - 99 mg/dL  ?  Comment: Glucose reference range applies only to samples taken after fasting for at least 8 hours.  ? BUN 61 (H) 8 - 23 mg/dL  ? Creatinine, Ser 3.79 (H) 0.61 - 1.24 mg/dL  ? Calcium 8.7 (L) 8.9 - 10.3 mg/dL  ? GFR, Estimated 17 (L) >60 mL/min  ?  Comment: (NOTE) ?Calculated using the CKD-EPI Creatinine Equation (2021) ?  ? Anion gap 13 5 - 15  ?  Comment: Performed at [facility], [place] [address] [place], [place] [address]  ?Brain natriuretic peptide     Status: Abnormal  ? Collection Time: 01/15/22 11:04 PM  ?Result Value Ref Range  ? B Natriuretic Peptide 3,498.1 (H) 0.0 - 100.0 pg/mL  ?  Comment: Performed at [facility], [place] [address], [place], [place] [address]  ?Resp Panel by RT-PCR (Flu A&B, Covid) Nasopharyngeal Swab     Status: None  ? Collection Time: 01/16/22 12:08 AM  ? Specimen: Nasopharyngeal Swab; Nasopharyngeal(NP) swabs in vial transport medium  ?Result Value Ref Range  ? SARS Coronavirus 2 by RT PCR NEGATIVE NEGATIVE  ?  Comment: (NOTE) ?SARS-CoV-2 target nucleic acids are NOT DETECTED. ? ?The SARS-CoV-2 RNA is generally detectable in upper respiratory ?specimens during the acute phase of infection. The lowest ?concentration of SARS-CoV-2 viral copies this  assay can detect is ?138 copies/mL. A negative result does not preclude SARS-Cov-2 ?infection and should not be used as the sole basis for treatment or ?other patient management decisions. A negative result may

## 2022-01-16 NOTE — ED Notes (Signed)
Admitting provider made aware of patient's spo2 at 86% on 6 liters via nasal cannula.  ?

## 2022-01-16 NOTE — ED Notes (Signed)
Admitting provider paged again in regards to patient's spo2 remaining at 88% on 10 liters. Per provider will place order for bipap at this time. RT paged and made aware of same.  ?

## 2022-01-16 NOTE — TOC CM/SW Note (Signed)
HF TOC CM spoke to pt at bedside. Gave permission speak wife, [name]. Spoke to wife. And pt has cane and scale at home. She drives him to appt if needed. Will continue to follow for dc needs. Jonnie Finner RN3 CCM, Heart Failure TOC CM [phone]  ?

## 2022-01-16 NOTE — Progress Notes (Signed)
PROGRESS NOTE ? ?Subjective: ?He is grumpy this morning. Feeling he's breathing fine, says he wasn't short of breath until he got here. Irritated that I asked to confirm his name. Wants off BiPAP to eat and drink.  ? ?Objective: ?BP 103/68   Pulse 89   Temp 98.5 ?F (36.9 ?C) (Axillary)   Resp (!) 27   Ht 5\' 7"  (1.702 m)   Wt 80.4 kg   SpO2 100%   BMI 27.76 kg/m?   ?Gen: Chronically ill-appearing male on BiPAP ?Pulm: Tachypneic, bibasilar crackles, diminished air exchange. ?CV: RRR, no murmur, laterally displaced PMI. Trace LE edema. ?GI: Soft, NT, ND, +BS  ?Neuro: Alert and oriented. No focal deficits. ?Skin: No rashes, lesions or ulcers on visualized skin. ? ?Assessment & Plan: ?RONAK DUQUETTE is a 66 y.o. male with a history of STEMI, DES to LAD, CVA, EtOH abuse, AFib, ICM, CKD III and poorly-controlled T2DM. Patient presented for shortness of breath at the urging of his wife who reported a positive covid home test. He was hypoxic with escalating oxygen requirements, ultimately placed on BiPAP, also found to have ST depressions in lateral leads on ECG and troponin 11k > 13k. CXR was consistent with pulmonary edema +/- infiltrate. Creatinine has risen associated with hyperkalemia and worsening mixed metabolic acidosis. SARS-CoV-2 PCR is negative here. Antibiotics, IV lasix, and IV heparin were initiated and Mr. Rodenberg was admitted this morning by Dr. Hal Hope for acute CHF. Due to persistent hyperglycemia, progressive acidosis with widening anion gap and hyperkalemia, insulin infusion was started. Had some urinary retention earlier that has resolved, though appears to be oliguric. He's becoming hypotensive, remains on BiPAP. ?- Cardiology consulted, recommending transfer to ICU for advanced heart failure team therapies. I've consulted PCCM for transfer of care and nephrology given his worsening oliguric renal failure and metabolic derangements.  ?- DNR is confirmed, though pt is currently consenting  to ICU transfer and invasive procedures if felt to be necessary. ? ?Patrecia Pour, MD ?Pager on amion ?01/16/2022, 11:20 AM   ?

## 2022-01-16 NOTE — Progress Notes (Signed)
CCM interval progress note ? ?CBGs have improved now 170-190  ?BMP with AG 15. Serum bicarb uptrending (still low) K now WNL ?Cr continues to uptrend -- now 4.49 ? ?Remains HDS and on HFNC  ? ?Talked with pt and wife again about central access -- again declines CVC or PICC.  ? ? ?DKA ?AKI ?Acute systolic HF ?NSTEMI ?Acute respiratory failure with hypoxia  ?Plan ?-transition off of insulin gtt. Dc gtt 1hr after basal given  ?- basal + q4hr SSI -- room to incr SSI coverage as needed  ?-very modest amount of fluids given to facilitate insulin gtt weaning -- no indication for mIVF ?-will order 80mg  lasix tonight ?-AM BMP ?-DM coordinator consult placed  ?-Cont O2 support via HFNC, PRN BiPAP  ? ? ? ? ?Eliseo Gum MSN, AGACNP-BC ?[facility] ?01/16/2022, 4:23 PM ? ?

## 2022-01-16 NOTE — H&P (Addendum)
History and Physical    Timothy Lambert WUJ:[id number] DOB: [birth year] DOA: 01/15/2022  PCP: Marcine Matar, MD  Patient coming from: Home.  Chief Complaint: Shortness of breath.  [name] is a 66 y.o. male with history of diastolic dysfunction per 2D echo done in 2016, CAD, CVA, paroxysmal atrial fibrillation, diabetes mellitus was brought to the ER the patient was getting increasingly short of breath.  Patient's wife states that patient was experiencing increasing shortness of breath and some cough over the last 1 week.  Is not sure patient has been compliant with his medications.  Recent hemoglobin A1c was around 14.  Patient also noticed increasing lower extremity edema.  Per wife patient tested positive for COVID at home test.  ED Course: In the ER patient was hypoxic initially requiring 4 L oxygen and at the time of my exam was on 10 L oxygen.  Chest x-ray shows congestion consistent with CHF.  There was some question for pneumonia.  COVID and flu test were negative.  BNP was 3400.  EKG shows ST depression in the lateral leads and nonspecific changes in the V1 and aVR.  Patient placed on IV Lasix.  Antibiotics.  Patient eventually had to be placed on BiPAP for hypoxia.  Review of Systems: As per HPI, rest all negative.   Past Medical History:  Diagnosis Date   Acute renal failure (ARF) (HCC) 02/19/2015   CHF (congestive heart failure) (HCC)    Coronary artery disease    GERD (gastroesophageal reflux disease)    HLD (hyperlipidemia)    Hypertension    Myocardial infarction (HCC) 01/22/2015   "massive"   Refusal of blood transfusions as patient is Jehovah's Witness    Seizures (HCC) 01/22/2015   "in front of paramedics"   Stroke (HCC) 01/22/2015   "short term memory loss & right side weak since" (02/19/2015)   Type II diabetes mellitus (HCC)     Past Surgical History:  Procedure Laterality Date   CARDIAC CATHETERIZATION N/A 01/23/2015   Procedure: Left Heart Cath and  Coronary Angiography;  Surgeon: Tonny Bollman, MD;  Location: [facility] INVASIVE CV LAB CUPID;  Service: Cardiovascular;  Laterality: N/A;   CARDIAC CATHETERIZATION N/A 01/23/2015   Procedure: Coronary Stent Intervention;  Surgeon: Tonny Bollman, MD;  Location: [facility] INVASIVE CV LAB CUPID;  Service: Cardiovascular;  Laterality: N/A;     reports that he has quit smoking. His smoking use included cigarettes. He has a 4.62 pack-year smoking history. He has never used smokeless tobacco. He reports that he does not drink alcohol and does not use drugs.  Allergies  Allergen Reactions   Trulicity [Dulaglutide]     Upset his stomach    Family History  Problem Relation Age of Onset   Diabetes Father    Seizures Neg Hx     Prior to Admission medications   Medication Sig Start Date End Date Taking? Authorizing Provider  amLODipine (NORVASC) 10 MG tablet TAKE 1 TABLET (10 MG TOTAL) BY MOUTH DAILY. Patient taking differently: Take 10 mg by mouth daily. 09/05/21 09/05/22  Marcine Matar, MD  aspirin EC 81 MG tablet Take 1 tablet (81 mg total) by mouth daily. 12/10/17   Marcine Matar, MD  atorvastatin (LIPITOR) 40 MG tablet Take 1 tablet (40 mg total) by mouth daily at 6 pm. 09/05/21 03/29/22  Marcine Matar, MD  carvedilol (COREG) 25 MG tablet TAKE 1.5 TABLETS (37.5 MG TOTAL) BY MOUTH 2 (TWO) TIMES DAILY WITH A MEAL.  Patient taking differently: Take 37.5 mg by mouth 2 (two) times daily with a meal. 06/04/21 06/04/22  Marcine Matar, MD  furosemide (LASIX) 20 MG tablet TAKE 1 TABLET (20 MG TOTAL) BY MOUTH DAILY. Patient taking differently: Take 20 mg by mouth daily. 09/05/21 09/05/22  Marcine Matar, MD  glucose blood (ACCU-CHEK GUIDE) test strip Use as instructed 3 times daily DX code E11.9 08/05/20   Reather Littler, MD  hydrALAZINE (APRESOLINE) 50 MG tablet TAKE 1.5 TABLETS (75 MG TOTAL) BY MOUTH 2 (TWO) TIMES DAILY. Patient taking differently: Take 75 mg by mouth in the morning and at bedtime.  02/11/21 02/11/22  Marcine Matar, MD  insulin glargine (LANTUS SOLOSTAR) 100 UNIT/ML Solostar Pen Inject 32 Units into the skin daily. 04/11/21 12/30/21  Marcine Matar, MD  Insulin Pen Needle (TECHLITE PEN NEEDLES) 32G X 4 MM MISC Use as directed for insulin 04/16/21 04/16/22  Marcine Matar, MD  Insulin Syringe-Needle U-100 (INSULIN SYRINGE .5CC/30GX1/2") 30G X 1/2" 0.5 ML MISC Check blood sugar TID & QHS 11/16/17   Geoffery Lyons, MD  isosorbide dinitrate (ISORDIL) 20 MG tablet TAKE 2 TABLETS (40 MG TOTAL) BY MOUTH 2 (TWO) TIMES DAILY. Patient taking differently: 40 mg 2 (two) times daily. 02/11/21 02/11/22  Marcine Matar, MD  Lancets (ACCU-CHEK SOFT TOUCH) lancets Use as instructed 02/15/18   Marcine Matar, MD  Lancets (ACCU-CHEK SOFT [facility]) lancets Use as instructed to check blood sugar 3 times daily Dx code E11.9 08/05/20   Reather Littler, MD  lisinopril (ZESTRIL) 40 MG tablet TAKE 1 TABLET (40 MG TOTAL) BY MOUTH DAILY. 09/05/21 09/05/22  Marcine Matar, MD  TRUEPLUS LANCETS 28G MISC 1 each by Does not apply route 3 (three) times daily before meals. 02/15/18   Marcine Matar, MD    Physical Exam: Constitutional: Moderately built and nourished. Vitals:   01/16/22 0044 01/16/22 0051 01/16/22 0052 01/16/22 0100  BP:    111/76  Pulse: 91 88 88 89  Resp: (!) 30 (!) 33 (!) 32 (!) 33  Temp:      TempSrc:      SpO2: (!) 85% (!) 89% (!) 88% (!) 88%  Weight:      Height:       Eyes: Anicteric no pallor. ENMT: No discharge from the ears eyes nose and mouth. Neck: JVD elevated no mass felt. Respiratory: No rhonchi or crepitations. Cardiovascular: S1-S2 heard. Abdomen: Soft nontender bowel sound present. Musculoskeletal: Bilateral lower extremity edema present. Skin: No rash. Neurologic: Alert awake oriented time place and person.  Moves all extremities. Psychiatric: Appears normal.  Normal affect.   Labs on Admission: I have personally reviewed following labs and  imaging studies  CBC: Recent Labs  Lab 01/15/22 2304  WBC 13.7*  HGB 11.2*  HCT 33.4*  MCV 88.4  PLT 185   Basic Metabolic Panel: Recent Labs  Lab 01/15/22 2304  NA 134*  K 5.4*  CL 104  CO2 17*  GLUCOSE 422*  BUN 61*  CREATININE 3.79*  CALCIUM 8.7*   GFR: Estimated Creatinine Clearance: 18.2 mL/min (A) (by C-G formula based on SCr of 3.79 mg/dL (H)). Liver Function Tests: No results for input(s): AST, ALT, ALKPHOS, BILITOT, PROT, ALBUMIN in the last 168 hours. No results for input(s): LIPASE, AMYLASE in the last 168 hours. No results for input(s): AMMONIA in the last 168 hours. Coagulation Profile: No results for input(s): INR, PROTIME in the last 168 hours. Cardiac Enzymes: No results for input(s):  CKTOTAL, CKMB, CKMBINDEX, TROPONINI in the last 168 hours. BNP (last 3 results) No results for input(s): PROBNP in the last 8760 hours. HbA1C: No results for input(s): HGBA1C in the last 72 hours. CBG: No results for input(s): GLUCAP in the last 168 hours. Lipid Profile: No results for input(s): CHOL, HDL, LDLCALC, TRIG, CHOLHDL, LDLDIRECT in the last 72 hours. Thyroid Function Tests: No results for input(s): TSH, T4TOTAL, FREET4, T3FREE, THYROIDAB in the last 72 hours. Anemia Panel: No results for input(s): VITAMINB12, FOLATE, FERRITIN, TIBC, IRON, RETICCTPCT in the last 72 hours. Urine analysis:    Component Value Date/Time   COLORURINE YELLOW 02/09/2015 0418   APPEARANCEUR Clear 08/30/2020 0954   LABSPEC 1.012 02/09/2015 0418   PHURINE 5.5 02/09/2015 0418   GLUCOSEU Negative 08/30/2020 0954   HGBUR NEGATIVE 02/09/2015 0418   BILIRUBINUR Negative 08/30/2020 0954   KETONESUR NEGATIVE 02/09/2015 0418   PROTEINUR 2+ (A) 08/30/2020 0954   PROTEINUR NEGATIVE 02/09/2015 0418   UROBILINOGEN 0.2 02/09/2015 0418   NITRITE Negative 08/30/2020 0954   NITRITE NEGATIVE 02/09/2015 0418   LEUKOCYTESUR Negative 08/30/2020 0954   Sepsis  Labs: @LABRCNTIP (procalcitonin:4,lacticidven:4) ) Recent Results (from the past 240 hour(s))  Resp Panel by RT-PCR (Flu A&B, Covid) Nasopharyngeal Swab     Status: None   Collection Time: 01/16/22 12:08 AM   Specimen: Nasopharyngeal Swab; Nasopharyngeal(NP) swabs in vial transport medium  Result Value Ref Range Status   SARS Coronavirus 2 by RT PCR NEGATIVE NEGATIVE Final    Comment: (NOTE) SARS-CoV-2 target nucleic acids are NOT DETECTED.  The SARS-CoV-2 RNA is generally detectable in upper respiratory specimens during the acute phase of infection. The lowest concentration of SARS-CoV-2 viral copies this assay can detect is 138 copies/mL. A negative result does not preclude SARS-Cov-2 infection and should not be used as the sole basis for treatment or other patient management decisions. A negative result may occur with  improper specimen collection/handling, submission of specimen other than nasopharyngeal swab, presence of viral mutation(s) within the areas targeted by this assay, and inadequate number of viral copies(<138 copies/mL). A negative result must be combined with clinical observations, patient history, and epidemiological information. The expected result is Negative.  Fact Sheet for Patients:  BloggerCourse.com  Fact Sheet for Healthcare Providers:  SeriousBroker.it  This test is no t yet approved or cleared by the [place] FDA and  has been authorized for detection and/or diagnosis of SARS-CoV-2 by FDA under an Emergency Use Authorization (EUA). This EUA will remain  in effect (meaning this test can be used) for the duration of the COVID-19 declaration under Section 564(b)(1) of the Act, 21 U.S.C.section 360bbb-3(b)(1), unless the authorization is terminated  or revoked sooner.       Influenza A by PCR NEGATIVE NEGATIVE Final   Influenza B by PCR NEGATIVE NEGATIVE Final    Comment: (NOTE) The Xpert Xpress  SARS-CoV-2/FLU/RSV plus assay is intended as an aid in the diagnosis of influenza from Nasopharyngeal swab specimens and should not be used as a sole basis for treatment. Nasal washings and aspirates are unacceptable for Xpert Xpress SARS-CoV-2/FLU/RSV testing.  Fact Sheet for Patients: BloggerCourse.com  Fact Sheet for Healthcare Providers: SeriousBroker.it  This test is not yet approved or cleared by the [place] FDA and has been authorized for detection and/or diagnosis of SARS-CoV-2 by FDA under an Emergency Use Authorization (EUA). This EUA will remain in effect (meaning this test can be used) for the duration of the COVID-19 declaration under Section 564(b)(1) of  the Act, 21 U.S.C. section 360bbb-3(b)(1), unless the authorization is terminated or revoked.  Performed at [facility], [address] [place], [place] [address]      Radiological Exams on Admission: DG Chest Portable 1 View  Result Date: 01/15/2022 CLINICAL DATA:  COVID pneumonia, hypoxia EXAM: PORTABLE CHEST 1 VIEW COMPARISON:  02/19/2015 FINDINGS: Pulmonary insufflation is normal and symmetric. There are of developed extensive bilateral perihilar and lower lung zone pulmonary infiltrates,. These are more suggestive of changes related to pulmonary edema, though infection is not excluded. Suspect small bilateral pleural effusion. Cardiac size is mildly enlarged. Central pulmonary arteries are enlarged in keeping with changes of pulmonary arterial hypertension. No pneumothorax. IMPRESSION: Mid and lower lung zone pulmonary infiltrates, possible small bilateral pleural effusions, and mild cardiomegaly suggesting changes of mild to moderate cardiogenic failure, though pneumonic infiltrate is not completely excluded. Electronically Signed   By: Helyn Numbers M.D.   On: 01/15/2022 23:05    EKG: Independently reviewed.  Normal sinus rhythm with ST depressions in  the lateral leads and nonspecific changes in the V1 and aVR.  Assessment/Plan Principal Problem:   Acute respiratory failure with hypoxemia (HCC) Active Problems:   Essential hypertension   CAD (coronary artery disease)   Paroxysmal atrial fibrillation (HCC)   Diabetes (HCC)   Acute respiratory failure with hypoxia (HCC)   Acute on chronic systolic CHF (congestive heart failure) (HCC)    Acute respiratory failure with hypoxia presently on BiPAP secondary to acute CHF last EF measured was in 2016 showed EF of 50 to 55% with grade 1 diastolic dysfunction presently on Lasix 60 mg IV every 12.  Closely follow intake output metabolic panel check 2D echo.  Trend cardiac markers.  Since there was concern for possible pneumonia patient was empirically started on antibiotic which can be discontinued if procalcitonin is negative. History of CAD presently denies chest pain but is short of breath.  Will trend cardiac markers.  On aspirin beta-blockers and statin. Hypertension on Coreg hydralazine and Isordil and amlodipine. Diabetes mellitus type 2 with hyperglycemia recent hemoglobin A1c 2 weeks ago was 14.  We will repeat metabolic panel if there is any worsening of anion gap we will start insulin infusion for DKA.  Otherwise we will continue with Lantus and sliding scale. Acute on chronic kidney disease stage III creatinine has worsened from 2.7 in December 2022 it is around 3.7.  Could be cardiorenal syndrome.  Closely monitor.  UA pending. Anemia likely from renal disease follow CBC. History of paroxysmal atrial fibrillation not on anticoagulation with prior history of alcohol abuse.  Presently in sinus rhythm.  Since patient has CHF with respiratory failure will need close monitoring inpatient status.   Addendum -patient's troponin came back as 11,000.  Discussed with cardiologist on-call Dr. Okey Dupre advise starting patient on heparin infusion.  Trending cardiac markers.  Aspirin.  Will be seeing  patient in consult.   DVT prophylaxis: Heparin infusion. Code Status: Full code. Family Communication: Patient's wife. Disposition Plan: Home. Consults called: Cardiology. Admission status: Inpatient.   Eduard Clos MD [facility] Pager [phone].  If 7PM-7AM, please contact night-coverage [email or web address] Password [email or web address]  01/16/2022, 1:40 AM

## 2022-01-16 NOTE — ED Notes (Signed)
Admitting provider made aware of patient's spo2 steadily remaining at 83% on 6 liters via nasal cannula. Respiratory called at this time to come evaluate patient as well. RT will come to bedside.  ?

## 2022-01-16 NOTE — Plan of Care (Signed)
°  Problem: Education: °Goal: Ability to demonstrate management of disease process will improve °Outcome: Progressing °Goal: Ability to verbalize understanding of medication therapies will improve °Outcome: Progressing °Goal: Individualized Educational Video(s) °Outcome: Progressing °  °

## 2022-01-16 NOTE — Progress Notes (Signed)
RT and RN transported patient to 5N32 without event. ?

## 2022-01-16 NOTE — ED Notes (Signed)
Admitting provider at bedside at this time.

## 2022-01-16 NOTE — Progress Notes (Signed)
Heart Failure Navigator Progress Note ? ?Assessed for Heart & Vascular TOC clinic readiness.  ?Patient does not meet criteria due to being seen by [facility] Heart Failure team. .  ? ? ? ?Earnestine Leys, BSN, RN ?Heart Failure Nurse Navigator ?Secure Chat Only   ?

## 2022-01-16 NOTE — Progress Notes (Signed)
ANTICOAGULATION CONSULT NOTE ? ?Pharmacy Consult for heparin ?Indication:  r/o ACS ? ?Allergies  ?Allergen Reactions  ? Trulicity [Dulaglutide]   ?  Upset his stomach  ? ? ?Patient Measurements: ?Height: 5\' 7"  (170.2 cm) ?Weight: 80.4 kg (177 lb 4 oz) ?IBW/kg (Calculated) : 66.1 ? ?Vital Signs: ?Temp: 97.6 ?F (36.4 ?C) (04/28 1144) ?Temp Source: Oral (04/28 1144) ?BP: 101/62 (04/28 1400) ?Pulse Rate: 83 (04/28 1400) ? ?Labs: ?Recent Labs  ?  01/15/22 ?2304 01/16/22 ?0215 01/16/22 ?0258 01/16/22 ?0501 01/16/22 ?1321  ?HGB 11.2* 8.8* 10.1*  --   --   ?HCT 33.4* 26.0* 29.8*  --   --   ?PLT 185  --  174  --   --   ?HEPARINUNFRC  --   --   --   --  <0.10*  ?CREATININE 3.79*  --  3.93* 4.29* 4.49*  ?TROPONINIHS  --   --  [id number]* 90,240*  --   ? ? ? ?Estimated Creatinine Clearance: 16.7 mL/min (A) (by C-G formula based on SCr of 4.49 mg/dL (H)). ? ? ?Medical History: ?Past Medical History:  ?Diagnosis Date  ? Acute renal failure (ARF) ([place]) 02/19/2015  ? CHF (congestive heart failure) ([place])   ? Coronary artery disease   ? GERD (gastroesophageal reflux disease)   ? HLD (hyperlipidemia)   ? Hypertension   ? Myocardial infarction [facility]) 01/22/2015  ? "massive"  ? Refusal of blood transfusions as patient is Jehovah's Witness   ? Seizures ([place]) 01/22/2015  ? "in front of paramedics"  ? Stroke [facility]) 01/22/2015  ? "short term memory loss & right side weak since" (02/19/2015)  ? Type II diabetes mellitus ([place])   ? ? ?Assessment: ?66yo male c/o SOB/cough x2d, O2 sat found to be mid-70s on RA, improved with 4L [place], admitted for acute CHF, now w/ troponin resulting >11,000 >> to begin heparin for possible ACS. ? ?Initial heparin level undetectable on 1000 units/hr. No bleeding or IV issues noted. Hgb stable overnight at 10.1, plt within normal limits.  ? ?Goal of Therapy:  ?Heparin level 0.3-0.7 units/ml ?Monitor platelets by anticoagulation protocol: Yes ?  ?Plan:  ?Rebolus heparin 2000 units then increase infusion to 1300 units/hr ?Recheck  heparin level in 8 hours ? ?Erin Hearing PharmD., BCPS ?Clinical Pharmacist ?01/16/2022 2:42 PM ? ?

## 2022-01-16 NOTE — Progress Notes (Signed)
ANTICOAGULATION CONSULT NOTE - Initial Consult ? ?Pharmacy Consult for heparin ?Indication:  r/o ACS ? ?Allergies  ?Allergen Reactions  ? Trulicity [Dulaglutide]   ?  Upset his stomach  ? ? ?Patient Measurements: ?Height: 5\' 7"  (170.2 cm) ?Weight: 68.9 kg (152 lb) ?IBW/kg (Calculated) : 66.1 ? ?Vital Signs: ?Temp: 98.4 ?F (36.9 ?C) (04/28 0246) ?Temp Source: Oral (04/28 0246) ?BP: 99/65 (04/28 0303) ?Pulse Rate: 87 (04/28 0303) ? ?Labs: ?Recent Labs  ?  01/15/22 ?2304 01/16/22 ?0215 01/16/22 ?0258  ?HGB 11.2* 8.8* 10.1*  ?HCT 33.4* 26.0* 29.8*  ?PLT 185  --  174  ?CREATININE 3.79*  --  3.93*  ?TROPONINIHS  --   --  11,409*  ? ? ?Estimated Creatinine Clearance: 17.5 mL/min (A) (by C-G formula based on SCr of 3.93 mg/dL (H)). ? ? ?Medical History: ?Past Medical History:  ?Diagnosis Date  ? Acute renal failure (ARF) ([place]) 02/19/2015  ? CHF (congestive heart failure) ([place])   ? Coronary artery disease   ? GERD (gastroesophageal reflux disease)   ? HLD (hyperlipidemia)   ? Hypertension   ? Myocardial infarction [facility]) 01/22/2015  ? "massive"  ? Refusal of blood transfusions as patient is Jehovah's Witness   ? Seizures ([place]) 01/22/2015  ? "in front of paramedics"  ? Stroke [facility]) 01/22/2015  ? "short term memory loss & right side weak since" (02/19/2015)  ? Type II diabetes mellitus ([place])   ? ? ?Medications:  ?Medications Prior to Admission  ?Medication Sig Dispense Refill Last Dose  ? amLODipine (NORVASC) 10 MG tablet TAKE 1 TABLET (10 MG TOTAL) BY MOUTH DAILY. (Patient taking differently: Take 10 mg by mouth daily.) 90 tablet 3   ? aspirin EC 81 MG tablet Take 1 tablet (81 mg total) by mouth daily. 100 tablet 1   ? atorvastatin (LIPITOR) 40 MG tablet Take 1 tablet (40 mg total) by mouth daily at 6 pm. 90 tablet 1   ? carvedilol (COREG) 25 MG tablet TAKE 1.5 TABLETS (37.5 MG TOTAL) BY MOUTH 2 (TWO) TIMES DAILY WITH A MEAL. (Patient taking differently: Take 37.5 mg by mouth 2 (two) times daily with a meal.) 90 tablet 5   ?  furosemide (LASIX) 20 MG tablet TAKE 1 TABLET (20 MG TOTAL) BY MOUTH DAILY. (Patient taking differently: Take 20 mg by mouth daily.) 90 tablet 1   ? glucose blood (ACCU-CHEK GUIDE) test strip Use as instructed 3 times daily DX code E11.9 100 each 3   ? hydrALAZINE (APRESOLINE) 50 MG tablet TAKE 1.5 TABLETS (75 MG TOTAL) BY MOUTH 2 (TWO) TIMES DAILY. (Patient taking differently: Take 75 mg by mouth in the morning and at bedtime.) 270 tablet 2   ? insulin glargine (LANTUS SOLOSTAR) 100 UNIT/ML Solostar Pen Inject 32 Units into the skin daily. 15 mL 2   ? Insulin Pen Needle (TECHLITE PEN NEEDLES) 32G X 4 MM MISC Use as directed for insulin 100 each 5   ? Insulin Syringe-Needle U-100 (INSULIN SYRINGE .5CC/30GX1/2") 30G X 1/2" 0.5 ML MISC Check blood sugar TID & QHS 100 each 2   ? isosorbide dinitrate (ISORDIL) 20 MG tablet TAKE 2 TABLETS (40 MG TOTAL) BY MOUTH 2 (TWO) TIMES DAILY. (Patient taking differently: 40 mg 2 (two) times daily.) 360 tablet 2   ? Lancets (ACCU-CHEK SOFT TOUCH) lancets Use as instructed 100 each 12   ? Lancets (ACCU-CHEK SOFT TOUCH) lancets Use as instructed to check blood sugar 3 times daily Dx code E11.9 100 each 3   ?  lisinopril (ZESTRIL) 40 MG tablet TAKE 1 TABLET (40 MG TOTAL) BY MOUTH DAILY. 90 tablet 3   ? TRUEPLUS LANCETS 28G MISC 1 each by Does not apply route 3 (three) times daily before meals. 100 each 12   ? ?Scheduled:  ? amLODipine  10 mg Oral Daily  ? aspirin EC  81 mg Oral Daily  ? aspirin  300 mg Rectal Once  ? atorvastatin  40 mg Oral Daily  ? carvedilol  37.5 mg Oral BID WC  ? furosemide  60 mg Intravenous Q12H  ? heparin  5,000 Units Subcutaneous Q8H  ? hydrALAZINE  75 mg Oral BID  ? insulin aspart  0-9 Units Subcutaneous TID WC  ? insulin glargine-yfgn  32 Units Subcutaneous Daily  ? isosorbide dinitrate  40 mg Oral BID  ? ?Infusions:  ? azithromycin 500 mg (01/16/22 0402)  ? cefTRIAXone (ROCEPHIN)  IV 2 g (01/16/22 0254)  ? ? ?Assessment: ?66yo male c/o SOB/cough x2d, O2 sat  found to be mid-70s on RA, improved with 4L [place], admitted for acute CHF, now w/ troponin resulting >11,000 >> to begin heparin for possible ACS. ? ?Goal of Therapy:  ?Heparin level 0.3-0.7 units/ml ?Monitor platelets by anticoagulation protocol: Yes ?  ?Plan:  ?Heparin 2000 units IV bolus x1 followed by infusion at 800 units/hr. ?Monitor heparin levels and CBC. ? ?Wynona Neat, PharmD, BCPS  ?01/16/2022,4:36 AM ? ? ?

## 2022-01-16 NOTE — ED Notes (Signed)
Patient's oxygen increased to 10 liters via nasal cannula at this time.  ?

## 2022-01-16 NOTE — Progress Notes (Signed)
Pt transported from 5N32 to [place] on BIPAP without complications. ?

## 2022-01-16 NOTE — Consult Note (Addendum)
?  ?Advanced Heart Failure Team Consult Note ? ? ?Primary Physician: Ladell Pier, MD ?PCP-Cardiologist:  Dr. Aundra Dubin  ? ?Reason for Consultation: Acute Systolic Heart Failure ? Shock  ? ?HPI:   ? ?Timothy Lambert is seen today for evaluation of acute systolic heart failure ? shock at the request of Dr. Debara Pickett, Cardiology.  ? ?66 y/o male with history of prior ETOH abuse, DM, HTN, CAD s/p anterior STEMI 5/16, paroxysmal atrial fibrillation, and CVA. Patient was admitted to [facility] in 5/16 with an ETOH withdrawal seizure.  He was also noted to have anterior ST elevation and was sent for cath.  This showed diffuse disease with culprit being 100% mLAD stenosis.  He had DES to mLAD. Echo showed EF 30-35%.  Post-PCI, he had atrial fibrillation and had a CVA.  He had no residual deficits from the CVA.  He was managed w/ ASA and Brilinta.  He was thought to be a poor candidate for anticoagulation given history of ETOH abuse.  ? ?Repeat echo 8/16 showed improvement in EF to 50-55%. ? ?He was being followed in the [facility] but lost to f/u. Last OV was 10/2015.  ? ?Now admitted w/ acute CHF and suspected shock and DKA. Presented to ED overnight w/ complaints of worsening SOB, cough and increasing LEE. Denies CP. Hypoxic on arrival w/ high O2 requirements, 10L HF w/ transition to Bipap. CXR c/w CHF w/ ? PNA. COVID and Flu negative. BNP 3,400. EKG w/ lateral ST depressions and nonspecific changes in V1 and aVR. HS trop 11,409>>13,838. Glucose > 500. SCr 4  ? ?WBC 10.2 ?Hgb 8.8 ?Na 131 ?Gluc 503. Hgb A1c 14.6  ?K 5.9  ?SCr 3.93>>4.29 ?HS trop 11,409>>13,838 ?BPN 3,498 ?Mg 1.6 ?Lactic Acid 1.6  ?PCT 7.73  ? ?Admitted by IM and placed on IV Lasix w/ minimal response. On empiric abx w/ azithromycin and ceftriaxone for possible PNA. IV heparin for NSTEMI. Insulin gtt for DKA.  ? ?STAT echo in process. Remains on Bipap.  ? ?Wife reports he has not been compliant w/ meds. No recent routine f/u w/ PCP. Wife reports he still drinks  heavily, mostly beer.  ? ?He is DNR/DNI. Jehovah's witness. Will not accept blood products, per wife.  ? ? ?Review of Systems: [y] = yes, [ ]  = no  ? ?General: Weight gain [ ] ; Weight loss [ ] ; Anorexia [ ] ; Fatigue [Y ]; Fever [ ] ; Chills [ ] ; Weakness [ ]   ?Cardiac: Chest pain/pressure [ ] ; Resting SOB [ Y]; Exertional SOB [ Y]; Orthopnea [ ] ; Pedal Edema [Y ]; Palpitations [ ] ; Syncope [ ] ; Presyncope [ ] ; Paroxysmal nocturnal dyspnea[ ]   ?Pulmonary: Cough [ Y]; Wheezing[ ] ; Hemoptysis[ ] ; Sputum [ ] ; Snoring [ ]   ?GI: Vomiting[ ] ; Dysphagia[ ] ; Melena[ ] ; Hematochezia [ ] ; Heartburn[ ] ; Abdominal pain [ ] ; Constipation [ ] ; Diarrhea [ ] ; BRBPR [ ]   ?GU: Hematuria[ ] ; Dysuria [ ] ; Nocturia[ ]   ?Vascular: Pain in legs with walking [ ] ; Pain in feet with lying flat [ ] ; Non-healing sores [ ] ; Stroke [ ] ; TIA [ ] ; Slurred speech [ ] ;  ?Neuro: Headaches[ ] ; Vertigo[ ] ; Seizures[ ] ; Paresthesias[ ] ;Blurred vision [ ] ; Diplopia [ ] ; Vision changes [ ]   ?Ortho/Skin: Arthritis [ ] ; Joint pain [ ] ; Muscle pain [ ] ; Joint swelling [ ] ; Back Pain [ ] ; Rash [ ]   ?Psych: Depression[ ] ; Anxiety[ ]   ?Heme: Bleeding problems [ ] ; Clotting disorders [ ] ; Anemia [ ]   ?  Endocrine: Diabetes [Y ]; Thyroid dysfunction[ ]  ? ?Home Medications ?Prior to Admission medications   ?Medication Sig Start Date End Date Taking? Authorizing Provider  ?amLODipine (NORVASC) 10 MG tablet TAKE 1 TABLET (10 MG TOTAL) BY MOUTH DAILY. ?Patient taking differently: Take 10 mg by mouth daily. 09/05/21 09/05/22 Yes Ladell Pier, MD  ?aspirin EC 81 MG tablet Take 1 tablet (81 mg total) by mouth daily. 12/10/17  Yes Ladell Pier, MD  ?atorvastatin (LIPITOR) 40 MG tablet Take 1 tablet (40 mg total) by mouth daily at 6 pm. 09/05/21 03/29/22 Yes Ladell Pier, MD  ?carvedilol (COREG) 25 MG tablet TAKE 1.5 TABLETS (37.5 MG TOTAL) BY MOUTH 2 (TWO) TIMES DAILY WITH A MEAL. ?Patient taking differently: Take 37.5 mg by mouth 2 (two) times daily with a  meal. 06/04/21 06/04/22 Yes Ladell Pier, MD  ?furosemide (LASIX) 20 MG tablet TAKE 1 TABLET (20 MG TOTAL) BY MOUTH DAILY. ?Patient taking differently: Take 20 mg by mouth daily. 09/05/21 09/05/22 Yes Ladell Pier, MD  ?hydrALAZINE (APRESOLINE) 50 MG tablet TAKE 1.5 TABLETS (75 MG TOTAL) BY MOUTH 2 (TWO) TIMES DAILY. ?Patient taking differently: Take 75 mg by mouth in the morning and at bedtime. 02/11/21 02/11/22 Yes Ladell Pier, MD  ?insulin glargine (LANTUS SOLOSTAR) 100 UNIT/ML Solostar Pen Inject 32 Units into the skin daily. 04/11/21 01/16/22 Yes Ladell Pier, MD  ?lisinopril (ZESTRIL) 40 MG tablet TAKE 1 TABLET (40 MG TOTAL) BY MOUTH DAILY. ?Patient taking differently: Take 40 mg by mouth daily. 09/05/21 09/05/22 Yes Ladell Pier, MD  ?glucose blood (ACCU-CHEK GUIDE) test strip Use as instructed 3 times daily DX code E11.9 08/05/20   Elayne Snare, MD  ?Insulin Pen Needle (TECHLITE PEN NEEDLES) 32G X 4 MM MISC Use as directed for insulin 04/16/21 04/16/22  Ladell Pier, MD  ?Insulin Syringe-Needle U-100 (INSULIN SYRINGE .5CC/30GX1/2") 30G X 1/2" 0.5 ML MISC Check blood sugar TID & QHS 11/16/17   Veryl Speak, MD  ?isosorbide dinitrate (ISORDIL) 20 MG tablet TAKE 2 TABLETS (40 MG TOTAL) BY MOUTH 2 (TWO) TIMES DAILY. ?Patient not taking: Reported on 01/16/2022 02/11/21 02/11/22  Ladell Pier, MD  ?Lancets [facility] SOFT [facility]) lancets Use as instructed 02/15/18   Ladell Pier, MD  ?Lancets (ACCU-CHEK SOFT [facility]) lancets Use as instructed to check blood sugar 3 times daily Dx code E11.9 08/05/20   Elayne Snare, MD  ?TRUEPLUS LANCETS 28G MISC 1 each by Does not apply route 3 (three) times daily before meals. 02/15/18   Ladell Pier, MD  ? ? ?Past Medical History: ?Past Medical History:  ?Diagnosis Date  ? Acute renal failure (ARF) ([place]) 02/19/2015  ? CHF (congestive heart failure) ([place])   ? Coronary artery disease   ? GERD (gastroesophageal reflux disease)   ? HLD  (hyperlipidemia)   ? Hypertension   ? Myocardial infarction [facility]) 01/22/2015  ? "massive"  ? Refusal of blood transfusions as patient is Jehovah's Witness   ? Seizures ([place]) 01/22/2015  ? "in front of paramedics"  ? Stroke [facility]) 01/22/2015  ? "short term memory loss & right side weak since" (02/19/2015)  ? Type II diabetes mellitus ([place])   ? ? ?Past Surgical History: ?Past Surgical History:  ?Procedure Laterality Date  ? CARDIAC CATHETERIZATION N/A 01/23/2015  ? Procedure: Left Heart Cath and Coronary Angiography;  Surgeon: [name], MD;  Location: [facility] INVASIVE CV LAB CUPID;  Service: Cardiovascular;  Laterality: N/A;  ? CARDIAC CATHETERIZATION N/A 01/23/2015  ?  Procedure: Coronary Stent Intervention;  Surgeon: [name], MD;  Location: [facility] INVASIVE CV LAB CUPID;  Service: Cardiovascular;  Laterality: N/A;  ? ? ?Family History: ?Family History  ?Problem Relation Age of Onset  ? Diabetes Father   ? Seizures Neg Hx   ? ? ?Social History: ?Social History  ? ?Socioeconomic History  ? Marital status: Married  ?  Spouse name: Not on file  ? Number of children: Not on file  ? Years of education: Not on file  ? Highest education level: Not on file  ?Occupational History  ? Not on file  ?Tobacco Use  ? Smoking status: Former  ?  Packs/day: 0.33  ?  Years: 14.00  ?  Pack years: 4.62  ?  Types: Cigarettes  ? Smokeless tobacco: Never  ? Tobacco comments:  ?  "quit smoking cigarettes in 1992"  ?Substance and Sexual Activity  ? Alcohol use: No  ?  Comment: 02/19/2015 "stopped drinking 01/22/2015"  ? Drug use: No  ? Sexual activity: Not Currently  ?Other Topics Concern  ? Not on file  ?Social History Narrative  ? Lives  ? Caffeine use: Coffee sometimes  ? ?Social Determinants of Health  ? ?Financial Resource Strain: Not on file  ?Food Insecurity: Not on file  ?Transportation Needs: Not on file  ?Physical Activity: Not on file  ?Stress: Not on file  ?Social Connections: Not on file  ? ? ?Allergies:  ?Allergies  ?Allergen Reactions  ?  Trulicity [Dulaglutide]   ?  Upset his stomach  ? ? ?Objective:   ? ?Vital Signs:   ?Temp:  [98.4 ?F (36.9 ?C)-98.6 ?F (37 ?C)] 98.5 ?F (36.9 ?C) (04/28 0600) ?Pulse Rate:  [80-99] 84 (04/28 0838) ?Resp:  [14-38] 2

## 2022-01-16 NOTE — ED Notes (Signed)
Patient placed on salter with oxygen at this time. Oxygen increased to 8 liters. Spo2 at 87%.  ?

## 2022-01-16 NOTE — ED Notes (Signed)
RT at bedside to place patient on bi-pap at this time. 

## 2022-01-16 NOTE — Consult Note (Signed)
? ?CONSULTATION NOTE  ? ?Patient Name: Timothy Lambert ?Date of Encounter: 01/16/2022 ?Cardiologist: None ?Electrophysiologist: None ?Advanced Heart Failure: None ? ? ?Chief Complaint  ? ?Shortness of breath ? ?Patient Profile  ? ?66 yo male with history of anteior MI and ischemic cardiomyopathy with LVEF 30-35% in 2016, now presents with acute cough and shortness of breath.  ? ?HPI  ? ?Timothy Lambert is a 66 y.o. male who is being seen today for the evaluation of CHF at the request of Dr. Bonner Puna. This is a 66 year old male with history of type 2 diabetes, prior alcohol abuse, hypertension and coronary artery disease status post anterior STEMI in May 2016 with residual significant disease, PAF and prior stroke who had a history of alcohol withdrawal seizure.  Cardiac catheterization in 2016 showed 100% mid LAD stenosis and he received a drug-eluting stent.  Echo at that time showed an LVEF 30 to 35% and he was seen by the heart failure clinic.  His LVEF subsequently improved to 50 to 55% in August 2016 with mild mid to distal anteroseptal hypokinesis.  It appears he has been lost to cardiology follow-up.  He gets primary care through [facility].  Now presents with 1 week of progressive shortness of breath and cough.  He had reported worsening lower extremity edema.  Diabetes apparently is poorly controlled with hemoglobin A1c of 14.  His wife apparently reported he had a positive COVID test however his COVID and flu PCR here were negative.  Initially he required high flow oxygen.  BNP was markedly elevated at 3400.  Initial troponin was elevated greater than 11,000 and rose to greater than 13,000.  EKG showed ST depression in the lateral leads and he was given IV Lasix and BiPAP.  Overnight he has had issues with hypotension.  He has baseline at least stage III chronic kidney disease.  Initial creatinine was 3.0 on April 11 and has wrote risen to 4.29 today.  Sodium is trending down to 29  today with an elevated potassium at 5.6.  Lactate is 1.6 with a procalcitonin of 7.73.  Mild AST and ALT elevations with elevated bilirubin and low albumin at 3.0.  Magnesium was 1.6 today.  EKG shows sinus rhythm.  Hemoglobin is 10.1.  A1c 14.6. ? ?PMHx  ? ?Past Medical History:  ?Diagnosis Date  ? Acute renal failure (ARF) ([place]) 02/19/2015  ? CHF (congestive heart failure) ([place])   ? Coronary artery disease   ? GERD (gastroesophageal reflux disease)   ? HLD (hyperlipidemia)   ? Hypertension   ? Myocardial infarction [facility]) 01/22/2015  ? "massive"  ? Refusal of blood transfusions as patient is Jehovah's Witness   ? Seizures ([place]) 01/22/2015  ? "in front of paramedics"  ? Stroke [facility]) 01/22/2015  ? "short term memory loss & right side weak since" (02/19/2015)  ? Type II diabetes mellitus ([place])   ? ? ?Past Surgical History:  ?Procedure Laterality Date  ? CARDIAC CATHETERIZATION N/A 01/23/2015  ? Procedure: Left Heart Cath and Coronary Angiography;  Surgeon: [name], MD;  Location: [facility] INVASIVE CV LAB CUPID;  Service: Cardiovascular;  Laterality: N/A;  ? CARDIAC CATHETERIZATION N/A 01/23/2015  ? Procedure: Coronary Stent Intervention;  Surgeon: [name], MD;  Location: [facility] INVASIVE CV LAB CUPID;  Service: Cardiovascular;  Laterality: N/A;  ? ? ?FAMHx  ? ?Family History  ?Problem Relation Age of Onset  ? Diabetes Father   ? Seizures Neg Hx   ? ? ?SOCHx  ? ?  reports that he has quit smoking. His smoking use included cigarettes. He has a 4.62 pack-year smoking history. He has never used smokeless tobacco. He reports that he does not drink alcohol and does not use drugs. ? ?Outpatient Medications  ? ?No current facility-administered medications on file prior to encounter.  ? ?Current Outpatient Medications on File Prior to Encounter  ?Medication Sig Dispense Refill  ? amLODipine (NORVASC) 10 MG tablet TAKE 1 TABLET (10 MG TOTAL) BY MOUTH DAILY. (Patient taking differently: Take 10 mg by mouth daily.) 90 tablet 3  ? aspirin  EC 81 MG tablet Take 1 tablet (81 mg total) by mouth daily. 100 tablet 1  ? atorvastatin (LIPITOR) 40 MG tablet Take 1 tablet (40 mg total) by mouth daily at 6 pm. 90 tablet 1  ? carvedilol (COREG) 25 MG tablet TAKE 1.5 TABLETS (37.5 MG TOTAL) BY MOUTH 2 (TWO) TIMES DAILY WITH A MEAL. (Patient taking differently: Take 37.5 mg by mouth 2 (two) times daily with a meal.) 90 tablet 5  ? furosemide (LASIX) 20 MG tablet TAKE 1 TABLET (20 MG TOTAL) BY MOUTH DAILY. (Patient taking differently: Take 20 mg by mouth daily.) 90 tablet 1  ? hydrALAZINE (APRESOLINE) 50 MG tablet TAKE 1.5 TABLETS (75 MG TOTAL) BY MOUTH 2 (TWO) TIMES DAILY. (Patient taking differently: Take 75 mg by mouth in the morning and at bedtime.) 270 tablet 2  ? insulin glargine (LANTUS SOLOSTAR) 100 UNIT/ML Solostar Pen Inject 32 Units into the skin daily. 15 mL 2  ? lisinopril (ZESTRIL) 40 MG tablet TAKE 1 TABLET (40 MG TOTAL) BY MOUTH DAILY. (Patient taking differently: Take 40 mg by mouth daily.) 90 tablet 3  ? glucose blood (ACCU-CHEK GUIDE) test strip Use as instructed 3 times daily DX code E11.9 100 each 3  ? Insulin Pen Needle (TECHLITE PEN NEEDLES) 32G X 4 MM MISC Use as directed for insulin 100 each 5  ? Insulin Syringe-Needle U-100 (INSULIN SYRINGE .5CC/30GX1/2") 30G X 1/2" 0.5 ML MISC Check blood sugar TID & QHS 100 each 2  ? isosorbide dinitrate (ISORDIL) 20 MG tablet TAKE 2 TABLETS (40 MG TOTAL) BY MOUTH 2 (TWO) TIMES DAILY. (Patient not taking: Reported on 01/16/2022) 360 tablet 2  ? Lancets (ACCU-CHEK SOFT TOUCH) lancets Use as instructed 100 each 12  ? Lancets (ACCU-CHEK SOFT TOUCH) lancets Use as instructed to check blood sugar 3 times daily Dx code E11.9 100 each 3  ? TRUEPLUS LANCETS 28G MISC 1 each by Does not apply route 3 (three) times daily before meals. 100 each 12  ? ? ?Inpatient Medications  ?  ?Scheduled Meds: ? aspirin EC  81 mg Oral Daily  ? atorvastatin  40 mg Oral Daily  ? insulin glargine-yfgn  32 Units Subcutaneous Daily   ? ? ?Continuous Infusions: ? azithromycin 500 mg (01/16/22 0402)  ? cefTRIAXone (ROCEPHIN)  IV 2 g (01/16/22 0254)  ? heparin 1,000 Units/hr (01/16/22 0536)  ? insulin 11 Units/hr (01/16/22 0827)  ? ? ?PRN Meds: ?acetaminophen **OR** acetaminophen, dextrose  ? ?ALLERGIES  ? ?Allergies  ?Allergen Reactions  ? Trulicity [Dulaglutide]   ?  Upset his stomach  ? ? ?ROS  ? ?Pertinent items noted in HPI and remainder of comprehensive ROS otherwise negative. ? ?Vitals  ? ?Vitals:  ? 01/16/22 0723 01/16/22 9381 01/16/22 0830 01/16/22 0838  ?BP: 100/69   109/80  ?Pulse: 81   84  ?Resp: 18  (!) 23 20  ?Temp:      ?TempSrc:      ?  SpO2: 98%   100%  ?Weight:  80.4 kg    ?Height:      ? ?No intake or output data in the 24 hours ending 01/16/22 0840 ?Filed Weights  ? 01/15/22 2226 01/16/22 0822  ?Weight: 68.9 kg 80.4 kg  ? ? ?Physical Exam  ? ?General appearance: alert, mild distress, and on bipap, lying supine ?Neck: JVD - several cm above sternal notch, no carotid bruit, and thyroid not enlarged, symmetric, no tenderness/mass/nodules ?Lungs: diminished breath sounds bilaterally and rales bibasilar ?Heart: regular rate and rhythm ?Abdomen: soft, non-tender; bowel sounds normal; no masses,  no organomegaly ?Extremities: extremities normal, atraumatic, no cyanosis or edema ?Pulses: 2+ and symmetric ?Skin: Skin color, texture, turgor normal. No rashes or lesions ?Neurologic: Grossly normal ?Psych: Pleasant ? ?Labs  ? ?Results for orders placed or performed during the hospital encounter of 01/15/22 (from the past 48 hour(s))  ?CBC     Status: Abnormal  ? Collection Time: 01/15/22 11:04 PM  ?Result Value Ref Range  ? WBC 13.7 (H) 4.0 - 10.5 K/uL  ? RBC 3.78 (L) 4.22 - 5.81 MIL/uL  ? Hemoglobin 11.2 (L) 13.0 - 17.0 g/dL  ? HCT 33.4 (L) 39.0 - 52.0 %  ? MCV 88.4 80.0 - 100.0 fL  ? MCH 29.6 26.0 - 34.0 pg  ? MCHC 33.5 30.0 - 36.0 g/dL  ? RDW 13.4 11.5 - 15.5 %  ? Platelets 185 150 - 400 K/uL  ? nRBC 0.0 0.0 - 0.2 %  ?  Comment: Performed  at [facility], [place] [address], [place], [place] [address]  ?Basic metabolic panel     Status: Abnormal  ? Collection Time: 01/15/22 11:04 PM  ?Result Value Ref Range  ? Sodium 134 (L) 135 - 145 mmol/L

## 2022-01-16 NOTE — Consult Note (Addendum)
? ?NAME:  Timothy Lambert, MRN:  [id number], DOB:  1956/09/20, LOS: 0 ?ADMISSION DATE:  01/15/2022, CONSULTATION DATE:  01/16/22 ?REFERRING MD:  Bonner Puna - [facility], CHIEF COMPLAINT:  Cardiogenic shock   ? ?History of Present Illness:  ?66 yo M PMH DM CAD, prior anterior STEMI, s/p DES mLAD, ICM, Afib, CVA, etoh abuse who was admitted to [facility] 4/28 with CC SOB and management of acute respiratory failure with hypoxia, DKA, AKI on CKD III. In interval this morning, there is concern for cardiogenic shock. Cardiology, Adv HF, Nephrology have been consulted and it has been requested for patient to transfer to ICU for further management of suspected cardiogenic shock after multiple low BP readings 0500 and 0600 4/28  ? ?The patient is DNR/I, and is of note a Raymond Gurney witness  ? ?He has not had IVF for his DKA including during ED course due to concern for acute HF ? ?PCCM consulted in this setting  ? ?Pertinent  Medical History  ?CAD ?STEMI ?CVA ?Afib ?ICM ?Etoh abuse  ?CKD III ? ?Significant Hospital Events: ?Including procedures, antibiotic start and stop dates in addition to other pertinent events   ?4/28 Admit to [facility] -- abx, insulin gtt, bipap. Cards, nephro, HF, PCCM consult ? ?Interim History / Subjective:  ?Transferring to ICU for further management of HF / suspected shock  ? ?Objective   ?Blood pressure 109/80, pulse 84, temperature 98.5 ?F (36.9 ?C), temperature source Axillary, resp. rate 20, height 5\' 7"  (1.702 m), weight 80.4 kg, SpO2 100 %. ?   ?FiO2 (%):  [60 %] 60 %  ?No intake or output data in the 24 hours ending 01/16/22 0943 ?Filed Weights  ? 01/15/22 2226 01/16/22 0822  ?Weight: 68.9 kg 80.4 kg  ? ? ?Examination: ?General: WDWN middle aged M NAD on BiPAP  ?HENT: NCAT BiPAP mask innplace with intermittent leak  ?Lungs: BiPAP supported. Symmetrical chest expansion. Few Basilar Rales  ?Cardiovascular: rrr s1s2 cap refill < 3 sec. Warm ?Abdomen: soft protuberant ndnt  ?Extremities: No acute joint deformity  ?Neuro:  AAOx 3 following commands  ?GU: wnl ? ?Resolved Hospital Problem list   ? ? ?Assessment & Plan:  ? ?Acute respiratory failure with hypoxia ?Hx COPD ?-pulm edema, ?PNA ?-says breathing feels fine ?P ?-BiPAP is highest level of care for this pt who is DNR/I ?-will transition to High flow  ?-cont CAP coverage  ? ?Acute systolic hearty failure   ?NSTEMI ?CAD ?Hx ICM ?Hx STEMI s/p DES  ?P ?-PCCM was asked to place CVC -- he is very clear about declining CVC placement, including PICC placement.  ?-not a candidate for advanced therapies per HF  ?-on hep gtt  ?-Follow ECHO  ? ?Hypotension, improved ?-there was concern for possible cardiogenic shock given NSTEMI, though query if pt is intravascularly hypovolemic with DKA, without volume resuscitation ?P ?-will d/w HF  ? ?AKI on CKD IIIb  ?P ?-nephrology consulted  ? ?DKA ?P ?-insulin gtt. Has not had abundance of volume  ?-will give 500 ml bolus of LR  ? ?Hx pAF -- now sinus ?P ?-on hep gtt ?-lyte optimization  ? ?Hx Etoh abuse ?P  ?-supportive care  ? ?Hyperkalemia ?Hypomagnesemia ?Pseudohyponatremia  ?P ?-trend, replace as needed  ? ?Jehovahs Witness ?- no blood product, no albumin -- confirmed with patient 01/16/22 ? ?DNR status ?-DNR/I ?-does not wan central line or PICC ? ?Best Practice (right click and "Reselect all SmartList Selections" daily)  ? ?Diet/type: clear liquids ?DVT prophylaxis: systemic heparin ?  GI prophylaxis: N/A ?Lines: N/A ?Foley:  N/A ?Code Status:  DNR ?Last date of multidisciplinary goals of care discussion [--] ? ?Labs   ?CBC: ?Recent Labs  ?Lab 01/15/22 ?2304 01/16/22 ?0215 01/16/22 ?2947  ?WBC 13.7*  --  10.2  ?HGB 11.2* 8.8* 10.1*  ?HCT 33.4* 26.0* 29.8*  ?MCV 88.4  --  87.6  ?PLT 185  --  174  ? ? ?Basic Metabolic Panel: ?Recent Labs  ?Lab 01/15/22 ?2304 01/16/22 ?0215 01/16/22 ?0258 01/16/22 ?0501  ?NA 134* 131*  --  129*  ?K 5.4* 5.9*  --  5.6*  ?CL 104  --   --  101  ?CO2 17*  --   --  13*  ?GLUCOSE 422*  --   --  479*  ?BUN 61*  --   --  70*   ?CREATININE 3.79*  --  3.93* 4.29*  ?CALCIUM 8.7*  --   --  8.4*  ?MG  --   --  1.6*  --   ? ?GFR: ?Estimated Creatinine Clearance: 17.4 mL/min (A) (by C-G formula based on SCr of 4.29 mg/dL (H)). ?Recent Labs  ?Lab 01/15/22 ?2304 01/16/22 ?0258 01/16/22 ?0501 01/16/22 ?0720  ?PROCALCITON  --   --  7.73  --   ?WBC 13.7* 10.2  --   --   ?LATICACIDVEN  --   --   --  1.6  ? ? ?Liver Function Tests: ?Recent Labs  ?Lab 01/16/22 ?0501  ?AST 58*  ?ALT 65*  ?ALKPHOS 46  ?BILITOT 1.4*  ?PROT 6.0*  ?ALBUMIN 3.0*  ? ?No results for input(s): LIPASE, AMYLASE in the last 168 hours. ?No results for input(s): AMMONIA in the last 168 hours. ? ?ABG ?   ?Component Value Date/Time  ? PHART 7.382 01/16/2022 0215  ? PCO2ART 26.7 (L) 01/16/2022 0215  ? PO2ART 70 (L) 01/16/2022 0215  ? HCO3 15.9 (L) 01/16/2022 0215  ? TCO2 17 (L) 01/16/2022 0215  ? ACIDBASEDEF 8.0 (H) 01/16/2022 0215  ? O2SAT 94 01/16/2022 0215  ?  ? ?Coagulation Profile: ?No results for input(s): INR, PROTIME in the last 168 hours. ? ?Cardiac Enzymes: ?No results for input(s): CKTOTAL, CKMB, CKMBINDEX, TROPONINI in the last 168 hours. ? ?HbA1C: ?HbA1c, POC (controlled diabetic range)  ?Date/Time Value Ref Range Status  ?09/05/2021 10:34 AM 7.1 (A) 0.0 - 7.0 % Final  ?02/11/2021 02:29 PM 6.9 0.0 - 7.0 % Final  ? ?Hgb A1c MFr Bld  ?Date/Time Value Ref Range Status  ?12/30/2021 01:49 PM 14.6 (H) 4.8 - 5.6 % Final  ?  Comment:  ?  **Verified by repeat analysis** ?         Prediabetes: 5.7 - 6.4 ?         Diabetes: >6.4 ?         Glycemic control for adults with diabetes: <7.0 ?  ? ? ?CBG: ?Recent Labs  ?Lab 01/16/22 ?6546 01/16/22 ?5035 01/16/22 ?0912 01/16/22 ?0930  ?GLUCAP [place] ?Review of Systems:   ?Review of Systems  ?Constitutional: Negative.   ?HENT: Negative.    ?Eyes: Negative.   ?Respiratory:  Positive for shortness of breath.   ?Cardiovascular: Negative.   ?Gastrointestinal: Negative.   ?Genitourinary: Negative.   ?Musculoskeletal: Negative.    ?Skin: Negative.   ?Neurological: Negative.   ?Endo/Heme/Allergies: Negative.   ?Psychiatric/Behavioral: Negative.    ? ? ?Past Medical History:  ?He,  has a past medical history of Acute renal failure (ARF) ([place]) (02/19/2015), CHF (congestive heart  failure) ([place]), Coronary artery disease, GERD (gastroesophageal reflux disease), HLD (hyperlipidemia), Hypertension, Myocardial infarction ([place]) (01/22/2015), Refusal of blood transfusions as patient is Jehovah's Witness, Seizures ([place]) (01/22/2015), Stroke ([place]) (01/22/2015), and Type II diabetes mellitus ([place]).  ? ?Surgical History:  ? ?Past Surgical History:  ?Procedure Laterality Date  ? CARDIAC CATHETERIZATION N/A 01/23/2015  ? Procedure: Left Heart Cath and Coronary Angiography;  Surgeon: [name], MD;  Location: [facility] INVASIVE CV LAB CUPID;  Service: Cardiovascular;  Laterality: N/A;  ? CARDIAC CATHETERIZATION N/A 01/23/2015  ? Procedure: Coronary Stent Intervention;  Surgeon: [name], MD;  Location: [facility] INVASIVE CV LAB CUPID;  Service: Cardiovascular;  Laterality: N/A;  ?  ? ?Social History:  ? reports that he has quit smoking. His smoking use included cigarettes. He has a 4.62 pack-year smoking history. He has never used smokeless tobacco. He reports that he does not drink alcohol and does not use drugs.  ? ?Family History:  ?His family history includes Diabetes in his father. There is no history of Seizures.  ? ?Allergies ?Allergies  ?Allergen Reactions  ? Trulicity [Dulaglutide]   ?  Upset his stomach  ?  ? ?Home Medications  ?Prior to Admission medications   ?Medication Sig Start Date End Date Taking? Authorizing Provider  ?amLODipine (NORVASC) 10 MG tablet TAKE 1 TABLET (10 MG TOTAL) BY MOUTH DAILY. ?Patient taking differently: Take 10 mg by mouth daily. 09/05/21 09/05/22 Yes Ladell Pier, MD  ?aspirin EC 81 MG tablet Take 1 tablet (81 mg total) by mouth daily. 12/10/17  Yes Ladell Pier, MD  ?atorvastatin (LIPITOR) 40 MG tablet Take 1 tablet (40  mg total) by mouth daily at 6 pm. 09/05/21 03/29/22 Yes Ladell Pier, MD  ?carvedilol (COREG) 25 MG tablet TAKE 1.5 TABLETS (37.5 MG TOTAL) BY MOUTH 2 (TWO) TIMES DAILY WITH A MEAL. ?Patient taking di

## 2022-01-16 NOTE — Progress Notes (Signed)
Echocardiogram ?2D Echocardiogram has been performed. ? ?Arlyss Gandy ?01/16/2022, 9:23 AM ?

## 2022-01-16 NOTE — ED Notes (Signed)
Admitting provider Hal Hope instructed this RN to administer ordered lasix in regards to patient's low spo2. Lasix given at this time.  ?

## 2022-01-16 NOTE — Progress Notes (Addendum)
Initial Nutrition Assessment ? ?DOCUMENTATION CODES:  ? ?Not applicable ? ?INTERVENTION:  ? ?Pro-Source Plus 75m po TID- Each supplement provides 100kcal and 15g protein   ? ?Ensure Max protein supplement BID with diet advancement, each supplement provides 150kcal and 30g of protein. ? ?MVI po daily  ? ?Pt at high refeed risk; recommend monitor potassium, magnesium and phosphorus labs daily until stable ? ?NUTRITION DIAGNOSIS:  ? ?Inadequate oral intake related to acute illness as evidenced by per patient/family report. ? ?GOAL:  ? ?Patient will meet greater than or equal to 90% of their needs ? ?MONITOR:  ? ?PO intake, Supplement acceptance, Labs, Weight trends, Skin, I & O's ? ?REASON FOR ASSESSMENT:  ? ?Malnutrition Screening Tool ?  ? ?ASSESSMENT:  ? ?66y/o male with h/o CHF, COPD, MI, seizures, CVA, DM, PAF, CAD, HLD, STEMI, GERD and CKD III who is admitted with possible CHF, AKI and DKA. ? ?Met with pt in room today. Pt reports poor appetite and oral intake for several days pta. Pt reports that at baseline, he does not always eat breakfast but usually eats two other meals during the day. Pt reports that he is willing to eat today but mainly he is thirsty; pt drinking water at time of RD visit. Pt initiated on a clear liquid diet this morning. RD discussed with pt the importance of adequate nutrition needed to preserve lean muscle. Pt is willing to drink chocolate supplements with diet advancement. RD will add supplements and MVI to help pt meet his estimated needs. Pt is at high refeed risk. Per chart, pt appears weight stable at baseline.  ? ?Medications reviewed and include: aspirin, azithromycin, ceftriaxone, heparin, insulin, LRS bolus 5051m? ?Labs reviewed: Na 129(L), K 5.6(H), BUN 70(H), creat 4.29(H), Mg 1.6(L) ?Hgb 10.1(L), Hct 29.8(L) ? Cbgs- 461, 482, 476, 483, 503 x 24 hrs ?AIC 7.1(H)- 08/2021 ? ?NUTRITION - FOCUSED PHYSICAL EXAM: ? ?Flowsheet Row Most Recent Value  ?Orbital Region No depletion   ?Upper Arm Region No depletion  ?Thoracic and Lumbar Region No depletion  ?Buccal Region No depletion  ?Temple Region No depletion  ?Clavicle Bone Region Mild depletion  ?Clavicle and Acromion Bone Region Mild depletion  ?Scapular Bone Region No depletion  ?Dorsal Hand No depletion  ?Patellar Region Mild depletion  ?Anterior Thigh Region No depletion  ?Posterior Calf Region No depletion  ?Edema (RD Assessment) None  ?Hair Reviewed  ?Eyes Reviewed  ?Mouth Reviewed  ?Skin Reviewed  ?Nails Reviewed  ? ?Diet Order:   ?Diet Order   ? ?       ?  Diet clear liquid Room service appropriate? Yes; Fluid consistency: Thin  Diet effective now       ?  ? ?  ?  ? ?  ? ?EDUCATION NEEDS:  ? ?Education needs have been addressed ? ?Skin:  Skin Assessment: Reviewed RN Assessment ? ?Last BM:  4/28- TYPE 1 ? ?Height:  ? ?Ht Readings from Last 1 Encounters:  ?01/15/22 _0  (1.702 m)  ? ? ?Weight:  ? ?Wt Readings from Last 1 Encounters:  ?01/16/22 80.4 kg  ? ? ?Ideal Body Weight:  67.2 kg ? ?BMI:  Body mass index is 27.76 kg/m?. ? ?Estimated Nutritional Needs:  ? ?Kcal:  1800-2100kcal/day ? ?Protein:  90-105g/day ? ?Fluid:  1.7-2.0L/day ? ?CaKoleen DistanceS, RD, LDN ?Please refer to AMION for RD and/or RD on-call/weekend/after hours pager ? ?

## 2022-01-17 DIAGNOSIS — Z515 Encounter for palliative care: Secondary | ICD-10-CM

## 2022-01-17 DIAGNOSIS — I214 Non-ST elevation (NSTEMI) myocardial infarction: Secondary | ICD-10-CM

## 2022-01-17 DIAGNOSIS — Z7189 Other specified counseling: Secondary | ICD-10-CM

## 2022-01-17 DIAGNOSIS — J9601 Acute respiratory failure with hypoxia: Secondary | ICD-10-CM | POA: Diagnosis not present

## 2022-01-17 DIAGNOSIS — N179 Acute kidney failure, unspecified: Secondary | ICD-10-CM | POA: Diagnosis not present

## 2022-01-17 DIAGNOSIS — I5023 Acute on chronic systolic (congestive) heart failure: Secondary | ICD-10-CM | POA: Diagnosis not present

## 2022-01-17 DIAGNOSIS — Z66 Do not resuscitate: Secondary | ICD-10-CM

## 2022-01-17 LAB — BASIC METABOLIC PANEL
Anion gap: 12 (ref 5–15)
Anion gap: 13 (ref 5–15)
BUN: 89 mg/dL — ABNORMAL HIGH (ref 8–23)
BUN: 94 mg/dL — ABNORMAL HIGH (ref 8–23)
CO2: 18 mmol/L — ABNORMAL LOW (ref 22–32)
CO2: 18 mmol/L — ABNORMAL LOW (ref 22–32)
Calcium: 8.7 mg/dL — ABNORMAL LOW (ref 8.9–10.3)
Calcium: 8.7 mg/dL — ABNORMAL LOW (ref 8.9–10.3)
Chloride: 104 mmol/L (ref 98–111)
Chloride: 104 mmol/L (ref 98–111)
Creatinine, Ser: 4.45 mg/dL — ABNORMAL HIGH (ref 0.61–1.24)
Creatinine, Ser: 4.49 mg/dL — ABNORMAL HIGH (ref 0.61–1.24)
GFR, Estimated: 14 mL/min — ABNORMAL LOW (ref >60)
GFR, Estimated: 14 mL/min — ABNORMAL LOW (ref >60)
Glucose, Bld: 192 mg/dL — ABNORMAL HIGH (ref 70–99)
Glucose, Bld: 235 mg/dL — ABNORMAL HIGH (ref 70–99)
Potassium: 4.7 mmol/L (ref 3.5–5.1)
Potassium: 4.8 mmol/L (ref 3.5–5.1)
Sodium: 134 mmol/L — ABNORMAL LOW (ref 135–145)
Sodium: 135 mmol/L (ref 135–145)

## 2022-01-17 LAB — URINALYSIS, ROUTINE W REFLEX MICROSCOPIC
Bilirubin Urine: NEGATIVE
Glucose, UA: 150 mg/dL — AB
Hgb urine dipstick: NEGATIVE
Ketones, ur: NEGATIVE mg/dL
Leukocytes,Ua: NEGATIVE
Nitrite: NEGATIVE
Protein, ur: 30 mg/dL — AB
Specific Gravity, Urine: 1.012 (ref 1.005–1.030)
pH: 5 (ref 5.0–8.0)

## 2022-01-17 LAB — MAGNESIUM
Magnesium: 1.8 mg/dL (ref 1.7–2.4)
Magnesium: 1.8 mg/dL (ref 1.7–2.4)

## 2022-01-17 LAB — HEPARIN LEVEL (UNFRACTIONATED)
Heparin Unfractionated: 0.35 IU/mL (ref 0.30–0.70)
Heparin Unfractionated: 0.49 IU/mL (ref 0.30–0.70)

## 2022-01-17 LAB — CBC
HCT: 26.9 % — ABNORMAL LOW (ref 39.0–52.0)
Hemoglobin: 9.4 g/dL — ABNORMAL LOW (ref 13.0–17.0)
MCH: 30 pg (ref 26.0–34.0)
MCHC: 34.9 g/dL (ref 30.0–36.0)
MCV: 85.9 fL (ref 80.0–100.0)
Platelets: 184 10*3/uL (ref 150–400)
RBC: 3.13 MIL/uL — ABNORMAL LOW (ref 4.22–5.81)
RDW: 13.5 % (ref 11.5–15.5)
WBC: 14.8 10*3/uL — ABNORMAL HIGH (ref 4.0–10.5)
nRBC: 0 % (ref 0.0–0.2)

## 2022-01-17 LAB — LIPID PANEL
Cholesterol: 112 mg/dL (ref 0–200)
HDL: 39 mg/dL — ABNORMAL LOW (ref >40)
LDL Cholesterol: 61 mg/dL (ref 0–99)
Total CHOL/HDL Ratio: 2.9 RATIO
Triglycerides: 62 mg/dL (ref <150)
VLDL: 12 mg/dL (ref 0–40)

## 2022-01-17 LAB — SODIUM, URINE, RANDOM: Sodium, Ur: 32 mmol/L

## 2022-01-17 LAB — PROTEIN / CREATININE RATIO, URINE
Creatinine, Urine: 141.77 mg/dL
Protein Creatinine Ratio: 0.38 mg/mg{Cre} — ABNORMAL HIGH (ref 0.00–0.15)
Total Protein, Urine: 54 mg/dL

## 2022-01-17 LAB — BETA-HYDROXYBUTYRIC ACID: Beta-Hydroxybutyric Acid: 0.57 mmol/L — ABNORMAL HIGH (ref 0.05–0.27)

## 2022-01-17 LAB — GLUCOSE, CAPILLARY
Glucose-Capillary: 177 mg/dL — ABNORMAL HIGH (ref 70–99)
Glucose-Capillary: 162 mg/dL — ABNORMAL HIGH (ref 70–99)
Glucose-Capillary: 234 mg/dL — ABNORMAL HIGH (ref 70–99)
Glucose-Capillary: 258 mg/dL — ABNORMAL HIGH (ref 70–99)
Glucose-Capillary: 207 mg/dL — ABNORMAL HIGH (ref 70–99)
Glucose-Capillary: 150 mg/dL — ABNORMAL HIGH (ref 70–99)

## 2022-01-17 LAB — CREATININE, URINE, RANDOM: Creatinine, Urine: 150.72 mg/dL

## 2022-01-17 LAB — PHOSPHORUS: Phosphorus: 4.3 mg/dL (ref 2.5–4.6)

## 2022-01-17 MED ORDER — FUROSEMIDE 10 MG/ML IJ SOLN: 80.0000 mg | Freq: Two times a day (BID) | INTRAMUSCULAR | Status: DC

## 2022-01-17 MED ORDER — FUROSEMIDE 10 MG/ML IJ SOLN
120.0000 mg | Freq: Two times a day (BID) | INTRAVENOUS | Status: DC
  Administered 2022-01-17 – 2022-01-19 (×6): 120 mg via INTRAVENOUS
  Filled 2022-01-17 (×4): qty 10
  Filled 2022-01-17 (×2): qty 12
  Filled 2022-01-17: qty 10
  Filled 2022-01-17: qty 12
  Filled 2022-01-17: qty 10

## 2022-01-17 MED ORDER — FUROSEMIDE 10 MG/ML IJ SOLN: 60.0000 mg | Freq: Once | INTRAMUSCULAR | Status: DC

## 2022-01-17 MED ORDER — METOLAZONE 2.5 MG PO TABS
2.5000 mg | ORAL_TABLET | Freq: Once | ORAL | Status: AC
Start: 2022-01-17 — End: 2022-01-17
  Administered 2022-01-17: 2.5 mg via ORAL
  Filled 2022-01-17: qty 1

## 2022-01-17 MED ORDER — CHLORHEXIDINE GLUCONATE CLOTH 2 % EX PADS
6.0000 | MEDICATED_PAD | Freq: Every day | CUTANEOUS | Status: DC
  Administered 2022-01-17 – 2022-01-18 (×2): 6 via TOPICAL

## 2022-01-17 NOTE — Progress Notes (Signed)
ANTICOAGULATION CONSULT NOTE ? ?Pharmacy Consult for heparin ?Indication:  r/o ACS ? ?Allergies  ?Allergen Reactions  ? Trulicity [Dulaglutide]   ?  Upset his stomach  ? ? ?Patient Measurements: ?Height: 5\' 7"  (170.2 cm) ?Weight: 73.7 kg (162 lb 7.7 oz) ?IBW/kg (Calculated) : 66.1 ? ?Vital Signs: ?Temp: 98.3 ?F (36.8 ?C) (04/29 4128) ?Temp Source: Oral (04/29 7867) ?BP: 103/70 (04/29 0600) ?Pulse Rate: 90 (04/29 0747) ? ?Labs: ?Recent Labs  ?  01/15/22 ?2304 01/16/22 ?0215 01/16/22 ?0258 01/16/22 ?0501 01/16/22 ?1321 01/16/22 ?2344 01/17/22 ?0731  ?HGB 11.2* 8.8* 10.1*  --   --  9.4*  --   ?HCT 33.4* 26.0* 29.8*  --   --  26.9*  --   ?PLT 185  --  174  --   --  184  --   ?HEPARINUNFRC  --   --   --   --  <0.10* 0.35 0.49  ?CREATININE 3.79*  --  3.93* 4.29* 4.49* 4.45*  --   ?TROPONINIHS  --   --  11,409* 67,209*  --   --   --   ? ? ? ?Estimated Creatinine Clearance: 15.5 mL/min (A) (by C-G formula based on SCr of 4.45 mg/dL (H)). ? ? ?Medical History: ?Past Medical History:  ?Diagnosis Date  ? Acute renal failure (ARF) ([place]) 02/19/2015  ? CHF (congestive heart failure) ([place])   ? Coronary artery disease   ? GERD (gastroesophageal reflux disease)   ? HLD (hyperlipidemia)   ? Hypertension   ? Myocardial infarction [facility]) 01/22/2015  ? "massive"  ? Refusal of blood transfusions as patient is Jehovah's Witness   ? Seizures ([place]) 01/22/2015  ? "in front of paramedics"  ? Stroke [facility]) 01/22/2015  ? "short term memory loss & right side weak since" (02/19/2015)  ? Type II diabetes mellitus ([place])   ? ? ?Assessment: ?66yo male c/o SOB/cough x2d, O2 sat found to be mid-70s on RA, improved with 4L [place], admitted for acute CHF, now w/ troponin resulting >11,000 >> to begin heparin for possible ACS. ? ?Heparin level therapeutic: 0.49 on heparin drip rate 1300 units/hr; no bleeding noted  ?HgB trend down slightly - will continue to monitor  ? ? ?Goal of Therapy:  ?Heparin level 0.3-0.7 units/ml ?Monitor platelets by anticoagulation protocol:  Yes ?  ?Plan:  ?Continue heparin gtt at 1300 units/hr ?Daily heparin level and CBC  ?Monitor s/s bleeding  ? ? ?Bonnita Nasuti Pharm.D. CPP, BCPS ?Clinical Pharmacist ?[phone] ?01/17/2022 9:25 AM  ? ?Please check AMION for all [place] numbers ? ? ?

## 2022-01-17 NOTE — Progress Notes (Signed)
ANTICOAGULATION CONSULT NOTE ? ?Pharmacy Consult for heparin ?Indication:  r/o ACS ? ?Allergies  ?Allergen Reactions  ? Trulicity [Dulaglutide]   ?  Upset his stomach  ? ? ?Patient Measurements: ?Height: 5\' 7"  (170.2 cm) ?Weight: 80.4 kg (177 lb 4 oz) ?IBW/kg (Calculated) : 66.1 ? ?Vital Signs: ?Temp: 98.4 ?F (36.9 ?C) (04/28 2300) ?Temp Source: Oral (04/28 2300) ?BP: 130/93 (04/29 0000) ?Pulse Rate: 92 (04/29 0000) ? ?Labs: ?Recent Labs  ?  01/15/22 ?2304 01/16/22 ?0215 01/16/22 ?0258 01/16/22 ?0501 01/16/22 ?1321 01/16/22 ?2344  ?HGB 11.2* 8.8* 10.1*  --   --  9.4*  ?HCT 33.4* 26.0* 29.8*  --   --  26.9*  ?PLT 185  --  174  --   --  184  ?HEPARINUNFRC  --   --   --   --  <0.10* 0.35  ?CREATININE 3.79*  --  3.93* 4.29* 4.49* 4.45*  ?TROPONINIHS  --   --  [id number]* 80,321*  --   --   ? ? ? ?Estimated Creatinine Clearance: 16.8 mL/min (A) (by C-G formula based on SCr of 4.45 mg/dL (H)). ? ? ?Medical History: ?Past Medical History:  ?Diagnosis Date  ? Acute renal failure (ARF) ([place]) 02/19/2015  ? CHF (congestive heart failure) ([place])   ? Coronary artery disease   ? GERD (gastroesophageal reflux disease)   ? HLD (hyperlipidemia)   ? Hypertension   ? Myocardial infarction [facility]) 01/22/2015  ? "massive"  ? Refusal of blood transfusions as patient is Jehovah's Witness   ? Seizures ([place]) 01/22/2015  ? "in front of paramedics"  ? Stroke [facility]) 01/22/2015  ? "short term memory loss & right side weak since" (02/19/2015)  ? Type II diabetes mellitus ([place])   ? ? ?Assessment: ?66yo male c/o SOB/cough x2d, O2 sat found to be mid-70s on RA, improved with 4L [place], admitted for acute CHF, now w/ troponin resulting >11,000 >> to begin heparin for possible ACS. ? ?Heparin level therapeutic: 0.35 on 1300 units/hr; no bleeding reported, HgB down slightly ? ? ?Goal of Therapy:  ?Heparin level 0.3-0.7 units/ml ?Monitor platelets by anticoagulation protocol: Yes ?  ?Plan:  ?Continue heparin gtt at 1300 units/hr ?Recheck heparin level in 8 hours ? ?Georga Bora, PharmD ?Clinical Pharmacist ?01/17/2022 12:45 AM ?Please check AMION for all [place] numbers ? ? ?

## 2022-01-17 NOTE — Consult Note (Addendum)
? ?NAME:  Timothy Lambert, MRN:  [id number], DOB:  1956/01/21, LOS: 1 ?ADMISSION DATE:  01/15/2022, CONSULTATION DATE:  01/16/22 ?REFERRING MD:  Bonner Puna - [facility], CHIEF COMPLAINT:  Cardiogenic shock   ? ?History of Present Illness:  ?66 yo M PMH DM CAD, prior anterior STEMI, s/p DES mLAD, ICM, Afib, CVA, etoh abuse who was admitted to [facility] 4/28 with CC SOB and management of acute respiratory failure with hypoxia, DKA, AKI on CKD III. In interval this morning, there is concern for cardiogenic shock. Cardiology, Adv HF, Nephrology have been consulted and it has been requested for patient to transfer to ICU for further management of suspected cardiogenic shock after multiple low BP readings 0500 and 0600 4/28  ? ?The patient is DNR/I, and is of note a Raymond Gurney witness  ? ?He has not had IVF for his DKA including during ED course due to concern for acute HF ? ?PCCM consulted in this setting  ? ?Pertinent  Medical History  ?CAD ?STEMI ?CVA ?Afib ?ICM ?Etoh abuse  ?CKD III ? ?Significant Hospital Events: ?Including procedures, antibiotic start and stop dates in addition to other pertinent events   ?4/28 Admit to [facility] -- abx, insulin gtt, bipap. Cards, nephro, HF, PCCM consult ? ?Interim History / Subjective:  ?Off continuous BiPAP ?Weaned to 8L [place] ?Diuresed overnight ?Objective   ?Blood pressure 103/70, pulse 90, temperature 98.3 ?F (36.8 ?C), temperature source Oral, resp. rate (!) 22, height 5\' 7"  (1.702 m), weight 73.7 kg, SpO2 93 %. ?   ?   ? ?Intake/Output Summary (Last 24 hours) at 01/17/2022 7341 ?Last data filed at 01/17/2022 9379 ?Gross per 24 hour  ?Intake 2144.34 ml  ?Output 1050 ml  ?Net 1094.34 ml  ? ?Filed Weights  ? 01/15/22 2226 01/16/22 0822 01/17/22 0600  ?Weight: 68.9 kg 80.4 kg 73.7 kg  ? ? ?Examination: ?General: WDWN middle aged M NAD on BiPAP  ?HENT: NCAT BiPAP mask innplace with intermittent leak  ?Lungs: BiPAP supported. Symmetrical chest expansion. Few Basilar Rales  ?Cardiovascular: rrr s1s2 cap refill < 3  sec. Warm ?Abdomen: soft protuberant ndnt  ?Extremities: No acute joint deformity  ?Neuro: AAOx 3 following commands  ?GU: wnl ? ?Physical Exam: ?General: Well-appearing, no acute distress ?HENT: , AT, OP clear, MMM ?Eyes: EOMI, no scleral icterus ?Respiratory: Diminished breath sounds bilaterally.  No crackles, wheezing or rales ?Cardiovascular: RRR, -M/R/G, no JVD ?GI: BS+, soft, nontender ?Extremities: 2+ pitting edema,-tenderness ?Neuro: AAO x4, CNII-XII grossly intact ?Skin: Intact, no rashes or bruising ?Psych: Normal mood, normal affect ? ?Resolved Hospital Problem list   ? ? ?Assessment & Plan:  ? ?Acute respiratory failure with hypoxia ?Hx COPD ?-pulm edema, ?PNA ?P ?-On HFNC ?-BiPAP as needed ?-Continue CAP antibiotics ?-F/u sputum culture ? ?Acute systolic and diastolic heart failure   ?NSTEMI ?Hx CAD, hx ICM, hx STEMI s/p DES  ?pAfib, currently in NSR ?Echo with worsening EF to 40-45% compared to prior ?P ?-HF team following ?-Patient declined CVC ?-not a candidate for LHC in setting of acute illness and AKI  ?-not a candidate for advanced therapies due to poor adherence, EtOH abuse ?-on ASA, hep gtt, statin  ?-Hold BB in acute HF ? ?Cardiogenic vs hypovolemic shock - resolved ?-there was concern for possible cardiogenic shock given NSTEMI, though query if pt is intravascularly hypovolemic with DKA, without volume resuscitation ?P ?-Diuresed aggressively yesterday with ~600cc output ?-Repeat diureses ? ?AKI on CKD IIIb  - stable ?P ?-Nephrology consulted. Recommended diuresis ?-Patient plans  to discuss with wife regarding dialysis. Previously has declined ?-Monitor UOP/Cr ? ?DKA - improving ?P ?-Off insulin ?-Trend CBG ?-Trend BMET, BHA ?-On DM diet ? ?Hx Etoh abuse ?P  ?-supportive care  ? ?Hyperkalemia ?Hypomagnesemia ?Pseudohyponatremia  ?P ?-trend, replace as needed  ? ?Jehovahs Witness ?- no blood product, no albumin -- confirmed with patient 01/16/22 ? ?DNR status ?-DNR/I ?-does not wan central  line or PICC ?-Palliative care consulted ? ?Best Practice (right click and "Reselect all SmartList Selections" daily)  ? ?Diet/type: clear liquids ?DVT prophylaxis: systemic heparin ?GI prophylaxis: N/A ?Lines: N/A ?Foley:  N/A ?Code Status:  DNR ?Last date of multidisciplinary goals of care discussion [--] ? ? ?Critical care time: n/a  ?  ? ? ?The patient is critically ill with NSTEMI, acute heart failure, acute kidney failure and requires high complexity decision making for assessment and support, frequent evaluation and titration of therapies, application of advanced monitoring technologies and extensive interpretation of multiple databases.  Independent Critical Care Time: 43 Minutes.  ? ?Rodman Pickle, M.D. ?[facility] ?01/17/2022 10:28 AM  ? ?Please see Amion for pager number to reach on-call Pulmonary and Critical Care Team. ? ? ?

## 2022-01-17 NOTE — Consult Note (Signed)
? ?Palliative Care Consult Note  ?                                ?Date: 01/17/2022  ? ?Patient Name: Timothy Lambert  ?DOB: Jan 11, 1956  MRN: [id number]  Age / Sex: 66 y.o., male  ?PCP: Ladell Pier, MD ?Referring Physician: Margaretha Seeds, MD ? ?Reason for Consultation: Establishing goals of care ? ?HPI/Patient Profile: 66 y.o. male  with past medical history of chronic systolic CHF, CAD, prior STEMI in 2016 s/p DES to LAD, ICM, paroxysmal atrial fibrillation, CKD stage III, and diabetes mellitus type 2 who presented to the emergency department on 01/15/2022 with shortness of breath.  In the ED, patient was hypoxic and required BiPAP.  Chest x-ray showed congestion consistent with CHF with some concern for pneumonia.  Glucose 422, creatinine 3.79 and BNP elevated at 3400. Troponin > 11,000.  ?He was initially admitted to [facility] with acute respiratory failure with hypoxia secondary to acute CHF, DKA, and AKI on CKD.  ?PCCM and advanced heart failure consulted due to concern for cardiogenic shock.  ? ? ?Past Medical History:  ?Diagnosis Date  ? Acute renal failure (ARF) ([place]) 02/19/2015  ? CHF (congestive heart failure) ([place])   ? Coronary artery disease   ? GERD (gastroesophageal reflux disease)   ? HLD (hyperlipidemia)   ? Hypertension   ? Myocardial infarction [facility]) 01/22/2015  ? "massive"  ? Refusal of blood transfusions as patient is Jehovah's Witness   ? Seizures ([place]) 01/22/2015  ? "in front of paramedics"  ? Stroke [facility]) 01/22/2015  ? "short term memory loss & right side weak since" (02/19/2015)  ? Type II diabetes mellitus ([place])   ? ? ?Subjective:  ? ?I have reviewed medical records including EPIC notes, labs and imaging, and assessed the patient at bedside.  He is out of bed to the recliner.  He has no acute complaints at this time. ? ?I met with patient and his wife Timothy Lambert to discuss diagnosis, prognosis, GOC, EOL wishes, disposition, and options. ?I introduced  Palliative Medicine as specialized medical care for people living with serious illness. It focuses on providing relief from the symptoms and stress of a serious illness.  ? ?We discussed a brief life review of the patient.  Lord and Timothy Lambert have been married for 33 years.  They do not have children together; however, Timothy Lambert has children from a previous relationship.  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  He used to enjoy shooting pool and riding horses but has not been able to do these activities since his heart attack in 2016.  He continues to enjoy gardening and tells me he has a "green thumb". ? ?As far as functional status prior to admission, Timothy Lambert was ambulatory and fully independent.  ? ?We discussed his current illness and what it means in the larger context of his ongoing co-morbidities. Discussed that Timothy Lambert has multiple medical problems including heart failure and kidney failure.  Provided education on the natural trajectory of chronic illness, emphasizing that it is non-curable and progressive. Discussed that chronic illness results in decreased functional status over time, as patients do not usually return to previous baseline after having an illness or exacerbation.  ? ?Timothy Lambert and Timothy Lambert verbalized understanding of the seriousness of  his current medical condition.  They understand that he was critically ill and in a shock state that now seems to be improving.  They understand there is concern regarding kidney function.  We discussed that if his kidney function continues to worsen he may need dialysis.  Armas does not think that he would want dialysis. ? ?I attempted to elicit values and goals of care important to the patient.  Timothy Lambert tells me that he does not like being in the hospital.  It is important to him to be at home.  His independence is very important to  him. ? ?The difference between full scope medical intervention and comfort care was considered.  I introduced the concept of a comfort path to patient and family, emphasizing that this path involves de-escalating and stopping full scope medical interventions, allowing a natural course to occur. Discussed that the goal is comfort and dignity rather than cure/prolonging life.  Introduced hospice philosophy and provided information on home services.  ? ?At this time patient and wife are clear that they would like to continue current medical interventions with watchful waiting.  They are hopeful that his kidney function will improve, but also verbalized understanding this may not be the case.  They are open to additional goals of care conversations pending his clinical course. ? ?Questions and concerns were addressed.  The patient and family was encouraged to call with questions or concerns.  ? ? ? ?Review of Systems  ?Neurological:  Positive for weakness.  ? ?Objective:  ? ?Primary Diagnoses: ?Present on Admission: ? Acute respiratory failure with hypoxia ([place]) ? Essential hypertension ? CAD (coronary artery disease) ? Paroxysmal atrial fibrillation (HCC) ? Acute on chronic systolic CHF (congestive heart failure) ([place]) ? Acute respiratory failure with hypoxemia (HCC) ? CKD (chronic kidney disease) stage 3, GFR 30-59 ml/min (HCC) ? ? ?Physical Exam ?Vitals reviewed.  ?Constitutional:   ?   General: He is not in acute distress. ?   Appearance: He is ill-appearing.  ?Cardiovascular:  ?   Rate and Rhythm: Normal rate.  ?Pulmonary:  ?   Effort: Pulmonary effort is normal.  ?Neurological:  ?   Mental Status: He is alert and oriented to person, place, and time.  ?Psychiatric:     ?   Behavior: Behavior normal.  ? ? ?Vital Signs:  ?BP 120/70   Pulse 88   Temp 98 ?F (36.7 ?C) (Oral)   Resp (!) 26   Ht 5' 7"  (1.702 m)   Wt 73.7 kg   SpO2 93%   BMI 25.45 kg/m?  ? ?Palliative Assessment/Data: PPS 50% ? ? ? ? ?Assessment &  Plan:  ? ?SUMMARY OF RECOMMENDATIONS   ?DNR/DNI as previously documented ?Continue all current interventions with watchful waiting ?Patient and family are hopeful for improvement ?Goal of care is to return home ?PMT will continue to follow ? ?Primary Decision Maker: ?PATIENT ? ?Prognosis:  ?Unable to determine ? ?Discharge Planning:  ?To Be Determined  ? ? ? ?Thank you for allowing [place] to participate in the care of DIONDRE PULIS ? ?MDM - [place] ? ? ?Signed by: ?Elie Confer, NP ?Palliative Medicine Team ? ?Team Phone # [phone]  ?For individual providers, please see AMION ? ? ? ? ? ? ? ? ? ? ? ? ? ? ? ? ? ? ? ? ? ?  ? ?

## 2022-01-17 NOTE — Progress Notes (Signed)
?[facility] ?Progress Note  ? ?Subjective:   Feels about the same. Cont to decline central access.  Wife works 3rd shift but is by the bedside this AM --" I've been telling him to schedule an appointment to see you"   ?I/Os yesterday 2.1 / 625, already 439mL today. Labs BUN/Cr 89/4.45 from 79/4.49.  ? ? ?Objective ?Vitals:  ? 01/17/22 0500 01/17/22 0600 01/17/22 0655 01/17/22 0747  ?BP: 104/76 103/70    ?Pulse: 84 85  90  ?Resp: (!) 27 (!) 22  (!) 22  ?Temp:   98.3 ?F (36.8 ?C)   ?TempSrc:   Oral   ?SpO2: 93% 91%  93%  ?Weight:  73.7 kg    ?Height:      ? ?Physical Exam ?General: nontoxic in bed ?Heart:RRR ?Lungs: rales R base, few in L base, on HFNC ?Abdomen: soft, nontender ?Extremities: 1+ LE edema ? ? ?Additional Objective ?Labs: ?Basic Metabolic Panel: ?Recent Labs  ?Lab 01/16/22 ?0501 01/16/22 ?1321 01/16/22 ?2344  ?NA 129* 136 134*  ?K 5.6* 4.7 4.8  ?CL 101 105 104  ?CO2 13* 16* 18*  ?GLUCOSE 479* 210* 235*  ?BUN 70* 79* 89*  ?CREATININE 4.29* 4.49* 4.45*  ?CALCIUM 8.4* 8.8* 8.7*  ?PHOS  --   --  4.3  ? ?Liver Function Tests: ?Recent Labs  ?Lab 01/16/22 ?0501  ?AST 58*  ?ALT 65*  ?ALKPHOS 46  ?BILITOT 1.4*  ?PROT 6.0*  ?ALBUMIN 3.0*  ? ?No results for input(s): LIPASE, AMYLASE in the last 168 hours. ?CBC: ?Recent Labs  ?Lab 01/15/22 ?2304 01/16/22 ?0215 01/16/22 ?0258 01/16/22 ?2344  ?WBC 13.7*  --  10.2 14.8*  ?HGB 11.2* 8.8* 10.1* 9.4*  ?HCT 33.4* 26.0* 29.8* 26.9*  ?MCV 88.4  --  87.6 85.9  ?PLT 185  --  174 184  ? ?Blood Culture ?   ?Component Value Date/Time  ? [place], CLEAN CATCH 02/09/2015 0418  ? [place] NONE 02/09/2015 0418  ? CULT  02/09/2015 0418  ?  ESCHERICHIA COLI ?Performed at [facility] ?  ? REPTSTATUS 02/11/2015 FINAL 02/09/2015 0418  ? ? ?Cardiac Enzymes: ?No results for input(s): CKTOTAL, CKMB, CKMBINDEX, TROPONINI in the last 168 hours. ?CBG: ?Recent Labs  ?Lab 01/16/22 ?1815 01/16/22 ?2018 01/16/22 ?2342 01/17/22 ?4782 01/17/22 ?9562  ?GLUCAP 217* 220*  220* 177* 162*  ? ?Iron Studies: No results for input(s): IRON, TIBC, TRANSFERRIN, FERRITIN in the last 72 hours. ?@lablastinr3 @ ?Studies/Results: ?US RENAL ? ?Result Date: 01/16/2022 ?CLINICAL DATA:  Acute kidney injury EXAM: RENAL / URINARY TRACT ULTRASOUND COMPLETE COMPARISON:  April 19, 2018 FINDINGS: Right Kidney: Renal measurements: 9.9 x 4.9 x 5.4 cm = volume: 135.5 mL. Echogenicity within normal limits. No mass or hydronephrosis visualized. Left Kidney: Renal measurements: 10.2 x 5.6 x 6.4 cm (volume = 190 cm^3) ml. Echogenicity within normal limits. No mass or hydronephrosis visualized. Bladder: Appears normal for degree of bladder distention. Other: Bilateral small pleural effusion. IMPRESSION: Unremarkable ultrasound imaging of the kidneys for patient's age. Small bilateral pleural effusion. Electronically Signed   By: Frazier Richards M.D.   On: 01/16/2022 16:59  ? ?DG Chest Portable 1 View ? ?Result Date: 01/15/2022 ?CLINICAL DATA:  COVID pneumonia, hypoxia EXAM: PORTABLE CHEST 1 VIEW COMPARISON:  02/19/2015 FINDINGS: Pulmonary insufflation is normal and symmetric. There are of developed extensive bilateral perihilar and lower lung zone pulmonary infiltrates,. These are more suggestive of changes related to pulmonary edema, though infection is not excluded. Suspect small bilateral pleural effusion. Cardiac size  is mildly enlarged. Central pulmonary arteries are enlarged in keeping with changes of pulmonary arterial hypertension. No pneumothorax. IMPRESSION: Mid and lower lung zone pulmonary infiltrates, possible small bilateral pleural effusions, and mild cardiomegaly suggesting changes of mild to moderate cardiogenic failure, though pneumonic infiltrate is not completely excluded. Electronically Signed   By: Fidela Salisbury M.D.   On: 01/15/2022 23:05  ? ?ECHOCARDIOGRAM COMPLETE ? ?Result Date: 01/16/2022 ?   ECHOCARDIOGRAM REPORT   Patient Name:   Timothy Lambert Date of Exam: 01/16/2022 Medical Rec #:   [id number]         Height:       67.0 in Accession #:    [id number]        Weight:       177.2 lb Date of Birth:  Nov 12, 1955          BSA:          1.921 m? Patient Age:    66 years          BP:           100/69 mmHg Patient Gender: M                 HR:           81 bpm. Exam Location:  Inpatient Procedure: 2D Echo Indications:    CHF  History:        Patient has prior history of Echocardiogram examinations, most                 recent 05/02/2015. CHF, CAD and Previous Myocardial Infarction;                 Risk Factors:Hypertension and Diabetes.  Sonographer:    Arlyss Gandy Referring Phys: [place]  Sonographer Comments: Image acquisition challenging due to respiratory motion and supine. IMPRESSIONS  1. Left ventricular ejection fraction, by estimation, is 40 to 45%. The left ventricle has mildly decreased function. The left ventricle has no regional wall motion abnormalities. There is mild left ventricular hypertrophy. Left ventricular diastolic parameters are consistent with Grade II diastolic dysfunction (pseudonormalization).  2. Right ventricular systolic function is normal. The right ventricular size is normal. There is mildly elevated pulmonary artery systolic pressure. The estimated right ventricular systolic pressure is 48.8 mmHg.  3. Left atrial size was mildly dilated.  4. The mitral valve is normal in structure. Mild mitral valve regurgitation. No evidence of mitral stenosis.  5. The aortic valve is calcified. There is mild calcification of the aortic valve. There is mild thickening of the aortic valve. Aortic valve regurgitation is not visualized. Aortic valve sclerosis/calcification is present, without any evidence of aortic stenosis.  6. The inferior vena cava is dilated in size with <50% respiratory variability, suggesting right atrial pressure of 15 mmHg. FINDINGS  Left Ventricle: Left ventricular ejection fraction, by estimation, is 40 to 45%. The left ventricle has mildly decreased  function. The left ventricle has no regional wall motion abnormalities. The left ventricular internal cavity size was normal in size. There is mild left ventricular hypertrophy. Left ventricular diastolic parameters are consistent with Grade II diastolic dysfunction (pseudonormalization).  LV Wall Scoring: The inferior wall and basal inferolateral segment are akinetic. Right Ventricle: The right ventricular size is normal. No increase in right ventricular wall thickness. Right ventricular systolic function is normal. There is mildly elevated pulmonary artery systolic pressure. The tricuspid regurgitant velocity is 2.50  m/s, and with an assumed right atrial pressure  of 15 mmHg, the estimated right ventricular systolic pressure is 31.5 mmHg. Left Atrium: Left atrial size was mildly dilated. Right Atrium: Right atrial size was normal in size. Pericardium: There is no evidence of pericardial effusion. Mitral Valve: The mitral valve is normal in structure. Mild mitral valve regurgitation. No evidence of mitral valve stenosis. Tricuspid Valve: The tricuspid valve is normal in structure. Tricuspid valve regurgitation is mild . No evidence of tricuspid stenosis. Aortic Valve: The aortic valve is calcified. There is mild calcification of the aortic valve. There is mild thickening of the aortic valve. Aortic valve regurgitation is not visualized. Aortic valve sclerosis/calcification is present, without any evidence of aortic stenosis. Aortic valve mean gradient measures 5.0 mmHg. Aortic valve peak gradient measures 8.5 mmHg. Aortic valve area, by VTI measures 2.02 cm?. Pulmonic Valve: The pulmonic valve was normal in structure. Pulmonic valve regurgitation is not visualized. No evidence of pulmonic stenosis. Aorta: The aortic root is normal in size and structure. Venous: The inferior vena cava is dilated in size with less than 50% respiratory variability, suggesting right atrial pressure of 15 mmHg. IAS/Shunts: No atrial  level shunt detected by color flow Doppler.  LEFT VENTRICLE PLAX 2D LVIDd:         5.20 cm   Diastology LVIDs:         4.10 cm   LV e' medial:    5.44 cm/s LV PW:         1.10 cm   LV E/e' medial:  15.8 LV I

## 2022-01-17 NOTE — Evaluation (Signed)
Physical Therapy Evaluation ?Patient Details ?Name: Timothy Lambert ?MRN: [id number] ?DOB: [birth year] ?Today's Date: 01/17/2022 ? ?History of Present Illness ? Pt is a 66 y.o. male who presented 01/15/22 with SOB and positive home COVID test however his COVID and flu PCR here were negative. Pt admitted with acute respiratory failure with hypoxia secondary to acute on chronic systolic CHF, NSTEMI, and AKI on CKD 4. Transferred to ICU 4/28 secondary to hypotension in setting of DKA. PMH: diastolic dysfunction per 2D echo done in 2016, CAD, CVA, paroxysmal atrial fibrillation, DM, HTN, MI, seizures, CHF ?  ?Clinical Impression ? Pt presents with condition above and deficits mentioned below, see PT Problem List. PTA, he was IND without DME, driving, and working as a Scientist, water quality in a store. Pt lives with his wife in a house with 2 STE and 1 stair inside between the sides of the house. Currently, pt displays deficits in gross strength, balance (tends to lose balance/stagger/drift to the R), and activity tolerance/aerobic endurance. VSS on 15L O2 with mobility and 8-9L O2 at rest today. Pt is requiring up to minA to ambulate without UE support due to his balance deficits. He is at risk for falls, thus educated him on this and recommendation to use his RW at home at d/c for safety. He verbalized understanding. Pt reports his sister, who is retired, can come assist him while his wife is at work after d/c. Thus, recommending HHPT to address his deficits and maximize his return to baseline. Will continue to follow acutely.   ?   ? ?Recommendations for follow up therapy are one component of a multi-disciplinary discharge planning process, led by the attending physician.  Recommendations may be updated based on patient status, additional functional criteria and insurance authorization. ? ?Follow Up Recommendations Home health PT ? ?  ?Assistance Recommended at Discharge Intermittent Supervision/Assistance  ?Patient can return home with  the following ? A little help with walking and/or transfers;A little help with bathing/dressing/bathroom;Assistance with cooking/housework;Assist for transportation;Help with stairs or ramp for entrance ? ?  ?Equipment Recommendations None recommended by PT  ?Recommendations for Other Services ?    ?  ?Functional Status Assessment Patient has had a recent decline in their functional status and demonstrates the ability to make significant improvements in function in a reasonable and predictable amount of time.  ? ?  ?Precautions / Restrictions Precautions ?Precautions: Fall;Other (comment) ?Precaution Comments: watch SpO2 ?Restrictions ?Weight Bearing Restrictions: No  ? ?  ? ?Mobility ? Bed Mobility ?Overal bed mobility: Modified Independent ?  ?  ?  ?  ?  ?  ?General bed mobility comments: Pt able to transition supine > sit EOB with HOB elevated without assistance. ?  ? ?Transfers ?Overall transfer level: Needs assistance ?Equipment used: None ?Transfers: Sit to/from Stand ?Sit to Stand: Min guard ?  ?  ?  ?  ?  ?General transfer comment: Min guard assist for safety, mild unsteadiness noted with trunk sway, no LOB. ?  ? ?Ambulation/Gait ?Ambulation/Gait assistance: Min assist ?Gait Distance (Feet): 310 Feet (x2 bouts of ~310 ft > ~100 ft) ?Assistive device: None ?Gait Pattern/deviations: Step-through pattern, Decreased stride length, Staggering right ?Gait velocity: reduced ?Gait velocity interpretation: <1.8 ft/sec, indicate of risk for recurrent falls ?  ?General Gait Details: Pt with slow gait and tendency to drift/stagger to the R, needing minA to prevent LOB. Pt needing x1 standing rest break during ~310 ft gait bout then a seated rest break after ~310 ft before second gait  bout. ? ?Stairs ?  ?  ?  ?  ?  ? ?Wheelchair Mobility ?  ? ?Modified Rankin (Stroke Patients Only) ?  ? ?  ? ?Balance Overall balance assessment: Needs assistance ?Sitting-balance support: No upper extremity supported, Feet  supported ?Sitting balance-Leahy Scale: Good ?  ?  ?Standing balance support: No upper extremity supported, During functional activity ?Standing balance-Leahy Scale: Poor ?Standing balance comment: Pt with LOB to R, needing minA for stability with standing mobility. ?  ?  ?  ?  ?  ?  ?  ?  ?  ?  ?  ?   ? ? ? ?Pertinent Vitals/Pain Pain Assessment ?Pain Assessment: Faces ?Faces Pain Scale: No hurt ?Pain Intervention(s): Monitored during session  ? ? ?Home Living Family/patient expects to be discharged to:: Private residence ?Living Arrangements: Spouse/significant other ?Available Help at Discharge: Family;Available 24 hours/day (sister who is retired can assist while wife is at work) ?Type of Home: House ?Home Access: Stairs to enter ?Entrance Stairs-Rails: None ?Entrance Stairs-Number of Steps: 2 ?  ?Home Layout:  (x1 step between sides of house inside) ?Home Equipment: Conservation officer, nature (2 wheels);Cane - quad;Cane - single point;Wheelchair - manual ?   ?  ?Prior Function Prior Level of Function : Independent/Modified Independent;Driving;Working/employed ?  ?  ?  ?  ?  ?  ?Mobility Comments: Does not use AD. ?ADLs Comments: Works as a Scientist, water quality in a store. Tries not to drive at night due to vision issues at night. ?  ? ? ?Hand Dominance  ?   ? ?  ?Extremity/Trunk Assessment  ? Upper Extremity Assessment ?Upper Extremity Assessment: Defer to OT evaluation ?  ? ?Lower Extremity Assessment ?Lower Extremity Assessment: Generalized weakness ?  ? ?Cervical / Trunk Assessment ?Cervical / Trunk Assessment: Normal  ?Communication  ? Communication: No difficulties  ?Cognition Arousal/Alertness: Awake/alert ?Behavior During Therapy: [facility] for tasks assessed/performed ?Overall Cognitive Status: Within Functional Limits for tasks assessed ?  ?  ?  ?  ?  ?  ?  ?  ?  ?  ?  ?  ?  ?  ?  ?  ?  ?  ?  ? ?  ?General Comments General comments (skin integrity, edema, etc.): VSS on 15L during gait, 8-9L O2 at rest with VSS; educated pt on and  provided pt with incentive spirometer; educated pt to use his RW at home at this time due to being at risk for falls, he verbalized understanding ? ?  ?Exercises Other Exercises ?Other Exercises: incentive spirometer x5  ? ?Assessment/Plan  ?  ?PT Assessment Patient needs continued PT services  ?PT Problem List Decreased strength;Decreased activity tolerance;Decreased balance;Decreased mobility;Cardiopulmonary status limiting activity ? ?   ?  ?PT Treatment Interventions DME instruction;Gait training;Stair training;Therapeutic activities;Functional mobility training;Therapeutic exercise;Neuromuscular re-education;Balance training;Patient/family education   ? ?PT Goals (Current goals can be found in the Care Plan section)  ?Acute Rehab PT Goals ?Patient Stated Goal: to go home ?PT Goal Formulation: With patient ?Time For Goal Achievement: 01/31/22 ?Potential to Achieve Goals: Good ? ?  ?Frequency Min 3X/week ?  ? ? ?Co-evaluation   ?  ?  ?  ?  ? ? ?  ?AM-PAC PT "6 Clicks" Mobility  ?Outcome Measure Help needed turning from your back to your side while in a flat bed without using bedrails?: None ?Help needed moving from lying on your back to sitting on the side of a flat bed without using bedrails?: None ?Help needed moving to and  from a bed to a chair (including a wheelchair)?: A Little ?Help needed standing up from a chair using your arms (e.g., wheelchair or bedside chair)?: A Little ?Help needed to walk in hospital room?: A Little ?Help needed climbing 3-5 steps with a railing? : A Little ?6 Click Score: 20 ? ?  ?End of Session Equipment Utilized During Treatment: Gait belt;Oxygen ?Activity Tolerance: Patient tolerated treatment well ?Patient left: in chair;with call bell/phone within reach ?Nurse Communication: Mobility status;Other (comment) (sats) ?PT Visit Diagnosis: Unsteadiness on feet (R26.81);Other abnormalities of gait and mobility (R26.89);Muscle weakness (generalized) (M62.81);Difficulty in walking, not  elsewhere classified (R26.2) ?  ? ?Time: 1712-7871 ?PT Time Calculation (min) (ACUTE ONLY): 30 min ? ? ?Charges:   PT Evaluation ?$PT Eval Moderate Complexity: 1 Mod ?PT Treatments ?$Gait Training: 8-22 mins ?  ?   ?

## 2022-01-17 NOTE — Progress Notes (Signed)
? ? ?Advanced Heart Failure Rounding Note ? ? ?Subjective:   ? ?Still with cough and orthopnea. No CP. Only 625cc of urine charted with IV lasix. Weights inaccurate.  ? ?Sats 87-91% on HFNC  Off pressors. SBP 100-110 MAPs in 80s  ? ?Off insulin gtt. Sugars still high. Bicarb 16-> 18 ? ?Scr 4.49 -> 4.45 ? ?Refusing central access.  ? ? ? ?Objective:   ?Weight Range: ? ?Vital Signs:   ?Temp:  [97.6 ?F (36.4 ?C)-98.4 ?F (36.9 ?C)] 98.3 ?F (36.8 ?C) (04/29 9357) ?Pulse Rate:  [83-92] 90 (04/29 0747) ?Resp:  [17-29] 22 (04/29 0747) ?BP: (89-130)/(61-93) 103/70 (04/29 0600) ?SpO2:  [90 %-100 %] 93 % (04/29 0747) ?Weight:  [73.7 kg] 73.7 kg (04/29 0600) ?Last BM Date : 01/15/22 ? ?Weight change: ?Filed Weights  ? 01/15/22 2226 01/16/22 0822 01/17/22 0600  ?Weight: 68.9 kg 80.4 kg 73.7 kg  ? ? ?Intake/Output:  ? ?Intake/Output Summary (Last 24 hours) at 01/17/2022 1015 ?Last data filed at 01/17/2022 0177 ?Gross per 24 hour  ?Intake 2144.34 ml  ?Output 825 ml  ?Net 1319.34 ml  ?  ? ?Physical Exam: ?General:  Sitting up in bed. No resp difficulty ?HEENT: normal ?Neck: supple. JVP to jaw . Carotids 2+ bilat; no bruits. No lymphadenopathy or thryomegaly appreciated. ?Cor: PMI nondisplaced. Regular rate & rhythm. No rubs, gallops or murmurs. ?Lungs: + crackles ?Abdomen: soft, nontender, nondistended. No hepatosplenomegaly. No bruits or masses. Good bowel sounds. ?Extremities: no cyanosis, clubbing, rash, 2+ edema ?Neuro: alert & orientedx3, cranial nerves grossly intact. moves all 4 extremities w/o difficulty. Affect pleasant ? ?Telemetry: sinus 90s Personally reviewed ? ?Labs: ?Basic Metabolic Panel: ?Recent Labs  ?Lab 01/15/22 ?2304 01/16/22 ?0215 01/16/22 ?0258 01/16/22 ?0501 01/16/22 ?1321 01/16/22 ?2344  ?NA 134* 131*  --  129* 136 134*  ?K 5.4* 5.9*  --  5.6* 4.7 4.8  ?CL 104  --   --  101 105 104  ?CO2 17*  --   --  13* 16* 18*  ?GLUCOSE 422*  --   --  479* 210* 235*  ?BUN 61*  --   --  70* 79* 89*  ?CREATININE 3.79*  --   3.93* 4.29* 4.49* 4.45*  ?CALCIUM 8.7*  --   --  8.4* 8.8* 8.7*  ?MG  --   --  1.6*  --   --  1.8  ?PHOS  --   --   --   --   --  4.3  ? ? ?Liver Function Tests: ?Recent Labs  ?Lab 01/16/22 ?0501  ?AST 58*  ?ALT 65*  ?ALKPHOS 46  ?BILITOT 1.4*  ?PROT 6.0*  ?ALBUMIN 3.0*  ? ?No results for input(s): LIPASE, AMYLASE in the last 168 hours. ?No results for input(s): AMMONIA in the last 168 hours. ? ?CBC: ?Recent Labs  ?Lab 01/15/22 ?2304 01/16/22 ?0215 01/16/22 ?0258 01/16/22 ?2344  ?WBC 13.7*  --  10.2 14.8*  ?HGB 11.2* 8.8* 10.1* 9.4*  ?HCT 33.4* 26.0* 29.8* 26.9*  ?MCV 88.4  --  87.6 85.9  ?PLT 185  --  174 184  ? ? ?Cardiac Enzymes: ?No results for input(s): CKTOTAL, CKMB, CKMBINDEX, TROPONINI in the last 168 hours. ? ?BNP: ?BNP (last 3 results) ?Recent Labs  ?  01/15/22 ?2304  ?BNP 3,498.1*  ? ? ?ProBNP (last 3 results) ?No results for input(s): PROBNP in the last 8760 hours. ? ? ? ?Other results: ? ?Imaging: ?US RENAL ? ?Result Date: 01/16/2022 ?CLINICAL DATA:  Acute kidney injury EXAM:  RENAL / URINARY TRACT ULTRASOUND COMPLETE COMPARISON:  April 19, 2018 FINDINGS: Right Kidney: Renal measurements: 9.9 x 4.9 x 5.4 cm = volume: 135.5 mL. Echogenicity within normal limits. No mass or hydronephrosis visualized. Left Kidney: Renal measurements: 10.2 x 5.6 x 6.4 cm (volume = 190 cm^3) ml. Echogenicity within normal limits. No mass or hydronephrosis visualized. Bladder: Appears normal for degree of bladder distention. Other: Bilateral small pleural effusion. IMPRESSION: Unremarkable ultrasound imaging of the kidneys for patient's age. Small bilateral pleural effusion. Electronically Signed   By: Frazier Richards M.D.   On: 01/16/2022 16:59  ? ?DG Chest Portable 1 View ? ?Result Date: 01/15/2022 ?CLINICAL DATA:  COVID pneumonia, hypoxia EXAM: PORTABLE CHEST 1 VIEW COMPARISON:  02/19/2015 FINDINGS: Pulmonary insufflation is normal and symmetric. There are of developed extensive bilateral perihilar and lower lung zone pulmonary  infiltrates,. These are more suggestive of changes related to pulmonary edema, though infection is not excluded. Suspect small bilateral pleural effusion. Cardiac size is mildly enlarged. Central pulmonary arteries are enlarged in keeping with changes of pulmonary arterial hypertension. No pneumothorax. IMPRESSION: Mid and lower lung zone pulmonary infiltrates, possible small bilateral pleural effusions, and mild cardiomegaly suggesting changes of mild to moderate cardiogenic failure, though pneumonic infiltrate is not completely excluded. Electronically Signed   By: Fidela Salisbury M.D.   On: 01/15/2022 23:05  ? ?ECHOCARDIOGRAM COMPLETE ? ?Result Date: 01/16/2022 ?   ECHOCARDIOGRAM REPORT   Patient Name:   Timothy Lambert Date of Exam: 01/16/2022 Medical Rec #:  [id number]         Height:       67.0 in Accession #:    [id number]        Weight:       177.2 lb Date of Birth:  1956/03/09          BSA:          1.921 m? Patient Age:    66 years          BP:           100/69 mmHg Patient Gender: M                 HR:           81 bpm. Exam Location:  Inpatient Procedure: 2D Echo Indications:    CHF  History:        Patient has prior history of Echocardiogram examinations, most                 recent 05/02/2015. CHF, CAD and Previous Myocardial Infarction;                 Risk Factors:Hypertension and Diabetes.  Sonographer:    Arlyss Gandy Referring Phys: [place]  Sonographer Comments: Image acquisition challenging due to respiratory motion and supine. IMPRESSIONS  1. Left ventricular ejection fraction, by estimation, is 40 to 45%. The left ventricle has mildly decreased function. The left ventricle has no regional wall motion abnormalities. There is mild left ventricular hypertrophy. Left ventricular diastolic parameters are consistent with Grade II diastolic dysfunction (pseudonormalization).  2. Right ventricular systolic function is normal. The right ventricular size is normal. There is mildly elevated  pulmonary artery systolic pressure. The estimated right ventricular systolic pressure is 34.7 mmHg.  3. Left atrial size was mildly dilated.  4. The mitral valve is normal in structure. Mild mitral valve regurgitation. No evidence of mitral stenosis.  5. The aortic valve is calcified. There is  mild calcification of the aortic valve. There is mild thickening of the aortic valve. Aortic valve regurgitation is not visualized. Aortic valve sclerosis/calcification is present, without any evidence of aortic stenosis.  6. The inferior vena cava is dilated in size with <50% respiratory variability, suggesting right atrial pressure of 15 mmHg. FINDINGS  Left Ventricle: Left ventricular ejection fraction, by estimation, is 40 to 45%. The left ventricle has mildly decreased function. The left ventricle has no regional wall motion abnormalities. The left ventricular internal cavity size was normal in size. There is mild left ventricular hypertrophy. Left ventricular diastolic parameters are consistent with Grade II diastolic dysfunction (pseudonormalization).  LV Wall Scoring: The inferior wall and basal inferolateral segment are akinetic. Right Ventricle: The right ventricular size is normal. No increase in right ventricular wall thickness. Right ventricular systolic function is normal. There is mildly elevated pulmonary artery systolic pressure. The tricuspid regurgitant velocity is 2.50  m/s, and with an assumed right atrial pressure of 15 mmHg, the estimated right ventricular systolic pressure is 66.4 mmHg. Left Atrium: Left atrial size was mildly dilated. Right Atrium: Right atrial size was normal in size. Pericardium: There is no evidence of pericardial effusion. Mitral Valve: The mitral valve is normal in structure. Mild mitral valve regurgitation. No evidence of mitral valve stenosis. Tricuspid Valve: The tricuspid valve is normal in structure. Tricuspid valve regurgitation is mild . No evidence of tricuspid stenosis.  Aortic Valve: The aortic valve is calcified. There is mild calcification of the aortic valve. There is mild thickening of the aortic valve. Aortic valve regurgitation is not visualized. Aortic valve [place]

## 2022-01-17 NOTE — Progress Notes (Signed)
Inpatient Diabetes Program Recommendations ? ?AACE/ADA: New Consensus Statement on Inpatient Glycemic Control  ? ?Target Ranges:  Prepandial:   less than 140 mg/dL ?     Peak postprandial:   less than 180 mg/dL (1-2 hours) ?     Critically ill patients:  140 - 180 mg/dL  ? ? Latest Reference Range & Units 01/17/22 05:13 01/17/22 06:52  ?Glucose-Capillary 70 - 99 mg/dL 177 (H) 162 (H)  ? ? Latest Reference Range & Units 01/16/22 16:25 01/16/22 18:15 01/16/22 20:18 01/16/22 23:42  ?Glucose-Capillary 70 - 99 mg/dL 205 (H) 217 (H) 220 (H) 220 (H)  ? ? Latest Reference Range & Units 01/15/22 23:04  ?CO2 22 - 32 mmol/L 17 (L)  ?Glucose 70 - 99 mg/dL 422 (H)  ?Anion gap 5 - 15  13  ? ? Latest Reference Range & Units 12/30/21 13:49  ?Hemoglobin A1C 4.8 - 5.6 % 14.6 (H)  ? ?Review of Glycemic Control ? ?Diabetes history: DM2 ?Outpatient Diabetes medications: Lantus 32 units daily ?Current orders for Inpatient glycemic control: Semglee 32 units QHS, Novolog 0-15 units Q4H ? ?Inpatient Diabetes Program Recommendations:   ? ?Insulin: Agree with current insulin orders. ? ?NOTE: Noted consult for Diabetes Coordinator. Diabetes Coordinator is not on campus over the weekend but available by pager from 8am to 5pm for questions or concerns. Chart reviewed. Noted patient seen Carrolyn Meiers, PA-C on 12/30/21 and per office note "States today that he has been using the Lantus 28 units during the day.  Does also endorse that he has not used any medication for the last "2 to 3 weeks".  States that he did restart it yesterday. Wife is present and states that it seems like he had not been taking the medication longer than he says. Glucose in clinic 219.  Patient is a very reluctant historian, states that he believes the only reason his A1c went from 7.1 to over 13 in a 71-month.  Was because he was out of his medication for 2 to 3 weeks.  No refills needed today."  Per H&P on 01/16/22, patient admitted with acute respiratory failure with  hypoxia. Initial glucose of 422 mg/dl on 01/15/22 and glucose up to 503 mg/dl at 8:17 on 01/16/22 and patient started on IV insulin. Patient has since then been transitioned to SQ insulin and received Semglee 32 units at 17:10 on 01/16/22. Will follow along while inpatient. ? ?Thanks, ?Barnie Alderman, RN, MSN, CDE ?Diabetes Coordinator ?Inpatient Diabetes Program ?[phone] (Team Pager from 8am to 5pm) ? ? ? ?

## 2022-01-18 DIAGNOSIS — J9601 Acute respiratory failure with hypoxia: Secondary | ICD-10-CM | POA: Diagnosis not present

## 2022-01-18 DIAGNOSIS — N179 Acute kidney failure, unspecified: Secondary | ICD-10-CM | POA: Diagnosis not present

## 2022-01-18 DIAGNOSIS — I25119 Atherosclerotic heart disease of native coronary artery with unspecified angina pectoris: Secondary | ICD-10-CM | POA: Diagnosis not present

## 2022-01-18 DIAGNOSIS — I5023 Acute on chronic systolic (congestive) heart failure: Secondary | ICD-10-CM | POA: Diagnosis not present

## 2022-01-18 LAB — GLUCOSE, CAPILLARY
Glucose-Capillary: 71 mg/dL (ref 70–99)
Glucose-Capillary: 71 mg/dL (ref 70–99)
Glucose-Capillary: 138 mg/dL — ABNORMAL HIGH (ref 70–99)
Glucose-Capillary: 170 mg/dL — ABNORMAL HIGH (ref 70–99)

## 2022-01-18 LAB — IRON AND TIBC
Iron: 74 ug/dL (ref 45–182)
Saturation Ratios: 35 % (ref 17.9–39.5)
TIBC: 214 ug/dL — ABNORMAL LOW (ref 250–450)
UIBC: 140 ug/dL

## 2022-01-18 LAB — CBC
HCT: 27.5 % — ABNORMAL LOW (ref 39.0–52.0)
Hemoglobin: 9.4 g/dL — ABNORMAL LOW (ref 13.0–17.0)
MCH: 29.6 pg (ref 26.0–34.0)
MCHC: 34.2 g/dL (ref 30.0–36.0)
MCV: 86.5 fL (ref 80.0–100.0)
Platelets: 199 10*3/uL (ref 150–400)
RBC: 3.18 MIL/uL — ABNORMAL LOW (ref 4.22–5.81)
RDW: 13.5 % (ref 11.5–15.5)
WBC: 12.5 10*3/uL — ABNORMAL HIGH (ref 4.0–10.5)
nRBC: 0 % (ref 0.0–0.2)

## 2022-01-18 LAB — BASIC METABOLIC PANEL
Anion gap: 11 (ref 5–15)
BUN: 114 mg/dL — ABNORMAL HIGH (ref 8–23)
CO2: 20 mmol/L — ABNORMAL LOW (ref 22–32)
Calcium: 8.5 mg/dL — ABNORMAL LOW (ref 8.9–10.3)
Chloride: 105 mmol/L (ref 98–111)
Creatinine, Ser: 4.58 mg/dL — ABNORMAL HIGH (ref 0.61–1.24)
GFR, Estimated: 13 mL/min — ABNORMAL LOW (ref >60)
Glucose, Bld: 81 mg/dL (ref 70–99)
Potassium: 4.3 mmol/L (ref 3.5–5.1)
Sodium: 136 mmol/L (ref 135–145)

## 2022-01-18 LAB — PHOSPHORUS: Phosphorus: 5.3 mg/dL — ABNORMAL HIGH (ref 2.5–4.6)

## 2022-01-18 LAB — HEPARIN LEVEL (UNFRACTIONATED): Heparin Unfractionated: 0.49 IU/mL (ref 0.30–0.70)

## 2022-01-18 LAB — MAGNESIUM: Magnesium: 1.9 mg/dL (ref 1.7–2.4)

## 2022-01-18 LAB — FERRITIN: Ferritin: 363 ng/mL — ABNORMAL HIGH (ref 24–336)

## 2022-01-18 MED ORDER — AMIODARONE HCL IN DEXTROSE 360-4.14 MG/200ML-% IV SOLN
60.0000 mg/h | INTRAVENOUS | Status: AC
  Administered 2022-01-18: 60 mg/h via INTRAVENOUS
  Filled 2022-01-18: qty 200

## 2022-01-18 MED ORDER — AMIODARONE HCL IN DEXTROSE 360-4.14 MG/200ML-% IV SOLN
30.0000 mg/h | INTRAVENOUS | Status: DC
  Administered 2022-01-18 – 2022-01-20 (×6): 30 mg/h via INTRAVENOUS
  Filled 2022-01-18 (×5): qty 200

## 2022-01-18 MED ORDER — ENOXAPARIN SODIUM 40 MG/0.4ML IJ SOSY: 40.0000 mg | PREFILLED_SYRINGE | INTRAMUSCULAR | Status: DC

## 2022-01-18 MED ORDER — HEPARIN (PORCINE) 25000 UT/250ML-% IV SOLN
1450.0000 [IU]/h | INTRAVENOUS | Status: DC
  Administered 2022-01-18 (×2): 1300 [IU]/h via INTRAVENOUS
  Administered 2022-01-19: 1450 [IU]/h via INTRAVENOUS
  Filled 2022-01-18 (×2): qty 250

## 2022-01-18 MED ORDER — ENOXAPARIN SODIUM 30 MG/0.3ML IJ SOSY: 30.0000 mg | PREFILLED_SYRINGE | INTRAMUSCULAR | Status: DC

## 2022-01-18 MED ORDER — METOLAZONE 5 MG PO TABS
5.0000 mg | ORAL_TABLET | Freq: Once | ORAL | Status: AC
  Administered 2022-01-18: 5 mg via ORAL
  Filled 2022-01-18: qty 1

## 2022-01-18 MED ORDER — AMIODARONE LOAD VIA INFUSION
150.0000 mg | Freq: Once | INTRAVENOUS | Status: AC
  Administered 2022-01-18: 150 mg via INTRAVENOUS
  Filled 2022-01-18: qty 83.34

## 2022-01-18 NOTE — Progress Notes (Signed)
?[facility] ?Progress Note  ? ?Subjective:   Feels a bit better but remains on HFNC.  I/Os yest  985 / 1695.  BUN/Cr 114 / 4.6 from 94/4.5.  Wife not present.  He's eating a good lunch.  Denies uremic signs ? ? ?Objective ?Vitals:  ? 01/18/22 0800 01/18/22 0900 01/18/22 1000 01/18/22 1132  ?BP: 120/72 115/69 120/68 114/76  ?Pulse: 90 89 81 94  ?Resp: (!) 25 (!) 25 (!) 22 20  ?Temp: 98 ?F (36.7 ?C)   98.1 ?F (36.7 ?C)  ?TempSrc: Oral   Oral  ?SpO2: 93% 90% 93% 93%  ?Weight:      ?Height:      ? ?Physical Exam ?General: nontoxic in bed ?Heart:RRR ?Lungs: rales R base, few in L base, on HFNC ?Abdomen: soft, nontender ?Extremities: trace- 1+ LE edema, slightly better  ? ? ?Additional Objective ?Labs: ?Basic Metabolic Panel: ?Recent Labs  ?Lab 01/16/22 ?2344 01/17/22 ?3536 01/18/22 ?1443  ?NA 134* 135 136  ?K 4.8 4.7 4.3  ?CL 104 104 105  ?CO2 18* 18* 20*  ?GLUCOSE 235* 192* 81  ?BUN 89* 94* 114*  ?CREATININE 4.45* 4.49* 4.58*  ?CALCIUM 8.7* 8.7* 8.5*  ?PHOS 4.3  --  5.3*  ? ? ?Liver Function Tests: ?Recent Labs  ?Lab 01/16/22 ?0501  ?AST 58*  ?ALT 65*  ?ALKPHOS 46  ?BILITOT 1.4*  ?PROT 6.0*  ?ALBUMIN 3.0*  ? ? ?No results for input(s): LIPASE, AMYLASE in the last 168 hours. ?CBC: ?Recent Labs  ?Lab 01/15/22 ?2304 01/16/22 ?0215 01/16/22 ?0258 01/16/22 ?2344 01/18/22 ?1540  ?WBC 13.7*  --  10.2 14.8* 12.5*  ?HGB 11.2*   < > 10.1* 9.4* 9.4*  ?HCT 33.4*   < > 29.8* 26.9* 27.5*  ?MCV 88.4  --  87.6 85.9 86.5  ?PLT 185  --  174 184 199  ? < > = values in this interval not displayed.  ? ? ?Blood Culture ?   ?Component Value Date/Time  ? [place], CLEAN CATCH 02/09/2015 0418  ? [place] NONE 02/09/2015 0418  ? CULT  02/09/2015 0418  ?  ESCHERICHIA COLI ?Performed at [facility] ?  ? REPTSTATUS 02/11/2015 FINAL 02/09/2015 0418  ? ? ?Cardiac Enzymes: ?No results for input(s): CKTOTAL, CKMB, CKMBINDEX, TROPONINI in the last 168 hours. ?CBG: ?Recent Labs  ?Lab 01/17/22 ?1653 01/17/22 ?1949  01/17/22 ?2308 01/18/22 ?0330 01/18/22 ?0867  ?GLUCAP 258* 207* 150* 71 71  ? ? ?Iron Studies:  ?Recent Labs  ?  01/18/22 ?6195  ?IRON 74  ?TIBC 214*  ?FERRITIN 363*  ? ?@lablastinr3 @ ?Studies/Results: ?US RENAL ? ?Result Date: 01/16/2022 ?CLINICAL DATA:  Acute kidney injury EXAM: RENAL / URINARY TRACT ULTRASOUND COMPLETE COMPARISON:  April 19, 2018 FINDINGS: Right Kidney: Renal measurements: 9.9 x 4.9 x 5.4 cm = volume: 135.5 mL. Echogenicity within normal limits. No mass or hydronephrosis visualized. Left Kidney: Renal measurements: 10.2 x 5.6 x 6.4 cm (volume = 190 cm^3) ml. Echogenicity within normal limits. No mass or hydronephrosis visualized. Bladder: Appears normal for degree of bladder distention. Other: Bilateral small pleural effusion. IMPRESSION: Unremarkable ultrasound imaging of the kidneys for patient's age. Small bilateral pleural effusion. Electronically Signed   By: Frazier Richards M.D.   On: 01/16/2022 16:59   ?Medications: ? azithromycin Stopped (01/18/22 0258)  ? cefTRIAXone (ROCEPHIN)  IV Stopped (01/18/22 0133)  ? furosemide 120 mg (01/18/22 0859)  ? ? (feeding supplement) PROSource Plus  30 mL Oral TID BM  ? aspirin EC  81 mg Oral Daily  ? atorvastatin  80 mg Oral Daily  ? Chlorhexidine Gluconate Cloth  6 each Topical Daily  ? [START ON 01/19/2022] enoxaparin (LOVENOX) injection  30 mg Subcutaneous Q24H  ? insulin aspart  0-15 Units Subcutaneous Q4H  ? insulin glargine-yfgn  32 Units Subcutaneous QHS  ? multivitamin with minerals  1 tablet Oral Daily  ? ? ?Assessment/Plan: 57M CKD 3b secondary to poorly controlled DM (f/b me outpt but hasn't been to appts recently), A fib,  HFrEF 40-45% + grade 2 DD, h/o CVA who is currently admitted for DKA + recent history of orthopnea/dyspnea and LE edema after no meds x 1 month.   ? ?**AKI on CKD 4:  Baseline Cr 2.5-2.8mg /dL recently now with AKI in setting of DKA and shock - expect ischemic tubular injury on background of diabetic kidney disease.  Renal US  normal. UA modest protein o/w ok.  UP/C 0.38.  Renal function slightly worse today but no uremic symptoms.  He's making adequate urine with diuresis.   No indications for RRT and he's not sure he'd accept it if indicated.  D/w he and his wife at length yesterday and with him again today that although not necessary now, need in future may arise (as soon as this admission possibly) and they need to discuss.  If he choose conservative care that would be fine.  ? ?**Shock:  Had some low BPs in setting of DKA but this improved with small fluid bolus.  Never required pressors.  Being treated for possible CAP.  ? ?**Combined CHF, h/o CAD, A fib: TTE 4/28 EF 40-45%, grade 2 DD.  Diuresis today, agree with cardiology who is following.  Declining central access for monitoring/meds.  On heparin for possible NSTEMI, no plans for cath in light of renal function.  ? ?**CAP: on CTX for possible PNA ? ?**h/o EtOH abuse: per chart wife noting still drinks a good bit.  ? ?**Metabolic acidosis:  improving with DKA tx.  Monitor. ? ?**DM:  uncontrolled, pt not adherent to tx, BG control improving but still high ? ?**Anemia:  Hb 9s.  Jehovah's witness.  Iron replete.  ? ?Will follow, call with concerns.  ? ?Jannifer Hick MD ?01/18/2022, 1:43 PM  ?[facility] ?Pager: [phone] ? ? ?

## 2022-01-18 NOTE — Progress Notes (Signed)
? ? ?Advanced Heart Failure Rounding Note ? ? ?Subjective:   ? ?Given lasix 120 iv bid yesterday + metolazone 5 -> 1.7 L out  ? ?Weight down 2 pounds. Remains on HFNC. Sats still low 90s. MAPs 80-90s.  ? ?Breathing better but still tachypneic. + orthopnea. No CP.  ? ?Scr 4.49 -> 4.45 -> 4.59 ? ? ? ?Objective:   ?Weight Range: ? ?Vital Signs:   ?Temp:  [98 ?F (36.7 ?C)-98.3 ?F (36.8 ?C)] 98 ?F (36.7 ?C) (04/30 9833) ?Pulse Rate:  [79-89] 85 (04/30 0600) ?Resp:  [19-27] 25 (04/30 0600) ?BP: (104-140)/(60-97) 113/97 (04/30 0600) ?SpO2:  [88 %-95 %] 91 % (04/30 0600) ?Weight:  [79.5 kg] 79.5 kg (04/30 0500) ?Last BM Date : 01/15/22 ? ?Weight change: ?Filed Weights  ? 01/16/22 0822 01/17/22 0600 01/18/22 0500  ?Weight: 80.4 kg 73.7 kg 79.5 kg  ? ? ?Intake/Output:  ? ?Intake/Output Summary (Last 24 hours) at 01/18/2022 0925 ?Last data filed at 01/18/2022 8250 ?Gross per 24 hour  ?Intake 946.82 ml  ?Output 1795 ml  ?Net -848.18 ml  ? ?  ? ?Physical Exam: ?General:  Sitting in chair No resp difficulty ?HEENT: normal ?Neck: supple. JVP 8 Carotids 2+ bilat; no bruits. No lymphadenopathy or thryomegaly appreciated. ?Cor: PMI nondisplaced. Regular rate & rhythm. No rubs, gallops or murmurs. ?Lungs: basilar crackles ?Abdomen: soft, nontender, nondistended. No hepatosplenomegaly. No bruits or masses. Good bowel sounds. ?Extremities: no cyanosis, clubbing, rash, edema + TED ?Neuro: alert & orientedx3, cranial nerves grossly intact. moves all 4 extremities w/o difficulty. Affect pleasant ? ? ?Telemetry: sinus 80-90s Personally reviewed ? ?Labs: ?Basic Metabolic Panel: ?Recent Labs  ?Lab 01/16/22 ?0258 01/16/22 ?0501 01/16/22 ?1321 01/16/22 ?2344 01/17/22 ?5397 01/18/22 ?6734  ?NA  --  129* 136 134* 135 136  ?K  --  5.6* 4.7 4.8 4.7 4.3  ?CL  --  101 105 104 104 105  ?CO2  --  13* 16* 18* 18* 20*  ?GLUCOSE  --  479* 210* 235* 192* 81  ?BUN  --  70* 79* 89* 94* 114*  ?CREATININE 3.93* 4.29* 4.49* 4.45* 4.49* 4.58*  ?CALCIUM  --  8.4*  8.8* 8.7* 8.7* 8.5*  ?MG 1.6*  --   --  1.8 1.8 1.9  ?PHOS  --   --   --  4.3  --  5.3*  ? ? ? ?Liver Function Tests: ?Recent Labs  ?Lab 01/16/22 ?0501  ?AST 58*  ?ALT 65*  ?ALKPHOS 46  ?BILITOT 1.4*  ?PROT 6.0*  ?ALBUMIN 3.0*  ? ? ?No results for input(s): LIPASE, AMYLASE in the last 168 hours. ?No results for input(s): AMMONIA in the last 168 hours. ? ?CBC: ?Recent Labs  ?Lab 01/15/22 ?2304 01/16/22 ?0215 01/16/22 ?0258 01/16/22 ?2344 01/18/22 ?1937  ?WBC 13.7*  --  10.2 14.8* 12.5*  ?HGB 11.2* 8.8* 10.1* 9.4* 9.4*  ?HCT 33.4* 26.0* 29.8* 26.9* 27.5*  ?MCV 88.4  --  87.6 85.9 86.5  ?PLT 185  --  174 184 199  ? ? ? ?Cardiac Enzymes: ?No results for input(s): CKTOTAL, CKMB, CKMBINDEX, TROPONINI in the last 168 hours. ? ?BNP: ?BNP (last 3 results) ?Recent Labs  ?  01/15/22 ?2304  ?BNP 3,498.1*  ? ? ? ?ProBNP (last 3 results) ?No results for input(s): PROBNP in the last 8760 hours. ? ? ? ?Other results: ? ?Imaging: ?US RENAL ? ?Result Date: 01/16/2022 ?CLINICAL DATA:  Acute kidney injury EXAM: RENAL / URINARY TRACT ULTRASOUND COMPLETE COMPARISON:  April 19, 2018 FINDINGS:  Right Kidney: Renal measurements: 9.9 x 4.9 x 5.4 cm = volume: 135.5 mL. Echogenicity within normal limits. No mass or hydronephrosis visualized. Left Kidney: Renal measurements: 10.2 x 5.6 x 6.4 cm (volume = 190 cm^3) ml. Echogenicity within normal limits. No mass or hydronephrosis visualized. Bladder: Appears normal for degree of bladder distention. Other: Bilateral small pleural effusion. IMPRESSION: Unremarkable ultrasound imaging of the kidneys for patient's age. Small bilateral pleural effusion. Electronically Signed   By: Frazier Richards M.D.   On: 01/16/2022 16:59   ? ? ?Medications:   ? ? ?Scheduled Medications: ? (feeding supplement) PROSource Plus  30 mL Oral TID BM  ? aspirin EC  81 mg Oral Daily  ? atorvastatin  80 mg Oral Daily  ? Chlorhexidine Gluconate Cloth  6 each Topical Daily  ? insulin aspart  0-15 Units Subcutaneous Q4H  ? insulin  glargine-yfgn  32 Units Subcutaneous QHS  ? multivitamin with minerals  1 tablet Oral Daily  ? ? ?Infusions: ? azithromycin Stopped (01/18/22 0258)  ? cefTRIAXone (ROCEPHIN)  IV Stopped (01/18/22 0133)  ? furosemide 120 mg (01/18/22 0859)  ? heparin 1,300 Units/hr (01/18/22 0600)  ? ? ?PRN Medications: ?acetaminophen **OR** acetaminophen, albuterol ? ? ?Assessment/Plan:  ? ?1. Shock: SBP initially 80s, now up to 100s.  He has had fluid boluses.  This is in setting of DKA.  Echo with EF 40-45% range. Most recent lactate normal, 1.6.  ?- MAPs stable in 80s. Does not require pressors.  ?- Refuses central access for monitoring ?2. Acute on chronic systolic CHF: History of ischemia CMP in 2016 post-anterior MI that recovered over time.  Echo in 8/16 with EF 50-55%.  Echo today showed EF 40-45% with apical HK, mild LVH, normal RV, dilated IVC. On exam, he does look volume overloaded and IVC is dilated on echo.  He has had gentle IV fluid. He is now off Bipap and on nasal cannula.  CXR looks like pulmonary edema. Modest diuresis with lasix 120 iv bid and metolazone. Volume status improved. CVP 8 on exam  SCR relatively stable ?- continue high-dose lasix + metolazone one more day ?- Continue TED hose ?- I doubt low output but if not responding to lasix can try to augment with low-dose dobutamine ?- Would like to have central access for CVP monitoring and co-ox +/- pressors.  He refused CVL offered by CCM.  Renal ok'd PICC, he initially refused and now wants to talk with his wife. Will place if he is agreeable => he refused this.   ?3. CAD with possible NSTEMI: Anterior STEMI in 2016, DES to LAD.  Has been off all meds x 1 month.  He denies chest pain (only dyspnea), but ECG is concerning with STE in AVR and ST depression laterally (inferior Qs were on prior ECG as well). [place] 138[address].  Possible demand ischemia with DKA and hypotension, but degree of troponin elevation is more suggestive of NSTEMI with ACS.  Echo  showed apical hypokinesis (EF 40-45%).  Unfortunately, creatinine is up to 4.29.  ?- Doubt true ACS. In absence of chest pain, would hold off on cath at this time given markedly elevated creatinine.  ?- Completed heparin gtt x 48 hours. Switch to DVT prophylaxis ?- Continue ASA 81 and atorvastatin 80 mg daily.  ?4. AKI on CKD stage IV: Baseline creatinine 2.7-3, up to 4.45 today.  Suspect due to DKA/hypotension.   ?- long talk about possible need for HD particularly if not responding to  diuretics ?- he discussed with his wife whether or not he would accept HD and says he would not want it now. I asked if that would change if it were a matter of life and death and he said maybe ?5. Hyperkalemia: In setting of DKA. improved ?6. Pulmonary: CXR with pulmonary edema.  PCT 7.73, WBCs decreasing ?- Covering empirically with ceftriaxone/azithromycin for possible PNA.  ?7. DKA: Off his home meds (insulin included) for at least 1 month. Still with anion gap acidosis and hyperkalemia.  ?- Per CCM/internal medicine.  ?8. H/o ETOH abuse: Per wife, drinks fairly heavily still.  ?9. Atrial fibrillation: Remote history of PAF with CVA.  He was not anticoagulated at that time due to ETOH abuse.  ?10. Jehovah's Witness ? ? ?Length of Stay: 2 ? ? ?Glori Bickers MD ?01/18/2022, 9:25 AM ? ?Advanced Heart Failure Team ?Pager [phone] (M-F; 7a - 4p)  ?Please contact [facility] for night-coverage after hours (4p -7a ) and weekends on [email or web address]  ?

## 2022-01-18 NOTE — Progress Notes (Signed)
? ?  Notified by RN that patient is in atrial fibrillation on telemetry upon transfer to to see earlier today.  Atrial fibrillation has persisted to this point with occasional brief runs of RVR.  Patient has not appreciated any palpitations, chest pain, worsening shortness of breath.  His heparin GTT was stopped earlier today. ? ?At this time will start an amiodarone gtt. and resume heparin gtt. for management of his atrial fibrillation.  He does have a known history of paroxysmal A-fib.  Plan reviewed with Dr. Haroldine Laws.  RN notified of change in plan. ? ?Abigail Butts, PA-C ?01/18/22; 3:57 PM ? ?

## 2022-01-18 NOTE — Plan of Care (Signed)
  Problem: Education: Goal: Ability to demonstrate management of disease process will improve Outcome: Progressing Goal: Ability to verbalize understanding of medication therapies will improve Outcome: Progressing   

## 2022-01-18 NOTE — Consult Note (Signed)
? ?NAME:  Timothy Lambert, MRN:  [id number], DOB:  Aug 14, 1956, LOS: 2 ?ADMISSION DATE:  01/15/2022, CONSULTATION DATE:  01/16/22 ?REFERRING MD:  Bonner Puna - [facility], CHIEF COMPLAINT:  Cardiogenic shock   ? ?History of Present Illness:  ?66 yo M PMH DM CAD, prior anterior STEMI, s/p DES mLAD, ICM, Afib, CVA, etoh abuse who was admitted to [facility] 4/28 with CC SOB and management of acute respiratory failure with hypoxia, DKA, AKI on CKD III. In interval this morning, there is concern for cardiogenic shock. Cardiology, Adv HF, Nephrology have been consulted and it has been requested for patient to transfer to ICU for further management of suspected cardiogenic shock after multiple low BP readings 0500 and 0600 4/28  ? ?The patient is DNR/I, and is of note a Raymond Gurney witness  ? ?He has not had IVF for his DKA including during ED course due to concern for acute HF ? ?PCCM consulted in this setting  ? ?Pertinent  Medical History  ?CAD ?STEMI ?CVA ?Afib ?ICM ?Etoh abuse  ?CKD III ? ?Significant Hospital Events: ?Including procedures, antibiotic start and stop dates in addition to other pertinent events   ?4/28 Admit to [facility] -- abx, insulin gtt, bipap. Cards, nephro, HF, PCCM consult ? ?Interim History / Subjective:  ?Did not require BiPAP overnight ?Diuresed well with current lasix dosing ?Objective   ?Blood pressure (!) 113/97, pulse 85, temperature 98 ?F (36.7 ?C), temperature source Oral, resp. rate (!) 25, height 5\' 7"  (1.702 m), weight 79.5 kg, SpO2 91 %. ?   ?   ? ?Intake/Output Summary (Last 24 hours) at 01/18/2022 0859 ?Last data filed at 01/18/2022 4481 ?Gross per 24 hour  ?Intake 959.61 ml  ?Output 1795 ml  ?Net -835.39 ml  ? ?Filed Weights  ? 01/16/22 0822 01/17/22 0600 01/18/22 0500  ?Weight: 80.4 kg 73.7 kg 79.5 kg  ? ?Physical Exam: ?General: Well-appearing, no acute distress ?HENT: [place], AT, OP clear, MMM ?Eyes: EOMI, no scleral icterus ?Respiratory: Bibasilar crackles ?Cardiovascular: RRR, -M/R/G, no JVD ?GI: BS+, soft,  nontender ?Extremities: Pedal edema, -tenderness ?Neuro: AAO x4, CNII-XII grossly intact ?Psych: Normal mood, normal affect ? ?Resolved Hospital Problem list   ? ? ?Assessment & Plan:  ? ?Acute respiratory failure with hypoxia ?Hx COPD ?-pulm edema, ?PNA ?P ?-On HFNC. Continues to require 8-10L O2 ?-BiPAP as needed ?-Continue diuresis ?-Continue CAP antibiotics ?-F/u sputum culture ? ?Acute systolic and diastolic heart failure   ?NSTEMI ?Hx CAD, hx ICM, hx STEMI s/p DES  ?pAfib, currently in NSR ?Echo with worsening EF to 40-45% compared to prior ?P ?-HF team following ?-Patient declined CVC ?-not a candidate for LHC in setting of acute illness and AKI  ?-not a candidate for advanced therapies due to poor adherence, EtOH abuse ?-on ASA, hep gtt, statin  ?-Hold BB in acute HF ? ?Cardiogenic vs hypovolemic shock - resolved ?-there was concern for possible cardiogenic shock given NSTEMI, though query if pt is intravascularly hypovolemic with DKA, without volume resuscitation ?P ?-Diuresed aggressively yesterday with ~600cc output ?-Repeat diureses per Cardiology ? ?AKI on CKD IIIb  - stable ?P ?-Nephrology consulted. No indication for dialysis at this time. Good UOP with diuresis ?-Patient plans to discuss with wife regarding dialysis in the future. Previously has declined ?-Monitor UOP/Cr ? ?DKA - resolved ?P ?-Off insulin ?-Trend CBG ?-Trend BMET, BHA ?-On DM diet ? ?Hx Etoh abuse ?P  ?-supportive care  ? ?Hyperkalemia ?Hypomagnesemia ?Pseudohyponatremia  ?P ?-trend, replace as needed  ? ?Jehovahs Witness ?-  no blood product, no albumin -- confirmed with patient 01/16/22 ? ?DNR status ?-DNR/I ?-does not wan central line or PICC ?-Palliative care consulted ? ?Best Practice (right click and "Reselect all SmartList Selections" daily)  ? ?Diet/type: Regular consistency (see orders) ?DVT prophylaxis: systemic heparin ?GI prophylaxis: N/A ?Lines: N/A ?Foley:  N/A ?Code Status:  DNR ?Last date of multidisciplinary goals of  care discussion [--] ? ? ?Critical care time: n/a  ?  ?Updated patient and discussed plan with [facility] ?OK to transfer to progressive ? ?Care Time: 50 min ? ?Rodman Pickle, M.D. ?[facility] ?01/18/2022 9:04 AM  ? ?See Amion for personal pager ?For hours between 7 PM to 7 AM, please call Elink for urgent questions ? ? ?

## 2022-01-18 NOTE — Progress Notes (Signed)
ANTICOAGULATION CONSULT NOTE ? ?Pharmacy Consult for heparin ?Indication:  r/o ACS ? ?Allergies  ?Allergen Reactions  ? Trulicity [Dulaglutide]   ?  Upset his stomach  ? ? ?Patient Measurements: ?Height: 5\' 7"  (170.2 cm) ?Weight: 79.5 kg (175 lb 4.3 oz) ?IBW/kg (Calculated) : 66.1 ? ?Vital Signs: ?Temp: 98 ?F (36.7 ?C) (04/30 6644) ?Temp Source: Oral (04/30 0347) ?BP: 113/97 (04/30 0600) ?Pulse Rate: 85 (04/30 0600) ? ?Labs: ?Recent Labs  ?  01/16/22 ?0258 01/16/22 ?0501 01/16/22 ?1321 01/16/22 ?2344 01/17/22 ?4259 01/17/22 ?5638 01/18/22 ?7564  ?HGB 10.1*  --   --  9.4*  --   --  9.4*  ?HCT 29.8*  --   --  26.9*  --   --  27.5*  ?PLT 174  --   --  184  --   --  199  ?HEPARINUNFRC  --   --    < > 0.35 0.49  --  0.49  ?CREATININE 3.93* 4.29*   < > 4.45*  --  4.49* 4.58*  ?TROPONINIHS 11,409* [id number]*  --   --   --   --   --   ? < > = values in this interval not displayed.  ? ? ? ?Estimated Creatinine Clearance: 16.3 mL/min (A) (by C-G formula based on SCr of 4.58 mg/dL (H)). ? ? ?Medical History: ?Past Medical History:  ?Diagnosis Date  ? Acute renal failure (ARF) ([place]) 02/19/2015  ? CHF (congestive heart failure) ([place])   ? Coronary artery disease   ? GERD (gastroesophageal reflux disease)   ? HLD (hyperlipidemia)   ? Hypertension   ? Myocardial infarction [facility]) 01/22/2015  ? "massive"  ? Refusal of blood transfusions as patient is Jehovah's Witness   ? Seizures ([place]) 01/22/2015  ? "in front of paramedics"  ? Stroke [facility]) 01/22/2015  ? "short term memory loss & right side weak since" (02/19/2015)  ? Type II diabetes mellitus ([place])   ? ? ?Assessment: ?66yo male c/o SOB/cough x2d, O2 sat found to be mid-70s on RA, improved with 4L [place], admitted for acute CHF, now w/ troponin resulting >13,000 >> to begin heparin for ACS. ? ?Heparin level therapeutic: 0.49 on heparin drip rate 1300 units/hr; no bleeding noted  ?HgB trend down slightly - 9.4 stable x24hr  ? ? ?Goal of Therapy:  ?Heparin level 0.3-0.7 units/ml ?Monitor platelets by  anticoagulation protocol: Yes ?  ?Plan:  ?Continue heparin gtt at 1300 units/hr ?Daily heparin level and CBC  ?Monitor s/s bleeding  ? ? ?Bonnita Nasuti Pharm.D. CPP, BCPS ?Clinical Pharmacist ?[phone] ?01/18/2022 8:45 AM  ? ?Please check AMION for all [place] numbers ? ? ?

## 2022-01-18 NOTE — Progress Notes (Signed)
ANTICOAGULATION CONSULT NOTE ? ?Pharmacy Consult for heparin ?Indication:  r/o ACS ? ?Allergies  ?Allergen Reactions  ? Trulicity [Dulaglutide]   ?  Upset his stomach  ? ? ?Patient Measurements: ?Height: 5\' 7"  (170.2 cm) ?Weight: 79.5 kg (175 lb 4.3 oz) ?IBW/kg (Calculated) : 66.1 ? ?Vital Signs: ?Temp: 98.1 ?F (36.7 ?C) (04/30 1132) ?Temp Source: Oral (04/30 1132) ?BP: 111/84 (04/30 1300) ?Pulse Rate: 95 (04/30 1300) ? ?Labs: ?Recent Labs  ?  01/16/22 ?0258 01/16/22 ?0501 01/16/22 ?1321 01/16/22 ?2344 01/17/22 ?9767 01/17/22 ?3419 01/18/22 ?3790  ?HGB 10.1*  --   --  9.4*  --   --  9.4*  ?HCT 29.8*  --   --  26.9*  --   --  27.5*  ?PLT 174  --   --  184  --   --  199  ?HEPARINUNFRC  --   --    < > 0.35 0.49  --  0.49  ?CREATININE 3.93* 4.29*   < > 4.45*  --  4.49* 4.58*  ?TROPONINIHS 11,409* [id number]*  --   --   --   --   --   ? < > = values in this interval not displayed.  ? ? ? ?Estimated Creatinine Clearance: 16.3 mL/min (A) (by C-G formula based on SCr of 4.58 mg/dL (H)). ? ? ?Medical History: ?Past Medical History:  ?Diagnosis Date  ? Acute renal failure (ARF) ([place]) 02/19/2015  ? CHF (congestive heart failure) ([place])   ? Coronary artery disease   ? GERD (gastroesophageal reflux disease)   ? HLD (hyperlipidemia)   ? Hypertension   ? Myocardial infarction [facility]) 01/22/2015  ? "massive"  ? Refusal of blood transfusions as patient is Jehovah's Witness   ? Seizures ([place]) 01/22/2015  ? "in front of paramedics"  ? Stroke [facility]) 01/22/2015  ? "short term memory loss & right side weak since" (02/19/2015)  ? Type II diabetes mellitus ([place])   ? ? ?Assessment: ?66 yo male c/o SOB/cough x2d, O2 sat found to be mid-70s on RA, improved with 4L [place], admitted for acute CHF, now w/ troponin resulting >13,000 >> to begin heparin for ACS. ? ?Heparin stopped this AM; but then pt developed afib, and pharmacy asked to resume IV heparin. ? ?Goal of Therapy:  ?Heparin level 0.3-0.7 units/ml ?Monitor platelets by anticoagulation protocol: Yes ?   ?Plan:  ?Resume heparin gtt at 1300 units/hr. ?Daily heparin level and CBC. ?Monitor s/s bleeding  ? ?N[place] Crane, Pharm D, BCPS, BCCP ?Clinical Pharmacist ? 01/18/2022 4:32 PM  ? ?[facility] pharmacy phone numbers are listed on [email or web address] ? ? ? ?

## 2022-01-18 NOTE — Evaluation (Signed)
Occupational Therapy Evaluation ?Patient Details ?Name: Timothy Lambert ?MRN: [id number] ?DOB: 22-May-1956 ?Today's Date: 01/18/2022 ? ? ?History of Present Illness Pt is a 66 y.o. male who presented 01/15/22 with SOB and positive home COVID test however his COVID and flu PCR here were negative. Pt admitted with acute respiratory failure with hypoxia secondary to acute on chronic systolic CHF, NSTEMI, and AKI on CKD 4. Transferred to ICU 4/28 secondary to hypotension in setting of DKA. PMH: diastolic dysfunction per 2D echo done in 2016, CAD, CVA, paroxysmal atrial fibrillation, DM, HTN, MI, seizures, CHF  ? ?Clinical Impression ?  ?Pt PTA: Pt living with spouse at home, both still work outside of the home. Pt reports independence. Pt currently,  limited by decreased activity tolerance and assistance for stability with mobility and use of RW, mostly minguardA for balance. Pt standing x51mins at sink for grooming with supervisionA. Pt's O2 on 8-10 L HFNC throughout with O2 sats 86%-96% with exertion and no signs of dyspnea. Pt set-upA to minguardA overall. Pt would benefit from continued OT skilled services. OT following acutely. ? ?   ? ?Recommendations for follow up therapy are one component of a multi-disciplinary discharge planning process, led by the attending physician.  Recommendations may be updated based on patient status, additional functional criteria and insurance authorization.  ? ?Follow Up Recommendations ? Home health OT  ?  ?Assistance Recommended at Discharge Intermittent Supervision/Assistance  ?Patient can return home with the following A little help with walking and/or transfers;A little help with bathing/dressing/bathroom;Assistance with cooking/housework;Assist for transportation ? ?  ?Functional Status Assessment ? Patient has had a recent decline in their functional status and demonstrates the ability to make significant improvements in function in a reasonable and predictable amount of time.   ?Equipment Recommendations ? None recommended by OT  ?  ?Recommendations for Other Services   ? ? ?  ?Precautions / Restrictions Precautions ?Precautions: Fall;Other (comment) ?Precaution Comments: watch SpO2 ?Restrictions ?Weight Bearing Restrictions: No  ? ?  ? ?Mobility Bed Mobility ?Overal bed mobility: Modified Independent ?  ?  ?  ?  ?  ?  ?General bed mobility comments: Pt able to transition supine > sit EOB with HOB elevated without assistance. ?  ? ?Transfers ?Overall transfer level: Needs assistance ?Equipment used: None ?Transfers: Sit to/from Stand ?Sit to Stand: Min guard ?  ?  ?  ?  ?  ?General transfer comment: Min guard assist for safety, mild unsteadiness noted with trunk sway, no LOB. ?  ? ?  ?Balance Overall balance assessment: Needs assistance ?Sitting-balance support: No upper extremity supported, Feet supported ?Sitting balance-Leahy Scale: Good ?  ?  ?Standing balance support: No upper extremity supported, During functional activity ?Standing balance-Leahy Scale: Poor ?Standing balance comment: Pt with LOB to R, needing minA for stability with standing mobility. ?  ?  ?  ?  ?  ?  ?  ?  ?  ?  ?  ?   ? ?ADL either performed or assessed with clinical judgement  ? ?ADL Overall ADL's : Needs assistance/impaired ?Eating/Feeding: Modified independent;Sitting ?  ?Grooming: Supervision/safety;Standing ?Grooming Details (indicate cue type and reason): standing x10 mins at sink for grooming. ?Upper Body Bathing: Supervision/ safety;Standing ?  ?Lower Body Bathing: Sitting/lateral leans;Sit to/from stand;Cueing for safety ?  ?Upper Body Dressing : Set up;Sitting ?  ?Lower Body Dressing: Min guard;Sitting/lateral leans;Sit to/from stand;Cueing for safety ?Lower Body Dressing Details (indicate cue type and reason): able to reach feet ?Toilet Transfer: Min  guard;Rolling walker (2 wheels) ?  ?Toileting- Clothing Manipulation and Hygiene: Supervision/safety;Sitting/lateral lean;Sit to/from stand ?  ?  ?   ?Functional mobility during ADLs: Min guard;Rolling walker (2 wheels) ?General ADL Comments: Pt limited by decreased activity tolerance and assistance for stability with mobility and use of RW. Pt's O2 on 8-10 L throughout with O2 sats 86%-96% with exertion and no signs of dyspnea.  ? ? ? ?Vision Baseline Vision/History: 1 Wears glasses ?Ability to See in Adequate Light: 0 Adequate ?Patient Visual Report: No change from baseline ?Vision Assessment?: No apparent visual deficits  ?   ?Perception   ?  ?Praxis   ?  ? ?Pertinent Vitals/Pain Pain Assessment ?Pain Assessment: No/denies pain ?Faces Pain Scale: No hurt  ? ? ? ?Hand Dominance Right ?  ?Extremity/Trunk Assessment Upper Extremity Assessment ?Upper Extremity Assessment: Overall WFL for tasks assessed ?  ?Lower Extremity Assessment ?Lower Extremity Assessment: Overall WFL for tasks assessed ?  ?Cervical / Trunk Assessment ?Cervical / Trunk Assessment: Normal ?  ?Communication Communication ?Communication: No difficulties ?  ?Cognition Arousal/Alertness: Awake/alert ?Behavior During Therapy: [facility] for tasks assessed/performed ?Overall Cognitive Status: Within Functional Limits for tasks assessed ?  ?  ?  ?  ?  ?  ?  ?  ?  ?  ?  ?  ?  ?  ?  ?  ?  ?  ?  ?General Comments  Pt's O2 on 8-10 L throughout with O2 sats 86%-96% with exertion and no signs of dyspnea. ? ?  ?Exercises Exercises: Other exercises ?Other Exercises ?Other Exercises: incentive spirometer x5 ?  ?Shoulder Instructions    ? ? ?Home Living Family/patient expects to be discharged to:: Private residence ?Living Arrangements: Spouse/significant other ?Available Help at Discharge: Family;Available 24 hours/day (sister who is retired can assist while wife is at work) ?Type of Home: House ?Home Access: Stairs to enter ?Entrance Stairs-Number of Steps: 2 ?Entrance Stairs-Rails: None ?Home Layout:  (x1 step between sides of house inside) ?  ?  ?Bathroom Shower/Tub: Tub/shower unit ?  ?Bathroom Toilet:  Standard ?  ?  ?Home Equipment: Conservation officer, nature (2 wheels);Cane - quad;Cane - single point;Wheelchair - manual ?  ?  ?  ? ?  ?Prior Functioning/Environment Prior Level of Function : Independent/Modified Independent;Driving;Working/employed ?  ?  ?  ?  ?  ?  ?Mobility Comments: Does not use AD. ?ADLs Comments: Works as a Scientist, water quality in a store. Tries not to drive at night due to vision issues at night. ?  ? ?  ?  ?OT Problem List: Decreased strength;Decreased activity tolerance;Impaired balance (sitting and/or standing);Cardiopulmonary status limiting activity ?  ?   ?OT Treatment/Interventions: Self-care/ADL training;Therapeutic exercise;Energy conservation;DME and/or AE instruction;Therapeutic activities;Patient/family education;Balance training  ?  ?OT Goals(Current goals can be found in the care plan section) Acute Rehab OT Goals ?Patient Stated Goal: to get off O2 ?OT Goal Formulation: With patient ?Time For Goal Achievement: 02/01/22 ?Potential to Achieve Goals: Good ?ADL Goals ?Additional ADL Goal #1: Pt will perform x15 mins of OOB ADL with O2 sats >92%. ?Additional ADL Goal #2: pt will state 3 energy conservation techniques in order to increase independence with ADL and mobility.  ?OT Frequency: Min 2X/week ?  ? ?Co-evaluation   ?  ?  ?  ?  ? ?  ?AM-PAC OT "6 Clicks" Daily Activity     ?Outcome Measure Help from another person eating meals?: None ?Help from another person taking care of personal grooming?: A Little ?Help from another person  toileting, which includes using toliet, bedpan, or urinal?: A Little ?Help from another person bathing (including washing, rinsing, drying)?: A Little ?Help from another person to put on and taking off regular upper body clothing?: A Little ?Help from another person to put on and taking off regular lower body clothing?: A Little ?6 Click Score: 19 ?  ?End of Session Equipment Utilized During Treatment: Gait belt;Rolling walker (2 wheels);Oxygen ?Nurse Communication: Mobility  status ? ?Activity Tolerance: Patient tolerated treatment well;No increased pain ?Patient left: in chair;with call bell/phone within reach ? ?OT Visit Diagnosis: Unsteadiness on feet (R26.81);Muscle weakness (generali

## 2022-01-18 NOTE — Progress Notes (Signed)
?                                                                                                                                                     ?                                                   ?Palliative Medicine Progress Note  ? ?Patient Name: Timothy Lambert       Date: 01/18/2022 ?DOB: 1955/09/29  Age: 66 y.o. MRN#: [id number] ?Attending Physician: Timothy Seeds, MD ?Primary Care Physician: Timothy Pier, MD ?Admit Date: 01/15/2022 ? ?Reason for Consultation/Follow-up: Establishing goals of care ? ?HPI/Patient Profile: ?66 y.o. male  with past medical history of chronic systolic CHF, CAD, prior STEMI in 2016 s/p DES to LAD, ICM, paroxysmal atrial fibrillation, CKD stage III, and diabetes mellitus type 2 who presented to the emergency department on 01/15/2022 with shortness of breath.  In the ED, patient was hypoxic and required BiPAP.  Chest x-ray showed congestion consistent with CHF with some concern for pneumonia.  Glucose 422, creatinine 3.79 and BNP elevated at 3400. Troponin > 11,000.  ?He was initially admitted to [facility] with acute respiratory failure with hypoxia secondary to acute CHF, DKA, and AKI on CKD.  ?PCCM and advanced heart failure consulted due to concern for cardiogenic shock.  ? ?Subjective: ?Chart reviewed.  Note that creatinine up to 4.58 (from 4.49 yesterday). ? ?Patient has been transferred out of ICU.  His RN reports that he went into A-fib with RVR and she is starting an amiodarone infusion.  Patient is sitting up in bed, preparing to eat dinner.  He tells me he feels "pretty good".  He denies chest pain or shortness of breath.  He is on 8 L oxygen by nasal cannula. ? ?We discussed that his renal function is slightly worse today.  We again discussed the possibility of needing dialysis in order to prolong his life.  He states that in a matter of life and death he would consider dialysis, but does not think he would end up doing it. ? ?I spoke with his wife Timothy Lambert by phone an  provided a brief update, emphasizing that Timothy Lambert's renal function is slightly worse today. Discussed that he has not yet made a definitive decision regarding dialysis, but needs to do so soon in case he becomes uremic.  ? ?Discussed the importance of continued conversation with patient, family and the medical teamregarding overall plan of care and treatment options, ensuring decisions are within the context of the patients values and GOCs. ? ? ?Objective: ? ?Physical Exam ?Vitals reviewed.  ?Constitutional:   ?   General: He is not  in acute distress. ?   Appearance: He is ill-appearing.  ?Cardiovascular:  ?   Rate and Rhythm: Tachycardia present.  ?Pulmonary:  ?   Effort: Pulmonary effort is normal.  ?Neurological:  ?   Mental Status: He is alert and oriented to person, place, and time.  ?         ? ?Vital Signs: BP 111/84   Pulse 95   Temp 98.1 ?F (36.7 ?C) (Oral)   Resp (!) 24   Ht 5\' 7"  (1.702 m)   Wt 79.5 kg   SpO2 93%   BMI 27.45 kg/m?  ?SpO2: SpO2: 93 % ?O2 Device: O2 Device: High Flow Nasal Cannula ?O2 Flow Rate: O2 Flow Rate (L/min): 8 L/min ? ? ? ?LBM: Last BM Date : 01/16/22 (per patient) ? ?   ?Palliative Assessment/Data: PPS 50% ? ? ? ? ?Palliative Medicine Assessment & Plan  ? ?Assessment: ?Principal Problem: ?  Acute on chronic systolic CHF (congestive heart failure) ([place]) ?Active Problems: ?  Non-ST elevation (NSTEMI) myocardial infarction [facility]) ?  Essential hypertension ?  CAD (coronary artery disease) ?  AKI (acute kidney injury) ([place]) ?  Paroxysmal atrial fibrillation (HCC) ?  CKD (chronic kidney disease) stage 3, GFR 30-59 ml/min (HCC) ?  Diabetes ([place]) ?  Acute respiratory failure with hypoxia ([place]) ?  Acute respiratory failure with hypoxemia (HCC) ?  ? ?Recommendations/Plan: ?DNR/DNI as previously documented ?Continue all current interventions with watchful waiting ?Patient and family are hopeful for improvement ?Patient has not made a definitive decision on whether he would do dialysis  if needed to prolong his life ?His ultimate goal of care is to return home ? ?I am off service until Wednesday 5/3. If there are urgent needs, please call PMT phone at [phone].  ? ?Goals of Care and Additional Recommendations: ?Limitations on Scope of Treatment: No Blood Transfusions (Jehovah's Witness) ? ?Prognosis: ? Unable to determine ? ?Discharge Planning: ?To Be Determined ? ? ?Thank you for allowing the Palliative Medicine Team to assist in the care of this patient. ? ? ?MDM - moderate ? ? ?Lavena Bullion, NP ? ? ?Please contact Palliative Medicine Team phone at [phone] for questions and concerns.  ?For individual providers, please see AMION. ? ? ? ? ? ?

## 2022-01-18 NOTE — Progress Notes (Signed)
Inpatient Diabetes Program Recommendations ? ?AACE/ADA: New Consensus Statement on Inpatient Glycemic Control  ? ?Target Ranges:  Prepandial:   less than 140 mg/dL ?     Peak postprandial:   less than 180 mg/dL (1-2 hours) ?     Critically ill patients:  140 - 180 mg/dL  ? ? Latest Reference Range & Units 01/18/22 03:30 01/18/22 06:39  ?Glucose-Capillary 70 - 99 mg/dL 71 71  ? ? Latest Reference Range & Units 01/17/22 06:52 01/17/22 12:03 01/17/22 16:53 01/17/22 19:49 01/17/22 23:08  ?Glucose-Capillary 70 - 99 mg/dL 162 (H) 234 (H) 258 (H) 207 (H) 150 (H)  ? ?Review of Glycemic Control ? ?Diabetes history: DM2 ?Outpatient Diabetes medications: Lantus 32 units daily ?Current orders for Inpatient glycemic control: Semglee 32 units QHS, Novolog 0-15 units Q4H ?  ?Inpatient Diabetes Program Recommendations:   ?  ?Insulin: Please consider decreasing Semglee to 27 units QHS. ? ?Thanks, ?Barnie Alderman, RN, MSN, CDE ?Diabetes Coordinator ?Inpatient Diabetes Program ?[phone] (Team Pager from 8am to 5pm) ? ?

## 2022-01-19 ENCOUNTER — Inpatient Hospital Stay (HOSPITAL_COMMUNITY): Payer: Medicare Other

## 2022-01-19 ENCOUNTER — Other Ambulatory Visit: Payer: Medicare Other

## 2022-01-19 ENCOUNTER — Other Ambulatory Visit (HOSPITAL_COMMUNITY): Payer: Self-pay

## 2022-01-19 DIAGNOSIS — I5023 Acute on chronic systolic (congestive) heart failure: Secondary | ICD-10-CM | POA: Diagnosis not present

## 2022-01-19 LAB — HEPARIN LEVEL (UNFRACTIONATED)
Heparin Unfractionated: 0.3 IU/mL (ref 0.30–0.70)
Heparin Unfractionated: 0.16 IU/mL — ABNORMAL LOW (ref 0.30–0.70)

## 2022-01-19 LAB — PTH, INTACT AND CALCIUM
Calcium, Total (PTH): 8.4 mg/dL — ABNORMAL LOW (ref 8.6–10.2)
PTH: 262 pg/mL — ABNORMAL HIGH (ref 15–65)

## 2022-01-19 LAB — BASIC METABOLIC PANEL
Anion gap: 14 (ref 5–15)
BUN: 116 mg/dL — ABNORMAL HIGH (ref 8–23)
CO2: 19 mmol/L — ABNORMAL LOW (ref 22–32)
Calcium: 8.5 mg/dL — ABNORMAL LOW (ref 8.9–10.3)
Chloride: 100 mmol/L (ref 98–111)
Creatinine, Ser: 4.36 mg/dL — ABNORMAL HIGH (ref 0.61–1.24)
GFR, Estimated: 14 mL/min — ABNORMAL LOW (ref >60)
Glucose, Bld: 133 mg/dL — ABNORMAL HIGH (ref 70–99)
Potassium: 4.1 mmol/L (ref 3.5–5.1)
Sodium: 133 mmol/L — ABNORMAL LOW (ref 135–145)

## 2022-01-19 LAB — PHOSPHORUS: Phosphorus: 4.7 mg/dL — ABNORMAL HIGH (ref 2.5–4.6)

## 2022-01-19 LAB — GLUCOSE, CAPILLARY
Glucose-Capillary: 140 mg/dL — ABNORMAL HIGH (ref 70–99)
Glucose-Capillary: 151 mg/dL — ABNORMAL HIGH (ref 70–99)
Glucose-Capillary: 180 mg/dL — ABNORMAL HIGH (ref 70–99)
Glucose-Capillary: 181 mg/dL — ABNORMAL HIGH (ref 70–99)
Glucose-Capillary: 70 mg/dL (ref 70–99)
Glucose-Capillary: 85 mg/dL (ref 70–99)
Glucose-Capillary: 97 mg/dL (ref 70–99)

## 2022-01-19 LAB — MAGNESIUM: Magnesium: 1.9 mg/dL (ref 1.7–2.4)

## 2022-01-19 MED ORDER — INSULIN GLARGINE-YFGN 100 UNIT/ML ~~LOC~~ SOLN
25.0000 [IU] | Freq: Every day | SUBCUTANEOUS | Status: DC
  Administered 2022-01-19: 25 [IU] via SUBCUTANEOUS
  Filled 2022-01-19 (×2): qty 0.25

## 2022-01-19 MED ORDER — METOLAZONE 2.5 MG PO TABS
2.5000 mg | ORAL_TABLET | Freq: Once | ORAL | Status: AC
  Administered 2022-01-19: 2.5 mg via ORAL
  Filled 2022-01-19: qty 1

## 2022-01-19 MED ORDER — ENSURE MAX PROTEIN PO LIQD
11.0000 [oz_av] | Freq: Two times a day (BID) | ORAL | Status: DC
  Administered 2022-01-19 – 2022-01-22 (×7): 11 [oz_av] via ORAL
  Filled 2022-01-19 (×8): qty 330

## 2022-01-19 NOTE — Progress Notes (Signed)
ANTICOAGULATION CONSULT NOTE ? ?Pharmacy Consult for heparin ?Indication: Afib ? ?Allergies  ?Allergen Reactions  ? Trulicity [Dulaglutide]   ?  Upset his stomach  ? ? ?Patient Measurements: ?Height: 5\' 7"  (170.2 cm) ?Weight: 79 kg (174 lb 2.6 oz) ?IBW/kg (Calculated) : 66.1 ? ?Vital Signs: ?Temp: 98.2 ?F (36.8 ?C) (05/01 0420) ?Temp Source: Oral (05/01 0420) ?BP: 140/72 (05/01 1100) ?Pulse Rate: 82 (05/01 1100) ? ?Labs: ?Recent Labs  ?  01/16/22 ?2344 01/17/22 ?7588 01/17/22 ?3254 01/18/22 ?9826 01/19/22 ?0034 01/19/22 ?1034  ?HGB 9.4*  --   --  9.4*  --   --   ?HCT 26.9*  --   --  27.5*  --   --   ?PLT 184  --   --  199  --   --   ?HEPARINUNFRC 0.35   < >  --  0.49 0.30 0.16*  ?CREATININE 4.45*  --  4.49* 4.58* 4.36*  --   ? < > = values in this interval not displayed.  ? ? ? ?Estimated Creatinine Clearance: 15.8 mL/min (A) (by C-G formula based on SCr of 4.36 mg/dL (H)). ? ? ?Medical History: ?Past Medical History:  ?Diagnosis Date  ? Acute renal failure (ARF) ([place]) 02/19/2015  ? CHF (congestive heart failure) ([place])   ? Coronary artery disease   ? GERD (gastroesophageal reflux disease)   ? HLD (hyperlipidemia)   ? Hypertension   ? Myocardial infarction [facility]) 01/22/2015  ? "massive"  ? Refusal of blood transfusions as patient is Jehovah's Witness   ? Seizures ([place]) 01/22/2015  ? "in front of paramedics"  ? Stroke [facility]) 01/22/2015  ? "short term memory loss & right side weak since" (02/19/2015)  ? Type II diabetes mellitus ([place])   ? ? ?Assessment: ?66 yo male c/o SOB/cough x2d, O2 sat found to be mid-70s on RA, improved with 4L [place], admitted for acute CHF, now w/ troponin resulting >13,000 >> to begin heparin for ACS. ? ?Heparin stopped over weekend  but then pt developed afib, and pharmacy asked to resume IV heparin. ? ?Heparin level 0.17 on heparin drip 1300 uts/hr  ?Cbc stable  ? ?Goal of Therapy:  ?Heparin level 0.3-0.7 units/ml ?Monitor platelets by anticoagulation protocol: Yes ?  ?Plan:  ?Increase heparin drip 1450   units/hr. ?Daily heparin level and CBC. ?Monitor s/s bleeding  ? ? ? ? ?Bonnita Nasuti Pharm.D. CPP, BCPS ?Clinical Pharmacist ?[phone] ?01/19/2022 2:52 PM  ? ?Please check AMION for all [place] numbers ? ? ? ? ?

## 2022-01-19 NOTE — Progress Notes (Signed)
ANTICOAGULATION CONSULT NOTE ? ?Pharmacy Consult for heparin ?Indication:  r/o ACS ? ?Allergies  ?Allergen Reactions  ? Trulicity [Dulaglutide]   ?  Upset his stomach  ? ? ?Patient Measurements: ?Height: 5\' 7"  (170.2 cm) ?Weight: 79.5 kg (175 lb 4.3 oz) ?IBW/kg (Calculated) : 66.1 ? ?Vital Signs: ?Temp: 98.2 ?F (36.8 ?C) (05/01 0003) ?Temp Source: Oral (05/01 0003) ?BP: 114/78 (05/01 0003) ?Pulse Rate: 75 (05/01 0003) ? ?Labs: ?Recent Labs  ?  01/16/22 ?0258 01/16/22 ?0501 01/16/22 ?1321 01/16/22 ?2344 01/17/22 ?0947 01/17/22 ?0962 01/18/22 ?8366 01/19/22 ?0034  ?HGB 10.1*  --   --  9.4*  --   --  9.4*  --   ?HCT 29.8*  --   --  26.9*  --   --  27.5*  --   ?PLT 174  --   --  184  --   --  199  --   ?HEPARINUNFRC  --   --    < > 0.35 0.49  --  0.49 0.30  ?CREATININE 3.93* 4.29*   < > 4.45*  --  4.49* 4.58* 4.36*  ?TROPONINIHS 11,409* [id number]*  --   --   --   --   --   --   ? < > = values in this interval not displayed.  ? ? ? ?Estimated Creatinine Clearance: 17.1 mL/min (A) (by C-G formula based on SCr of 4.36 mg/dL (H)). ? ? ?Medical History: ?Past Medical History:  ?Diagnosis Date  ? Acute renal failure (ARF) ([place]) 02/19/2015  ? CHF (congestive heart failure) ([place])   ? Coronary artery disease   ? GERD (gastroesophageal reflux disease)   ? HLD (hyperlipidemia)   ? Hypertension   ? Myocardial infarction [facility]) 01/22/2015  ? "massive"  ? Refusal of blood transfusions as patient is Jehovah's Witness   ? Seizures ([place]) 01/22/2015  ? "in front of paramedics"  ? Stroke [facility]) 01/22/2015  ? "short term memory loss & right side weak since" (02/19/2015)  ? Type II diabetes mellitus ([place])   ? ? ?Assessment: ?66 yo male c/o SOB/cough x2d, O2 sat found to be mid-70s on RA, improved with 4L [place], admitted for acute CHF, now w/ troponin resulting >13,000 >> to begin heparin for ACS. ? ?Heparin stopped this AM; but then pt developed afib, and pharmacy asked to resume IV heparin. ? ?Heparin level:0.30 but previously therapeutic on this rate.  Will leave rate alone and allow time to accumulate given recent restart without bolus and CrCl ~85mL/min. No reported infusion issues or overt bleeding. ? ?Goal of Therapy:  ?Heparin level 0.3-0.7 units/ml ?Monitor platelets by anticoagulation protocol: Yes ?  ?Plan:  ?Continue heparin gtt at 1300 units/hr. ?Daily heparin level and CBC. ?Monitor s/s bleeding  ? ?Georga Bora, PharmD ?Clinical Pharmacist ?01/19/2022 2:09 AM ?Please check AMION for all [place] numbers ? ? ? ? ?

## 2022-01-19 NOTE — Progress Notes (Signed)
Inpatient Diabetes Program Recommendations ? ?AACE/ADA: New Consensus Statement on Inpatient Glycemic Control ? ?Target Ranges:  Prepandial:   less than 140 mg/dL ?     Peak postprandial:   less than 180 mg/dL (1-2 hours) ?     Critically ill patients:  140 - 180 mg/dL  ? ? Latest Reference Range & Units 01/19/22 00:30 01/19/22 04:23 01/19/22 07:56 01/19/22 11:43  ?Glucose-Capillary 70 - 99 mg/dL 140 (H) 70 85 97  ? ? Latest Reference Range & Units 01/18/22 06:39 01/18/22 11:30 01/18/22 16:44 01/18/22 20:40  ?Glucose-Capillary 70 - 99 mg/dL 71 151 (H) 138 (H) 170 (H)  ? ? Latest Reference Range & Units 12/30/21 13:49  ?Hemoglobin A1C 4.8 - 5.6 % 14.6 (H)  ? ?Review of Glycemic Control ? ?Diabetes history: DM2 ?Outpatient Diabetes medications: Lantus 32 units QAM ?Current orders for Inpatient glycemic control: Semglee 25 units QHS, Novolog 0-15 units Q4H ?  ?Inpatient Diabetes Program Recommendations:   ?  ?Insulin: Noted Semglee 32 units given last night and Semglee has been decreased to 25 units QHS. ?  ?HbgA1C:  A1C 14.6% on 12/30/21 indicating an average glucose of 372 mg/dl over the past 2-3 months. ? ?NOTE: Noted patient seen Cari Mayers, PA-C on 12/30/21 and per office note "States today that he has been using the Lantus 28 units during the day.  Does also endorse that he has not used any medication for the last "2 to 3 weeks".  States that he did restart it yesterday. Wife is present and states that it seems like he had not been taking the medication longer than he says. Glucose in clinic 219.  Patient is a very reluctant historian, states that he believes the only reason his A1c went from 7.1 to over 13 in a 105-month.  Was because he was out of his medication for 2 to 3 weeks.  No refills needed today."  Per H&P on 01/16/22, patient admitted with acute respiratory failure with hypoxia. Initial glucose of 422 mg/dl on 01/15/22 and glucose up to 503 mg/dl at 8:17 on 01/16/22 and patient started on IV insulin which  was transitioned to SQ insulin.   ? ?Spoke with patient at bedside about diabetes and home regimen for diabetes control. Patient reports being followed by PCP for diabetes management and takes Lantus 32 units QAM for diabetes control. Patient admits that he has not been taking Lantus lately. Noted patient has not been taking Lantus when he seen PCP on 12/30/21 but patient had reported resuming Lantus on 12/29/21. Asked patient if he has taken any Lantus since seeing PCP on 12/30/21 and patient states he has not been able to take it. Inquired about why he has been unable to take it and he shrugged his shoulders and said "I don't know, just didn't take it."  Patient states that he does not have any Lantus pens at home but he has a prescription that the pharmacy and needs to go pick up the Lantus when he gets discharged. Patient reports checking glucose rarely but states it is usually in the 100's in the mornings and goes up after eating.   Reviewed A1C results (14.6% on 12/30/21) and explained that current A1C indicates an average glucose of 372 mg/dl over the past 2-3 months. Discussed glucose and A1C goals. Discussed importance of checking CBGs and maintaining good CBG control to prevent long-term and short-term complications. Explained how hyperglycemia leads to damage within blood vessels which lead to the common complications seen with  uncontrolled diabetes. Stressed to the patient the importance of improving glycemic control to prevent further complications from uncontrolled diabetes. Discussed impact of nutrition, exercise, stress, sickness, and medications on diabetes control.  Discussed carbohydrates, carbohydrate goals per day and meal, along with portion sizes. Encouraged patient to be sure to pick up his Lantus from the pharmacy and start taking it every day as prescribed. Also encouraged patient to start checking glucose at least 2 times per day and follow up with PCP regarding DM control. Patient verbalized  understanding of information discussed and reports no further questions at this time related to diabetes. ? ?Thanks, ?Barnie Alderman, RN, MSN, CDE ?Diabetes Coordinator ?Inpatient Diabetes Program ?[phone] (Team Pager) ? ?

## 2022-01-19 NOTE — Progress Notes (Signed)
Patient ID: Timothy Lambert, male   DOB: [birth year], 66 y.o.   MRN: [id number] ? ? ? ?Advanced Heart Failure Rounding Note ? ? ?Subjective:   ? ?Given lasix 120 iv bid yesterday + metolazone 5 -> I/Os net negative 1591.   ? ?Weight down 1 pound. Remains on 6L HFNC. BP stable.  ? ?Breathing is better overall, no chest pain.  ? ?Scr 4.49 -> 4.45 -> 4.59 -> 4.36 ? ?Patient went into atrial fibrillation yesterday pm, amiodarone gtt started and now back in NSR.  ? ? ? ?Objective:   ?Weight Range: ? ?Vital Signs:   ?Temp:  [98 ?F (36.7 ?C)-98.2 ?F (36.8 ?C)] 98.2 ?F (36.8 ?C) (05/01 0420) ?Pulse Rate:  [70-112] 70 (05/01 0420) ?Resp:  [18-25] 18 (05/01 0700) ?BP: (111-129)/(68-103) 118/74 (05/01 0700) ?SpO2:  [90 %-95 %] 95 % (05/01 0420) ?Weight:  [79 kg] 79 kg (05/01 0420) ?Last BM Date : 01/16/22 ? ?Weight change: ?Filed Weights  ? 01/17/22 0600 01/18/22 0500 01/19/22 0420  ?Weight: 73.7 kg 79.5 kg 79 kg  ? ? ?Intake/Output:  ? ?Intake/Output Summary (Last 24 hours) at 01/19/2022 0825 ?Last data filed at 01/19/2022 0749 ?Gross per 24 hour  ?Intake 534.41 ml  ?Output 2600 ml  ?Net -2065.59 ml  ?  ? ?Physical Exam: ?General: NAD ?Neck: JVP 9-10 cm, no thyromegaly or thyroid nodule.  ?Lungs: Coarse BS at bases.  ?CV: Nondisplaced PMI.  Heart regular S1/S2, no S3/S4, no murmur.  No peripheral edema.   ?Abdomen: Soft, nontender, no hepatosplenomegaly, no distention.  ?Skin: Intact without lesions or rashes.  ?Neurologic: Alert and oriented x 3.  ?Psych: Normal affect. ?Extremities: No clubbing or cyanosis.  ?HEENT: Normal.  ? ?Telemetry: Atrial fibrillation yesterday pm, now sinus 80-90s Personally reviewed ? ?Labs: ?Basic Metabolic Panel: ?Recent Labs  ?Lab 01/16/22 ?0258 01/16/22 ?0501 01/16/22 ?1321 01/16/22 ?2344 01/17/22 ?4098 01/18/22 ?1191 01/19/22 ?0034  ?NA  --    < > 136 134* 135 136 133*  ?K  --    < > 4.7 4.8 4.7 4.3 4.1  ?CL  --    < > 105 104 104 105 100  ?CO2  --    < > 16* 18* 18* 20* 19*  ?GLUCOSE  --    < >  210* 235* 192* 81 133*  ?BUN  --    < > 79* 89* 94* 114* 116*  ?CREATININE 3.93*   < > 4.49* 4.45* 4.49* 4.58* 4.36*  ?CALCIUM  --    < > 8.8* 8.7* 8.7* 8.5* 8.5*  ?MG 1.6*  --   --  1.8 1.8 1.9 1.9  ?PHOS  --   --   --  4.3  --  5.3* 4.7*  ? < > = values in this interval not displayed.  ? ? ?Liver Function Tests: ?Recent Labs  ?Lab 01/16/22 ?0501  ?AST 58*  ?ALT 65*  ?ALKPHOS 46  ?BILITOT 1.4*  ?PROT 6.0*  ?ALBUMIN 3.0*  ? ?No results for input(s): LIPASE, AMYLASE in the last 168 hours. ?No results for input(s): AMMONIA in the last 168 hours. ? ?CBC: ?Recent Labs  ?Lab 01/15/22 ?2304 01/16/22 ?0215 01/16/22 ?0258 01/16/22 ?2344 01/18/22 ?4782  ?WBC 13.7*  --  10.2 14.8* 12.5*  ?HGB 11.2* 8.8* 10.1* 9.4* 9.4*  ?HCT 33.4* 26.0* 29.8* 26.9* 27.5*  ?MCV 88.4  --  87.6 85.9 86.5  ?PLT 185  --  174 184 199  ? ? ?Cardiac Enzymes: ?No results for input(s): CKTOTAL, CKMB,  CKMBINDEX, TROPONINI in the last 168 hours. ? ?BNP: ?BNP (last 3 results) ?Recent Labs  ?  01/15/22 ?2304  ?BNP 3,498.1*  ? ? ?ProBNP (last 3 results) ?No results for input(s): PROBNP in the last 8760 hours. ? ? ? ?Other results: ? ?Imaging: ?No results found. ? ? ?Medications:   ? ? ?Scheduled Medications: ? (feeding supplement) PROSource Plus  30 mL Oral TID BM  ? aspirin EC  81 mg Oral Daily  ? atorvastatin  80 mg Oral Daily  ? Chlorhexidine Gluconate Cloth  6 each Topical Daily  ? insulin aspart  0-15 Units Subcutaneous Q4H  ? insulin glargine-yfgn  25 Units Subcutaneous QHS  ? multivitamin with minerals  1 tablet Oral Daily  ? ? ?Infusions: ? amiodarone 30 mg/hr (01/18/22 2241)  ? azithromycin 500 mg (01/19/22 0214)  ? cefTRIAXone (ROCEPHIN)  IV 2 g (01/19/22 0120)  ? furosemide 120 mg (01/18/22 1833)  ? heparin 1,300 Units/hr (01/18/22 2106)  ? ? ?PRN Medications: ?acetaminophen **OR** acetaminophen, albuterol ? ? ?Assessment/Plan:  ? ?1. Shock: SBP initially 80s, now up to 100s.  He has had fluid boluses.  This is in setting of DKA.  Refused central  access.  Echo with EF 40-45% range. No resolved.  ?2. 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  Echo in 8/16 with EF 50-55%.  Echo this admission showed EF 40-45% with apical HK, mild LVH, normal RV, dilated IVC. Picture complicated by AKI.  Has been getting Lasix 120 mg bid + metolazone, modest diuresis yesterday.  Still with at least mild volume overload by exam and still on 6L HFNC.  Breathing is improving. Creatinine mildly lower at 4.36.  ?- continue Lasix 120 mg bid + metolazone 2.5 x 1 today. ?- Continue TED hose ?- Would like to have central access for CVP monitoring and co-ox +/- pressors.  He refused CVL offered by CCM.  Renal ok'd PICC, he refused this as well.   ?3. CAD with suspectetd NSTEMI: Anterior STEMI in 2016, DES to LAD.  Has been off all meds x 1 month.  He denies chest pain (only dyspnea), but ECG is concerning with STE in AVR and ST depression laterally (inferior Qs were on prior ECG as well). [place] to 138[address].  Possible demand ischemia with DKA and hypotension, but degree of troponin elevation is more suggestive of NSTEMI with ACS.  Echo showed apical hypokinesis (EF 40-45%).  He was not cathed due to lack of chest pain and creatinine up to > 4.  ?- In absence of chest pain, would hold off on cath at this time given markedly elevated creatinine.  ?- Back on heparin gtt in setting of atrial fibrillation. ?- Continue ASA 81 and atorvastatin 80 mg daily.  ?4. AKI on CKD stage IV: Baseline creatinine 2.7-3, currently 4.36 (mildly lower).  Suspect due to DKA/hypotension.   ?- he discussed with his wife whether or not he would accept HD if needed and says he would not want it now. I asked if that would change if it were a matter of life and death and he said maybe ?- No immediate need for HD, creatinine with slight downtrend today.  ?5. Hyperkalemia: In setting of DKA. Resolved.  ?6. Pulmonary: Initial CXR with pulmonary edema.  PCT  7.73, WBCs 12.5.  ?- Covering empirically with ceftriaxone/azithromycin for possible PNA.  ?- Repeat CXR PA/lateral today.  ?7. DKA: Initial presentation.  Off  his home meds (insulin included) for at least 1 month.  ?- Per CCM/internal medicine.  ?8. H/o ETOH abuse: Per wife, drinks fairly heavily still.  ?9. Atrial fibrillation: Remote history of PAF with CVA.  He was not anticoagulated at that time due to ETOH abuse. Had atrial fibrillation this admission, started 4/30 pm and now back in NSR.   ?- Continue amiodarone gtt today, likely to po tomorrow.  ?- Would favor Eliquis use at home, continue heparin gtt for now . ?10. Jehovah's Witness ? ?Mobilize.  ? ? ?Length of Stay: 3 ? ? ?Loralie Champagne MD ?01/19/2022, 8:25 AM ? ?Advanced Heart Failure Team ?Pager [phone] (M-F; 7a - 4p)  ?Please contact [facility] for night-coverage after hours (4p -7a ) and weekends on [email or web address]  ?

## 2022-01-19 NOTE — Progress Notes (Signed)
Patient ID: Timothy Lambert, male   DOB: 1955/12/02, 66 y.o.   MRN: [id number] ?[facility] ?Progress Note  ? ?Assessment/ Plan:   ?1. Acute kidney Injury on CKD4: Baseline creatinine 2.5-2.8. AKI from ATN in the setting of DKA/shock. Renal fucntion appears essentially unchanged overnight (possibly in plateau phase of ATN). Monitor labs with diuretic challenge. No indications for HD.  ?2. DKA: with poorly controlled underlying DM and DKA likely from non-adherence with diet/medications.  ?3. Combined systolic/diastolic CHF: Not clearly decompensated. Will continue diuretic challenges and monitor UOP and renal function to attempt weaning off oxygen.  ?4. Community acquired pneumonia: Afebrile and with improving WBC counts on ceftriaxone and azithromycin.  ?5. Anemia: likely secondary to chronic disease and exacerbated by acute illness/hospitalization. Will monitor trend.  ? ?Subjective:   ?Reports to be feeling a little better today- denies any chest pain and shortness of breath is improving.   ? ?Objective:   ?BP 118/74 (BP Location: Right Arm)   Pulse 70   Temp 98.2 ?F (36.8 ?C) (Oral)   Resp 18   Ht 5\' 7"  (1.702 m)   Wt 79 kg   SpO2 95%   BMI 27.28 kg/m?  ? ?Intake/Output Summary (Last 24 hours) at 01/19/2022 0925 ?Last data filed at 01/19/2022 0749 ?Gross per 24 hour  ?Intake 534.41 ml  ?Output 2600 ml  ?Net -2065.59 ml  ? ?Weight change: -0.5 kg ? ?Physical Exam: ?Gen: Comfortably sitting in recliner, on oxygen via [place], wife at bedside ?CVS: Pulse regular rhythm and normal rate, S1 and S2 normal ?Resp: Fine rales left base with decreased breath sounds over bases ?Abd: Soft, flat, non-tender ?Ext: Trace lower extremity edema. ? ?Imaging: ?No results found. ? ?Labs: ?BMET ?Recent Labs  ?Lab 01/15/22 ?2304 01/16/22 ?0215 01/16/22 ?0258 01/16/22 ?0501 01/16/22 ?1321 01/16/22 ?2344 01/17/22 ?7829 01/18/22 ?5621 01/19/22 ?0034  ?NA 134* 131*  --  129* 136 134* 135 136 133*  ?K 5.4* 5.9*  --  5.6* 4.7  4.8 4.7 4.3 4.1  ?CL 104  --   --  101 105 104 104 105 100  ?CO2 17*  --   --  13* 16* 18* 18* 20* 19*  ?GLUCOSE 422*  --   --  479* 210* 235* 192* 81 133*  ?BUN 61*  --   --  70* 79* 89* 94* 114* 116*  ?CREATININE 3.79*  --  3.93* 4.29* 4.49* 4.45* 4.49* 4.58* 4.36*  ?CALCIUM 8.7*  --   --  8.4* 8.8* 8.7* 8.7* 8.5* 8.5*  ?PHOS  --   --   --   --   --  4.3  --  5.3* 4.7*  ? ?CBC ?Recent Labs  ?Lab 01/15/22 ?2304 01/16/22 ?0215 01/16/22 ?0258 01/16/22 ?2344 01/18/22 ?3086  ?WBC 13.7*  --  10.2 14.8* 12.5*  ?HGB 11.2* 8.8* 10.1* 9.4* 9.4*  ?HCT 33.4* 26.0* 29.8* 26.9* 27.5*  ?MCV 88.4  --  87.6 85.9 86.5  ?PLT 185  --  174 184 199  ? ?Medications:   ? ? aspirin EC  81 mg Oral Daily  ? atorvastatin  80 mg Oral Daily  ? Chlorhexidine Gluconate Cloth  6 each Topical Daily  ? insulin aspart  0-15 Units Subcutaneous Q4H  ? insulin glargine-yfgn  25 Units Subcutaneous QHS  ? metolazone  2.5 mg Oral Once  ? multivitamin with minerals  1 tablet Oral Daily  ? Ensure Max Protein  11 oz Oral BID  ? ?Elmarie Shiley, MD ?01/19/2022,  9:25 AM  ? ? ?

## 2022-01-19 NOTE — Progress Notes (Signed)
?PROGRESS NOTE ? ? ? ?Timothy Lambert  VVO:[id number] DOB: 17-Sep-1956 DOA: 01/15/2022 ?PCP: Ladell Pier, MD  ?Timothy Lambert is a 66 y.o. 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 He was hypoxic with escalating oxygen requirements, ultimately placed on BiPAP, also found to have ST depressions in lateral leads on ECG and troponin 11k > 13k. CXR was consistent with pulmonary edema +/- infiltrate, also noted to have DKA, acute kidney injury, creatinine of 4 on admission along with hypotension on admission. ?-Admitted to the ICU, treated with high-dose IV diuretics by heart failure team and nephrology ?-Transferred out of ICU to [facility] service today 5/1 ? ? ?Subjective: ?-Feels fair overall, mild dyspnea with activity ? ?Assessment and Plan: ? ?Shock ?-Likely secondary to hypovolemia in the setting of DKA, resolved ? ?Acute on chronic systolic CHF ?-Echo this admission noted EF of 40-45% with apical hypokinesis ?-Diuresed with high-dose IV Lasix, 120 Mg twice daily, metolazone ?-Nephrology and heart failure team following ?-Patient refused central access for CVP monitoring ? ?NSTEMI ?-Prior history of DES to LAD in 2016, off all meds, admitted with lateral ST depressions, high-sensitivity troponin peaked at 13838 ?-Echo noted apical hypokinesis, unable to have cardiac cath in the setting of AKI and lack of chest pain, treated with IV heparin  ?-Continue aspirin, atorvastatin ? ?Paroxysmal atrial fibrillation with RVR ?-Last night, previously not anticoagulated in the setting of heavy EtOH abuse ?-Started on amiodarone gtt. last night ?-Remains on IV heparin now, transition to Eliquis soon ? ?AKI on CKD 4 ?-Baseline creatinine in the 2.7-3 range ?-Creatinine 4.3 today, from 4.5 yesterday, nephrology following, diuretics as above ?-Patient has declined hemodialysis this admission however may  consider it in a life or death situation ? ?DKA ?-Off diabetic, all meds for at least a month ?-HbA1c was 14.6 in 12/30/2021 ?-CBGs tightly controlled on current dose of glargine, will decrease dose a bit ? ??  Pneumonia ?-Clinically do not suspect community-acquired pneumonia, initial chest x-ray was more consistent with pulmonary edema, however had elevated procalcitonin of 7.7, treated with IV ceftriaxone, azithromycin in the ICU, will discontinue antibiotics after 5-day course tomorrow ? ?EtOH abuse ?-Counseled, no active withdrawals noted ?-Add daily thiamine ? ? ? ?DVT prophylaxis: IV heparin ?Code Status: DNR ?Family Communication: Discussed patient in detail, no family at bedside ?Disposition Plan: Home pending improvement in volume, kidney function ? ?Consultants:  ?Cardiology, nephrology, palliative ? ?Procedures:  ? ?Antimicrobials:  ? ? ?Objective: ?Vitals:  ? 01/18/22 1944 01/19/22 0003 01/19/22 0420 01/19/22 0700  ?BP: 123/86 114/78 120/80 118/74  ?Pulse: 89 75 70   ?Resp: 20 18 20 18   ?Temp: 98 ?F (36.7 ?C) 98.2 ?F (36.8 ?C) 98.2 ?F (36.8 ?C)   ?TempSrc: Oral Oral Oral   ?SpO2: 92% 94% 95%   ?Weight:   79 kg   ?Height:      ? ? ?Intake/Output Summary (Last 24 hours) at 01/19/2022 1104 ?Last data filed at 01/19/2022 0749 ?Gross per 24 hour  ?Intake 534.41 ml  ?Output 2600 ml  ?Net -2065.59 ml  ? ?Filed Weights  ? 01/17/22 0600 01/18/22 0500 01/19/22 0420  ?Weight: 73.7 kg 79.5 kg 79 kg  ? ? ?Examination: ? ?General exam: Appears calm and comfortable  ?Respiratory system: Clear to auscultation ?Cardiovascular system: S1 & S2 heard, RRR.  ?Abd: nondistended, soft and nontender.Normal bowel sounds heard. ?Central  nervous system: Alert and oriented. No focal neurological deficits. ?Extremities: no edema ?Skin: No rashes ?Psychiatry:  Mood & affect appropriate.  ? ? ? ?Data Reviewed:  ? ?CBC: ?Recent Labs  ?Lab 01/15/22 ?2304 01/16/22 ?0215 01/16/22 ?0258 01/16/22 ?2344 01/18/22 ?1194  ?WBC 13.7*  --  10.2 14.8*  12.5*  ?HGB 11.2* 8.8* 10.1* 9.4* 9.4*  ?HCT 33.4* 26.0* 29.8* 26.9* 27.5*  ?MCV 88.4  --  87.6 85.9 86.5  ?PLT 185  --  174 184 199  ? ?Basic Metabolic Panel: ?Recent Labs  ?Lab 01/16/22 ?0258 01/16/22 ?0501 01/16/22 ?1321 01/16/22 ?2344 01/17/22 ?1740 01/18/22 ?8144 01/19/22 ?0034  ?NA  --    < > 136 134* 135 136 133*  ?K  --    < > 4.7 4.8 4.7 4.3 4.1  ?CL  --    < > 105 104 104 105 100  ?CO2  --    < > 16* 18* 18* 20* 19*  ?GLUCOSE  --    < > 210* 235* 192* 81 133*  ?BUN  --    < > 79* 89* 94* 114* 116*  ?CREATININE 3.93*   < > 4.49* 4.45* 4.49* 4.58* 4.36*  ?CALCIUM  --    < > 8.8* 8.7* 8.7* 8.5* 8.5*  ?MG 1.6*  --   --  1.8 1.8 1.9 1.9  ?PHOS  --   --   --  4.3  --  5.3* 4.7*  ? < > = values in this interval not displayed.  ? ?GFR: ?Estimated Creatinine Clearance: 15.8 mL/min (A) (by C-G formula based on SCr of 4.36 mg/dL (H)). ?Liver Function Tests: ?Recent Labs  ?Lab 01/16/22 ?0501  ?AST 58*  ?ALT 65*  ?ALKPHOS 46  ?BILITOT 1.4*  ?PROT 6.0*  ?ALBUMIN 3.0*  ? ?No results for input(s): LIPASE, AMYLASE in the last 168 hours. ?No results for input(s): AMMONIA in the last 168 hours. ?Coagulation Profile: ?No results for input(s): INR, PROTIME in the last 168 hours. ?Cardiac Enzymes: ?No results for input(s): CKTOTAL, CKMB, CKMBINDEX, TROPONINI in the last 168 hours. ?BNP (last 3 results) ?No results for input(s): PROBNP in the last 8760 hours. ?HbA1C: ?No results for input(s): HGBA1C in the last 72 hours. ?CBG: ?Recent Labs  ?Lab 01/18/22 ?1644 01/18/22 ?2040 01/19/22 ?0030 01/19/22 ?0423 01/19/22 ?0756  ?GLUCAP 138* 170* 140* 70 85  ? ?Lipid Profile: ?Recent Labs  ?  01/16/22 ?2344  ?CHOL 112  ?HDL 39*  ?Oval 61  ?TRIG 62  ?CHOLHDL 2.9  ? ?Thyroid Function Tests: ?No results for input(s): TSH, T4TOTAL, FREET4, T3FREE, THYROIDAB in the last 72 hours. ?Anemia Panel: ?Recent Labs  ?  01/18/22 ?8185  ?FERRITIN 363*  ?TIBC 214*  ?IRON 74  ? ?Urine analysis: ?   ?Component Value Date/Time  ? [place] YELLOW  01/17/2022 0025  ? APPEARANCEUR HAZY (A) 01/17/2022 0025  ? APPEARANCEUR Clear 08/30/2020 0954  ? LABSPEC 1.012 01/17/2022 0025  ? PHURINE 5.0 01/17/2022 0025  ? GLUCOSEU 150 (A) 01/17/2022 0025  ? [place] NEGATIVE 01/17/2022 0025  ? [place] NEGATIVE 01/17/2022 0025  ? BILIRUBINUR Negative 08/30/2020 0954  ? [place] NEGATIVE 01/17/2022 0025  ? PROTEINUR 30 (A) 01/17/2022 0025  ? UROBILINOGEN 0.2 02/09/2015 0418  ? NITRITE NEGATIVE 01/17/2022 0025  ? LEUKOCYTESUR NEGATIVE 01/17/2022 0025  ? ?Sepsis Labs: ?@LABRCNTIP (procalcitonin:4,lacticidven:4) ? ?) ?Recent Results (from the past 240 hour(s))  ?Resp Panel by RT-PCR (Flu A&B, Covid) Nasopharyngeal Swab     Status: None  ? Collection Time: 01/16/22  12:08 AM  ? Specimen: Nasopharyngeal Swab; Nasopharyngeal(NP) swabs in vial transport medium  ?Result Value Ref Range Status  ? SARS Coronavirus 2 by RT PCR NEGATIVE NEGATIVE Final  ?  Comment: (NOTE) ?SARS-CoV-2 target nucleic acids are NOT DETECTED. ? ?The SARS-CoV-2 RNA is generally detectable in upper respiratory ?specimens during the acute phase of infection. The lowest ?concentration of SARS-CoV-2 viral copies this assay can detect is ?138 copies/mL. A negative result does not preclude SARS-Cov-2 ?infection and should not be used as the sole basis for treatment or ?other patient management decisions. A negative result may occur with  ?improper specimen collection/handling, submission of specimen other ?than nasopharyngeal swab, presence of viral mutation(s) within the ?areas targeted by this assay, and inadequate number of viral ?copies(<138 copies/mL). A negative result must be combined with ?clinical observations, patient history, and epidemiological ?information. The expected result is Negative. ? ?Fact Sheet for Patients:  ?EntrepreneurPulse.com.au ? ?Fact Sheet for Healthcare Providers:  ?IncredibleEmployment.be ? ?This test is no t yet approved or cleared by the [place] FDA and  ?has been authorized for detection and/or diagnosis of SARS-CoV-2 by ?FDA under an Emergency Use Authorization (EUA). This EUA will remain  ?in effect (meaning this test can be used) for the dur

## 2022-01-19 NOTE — Progress Notes (Signed)
Physical Therapy Treatment ?Patient Details ?Name: Timothy Lambert ?MRN: [id number] ?DOB: 1956/02/15 ?Today's Date: 01/19/2022 ? ? ?History of Present Illness Pt is a 66 y.o. male who presented 01/15/22 with SOB and positive home COVID test however his COVID and flu PCR here were negative. Pt admitted with acute respiratory failure with hypoxia secondary to acute on chronic systolic CHF, NSTEMI, and AKI on CKD 4. Transferred to ICU 4/28 secondary to hypotension in setting of DKA. PMH: diastolic dysfunction per 2D echo done in 2016, CAD, CVA, paroxysmal atrial fibrillation, DM, HTN, MI, seizures, CHF ? ?  ?PT Comments  ? ? Pt making good progress towards his physical therapy goals, exhibiting improved activity tolerance and decreased oxygen requirement. Pt able to participate in warm up exercise and ambulating 350 ft with no assistive device and up to min assist for balance. SpO2 >/= 90% on 6L O2. Will continue to progress as tolerated. ?   ?Recommendations for follow up therapy are one component of a multi-disciplinary discharge planning process, led by the attending physician.  Recommendations may be updated based on patient status, additional functional criteria and insurance authorization. ? ?Follow Up Recommendations ? Home health PT ?  ?  ?Assistance Recommended at Discharge Intermittent Supervision/Assistance  ?Patient can return home with the following A little help with walking and/or transfers;A little help with bathing/dressing/bathroom;Assistance with cooking/housework;Assist for transportation;Help with stairs or ramp for entrance ?  ?Equipment Recommendations ? None recommended by PT  ?  ?Recommendations for Other Services   ? ? ?  ?Precautions / Restrictions Precautions ?Precautions: Fall;Other (comment) ?Precaution Comments: watch SpO2 ?Restrictions ?Weight Bearing Restrictions: No  ?  ? ?Mobility ? Bed Mobility ?  ?  ?  ?  ?  ?  ?  ?General bed mobility comments: Sitting EOB upon arrival ?   ? ?Transfers ?Overall transfer level: Needs assistance ?Equipment used: None ?Transfers: Sit to/from Stand ?Sit to Stand: Supervision ?  ?  ?  ?  ?  ?  ?  ? ?Ambulation/Gait ?Ambulation/Gait assistance: Min guard, Min assist ?Gait Distance (Feet): 350 Feet ?Assistive device: None ?Gait Pattern/deviations: Step-through pattern, Decreased stride length, Staggering right ?Gait velocity: decreased ?  ?  ?General Gait Details: Pt requiring min guard-minA for stability, tendency for drift and mild dynamic instability ? ? ?Stairs ?  ?  ?  ?  ?  ? ? ?Wheelchair Mobility ?  ? ?Modified Rankin (Stroke Patients Only) ?  ? ? ?  ?Balance Overall balance assessment: Needs assistance ?Sitting-balance support: No upper extremity supported, Feet supported ?Sitting balance-Leahy Scale: Good ?  ?  ?Standing balance support: No upper extremity supported, During functional activity ?Standing balance-Leahy Scale: Fair ?  ?  ?  ?  ?  ?  ?  ?  ?  ?  ?  ?  ?  ? ?  ?Cognition Arousal/Alertness: Awake/alert ?Behavior During Therapy: [facility] for tasks assessed/performed ?Overall Cognitive Status: Within Functional Limits for tasks assessed ?  ?  ?  ?  ?  ?  ?  ?  ?  ?  ?  ?  ?  ?  ?  ?  ?  ?  ?  ? ?  ?Exercises General Exercises - Lower Extremity ?Long Arc Quad: Both, 5 reps, Seated ?Hip Flexion/Marching: Both, 5 reps, Seated ?Toe Raises: Both, 10 reps, Seated ?Heel Raises: Both, 10 reps, Seated ? ?  ?General Comments   ?  ?  ? ?Pertinent Vitals/Pain Pain Assessment ?Pain Assessment: No/denies pain  ? ? ?  Home Living   ?  ?  ?  ?  ?  ?  ?  ?  ?  ?   ?  ?Prior Function    ?  ?  ?   ? ?PT Goals (current goals can now be found in the care plan section) Acute Rehab PT Goals ?Patient Stated Goal: to go home ?Potential to Achieve Goals: Good ?Progress towards PT goals: Progressing toward goals ? ?  ?Frequency ? ? ? Min 3X/week ? ? ? ?  ?PT Plan Current plan remains appropriate  ? ? ?Co-evaluation   ?  ?  ?  ?  ? ?  ?AM-PAC PT "6 Clicks" Mobility    ?Outcome Measure ? Help needed turning from your back to your side while in a flat bed without using bedrails?: None ?Help needed moving from lying on your back to sitting on the side of a flat bed without using bedrails?: None ?Help needed moving to and from a bed to a chair (including a wheelchair)?: A Little ?Help needed standing up from a chair using your arms (e.g., wheelchair or bedside chair)?: A Little ?Help needed to walk in hospital room?: A Little ?Help needed climbing 3-5 steps with a railing? : A Little ?6 Click Score: 20 ? ?  ?End of Session Equipment Utilized During Treatment: Gait belt;Oxygen ?Activity Tolerance: Patient tolerated treatment well ?Patient left: in chair;with call bell/phone within reach ?Nurse Communication: Mobility status ?PT Visit Diagnosis: Unsteadiness on feet (R26.81);Other abnormalities of gait and mobility (R26.89);Muscle weakness (generalized) (M62.81);Difficulty in walking, not elsewhere classified (R26.2) ?  ? ? ?Time: 1700-1749 ?PT Time Calculation (min) (ACUTE ONLY): 24 min ? ?Charges:  $Therapeutic Activity: 23-37 mins          ?          ? ?Wyona Almas, PT, DPT ?Acute Rehabilitation Services ?Pager [phone] ?Office [phone] ? ? ? ?Carloine Margo Aye ?01/19/2022, 9:54 AM ? ?

## 2022-01-19 NOTE — Care Management Important Message (Signed)
Important Message ? ?Patient Details  ?Name: Timothy Lambert ?MRN: [id number] ?Date of Birth: 15-Mar-1956 ? ? ?Medicare Important Message Given:  Yes ? ? ? ? ?[name] ?01/19/2022, 3:50 PM ?

## 2022-01-19 NOTE — TOC Benefit Eligibility Note (Signed)
Patient Advocate Encounter ?  ?Insurance verification completed.   ?  ?The patient is currently admitted and upon discharge could be taking APIXABAN 5 MG. ?  ?The current 30 day co-pay is, $47.  ? ?The patient is insured through [employer]. ? ? ?   ?

## 2022-01-19 NOTE — Progress Notes (Signed)
?  Mobility Specialist Criteria Algorithm Info. ? ? ? 01/19/22 1136  ?Oxygen Therapy  ?SpO2 94 %  ?O2 Device Nasal Cannula  ?Patient Activity (if Appropriate) Ambulating  ?Pulse Oximetry Type Continuous  ?Mobility  ?Activity Ambulated with assistance in hallway (in chair before and after)  ?Range of Motion/Exercises Active;All extremities  ?Level of Assistance Standby assist, set-up cues, supervision of patient - no hands on  ?Assistive Device None ?(IV pole)  ?Distance Ambulated (ft) 500 ft  ? ?Patient received in chair agreeable to participate. Ambulated in hallway supervision level with steady gait. Returned to room without complaint or incident. Was left with all needs met, call bell in reach. ? ?01/19/2022 ?11:49 AM ? ?[place] Kato Wieczorek, CMS, BS EXP ?Acute Rehabilitation Services  ?SAYTK:[phone] ?Office: [phone] ? ?

## 2022-01-19 NOTE — Progress Notes (Signed)
Occupational Therapy Treatment ?Patient Details ?Name: Timothy Lambert ?MRN: [id number] ?DOB: 02-11-1956 ?Today's Date: 01/19/2022 ? ? ?History of present illness Pt is a 66 y.o. male who presented 01/15/22 with SOB and positive home COVID test however his COVID and flu PCR here were negative. Pt admitted with acute respiratory failure with hypoxia secondary to acute on chronic systolic CHF, NSTEMI, and AKI on CKD 4. Transferred to ICU 4/28 secondary to hypotension in setting of DKA. PMH: diastolic dysfunction per 2D echo done in 2016, CAD, CVA, paroxysmal atrial fibrillation, DM, HTN, MI, seizures, CHF ?  ?OT comments ? Patient continues to make steady progress towards goals in skilled OT session. Patient's session encompassed  bathing ADLs in standing at sink. Patient at supervision level for bathing with no need for seated rest break (10 minutes in duration) and min guard to return to bed solely due to managing IV. OT will continue to follow acutely.  ? ?Recommendations for follow up therapy are one component of a multi-disciplinary discharge planning process, led by the attending physician.  Recommendations may be updated based on patient status, additional functional criteria and insurance authorization. ?   ?Follow Up Recommendations ? Home health OT  ?  ?Assistance Recommended at Discharge Intermittent Supervision/Assistance  ?Patient can return home with the following ? A little help with walking and/or transfers;A little help with bathing/dressing/bathroom;Assistance with cooking/housework;Assist for transportation ?  ?Equipment Recommendations ? None recommended by OT  ?  ?Recommendations for Other Services   ? ?  ?Precautions / Restrictions Precautions ?Precautions: Fall;Other (comment) ?Precaution Comments: watch SpO2 ?Restrictions ?Weight Bearing Restrictions: No  ? ? ?  ? ?Mobility Bed Mobility ?  ?  ?  ?  ?  ?  ?  ?General bed mobility comments: Received in standing at sink ?  ? ?Transfers ?Overall  transfer level: Needs assistance ?Equipment used: None ?Transfers: Sit to/from Stand ?Sit to Stand: Min guard ?  ?  ?  ?  ?  ?General transfer comment: Min guard assist due to IV and mild unsteadiness ?  ?  ?Balance Overall balance assessment: Needs assistance ?Sitting-balance support: No upper extremity supported, Feet supported ?Sitting balance-Leahy Scale: Good ?  ?  ?Standing balance support: No upper extremity supported, During functional activity ?Standing balance-Leahy Scale: Fair ?  ?  ?  ?  ?  ?  ?  ?  ?  ?  ?  ?  ?   ? ?ADL either performed or assessed with clinical judgement  ? ?ADL Overall ADL's : Needs assistance/impaired ?  ?  ?Grooming: Supervision/safety;Standing;Wash/dry hands;Wash/dry face ?Grooming Details (indicate cue type and reason): standing x10 mins at sink for grooming. ?Upper Body Bathing: Supervision/ safety;Standing ?  ?Lower Body Bathing: Sit to/from stand;Supervison/ safety ?  ?  ?  ?  ?  ?Toilet Transfer: Min guard ?  ?  ?  ?  ?  ?Functional mobility during ADLs: Min guard ?General ADL Comments: Patient performing grooming at sink at supervision level, min gaurd for ambulation due to IV ?  ? ?Extremity/Trunk Assessment   ?  ?  ?  ?  ?  ? ?Vision   ?  ?  ?Perception   ?  ?Praxis   ?  ? ?Cognition Arousal/Alertness: Awake/alert ?Behavior During Therapy: [facility] for tasks assessed/performed ?Overall Cognitive Status: Within Functional Limits for tasks assessed ?  ?  ?  ?  ?  ?  ?  ?  ?  ?  ?  ?  ?  ?  ?  ?  ?  ?  ?  ?   ?  Exercises   ? ?  ?Shoulder Instructions   ? ? ?  ?General Comments    ? ? ?Pertinent Vitals/ Pain       Pain Assessment ?Pain Assessment: No/denies pain ? ?Home Living   ?  ?  ?  ?  ?  ?  ?  ?  ?  ?  ?  ?  ?  ?  ?  ?  ?  ?  ? ?  ?Prior Functioning/Environment    ?  ?  ?  ?   ? ?Frequency ? Min 2X/week  ? ? ? ? ?  ?Progress Toward Goals ? ?OT Goals(current goals can now be found in the care plan section) ? Progress towards OT goals: Progressing toward goals ? ?Acute Rehab OT  Goals ?Patient Stated Goal: to get stronger ?OT Goal Formulation: With patient ?Time For Goal Achievement: 02/01/22 ?Potential to Achieve Goals: Good  ?Plan Discharge plan remains appropriate   ? ?Co-evaluation ? ? ?   ?  ?  ?  ?  ? ?  ?AM-PAC OT "6 Clicks" Daily Activity     ?Outcome Measure ? ? Help from another person eating meals?: None ?Help from another person taking care of personal grooming?: None ?Help from another person toileting, which includes using toliet, bedpan, or urinal?: A Little ?Help from another person bathing (including washing, rinsing, drying)?: None ?Help from another person to put on and taking off regular upper body clothing?: None ?Help from another person to put on and taking off regular lower body clothing?: A Little ?6 Click Score: 22 ? ?  ?End of Session   ? ?OT Visit Diagnosis: Unsteadiness on feet (R26.81);Muscle weakness (generalized) (M62.81) ?  ?Activity Tolerance Patient tolerated treatment well ?  ?Patient Left in bed;with call bell/phone within reach ?  ?Nurse Communication Mobility status;Other (comment) (Assess IV) ?  ? ?   ? ?Time: 4970-2637 ?OT Time Calculation (min): 12 min ? ?Charges: OT General Charges ?$OT Visit: 1 Visit ?OT Treatments ?$Self Care/Home Management : 8-22 mins ? ?Corinne Ports E. Stephfon Bovey, OTR/L ?Acute Rehabilitation Services ?[phone] ?[phone]  ? ?Corinne Ports Libby Goehring ?01/19/2022, 3:46 PM ?

## 2022-01-20 DIAGNOSIS — I5023 Acute on chronic systolic (congestive) heart failure: Secondary | ICD-10-CM | POA: Diagnosis not present

## 2022-01-20 LAB — CBC
HCT: 30.1 % — ABNORMAL LOW (ref 39.0–52.0)
Hemoglobin: 10.2 g/dL — ABNORMAL LOW (ref 13.0–17.0)
MCH: 28.8 pg (ref 26.0–34.0)
MCHC: 33.9 g/dL (ref 30.0–36.0)
MCV: 85 fL (ref 80.0–100.0)
Platelets: 206 10*3/uL (ref 150–400)
RBC: 3.54 MIL/uL — ABNORMAL LOW (ref 4.22–5.81)
RDW: 13.1 % (ref 11.5–15.5)
WBC: 7.5 10*3/uL (ref 4.0–10.5)
nRBC: 0 % (ref 0.0–0.2)

## 2022-01-20 LAB — BASIC METABOLIC PANEL
Anion gap: 11 (ref 5–15)
BUN: 113 mg/dL — ABNORMAL HIGH (ref 8–23)
CO2: 22 mmol/L (ref 22–32)
Calcium: 8.8 mg/dL — ABNORMAL LOW (ref 8.9–10.3)
Chloride: 99 mmol/L (ref 98–111)
Creatinine, Ser: 4.45 mg/dL — ABNORMAL HIGH (ref 0.61–1.24)
GFR, Estimated: 14 mL/min — ABNORMAL LOW (ref >60)
Glucose, Bld: 151 mg/dL — ABNORMAL HIGH (ref 70–99)
Potassium: 4.2 mmol/L (ref 3.5–5.1)
Sodium: 132 mmol/L — ABNORMAL LOW (ref 135–145)

## 2022-01-20 LAB — GLUCOSE, CAPILLARY
Glucose-Capillary: 116 mg/dL — ABNORMAL HIGH (ref 70–99)
Glucose-Capillary: 123 mg/dL — ABNORMAL HIGH (ref 70–99)
Glucose-Capillary: 129 mg/dL — ABNORMAL HIGH (ref 70–99)
Glucose-Capillary: 167 mg/dL — ABNORMAL HIGH (ref 70–99)
Glucose-Capillary: 188 mg/dL — ABNORMAL HIGH (ref 70–99)
Glucose-Capillary: 241 mg/dL — ABNORMAL HIGH (ref 70–99)
Glucose-Capillary: 290 mg/dL — ABNORMAL HIGH (ref 70–99)
Glucose-Capillary: 321 mg/dL — ABNORMAL HIGH (ref 70–99)
Glucose-Capillary: 60 mg/dL — ABNORMAL LOW (ref 70–99)
Glucose-Capillary: 71 mg/dL (ref 70–99)

## 2022-01-20 MED ORDER — APIXABAN 5 MG PO TABS
5.0000 mg | ORAL_TABLET | Freq: Two times a day (BID) | ORAL | Status: DC
Start: 2022-01-20 — End: 2022-01-22
  Administered 2022-01-20 – 2022-01-22 (×5): 5 mg via ORAL
  Filled 2022-01-20 (×5): qty 1

## 2022-01-20 MED ORDER — INSULIN ASPART 100 UNIT/ML IJ SOLN
0.0000 [IU] | Freq: Three times a day (TID) | INTRAMUSCULAR | Status: DC
  Administered 2022-01-20: 1 [IU] via SUBCUTANEOUS
  Administered 2022-01-20 – 2022-01-22 (×6): 3 [IU] via SUBCUTANEOUS

## 2022-01-20 MED ORDER — INSULIN GLARGINE-YFGN 100 UNIT/ML ~~LOC~~ SOLN
20.0000 [IU] | Freq: Every day | SUBCUTANEOUS | Status: DC
  Administered 2022-01-20 – 2022-01-21 (×2): 20 [IU] via SUBCUTANEOUS
  Filled 2022-01-20 (×3): qty 0.2

## 2022-01-20 NOTE — Progress Notes (Signed)
Pt has PRN BIPAP orders, no distress noted at this time.  °

## 2022-01-20 NOTE — Progress Notes (Signed)
?PROGRESS NOTE ? ? ? ?Timothy Lambert  HQP:[id number] DOB: 02-Aug-1956 DOA: 01/15/2022 ?PCP: Ladell Pier, MD  ?66 y.o. 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 He was hypoxic with escalating oxygen requirements, ultimately placed on BiPAP, also found to have ST depressions in lateral leads on ECG and troponin 11k > 13k. CXR was consistent with pulmonary edema +/- infiltrate, also noted to have DKA, acute kidney injury, creatinine of 4 along with hypotension on admission. ?-Admitted to the ICU, treated with high-dose IV diuretics by heart failure team and nephrology ?-Transferred out of ICU to [facility] service 5/2 ? ?Subjective: ?-Feels okay, no events overnight, breathing is stable ? ?Assessment and Plan: ? ?Acute on chronic systolic CHF ?-Echo this admission noted EF of 40-45% with apical hypokinesis ?-Diuresed with high-dose IV Lasix, 120 Mg twice daily, metolazone yesterday ?-Nephrology and heart failure team following ?-Diuretics on hold today, creatinine remains elevated at 4.45 ?-Patient refused central access for CVP monitoring ?  ?NSTEMI ?-Prior history of DES to LAD in 2016, off all meds, admitted with lateral ST depressions, high-sensitivity troponin [place] at 13838 ?-Echo noted apical hypokinesis, unable to have cardiac cath in the setting of AKI and lack of chest pain, treated with IV heparin  ?-Continue aspirin, atorvastatin ? ?Shock ?-Cardiogenic vs DKA related, resolved ? ?Paroxysmal atrial fibrillation with RVR ?-Last night, previously not anticoagulated in the setting of heavy EtOH abuse ?-Started on amiodarone gtt. last night ?-Changed IV heparin to Eliquis ?  ?AKI on CKD 4 ?-Baseline creatinine in the 2.7-3 range ?-Creatinine remains in the 4.3-4.5 range,  nephrology following, diuresed yesterday, diuretics on hold today ?-Patient has declined hemodialysis this admission  however may consider it in a life or death situation ?  ?DKA ?-Off diabetic, all meds for at least a month ?-HbA1c was 14.6 in 12/30/2021 ?-CBG stable, glargine dose decreased yesterday ?  ??  Pneumonia ?-Clinically do not suspect community-acquired pneumonia, initial chest x-ray was more consistent with pulmonary edema, however had elevated procalcitonin of 7.7, treated with IV ceftriaxone, azithromycin in the ICU, discontinue antibiotics today, 5-day course completed ?  ?EtOH abuse ?-Counseled, no active withdrawals noted ?-Add daily thiamine ?  ?  ?  ?DVT prophylaxis: Eliquis ?Code Status: DNR ?Family Communication: Discussed with patient and wife at bedside ?Disposition Plan: Home pending improvement in volume, kidney function ?  ?Consultants:  ?Cardiology, nephrology, palliative ? ?Procedures:  ? ?Antimicrobials:  ? ? ?Objective: ?Vitals:  ? 01/19/22 2322 01/20/22 0344 01/20/22 0726 01/20/22 1131  ?BP: 107/68 113/67 138/77 (!) 143/77  ?Pulse: 80 85 90 80  ?Resp: 20 17 19 14   ?Temp: 98.3 ?F (36.8 ?C) 98.3 ?F (36.8 ?C) 98.1 ?F (36.7 ?C) 98 ?F (36.7 ?C)  ?TempSrc: Oral Oral Oral Oral  ?SpO2: 96% 94% 92% 96%  ?Weight:  77.9 kg    ?Height:      ? ? ?Intake/Output Summary (Last 24 hours) at 01/20/2022 1209 ?Last data filed at 01/20/2022 1034 ?Gross per 24 hour  ?Intake 1514.84 ml  ?Output 1975 ml  ?Net -460.16 ml  ? ?Filed Weights  ? 01/18/22 0500 01/19/22 0420 01/20/22 0344  ?Weight: 79.5 kg 79 kg 77.9 kg  ? ? ?Examination: ? ?General exam: Chronically ill pleasant male sitting up in bed, AAOx3, no distress ?HEENT: Positive JVD ?CVs: S1-S2, regular rhythm ?Lungs: Decreased at the bases otherwise clear ?Abdomen: Soft,  nontender, bowel sounds present ?Extremities: No edema  ?Skin: No rashes ?Psychiatry:  Mood & affect appropriate.  ? ? ? ?Data Reviewed:  ? ?CBC: ?Recent Labs  ?Lab 01/15/22 ?2304 01/16/22 ?0215 01/16/22 ?0258 01/16/22 ?2344 01/18/22 ?8563 01/20/22 ?0108  ?WBC 13.7*  --  10.2 14.8* 12.5* 7.5  ?HGB 11.2* 8.8*  10.1* 9.4* 9.4* 10.2*  ?HCT 33.4* 26.0* 29.8* 26.9* 27.5* 30.1*  ?MCV 88.4  --  87.6 85.9 86.5 85.0  ?PLT 185  --  174 184 199 206  ? ?Basic Metabolic Panel: ?Recent Labs  ?Lab 01/16/22 ?0258 01/16/22 ?0501 01/16/22 ?2344 01/17/22 ?1497 01/18/22 ?0263 01/19/22 ?7858 01/20/22 ?0108  ?NA  --    < > 134* 135 136 133* 132*  ?K  --    < > 4.8 4.7 4.3 4.1 4.2  ?CL  --    < > 104 104 105 100 99  ?CO2  --    < > 18* 18* 20* 19* 22  ?GLUCOSE  --    < > 235* 192* 81 133* 151*  ?BUN  --    < > 89* 94* 114* 116* 113*  ?CREATININE 3.93*   < > 4.45* 4.49* 4.58* 4.36* 4.45*  ?CALCIUM  --    < > 8.7* 8.7* 8.5*  8.4* 8.5* 8.8*  ?MG 1.6*  --  1.8 1.8 1.9 1.9  --   ?PHOS  --   --  4.3  --  5.3* 4.7*  --   ? < > = values in this interval not displayed.  ? ?GFR: ?Estimated Creatinine Clearance: 15.5 mL/min (A) (by C-G formula based on SCr of 4.45 mg/dL (H)). ?Liver Function Tests: ?Recent Labs  ?Lab 01/16/22 ?0501  ?AST 58*  ?ALT 65*  ?ALKPHOS 46  ?BILITOT 1.4*  ?PROT 6.0*  ?ALBUMIN 3.0*  ? ?No results for input(s): LIPASE, AMYLASE in the last 168 hours. ?No results for input(s): AMMONIA in the last 168 hours. ?Coagulation Profile: ?No results for input(s): INR, PROTIME in the last 168 hours. ?Cardiac Enzymes: ?No results for input(s): CKTOTAL, CKMB, CKMBINDEX, TROPONINI in the last 168 hours. ?BNP (last 3 results) ?No results for input(s): PROBNP in the last 8760 hours. ?HbA1C: ?No results for input(s): HGBA1C in the last 72 hours. ?CBG: ?Recent Labs  ?Lab 01/20/22 ?0433 01/20/22 ?0727 01/20/22 ?0751 01/20/22 ?1019 01/20/22 ?1131  ?GLUCAP 123* 60* 71 116* 129*  ? ?Lipid Profile: ?No results for input(s): CHOL, HDL, LDLCALC, TRIG, CHOLHDL, LDLDIRECT in the last 72 hours. ?Thyroid Function Tests: ?No results for input(s): TSH, T4TOTAL, FREET4, T3FREE, THYROIDAB in the last 72 hours. ?Anemia Panel: ?Recent Labs  ?  01/18/22 ?8502  ?FERRITIN 363*  ?TIBC 214*  ?IRON 74  ? ?Urine analysis: ?   ?Component Value Date/Time  ? [place] YELLOW  01/17/2022 0025  ? APPEARANCEUR HAZY (A) 01/17/2022 0025  ? APPEARANCEUR Clear 08/30/2020 0954  ? LABSPEC 1.012 01/17/2022 0025  ? PHURINE 5.0 01/17/2022 0025  ? GLUCOSEU 150 (A) 01/17/2022 0025  ? [place] NEGATIVE 01/17/2022 0025  ? [place] NEGATIVE 01/17/2022 0025  ? BILIRUBINUR Negative 08/30/2020 0954  ? [place] NEGATIVE 01/17/2022 0025  ? PROTEINUR 30 (A) 01/17/2022 0025  ? UROBILINOGEN 0.2 02/09/2015 0418  ? NITRITE NEGATIVE 01/17/2022 0025  ? LEUKOCYTESUR NEGATIVE 01/17/2022 0025  ? ?Sepsis Labs: ?@[email or web address] (procalcitonin:4,lacticidven:4) ? ?) ?Recent Results (from the past 240 hour(s))  ?Resp Panel by RT-PCR (Flu A&B, Covid) Nasopharyngeal Swab     Status: None  ? Collection Time: 01/16/22 12:08 AM  ?  Specimen: Nasopharyngeal Swab; Nasopharyngeal(NP) swabs in vial transport medium  ?Result Value Ref Range Status  ? SARS Coronavirus 2 by RT PCR NEGATIVE NEGATIVE Final  ?  Comment: (NOTE) ?SARS-CoV-2 target nucleic acids are NOT DETECTED. ? ?The SARS-CoV-2 RNA is generally detectable in upper respiratory ?specimens during the acute phase of infection. The lowest ?concentration of SARS-CoV-2 viral copies this assay can detect is ?138 copies/mL. A negative result does not preclude SARS-Cov-2 ?infection and should not be used as the sole basis for treatment or ?other patient management decisions. A negative result may occur with  ?improper specimen collection/handling, submission of specimen other ?than nasopharyngeal swab, presence of viral mutation(s) within the ?areas targeted by this assay, and inadequate number of viral ?copies(<138 copies/mL). A negative result must be combined with ?clinical observations, patient history, and epidemiological ?information. The expected result is Negative. ? ?Fact Sheet for Patients:  ?EntrepreneurPulse.com.au ? ?Fact Sheet for Healthcare Providers:  ?IncredibleEmployment.be ? ?This test is no t yet approved or cleared by the [place] FDA and  ?has been authorized for detection and/or diagnosis of SARS-CoV-2 by ?FDA under an Emergency Use Authorization (EUA). This EUA will remain  ?in effect (meaning this test can be used) for the

## 2022-01-20 NOTE — Progress Notes (Signed)
Inpatient Diabetes Program Recommendations ? ?AACE/ADA: New Consensus Statement on Inpatient Glycemic Control ? ?Target Ranges:  Prepandial:   less than 140 mg/dL ?     Peak postprandial:   less than 180 mg/dL (1-2 hours) ?     Critically ill patients:  140 - 180 mg/dL  ? ? Latest Reference Range & Units 01/20/22 00:15 01/20/22 04:33 01/20/22 07:27 01/20/22 07:51  ?Glucose-Capillary 70 - 99 mg/dL 167 (H) ? ?Novolog 2 units 123 (H) ? ?Novolog 2 units 60 (L) 71  ? ? Latest Reference Range & Units 01/19/22 04:23 01/19/22 07:56 01/19/22 11:43 01/19/22 16:47 01/19/22 20:19  ?Glucose-Capillary 70 - 99 mg/dL 70 85 97 180 (H) ? ?Novolog 3 units 181 (H) ? ?Novolog 3 units ? ?Semglee 25 units  ? ?Review of Glycemic Control ? ?Diabetes history: DM2 ?Outpatient Diabetes medications:  Lantus 32 units QAM ?Current orders for Inpatient glycemic control: Semglee 25 units daily,  Novolog 0-15 units Q4H ? ?Inpatient Diabetes Program Recommendations:   ? ?Insulin: Please consider changing frequency of CBGs to AC&HS and Novolog 0-15 units to AC&HS. ? ?Thanks, ?Barnie Alderman, RN, MSN, CDE ?Diabetes Coordinator ?Inpatient Diabetes Program ?[phone] (Team Pager from 8am to 5pm) ? ? ? ?

## 2022-01-20 NOTE — TOC CM/SW Note (Signed)
HF TOC CM spoke to wife, [name] and offered choice for [facility]. Wife agreeable to [facility]. Pt may need oxygen for home. Will have Unit RN check walking saturation on day of dc. Will need HHRN/PT/OT orders with F2F. Attending made aware. Contacted [facility] rep, Ramond Marrow with new referral. Waiting confirmation. Jonnie Finner RN3 CCM, Heart Failure TOC CM [phone]  ?

## 2022-01-20 NOTE — Progress Notes (Signed)
Patient ID: Timothy Lambert, male   DOB: [birth year], 66 y.o.   MRN: [id number] ? ? ? ?Advanced Heart Failure Rounding Note ? ? ?Subjective:   ? ?Given lasix 120 iv bid yesterday + metolazone 2.5 -> weight down 3 lbs.  He is now on room air.  ? ?Breathing is better overall, no chest pain.  ? ?Scr 4.49 -> 4.45 -> 4.59 -> 4.36 -> 4.45 ? ?Patient is in and out of atrial fibrillation, mostly NSR.  He remains on amiodarone gtt and heparin gtt.  ? ?Objective:   ?Weight Range: ? ?Vital Signs:   ?Temp:  [97.5 ?F (36.4 ?C)-98.3 ?F (36.8 ?C)] 98.1 ?F (36.7 ?C) (05/02 0726) ?Pulse Rate:  [80-90] 90 (05/02 0726) ?Resp:  [17-23] 19 (05/02 0726) ?BP: (107-140)/(67-78) 138/77 (05/02 0726) ?SpO2:  [90 %-96 %] 92 % (05/02 0726) ?Weight:  [77.9 kg] 77.9 kg (05/02 0344) ?Last BM Date : 01/19/22 ? ?Weight change: ?Filed Weights  ? 01/18/22 0500 01/19/22 0420 01/20/22 0344  ?Weight: 79.5 kg 79 kg 77.9 kg  ? ? ?Intake/Output:  ? ?Intake/Output Summary (Last 24 hours) at 01/20/2022 0830 ?Last data filed at 01/20/2022 0174 ?Gross per 24 hour  ?Intake 1109.7 ml  ?Output 1450 ml  ?Net -340.3 ml  ?  ? ?Physical Exam: ?General: NAD ?Neck: No JVD, no thyromegaly or thyroid nodule.  ?Lungs: Coarse BS.  ?CV: Nondisplaced PMI.  Heart regular S1/S2, no S3/S4, no murmur.  No peripheral edema.   ?Abdomen: Soft, nontender, no hepatosplenomegaly, no distention.  ?Skin: Intact without lesions or rashes.  ?Neurologic: Alert and oriented x 3.  ?Psych: Normal affect. ?Extremities: No clubbing or cyanosis.  ?HEENT: Normal.  ? ?Telemetry: Primarily NSR but occasional AF runs. Personally reviewed ? ?Labs: ?Basic Metabolic Panel: ?Recent Labs  ?Lab 01/16/22 ?0258 01/16/22 ?0501 01/16/22 ?2344 01/17/22 ?9449 01/18/22 ?6759 01/19/22 ?1638 01/20/22 ?0108  ?NA  --    < > 134* 135 136 133* 132*  ?K  --    < > 4.8 4.7 4.3 4.1 4.2  ?CL  --    < > 104 104 105 100 99  ?CO2  --    < > 18* 18* 20* 19* 22  ?GLUCOSE  --    < > 235* 192* 81 133* 151*  ?BUN  --    < > 89* 94* 114*  116* 113*  ?CREATININE 3.93*   < > 4.45* 4.49* 4.58* 4.36* 4.45*  ?CALCIUM  --    < > 8.7* 8.7* 8.5*  8.4* 8.5* 8.8*  ?MG 1.6*  --  1.8 1.8 1.9 1.9  --   ?PHOS  --   --  4.3  --  5.3* 4.7*  --   ? < > = values in this interval not displayed.  ? ? ?Liver Function Tests: ?Recent Labs  ?Lab 01/16/22 ?0501  ?AST 58*  ?ALT 65*  ?ALKPHOS 46  ?BILITOT 1.4*  ?PROT 6.0*  ?ALBUMIN 3.0*  ? ?No results for input(s): LIPASE, AMYLASE in the last 168 hours. ?No results for input(s): AMMONIA in the last 168 hours. ? ?CBC: ?Recent Labs  ?Lab 01/15/22 ?2304 01/16/22 ?0215 01/16/22 ?0258 01/16/22 ?2344 01/18/22 ?4665 01/20/22 ?0108  ?WBC 13.7*  --  10.2 14.8* 12.5* 7.5  ?HGB 11.2* 8.8* 10.1* 9.4* 9.4* 10.2*  ?HCT 33.4* 26.0* 29.8* 26.9* 27.5* 30.1*  ?MCV 88.4  --  87.6 85.9 86.5 85.0  ?PLT 185  --  174 184 199 206  ? ? ?Cardiac Enzymes: ?No results for  input(s): CKTOTAL, CKMB, CKMBINDEX, TROPONINI in the last 168 hours. ? ?BNP: ?BNP (last 3 results) ?Recent Labs  ?  01/15/22 ?2304  ?BNP 3,498.1*  ? ? ?ProBNP (last 3 results) ?No results for input(s): PROBNP in the last 8760 hours. ? ? ? ?Other results: ? ?Imaging: ?DG Chest 2 View ? ?Result Date: 01/19/2022 ?CLINICAL DATA:  Pneumonia.  No chest complaints at this time. EXAM: CHEST - 2 VIEW COMPARISON:  AP chest 01/15/2022, chest two views 02/19/2015, AP chest 01/28/2015 FINDINGS: Cardiac silhouette is again mildly to moderately enlarged. Mild calcification within the aortic arch. Mildly decreased lung volumes. Improved aeration of the upper lungs. Small bilateral pleural effusions with bibasilar atelectasis. No pneumothorax. Mild-to-moderate multilevel degenerative disc changes of the thoracic spine. IMPRESSION: Mildly decreased lung volumes with small bilateral pleural effusions and bibasilar subsegmental atelectasis. Interval improvement in the prior bilateral interstitial thickening/interstitial pulmonary edema. Electronically Signed   By: Yvonne Kendall M.D.   On: 01/19/2022 16:18    ? ? ?Medications:   ? ? ?Scheduled Medications: ? aspirin EC  81 mg Oral Daily  ? atorvastatin  80 mg Oral Daily  ? insulin aspart  0-15 Units Subcutaneous Q4H  ? insulin glargine-yfgn  25 Units Subcutaneous QHS  ? multivitamin with minerals  1 tablet Oral Daily  ? Ensure Max Protein  11 oz Oral BID  ? ? ?Infusions: ? amiodarone 30 mg/hr (01/20/22 0344)  ? ? ?PRN Medications: ?acetaminophen **OR** acetaminophen, albuterol ? ? ?Assessment/Plan:  ? ?1. Shock: In setting of DKA.  Refused central access.  Echo with EF 40-45% range. Now resolved.  ?2. 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  Echo in 8/16 with EF 50-55%.  Echo this admission showed EF 40-45% with apical HK, mild LVH, normal RV, dilated IVC. Picture complicated by AKI.  Has been getting Lasix 120 mg bid + metolazone, weight down.  On exam, he does not look volume overloaded at this point and he is back on room air.  Creatinine fairly stable today at 4.45.   ?- Hold diuretics today.  ?- No GDMT at this time given renal failure.  ?- Would have liked to have had central access for CVP monitoring and co-ox.  He refused CVL offered by CCM.  Renal ok'd PICC, he refused this as well.   ?3. CAD with suspectetd NSTEMI: Anterior STEMI in 2016, DES to LAD.  Has been off all meds x 1 month.  He denies chest pain (only dyspnea), but ECG is concerning with STE in AVR and ST depression laterally (inferior Qs were on prior ECG as well). [place] 138[address].  Possible demand ischemia with DKA and hypotension, but degree of troponin elevation is more suggestive of NSTEMI with ACS.  Echo showed apical hypokinesis (EF 40-45%).  He was not cathed due to lack of chest pain and creatinine up to > 4.  ?- In absence of chest pain, would hold off on cath at this time given markedly elevated creatinine.  ?- Transitioning from heparin gtt to apixaban.  ?- Continue ASA 81 and atorvastatin 80 mg daily.  ?4. AKI on CKD  stage IV: Baseline creatinine 2.7-3, currently 4.45, appears to have reached a plateau.  Suspect due to DKA/hypotension.  Baseline diabetic nephropathy.  ?- he discussed with his wife whether or not he would accept HD if needed and says he would not want it now. I asked if that would change if  it were a matter of life and death and he said maybe ?- No immediate need for HD, hopefully creatinine will start to trend down.  ?5. Hyperkalemia: In setting of DKA. Resolved.  ?6. Pulmonary: Initial CXR with pulmonary edema.  CXR yesterday improved.  ?- Covering empirically with ceftriaxone/azithromycin for possible PNA, complete 5 days.  ?7. DM: Presented with DKA. Off his home meds (insulin included) for at least 1 month.  ?- Per CCM/internal medicine.  ?8. H/o ETOH abuse: Per wife, drinks fairly heavily still.  ?9. Atrial fibrillation: Remote history of PAF with CVA.  He was not anticoagulated at that time due to ETOH abuse. Had atrial fibrillation this admission.  Overnight, primarily NSR but has had AF runs.   ?- Continue amiodarone gtt today with ongoing AF runs, likely to po tomorrow.  ?- Can transition to Eliquis today with stable creatinine.  ?10. Jehovah's Witness ? ?Mobilize.  ? ? ?Length of Stay: 4 ? ? ?Loralie Champagne MD ?01/20/2022, 8:30 AM ? ?Advanced Heart Failure Team ?Pager [phone] (M-F; 7a - 4p)  ?Please contact [facility] for night-coverage after hours (4p -7a ) and weekends on [email or web address]  ?

## 2022-01-20 NOTE — Plan of Care (Signed)
?  Problem: Education: ?Goal: Ability to demonstrate management of disease process will improve ?Outcome: Progressing ?  ?Problem: Cardiac: ?Goal: Ability to achieve and maintain adequate cardiopulmonary perfusion will improve ?Outcome: Progressing ?  ?

## 2022-01-20 NOTE — Progress Notes (Signed)
Patient ID: Timothy Lambert, male   DOB: Oct 09, 1955, 66 y.o.   MRN: [id number] ?[facility] ?Progress Note  ? ?Assessment/ Plan:   ?1. Acute kidney Injury on CKD4: Baseline creatinine 2.5-2.8. AKI from ATN in the setting of DKA/shock. Renal fucntion appears essentially unchanged overnight (possibly in plateau phase of ATN).  Decent urine output noted overnight in response to furosemide.  No indications for dialysis noted at this time.  ?2. DKA: with poorly controlled underlying DM and DKA likely from non-adherence with diet/medications.  ?3. Combined systolic/diastolic CHF: Clinically appears to be compensated at this time, remains on scheduled diuretics. ?4. Community acquired pneumonia: Afebrile and with improving WBC counts on ceftriaxone and azithromycin.  ?5. Anemia: likely secondary to chronic disease and exacerbated by acute illness/hospitalization. Will monitor trend.  ? ?Subjective:   ?He states that he had a good night and feels better this morning.  Denies chest pain or shortness of breath.   ? ?Objective:   ?BP 138/77 (BP Location: Right Arm)   Pulse 90   Temp 98.1 ?F (36.7 ?C) (Oral)   Resp 19   Ht 5\' 7"  (1.702 m)   Wt 77.9 kg   SpO2 92%   BMI 26.90 kg/m?  ? ?Intake/Output Summary (Last 24 hours) at 01/20/2022 0744 ?Last data filed at 01/20/2022 0263 ?Gross per 24 hour  ?Intake 1109.7 ml  ?Output 2450 ml  ?Net -1340.3 ml  ? ?Weight change: -1.1 kg ? ?Physical Exam: ?Gen: Comfortably sitting up in bed eating breakfast, not on supplemental oxygen.  Wife at bedside ?CVS: Pulse regular rhythm and normal rate, S1 and S2 normal ?Resp: Fine rales localized to the left base otherwise clear to auscultation ?Abd: Soft, flat, non-tender ?Ext: Trace lower extremity edema. ? ?Imaging: ?DG Chest 2 View ? ?Result Date: 01/19/2022 ?CLINICAL DATA:  Pneumonia.  No chest complaints at this time. EXAM: CHEST - 2 VIEW COMPARISON:  AP chest 01/15/2022, chest two views 02/19/2015, AP chest 01/28/2015 FINDINGS:  Cardiac silhouette is again mildly to moderately enlarged. Mild calcification within the aortic arch. Mildly decreased lung volumes. Improved aeration of the upper lungs. Small bilateral pleural effusions with bibasilar atelectasis. No pneumothorax. Mild-to-moderate multilevel degenerative disc changes of the thoracic spine. IMPRESSION: Mildly decreased lung volumes with small bilateral pleural effusions and bibasilar subsegmental atelectasis. Interval improvement in the prior bilateral interstitial thickening/interstitial pulmonary edema. Electronically Signed   By: Yvonne Kendall M.D.   On: 01/19/2022 16:18   ? ?Labs: ?BMET ?Recent Labs  ?Lab 01/16/22 ?0501 01/16/22 ?1321 01/16/22 ?2344 01/17/22 ?7858 01/18/22 ?8502 01/19/22 ?7741 01/20/22 ?0108  ?NA 129* 136 134* 135 136 133* 132*  ?K 5.6* 4.7 4.8 4.7 4.3 4.1 4.2  ?CL 101 105 104 104 105 100 99  ?CO2 13* 16* 18* 18* 20* 19* 22  ?GLUCOSE 479* 210* 235* 192* 81 133* 151*  ?BUN 70* 79* 89* 94* 114* 116* 113*  ?CREATININE 4.29* 4.49* 4.45* 4.49* 4.58* 4.36* 4.45*  ?CALCIUM 8.4* 8.8* 8.7* 8.7* 8.5*  8.4* 8.5* 8.8*  ?PHOS  --   --  4.3  --  5.3* 4.7*  --   ? ?CBC ?Recent Labs  ?Lab 01/16/22 ?0258 01/16/22 ?2344 01/18/22 ?2878 01/20/22 ?0108  ?WBC 10.2 14.8* 12.5* 7.5  ?HGB 10.1* 9.4* 9.4* 10.2*  ?HCT 29.8* 26.9* 27.5* 30.1*  ?MCV 87.6 85.9 86.5 85.0  ?PLT 174 184 199 206  ? ?Medications:   ? ? aspirin EC  81 mg Oral Daily  ? atorvastatin  80 mg  Oral Daily  ? insulin aspart  0-15 Units Subcutaneous Q4H  ? insulin glargine-yfgn  25 Units Subcutaneous QHS  ? multivitamin with minerals  1 tablet Oral Daily  ? Ensure Max Protein  11 oz Oral BID  ? ?Elmarie Shiley, MD ?01/20/2022, 7:44 AM  ? ? ?

## 2022-01-21 ENCOUNTER — Other Ambulatory Visit (HOSPITAL_COMMUNITY): Payer: Self-pay

## 2022-01-21 DIAGNOSIS — E1122 Type 2 diabetes mellitus with diabetic chronic kidney disease: Secondary | ICD-10-CM

## 2022-01-21 DIAGNOSIS — N189 Chronic kidney disease, unspecified: Secondary | ICD-10-CM

## 2022-01-21 DIAGNOSIS — F1010 Alcohol abuse, uncomplicated: Secondary | ICD-10-CM | POA: Diagnosis present

## 2022-01-21 DIAGNOSIS — I10 Essential (primary) hypertension: Secondary | ICD-10-CM

## 2022-01-21 DIAGNOSIS — Z794 Long term (current) use of insulin: Secondary | ICD-10-CM

## 2022-01-21 DIAGNOSIS — J181 Lobar pneumonia, unspecified organism: Secondary | ICD-10-CM | POA: Diagnosis present

## 2022-01-21 LAB — GLUCOSE, CAPILLARY
Glucose-Capillary: 208 mg/dL — ABNORMAL HIGH (ref 70–99)
Glucose-Capillary: 233 mg/dL — ABNORMAL HIGH (ref 70–99)
Glucose-Capillary: 222 mg/dL — ABNORMAL HIGH (ref 70–99)
Glucose-Capillary: 192 mg/dL — ABNORMAL HIGH (ref 70–99)

## 2022-01-21 LAB — BASIC METABOLIC PANEL
Anion gap: 12 (ref 5–15)
BUN: 107 mg/dL — ABNORMAL HIGH (ref 8–23)
CO2: 23 mmol/L (ref 22–32)
Calcium: 8.9 mg/dL (ref 8.9–10.3)
Chloride: 99 mmol/L (ref 98–111)
Creatinine, Ser: 4.39 mg/dL — ABNORMAL HIGH (ref 0.61–1.24)
GFR, Estimated: 14 mL/min — ABNORMAL LOW (ref >60)
Glucose, Bld: 288 mg/dL — ABNORMAL HIGH (ref 70–99)
Potassium: 4.3 mmol/L (ref 3.5–5.1)
Sodium: 134 mmol/L — ABNORMAL LOW (ref 135–145)

## 2022-01-21 MED ORDER — AMIODARONE HCL 200 MG PO TABS
400.0000 mg | ORAL_TABLET | Freq: Two times a day (BID) | ORAL | Status: DC
  Administered 2022-01-21 – 2022-01-22 (×3): 400 mg via ORAL
  Filled 2022-01-21 (×3): qty 2

## 2022-01-21 MED ORDER — ISOSORB DINITRATE-HYDRALAZINE 20-37.5 MG PO TABS
0.5000 | ORAL_TABLET | Freq: Three times a day (TID) | ORAL | Status: DC
  Administered 2022-01-21 – 2022-01-22 (×4): 0.5 via ORAL
  Filled 2022-01-21 (×4): qty 1

## 2022-01-21 MED ORDER — CARVEDILOL 3.125 MG PO TABS
3.1250 mg | ORAL_TABLET | Freq: Two times a day (BID) | ORAL | Status: DC
Start: 2022-01-21 — End: 2022-01-21

## 2022-01-21 MED ORDER — CARVEDILOL 3.125 MG PO TABS
3.1250 mg | ORAL_TABLET | Freq: Two times a day (BID) | ORAL | Status: DC
  Administered 2022-01-21 – 2022-01-22 (×3): 3.125 mg via ORAL
  Filled 2022-01-21 (×3): qty 1

## 2022-01-21 NOTE — Assessment & Plan Note (Addendum)
Sp recovered DKA on admission. ?Patient is tolerating po well fasting glucose is 288 consistent with uncontrolled hyperglycemia.  ? ?Plan to continue insulin therapy. ?Close follow up as outpatient. ? ?His fasting glucose at his discharge is 273  ?Considering low GFR will continue with reduced dose of basal insulin 10 units for now.  ?Dyslipidemia, continue with statin therapy.  ?

## 2022-01-21 NOTE — Assessment & Plan Note (Signed)
No clinical sings of withdrawal, continue with neuro checks.  ?

## 2022-01-21 NOTE — Progress Notes (Signed)
?                                                                                                                                                     ?                                                   ?Palliative Medicine Progress Note  ? ?Patient Name: Timothy Lambert       Date: 01/21/2022 ?DOB: September 15, 1956  Age: 66 y.o. MRN#: [id number] ?Attending Physician: Tawni Millers,* ?Primary Care Physician: Ladell Pier, MD ?Admit Date: 01/15/2022 ? ?Reason for Consultation/Follow-up: goals of care ? ?HPI/Patient Profile: ?66 y.o. male  with past medical history of chronic systolic CHF, CAD, prior STEMI in 2016 s/p DES to LAD, ICM, paroxysmal atrial fibrillation, CKD stage III, and diabetes mellitus type 2 who presented to the emergency department on 01/15/2022 with shortness of breath.  In the ED, patient was hypoxic and required BiPAP.  Chest x-ray showed congestion consistent with CHF with some concern for pneumonia.  Glucose 422, creatinine 3.79 and BNP elevated at 3400. Troponin > 11,000.  ?He was initially admitted to [facility] with acute respiratory failure with hypoxia secondary to acute CHF, DKA, and AKI on CKD.  ?PCCM and advanced heart failure consulted due to concern for cardiogenic shock.  ? ?Subjective: ?Chart reviewed.  Note that creatinine is 4.39 today. On 5/1, he ambulated 350 ft in the hallway with no assistive device.  ? ?I went to see patient at bedside.  He has no acute complaints. He is on room air.  Amiodarone infusion is off; discussed plan to transition to oral amiodarone. Discussed that creatinine appears to have stabilized and there is no immediate need for dialysis. Per HF team, if creatinine remains stable he may be ablt to discharge tomorrow. Patient verbalizes being ready to go home. ? ? ?Objective: ? ?Physical Exam ?Vitals reviewed.  ?Constitutional:   ?   General: He is not in acute distress. ?   Comments: Chronically ill-appearing  ?Cardiovascular:  ?   Rate and Rhythm: Normal rate  and regular rhythm.  ?Pulmonary:  ?   Effort: Pulmonary effort is normal.  ?Neurological:  ?   Mental Status: He is alert and oriented to person, place, and time.  ?         ? ?Vital Signs: BP 130/74 (BP Location: Right Arm)   Pulse 79   Temp 98.1 ?F (36.7 ?C) (Oral)   Resp 17   Ht 5\' 7"  (1.702 m)   Wt 75.8 kg   SpO2 97%   BMI 26.17 kg/m?  ?SpO2: SpO2: 97 % ?O2 Device: O2 Device: Room Air ? ? ?  LBM: Last BM Date : 01/20/22 ? ?   ?Palliative Assessment/Data: PPS 60% ? ? ? ? ?Palliative Medicine Assessment & Plan  ? ?Assessment: ?Principal Problem: ?  Acute on chronic systolic CHF (congestive heart failure) ([place]) ?Active Problems: ?  Non-ST elevation (NSTEMI) myocardial infarction [facility]) ?  Essential hypertension ?  CAD (coronary artery disease) ?  AKI (acute kidney injury) ([place]) ?  Paroxysmal atrial fibrillation (HCC) ?  CKD (chronic kidney disease) stage 3, GFR 30-59 ml/min (HCC) ?  Diabetes ([place]) ?  Acute respiratory failure with hypoxia ([place]) ?  Acute respiratory failure with hypoxemia (HCC) ?  ? ?Recommendations/Plan: ?DNR/DNI as previously documented ?Continue all current interventions ?Patient hopeful for continued improvement ?Goal of care is to return home ? ? ?Prognosis: ? Unable to determine ? ?Discharge Planning: ?Home with Home Health and PT ? ? ? ?Thank you for allowing the Palliative Medicine Team to assist in the care of this patient. ? ? ?MDM - moderate ? ? ?Lavena Bullion, NP ? ? ?Please contact Palliative Medicine Team phone at [phone] for questions and concerns.  ?For individual providers, please see AMION. ? ? ? ? ? ?

## 2022-01-21 NOTE — Progress Notes (Signed)
?  Progress Note ? ? ?Patient: Timothy Lambert QQP:[id number] DOB: Feb 01, 1956 DOA: 01/15/2022     5 ?DOS: the patient was seen and examined on 01/21/2022 ?  ?Brief hospital course: ?66 y.o. 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 He was hypoxic with escalating oxygen requirements, ultimately placed on BiPAP, also found to have ST depressions in lateral leads on ECG and troponin 11k > 13k. CXR was consistent with pulmonary edema +/- infiltrate, also noted to have DKA, acute kidney injury, creatinine of 4 along with hypotension on admission. ?-Admitted to the ICU, treated with high-dose IV diuretics by heart failure team and nephrology ?-Transferred out of ICU to [facility] service 5/2 ? ?Assessment and Plan: ?* Acute on chronic systolic CHF (congestive heart failure) ([place]) ?Sp recovered cardiogenic shock.  ? ?Echocardiogram with mild reduction in systolic function with EF 40 to 45%. ? ?Volume status has improved. ?Urine output put over last 24 hrs is 2.050 ml. ?Systolic blood pressure 712 to 130 mmHg  ? ?Plan to continue medical therapy with carvedilol, and hydralazine isosorbide combination.  ?Holding diuresis for now.  ? ?Non-ST elevation (NSTEMI) myocardial infarction [facility]) ?No current chest pain,  ?High sensitive troponin peaked at 13838.  ?Positive wall motion abnormalities with apical hypokinesis.  ?Not candidate for invasive contrast procedures due to reduced GFR. ? ?Continue medical therapy with aspirin and statin therapy.  ? ?Paroxysmal atrial fibrillation ([place]) ?Rate control with amiodarone, now transitioned to oral formulation. ?Continue anticoagulation with apixaban.  ? ?Acute kidney injury superimposed on chronic kidney disease ([place]) ?CKD stage 4. Hyponatremia  ?Renal function with serum cr at 4.39 with K at 4,3 and serum bicarbonate at 23. ?Improved volume status. ? ?Continue to hold on  diuretic therapy. ?Follow up renal function in am, avoid hypotension and nephrotoxic medications  ? ?Essential hypertension ?Continue blood pressure control with carvedilol and bidil.  ? ?Diabetes ([place]) ?Sp recovered DKA on admission. ?Patient is tolerating po well fasting glucose is 288 consistent with uncontrolled hyperglycemia.  ? ?Plan to continue insulin therapy, sliding scale and basal.  ? ?Dyslipidemia, continue with statin therapy.  ? ?Lobar pneumonia ([place]) ?Patient has been treated with 5 days of antibiotic therapy for community acquired pneumonia, present on admission.  ? ?Alcohol abuse ?No clinical sings of withdrawal, continue with neuro checks.  ? ? ? ? ?  ? ?Subjective: Patient is out of bed to chair, no nausea or vomiting, no chest pain, dyspnea has improved  ? ?Physical Exam: ?Vitals:  ? 01/21/22 0955 01/21/22 1141 01/21/22 1712 01/21/22 1945  ?BP:  130/74 118/67 117/61  ?Pulse: 79 79 78 76  ?Resp:  17  13  ?Temp:    98.1 ?F (36.7 ?C)  ?TempSrc:    Oral  ?SpO2:  97%  98%  ?Weight:      ?Height:      ? ?Neurology awake and alert ?ENT with no pallor ?Cardiovascular with S1 and S2 present and rhythmic with no gallops, rubs and murmurs ?No JVD ?No lower extremity edema ?Respiratory with no rales or wheezing ?Abdomen not distended  ?Data Reviewed: ? ? ? ?Family Communication: no family at the bedside  ? ?Disposition: ?Status is: Inpatient ?Remains inpatient appropriate because: heart failure management  ? Planned Discharge Destination: Home ? ? ? ? ?Syrianna Schillaci Gerome Apley, MD ?01/21/2022 8:34 PM ? ?For on call review [email or web address].  ?

## 2022-01-21 NOTE — Progress Notes (Signed)
Patient ID: Timothy Lambert, male   DOB: [birth year], 66 y.o.   MRN: [id number] ? ? ? ?Advanced Heart Failure Rounding Note ? ? ?Subjective:   ? ?No diuretic give yesterday, good UOP.  Denies dyspnea, remains on room air.  No chest pain.  ? ?Scr 4.49 -> 4.45 -> 4.59 -> 4.36 -> 4.45 -> 4.39 ? ?Patient remains in NSR on amiodarone gtt.   ? ?Objective:   ?Weight Range: ? ?Vital Signs:   ?Temp:  [98 ?F (36.7 ?C)-98.8 ?F (37.1 ?C)] 98.1 ?F (36.7 ?C) (05/03 0408) ?Pulse Rate:  [77-90] 84 (05/03 0408) ?Resp:  [14-19] 14 (05/03 0408) ?BP: (135-150)/(71-82) 147/75 (05/03 0408) ?SpO2:  [93 %-96 %] 95 % (05/03 0408) ?Weight:  [75.8 kg] 75.8 kg (05/03 0417) ?Last BM Date : 01/20/22 ? ?Weight change: ?Filed Weights  ? 01/19/22 0420 01/20/22 0344 01/21/22 0417  ?Weight: 79 kg 77.9 kg 75.8 kg  ? ? ?Intake/Output:  ? ?Intake/Output Summary (Last 24 hours) at 01/21/2022 0930 ?Last data filed at 01/21/2022 0300 ?Gross per 24 hour  ?Intake 278.09 ml  ?Output 1550 ml  ?Net -1271.91 ml  ?  ? ?Physical Exam: ?General: NAD ?Neck: No JVD, no thyromegaly or thyroid nodule.  ?Lungs: Clear to auscultation bilaterally with normal respiratory effort. ?CV: Nondisplaced PMI.  Heart regular S1/S2, no S3/S4, no murmur.  No peripheral edema.    ?Abdomen: Soft, nontender, no hepatosplenomegaly, no distention.  ?Skin: Intact without lesions or rashes.  ?Neurologic: Alert and oriented x 3.  ?Psych: Normal affect. ?Extremities: No clubbing or cyanosis.  ?HEENT: Normal.  ? ?Telemetry: NSR. Personally reviewed ? ?Labs: ?Basic Metabolic Panel: ?Recent Labs  ?Lab 01/16/22 ?0258 01/16/22 ?0501 01/16/22 ?2344 01/17/22 ?8341 01/18/22 ?9622 01/19/22 ?2979 01/20/22 ?8921 01/21/22 ?0119  ?NA  --    < > 134* 135 136 133* 132* 134*  ?K  --    < > 4.8 4.7 4.3 4.1 4.2 4.3  ?CL  --    < > 104 104 105 100 99 99  ?CO2  --    < > 18* 18* 20* 19* 22 23  ?GLUCOSE  --    < > 235* 192* 81 133* 151* 288*  ?BUN  --    < > 89* 94* 114* 116* 113* 107*  ?CREATININE 3.93*   < > 4.45*  4.49* 4.58* 4.36* 4.45* 4.39*  ?CALCIUM  --    < > 8.7* 8.7* 8.5*  8.4* 8.5* 8.8* 8.9  ?MG 1.6*  --  1.8 1.8 1.9 1.9  --   --   ?PHOS  --   --  4.3  --  5.3* 4.7*  --   --   ? < > = values in this interval not displayed.  ? ? ?Liver Function Tests: ?Recent Labs  ?Lab 01/16/22 ?0501  ?AST 58*  ?ALT 65*  ?ALKPHOS 46  ?BILITOT 1.4*  ?PROT 6.0*  ?ALBUMIN 3.0*  ? ?No results for input(s): LIPASE, AMYLASE in the last 168 hours. ?No results for input(s): AMMONIA in the last 168 hours. ? ?CBC: ?Recent Labs  ?Lab 01/15/22 ?2304 01/16/22 ?0215 01/16/22 ?0258 01/16/22 ?2344 01/18/22 ?1941 01/20/22 ?0108  ?WBC 13.7*  --  10.2 14.8* 12.5* 7.5  ?HGB 11.2* 8.8* 10.1* 9.4* 9.4* 10.2*  ?HCT 33.4* 26.0* 29.8* 26.9* 27.5* 30.1*  ?MCV 88.4  --  87.6 85.9 86.5 85.0  ?PLT 185  --  174 184 199 206  ? ? ?Cardiac Enzymes: ?No results for input(s): CKTOTAL, CKMB, CKMBINDEX, TROPONINI  in the last 168 hours. ? ?BNP: ?BNP (last 3 results) ?Recent Labs  ?  01/15/22 ?2304  ?BNP 3,498.1*  ? ? ?ProBNP (last 3 results) ?No results for input(s): PROBNP in the last 8760 hours. ? ? ? ?Other results: ? ?Imaging: ?DG Chest 2 View ? ?Result Date: 01/19/2022 ?CLINICAL DATA:  Pneumonia.  No chest complaints at this time. EXAM: CHEST - 2 VIEW COMPARISON:  AP chest 01/15/2022, chest two views 02/19/2015, AP chest 01/28/2015 FINDINGS: Cardiac silhouette is again mildly to moderately enlarged. Mild calcification within the aortic arch. Mildly decreased lung volumes. Improved aeration of the upper lungs. Small bilateral pleural effusions with bibasilar atelectasis. No pneumothorax. Mild-to-moderate multilevel degenerative disc changes of the thoracic spine. IMPRESSION: Mildly decreased lung volumes with small bilateral pleural effusions and bibasilar subsegmental atelectasis. Interval improvement in the prior bilateral interstitial thickening/interstitial pulmonary edema. Electronically Signed   By: Yvonne Kendall M.D.   On: 01/19/2022 16:18   ? ? ?Medications:    ? ? ?Scheduled Medications: ? amiodarone  400 mg Oral BID  ? apixaban  5 mg Oral BID  ? aspirin EC  81 mg Oral Daily  ? atorvastatin  80 mg Oral Daily  ? insulin aspart  0-9 Units Subcutaneous TID WC  ? insulin glargine-yfgn  20 Units Subcutaneous QHS  ? isosorbide-hydrALAZINE  0.5 tablet Oral TID  ? multivitamin with minerals  1 tablet Oral Daily  ? Ensure Max Protein  11 oz Oral BID  ? ? ?Infusions: ? ? ? ?PRN Medications: ?acetaminophen **OR** acetaminophen, albuterol ? ? ?Assessment/Plan:  ? ?1. Shock: In setting of DKA.  Refused central access.  Echo with EF 40-45% range. Now resolved.  ?2. 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  Echo in 8/16 with EF 50-55%.  Echo this admission showed EF 40-45% with apical HK, mild LVH, normal RV, dilated IVC. Picture complicated by AKI.  On exam, he does not look volume overloaded at this point and he is back on room air.  Creatinine fairly stable today at 4.45  => 4.39.   ?- Hold diuretics again today.  ?- Start Bidil 0.5 tab tid and Coreg 3.125 mg bid.   ?- Would have liked to have had central access for CVP monitoring and co-ox.  He refused CVL offered by CCM.  Renal ok'd PICC, he refused this as well.   ?3. CAD with suspectetd NSTEMI: Anterior STEMI in 2016, DES to LAD.  Has been off all meds x 1 month.  He denies chest pain (only dyspnea), but ECG is concerning with STE in AVR and ST depression laterally (inferior Qs were on prior ECG as well). [place]nI up to 13838.  Possible demand ischemia with DKA and hypotension, but degree of troponin elevation is more suggestive of NSTEMI with ACS.  Echo showed apical hypokinesis (EF 40-45%).  He was not cathed due to lack of chest pain and creatinine up to > 4.  ?- In absence of chest pain, would hold off on cath at this time given markedly elevated creatinine.   ?- Continue ASA 81 and atorvastatin 80 mg daily.  ?4. AKI on CKD stage IV: Baseline creatinine 2.7-3,  currently 4.45 => 4.39, appears to have reached a plateau.  Suspect due to DKA/hypotension. Baseline diabetic nephropathy.  ?- he discussed with his wife whether or not he would accept HD if needed and says he would not want it now. I asked if that  would change if it were a matter of life and death and he said maybe ?- No immediate need for HD, hopefully creatinine will start to trend down over time.  ?5. Hyperkalemia: In setting of DKA. Resolved.  ?6. Pulmonary: Initial CXR with pulmonary edema.  Completed abx for PNA. Now on room air.  ?7. DM: Presented with DKA. Off his home meds (insulin included) for at least 1 month.  ?- Per CCM/internal medicine.  ?8. H/o ETOH abuse: Per wife, drinks fairly heavily still.  ?9. Atrial fibrillation: Remote history of PAF with CVA.  He was not anticoagulated at that time due to ETOH abuse. Had atrial fibrillation this admission.  He is now back in NSR.  ?- Transition to po amiodarone.  ?- Continue Eliquis.   ?10. [employer] ? ?Mobilize. If creatinine remains stable/trends down, can probably go home tomorrow.  ? ?Length of Stay: 5 ? ? ?Loralie Champagne MD ?01/21/2022, 9:30 AM ? ?Advanced Heart Failure Team ?Pager [phone] (M-F; 7a - 4p)  ?Please contact [facility] for night-coverage after hours (4p -7a ) and weekends on [email or web address]  ?

## 2022-01-21 NOTE — Assessment & Plan Note (Addendum)
Sp recovered shock, cardiogenic, hypovolemic. ? ?Patient has achieved euvolemia, his fluid balanced at the time of his discharge is negative -5,521 ml. ?Blood pressure is 117 to 122 mmHg range.  ? ?Echocardiogram with mild reduction in systolic function with EF 40 to 45%. Akinetic inferior wall and basal inferolateral segment. Preserved RV systolic function. RVSP 40,0 mmHg. No significant valvular disease.  ? ?Patient will continue heart failure management with carvedilol, and hydralazine isosorbide combination (bidil).  ?Diuresis with furosemide as needed for signs of volume overload.  ?

## 2022-01-21 NOTE — Assessment & Plan Note (Signed)
Continue blood pressure control with carvedilol and bidil.  

## 2022-01-21 NOTE — Assessment & Plan Note (Signed)
Patient has been treated with 5 days of antibiotic therapy for community acquired pneumonia, present on admission.  ?

## 2022-01-21 NOTE — Progress Notes (Signed)
Occupational Therapy Treatment ?Patient Details ?Name: Timothy Lambert ?MRN: [id number] ?DOB: 1955/11/14 ?Today's Date: 01/21/2022 ? ? ?History of present illness Pt is a 66 y.o. male who presented 01/15/22 with SOB and positive home COVID test however his COVID and flu PCR here were negative. Pt admitted with acute respiratory failure with hypoxia secondary to acute on chronic systolic CHF, NSTEMI, and AKI on CKD 4. Transferred to ICU 4/28 secondary to hypotension in setting of DKA. PMH: diastolic dysfunction per 2D echo done in 2016, CAD, CVA, paroxysmal atrial fibrillation, DM, HTN, MI, seizures, CHF ?  ?OT comments ? Patient seated on EOB upon entry. Patient ambulated to recliner from EOB with IV pole for support. Energy conservation strategies handout provided to patient and reviewed with patient. Patient asked to recall strategies and was able to provided 3/5. Patient to continue to be followed by acute OT.   ? ?Recommendations for follow up therapy are one component of a multi-disciplinary discharge planning process, led by the attending physician.  Recommendations may be updated based on patient status, additional functional criteria and insurance authorization. ?   ?Follow Up Recommendations ? Home health OT  ?  ?Assistance Recommended at Discharge Intermittent Supervision/Assistance  ?Patient can return home with the following ? A little help with walking and/or transfers;A little help with bathing/dressing/bathroom;Assistance with cooking/housework;Assist for transportation ?  ?Equipment Recommendations ? None recommended by OT  ?  ?Recommendations for Other Services   ? ?  ?Precautions / Restrictions Precautions ?Precautions: Fall;Other (comment) ?Precaution Comments: watch SpO2 ?Restrictions ?Weight Bearing Restrictions: No  ? ? ?  ? ?Mobility Bed Mobility ?Overal bed mobility: Modified Independent ?  ?  ?  ?  ?  ?  ?General bed mobility comments: seated on EOB upon arrival ?  ? ?Transfers ?Overall transfer  level: Needs assistance ?Equipment used: None ?Transfers: Sit to/from Stand ?Sit to Stand: Min guard ?  ?  ?  ?  ?  ?General transfer comment: used IV pole for support ?  ?  ?Balance Overall balance assessment: Needs assistance ?Sitting-balance support: No upper extremity supported, Feet supported ?Sitting balance-Leahy Scale: Good ?Sitting balance - Comments: seated on EOB upon arrival ?  ?Standing balance support: No upper extremity supported, During functional activity ?Standing balance-Leahy Scale: Fair ?  ?  ?  ?  ?  ?  ?  ?  ?  ?  ?  ?  ?   ? ?ADL either performed or assessed with clinical judgement  ? ?ADL   ?  ?  ?  ?  ?  ?  ?  ?  ?  ?  ?  ?  ?  ?  ?  ?  ?  ?  ?  ?  ?  ? ?Extremity/Trunk Assessment   ?  ?  ?  ?  ?  ? ?Vision   ?  ?  ?Perception   ?  ?Praxis   ?  ? ?Cognition Arousal/Alertness: Awake/alert ?Behavior During Therapy: [facility] for tasks assessed/performed ?Overall Cognitive Status: Within Functional Limits for tasks assessed ?  ?  ?  ?  ?  ?  ?  ?  ?  ?  ?  ?  ?  ?  ?  ?  ?  ?  ?  ?   ?Exercises   ? ?  ?Shoulder Instructions   ? ? ?  ?General Comments provided hand out on Energy conservation stategies and discussed  ? ? ?Pertinent Vitals/ Pain  Pain Assessment ?Pain Assessment: No/denies pain ?Faces Pain Scale: No hurt ?Pain Intervention(s): Monitored during session ? ?Home Living   ?  ?  ?  ?  ?  ?  ?  ?  ?  ?  ?  ?  ?  ?  ?  ?  ?  ?  ? ?  ?Prior Functioning/Environment    ?  ?  ?  ?   ? ?Frequency ? Min 2X/week  ? ? ? ? ?  ?Progress Toward Goals ? ?OT Goals(current goals can now be found in the care plan section) ? Progress towards OT goals: Progressing toward goals ? ?Acute Rehab OT Goals ?Patient Stated Goal: get better ?OT Goal Formulation: With patient ?Time For Goal Achievement: 02/01/22 ?Potential to Achieve Goals: Good ?ADL Goals ?Additional ADL Goal #1: Pt will perform x15 mins of OOB ADL with O2 sats >92%. ?Additional ADL Goal #2: pt will state 3 energy conservation techniques in  order to increase independence with ADL and mobility.  ?Plan Discharge plan remains appropriate   ? ?Co-evaluation ? ? ?   ?  ?  ?  ?  ? ?  ?AM-PAC OT "6 Clicks" Daily Activity     ?Outcome Measure ? ? Help from another person eating meals?: None ?Help from another person taking care of personal grooming?: None ?Help from another person toileting, which includes using toliet, bedpan, or urinal?: A Little ?Help from another person bathing (including washing, rinsing, drying)?: None ?Help from another person to put on and taking off regular upper body clothing?: None ?Help from another person to put on and taking off regular lower body clothing?: A Little ?6 Click Score: 22 ? ?  ?End of Session   ? ?OT Visit Diagnosis: Unsteadiness on feet (R26.81);Muscle weakness (generalized) (M62.81) ?  ?Activity Tolerance Patient tolerated treatment well ?  ?Patient Left in chair;with call bell/phone within reach ?  ?Nurse Communication Mobility status ?  ? ?   ? ?Time: 4536-4680 ?OT Time Calculation (min): 29 min ? ?Charges: OT General Charges ?$OT Visit: 1 Visit ?OT Treatments ?$Therapeutic Activity: 23-37 mins ? ?Lodema Hong, OTA ?Acute Rehabilitation Services  ?Pager [phone] ?Office [phone] ? ? ?[place] ?01/21/2022, 1:18 PM ?

## 2022-01-21 NOTE — Assessment & Plan Note (Addendum)
High sensitive troponin peaked at 13838.  ?Positive wall motion abnormalities with inferior wall and basal inferolateral segment akinetic on the left ventricle. ? ?Not candidate for invasive contrast procedures due to reduced GFR. Patient was medically treated with IV heparin and B blockade, along with aspirin and statin.  ? ?Increased statin to atorvastatin 80 mg, continue with aspirin for one more month and then discontinue in the setting of full anticoagulation with apixaban.  ?

## 2022-01-21 NOTE — Progress Notes (Signed)
Inpatient Diabetes Program Recommendations ? ?AACE/ADA: New Consensus Statement on Inpatient Glycemic Control  ? ?Target Ranges:  Prepandial:   less than 140 mg/dL ?     Peak postprandial:   less than 180 mg/dL (1-2 hours) ?     Critically ill patients:  140 - 180 mg/dL  ? ? Latest Reference Range & Units 01/20/22 15:15 01/20/22 16:56 01/20/22 19:43 01/20/22 23:41 01/21/22 06:15  ?Glucose-Capillary 70 - 99 mg/dL 188 (H) 241 (H) 321 (H) 290 (H) 208 (H)  ? ? Latest Reference Range & Units 01/20/22 07:27 01/20/22 07:51 01/20/22 10:19 01/20/22 11:31  ?Glucose-Capillary 70 - 99 mg/dL 60 (L) 71 116 (H) 129 (H)  ? ?Review of Glycemic Control ? ?Diabetes history: DM2 ?Outpatient Diabetes medications:  Lantus 32 units QAM ?Current orders for Inpatient glycemic control: Semglee 20 units daily,  Novolog 0-9 units TID ? ?Inpatient Diabetes Program Recommendations:   ?  ?Insulin: Please consider increasing Semglee to 23 units daily,adding Novolog 0-5 units QHS for bedtime correction, and ordering Novolog 3 units TID with meals for meal coverage if patient eats at least 50% of meals. ?  ?Thanks, ?Barnie Alderman, RN, MSN, CDE ?Diabetes Coordinator ?Inpatient Diabetes Program ?[phone] (Team Pager from 8am to 5pm) ? ?

## 2022-01-21 NOTE — Discharge Instructions (Signed)

## 2022-01-21 NOTE — TOC Benefit Eligibility Note (Signed)
Patient Advocate Encounter ? ?Insurance verification completed.   ? ?The patient is currently admitted and upon discharge could be taking isosorbide-hydralazine 20-37.5 mg tablet. ? ?The current 30 day co-pay is, $47.00.  ? ?The patient is insured through [employer] Part D  ? ? ? ?Lyndel Safe, CPhT ?Pharmacy Patient Advocate Specialist ?[place] Patient Advocate Team ?Direct Number: [phone]  Fax: [phone] ? ? ? ? ? ?  ?

## 2022-01-21 NOTE — Progress Notes (Signed)
Physical Therapy Treatment ?Patient Details ?Name: Timothy Lambert ?MRN: [id number] ?DOB: July 09, 1956 ?Today's Date: 01/21/2022 ? ? ?History of Present Illness Pt is a 66 y.o. male who presented 01/15/22 with SOB and positive home COVID test however his COVID and flu PCR here were negative. Pt admitted with acute respiratory failure with hypoxia secondary to acute on chronic systolic CHF, NSTEMI, and AKI on CKD 4. Transferred to ICU 4/28 secondary to hypotension in setting of DKA. PMH: diastolic dysfunction per 2D echo done in 2016, CAD, CVA, paroxysmal atrial fibrillation, DM, HTN, MI, seizures, CHF ? ?  ?PT Comments  ? ? Pt continues to display deficits in static and dynamic standing balance that place him at risk for falls. Pt is able to ambulate fairly steadily while ambulating with his head straight anteriorly, but when changing head positions he tends to lose balance, needing up to minA to recover. Pt also unable to maintain static narrow stances without excessive ankle reactional strategies or LOB. He was able to maintain semi-tandem stance >7 sec each leg in lead before LOB, but was unable to maintain tandem stance without LOB. Pt educated on his risk for falls and to use his RW at d/c for improved safety. He verbalized understanding. Will continue to follow acutely. Current recommendations remain appropriate. ?   ?Recommendations for follow up therapy are one component of a multi-disciplinary discharge planning process, led by the attending physician.  Recommendations may be updated based on patient status, additional functional criteria and insurance authorization. ? ?Follow Up Recommendations ? Home health PT ?  ?  ?Assistance Recommended at Discharge Intermittent Supervision/Assistance  ?Patient can return home with the following A little help with walking and/or transfers;A little help with bathing/dressing/bathroom;Assistance with cooking/housework;Assist for transportation;Help with stairs or ramp for  entrance ?  ?Equipment Recommendations ? None recommended by PT  ?  ?Recommendations for Other Services   ? ? ?  ?Precautions / Restrictions Precautions ?Precautions: Fall;Other (comment) ?Precaution Comments: watch SpO2 ?Restrictions ?Weight Bearing Restrictions: No  ?  ? ?Mobility ? Bed Mobility ?Overal bed mobility: Modified Independent ?  ?  ?  ?  ?  ?  ?General bed mobility comments: Able to perform all bed mobility aspects without assistance ?  ? ?Transfers ?Overall transfer level: Needs assistance ?Equipment used: None ?Transfers: Sit to/from Stand ?Sit to Stand: Min guard ?  ?  ?  ?  ?  ?General transfer comment: Min guard for safety, mild instability, but no LOB ?  ? ?Ambulation/Gait ?Ambulation/Gait assistance: Min guard, Min assist ?Gait Distance (Feet): 100 Feet ?Assistive device: None ?Gait Pattern/deviations: Step-through pattern, Decreased stride length, Staggering right ?Gait velocity: decreased ?Gait velocity interpretation: <1.8 ft/sec, indicate of risk for recurrent falls ?  ?General Gait Details: Pt with mild trunk sway when ambulating straight path, but when changing head positions he tends to have LOB/staggering bout needing up to minA to recover with reactional step strategy noted. Educated pt to still use RW when without therapy due to his risk for falls. He verbalized understanding. ? ? ?Stairs ?  ?  ?  ?  ?  ? ? ?Wheelchair Mobility ?  ? ?Modified Rankin (Stroke Patients Only) ?  ? ? ?  ?Balance Overall balance assessment: Needs assistance ?Sitting-balance support: No upper extremity supported, Feet supported ?Sitting balance-Leahy Scale: Good ?  ?  ?Standing balance support: No upper extremity supported, During functional activity ?Standing balance-Leahy Scale: Fair ?Standing balance comment: Pt with intermittent LOB when challenging vestibular system while  ambulating, needing up to minA to recover. Pt with instability and excessive ankle reactional strategies in narrow static stance,  unable to tolerate tandem but able to perform Rhomberg and semi-tandem with min guard ?  ?  ?  ?  ?Rhomberg - Eyes Opened: 10 (seconds) ?  ?High level balance activites: Head turns ?High Level Balance Comments: Semi-tandem static stance no UE support, eyes open, min guard, ~7-13 sec each leg ?  ?  ?  ?  ? ?  ?Cognition Arousal/Alertness: Awake/alert ?Behavior During Therapy: [facility] for tasks assessed/performed ?Overall Cognitive Status: Within Functional Limits for tasks assessed ?  ?  ?  ?  ?  ?  ?  ?  ?  ?  ?  ?  ?  ?  ?  ?  ?  ?  ?  ? ?  ?Exercises General Exercises - Lower Extremity ?Mini-Sqauts: AROM, Strengthening, Both, 10 reps (standing no UE support) ?Other Exercises ?Other Exercises: Semi-tandem static stance no UE support, eyes open, min guard, ~7-13 sec each leg ?Other Exercises: Rhomberg stance ~10 sec eyes open, no UE support, min guard ?Other Exercises: unable to hold tandem stance eyes open, no UE support ? ?  ?General Comments General comments (skin integrity, edema, etc.): educated pt on his risk for falls ?  ?  ? ?Pertinent Vitals/Pain Pain Assessment ?Pain Assessment: No/denies pain  ? ? ?Home Living   ?  ?  ?  ?  ?  ?  ?  ?  ?  ?   ?  ?Prior Function    ?  ?  ?   ? ?PT Goals (current goals can now be found in the care plan section) Acute Rehab PT Goals ?Patient Stated Goal: to go home ?PT Goal Formulation: With patient ?Time For Goal Achievement: 01/31/22 ?Potential to Achieve Goals: Good ?Progress towards PT goals: Progressing toward goals ? ?  ?Frequency ? ? ? Min 3X/week ? ? ? ?  ?PT Plan Current plan remains appropriate  ? ? ?Co-evaluation   ?  ?  ?  ?  ? ?  ?AM-PAC PT "6 Clicks" Mobility   ?Outcome Measure ? Help needed turning from your back to your side while in a flat bed without using bedrails?: None ?Help needed moving from lying on your back to sitting on the side of a flat bed without using bedrails?: None ?Help needed moving to and from a bed to a chair (including a wheelchair)?: A  Little ?Help needed standing up from a chair using your arms (e.g., wheelchair or bedside chair)?: A Little ?Help needed to walk in hospital room?: A Little ?Help needed climbing 3-5 steps with a railing? : A Little ?6 Click Score: 20 ? ?  ?End of Session   ?Activity Tolerance: Patient tolerated treatment well ?Patient left: in bed;with call bell/phone within reach;with bed alarm set ?Nurse Communication: Mobility status ?PT Visit Diagnosis: Unsteadiness on feet (R26.81);Other abnormalities of gait and mobility (R26.89);Muscle weakness (generalized) (M62.81);Difficulty in walking, not elsewhere classified (R26.2) ?  ? ? ?Time: 5465-6812 ?PT Time Calculation (min) (ACUTE ONLY): 10 min ? ?Charges:  $Neuromuscular Re-education: 8-22 mins          ?          ? ?Moishe Spice, PT, DPT ?Acute Rehabilitation Services  ?Pager: [phone] ?Office: [phone] ? ? ? ?Timothy Lambert ?01/21/2022, 5:56 PM ? ?

## 2022-01-21 NOTE — Hospital Course (Addendum)
Timothy Lambert was admitted to the hospital with the working diagnosis of decompensated heart failure. Complicated with respiratory failure, NSTEMI, pneumonia and DKA.  ? ?66 y.o. 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 reported one week of worsening dyspnea and cough, along with lower extremity edema. In the ED he was hypoxic with escalating oxygen requirements, ultimately placed on BiPAP. Lungs with no wheezing, heart with S1 and S2 present with no gallops, rubs or murmurs, abdomen with no distention, and positive bilateral lower extremity edema.  ? ?Na 134, K 5,4 Cl 104, bicarbonate 17, glucose 422, bun 61 cr 3,79 anion gap 13  ?AST 58 ALT 65 ?BNP 3,498 ?High sensitive troponin 11,409 and 13,838 ?Wbc 13,7 hgb 11,2 plt 185 ?Sars covid 19 negative  ? ?Urine analysis SG 1,012, 30 protein, 0-5 wbc and 0-5 rbc  ? ?Chest radiograph with cardiomegaly, bilateral hilar vascular congestin, small right pleural effusion.  ? ?EKG 99 bpm, normal axis, normal intervals, sinus rhythm with poor R wave progression, q wave lead III and Avf, ST depression in lead I and AvL, with T wave inversions lead I, AVL, V5 to V6.  ? ?Patient developed shock and was admitted to the ICU.  ?Treated with high-dose IV diuretics by heart failure team and nephrology ?Anticoagulation with heparin for NSTEMI. ?Insulin drip for DKA  ? ?Echocardiogram with reduced LV systolic function.  ? ?Patient developed atrial fibrillation with RVR, placed on amiodarone with good toleration.  ?Transferred out of ICU to [facility] service 5/2 ? ?Plan to discharge home and have close follow up as outpatient with cardiology, nephrology and primary care. ?Resume taking furosemide a needed for volume overload.  ?

## 2022-01-21 NOTE — Assessment & Plan Note (Addendum)
Patient developed acute atrial fibrillation with RVR.  ?He was placed on  Amiodarone IV with good toleration. ?At home will continue with amiodarone as instructed. ?Anticoagulation with apixaban.  ? ?At the time of his discharge patient is on sinus rhythm.  ?

## 2022-01-21 NOTE — Progress Notes (Signed)
Patient ID: Timothy Lambert, male   DOB: 1955-10-11, 66 y.o.   MRN: [id number] ?[facility] ?Progress Note  ? ?Assessment/ Plan:   ?1. Acute kidney Injury on CKD4: Baseline creatinine 2.5-2.8. AKI from ATN in the setting of DKA/shock. Renal function appears to be slightly improved overnight (appears to have been in the plateau phase of ATN).  Off furosemide at this time with fair UOP.  No indications for dialysis noted at this time.  ?2. DKA: with poorly controlled underlying DM and DKA likely from non-adherence with diet/medications.  ?3. Combined systolic/diastolic CHF: Clinically appears to be compensated at this time, holding diuretics. ?4. Community acquired pneumonia: Afebrile and with improved WBC count s/p ceftriaxone and azithromycin.  ?5. Anemia: likely secondary to chronic disease and exacerbated by acute illness/hospitalization. Will monitor trend.  ? ?Subjective:   ?He states that he had a good night and feels better this morning.  Denies chest pain or shortness of breath.   ? ?Objective:   ?BP (!) 147/75 (BP Location: Right Arm)   Pulse 84   Temp 98.1 ?F (36.7 ?C) (Oral)   Resp 14   Ht 5\' 7"  (1.702 m)   Wt 75.8 kg   SpO2 95%   BMI 26.17 kg/m?  ? ?Intake/Output Summary (Last 24 hours) at 01/21/2022 0808 ?Last data filed at 01/21/2022 0300 ?Gross per 24 hour  ?Intake 398.09 ml  ?Output 1550 ml  ?Net -1151.91 ml  ? ?Weight change: -2.1 kg ? ?Physical Exam: ?Gen: Sitting up on the side of his bed eating breakfast.  Wife at bedside ?CVS: Pulse regular rhythm and normal rate, S1 and S2 normal ?Resp: Right lung clear to auscultation, fine rales over left base without rhonchi ?Abd: Soft, flat, non-tender ?Ext: Trace lower extremity edema. ? ?Imaging: ?DG Chest 2 View ? ?Result Date: 01/19/2022 ?CLINICAL DATA:  Pneumonia.  No chest complaints at this time. EXAM: CHEST - 2 VIEW COMPARISON:  AP chest 01/15/2022, chest two views 02/19/2015, AP chest 01/28/2015 FINDINGS: Cardiac silhouette is again  mildly to moderately enlarged. Mild calcification within the aortic arch. Mildly decreased lung volumes. Improved aeration of the upper lungs. Small bilateral pleural effusions with bibasilar atelectasis. No pneumothorax. Mild-to-moderate multilevel degenerative disc changes of the thoracic spine. IMPRESSION: Mildly decreased lung volumes with small bilateral pleural effusions and bibasilar subsegmental atelectasis. Interval improvement in the prior bilateral interstitial thickening/interstitial pulmonary edema. Electronically Signed   By: Yvonne Kendall M.D.   On: 01/19/2022 16:18   ? ?Labs: ?BMET ?Recent Labs  ?Lab 01/16/22 ?1321 01/16/22 ?2344 01/17/22 ?3976 01/18/22 ?7341 01/19/22 ?9379 01/20/22 ?0240 01/21/22 ?0119  ?NA 136 134* 135 136 133* 132* 134*  ?K 4.7 4.8 4.7 4.3 4.1 4.2 4.3  ?CL 105 104 104 105 100 99 99  ?CO2 16* 18* 18* 20* 19* 22 23  ?GLUCOSE 210* 235* 192* 81 133* 151* 288*  ?BUN 79* 89* 94* 114* 116* 113* 107*  ?CREATININE 4.49* 4.45* 4.49* 4.58* 4.36* 4.45* 4.39*  ?CALCIUM 8.8* 8.7* 8.7* 8.5*  8.4* 8.5* 8.8* 8.9  ?PHOS  --  4.3  --  5.3* 4.7*  --   --   ? ?CBC ?Recent Labs  ?Lab 01/16/22 ?0258 01/16/22 ?2344 01/18/22 ?9735 01/20/22 ?0108  ?WBC 10.2 14.8* 12.5* 7.5  ?HGB 10.1* 9.4* 9.4* 10.2*  ?HCT 29.8* 26.9* 27.5* 30.1*  ?MCV 87.6 85.9 86.5 85.0  ?PLT 174 184 199 206  ? ?Medications:   ? ? apixaban  5 mg Oral BID  ? aspirin  EC  81 mg Oral Daily  ? atorvastatin  80 mg Oral Daily  ? insulin aspart  0-9 Units Subcutaneous TID WC  ? insulin glargine-yfgn  20 Units Subcutaneous QHS  ? multivitamin with minerals  1 tablet Oral Daily  ? Ensure Max Protein  11 oz Oral BID  ? ?Elmarie Shiley, MD ?01/21/2022, 8:08 AM  ? ? ?

## 2022-01-21 NOTE — Assessment & Plan Note (Addendum)
CKD stage 4. Hyponatremia  ?Positive acute tubular necrosis, recovering.  ? ?Patient had close monitoring of his renal function, at the time of his discharge his serum cr is 4,4 with K at 4,6 and serum bicarbonate at 24. Na ia 134.  ? ?Plan to resume diuresis as needed with furosemide 20 mg.  ?Follow up with nephrology as outpatient, renal function and electrolytes.  ?

## 2022-01-21 NOTE — Progress Notes (Signed)
?  Mobility Specialist Criteria Algorithm Info. ? ? ? 01/21/22 1500  ?Mobility  ?Activity Ambulated with assistance in hallway (in chair before and after ambulation)  ?Range of Motion/Exercises Active;All extremities  ?Level of Assistance Standby assist, set-up cues, supervision of patient - no hands on  ?Assistive Device Other (Comment) ?(IV Pole)  ?Distance Ambulated (ft) 500 ft  ?Activity Response Tolerated well  ? ?Patient received in chair agreeable to participate. Ambulated in hallway supervision level with steady gait. Tolerated well without complaint or incident. Was left in chair with all needs met, call bell in reach.  ? ?01/21/2022 ?4:06 PM ? ?Martin[place] Aizley Stenseth, CMS, BS EXP ?Acute Rehabilitation Services  ?SHUOH:[phone] ?Office: [phone] ? ?

## 2022-01-22 ENCOUNTER — Other Ambulatory Visit (HOSPITAL_COMMUNITY): Payer: Self-pay

## 2022-01-22 DIAGNOSIS — F1010 Alcohol abuse, uncomplicated: Secondary | ICD-10-CM

## 2022-01-22 DIAGNOSIS — J181 Lobar pneumonia, unspecified organism: Secondary | ICD-10-CM

## 2022-01-22 DIAGNOSIS — E785 Hyperlipidemia, unspecified: Secondary | ICD-10-CM

## 2022-01-22 DIAGNOSIS — D649 Anemia, unspecified: Secondary | ICD-10-CM

## 2022-01-22 DIAGNOSIS — E1169 Type 2 diabetes mellitus with other specified complication: Secondary | ICD-10-CM

## 2022-01-22 LAB — GLUCOSE, CAPILLARY
Glucose-Capillary: 232 mg/dL — ABNORMAL HIGH (ref 70–99)
Glucose-Capillary: 206 mg/dL — ABNORMAL HIGH (ref 70–99)

## 2022-01-22 LAB — BASIC METABOLIC PANEL
Anion gap: 10 (ref 5–15)
BUN: 95 mg/dL — ABNORMAL HIGH (ref 8–23)
CO2: 24 mmol/L (ref 22–32)
Calcium: 8.6 mg/dL — ABNORMAL LOW (ref 8.9–10.3)
Chloride: 100 mmol/L (ref 98–111)
Creatinine, Ser: 4.44 mg/dL — ABNORMAL HIGH (ref 0.61–1.24)
GFR, Estimated: 14 mL/min — ABNORMAL LOW (ref >60)
Glucose, Bld: 273 mg/dL — ABNORMAL HIGH (ref 70–99)
Potassium: 4.6 mmol/L (ref 3.5–5.1)
Sodium: 134 mmol/L — ABNORMAL LOW (ref 135–145)

## 2022-01-22 MED ORDER — ADULT MULTIVITAMIN W/MINERALS CH: 1.0000 | ORAL_TABLET | Freq: Every day | ORAL | 0 refills | Status: AC

## 2022-01-22 MED ORDER — ACETAMINOPHEN 325 MG PO TABS: 650.0000 mg | ORAL_TABLET | Freq: Four times a day (QID) | ORAL | Status: DC | PRN

## 2022-01-22 MED ORDER — FUROSEMIDE 20 MG PO TABS: 20.0000 mg | ORAL_TABLET | Freq: Every day | ORAL | 1 refills | Status: DC | PRN

## 2022-01-22 MED ORDER — APIXABAN 5 MG PO TABS
5.0000 mg | ORAL_TABLET | Freq: Two times a day (BID) | ORAL | 0 refills | Status: DC
  Filled 2022-01-22: qty 60, 30d supply, fill #0

## 2022-01-22 MED ORDER — ASPIRIN EC 81 MG PO TBEC: 81.0000 mg | DELAYED_RELEASE_TABLET | Freq: Every day | ORAL | 0 refills | Status: DC

## 2022-01-22 MED ORDER — ENSURE MAX PROTEIN PO LIQD
11.0000 [oz_av] | Freq: Two times a day (BID) | ORAL | 0 refills | Status: AC
  Filled 2022-01-22: qty 19800, 30d supply, fill #0

## 2022-01-22 MED ORDER — AMIODARONE HCL 200 MG PO TABS
ORAL_TABLET | ORAL | 0 refills | Status: DC
  Filled 2022-01-22: qty 90, 60d supply, fill #0

## 2022-01-22 MED ORDER — LANTUS SOLOSTAR 100 UNIT/ML ~~LOC~~ SOPN
20.0000 [IU] | PEN_INJECTOR | Freq: Every day | SUBCUTANEOUS | 2 refills | Status: DC
  Filled 2022-01-23: qty 6, 30d supply, fill #0
  Filled 2022-01-23: qty 15, 75d supply, fill #0
  Filled 2022-04-24: qty 15, 75d supply, fill #1

## 2022-01-22 MED ORDER — ATORVASTATIN CALCIUM 80 MG PO TABS
80.0000 mg | ORAL_TABLET | Freq: Every day | ORAL | 0 refills | Status: DC
  Filled 2022-01-22: qty 30, 30d supply, fill #0

## 2022-01-22 MED ORDER — CARVEDILOL 3.125 MG PO TABS
3.1250 mg | ORAL_TABLET | Freq: Two times a day (BID) | ORAL | 0 refills | Status: DC
  Filled 2022-01-22: qty 60, 30d supply, fill #0

## 2022-01-22 MED ORDER — ISOSORB DINITRATE-HYDRALAZINE 20-37.5 MG PO TABS
0.5000 | ORAL_TABLET | Freq: Three times a day (TID) | ORAL | 0 refills | Status: DC
  Filled 2022-01-22: qty 45, 30d supply, fill #0

## 2022-01-22 NOTE — Progress Notes (Signed)
?  Mobility Specialist Criteria Algorithm Info. ? ? 01/22/22 0945  ?Mobility  ?Activity Ambulated with assistance in hallway;Dangled on edge of bed  ?Range of Motion/Exercises Active;All extremities  ?Level of Assistance Modified independent, requires aide device or extra time  ?Assistive Device None  ?Distance Ambulated (ft) 500 ft  ?Activity Response Tolerated well  ? ?Patient received dangling EOB agreeable to participate in mobility. Ambulated in hallway mod I with steady gait. Tolerated ambulation well without complaint or incident. Was left dangling EOB with with all needs met, call bell in reach.  ? ?01/22/2022 ?10:00 AM ? ?Timothy Lambert, CMS, BS EXP ?Acute Rehabilitation Services  ?HWKGS:[phone] ?Office: [phone] ? ?

## 2022-01-22 NOTE — Assessment & Plan Note (Signed)
Anemia of chronic disease. ?Hgb has been stable, follow up as outpatient.  ?

## 2022-01-22 NOTE — Care Management (Signed)
I went through the discharge paperwork with Mr Troiano and his wife and both confirmed they understood the contents. ? ?Elmyra Ricks, RN ?

## 2022-01-22 NOTE — Progress Notes (Signed)
Patient ID: Timothy Lambert, male   DOB: 10-12-1955, 66 y.o.   MRN: [id number] ?[facility] ?Progress Note  ? ?Assessment/ Plan:   ?1. Acute kidney Injury on CKD4: Baseline creatinine 2.5-2.8. AKI from ATN in the setting of DKA/shock. Renal function again appears essentially unchanged overnight-unclear if he has had progression of his underlying chronic kidney disease but currently appears to have been in the plateau phase of ATN.  Off furosemide at this time with fair UOP-recommend deferring restart of his diuretics until outpatient follow-up in 1 to 2 weeks.  I will get him outpatient follow-up with Dr. Johnney Ou at [facility]. ?2. DKA: with poorly controlled underlying DM and DKA likely from non-adherence with diet/medications.  ?3. Combined systolic/diastolic CHF: Clinically appears to be compensated at this time, off diuretics at this time and would recommend restarting diuretics either at low-dose 20 mg furosemide daily at the time of discharge or deferring it to outpatient follow-up in 1 to 2 weeks. ?4. Community acquired pneumonia: Afebrile and with improved WBC count s/p ceftriaxone and azithromycin.  ?5. Anemia: likely secondary to chronic disease and exacerbated by acute illness/hospitalization. Will monitor trend.  ? ?Subjective:   ?He states that he had a good night and feels better this morning.  Denies chest pain or shortness of breath.   ? ?Objective:   ?BP 117/65 (BP Location: Left Arm)   Pulse 81   Temp 98.6 ?F (37 ?C) (Oral)   Resp 15   Ht 5\' 7"  (1.702 m)   Wt 76 kg   SpO2 95%   BMI 26.24 kg/m?  ? ?Intake/Output Summary (Last 24 hours) at 01/22/2022 0817 ?Last data filed at 01/22/2022 0709 ?Gross per 24 hour  ?Intake 240 ml  ?Output 2275 ml  ?Net -2035 ml  ? ?Weight change: 0.2 kg ? ?Physical Exam: ?Gen: Sitting up on the side of his bed, getting ready to eat breakfast ?CVS: Pulse regular rhythm and normal rate, S1 and S2 normal ?Resp: Manage breath sounds over bases-poor  inspiratory effort, no distinct rales or rhonchi ?Abd: Soft, flat, non-tender ?Ext: Trace lower extremity edema, wearing compression stockings. ? ?Imaging: ?No results found. ? ?Labs: ?BMET ?Recent Labs  ?Lab 01/16/22 ?2344 01/17/22 ?9476 01/18/22 ?5465 01/19/22 ?0354 01/20/22 ?6568 01/21/22 ?0119 01/22/22 ?0037  ?NA 134* 135 136 133* 132* 134* 134*  ?K 4.8 4.7 4.3 4.1 4.2 4.3 4.6  ?CL 104 104 105 100 99 99 100  ?CO2 18* 18* 20* 19* 22 23 24   ?GLUCOSE 235* 192* 81 133* 151* 288* 273*  ?BUN 89* 94* 114* 116* 113* 107* 95*  ?CREATININE 4.45* 4.49* 4.58* 4.36* 4.45* 4.39* 4.44*  ?CALCIUM 8.7* 8.7* 8.5*  8.4* 8.5* 8.8* 8.9 8.6*  ?PHOS 4.3  --  5.3* 4.7*  --   --   --   ? ?CBC ?Recent Labs  ?Lab 01/16/22 ?0258 01/16/22 ?2344 01/18/22 ?1275 01/20/22 ?0108  ?WBC 10.2 14.8* 12.5* 7.5  ?HGB 10.1* 9.4* 9.4* 10.2*  ?HCT 29.8* 26.9* 27.5* 30.1*  ?MCV 87.6 85.9 86.5 85.0  ?PLT 174 184 199 206  ? ?Medications:   ? ? amiodarone  400 mg Oral BID  ? apixaban  5 mg Oral BID  ? aspirin EC  81 mg Oral Daily  ? atorvastatin  80 mg Oral Daily  ? carvedilol  3.125 mg Oral BID WC  ? insulin aspart  0-9 Units Subcutaneous TID WC  ? insulin glargine-yfgn  20 Units Subcutaneous QHS  ? isosorbide-hydrALAZINE  0.5 tablet  Oral TID  ? multivitamin with minerals  1 tablet Oral Daily  ? Ensure Max Protein  11 oz Oral BID  ? ?Elmarie Shiley, MD ?01/22/2022, 8:17 AM  ? ? ?

## 2022-01-22 NOTE — Progress Notes (Signed)
SATURATION QUALIFICATIONS: (This note is used to comply with regulatory documentation for home oxygen) ? ?Patient Saturations on Room Air at Rest = 94% ? ?Patient Saturations on Room Air while Ambulating = 93% ? ?Patient Saturations on 0 Liters of oxygen while Ambulating = n/a% ? ?Please briefly explain why patient needs home oxygen: ?

## 2022-01-22 NOTE — Progress Notes (Addendum)
Patient ID: Timothy Lambert, male   DOB: Mar 18, 1956, 66 y.o.   MRN: [id number] ? ? ? ?Advanced Heart Failure Rounding Note ? ? ?Subjective:   ? ?Scr remains stable off diuretic. 1500 cc UOP charted yesterday.  ? ?Scr 4.49 -> 4.45 -> 4.59 -> 4.36 -> 4.45 -> 4.39 -> 4.44 ? ?Maintaining SR on P.O. amio ? ?Feels great. No dyspnea. Ready to go home. ?  ? ?Objective:   ?Weight Range: 177>>167 ? ?Vital Signs:   ?Temp:  [98.1 ?F (36.7 ?C)-98.6 ?F (37 ?C)] 98.6 ?F (37 ?C) (05/04 0343) ?Pulse Rate:  [76-81] 81 (05/04 0343) ?Resp:  [13-17] 15 (05/04 0343) ?BP: (117-130)/(61-74) 117/65 (05/04 0343) ?SpO2:  [92 %-98 %] 95 % (05/04 0343) ?Weight:  [76 kg] 76 kg (05/04 0347) ?Last BM Date : 01/20/22 ? ?Weight change: ?Filed Weights  ? 01/20/22 0344 01/21/22 0417 01/22/22 0347  ?Weight: 77.9 kg 75.8 kg 76 kg  ? ? ?Intake/Output:  ? ?Intake/Output Summary (Last 24 hours) at 01/22/2022 0702 ?Last data filed at 01/21/2022 2310 ?Gross per 24 hour  ?Intake 240 ml  ?Output 1500 ml  ?Net -1260 ml  ?  ? ?Physical Exam: ?General: No distress. Sitting up on side of bed. ?HEENT: normal ?Neck: supple. no JVD. Carotids 2+ bilat; no bruits. ?Cor: PMI nondisplaced. Regular rate & rhythm. No rubs, gallops or murmurs. ?Lungs: clear ?Abdomen: soft, nontender, nondistended. ?Extremities: no cyanosis, clubbing, rash, edema ?Neuro: alert & orientedx3, cranial nerves grossly intact. moves all 4 extremities w/o difficulty. Affect pleasant ? ? ?Telemetry: NSR 70s-80s (personally reviewed) ? ?Labs: ?Basic Metabolic Panel: ?Recent Labs  ?Lab 01/16/22 ?0258 01/16/22 ?0501 01/16/22 ?2344 01/17/22 ?6433 01/18/22 ?2951 01/19/22 ?8841 01/20/22 ?6606 01/21/22 ?0119 01/22/22 ?0037  ?NA  --    < > 134* 135 136 133* 132* 134* 134*  ?K  --    < > 4.8 4.7 4.3 4.1 4.2 4.3 4.6  ?CL  --    < > 104 104 105 100 99 99 100  ?CO2  --    < > 18* 18* 20* 19* 22 23 24   ?GLUCOSE  --    < > 235* 192* 81 133* 151* 288* 273*  ?BUN  --    < > 89* 94* 114* 116* 113* 107* 95*  ?CREATININE  3.93*   < > 4.45* 4.49* 4.58* 4.36* 4.45* 4.39* 4.44*  ?CALCIUM  --    < > 8.7* 8.7* 8.5*  8.4* 8.5* 8.8* 8.9 8.6*  ?MG 1.6*  --  1.8 1.8 1.9 1.9  --   --   --   ?PHOS  --   --  4.3  --  5.3* 4.7*  --   --   --   ? < > = values in this interval not displayed.  ? ? ?Liver Function Tests: ?Recent Labs  ?Lab 01/16/22 ?0501  ?AST 58*  ?ALT 65*  ?ALKPHOS 46  ?BILITOT 1.4*  ?PROT 6.0*  ?ALBUMIN 3.0*  ? ?No results for input(s): LIPASE, AMYLASE in the last 168 hours. ?No results for input(s): AMMONIA in the last 168 hours. ? ?CBC: ?Recent Labs  ?Lab 01/15/22 ?2304 01/16/22 ?0215 01/16/22 ?0258 01/16/22 ?2344 01/18/22 ?3016 01/20/22 ?0108  ?WBC 13.7*  --  10.2 14.8* 12.5* 7.5  ?HGB 11.2* 8.8* 10.1* 9.4* 9.4* 10.2*  ?HCT 33.4* 26.0* 29.8* 26.9* 27.5* 30.1*  ?MCV 88.4  --  87.6 85.9 86.5 85.0  ?PLT 185  --  174 184 199 206  ? ? ?Cardiac  Enzymes: ?No results for input(s): CKTOTAL, CKMB, CKMBINDEX, TROPONINI in the last 168 hours. ? ?BNP: ?BNP (last 3 results) ?Recent Labs  ?  01/15/22 ?2304  ?BNP 3,498.1*  ? ? ?ProBNP (last 3 results) ?No results for input(s): PROBNP in the last 8760 hours. ? ? ? ?Other results: ? ?Imaging: ?No results found. ? ? ?Medications:   ? ? ?Scheduled Medications: ? amiodarone  400 mg Oral BID  ? apixaban  5 mg Oral BID  ? aspirin EC  81 mg Oral Daily  ? atorvastatin  80 mg Oral Daily  ? carvedilol  3.125 mg Oral BID WC  ? insulin aspart  0-9 Units Subcutaneous TID WC  ? insulin glargine-yfgn  20 Units Subcutaneous QHS  ? isosorbide-hydrALAZINE  0.5 tablet Oral TID  ? multivitamin with minerals  1 tablet Oral Daily  ? Ensure Max Protein  11 oz Oral BID  ? ? ?Infusions: ? ? ? ?PRN Medications: ?acetaminophen **OR** acetaminophen, albuterol ? ? ?Assessment/Plan:  ? ?1. Shock: In setting of DKA.  Refused central access.  Echo with EF 40-45% range. Now resolved.  ?2. 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  Echo in 8/16 with EF 50-55%.   Echo this admission showed EF 40-45% with apical HK, mild LVH, normal RV, dilated IVC. Picture complicated by AKI.  On exam, he does not look volume overloaded at this point and he is back on room air.  Creatinine fairly stable today at 4.45  => 4.39 => 4.44.   ?- Diuretics remain on hold. Likely need PRN diuretic at discharge ?- Continue Bidil 0.5 tab tid and Coreg 3.125 mg bid.   ?- Would have liked to have had central access for CVP monitoring and co-ox.  He refused CVL offered by CCM.  Renal ok'd PICC, he refused this as well.   ?3. CAD with suspectetd NSTEMI: Anterior STEMI in 2016, DES to LAD.  Has been off all meds x 1 month.  He denies chest pain (only dyspnea), but ECG is concerning with STE in AVR and ST depression laterally (inferior Qs were on prior ECG as well). [place] up to 138[address].  Possible demand ischemia with DKA and hypotension, but degree of troponin elevation is more suggestive of NSTEMI with ACS.  Echo showed apical hypokinesis (EF 40-45%).  He was not cathed due to lack of chest pain and creatinine up to > 4.  ?- In absence of chest pain, would hold off on cath at this time given markedly elevated creatinine.   ?- Continue ASA 81 and atorvastatin 80 mg daily.  ?4. AKI on CKD stage IV: Baseline creatinine 2.7-3, currently 4.45 => 4.39 => 4.44, appears to have reached a plateau.  Suspect due to DKA/hypotension. Baseline diabetic nephropathy.  ?- he discussed with his wife whether or not he would accept HD if needed and says he would not want it now. I asked if that would change if it were a matter of life and death and he said maybe ?- No immediate need for HD, hopefully creatinine will start to trend down over time.  ?5. Hyperkalemia: In setting of DKA. Resolved.  ?6. Pulmonary: Initial CXR with pulmonary edema.  Completed abx for PNA. Now on room air.  ?7. DM: Presented with DKA. Off his home meds (insulin included) for at least 1 month.  ?- Per CCM/internal medicine.  ?8. H/o ETOH abuse: Per  wife, drinks fairly heavily still.  ?  9. Atrial fibrillation: Remote history of PAF with CVA.  He was not anticoagulated at that time due to ETOH abuse. Had atrial fibrillation this admission.  He is now back in NSR.  ?- Continue po amiodarone 400 mg BID, can reduce dose at hospital follow-up. ?- Continue Eliquis.   ?10. [employer] ? ?Debility: ?-PT/OT evaluated. Recommending HH PT/OT at discharge ? ?GOC: ?-DNR ?-Palliative Care has seen this admit ? ? ?Okay for discharge today from HF standpoint. ? ?Will arrange f/u in HF clinic. ? ?HF meds: ?Amiodarone 400 mg BID x 1 week then 200 mg bid x 1 week then 200 mg daily ?Apixaban 5 mg BID ?ASA 81 mg daily, stop after 1 month given apixaban use ?Atorvastatin 80 mg daily ?Coreg 3.125 mg BID ?Bidil 0.5 tablets TID ?Lasix 40 mg po daily prn weight gain 2 lbs in a day, 3 lbs in a week. ? ?Length of Stay: 6 ? ? ?FINCH, LINDSAY N PA-C ?01/22/2022, 7:02 AM ? ?Advanced Heart Failure Team ?Pager [phone] (M-F; 7a - 4p)  ?Please contact [facility] for night-coverage after hours (4p -7a ) and weekends on [email or web address]  ? ?Patient seen with PA, agree with the above note.  ? ?Creatinine appears to have plateaued, 4.44 today.  He feels good, no dyspnea or chest pain.  He remains in NSR.  ? ?General: NAD ?Neck: No JVD, no thyromegaly or thyroid nodule.  ?Lungs: Clear to auscultation bilaterally with normal respiratory effort. ?CV: Nondisplaced PMI.  Heart regular S1/S2, no S3/S4, no murmur.  No peripheral edema.   ?Abdomen: Soft, nontender, no hepatosplenomegaly, no distention.  ?Skin: Intact without lesions or rashes.  ?Neurologic: Alert and oriented x 3.  ?Psych: Normal affect. ?Extremities: No clubbing or cyanosis.  ?HEENT: Normal.  ? ?Volume status is ok.  Think he can use prn Lasix at home.  ? ?I think patient is ready for discharge today.  He can go home on the above cardiac meds with amiodarone taper.  He will need close followup with CHF clinic, needs nephrology followup,  and needs close PCP management of his diabetes.   ? ?Loralie Champagne ?01/22/2022 ?8:19 AM ? ?

## 2022-01-22 NOTE — TOC CM/SW Note (Signed)
Spoke to pt and lives with wife. Will contact [facility] to make aware of dc home today with HH. Jonnie Finner RN3 CCM, Heart Failure TOC CM [phone]  ?

## 2022-01-22 NOTE — Discharge Summary (Addendum)
Physician Discharge Summary   Patient: Timothy Lambert MRN: [id number] DOB: 1956/09/18  Admit date:     01/15/2022  Discharge date: 01/22/22  Discharge Physician: Timothy Lambert   PCP: Timothy Matar, MD   Recommendations at discharge:    Patient will resume furosemide as needed in case of weight gain 2 to 3 lbs in 24 to 48hrs, or 5 lbs in 7 days. Continue carvedilol and started on Bidil. Follow up renal function and electrolytes in 7 days as outpatient.  Increased atorvastatin to 80 mg daily Started on apixaban for anticoagulation, discontinue aspirin in 30 days.   Discharge Diagnoses: Principal Problem:   Acute on chronic systolic CHF (congestive heart failure) (HCC) Active Problems:   Non-ST elevation (NSTEMI) myocardial infarction (HCC)   Paroxysmal atrial fibrillation (HCC)   Acute kidney injury superimposed on chronic kidney disease (HCC)   Essential hypertension   Type 2 diabetes mellitus with hyperlipidemia (HCC)   Lobar pneumonia (HCC)   Alcohol abuse  Resolved Problems:   * No resolved hospital problems. [place] [facility] Course: [name] was admitted to the hospital with the working diagnosis of decompensated heart failure. Complicated with respiratory failure, NSTEMI, pneumonia and DKA.   66 y.o. 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 reported one week of worsening dyspnea and cough, along with lower extremity edema. In the ED he was hypoxic with escalating oxygen requirements, ultimately placed on BiPAP. Lungs with no wheezing, heart with S1 and S2 present with no gallops, rubs or murmurs, abdomen with no distention, and positive bilateral lower extremity edema.   Na 134, K 5,4 Cl 104, bicarbonate 17, glucose 422, bun 61 cr 3,79 anion gap 13  AST 58 ALT 65 BNP 3,498 High sensitive troponin 11,409 and 13,838 Wbc 13,7 hgb 11,2 plt  185 Sars covid 19 negative   Urine analysis SG 1,012, 30 protein, 0-5 wbc and 0-5 rbc   Chest radiograph with cardiomegaly, bilateral hilar vascular congestin, small right pleural effusion.   EKG 99 bpm, normal axis, normal intervals, sinus rhythm with poor R wave progression, q wave lead III and Avf, ST depression in lead I and AvL, with T wave inversions lead I, AVL, V5 to V6.   Patient developed shock and was admitted to the ICU.  Treated with high-dose IV diuretics by heart failure team and nephrology Anticoagulation with heparin for NSTEMI. Insulin drip for DKA   Echocardiogram with reduced LV systolic function.   Patient developed atrial fibrillation with RVR, placed on amiodarone with good toleration.  Transferred out of ICU to [facility] service 5/2  Plan to discharge home and have close follow up as outpatient with cardiology, nephrology and primary care. Resume taking furosemide a needed for volume overload.   Assessment and Plan: * Acute on chronic systolic CHF (congestive heart failure) (HCC) Sp recovered shock, cardiogenic, hypovolemic.  Patient has achieved euvolemia, his fluid balanced at the time of his discharge is negative -5,521 ml. Blood pressure is 117 to 122 mmHg range.   Echocardiogram with mild reduction in systolic function with EF 40 to 45%. Akinetic inferior wall and basal inferolateral segment. Preserved RV systolic function. RVSP 40,0 mmHg. No significant valvular disease.   Patient will continue heart failure management with carvedilol, and hydralazine isosorbide combination (bidil).  Diuresis with furosemide as needed for signs of volume overload.   Non-ST elevation (  NSTEMI) myocardial infarction (HCC) High sensitive troponin peaked at 138[address].  Positive wall motion abnormalities with inferior wall and basal inferolateral segment akinetic on the left ventricle.  Not candidate for invasive contrast procedures due to reduced GFR. Patient was medically treated  with IV heparin and B blockade, along with aspirin and statin.   Increased statin to atorvastatin 80 mg, continue with aspirin for one more month and then discontinue in the setting of full anticoagulation with apixaban.   Paroxysmal atrial fibrillation [facility]) Patient developed acute atrial fibrillation with RVR.  He was placed on  Amiodarone IV with good toleration. At home will continue with amiodarone as instructed. Anticoagulation with apixaban.   At the time of his discharge patient is on sinus rhythm.   Acute kidney injury superimposed on chronic kidney disease (HCC) CKD stage 4. Hyponatremia  Positive acute tubular necrosis, recovering.   Patient had close monitoring of his renal function, at the time of his discharge his serum cr is 4,4 with K at 4,6 and serum bicarbonate at 24. Na ia 134.   Plan to resume diuresis as needed with furosemide 20 mg.  Follow up with nephrology as outpatient, renal function and electrolytes.   Essential hypertension Continue blood pressure control with carvedilol and bidil.   Type 2 diabetes mellitus with hyperlipidemia (HCC) Sp recovered DKA on admission. Patient is tolerating po well fasting glucose is 288 consistent with uncontrolled hyperglycemia.   Plan to continue insulin therapy. Close follow up as outpatient.  His fasting glucose at his discharge is 273  Considering low GFR will continue with reduced dose of basal insulin 10 units for now.  Dyslipidemia, continue with statin therapy.   Lobar pneumonia [facility]) Patient has been treated with 5 days of antibiotic therapy for community acquired pneumonia, present on admission.   Alcohol abuse No clinical sings of withdrawal, continue with neuro checks.   Anemia Anemia of chronic disease. Hgb has been stable, follow up as outpatient.          Consultants: cardiology, nephrology  Procedures performed: none   Disposition: Home Diet recommendation:  Discharge Diet Orders (From  admission, onward)     Start     Ordered   01/22/22 0000  Diet - low sodium heart healthy        01/22/22 1246           Cardiac diet DISCHARGE MEDICATION: Allergies as of 01/22/2022       Reactions   Trulicity [dulaglutide]    Upset his stomach        Medication List     STOP taking these medications    amLODipine 10 MG tablet Commonly known as: NORVASC   hydrALAZINE 50 MG tablet Commonly known as: APRESOLINE   isosorbide dinitrate 20 MG tablet Commonly known as: ISORDIL   lisinopril 40 MG tablet Commonly known as: ZESTRIL       TAKE these medications    Accu-Chek Guide test strip Generic drug: glucose blood Use as instructed 3 times daily DX code E11.9   acetaminophen 325 MG tablet Commonly known as: TYLENOL Take 2 tablets (650 mg total) by mouth every 6 (six) hours as needed for mild pain (or Fever >/= 101).   amiodarone 200 MG tablet Commonly known as: PACERONE Take 2 tablets twice daily until 01/29/22, then take 2 tablets once daily from 01/30/22 to 02/05/22, then on 02/06/22 start taking 1 tablet daily.   apixaban 5 MG Tabs tablet Commonly known as: ELIQUIS Take 1 tablet (  5 mg total) by mouth 2 (two) times daily.   aspirin EC 81 MG tablet Take 1 tablet (81 mg total) by mouth daily. Stop taking on 02/22/22 What changed: additional instructions   atorvastatin 80 MG tablet Commonly known as: LIPITOR Take 1 tablet (80 mg total) by mouth daily. Start taking on: Jan 23, 2022 What changed:  medication strength how much to take   carvedilol 3.125 MG tablet Commonly known as: COREG Take 1 tablet (3.125 mg total) by mouth 2 (two) times daily with a meal. What changed:  medication strength how much to take how to take this when to take this   Ensure Max Protein Liqd Take 330 mLs (11 oz total) by mouth 2 (two) times daily.   furosemide 20 MG tablet Commonly known as: LASIX Take 1 tablet (20 mg total) by mouth daily as needed for fluid or  edema (in case of weight gain 2 to 3 lbs in 24 to 48 hrs or 5 lbs in one week, in case of leg swelling or shortbess of breath.). What changed:  how much to take when to take this reasons to take this   INSULIN SYRINGE .5CC/30GX1/2" 30G X 1/2" 0.5 ML Misc Check blood sugar TID & QHS   isosorbide-hydrALAZINE 20-37.5 MG tablet Commonly known as: BIDIL Take 1/2 tablets by mouth 3 (three) times daily.   Lantus SoloStar 100 UNIT/ML Solostar Pen Generic drug: insulin glargine Inject 20 Units into the skin daily. What changed: how much to take   multivitamin with minerals Tabs tablet Take 1 tablet by mouth daily. Start taking on: Jan 23, 2022   TechLite Pen Needles 32G X 4 MM Misc Generic drug: Insulin Pen Needle Use as directed for insulin   TRUEplus Lancets 28G Misc 1 each by Does not apply route 3 (three) times daily before meals.   accu-chek soft touch lancets Use as instructed   accu-chek soft touch lancets Use as instructed to check blood sugar 3 times daily Dx code E11.9        Follow-up Information     [facility] Follow up on 01/30/2022.   Specialty: Cardiology Why: [facility] 9:30 am Entrance C, Garage Code 1002 Contact information: [address] [id number] [place] [address] [phone]        [facility] Follow up.   Why: Home Health -agency will call to arrange appt Contact information: [phone]               Discharge Exam: Filed Weights   01/20/22 0344 01/21/22 0417 01/22/22 0347  Weight: 77.9 kg 75.8 kg 76 kg   BP 122/69 (BP Location: Left Arm)   Pulse 78   Temp 98.1 F (36.7 C) (Oral)   Resp 18   Ht 5\' 7"  (1.702 m)   Wt 76 kg   SpO2 95%   BMI 26.24 kg/m   Patient is feeling well, no chest pain, dyspnea or edema  Neurology awake and alert ENT with mild pallor Cardiovascular with S1 and S2 present and regular rhythm, with no gallops, rubs  or murmurs No JVD No lower extremity edema Respiratory with no rales or wheezing  Abdomen not distended   Condition at discharge: stable  The results of significant diagnostics from this hospitalization (including imaging, microbiology, ancillary and laboratory) are listed below for reference.   Imaging Studies: DG Chest 2 View  Result Date: 01/19/2022 CLINICAL DATA:  Pneumonia.  No chest  complaints at this time. EXAM: CHEST - 2 VIEW COMPARISON:  AP chest 01/15/2022, chest two views 02/19/2015, AP chest 01/28/2015 FINDINGS: Cardiac silhouette is again mildly to moderately enlarged. Mild calcification within the aortic arch. Mildly decreased lung volumes. Improved aeration of the upper lungs. Small bilateral pleural effusions with bibasilar atelectasis. No pneumothorax. Mild-to-moderate multilevel degenerative disc changes of the thoracic spine. IMPRESSION: Mildly decreased lung volumes with small bilateral pleural effusions and bibasilar subsegmental atelectasis. Interval improvement in the prior bilateral interstitial thickening/interstitial pulmonary edema. Electronically Signed   By: Neita Garnet M.D.   On: 01/19/2022 16:18   US RENAL  Result Date: 01/16/2022 CLINICAL DATA:  Acute kidney injury EXAM: RENAL / URINARY TRACT ULTRASOUND COMPLETE COMPARISON:  April 19, 2018 FINDINGS: Right Kidney: Renal measurements: 9.9 x 4.9 x 5.4 cm = volume: 135.5 mL. Echogenicity within normal limits. No mass or hydronephrosis visualized. Left Kidney: Renal measurements: 10.2 x 5.6 x 6.4 cm (volume = 190 cm^3) ml. Echogenicity within normal limits. No mass or hydronephrosis visualized. Bladder: Appears normal for degree of bladder distention. Other: Bilateral small pleural effusion. IMPRESSION: Unremarkable ultrasound imaging of the kidneys for patient's age. Small bilateral pleural effusion. Electronically Signed   By: Marjo Bicker M.D.   On: 01/16/2022 16:59   DG Chest Portable 1 View  Result Date:  01/15/2022 CLINICAL DATA:  COVID pneumonia, hypoxia EXAM: PORTABLE CHEST 1 VIEW COMPARISON:  02/19/2015 FINDINGS: Pulmonary insufflation is normal and symmetric. There are of developed extensive bilateral perihilar and lower lung zone pulmonary infiltrates,. These are more suggestive of changes related to pulmonary edema, though infection is not excluded. Suspect small bilateral pleural effusion. Cardiac size is mildly enlarged. Central pulmonary arteries are enlarged in keeping with changes of pulmonary arterial hypertension. No pneumothorax. IMPRESSION: Mid and lower lung zone pulmonary infiltrates, possible small bilateral pleural effusions, and mild cardiomegaly suggesting changes of mild to moderate cardiogenic failure, though pneumonic infiltrate is not completely excluded. Electronically Signed   By: Helyn Numbers M.D.   On: 01/15/2022 23:05   ECHOCARDIOGRAM COMPLETE  Result Date: 01/16/2022    ECHOCARDIOGRAM REPORT   Patient Name:   COLSTEN HYKE Date of Exam: 01/16/2022 Medical Rec #:  [id number]         Height:       67.0 in Accession #:    [id number]        Weight:       177.2 lb Date of Birth:  Mar 26, 1956          BSA:          1.921 m Patient Age:    66 years          BP:           100/69 mmHg Patient Gender: M                 HR:           81 bpm. Exam Location:  Inpatient Procedure: 2D Echo Indications:    CHF  History:        Patient has prior history of Echocardiogram examinations, most                 recent 05/02/2015. CHF, CAD and Previous Myocardial Infarction;                 Risk Factors:Hypertension and Diabetes.  Sonographer:    Devonne Doughty Referring Phys: [id number] Eduard Clos  Sonographer Comments: Image acquisition challenging due to respiratory  motion and supine. IMPRESSIONS  1. Left ventricular ejection fraction, by estimation, is 40 to 45%. The left ventricle has mildly decreased function. The left ventricle has no regional wall motion abnormalities. There is mild left  ventricular hypertrophy. Left ventricular diastolic parameters are consistent with Grade II diastolic dysfunction (pseudonormalization).  2. Right ventricular systolic function is normal. The right ventricular size is normal. There is mildly elevated pulmonary artery systolic pressure. The estimated right ventricular systolic pressure is 40.0 mmHg.  3. Left atrial size was mildly dilated.  4. The mitral valve is normal in structure. Mild mitral valve regurgitation. No evidence of mitral stenosis.  5. The aortic valve is calcified. There is mild calcification of the aortic valve. There is mild thickening of the aortic valve. Aortic valve regurgitation is not visualized. Aortic valve sclerosis/calcification is present, without any evidence of aortic stenosis.  6. The inferior vena cava is dilated in size with <50% respiratory variability, suggesting right atrial pressure of 15 mmHg. FINDINGS  Left Ventricle: Left ventricular ejection fraction, by estimation, is 40 to 45%. The left ventricle has mildly decreased function. The left ventricle has no regional wall motion abnormalities. The left ventricular internal cavity size was normal in size. There is mild left ventricular hypertrophy. Left ventricular diastolic parameters are consistent with Grade II diastolic dysfunction (pseudonormalization).  LV Wall Scoring: The inferior wall and basal inferolateral segment are akinetic. Right Ventricle: The right ventricular size is normal. No increase in right ventricular wall thickness. Right ventricular systolic function is normal. There is mildly elevated pulmonary artery systolic pressure. The tricuspid regurgitant velocity is 2.50  m/s, and with an assumed right atrial pressure of 15 mmHg, the estimated right ventricular systolic pressure is 40.0 mmHg. Left Atrium: Left atrial size was mildly dilated. Right Atrium: Right atrial size was normal in size. Pericardium: There is no evidence of pericardial effusion. Mitral  Valve: The mitral valve is normal in structure. Mild mitral valve regurgitation. No evidence of mitral valve stenosis. Tricuspid Valve: The tricuspid valve is normal in structure. Tricuspid valve regurgitation is mild . No evidence of tricuspid stenosis. Aortic Valve: The aortic valve is calcified. There is mild calcification of the aortic valve. There is mild thickening of the aortic valve. Aortic valve regurgitation is not visualized. Aortic valve sclerosis/calcification is present, without any evidence of aortic stenosis. Aortic valve mean gradient measures 5.0 mmHg. Aortic valve peak gradient measures 8.5 mmHg. Aortic valve area, by VTI measures 2.02 cm. Pulmonic Valve: The pulmonic valve was normal in structure. Pulmonic valve regurgitation is not visualized. No evidence of pulmonic stenosis. Aorta: The aortic root is normal in size and structure. Venous: The inferior vena cava is dilated in size with less than 50% respiratory variability, suggesting right atrial pressure of 15 mmHg. IAS/Shunts: No atrial level shunt detected by color flow Doppler.  LEFT VENTRICLE PLAX 2D LVIDd:         5.20 cm   Diastology LVIDs:         4.10 cm   LV e' medial:    5.44 cm/s LV PW:         1.10 cm   LV E/e' medial:  15.8 LV IVS:        1.20 cm   LV e' lateral:   9.36 cm/s LVOT diam:     2.00 cm   LV E/e' lateral: 9.2 LV SV:         65 LV SV Index:   34 LVOT Area:  3.14 cm  RIGHT VENTRICLE             IVC RV Basal diam:  3.80 cm     IVC diam: 2.30 cm RV Mid diam:    3.40 cm RV S prime:     11.50 cm/s TAPSE (M-mode): 1.6 cm LEFT ATRIUM             Index        RIGHT ATRIUM           Index LA diam:        4.40 cm 2.29 cm/m   RA Area:     18.10 cm LA Vol (A2C):   60.4 ml 31.44 ml/m  RA Volume:   49.60 ml  25.82 ml/m LA Vol (A4C):   54.8 ml 28.53 ml/m LA Biplane Vol: 58.3 ml 30.35 ml/m  AORTIC VALVE                     PULMONIC VALVE AV Area (Vmax):    2.13 cm      PV Vmax:       0.90 m/s AV Area (Vmean):   2.14 cm       PV Peak grad:  3.2 mmHg AV Area (VTI):     2.02 cm AV Vmax:           146.00 cm/s AV Vmean:          104.000 cm/s AV VTI:            0.320 m AV Peak Grad:      8.5 mmHg AV Mean Grad:      5.0 mmHg LVOT Vmax:         99.20 cm/s LVOT Vmean:        70.700 cm/s LVOT VTI:          0.206 m LVOT/AV VTI ratio: 0.64  AORTA Ao Root diam: 2.90 cm Ao Asc diam:  3.10 cm MITRAL VALVE               TRICUSPID VALVE MV Area (PHT): 5.58 cm    TR Peak grad:   25.0 mmHg MV Decel Time: 136 msec    TR Vmax:        250.00 cm/s MV E velocity: 86.10 cm/s MV A velocity: 59.10 cm/s  SHUNTS MV E/A ratio:  1.46        Systemic VTI:  0.21 m                            Systemic Diam: 2.00 cm Donato Schultz MD Electronically signed by Donato Schultz MD Signature Date/Time: 01/16/2022/10:29:33 AM    Final     Microbiology: Results for orders placed or performed during the hospital encounter of 01/15/22  Resp Panel by RT-PCR (Flu A&B, Covid) Nasopharyngeal Swab     Status: None   Collection Time: 01/16/22 12:08 AM   Specimen: Nasopharyngeal Swab; Nasopharyngeal(NP) swabs in vial transport medium  Result Value Ref Range Status   SARS Coronavirus 2 by RT PCR NEGATIVE NEGATIVE Final    Comment: (NOTE) SARS-CoV-2 target nucleic acids are NOT DETECTED.  The SARS-CoV-2 RNA is generally detectable in upper respiratory specimens during the acute phase of infection. The lowest concentration of SARS-CoV-2 viral copies this assay can detect is 138 copies/mL. A negative result does not preclude SARS-Cov-2 infection and should not be used as the sole basis for treatment or other patient management  decisions. A negative result may occur with  improper specimen collection/handling, submission of specimen other than nasopharyngeal swab, presence of viral mutation(s) within the areas targeted by this assay, and inadequate number of viral copies(<138 copies/mL). A negative result must be combined with clinical observations, patient history, and  epidemiological information. The expected result is Negative.  Fact Sheet for Patients:  BloggerCourse.com  Fact Sheet for Healthcare Providers:  SeriousBroker.it  This test is no t yet approved or cleared by the [place] FDA and  has been authorized for detection and/or diagnosis of SARS-CoV-2 by FDA under an Emergency Use Authorization (EUA). This EUA will remain  in effect (meaning this test can be used) for the duration of the COVID-19 declaration under Section 564(b)(1) of the Act, 21 U.S.C.section 360bbb-3(b)(1), unless the authorization is terminated  or revoked sooner.       Influenza A by PCR NEGATIVE NEGATIVE Final   Influenza B by PCR NEGATIVE NEGATIVE Final    Comment: (NOTE) The Xpert Xpress SARS-CoV-2/FLU/RSV plus assay is intended as an aid in the diagnosis of influenza from Nasopharyngeal swab specimens and should not be used as a sole basis for treatment. Nasal washings and aspirates are unacceptable for Xpert Xpress SARS-CoV-2/FLU/RSV testing.  Fact Sheet for Patients: BloggerCourse.com  Fact Sheet for Healthcare Providers: SeriousBroker.it  This test is not yet approved or cleared by the [place] FDA and has been authorized for detection and/or diagnosis of SARS-CoV-2 by FDA under an Emergency Use Authorization (EUA). This EUA will remain in effect (meaning this test can be used) for the duration of the COVID-19 declaration under Section 564(b)(1) of the Act, 21 U.S.C. section 360bbb-3(b)(1), unless the authorization is terminated or revoked.  Performed at [facility], [address] [place], [place] [address]     Labs: CBC: Recent Labs  Lab 01/15/22 2304 01/16/22 0215 01/16/22 0258 01/16/22 2344 01/18/22 0546 01/20/22 0108  WBC 13.7*  --  10.2 14.8* 12.5* 7.5  HGB 11.2* 8.8* 10.1* 9.4* 9.4* 10.2*  HCT 33.4* 26.0* 29.8* 26.9*  27.5* 30.1*  MCV 88.4  --  87.6 85.9 86.5 85.0  PLT 185  --  174 184 199 206   Basic Metabolic Panel: Recent Labs  Lab 01/16/22 0258 01/16/22 0501 01/16/22 2344 01/17/22 0946 01/18/22 0546 01/19/22 0034 01/20/22 0108 01/21/22 0119 01/22/22 0037  NA  --    < > 134* 135 136 133* 132* 134* 134*  K  --    < > 4.8 4.7 4.3 4.1 4.2 4.3 4.6  CL  --    < > 104 104 105 100 99 99 100  CO2  --    < > 18* 18* 20* 19* 22 23 24   GLUCOSE  --    < > 235* 192* 81 133* 151* 288* 273*  BUN  --    < > 89* 94* 114* 116* 113* 107* 95*  CREATININE 3.93*   < > 4.45* 4.49* 4.58* 4.36* 4.45* 4.39* 4.44*  CALCIUM  --    < > 8.7* 8.7* 8.5*  8.4* 8.5* 8.8* 8.9 8.6*  MG 1.6*  --  1.8 1.8 1.9 1.9  --   --   --   PHOS  --   --  4.3  --  5.3* 4.7*  --   --   --    < > = values in this interval not displayed.   Liver Function Tests: Recent Labs  Lab 01/16/22 0501  AST 58*  ALT  65*  ALKPHOS 46  BILITOT 1.4*  PROT 6.0*  ALBUMIN 3.0*   CBG: Recent Labs  Lab 01/21/22 1142 01/21/22 1704 01/21/22 2106 01/22/22 0603 01/22/22 1103  GLUCAP 233* 222* 192* 232* 206*    Discharge time spent: greater than 30 minutes.  Signed: Coralie Keens, MD [facility] 01/22/2022

## 2022-01-23 ENCOUNTER — Telehealth: Payer: Self-pay

## 2022-01-23 ENCOUNTER — Other Ambulatory Visit: Payer: Self-pay

## 2022-01-23 ENCOUNTER — Other Ambulatory Visit (HOSPITAL_COMMUNITY): Payer: Self-pay

## 2022-01-23 ENCOUNTER — Telehealth: Payer: Self-pay | Admitting: Internal Medicine

## 2022-01-23 NOTE — Telephone Encounter (Signed)
Transition Care Management Follow-up Telephone Call ?  ?Date of discharge and from where:[facility] 01/22/2022 ?How have you been since you were released from the hospital? Pretty good ?Any questions or concerns? No questions/concerns reported.  ?Items Reviewed: ?Did the pt receive and understand the discharge instructions provided? have the instructions and have no questions.  ?Medications obtained and verified? He said that he will have his wife obtain the medications today. ?Any new allergies since your discharge? None reported  ?Do you have support at home? Yes, spouse ?Other (ie: DME, Home Health, etc)      ? NONE ?Functional Questionnaire: (I = Independent and D = Dependent) ?ADL's:  Independent.    ?  ?  ?Follow up appointments reviewed: ?  ??PCP Hospital f/u appt confirmed? Dr Wynetta Emery on 02/12/2022@ 1050.  ??[facility] f/u appt confirmed? None scheduled at this time  ??Are transportation arrangements needed? have transportation   ??If their condition worsens, is the pt aware to call  their PCP or go to the ED? yes ??Was the patient provided with contact information for the PCP's office or ED? He has the phone number  ??Was the pt encouraged to call back with questions or concerns?yes ?

## 2022-01-23 NOTE — Telephone Encounter (Signed)
Tiffany from [facility] Needs verbal approval asap to start services on Sunday for PT, Nursing and OT / please call ?

## 2022-01-25 DIAGNOSIS — I214 Non-ST elevation (NSTEMI) myocardial infarction: Secondary | ICD-10-CM | POA: Diagnosis not present

## 2022-01-25 DIAGNOSIS — E1165 Type 2 diabetes mellitus with hyperglycemia: Secondary | ICD-10-CM | POA: Diagnosis not present

## 2022-01-25 DIAGNOSIS — I130 Hypertensive heart and chronic kidney disease with heart failure and stage 1 through stage 4 chronic kidney disease, or unspecified chronic kidney disease: Secondary | ICD-10-CM | POA: Diagnosis not present

## 2022-01-25 DIAGNOSIS — I69351 Hemiplegia and hemiparesis following cerebral infarction affecting right dominant side: Secondary | ICD-10-CM | POA: Diagnosis not present

## 2022-01-25 DIAGNOSIS — Z794 Long term (current) use of insulin: Secondary | ICD-10-CM | POA: Diagnosis not present

## 2022-01-25 DIAGNOSIS — I5023 Acute on chronic systolic (congestive) heart failure: Secondary | ICD-10-CM | POA: Diagnosis not present

## 2022-01-25 DIAGNOSIS — E1122 Type 2 diabetes mellitus with diabetic chronic kidney disease: Secondary | ICD-10-CM | POA: Diagnosis not present

## 2022-01-25 DIAGNOSIS — I255 Ischemic cardiomyopathy: Secondary | ICD-10-CM | POA: Diagnosis not present

## 2022-01-25 DIAGNOSIS — Z9181 History of falling: Secondary | ICD-10-CM | POA: Diagnosis not present

## 2022-01-25 DIAGNOSIS — I69311 Memory deficit following cerebral infarction: Secondary | ICD-10-CM | POA: Diagnosis not present

## 2022-01-25 DIAGNOSIS — I5043 Acute on chronic combined systolic (congestive) and diastolic (congestive) heart failure: Secondary | ICD-10-CM | POA: Diagnosis not present

## 2022-01-25 DIAGNOSIS — K219 Gastro-esophageal reflux disease without esophagitis: Secondary | ICD-10-CM | POA: Diagnosis not present

## 2022-01-25 DIAGNOSIS — I2510 Atherosclerotic heart disease of native coronary artery without angina pectoris: Secondary | ICD-10-CM | POA: Diagnosis not present

## 2022-01-25 DIAGNOSIS — D631 Anemia in chronic kidney disease: Secondary | ICD-10-CM | POA: Diagnosis not present

## 2022-01-25 DIAGNOSIS — Z87891 Personal history of nicotine dependence: Secondary | ICD-10-CM | POA: Diagnosis not present

## 2022-01-25 DIAGNOSIS — I480 Paroxysmal atrial fibrillation: Secondary | ICD-10-CM | POA: Diagnosis not present

## 2022-01-25 DIAGNOSIS — Z7982 Long term (current) use of aspirin: Secondary | ICD-10-CM | POA: Diagnosis not present

## 2022-01-25 DIAGNOSIS — E1169 Type 2 diabetes mellitus with other specified complication: Secondary | ICD-10-CM | POA: Diagnosis not present

## 2022-01-25 DIAGNOSIS — N1832 Chronic kidney disease, stage 3b: Secondary | ICD-10-CM | POA: Diagnosis not present

## 2022-01-25 DIAGNOSIS — Z7901 Long term (current) use of anticoagulants: Secondary | ICD-10-CM | POA: Diagnosis not present

## 2022-01-25 DIAGNOSIS — E785 Hyperlipidemia, unspecified: Secondary | ICD-10-CM | POA: Diagnosis not present

## 2022-01-26 NOTE — Telephone Encounter (Signed)
Can I give verbal orders for this patient. ?

## 2022-01-27 ENCOUNTER — Telehealth: Payer: Self-pay | Admitting: Internal Medicine

## 2022-01-27 DIAGNOSIS — I214 Non-ST elevation (NSTEMI) myocardial infarction: Secondary | ICD-10-CM | POA: Diagnosis not present

## 2022-01-27 DIAGNOSIS — Z87891 Personal history of nicotine dependence: Secondary | ICD-10-CM | POA: Diagnosis not present

## 2022-01-27 DIAGNOSIS — I255 Ischemic cardiomyopathy: Secondary | ICD-10-CM | POA: Diagnosis not present

## 2022-01-27 DIAGNOSIS — E1122 Type 2 diabetes mellitus with diabetic chronic kidney disease: Secondary | ICD-10-CM | POA: Diagnosis not present

## 2022-01-27 DIAGNOSIS — Z7901 Long term (current) use of anticoagulants: Secondary | ICD-10-CM | POA: Diagnosis not present

## 2022-01-27 DIAGNOSIS — Z794 Long term (current) use of insulin: Secondary | ICD-10-CM | POA: Diagnosis not present

## 2022-01-27 DIAGNOSIS — I480 Paroxysmal atrial fibrillation: Secondary | ICD-10-CM | POA: Diagnosis not present

## 2022-01-27 DIAGNOSIS — D631 Anemia in chronic kidney disease: Secondary | ICD-10-CM | POA: Diagnosis not present

## 2022-01-27 DIAGNOSIS — K219 Gastro-esophageal reflux disease without esophagitis: Secondary | ICD-10-CM | POA: Diagnosis not present

## 2022-01-27 DIAGNOSIS — E1165 Type 2 diabetes mellitus with hyperglycemia: Secondary | ICD-10-CM | POA: Diagnosis not present

## 2022-01-27 DIAGNOSIS — I130 Hypertensive heart and chronic kidney disease with heart failure and stage 1 through stage 4 chronic kidney disease, or unspecified chronic kidney disease: Secondary | ICD-10-CM | POA: Diagnosis not present

## 2022-01-27 DIAGNOSIS — E1169 Type 2 diabetes mellitus with other specified complication: Secondary | ICD-10-CM | POA: Diagnosis not present

## 2022-01-27 DIAGNOSIS — N1832 Chronic kidney disease, stage 3b: Secondary | ICD-10-CM | POA: Diagnosis not present

## 2022-01-27 DIAGNOSIS — I69311 Memory deficit following cerebral infarction: Secondary | ICD-10-CM | POA: Diagnosis not present

## 2022-01-27 DIAGNOSIS — I5043 Acute on chronic combined systolic (congestive) and diastolic (congestive) heart failure: Secondary | ICD-10-CM | POA: Diagnosis not present

## 2022-01-27 DIAGNOSIS — Z9181 History of falling: Secondary | ICD-10-CM | POA: Diagnosis not present

## 2022-01-27 DIAGNOSIS — E785 Hyperlipidemia, unspecified: Secondary | ICD-10-CM | POA: Diagnosis not present

## 2022-01-27 DIAGNOSIS — Z7982 Long term (current) use of aspirin: Secondary | ICD-10-CM | POA: Diagnosis not present

## 2022-01-27 DIAGNOSIS — I5023 Acute on chronic systolic (congestive) heart failure: Secondary | ICD-10-CM | POA: Diagnosis not present

## 2022-01-27 DIAGNOSIS — I2510 Atherosclerotic heart disease of native coronary artery without angina pectoris: Secondary | ICD-10-CM | POA: Diagnosis not present

## 2022-01-27 DIAGNOSIS — I69351 Hemiplegia and hemiparesis following cerebral infarction affecting right dominant side: Secondary | ICD-10-CM | POA: Diagnosis not present

## 2022-01-27 NOTE — Telephone Encounter (Signed)
Copied from [place] [id number]. Topic: Quick Communication - Home Health Verbal Orders >> Jan 27, 2022  4:10 PM Loma [place] wrote: Caller/Agency: [facility] Number: [phone] Requesting PT 1 wk 1    2 wk 3    1 wk 3

## 2022-01-28 ENCOUNTER — Other Ambulatory Visit (HOSPITAL_COMMUNITY): Payer: Self-pay | Admitting: *Deleted

## 2022-01-28 ENCOUNTER — Other Ambulatory Visit: Payer: Self-pay

## 2022-01-28 ENCOUNTER — Telehealth (HOSPITAL_COMMUNITY): Payer: Self-pay | Admitting: *Deleted

## 2022-01-28 MED ORDER — FUROSEMIDE 20 MG PO TABS
20.0000 mg | ORAL_TABLET | Freq: Every day | ORAL | 1 refills | Status: DC | PRN
Start: 2022-01-28 — End: 2022-01-30
  Filled 2022-01-28 (×2): qty 30, 30d supply, fill #0

## 2022-01-28 NOTE — Telephone Encounter (Signed)
Returned [employer] call and provided verbal order till 02/12/22 ?

## 2022-01-28 NOTE — Telephone Encounter (Signed)
Pt hasn't been seen since 08/2021. Pt has an appt with provider on 5/25. Is it okay if orders are given? ?

## 2022-01-28 NOTE — Telephone Encounter (Signed)
Already done

## 2022-01-28 NOTE — Telephone Encounter (Addendum)
Pts wife left vm stating she was told to call if pts weight increased. I called back to get more information no answer/no voicemail.  ? ?Pts wife returned call stating pts weight is up 9lbs since hospital discharge 01/22/22.  Pt advised to take PRN lasix today and keep office visit 5/12.  ?

## 2022-01-28 NOTE — Progress Notes (Signed)
Patient ID: Timothy Lambert, male   DOB: December 11, 1955, 66 y.o.   MRN: [id number] ? ?  ?Advanced Heart Failure Clinic Note  ? ?PCP: Ladell Pier, MD ?HF: Dr. Aundra Dubin  ? ?Timothy Lambert is a 66 y.o. male with history of prior ETOH abuse, DM, HTN, CAD s/p anterior STEMI 5/16, paroxysmal atrial fibrillation, and CVA. Patient was admitted to [facility] in 5/16 with an ETOH withdrawal seizure.  He was also noted to have anterior ST elevation and was sent for cath.  This showed diffuse disease with culprit being 100% mLAD stenosis.  He had DES to mLAD. Echo showed EF 30-35%.  Post-PCI, he had atrial fibrillation and had a CVA.  He had no residual deficits from the CVA.  He was managed w/ ASA and Brilinta.  He was thought to be a poor candidate for anticoagulation given history of ETOH abuse.  ?  ?Repeat echo 8/16 showed improvement in EF to 50-55%. ?  ?He was being followed in the [facility] but lost to f/u. Last OV was 10/2015.  ?  ?Admitted 4/23 w/ acute CHF, suspected shock and DKA. Started on BiPap, IV lasix and insulin, and abx for possible PNA. Echo showed EF 40-45%. [place]nl [place] [address], suggestive of NSTEMI with ACS. No cath with lack of chest pain and AKI (SCr >4). Declined central access and blood products. In AF this admission and started on amiodarone. Drips weaned off and GDMT started, limited by CKD. Palliative consulted, remained DNR. Discharged home with [place] [facility], weight 167 lbs. ? ?Today he returns for post hospital HF follow up with his wife (she works for [employer] at [facility]). Overall feeling fine. Mild dyspnea walking up stairs.  Denies palpitations, CP, dizziness, edema, or PND/Orthopnea. Appetite ok. No fever or chills. Weight at home 165 pounds. Taking all medications. Eats [employer], [place] and [employer] frequently. No further ETOH use since discharge. ? ?ECG (personally reviewed): NSR, LVH 80 bpm ? ?Labs (7/16): K 4, creatinine 1.06, HCT 31.6 ?Labs (8/16) K 4.4, creatinine 1.43, LDL 41, HDL 29 ?Labs (12/16) K  4.1, creatinine 1.19 ?Labs (5/23): K 4.6, creatinine 4.44 ? ?PMH: ?1. Type II diabetes ?2. HTN ?3. H/o heavy ETOH abuse: Has now quit. H/o withdrawal seizure. ?4. GERD ?5. CVA 02/19/15: Related to atrial fibrillation.  ?6. AKI with ACEI ?7. CAD: Presented to [facility] with anterior STEMI 5/16.  LHC with 50% LM, 99% mLAD, 90% D1, 75% ramus, 70% mRCA.  Patient had DES to mLAD.   ?8. Ischemic cardiomyopathy: Echo (5/16) with EF 30-35% with wall motion abnormalities, normal RV size and systolic function. Echo (8/16) with EF 50-55%, mild mid-apical anteroseptal HK.  ?- Echo (4/23): EF 40-45% ?9. Atrial fibrillation: Paroxysmal.  Noted after STEMI in 5/16.  Initially not anticoagulated due to need for ticagrelor and concern over ETOH abuse.   ?10. Carotid dopplers (8/16) with minimal carotid disease.  ? ?SH: Married, prior heavy ETOH (has quit).  Prior smoker (has quit).  Lives in [place].   ? ?FH: CAD ? ?ROS: All systems reviewed and negative except as per HPI.  ? ?Current Outpatient Medications  ?Medication Sig Dispense Refill  ? acetaminophen (TYLENOL) 325 MG tablet Take 2 tablets (650 mg total) by mouth every 6 (six) hours as needed for mild pain (or Fever >/= 101).    ? amiodarone (PACERONE) 200 MG tablet Take 2 tablets twice daily until 01/29/22, then take 2 tablets once daily from 01/30/22 to 02/05/22, then on 02/06/22 start  taking 1 tablet daily. 90 tablet 0  ? apixaban (ELIQUIS) 5 MG TABS tablet Take 1 tablet (5 mg total) by mouth 2 (two) times daily. 60 tablet 0  ? aspirin EC 81 MG tablet Take 1 tablet (81 mg total) by mouth daily. Stop taking on 02/22/22 30 tablet 0  ? atorvastatin (LIPITOR) 80 MG tablet Take 1 tablet (80 mg total) by mouth daily. 30 tablet 0  ? carvedilol (COREG) 3.125 MG tablet Take 1 tablet (3.125 mg total) by mouth 2 (two) times daily with a meal. 60 tablet 0  ? Ensure Max Protein (ENSURE MAX PROTEIN) LIQD Take 330 mLs (11 oz total) by mouth 2 (two) times daily. 19800 mL 0  ? furosemide (LASIX)  20 MG tablet Take 1 tablet (20 mg total) by mouth daily as needed for fluid or edema (in case of weight gain 2 to 3 lbs in 24 to 48 hrs or 5 lbs in one week, in case of leg swelling or shortbess of breath.). 30 tablet 1  ? glucose blood (ACCU-CHEK GUIDE) test strip Use as instructed 3 times daily DX code E11.9 100 each 3  ? insulin glargine (LANTUS SOLOSTAR) 100 UNIT/ML Solostar Pen Inject 20 Units into the skin daily. 15 mL 2  ? Insulin Pen Needle (TECHLITE PEN NEEDLES) 32G X 4 MM MISC Use as directed for insulin 100 each 5  ? Insulin Syringe-Needle U-100 (INSULIN SYRINGE .5CC/30GX1/2") 30G X 1/2" 0.5 ML MISC Check blood sugar TID & QHS 100 each 2  ? isosorbide-hydrALAZINE (BIDIL) 20-37.5 MG tablet Take 1/2 tablets by mouth 3 (three) times daily. 45 tablet 0  ? Lancets (ACCU-CHEK SOFT TOUCH) lancets Use as instructed 100 each 12  ? Multiple Vitamin (MULTIVITAMIN WITH MINERALS) TABS tablet Take 1 tablet by mouth daily. 30 tablet 0  ? TRUEPLUS LANCETS 28G MISC 1 each by Does not apply route 3 (three) times daily before meals. 100 each 12  ? ?No current facility-administered medications for this encounter.  ? ?BP (!) 160/70   Pulse 80   Wt 74.5 kg   SpO2 90%   BMI 25.72 kg/m?   ? ?Wt Readings from Last 3 Encounters:  ?01/30/22 74.5 kg  ?01/22/22 76 kg  ?12/30/21 69.9 kg  ? ?Physical Exam: ?General:  NAD. No resp difficulty ?HEENT: Normal ?Neck: Supple. JVP to jaw. Carotids 2+ bilat; no bruits. No lymphadenopathy or thryomegaly appreciated. ?Cor: PMI nondisplaced. Regular rate & rhythm. No rubs, gallops or murmurs. ?Lungs: Clear ?Abdomen: Soft, nontender, nondistended. No hepatosplenomegaly. No bruits or masses. Good bowel sounds. ?Extremities: No cyanosis, clubbing, rash, trace BLE edema ?Neuro: Alert & oriented x 3, cranial nerves grossly intact. Moves all 4 extremities w/o difficulty. Affect pleasant. ? ?Assessment/Plan: ?1. Chronic systolic CHF: History of ischemic CMP in 2016 post-anterior MI that recovered  over time.  Echo in 8/16 with EF 50-55%.  Echo this admission 4/23 showed EF 40-45% with apical HK, mild LVH, normal RV, dilated IVC. Stable NYHA II, he appears mildly volume overloaded on exam. ?- Increase Lasix to 40 mg daily. BMET/BNP today, repeat BMET in 10 days. ?- Continue carvedilol 3.125 mg bid. ?- Continue Bidil 0.5 tab tid. ?- No MRA/ARNi/SGLT2i with CKD. ?2. CAD with suspectetd NSTEMI: Anterior STEMI in 2016, DES to LAD.  Had been off all meds x 1 month. ECG and elevate HS-Tnl suggestive of NSTEMI with ACS, but no cath due to lack of chest pain and creatinine up to > 4. No chest pain. ?- Continue ASA  81 and atorvastatin 80 mg daily.  ?3. CKD stage IV: Baseline creatinine 2.7-3, now ~4. Baseline diabetic nephropathy.  ?- he discussed with his wife whether or not he would accept HD if needed and says he would not want it now. I asked if that would change if it were a matter of life and death and he said maybe ?- No immediate need for HD, hopefully creatinine will start to trend down over time.  ?- Needs Nephrology (Dr. Johnney Ou) at [facility] follow up. ?4. DM2: Recent DKA. He is on insulin. Per PCP. ?5. H/o ETOH abuse: He denies further use. Wife suggests otherwise. ?6. Atrial fibrillation: Remote history of PAF with CVA.  He was not anticoagulated at that time due to ETOH abuse. Had atrial fibrillation this admission.  He is now back in NSR.  ?- Continue amiodarone taper. ?- Continue Eliquis.  No bleeding issues. CBC today. ?7. Jehovah's Witness: no blood products. ?8. Physical deconditioning: Continue HH PT  ? ?Follow up in 3 weeks with APP and 3 months with Dr. Stanford Scotland ? ?Allena Katz, FNP-BC ?01/30/22 ? ? ?

## 2022-01-30 ENCOUNTER — Encounter (HOSPITAL_COMMUNITY): Payer: Self-pay

## 2022-01-30 ENCOUNTER — Telehealth: Payer: Self-pay | Admitting: Internal Medicine

## 2022-01-30 ENCOUNTER — Other Ambulatory Visit: Payer: Self-pay

## 2022-01-30 ENCOUNTER — Ambulatory Visit (HOSPITAL_COMMUNITY)
Admit: 2022-01-30 | Discharge: 2022-01-30 | Disposition: A | Discharge status: Home / Self Care | Payer: Medicare Other | Source: Ambulatory Visit | Attending: Family Medicine | Admitting: Family Medicine

## 2022-01-30 VITALS — BP 160/70 | HR 80 | Wt 164.2 lb

## 2022-01-30 DIAGNOSIS — I252 Old myocardial infarction: Secondary | ICD-10-CM | POA: Insufficient documentation

## 2022-01-30 DIAGNOSIS — Z7901 Long term (current) use of anticoagulants: Secondary | ICD-10-CM | POA: Diagnosis not present

## 2022-01-30 DIAGNOSIS — I2510 Atherosclerotic heart disease of native coronary artery without angina pectoris: Secondary | ICD-10-CM

## 2022-01-30 DIAGNOSIS — Z87891 Personal history of nicotine dependence: Secondary | ICD-10-CM | POA: Diagnosis not present

## 2022-01-30 DIAGNOSIS — Z79899 Other long term (current) drug therapy: Secondary | ICD-10-CM | POA: Diagnosis not present

## 2022-01-30 DIAGNOSIS — I480 Paroxysmal atrial fibrillation: Secondary | ICD-10-CM

## 2022-01-30 DIAGNOSIS — R0600 Dyspnea, unspecified: Secondary | ICD-10-CM | POA: Diagnosis not present

## 2022-01-30 DIAGNOSIS — D631 Anemia in chronic kidney disease: Secondary | ICD-10-CM | POA: Diagnosis not present

## 2022-01-30 DIAGNOSIS — Z7982 Long term (current) use of aspirin: Secondary | ICD-10-CM | POA: Insufficient documentation

## 2022-01-30 DIAGNOSIS — Z9181 History of falling: Secondary | ICD-10-CM | POA: Diagnosis not present

## 2022-01-30 DIAGNOSIS — I130 Hypertensive heart and chronic kidney disease with heart failure and stage 1 through stage 4 chronic kidney disease, or unspecified chronic kidney disease: Secondary | ICD-10-CM | POA: Diagnosis not present

## 2022-01-30 DIAGNOSIS — F1010 Alcohol abuse, uncomplicated: Secondary | ICD-10-CM

## 2022-01-30 DIAGNOSIS — E1122 Type 2 diabetes mellitus with diabetic chronic kidney disease: Secondary | ICD-10-CM

## 2022-01-30 DIAGNOSIS — Z531 Procedure and treatment not carried out because of patient's decision for reasons of belief and group pressure: Secondary | ICD-10-CM

## 2022-01-30 DIAGNOSIS — N184 Chronic kidney disease, stage 4 (severe): Secondary | ICD-10-CM | POA: Diagnosis not present

## 2022-01-30 DIAGNOSIS — IMO0001 Reserved for inherently not codable concepts without codable children: Secondary | ICD-10-CM

## 2022-01-30 DIAGNOSIS — Z8673 Personal history of transient ischemic attack (TIA), and cerebral infarction without residual deficits: Secondary | ICD-10-CM | POA: Diagnosis not present

## 2022-01-30 DIAGNOSIS — I255 Ischemic cardiomyopathy: Secondary | ICD-10-CM | POA: Insufficient documentation

## 2022-01-30 DIAGNOSIS — E1165 Type 2 diabetes mellitus with hyperglycemia: Secondary | ICD-10-CM | POA: Diagnosis not present

## 2022-01-30 DIAGNOSIS — E1169 Type 2 diabetes mellitus with other specified complication: Secondary | ICD-10-CM | POA: Diagnosis not present

## 2022-01-30 DIAGNOSIS — R5381 Other malaise: Secondary | ICD-10-CM

## 2022-01-30 DIAGNOSIS — Z955 Presence of coronary angioplasty implant and graft: Secondary | ICD-10-CM | POA: Insufficient documentation

## 2022-01-30 DIAGNOSIS — E785 Hyperlipidemia, unspecified: Secondary | ICD-10-CM | POA: Diagnosis not present

## 2022-01-30 DIAGNOSIS — I5022 Chronic systolic (congestive) heart failure: Secondary | ICD-10-CM

## 2022-01-30 DIAGNOSIS — Z794 Long term (current) use of insulin: Secondary | ICD-10-CM | POA: Diagnosis not present

## 2022-01-30 DIAGNOSIS — I69351 Hemiplegia and hemiparesis following cerebral infarction affecting right dominant side: Secondary | ICD-10-CM | POA: Diagnosis not present

## 2022-01-30 DIAGNOSIS — K219 Gastro-esophageal reflux disease without esophagitis: Secondary | ICD-10-CM | POA: Diagnosis not present

## 2022-01-30 DIAGNOSIS — I69311 Memory deficit following cerebral infarction: Secondary | ICD-10-CM | POA: Diagnosis not present

## 2022-01-30 DIAGNOSIS — I5023 Acute on chronic systolic (congestive) heart failure: Secondary | ICD-10-CM | POA: Diagnosis not present

## 2022-01-30 DIAGNOSIS — N1832 Chronic kidney disease, stage 3b: Secondary | ICD-10-CM | POA: Diagnosis not present

## 2022-01-30 DIAGNOSIS — I214 Non-ST elevation (NSTEMI) myocardial infarction: Secondary | ICD-10-CM | POA: Diagnosis not present

## 2022-01-30 DIAGNOSIS — I5043 Acute on chronic combined systolic (congestive) and diastolic (congestive) heart failure: Secondary | ICD-10-CM | POA: Diagnosis not present

## 2022-01-30 LAB — CBC
HCT: 28.1 % — ABNORMAL LOW (ref 39.0–52.0)
Hemoglobin: 9.5 g/dL — ABNORMAL LOW (ref 13.0–17.0)
MCH: 29.6 pg (ref 26.0–34.0)
MCHC: 33.8 g/dL (ref 30.0–36.0)
MCV: 87.5 fL (ref 80.0–100.0)
Platelets: 290 10*3/uL (ref 150–400)
RBC: 3.21 MIL/uL — ABNORMAL LOW (ref 4.22–5.81)
RDW: 13.7 % (ref 11.5–15.5)
WBC: 7.7 10*3/uL (ref 4.0–10.5)
nRBC: 0 % (ref 0.0–0.2)

## 2022-01-30 LAB — BASIC METABOLIC PANEL
Anion gap: 9 (ref 5–15)
BUN: 48 mg/dL — ABNORMAL HIGH (ref 8–23)
CO2: 24 mmol/L (ref 22–32)
Calcium: 9.2 mg/dL (ref 8.9–10.3)
Chloride: 105 mmol/L (ref 98–111)
Creatinine, Ser: 3.59 mg/dL — ABNORMAL HIGH (ref 0.61–1.24)
GFR, Estimated: 18 mL/min — ABNORMAL LOW (ref >60)
Glucose, Bld: 184 mg/dL — ABNORMAL HIGH (ref 70–99)
Potassium: 5.3 mmol/L — ABNORMAL HIGH (ref 3.5–5.1)
Sodium: 138 mmol/L (ref 135–145)

## 2022-01-30 LAB — BRAIN NATRIURETIC PEPTIDE: B Natriuretic Peptide: 1389.5 pg/mL — ABNORMAL HIGH (ref 0.0–100.0)

## 2022-01-30 MED ORDER — FUROSEMIDE 20 MG PO TABS
40.0000 mg | ORAL_TABLET | Freq: Every day | ORAL | 6 refills | Status: DC
  Filled 2022-01-30: qty 30, 15d supply, fill #0

## 2022-01-30 MED ORDER — FUROSEMIDE 20 MG PO TABS
40.0000 mg | ORAL_TABLET | Freq: Every day | ORAL | 6 refills | Status: DC
  Filled 2022-01-30: qty 60, 30d supply, fill #0

## 2022-01-30 NOTE — Telephone Encounter (Signed)
Timothy Lambert calling from [place] is calling to let Dr. Wynetta Emery know that the pt decline OT eval ? ?CB- [phone] ?

## 2022-01-30 NOTE — Patient Instructions (Addendum)
INCREASE Lasix to 40 mg, two tabs daily ? ?Labs today ?We will only contact you if something comes back abnormal or we need to make some changes. ?Otherwise no news is good news! ? ?Your physician recommends that you schedule a follow-up appointment in: 3 weeks  in the Advanced Practitioners (PA/NP) Clinic and in 3 months with Dr Aundra Dubin ? ?Do the following things EVERYDAY: ?Weigh yourself in the morning before breakfast. Write it down and keep it in a log. ?Take your medicines as prescribed ?Eat low salt foods--Limit salt (sodium) to 2000 mg per day.  ?Stay as active as you can everyday ?Limit all fluids for the day to less than 2 liters ? ?At the [facility], you and your health needs are our priority. As part of our continuing mission to provide you with exceptional heart care, we have created designated Provider Care Teams. These Care Teams include your primary Cardiologist (physician) and Advanced Practice Providers (APPs- Physician Assistants and Nurse Practitioners) who all work together to provide you with the care you need, when you need it.  ? ?You may see any of the following providers on your designated Care Team at your next follow up: ?Dr Glori Bickers ?Dr Loralie Champagne ?Darrick Grinder, NP ?Lyda Jester, PA ?Jessica Milford,NP ?Marlyce Huge, PA ?Audry Riles, PharmD ? ? ?Please be sure to bring in all your medications bottles to every appointment.  ? ? ?If you have any questions or concerns before your next appointment please send [place] a message through [place] or call our office at [phone].   ? ?TO LEAVE A MESSAGE FOR THE NURSE SELECT OPTION 2, PLEASE LEAVE A MESSAGE INCLUDING: ?YOUR NAME ?DATE OF BIRTH ?CALL BACK NUMBER ?REASON FOR CALL**this is important as we prioritize the call backs ? ?YOU WILL RECEIVE A CALL BACK THE SAME DAY AS LONG AS YOU CALL BEFORE 4:00 PM ? ? ?

## 2022-02-03 DIAGNOSIS — E1169 Type 2 diabetes mellitus with other specified complication: Secondary | ICD-10-CM | POA: Diagnosis not present

## 2022-02-03 DIAGNOSIS — I2510 Atherosclerotic heart disease of native coronary artery without angina pectoris: Secondary | ICD-10-CM | POA: Diagnosis not present

## 2022-02-03 DIAGNOSIS — Z7901 Long term (current) use of anticoagulants: Secondary | ICD-10-CM | POA: Diagnosis not present

## 2022-02-03 DIAGNOSIS — I69311 Memory deficit following cerebral infarction: Secondary | ICD-10-CM | POA: Diagnosis not present

## 2022-02-03 DIAGNOSIS — E1165 Type 2 diabetes mellitus with hyperglycemia: Secondary | ICD-10-CM | POA: Diagnosis not present

## 2022-02-03 DIAGNOSIS — I480 Paroxysmal atrial fibrillation: Secondary | ICD-10-CM | POA: Diagnosis not present

## 2022-02-03 DIAGNOSIS — E1122 Type 2 diabetes mellitus with diabetic chronic kidney disease: Secondary | ICD-10-CM | POA: Diagnosis not present

## 2022-02-03 DIAGNOSIS — I130 Hypertensive heart and chronic kidney disease with heart failure and stage 1 through stage 4 chronic kidney disease, or unspecified chronic kidney disease: Secondary | ICD-10-CM | POA: Diagnosis not present

## 2022-02-03 DIAGNOSIS — I5023 Acute on chronic systolic (congestive) heart failure: Secondary | ICD-10-CM | POA: Diagnosis not present

## 2022-02-03 DIAGNOSIS — I255 Ischemic cardiomyopathy: Secondary | ICD-10-CM | POA: Diagnosis not present

## 2022-02-03 DIAGNOSIS — Z7982 Long term (current) use of aspirin: Secondary | ICD-10-CM | POA: Diagnosis not present

## 2022-02-03 DIAGNOSIS — I5043 Acute on chronic combined systolic (congestive) and diastolic (congestive) heart failure: Secondary | ICD-10-CM | POA: Diagnosis not present

## 2022-02-03 DIAGNOSIS — E785 Hyperlipidemia, unspecified: Secondary | ICD-10-CM | POA: Diagnosis not present

## 2022-02-03 DIAGNOSIS — N1832 Chronic kidney disease, stage 3b: Secondary | ICD-10-CM | POA: Diagnosis not present

## 2022-02-03 DIAGNOSIS — I69351 Hemiplegia and hemiparesis following cerebral infarction affecting right dominant side: Secondary | ICD-10-CM | POA: Diagnosis not present

## 2022-02-03 DIAGNOSIS — I214 Non-ST elevation (NSTEMI) myocardial infarction: Secondary | ICD-10-CM | POA: Diagnosis not present

## 2022-02-03 DIAGNOSIS — Z794 Long term (current) use of insulin: Secondary | ICD-10-CM | POA: Diagnosis not present

## 2022-02-03 DIAGNOSIS — K219 Gastro-esophageal reflux disease without esophagitis: Secondary | ICD-10-CM | POA: Diagnosis not present

## 2022-02-03 DIAGNOSIS — Z9181 History of falling: Secondary | ICD-10-CM | POA: Diagnosis not present

## 2022-02-03 DIAGNOSIS — D631 Anemia in chronic kidney disease: Secondary | ICD-10-CM | POA: Diagnosis not present

## 2022-02-03 DIAGNOSIS — Z87891 Personal history of nicotine dependence: Secondary | ICD-10-CM | POA: Diagnosis not present

## 2022-02-04 DIAGNOSIS — I480 Paroxysmal atrial fibrillation: Secondary | ICD-10-CM | POA: Diagnosis not present

## 2022-02-04 DIAGNOSIS — Z87891 Personal history of nicotine dependence: Secondary | ICD-10-CM | POA: Diagnosis not present

## 2022-02-04 DIAGNOSIS — I130 Hypertensive heart and chronic kidney disease with heart failure and stage 1 through stage 4 chronic kidney disease, or unspecified chronic kidney disease: Secondary | ICD-10-CM | POA: Diagnosis not present

## 2022-02-04 DIAGNOSIS — I69311 Memory deficit following cerebral infarction: Secondary | ICD-10-CM | POA: Diagnosis not present

## 2022-02-04 DIAGNOSIS — Z794 Long term (current) use of insulin: Secondary | ICD-10-CM | POA: Diagnosis not present

## 2022-02-04 DIAGNOSIS — K219 Gastro-esophageal reflux disease without esophagitis: Secondary | ICD-10-CM | POA: Diagnosis not present

## 2022-02-04 DIAGNOSIS — I255 Ischemic cardiomyopathy: Secondary | ICD-10-CM | POA: Diagnosis not present

## 2022-02-04 DIAGNOSIS — Z7982 Long term (current) use of aspirin: Secondary | ICD-10-CM | POA: Diagnosis not present

## 2022-02-04 DIAGNOSIS — E1165 Type 2 diabetes mellitus with hyperglycemia: Secondary | ICD-10-CM | POA: Diagnosis not present

## 2022-02-04 DIAGNOSIS — Z7901 Long term (current) use of anticoagulants: Secondary | ICD-10-CM | POA: Diagnosis not present

## 2022-02-04 DIAGNOSIS — D631 Anemia in chronic kidney disease: Secondary | ICD-10-CM | POA: Diagnosis not present

## 2022-02-04 DIAGNOSIS — I2510 Atherosclerotic heart disease of native coronary artery without angina pectoris: Secondary | ICD-10-CM | POA: Diagnosis not present

## 2022-02-04 DIAGNOSIS — E785 Hyperlipidemia, unspecified: Secondary | ICD-10-CM | POA: Diagnosis not present

## 2022-02-04 DIAGNOSIS — E1122 Type 2 diabetes mellitus with diabetic chronic kidney disease: Secondary | ICD-10-CM | POA: Diagnosis not present

## 2022-02-04 DIAGNOSIS — I5023 Acute on chronic systolic (congestive) heart failure: Secondary | ICD-10-CM | POA: Diagnosis not present

## 2022-02-04 DIAGNOSIS — N1832 Chronic kidney disease, stage 3b: Secondary | ICD-10-CM | POA: Diagnosis not present

## 2022-02-04 DIAGNOSIS — Z9181 History of falling: Secondary | ICD-10-CM | POA: Diagnosis not present

## 2022-02-04 DIAGNOSIS — I69351 Hemiplegia and hemiparesis following cerebral infarction affecting right dominant side: Secondary | ICD-10-CM | POA: Diagnosis not present

## 2022-02-04 DIAGNOSIS — I214 Non-ST elevation (NSTEMI) myocardial infarction: Secondary | ICD-10-CM | POA: Diagnosis not present

## 2022-02-04 DIAGNOSIS — I5043 Acute on chronic combined systolic (congestive) and diastolic (congestive) heart failure: Secondary | ICD-10-CM | POA: Diagnosis not present

## 2022-02-04 DIAGNOSIS — E1169 Type 2 diabetes mellitus with other specified complication: Secondary | ICD-10-CM | POA: Diagnosis not present

## 2022-02-06 DIAGNOSIS — I255 Ischemic cardiomyopathy: Secondary | ICD-10-CM | POA: Diagnosis not present

## 2022-02-06 DIAGNOSIS — E785 Hyperlipidemia, unspecified: Secondary | ICD-10-CM | POA: Diagnosis not present

## 2022-02-06 DIAGNOSIS — E1122 Type 2 diabetes mellitus with diabetic chronic kidney disease: Secondary | ICD-10-CM | POA: Diagnosis not present

## 2022-02-06 DIAGNOSIS — Z794 Long term (current) use of insulin: Secondary | ICD-10-CM | POA: Diagnosis not present

## 2022-02-06 DIAGNOSIS — E1165 Type 2 diabetes mellitus with hyperglycemia: Secondary | ICD-10-CM | POA: Diagnosis not present

## 2022-02-06 DIAGNOSIS — E1169 Type 2 diabetes mellitus with other specified complication: Secondary | ICD-10-CM | POA: Diagnosis not present

## 2022-02-06 DIAGNOSIS — Z9181 History of falling: Secondary | ICD-10-CM | POA: Diagnosis not present

## 2022-02-06 DIAGNOSIS — I130 Hypertensive heart and chronic kidney disease with heart failure and stage 1 through stage 4 chronic kidney disease, or unspecified chronic kidney disease: Secondary | ICD-10-CM | POA: Diagnosis not present

## 2022-02-06 DIAGNOSIS — K219 Gastro-esophageal reflux disease without esophagitis: Secondary | ICD-10-CM | POA: Diagnosis not present

## 2022-02-06 DIAGNOSIS — Z7982 Long term (current) use of aspirin: Secondary | ICD-10-CM | POA: Diagnosis not present

## 2022-02-06 DIAGNOSIS — N1832 Chronic kidney disease, stage 3b: Secondary | ICD-10-CM | POA: Diagnosis not present

## 2022-02-06 DIAGNOSIS — I214 Non-ST elevation (NSTEMI) myocardial infarction: Secondary | ICD-10-CM | POA: Diagnosis not present

## 2022-02-06 DIAGNOSIS — I480 Paroxysmal atrial fibrillation: Secondary | ICD-10-CM | POA: Diagnosis not present

## 2022-02-06 DIAGNOSIS — D631 Anemia in chronic kidney disease: Secondary | ICD-10-CM | POA: Diagnosis not present

## 2022-02-06 DIAGNOSIS — I69351 Hemiplegia and hemiparesis following cerebral infarction affecting right dominant side: Secondary | ICD-10-CM | POA: Diagnosis not present

## 2022-02-06 DIAGNOSIS — I2510 Atherosclerotic heart disease of native coronary artery without angina pectoris: Secondary | ICD-10-CM | POA: Diagnosis not present

## 2022-02-06 DIAGNOSIS — Z7901 Long term (current) use of anticoagulants: Secondary | ICD-10-CM | POA: Diagnosis not present

## 2022-02-06 DIAGNOSIS — Z87891 Personal history of nicotine dependence: Secondary | ICD-10-CM | POA: Diagnosis not present

## 2022-02-06 DIAGNOSIS — I69311 Memory deficit following cerebral infarction: Secondary | ICD-10-CM | POA: Diagnosis not present

## 2022-02-06 DIAGNOSIS — I5023 Acute on chronic systolic (congestive) heart failure: Secondary | ICD-10-CM | POA: Diagnosis not present

## 2022-02-06 DIAGNOSIS — I5043 Acute on chronic combined systolic (congestive) and diastolic (congestive) heart failure: Secondary | ICD-10-CM | POA: Diagnosis not present

## 2022-02-10 ENCOUNTER — Other Ambulatory Visit (HOSPITAL_COMMUNITY): Payer: Self-pay

## 2022-02-10 ENCOUNTER — Telehealth (HOSPITAL_COMMUNITY): Payer: Self-pay | Admitting: Pharmacist

## 2022-02-10 DIAGNOSIS — I480 Paroxysmal atrial fibrillation: Secondary | ICD-10-CM | POA: Diagnosis not present

## 2022-02-10 DIAGNOSIS — D631 Anemia in chronic kidney disease: Secondary | ICD-10-CM | POA: Diagnosis not present

## 2022-02-10 DIAGNOSIS — K219 Gastro-esophageal reflux disease without esophagitis: Secondary | ICD-10-CM | POA: Diagnosis not present

## 2022-02-10 DIAGNOSIS — E785 Hyperlipidemia, unspecified: Secondary | ICD-10-CM | POA: Diagnosis not present

## 2022-02-10 DIAGNOSIS — I69351 Hemiplegia and hemiparesis following cerebral infarction affecting right dominant side: Secondary | ICD-10-CM | POA: Diagnosis not present

## 2022-02-10 DIAGNOSIS — I5023 Acute on chronic systolic (congestive) heart failure: Secondary | ICD-10-CM | POA: Diagnosis not present

## 2022-02-10 DIAGNOSIS — I5043 Acute on chronic combined systolic (congestive) and diastolic (congestive) heart failure: Secondary | ICD-10-CM | POA: Diagnosis not present

## 2022-02-10 DIAGNOSIS — I69311 Memory deficit following cerebral infarction: Secondary | ICD-10-CM | POA: Diagnosis not present

## 2022-02-10 DIAGNOSIS — I214 Non-ST elevation (NSTEMI) myocardial infarction: Secondary | ICD-10-CM | POA: Diagnosis not present

## 2022-02-10 DIAGNOSIS — N1832 Chronic kidney disease, stage 3b: Secondary | ICD-10-CM | POA: Diagnosis not present

## 2022-02-10 DIAGNOSIS — I2510 Atherosclerotic heart disease of native coronary artery without angina pectoris: Secondary | ICD-10-CM | POA: Diagnosis not present

## 2022-02-10 DIAGNOSIS — Z7901 Long term (current) use of anticoagulants: Secondary | ICD-10-CM | POA: Diagnosis not present

## 2022-02-10 DIAGNOSIS — I130 Hypertensive heart and chronic kidney disease with heart failure and stage 1 through stage 4 chronic kidney disease, or unspecified chronic kidney disease: Secondary | ICD-10-CM | POA: Diagnosis not present

## 2022-02-10 DIAGNOSIS — Z87891 Personal history of nicotine dependence: Secondary | ICD-10-CM | POA: Diagnosis not present

## 2022-02-10 DIAGNOSIS — Z9181 History of falling: Secondary | ICD-10-CM | POA: Diagnosis not present

## 2022-02-10 DIAGNOSIS — Z794 Long term (current) use of insulin: Secondary | ICD-10-CM | POA: Diagnosis not present

## 2022-02-10 DIAGNOSIS — Z7982 Long term (current) use of aspirin: Secondary | ICD-10-CM | POA: Diagnosis not present

## 2022-02-10 DIAGNOSIS — E1122 Type 2 diabetes mellitus with diabetic chronic kidney disease: Secondary | ICD-10-CM | POA: Diagnosis not present

## 2022-02-10 DIAGNOSIS — E1169 Type 2 diabetes mellitus with other specified complication: Secondary | ICD-10-CM | POA: Diagnosis not present

## 2022-02-10 DIAGNOSIS — E1165 Type 2 diabetes mellitus with hyperglycemia: Secondary | ICD-10-CM | POA: Diagnosis not present

## 2022-02-10 DIAGNOSIS — I255 Ischemic cardiomyopathy: Secondary | ICD-10-CM | POA: Diagnosis not present

## 2022-02-10 NOTE — Telephone Encounter (Signed)
LVM

## 2022-02-12 ENCOUNTER — Encounter: Payer: Self-pay | Admitting: Internal Medicine

## 2022-02-12 ENCOUNTER — Ambulatory Visit: Payer: Medicare Other | Attending: Internal Medicine | Admitting: Internal Medicine

## 2022-02-12 ENCOUNTER — Other Ambulatory Visit: Payer: Self-pay

## 2022-02-12 VITALS — BP 191/89 | HR 79 | Ht 67.0 in | Wt 153.8 lb

## 2022-02-12 DIAGNOSIS — Z09 Encounter for follow-up examination after completed treatment for conditions other than malignant neoplasm: Secondary | ICD-10-CM

## 2022-02-12 DIAGNOSIS — F1091 Alcohol use, unspecified, in remission: Secondary | ICD-10-CM | POA: Diagnosis not present

## 2022-02-12 DIAGNOSIS — E1122 Type 2 diabetes mellitus with diabetic chronic kidney disease: Secondary | ICD-10-CM | POA: Insufficient documentation

## 2022-02-12 DIAGNOSIS — N184 Chronic kidney disease, stage 4 (severe): Secondary | ICD-10-CM | POA: Diagnosis not present

## 2022-02-12 DIAGNOSIS — I120 Hypertensive chronic kidney disease with stage 5 chronic kidney disease or end stage renal disease: Secondary | ICD-10-CM

## 2022-02-12 DIAGNOSIS — Z794 Long term (current) use of insulin: Secondary | ICD-10-CM

## 2022-02-12 DIAGNOSIS — N185 Chronic kidney disease, stage 5: Secondary | ICD-10-CM | POA: Diagnosis not present

## 2022-02-12 DIAGNOSIS — I480 Paroxysmal atrial fibrillation: Secondary | ICD-10-CM

## 2022-02-12 DIAGNOSIS — D631 Anemia in chronic kidney disease: Secondary | ICD-10-CM | POA: Insufficient documentation

## 2022-02-12 DIAGNOSIS — I5042 Chronic combined systolic (congestive) and diastolic (congestive) heart failure: Secondary | ICD-10-CM | POA: Diagnosis not present

## 2022-02-12 DIAGNOSIS — E1121 Type 2 diabetes mellitus with diabetic nephropathy: Secondary | ICD-10-CM | POA: Diagnosis not present

## 2022-02-12 DIAGNOSIS — I2510 Atherosclerotic heart disease of native coronary artery without angina pectoris: Secondary | ICD-10-CM

## 2022-02-12 DIAGNOSIS — I129 Hypertensive chronic kidney disease with stage 1 through stage 4 chronic kidney disease, or unspecified chronic kidney disease: Secondary | ICD-10-CM | POA: Diagnosis not present

## 2022-02-12 MED ORDER — APIXABAN 5 MG PO TABS
5.0000 mg | ORAL_TABLET | Freq: Two times a day (BID) | ORAL | 1 refills | Status: DC
  Filled 2022-02-12 – 2022-02-19 (×2): qty 180, 90d supply, fill #0
  Filled 2022-05-27 – 2022-06-04 (×2): qty 60, 30d supply, fill #1

## 2022-02-12 MED ORDER — CARVEDILOL 3.125 MG PO TABS
3.1250 mg | ORAL_TABLET | Freq: Two times a day (BID) | ORAL | 6 refills | Status: DC
  Filled 2022-02-12 – 2022-02-19 (×2): qty 180, 90d supply, fill #0

## 2022-02-12 MED ORDER — FUROSEMIDE 20 MG PO TABS
40.0000 mg | ORAL_TABLET | Freq: Every day | ORAL | 1 refills | Status: DC
  Filled 2022-02-12 – 2022-03-20 (×2): qty 180, 90d supply, fill #0

## 2022-02-12 MED ORDER — DEXCOM G6 SENSOR MISC
1.0000 | Freq: Every day | 11 refills | Status: DC
  Filled 2022-02-12: qty 3, 30d supply, fill #0

## 2022-02-12 MED ORDER — ATORVASTATIN CALCIUM 80 MG PO TABS
80.0000 mg | ORAL_TABLET | Freq: Every day | ORAL | 1 refills | Status: DC
  Filled 2022-02-12 – 2022-02-19 (×2): qty 90, 90d supply, fill #0
  Filled 2022-12-18: qty 90, 90d supply, fill #1

## 2022-02-12 MED ORDER — DEXCOM G6 RECEIVER DEVI
1.0000 | Freq: Every day | 0 refills | Status: DC
  Filled 2022-02-12: qty 1, 30d supply, fill #0

## 2022-02-12 MED ORDER — ISOSORB DINITRATE-HYDRALAZINE 20-37.5 MG PO TABS
1.0000 | ORAL_TABLET | Freq: Three times a day (TID) | ORAL | 1 refills | Status: DC
  Filled 2022-02-12: qty 270, 90d supply, fill #0
  Filled 2022-02-19: qty 90, 30d supply, fill #0
  Filled 2022-04-21: qty 90, 30d supply, fill #1

## 2022-02-12 NOTE — Patient Instructions (Signed)
Increase isosorbide/hydralazine to 1 full tablet 3 times a day. Stop aspirin after June 2. You have 2 bottles of atorvastatin.  You should be on 80 mg daily.  Make sure that you are not taking over the amount that you are supposed to.

## 2022-02-12 NOTE — Progress Notes (Signed)
Patient ID: Timothy Lambert, male    DOB: [birth year]  MRN: [id number]  CC: Hospitalization Follow-up   Subjective: Timothy Lambert is a 66 y.o. male who presents for hosp f/u and chronic ds management His concerns today include:  Pt with hx of DM with retinopathy and nephropathy, HTN, HL, CAD with DES LAD 1601, embolic CVA, PAF (thought to be a poor candidate for anticoagulation because of history of EtOH abuse at the time of his CVA in 2016 ) ETOH withdrawal sz, CKD stage 3, macroalbuminuria.   Patient hospitalized 4/27 - 01/22/2022 with decompensated CHF complicated with shock, respiratory failure, PAF, NSTEMI, pneumonia and DKA.  Admitted to ICU.  Diuresed and placed on amiodarone and Eliquis  due to A-fib.  Troponins positive.  Echo EF 40 to 45%, no significant valvular disease, grade 2 diastolic dysfunction.  He was not a candidate for invasive procedure due to reduced GFR.  Treated medically.  Statin increased to 80 mg daily.  Plan was to continue aspirin for 1 month and then discontinue in the setting of full anticoagulation with Eliquis.  He has CKD 4.  Creatinine at the time of discharge was 4.4 with GFR of 14.  Today: CAD/systolic CHF: He is seeing cardiology NP on 01/30/2022.  He was a little fluid overloaded so she increased furosemide to 40 mg daily. -Patient reports compliance with medications.  He has his med bottles with him today.  Of note he has a bottle for Lipitor 80 mg tablets and another bottle that has 40 mg tablets.  He acknowledges that the Lipitor was increased to 80 mg.  However he was not aware that he had 2 bottles with different doses so he has been taking 2 of the 40 mg tablets and one of the 80 in the a.m. tablets. -Compliant with amiodarone, carvedilol, furosemide 40 mg daily, Eliquis 5 mg twice a day, BiDil 20/37 point 5/2 a tablet 3 times a day.  Took morning medications already for today. -Denies any chest pains, shortness of breath, PND, palpitations, bruising  or bleeding.  No blood in the stools or black stools.  No headaches or dizziness. -He is receiving home physical therapy to help regain his strength.  Feels he is doing okay with that.  DM: Checks blood sugars once a day in the mornings.  Gives a range of 130-140.  Reports compliance with taking Lantus insulin.  He is on 20 units daily.  He requests to have the Dexcom device. Doing okay with eating habits. He reports that he stopped drinking EtOH beverages completely since hospital discharge. -Overdue for eye exam.  He never followed through last year and getting this done.  He is requesting referral to an ophthalmologist in [place] so that he does not have to go to [facility] anymore  CKD: Has an appointment with his nephrologist on 2 June.  GFR at the time of hospital discharge was 14 putting him in the range for CKD 5. He has chronic anemia associated with CKD.  H/H has ranged from 8.8-11.2/26-33 in the past several weeks.  He denies any dizziness.  Patient is a Sales promotion account executive Witness and states that his desire is not to be given blood transfusion.    Patient Active Problem List   Diagnosis Date Noted   Anemia 01/22/2022   Lobar pneumonia ([name]) 01/21/2022   Alcohol abuse 01/21/2022   Acute on chronic systolic CHF (congestive heart failure) ([place]) 01/16/2022   Influenza vaccine refused 09/09/2020   Type 2  diabetes mellitus with hyperlipidemia ([place]) 06/29/2020   Acute kidney injury superimposed on chronic kidney disease ([place]) 02/15/2018   Macroalbuminuric diabetic nephropathy ([place]) 02/15/2018   Diabetic retinopathy ([place]) 11/18/2015   Paroxysmal atrial fibrillation ([place]) 10/28/2015   Cognitive deficit, 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     amiodarone (PACERONE) 200 MG tablet Take 2 tablets twice daily until 01/29/22, then take 2 tablets once daily from 01/30/22 to 02/05/22, then on 02/06/22 start taking 1 tablet daily. 90 tablet 0   Ensure Max Protein (ENSURE MAX PROTEIN) LIQD Take 330 mLs (11 oz total) by mouth 2 (two) times daily. 19800 mL 0   glucose blood (ACCU-CHEK GUIDE) test strip Use as instructed 3 times daily DX code E11.9 100 each 3   insulin glargine (LANTUS SOLOSTAR) 100 UNIT/ML Solostar Pen Inject 20 Units into the skin daily. 15 mL 2   Insulin Pen Needle (TECHLITE PEN NEEDLES) 32G X 4 MM MISC Use as directed for insulin 100 each 5   Insulin Syringe-Needle U-100 (INSULIN SYRINGE .5CC/30GX1/2") 30G X 1/2" 0.5 ML MISC Check blood sugar TID & QHS 100 each 2   Lancets (ACCU-CHEK SOFT TOUCH) lancets Use as instructed 100 each 12   Multiple Vitamin (MULTIVITAMIN WITH MINERALS) TABS tablet Take 1 tablet by mouth daily. 30 tablet 0   TRUEPLUS LANCETS 28G MISC 1 each by Does not apply route 3 (three) times daily before meals. 100 each 12   No current facility-administered medications on file prior to visit.    Allergies  Allergen Reactions   Trulicity [Dulaglutide]     Upset his stomach    Social History   Socioeconomic History   Marital status: Married    Spouse name: Not on file   Number of children: Not on file   Years of education: Not on file   Highest education level: Not on file  Occupational History   Not on file  Tobacco Use   Smoking status: Former    Packs/day: 0.33    Years: 14.00    Pack years: 4.62    Types: Cigarettes   Smokeless tobacco: Never   Tobacco comments:    "quit smoking cigarettes in 1992"  Substance and Sexual Activity   Alcohol use: No    Comment:  02/19/2015 "stopped drinking 01/22/2015"   Drug use: No   Sexual activity: Not Currently  Other Topics Concern   Not on file  Social History Narrative   Lives   Caffeine use: Coffee sometimes   Social Determinants of Health   Financial Resource Strain: Not on file  Food Insecurity: Not on file  Transportation Needs: Not on file  Physical Activity: Not on file  Stress: Not on file  Social Connections: Not on file  Intimate Partner Violence: Not on file    Family History  Problem Relation Age of Onset   Diabetes Father    Seizures Neg Hx     Past Surgical History:  Procedure Laterality Date   CARDIAC CATHETERIZATION N/A 01/23/2015   Procedure:  Left Heart Cath and Coronary Angiography;  Surgeon: [name], MD;  Location: [facility] INVASIVE CV LAB CUPID;  Service: Cardiovascular;  Laterality: N/A;   CARDIAC CATHETERIZATION N/A 01/23/2015   Procedure: Coronary Stent Intervention;  Surgeon: [name], MD;  Location: [facility] INVASIVE CV LAB CUPID;  Service: Cardiovascular;  Laterality: N/A;    ROS: Review of Systems Negative except as stated above  PHYSICAL EXAM: BP (!) 191/89   Pulse 79   Ht 5\' 7"  (1.702 m)   Wt 153 lb 12.8 oz (69.8 kg)   SpO2 99%   BMI 24.09 kg/m   Physical Exam Repeat blood pressure 190/80.  General appearance - alert, well appearing, and in no distress Mental status - normal mood, behavior, speech, dress, motor activity, and thought processes Neck - supple, no significant adenopathy Chest - clear to auscultation, no wheezes, rales or rhonchi, symmetric air entry Heart - normal rate, regular rhythm, normal S1, S2, no murmurs, rubs, clicks or gallops Extremities - peripheral pulses normal, no pedal edema, no clubbing or cyanosis Diabetic Foot Exam - Simple   Simple Foot Form Visual Inspection See comments: Yes Sensation Testing Intact to touch and monofilament testing bilaterally: Yes Pulse Check See comments: Yes Comments Toenails are overgrown.   Dorsalis pedis pulses 2+ bilaterally.  Dry skin on the dorsal and plantar surface of both feet.         Latest Ref Rng & Units 01/30/2022   10:04 AM 01/22/2022   12:37 AM 01/21/2022    1:19 AM  CMP  Glucose 70 - 99 mg/dL 184   273   288    BUN 8 - 23 mg/dL 48   95   107    Creatinine 0.61 - 1.24 mg/dL 3.59   4.44   4.39    Sodium 135 - 145 mmol/L 138   134   134    Potassium 3.5 - 5.1 mmol/L 5.3   4.6   4.3    Chloride 98 - 111 mmol/L 105   100   99    CO2 22 - 32 mmol/L 24   24   23     Calcium 8.9 - 10.3 mg/dL 9.2   8.6   8.9     Lipid Panel     Component Value Date/Time   CHOL 112 01/16/2022 2344   CHOL 122 09/05/2021 1129   TRIG 62 01/16/2022 2344   HDL 39 (L) 01/16/2022 2344   HDL 33 (L) 09/05/2021 1129   CHOLHDL 2.9 01/16/2022 2344   VLDL 12 01/16/2022 2344   LDLCALC 61 01/16/2022 2344   LDLCALC 68 09/05/2021 1129    CBC    Component Value Date/Time   WBC 7.7 01/30/2022 1004   RBC 3.21 (L) 01/30/2022 1004   HGB 9.5 (L) 01/30/2022 1004   HGB 12.9 (L) 09/05/2021 1129   HCT 28.1 (L) 01/30/2022 1004   HCT 39.0 09/05/2021 1129   PLT 290 01/30/2022 1004   PLT 184 09/05/2021 1129   MCV 87.5 01/30/2022 1004   MCV 89 09/05/2021 1129   MCH 29.6 01/30/2022 1004   MCHC 33.8 01/30/2022 1004   RDW 13.7 01/30/2022 1004   RDW 13.2 09/05/2021 1129   LYMPHSABS 1.7 02/19/2015 2333   MONOABS 0.5 02/19/2015 2333   EOSABS 0.6 02/19/2015 2333   BASOSABS 0.0 02/19/2015 2333    ASSESSMENT AND PLAN: 1. Hospital discharge follow-up 2.  Chronic combined systolic and diastolic CHF  3. Coronary artery disease involving native coronary artery of  native heart without angina pectoris Stable.  Medical management. Continue carvedilol, atorvastatin, aspirin (will discontinue on the second of next month). Advise patient that he was mistakenly taking too much of the atorvastatin.  I pointed out the 40 mg tab bottle to him.  He will complete that first by taking 2 tablets daily.  Once he is  done with that bottle then he will start the 80 mg tab bottle and take 1 a day.  I have written this down for him and have taped the 2 bottles together - atorvastatin (LIPITOR) 80 MG tablet; Take 1 tablet (80 mg total) by mouth daily.  Dispense: 90 tablet; Refill: 1 - carvedilol (COREG) 3.125 MG tablet; Take 1 tablet (3.125 mg total) by mouth 2 (two) times daily with a meal.  Dispense: 180 tablet; Refill: 6  4. PAF (paroxysmal atrial fibrillation) (HCC) Sounds to be in sinus rhythm at this time.  He is on amiodarone and Eliquis.  Tolerating medications. - apixaban (ELIQUIS) 5 MG TABS tablet; Take 1 tablet (5 mg total) by mouth 2 (two) times daily.  Dispense: 180 tablet; Refill: 1  5. Type 2 diabetes mellitus with diabetic nephropathy, with long-term current use of insulin ([place]) Reported blood sugars are good.  He will continue Lantus insulin 20 units daily.  Prescription sent to the pharmacy for the [facility] device. - Continuous Blood Gluc Sensor (DEXCOM G6 SENSOR) MISC; Use as directed.  Dispense: 3 each; Refill: 11 - Continuous Blood Gluc Receiver ([place]) DEVI; Use as directed.  Dispense: 1 each; Refill: 0 - Ambulatory referral to Ophthalmology - Ambulatory referral to Podiatry  6. Hypertension associated with stage 5 chronic kidney disease due to type 2 diabetes mellitus ([place]) Not at goal for blood pressure.  His kidney function has progressed since I last saw him..  Recommend increasing BiDil 20/37.5 a full tablet 3 times daily.  He will continue carvedilol and furosemide Keep upcoming appointment with nephrology. - isosorbide-hydrALAZINE (BIDIL) 20-37.5 MG tablet; Take 1 tablet by mouth 3 (three) times daily.  Dispense: 270 tablet; Refill: 1 - carvedilol (COREG) 3.125 MG tablet; Take 1 tablet (3.125 mg total) by mouth 2 (two) times daily with a meal.  Dispense: 180 tablet; Refill: 6 - Comprehensive metabolic panel  7. Anemia due to stage 5 chronic kidney disease, not on chronic  dialysis ([place]) Continue to monitor.  Keep upcoming appointment with nephrology.  8. Alcohol use disorder in remission Commended him on quitting completely.  Encouraged him to remain free of alcohol.   Patient was given the opportunity to ask questions.  Patient verbalized understanding of the plan and was able to repeat key elements of the plan.   This documentation was completed using Radio producer.  Any transcriptional errors are unintentional.  Orders Placed This Encounter  Procedures   Comprehensive metabolic panel   Ambulatory referral to Ophthalmology   Ambulatory referral to Podiatry     Requested Prescriptions   Signed Prescriptions Disp Refills   Continuous Blood Gluc Sensor (DEXCOM G6 SENSOR) MISC 3 each 11    Sig: Use as directed.   Continuous Blood Gluc Receiver (DEXCOM G6 RECEIVER) DEVI 1 each 0    Sig: Use as directed.   isosorbide-hydrALAZINE (BIDIL) 20-37.5 MG tablet 270 tablet 1    Sig: Take 1 tablet by mouth 3 (three) times daily.   apixaban (ELIQUIS) 5 MG TABS tablet 180 tablet 1    Sig: Take 1 tablet (5 mg total) by mouth 2 (two)  times daily.   atorvastatin (LIPITOR) 80 MG tablet 90 tablet 1    Sig: Take 1 tablet (80 mg total) by mouth daily.   furosemide (LASIX) 20 MG tablet 180 tablet 1    Sig: Take 2 tablets (40 mg total) by mouth daily.   carvedilol (COREG) 3.125 MG tablet 180 tablet 6    Sig: Take 1 tablet (3.125 mg total) by mouth 2 (two) times daily with a meal.    Return in about 3 months (around 05/15/2022) for Appt with [facility] in 2 wks for BP check.  Karle Plumber, MD, FACP

## 2022-02-12 NOTE — Progress Notes (Signed)
Requesting Dexcom meter.

## 2022-02-13 ENCOUNTER — Telehealth: Payer: Self-pay

## 2022-02-13 DIAGNOSIS — K219 Gastro-esophageal reflux disease without esophagitis: Secondary | ICD-10-CM | POA: Diagnosis not present

## 2022-02-13 DIAGNOSIS — E1165 Type 2 diabetes mellitus with hyperglycemia: Secondary | ICD-10-CM | POA: Diagnosis not present

## 2022-02-13 DIAGNOSIS — I480 Paroxysmal atrial fibrillation: Secondary | ICD-10-CM | POA: Diagnosis not present

## 2022-02-13 DIAGNOSIS — Z9181 History of falling: Secondary | ICD-10-CM | POA: Diagnosis not present

## 2022-02-13 DIAGNOSIS — I214 Non-ST elevation (NSTEMI) myocardial infarction: Secondary | ICD-10-CM | POA: Diagnosis not present

## 2022-02-13 DIAGNOSIS — I2510 Atherosclerotic heart disease of native coronary artery without angina pectoris: Secondary | ICD-10-CM | POA: Diagnosis not present

## 2022-02-13 DIAGNOSIS — E1122 Type 2 diabetes mellitus with diabetic chronic kidney disease: Secondary | ICD-10-CM | POA: Diagnosis not present

## 2022-02-13 DIAGNOSIS — Z87891 Personal history of nicotine dependence: Secondary | ICD-10-CM | POA: Diagnosis not present

## 2022-02-13 DIAGNOSIS — I69311 Memory deficit following cerebral infarction: Secondary | ICD-10-CM | POA: Diagnosis not present

## 2022-02-13 DIAGNOSIS — D631 Anemia in chronic kidney disease: Secondary | ICD-10-CM | POA: Diagnosis not present

## 2022-02-13 DIAGNOSIS — E1169 Type 2 diabetes mellitus with other specified complication: Secondary | ICD-10-CM | POA: Diagnosis not present

## 2022-02-13 DIAGNOSIS — Z794 Long term (current) use of insulin: Secondary | ICD-10-CM | POA: Diagnosis not present

## 2022-02-13 DIAGNOSIS — N1832 Chronic kidney disease, stage 3b: Secondary | ICD-10-CM | POA: Diagnosis not present

## 2022-02-13 DIAGNOSIS — I255 Ischemic cardiomyopathy: Secondary | ICD-10-CM | POA: Diagnosis not present

## 2022-02-13 DIAGNOSIS — I130 Hypertensive heart and chronic kidney disease with heart failure and stage 1 through stage 4 chronic kidney disease, or unspecified chronic kidney disease: Secondary | ICD-10-CM | POA: Diagnosis not present

## 2022-02-13 DIAGNOSIS — I5043 Acute on chronic combined systolic (congestive) and diastolic (congestive) heart failure: Secondary | ICD-10-CM | POA: Diagnosis not present

## 2022-02-13 DIAGNOSIS — Z7982 Long term (current) use of aspirin: Secondary | ICD-10-CM | POA: Diagnosis not present

## 2022-02-13 DIAGNOSIS — I5023 Acute on chronic systolic (congestive) heart failure: Secondary | ICD-10-CM | POA: Diagnosis not present

## 2022-02-13 DIAGNOSIS — I69351 Hemiplegia and hemiparesis following cerebral infarction affecting right dominant side: Secondary | ICD-10-CM | POA: Diagnosis not present

## 2022-02-13 DIAGNOSIS — E785 Hyperlipidemia, unspecified: Secondary | ICD-10-CM | POA: Diagnosis not present

## 2022-02-13 DIAGNOSIS — Z7901 Long term (current) use of anticoagulants: Secondary | ICD-10-CM | POA: Diagnosis not present

## 2022-02-13 LAB — COMPREHENSIVE METABOLIC PANEL
ALT: 30 IU/L (ref 0–44)
AST: 28 IU/L (ref 0–40)
Albumin/Globulin Ratio: 1.6 (ref 1.2–2.2)
Albumin: 4.8 g/dL (ref 3.8–4.8)
Alkaline Phosphatase: 118 IU/L (ref 44–121)
BUN/Creatinine Ratio: 16 (ref 10–24)
BUN: 63 mg/dL — ABNORMAL HIGH (ref 8–27)
Bilirubin Total: 0.3 mg/dL (ref 0.0–1.2)
CO2: 20 mmol/L (ref 20–29)
Calcium: 9.9 mg/dL (ref 8.6–10.2)
Chloride: 100 mmol/L (ref 96–106)
Creatinine, Ser: 3.96 mg/dL — ABNORMAL HIGH (ref 0.76–1.27)
Globulin, Total: 3 g/dL (ref 1.5–4.5)
Glucose: 246 mg/dL — ABNORMAL HIGH (ref 70–99)
Potassium: 5.3 mmol/L — ABNORMAL HIGH (ref 3.5–5.2)
Sodium: 137 mmol/L (ref 134–144)
Total Protein: 7.8 g/dL (ref 6.0–8.5)
eGFR: 16 mL/min/{1.73_m2} — ABNORMAL LOW (ref >59)

## 2022-02-13 NOTE — Telephone Encounter (Signed)
Contacted pt to go over lab results pt is aware and doesn't have any questions or concerns 

## 2022-02-17 NOTE — Progress Notes (Addendum)
Patient ID: Timothy Lambert, male   DOB: 18-Apr-1956, 66 y.o.   MRN: [id number]    Advanced Heart Failure Clinic Note   PCP: Ladell Pier, MD HF: Dr. Patrici Ranks [name] is a 66 y.o. male with history of prior ETOH abuse, DM, HTN, CAD s/p anterior STEMI 5/16, paroxysmal atrial fibrillation, and CVA. Patient was admitted to [facility] in 5/16 with an ETOH withdrawal seizure.  He was also noted to have anterior ST elevation and was sent for cath.  This showed diffuse disease with culprit being 100% mLAD stenosis.  He had DES to mLAD. Echo showed EF 30-35%.  Post-PCI, he had atrial fibrillation and had a CVA.  He had no residual deficits from the CVA.  He was managed w/ ASA and Brilinta.  He was thought to be a poor candidate for anticoagulation given history of ETOH abuse.    Repeat echo 8/16 showed improvement in EF to 50-55%.   He was being followed in the [facility] but lost to f/u. Last OV was 10/2015.    Admitted 4/23 w/ acute CHF, suspected shock and DKA. Started on BiPap, IV lasix and insulin, and abx for possible PNA. Echo showed EF 40-45%. [place]ated [place]nl [place] [address], suggestive of NSTEMI with ACS. No cath with lack of chest pain and AKI (SCr >4). Declined central access and blood products. In AF this admission and started on amiodarone. Drips weaned off and GDMT started, limited by CKD. Palliative consulted, remained DNR. Discharged home with [place] [facility], weight 167 lbs.  Seen for hospital f/u 01/30/22. Appeared volume up. Lasix increased to 40 mg daily.   Here today for CHF f/u. Since last visit his bidil has been increased to 1 tab TID and coreg to 6.25 BID for hypertension. BP elevated at 150/90 today but states he has not taken his meds yet. His weight is down 9 lb from last f/u. He is trying to maintain his weight around 155 lb. He is not adding salt to food and is trying to eat lots of salads. No ETOH use. Denies dyspnea, orthopnea, PND or leg edema. Taking medications as prescribed. No  bleeding issues with Eliquis.   PMH: 1. Type II diabetes 2. HTN 3. H/o heavy ETOH abuse: Has now quit. H/o withdrawal seizure. 4. GERD 5. CVA 02/19/15: Related to atrial fibrillation.  6. AKI with ACEI 7. CAD: Presented to [facility] with anterior STEMI 5/16.  LHC with 50% LM, 99% mLAD, 90% D1, 75% ramus, 70% mRCA.  Patient had DES to mLAD.   8. Ischemic cardiomyopathy: Echo (5/16) with EF 30-35% with wall motion abnormalities, normal RV size and systolic function. Echo (8/16) with EF 50-55%, mild mid-apical anteroseptal HK.  - Echo (4/23): EF 40-45% 9. Atrial fibrillation: Paroxysmal.  Noted after STEMI in 5/16.  Initially not anticoagulated due to need for ticagrelor and concern over ETOH abuse.   10. Carotid dopplers (8/16) with minimal carotid disease.   SH: Married, prior heavy ETOH (has quit).  Prior smoker (has quit).  Lives in [place].    FH: CAD  ROS: All systems reviewed and negative except as per HPI.   Current Outpatient Medications  Medication Sig Dispense Refill   acetaminophen (TYLENOL) 325 MG tablet Take 2 tablets (650 mg total) by mouth every 6 (six) hours as needed for mild pain (or Fever >/= 101).     amiodarone (PACERONE) 200 MG tablet Take 2 tablets twice daily until 01/29/22, then take 2 tablets once daily  from 01/30/22 to 02/05/22, then on 02/06/22 start taking 1 tablet daily. 90 tablet 0   apixaban (ELIQUIS) 5 MG TABS tablet Take 1 tablet (5 mg total) by mouth 2 (two) times daily. 180 tablet 1   atorvastatin (LIPITOR) 80 MG tablet Take 1 tablet (80 mg total) by mouth daily. 90 tablet 1   carvedilol (COREG) 6.25 MG tablet take 1 tablet by mouth twice a day 180 tablet 3   Continuous Blood Gluc Receiver (DEXCOM G6 RECEIVER) DEVI Use as directed. 1 each 0   Continuous Blood Gluc Sensor (DEXCOM G6 SENSOR) MISC Use as directed. 3 each 11   furosemide (LASIX) 20 MG tablet Take 2 tablets (40 mg total) by mouth daily. 180 tablet 1   glucose blood (ACCU-CHEK GUIDE) test strip  Use as instructed 3 times daily DX code E11.9 100 each 3   insulin glargine (LANTUS SOLOSTAR) 100 UNIT/ML Solostar Pen Inject 20 Units into the skin daily. 15 mL 2   Insulin Pen Needle (TECHLITE PEN NEEDLES) 32G X 4 MM MISC Use as directed for insulin 100 each 5   Insulin Syringe-Needle U-100 (INSULIN SYRINGE .5CC/30GX1/2") 30G X 1/2" 0.5 ML MISC Check blood sugar TID & QHS 100 each 2   isosorbide-hydrALAZINE (BIDIL) 20-37.5 MG tablet Take 1 tablet by mouth 3 (three) times daily. 270 tablet 1   Lancets (ACCU-CHEK SOFT TOUCH) lancets Use as instructed 100 each 12   TRUEPLUS LANCETS 28G MISC 1 each by Does not apply route 3 (three) times daily before meals. 100 each 12   No current facility-administered medications for this encounter.   BP (!) 150/90   Pulse 71   Wt 70.4 kg (155 lb 3.2 oz)   SpO2 100%   BMI 24.31 kg/m    Wt Readings from Last 3 Encounters:  02/26/22 70.4 kg (155 lb 3.2 oz)  02/12/22 69.8 kg (153 lb 12.8 oz)  01/30/22 74.5 kg (164 lb 3.2 oz)   Physical Exam: General:  Well appearing. Ambulated into clinic. Wife present. HEENT: normal Neck: supple. no JVD. Carotids 2+ bilat; no bruits.  Cor: PMI nondisplaced. Regular rate & rhythm. No rubs, gallops or murmurs. Lungs: clear Abdomen: soft, nontender, nondistended.  Extremities: no cyanosis, clubbing, rash, edema Neuro: alert & orientedx3, cranial nerves grossly intact. moves all 4 extremities w/o difficulty. Affect pleasant   Assessment/Plan: 1. Chronic systolic CHF: History of ischemic CMP in 2016 post-anterior MI that recovered over time.  Echo in 8/16 with EF 50-55%.  Echo this admission 4/23 showed EF 40-45% with apical HK, mild LVH, normal RV, dilated IVC.  - NHYA II. Volume appears stable. Continues on 40 mg lasix daily.  - Continue carvedilol 6.25 mg bid. - Continue Bidil 1 tab tid. - No MRA/ARNi/SGLT2i with CKD. - Recommended he monitor BP at home. Elevated today but has not taken any medications. - CMET,  BNP today 2. CAD: Anterior STEMI in 2016, DES to LAD.  NSTEMI 04/23. Had been off all meds x 1 month. ECG and elevate HS-Tnl suggestive of NSTEMI with ACS, but no cath due to lack of chest pain and creatinine up to > 4. This was in setting of DKA and hypertension. - No angina. - Continue ASA 81, coreg 6.25 BID, and atorvastatin 80 mg daily.  3. CKD stage IV: Baseline creatinine 2.7-3, now ~4. Baseline diabetic nephropathy. Last Scr 3.96 on 05/25. - Patient reports he would not want HD - No immediate need for HD, hopefully creatinine will start to trend down  over time.  - Needs good control of BP - Follows with Nephrology (Dr. Johnney Ou) 4. DM2: Recent DKA. He is on insulin. Per PCP. 5. H/o ETOH abuse: He denies further use. Wife has previously suggested otherwise. 6. Atrial fibrillation: Remote history of PAF with CVA.  He was not anticoagulated at that time due to ETOH abuse. Had atrial fibrillation during recnet admission.  He is now back in NSR.  - Continue amiodarone 200 mg daily. LFTs, TSH, Free T3/Free T4 today. - Continue Eliquis.  No bleeding issues. Hgb has been stable 9s-10s 7. Jehovah's Witness: no blood products. 8. Hypertension: Blood pressure elevated as above. Has not taken any meds today. - Regimen as in #1. - Recommended home blood pressure monitoring.   Follow up: Dr. Aundra Dubin as previously scheduled 08/23  Marlyce Huge, PA-C 02/26/22

## 2022-02-19 ENCOUNTER — Other Ambulatory Visit (HOSPITAL_COMMUNITY): Payer: Self-pay

## 2022-02-19 ENCOUNTER — Telehealth (HOSPITAL_COMMUNITY): Payer: Self-pay

## 2022-02-19 ENCOUNTER — Other Ambulatory Visit: Payer: Self-pay

## 2022-02-19 NOTE — Telephone Encounter (Signed)
Transitions of Care Pharmacy   Call attempted for a pharmacy transitions of care follow-up. HIPAA appropriate voicemail was left with call back information provided.   Call attempt #2. Will follow-up in 2-3 days.   Darcus Austin, [place] 02/19/2022 10:35 AM

## 2022-02-20 ENCOUNTER — Other Ambulatory Visit: Payer: Self-pay

## 2022-02-20 ENCOUNTER — Encounter (HOSPITAL_COMMUNITY): Payer: Medicare Other

## 2022-02-20 DIAGNOSIS — N184 Chronic kidney disease, stage 4 (severe): Secondary | ICD-10-CM | POA: Diagnosis not present

## 2022-02-20 DIAGNOSIS — R809 Proteinuria, unspecified: Secondary | ICD-10-CM | POA: Diagnosis not present

## 2022-02-20 DIAGNOSIS — E785 Hyperlipidemia, unspecified: Secondary | ICD-10-CM | POA: Diagnosis not present

## 2022-02-20 DIAGNOSIS — E1122 Type 2 diabetes mellitus with diabetic chronic kidney disease: Secondary | ICD-10-CM | POA: Diagnosis not present

## 2022-02-20 DIAGNOSIS — I5042 Chronic combined systolic (congestive) and diastolic (congestive) heart failure: Secondary | ICD-10-CM | POA: Diagnosis not present

## 2022-02-20 DIAGNOSIS — I129 Hypertensive chronic kidney disease with stage 1 through stage 4 chronic kidney disease, or unspecified chronic kidney disease: Secondary | ICD-10-CM | POA: Diagnosis not present

## 2022-02-20 DIAGNOSIS — D631 Anemia in chronic kidney disease: Secondary | ICD-10-CM | POA: Diagnosis not present

## 2022-02-20 MED ORDER — CARVEDILOL 6.25 MG PO TABS
ORAL_TABLET | ORAL | 3 refills | Status: DC
  Filled 2022-02-20: qty 180, 90d supply, fill #0

## 2022-02-23 ENCOUNTER — Other Ambulatory Visit: Payer: Self-pay

## 2022-02-25 ENCOUNTER — Encounter (HOSPITAL_COMMUNITY): Payer: Self-pay

## 2022-02-25 ENCOUNTER — Telehealth (HOSPITAL_COMMUNITY): Payer: Self-pay

## 2022-02-25 ENCOUNTER — Telehealth (HOSPITAL_COMMUNITY): Payer: Self-pay | Admitting: Pharmacist

## 2022-02-25 ENCOUNTER — Other Ambulatory Visit: Payer: Self-pay

## 2022-02-25 ENCOUNTER — Other Ambulatory Visit (HOSPITAL_COMMUNITY): Payer: Self-pay

## 2022-02-25 MED ORDER — CARVEDILOL 6.25 MG PO TABS
6.2500 mg | ORAL_TABLET | Freq: Two times a day (BID) | ORAL | 3 refills | Status: DC
  Filled 2022-02-25: qty 180, 90d supply, fill #0

## 2022-02-25 NOTE — Telephone Encounter (Signed)
Faxed FMLA paper work to [phone] on 02/25/2022 at 09.20 am

## 2022-02-26 ENCOUNTER — Encounter (HOSPITAL_COMMUNITY): Payer: Self-pay

## 2022-02-26 ENCOUNTER — Ambulatory Visit (HOSPITAL_COMMUNITY)
Admission: RE | Admit: 2022-02-26 | Discharge: 2022-02-26 | Disposition: A | Discharge status: Home / Self Care | Payer: Medicare Other | Source: Ambulatory Visit | Attending: Physician Assistant | Admitting: Physician Assistant

## 2022-02-26 VITALS — BP 150/90 | HR 71 | Wt 155.2 lb

## 2022-02-26 DIAGNOSIS — Z955 Presence of coronary angioplasty implant and graft: Secondary | ICD-10-CM | POA: Diagnosis not present

## 2022-02-26 DIAGNOSIS — F1010 Alcohol abuse, uncomplicated: Secondary | ICD-10-CM | POA: Insufficient documentation

## 2022-02-26 DIAGNOSIS — Z79899 Other long term (current) drug therapy: Secondary | ICD-10-CM | POA: Insufficient documentation

## 2022-02-26 DIAGNOSIS — Z794 Long term (current) use of insulin: Secondary | ICD-10-CM | POA: Insufficient documentation

## 2022-02-26 DIAGNOSIS — I130 Hypertensive heart and chronic kidney disease with heart failure and stage 1 through stage 4 chronic kidney disease, or unspecified chronic kidney disease: Secondary | ICD-10-CM | POA: Diagnosis not present

## 2022-02-26 DIAGNOSIS — I255 Ischemic cardiomyopathy: Secondary | ICD-10-CM | POA: Insufficient documentation

## 2022-02-26 DIAGNOSIS — I480 Paroxysmal atrial fibrillation: Secondary | ICD-10-CM | POA: Diagnosis not present

## 2022-02-26 DIAGNOSIS — I5022 Chronic systolic (congestive) heart failure: Secondary | ICD-10-CM | POA: Diagnosis not present

## 2022-02-26 DIAGNOSIS — I252 Old myocardial infarction: Secondary | ICD-10-CM | POA: Diagnosis not present

## 2022-02-26 DIAGNOSIS — Z7901 Long term (current) use of anticoagulants: Secondary | ICD-10-CM | POA: Insufficient documentation

## 2022-02-26 DIAGNOSIS — E1110 Type 2 diabetes mellitus with ketoacidosis without coma: Secondary | ICD-10-CM | POA: Insufficient documentation

## 2022-02-26 DIAGNOSIS — E1122 Type 2 diabetes mellitus with diabetic chronic kidney disease: Secondary | ICD-10-CM | POA: Insufficient documentation

## 2022-02-26 DIAGNOSIS — N184 Chronic kidney disease, stage 4 (severe): Secondary | ICD-10-CM | POA: Insufficient documentation

## 2022-02-26 DIAGNOSIS — I2510 Atherosclerotic heart disease of native coronary artery without angina pectoris: Secondary | ICD-10-CM | POA: Insufficient documentation

## 2022-02-26 DIAGNOSIS — I10 Essential (primary) hypertension: Secondary | ICD-10-CM

## 2022-02-26 DIAGNOSIS — Z8673 Personal history of transient ischemic attack (TIA), and cerebral infarction without residual deficits: Secondary | ICD-10-CM | POA: Diagnosis not present

## 2022-02-26 LAB — COMPREHENSIVE METABOLIC PANEL
ALT: 38 U/L (ref 0–44)
AST: 30 U/L (ref 15–41)
Albumin: 3.8 g/dL (ref 3.5–5.0)
Alkaline Phosphatase: 82 U/L (ref 38–126)
Anion gap: 10 (ref 5–15)
BUN: 65 mg/dL — ABNORMAL HIGH (ref 8–23)
CO2: 24 mmol/L (ref 22–32)
Calcium: 9.4 mg/dL (ref 8.9–10.3)
Chloride: 107 mmol/L (ref 98–111)
Creatinine, Ser: 4.08 mg/dL — ABNORMAL HIGH (ref 0.61–1.24)
GFR, Estimated: 15 mL/min — ABNORMAL LOW (ref >60)
Glucose, Bld: 159 mg/dL — ABNORMAL HIGH (ref 70–99)
Potassium: 4.9 mmol/L (ref 3.5–5.1)
Sodium: 141 mmol/L (ref 135–145)
Total Bilirubin: 0.5 mg/dL (ref 0.3–1.2)
Total Protein: 6.7 g/dL (ref 6.5–8.1)

## 2022-02-26 LAB — T4, FREE: Free T4: 1.35 ng/dL — ABNORMAL HIGH (ref 0.61–1.12)

## 2022-02-26 LAB — BRAIN NATRIURETIC PEPTIDE: B Natriuretic Peptide: 181.6 pg/mL — ABNORMAL HIGH (ref 0.0–100.0)

## 2022-02-26 LAB — TSH: TSH: 3.241 u[IU]/mL (ref 0.350–4.500)

## 2022-02-26 NOTE — Patient Instructions (Signed)
It was great to see you today! No medication changes are needed at this time.  Labs today We will only contact you if something comes back abnormal or we need to make some changes. Otherwise no news is good news!  Keep follow up as scheduled with Dr McLean   Do the following things EVERYDAY: Weigh yourself in the morning before breakfast. Write it down and keep it in a log. Take your medicines as prescribed Eat low salt foods--Limit salt (sodium) to 2000 mg per day.  Stay as active as you can everyday Limit all fluids for the day to less than 2 liters  At the [facility] Failure Clinic, you and your health needs are our priority. As part of our continuing mission to provide you with exceptional heart care, we have created designated Provider Care Teams. These Care Teams include your primary Cardiologist (physician) and Advanced Practice Providers (APPs- Physician Assistants and Nurse Practitioners) who all work together to provide you with the care you need, when you need it.   You may see any of the following providers on your designated Care Team at your next follow up: Dr Daniel Bensimhon Dr Dalton McLean Amy Clegg, NP Brittainy Simmons, PA Jessica Milford,NP Lindsay Finch, PA Lauren Kemp, PharmD   Please be sure to bring in all your medications bottles to every appointment.   If you have any questions or concerns before your next appointment please send us a message through mychart or call our office at [phone].    TO LEAVE A MESSAGE FOR THE NURSE SELECT OPTION 2, PLEASE LEAVE A MESSAGE INCLUDING: YOUR NAME DATE OF BIRTH CALL BACK NUMBER REASON FOR CALL**this is important as we prioritize the call backs  YOU WILL RECEIVE A CALL BACK THE SAME DAY AS LONG AS YOU CALL BEFORE 4:00 PM   

## 2022-02-27 ENCOUNTER — Telehealth (HOSPITAL_COMMUNITY): Payer: Self-pay | Admitting: Cardiology

## 2022-02-27 DIAGNOSIS — I214 Non-ST elevation (NSTEMI) myocardial infarction: Secondary | ICD-10-CM | POA: Diagnosis not present

## 2022-02-27 DIAGNOSIS — I5043 Acute on chronic combined systolic (congestive) and diastolic (congestive) heart failure: Secondary | ICD-10-CM | POA: Diagnosis not present

## 2022-02-27 DIAGNOSIS — N1832 Chronic kidney disease, stage 3b: Secondary | ICD-10-CM | POA: Diagnosis not present

## 2022-02-27 DIAGNOSIS — Z87891 Personal history of nicotine dependence: Secondary | ICD-10-CM | POA: Diagnosis not present

## 2022-02-27 DIAGNOSIS — I69351 Hemiplegia and hemiparesis following cerebral infarction affecting right dominant side: Secondary | ICD-10-CM | POA: Diagnosis not present

## 2022-02-27 DIAGNOSIS — I480 Paroxysmal atrial fibrillation: Secondary | ICD-10-CM | POA: Diagnosis not present

## 2022-02-27 DIAGNOSIS — K219 Gastro-esophageal reflux disease without esophagitis: Secondary | ICD-10-CM | POA: Diagnosis not present

## 2022-02-27 DIAGNOSIS — I255 Ischemic cardiomyopathy: Secondary | ICD-10-CM | POA: Diagnosis not present

## 2022-02-27 DIAGNOSIS — I2510 Atherosclerotic heart disease of native coronary artery without angina pectoris: Secondary | ICD-10-CM | POA: Diagnosis not present

## 2022-02-27 DIAGNOSIS — I130 Hypertensive heart and chronic kidney disease with heart failure and stage 1 through stage 4 chronic kidney disease, or unspecified chronic kidney disease: Secondary | ICD-10-CM | POA: Diagnosis not present

## 2022-02-27 DIAGNOSIS — Z794 Long term (current) use of insulin: Secondary | ICD-10-CM | POA: Diagnosis not present

## 2022-02-27 DIAGNOSIS — I69311 Memory deficit following cerebral infarction: Secondary | ICD-10-CM | POA: Diagnosis not present

## 2022-02-27 DIAGNOSIS — I5023 Acute on chronic systolic (congestive) heart failure: Secondary | ICD-10-CM | POA: Diagnosis not present

## 2022-02-27 DIAGNOSIS — Z7982 Long term (current) use of aspirin: Secondary | ICD-10-CM | POA: Diagnosis not present

## 2022-02-27 DIAGNOSIS — D631 Anemia in chronic kidney disease: Secondary | ICD-10-CM | POA: Diagnosis not present

## 2022-02-27 DIAGNOSIS — Z7901 Long term (current) use of anticoagulants: Secondary | ICD-10-CM | POA: Diagnosis not present

## 2022-02-27 DIAGNOSIS — Z9181 History of falling: Secondary | ICD-10-CM | POA: Diagnosis not present

## 2022-02-27 DIAGNOSIS — R7989 Other specified abnormal findings of blood chemistry: Secondary | ICD-10-CM

## 2022-02-27 DIAGNOSIS — E1165 Type 2 diabetes mellitus with hyperglycemia: Secondary | ICD-10-CM | POA: Diagnosis not present

## 2022-02-27 DIAGNOSIS — E1169 Type 2 diabetes mellitus with other specified complication: Secondary | ICD-10-CM | POA: Diagnosis not present

## 2022-02-27 DIAGNOSIS — E1122 Type 2 diabetes mellitus with diabetic chronic kidney disease: Secondary | ICD-10-CM | POA: Diagnosis not present

## 2022-02-27 DIAGNOSIS — E785 Hyperlipidemia, unspecified: Secondary | ICD-10-CM | POA: Diagnosis not present

## 2022-02-27 NOTE — Telephone Encounter (Addendum)
Patient called.  Patient aware. Repeat labs 7/10   ----- Message from Joette Catching, PA-C sent at 02/27/2022  1:30 PM EDT ----- TSH okay. Free T4 mildly elevated. On amiodarone. Check TSH and Free T4 again in about 4 weeks.

## 2022-02-28 LAB — T3, FREE: T3, Free: 2.1 pg/mL (ref 2.0–4.4)

## 2022-03-04 ENCOUNTER — Encounter: Payer: Self-pay | Admitting: Podiatry

## 2022-03-04 ENCOUNTER — Ambulatory Visit: Payer: Medicare Other | Admitting: Podiatry

## 2022-03-04 DIAGNOSIS — B351 Tinea unguium: Secondary | ICD-10-CM | POA: Diagnosis not present

## 2022-03-04 DIAGNOSIS — M79675 Pain in left toe(s): Secondary | ICD-10-CM

## 2022-03-04 DIAGNOSIS — Z7901 Long term (current) use of anticoagulants: Secondary | ICD-10-CM

## 2022-03-04 DIAGNOSIS — M79674 Pain in right toe(s): Secondary | ICD-10-CM

## 2022-03-04 DIAGNOSIS — E785 Hyperlipidemia, unspecified: Secondary | ICD-10-CM

## 2022-03-04 DIAGNOSIS — E1169 Type 2 diabetes mellitus with other specified complication: Secondary | ICD-10-CM

## 2022-03-04 NOTE — Progress Notes (Signed)
  Subjective:  Patient ID: Timothy Lambert, male    DOB: October 10, 1955,  MRN: [id number]  Chief Complaint  Patient presents with   Diabetes    np diabetic foot care   Nail Problem    66 y.o. male presents with the above complaint. History confirmed with patient.  Nails are thickened elongated and causing discomfort he is unable to cut them anymore he is on Eliquis and has type 2 diabetes  Objective:  Physical Exam: warm, good capillary refill, no trophic changes or ulcerative lesions, normal DP and PT pulses, normal monofilament exam, and normal sensory exam. Left Foot: dystrophic yellowed discolored nail plates with subungual debris Right Foot: dystrophic yellowed discolored nail plates with subungual debris  Assessment:   1. Pain due to onychomycosis of toenails of both feet   2. Type 2 diabetes mellitus with hyperlipidemia ([place])   3. Chronic anticoagulation      Plan:  Patient was evaluated and treated and all questions answered.  Discussed the etiology and treatment options for the condition in detail with the patient. Educated patient on the topical and oral treatment options for mycotic nails. Recommended debridement of the nails today. Sharp and mechanical debridement performed of all painful and mycotic nails today. Nails debrided in length and thickness using a nail nipper to level of comfort. Discussed treatment options including appropriate shoe gear. Follow up as needed for painful nails.   Patient educated on diabetes. Discussed proper diabetic foot care and discussed risks and complications of disease. Educated patient in depth on reasons to return to the office immediately should he/she discover anything concerning or new on the feet. All questions answered. Discussed proper shoes as well.    Return in about 3 months (around 06/04/2022) for at risk diabetic foot care.

## 2022-03-06 ENCOUNTER — Telehealth (HOSPITAL_COMMUNITY): Payer: Self-pay

## 2022-03-06 NOTE — Telephone Encounter (Signed)
[facility] claims forms faxed on 03/06/2022. Wife called and informed.

## 2022-03-20 ENCOUNTER — Other Ambulatory Visit: Payer: Self-pay

## 2022-03-20 ENCOUNTER — Encounter: Payer: Self-pay | Admitting: Pharmacist

## 2022-03-20 ENCOUNTER — Ambulatory Visit: Payer: Medicare Other | Attending: Internal Medicine | Admitting: Pharmacist

## 2022-03-20 ENCOUNTER — Other Ambulatory Visit: Payer: Self-pay | Admitting: Internal Medicine

## 2022-03-20 VITALS — BP 181/85 | HR 70

## 2022-03-20 DIAGNOSIS — N185 Chronic kidney disease, stage 5: Secondary | ICD-10-CM | POA: Diagnosis not present

## 2022-03-20 DIAGNOSIS — I120 Hypertensive chronic kidney disease with stage 5 chronic kidney disease or end stage renal disease: Secondary | ICD-10-CM | POA: Diagnosis not present

## 2022-03-20 DIAGNOSIS — E1122 Type 2 diabetes mellitus with diabetic chronic kidney disease: Secondary | ICD-10-CM | POA: Diagnosis not present

## 2022-03-20 MED ORDER — AMIODARONE HCL 200 MG PO TABS
200.0000 mg | ORAL_TABLET | Freq: Every day | ORAL | 0 refills | Status: DC
  Filled 2022-03-20: qty 30, 30d supply, fill #0

## 2022-03-20 NOTE — Telephone Encounter (Signed)
Requested medication (s) are due for refill today:   Yes  Requested medication (s) are on the active medication list:   Yes  Future visit scheduled:   Has appt today at 1:20 with pharmacist, Lurena Joiner   Last ordered: 01/22/2022 #90, 0 refill  Returned because this is a non delegated refill.   Has appt today with pharmacist at [facility]   Requested Prescriptions  Pending Prescriptions Disp Refills   amiodarone (PACERONE) 200 MG tablet 90 tablet 0    Sig: Take 2 tablets twice daily until 01/29/22, then take 2 tablets once daily from 01/30/22 to 02/05/22, then on 02/06/22 start taking 1 tablet daily.     Not Delegated - Cardiovascular: Antiarrhythmic Agents - amiodarone Failed - 03/20/2022  1:02 PM      Failed - This refill cannot be delegated      Failed - Manual Review: Eye exam recommended every 12 months      Failed - Last BP in normal range    BP Readings from Last 1 Encounters:  02/26/22 (!) 150/90         Passed - TSH in normal range and within 360 days    TSH  Date Value Ref Range Status  02/26/2022 3.241 0.350 - 4.500 uIU/mL Final    Comment:    Performed by a 3rd Generation assay with a functional sensitivity of <=0.01 uIU/mL. Performed at [facility], [place] [address] [place], [place] [address]   12/10/2017 2.990 0.450 - 4.500 uIU/mL Final         Passed - Mg Level in normal range and within 360 days    Magnesium  Date Value Ref Range Status  01/19/2022 1.9 1.7 - 2.4 mg/dL Final    Comment:    Performed at [facility], [place] [address], [place], [place] [address]         Passed - K in normal range and within 180 days    Potassium  Date Value Ref Range Status  02/26/2022 4.9 3.5 - 5.1 mmol/L Final         Passed - AST in normal range and within 180 days    AST  Date Value Ref Range Status  02/26/2022 30 15 - 41 U/L Final         Passed - ALT in normal range and within 180 days    ALT  Date Value Ref Range Status  02/26/2022 38 0 - 44 U/L Final          Passed - Patient had ECG in the last 180 days      Passed - Patient is not pregnant      Passed - Last Heart Rate in normal range    Pulse Readings from Last 1 Encounters:  02/26/22 71         Passed - Valid encounter within last 6 months    Recent Outpatient Visits           1 month ago Hospital discharge follow-up   [place] Karle Plumber B, MD   2 months ago Type 2 diabetes mellitus with stage 3b chronic kidney disease, with long-term current use of insulin ([place])   Primary Care at [facility], Cari S, PA-C   6 months ago Type 2 diabetes mellitus with stage 3b chronic kidney disease, with long-term current use of insulin ([place])   [place] Ladell Pier, MD   9  months ago Encounter for [employer] annual wellness exam   [place], RPH-CPP   1 year ago Type 2 diabetes mellitus with stage 3b chronic kidney disease, with long-term current use of insulin [facility])   [place], MD       Future Appointments             Today Daisy Blossom, Jarome Matin, [place]   In 2 months Ladell Pier, MD [place] - Patient had chest x-ray within the last 6 months

## 2022-03-20 NOTE — Progress Notes (Signed)
   S:    PCP: Dr. Wynetta Emery  Patient arrives in good spirits. Presents to the clinic for hypertension evaluation, counseling, and management.  Patient was referred and last seen by Primary Care Provider on 02/12/22. BP was 191/89 mmHg at that visit. Dr. Wynetta Emery increased pt's BiDil dose. Since then, he was seen at the CHF clinic. BP was 150/90 at that visit but pt denied having taken his medications prior to that visit. He is not a candidate for MRA/ARNi/SGLT-2i due to his CKD.   Patient denies chest pains or dyspnea. No HA, blurred vision, or dizziness. Denies LE edema.   Medication adherence reported. Denies any missed doses recently but he has not taken his medications today. He usually takes his AM doses of BiDil and hydralazine between 10a-12p.  Current BP medications: carvedilol 6.25 mg BID **Takes furosdemide 40 mg daily for fluid control **Takes nitrate therapy as BiDil 20-37.5 mg TID  Dietary habits include: reports cutting out caffeine but admits to drinking at least 1 Diet Coke a day; compliant with salt restriction  Exercise habits include: denies  Family / Social history:  - FHx: DM -Tobacco: former smoker -Alcohol: stopped drinking alcohol in 2016  O: Vitals:   03/20/22 1359  BP: (!) 181/85  Pulse: 70    Last 3 Office BP readings: BP Readings from Last 3 Encounters:  03/20/22 (!) 181/85  02/26/22 (!) 150/90  02/12/22 (!) 191/89   Does not check blood pressure at home.   BMET    Component Value Date/Time   NA 141 02/26/2022 0853   NA 137 02/12/2022 1214   K 4.9 02/26/2022 0853   CL 107 02/26/2022 0853   CO2 24 02/26/2022 0853   GLUCOSE 159 (H) 02/26/2022 0853   BUN 65 (H) 02/26/2022 0853   BUN 63 (H) 02/12/2022 1214   CREATININE 4.08 (H) 02/26/2022 0853   CREATININE 1.19 09/06/2015 1133   CALCIUM 9.4 02/26/2022 0853   CALCIUM 8.4 (L) 01/18/2022 0546   GFRNONAA 15 (L) 02/26/2022 0853   GFRAA 32 (L) 05/17/2020 1746    Renal function: CrCl cannot be  calculated (Patient's most recent lab result is older than the maximum 21 days allowed.).  Clinical ASCVD: Yes  The ASCVD Risk score (Arnett DK, et al., 2019) failed to calculate for the following reasons:   The patient has a prior MI or stroke diagnosis  A/P: Hypertension longstanding currently uncontrolled on current medications. BP Goal = < 130/80 mmHg. Medication adherence reported but he did not take BP medications yet today. He is also not checking at home. He insists that his BP is closer to goal at home. -Compliance encouraged. No med changes today. -Encouraged pt to take his medications before coming to his visit in 1 month for a recheck.  -Encouraged pt to start checking at home. He plans to do so. -Continued current regimen.  -Counseled on lifestyle modifications for blood pressure control including reduced dietary sodium, increased exercise, adequate sleep.  Results reviewed and written information provided.   Total time in face-to-face counseling 15 minutes.   F/U Clinic Visit in 1 month.   Benard Halsted, PharmD, [place], [place] [phone]

## 2022-04-10 DIAGNOSIS — E1122 Type 2 diabetes mellitus with diabetic chronic kidney disease: Secondary | ICD-10-CM | POA: Diagnosis not present

## 2022-04-10 DIAGNOSIS — I129 Hypertensive chronic kidney disease with stage 1 through stage 4 chronic kidney disease, or unspecified chronic kidney disease: Secondary | ICD-10-CM | POA: Diagnosis not present

## 2022-04-10 DIAGNOSIS — R809 Proteinuria, unspecified: Secondary | ICD-10-CM | POA: Diagnosis not present

## 2022-04-10 DIAGNOSIS — I5042 Chronic combined systolic (congestive) and diastolic (congestive) heart failure: Secondary | ICD-10-CM | POA: Diagnosis not present

## 2022-04-10 DIAGNOSIS — N184 Chronic kidney disease, stage 4 (severe): Secondary | ICD-10-CM | POA: Diagnosis not present

## 2022-04-10 DIAGNOSIS — N189 Chronic kidney disease, unspecified: Secondary | ICD-10-CM | POA: Diagnosis not present

## 2022-04-10 DIAGNOSIS — D631 Anemia in chronic kidney disease: Secondary | ICD-10-CM | POA: Diagnosis not present

## 2022-04-10 DIAGNOSIS — E785 Hyperlipidemia, unspecified: Secondary | ICD-10-CM | POA: Diagnosis not present

## 2022-04-21 ENCOUNTER — Other Ambulatory Visit: Payer: Self-pay

## 2022-04-21 ENCOUNTER — Other Ambulatory Visit: Payer: Self-pay | Admitting: Internal Medicine

## 2022-04-22 ENCOUNTER — Encounter: Payer: Self-pay | Admitting: Internal Medicine

## 2022-04-22 ENCOUNTER — Other Ambulatory Visit: Payer: Self-pay

## 2022-04-22 NOTE — Progress Notes (Signed)
Note received from patient's nephrologist Dr. Johnney Ou.  Patient seen 04/10/2022. BP was 140/100. CKD stage IV: Patient goals of care most consistent with conservative management and not dialysis.  Encourage patient to discuss with his wife so that his wishes are known.  No AV access as no plans for dialysis. Hypertension renal disease: Taking meds 10 AM and 6 PM.  Request that he takes the carvedilol every 12 hours. GFR limits therapy such as RAASi and SGLT2i Anemia associated with chronic renal failure: Start ESA if needed.  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  On lisinopril. Labs: H/H 12/35.4 Iron 64/ferritin 148/iron saturation 26% BUN 52/creatinine 4.2/GFR 15 PTH intact 170

## 2022-04-24 ENCOUNTER — Other Ambulatory Visit: Payer: Self-pay

## 2022-05-05 ENCOUNTER — Other Ambulatory Visit: Payer: Self-pay

## 2022-05-05 ENCOUNTER — Encounter (HOSPITAL_COMMUNITY): Payer: Self-pay | Admitting: Cardiology

## 2022-05-05 ENCOUNTER — Ambulatory Visit (HOSPITAL_COMMUNITY)
Admission: RE | Admit: 2022-05-05 | Discharge: 2022-05-05 | Disposition: A | Discharge status: Home / Self Care | Payer: Medicare Other | Source: Ambulatory Visit | Attending: Cardiology | Admitting: Cardiology

## 2022-05-05 VITALS — BP 190/100 | HR 76 | Wt 164.6 lb

## 2022-05-05 DIAGNOSIS — Z8673 Personal history of transient ischemic attack (TIA), and cerebral infarction without residual deficits: Secondary | ICD-10-CM | POA: Insufficient documentation

## 2022-05-05 DIAGNOSIS — I5042 Chronic combined systolic (congestive) and diastolic (congestive) heart failure: Secondary | ICD-10-CM | POA: Diagnosis not present

## 2022-05-05 DIAGNOSIS — I252 Old myocardial infarction: Secondary | ICD-10-CM | POA: Insufficient documentation

## 2022-05-05 DIAGNOSIS — I5022 Chronic systolic (congestive) heart failure: Secondary | ICD-10-CM

## 2022-05-05 DIAGNOSIS — Z79899 Other long term (current) drug therapy: Secondary | ICD-10-CM | POA: Diagnosis not present

## 2022-05-05 DIAGNOSIS — Z7901 Long term (current) use of anticoagulants: Secondary | ICD-10-CM | POA: Insufficient documentation

## 2022-05-05 DIAGNOSIS — E1110 Type 2 diabetes mellitus with ketoacidosis without coma: Secondary | ICD-10-CM | POA: Insufficient documentation

## 2022-05-05 DIAGNOSIS — I2510 Atherosclerotic heart disease of native coronary artery without angina pectoris: Secondary | ICD-10-CM | POA: Insufficient documentation

## 2022-05-05 DIAGNOSIS — N185 Chronic kidney disease, stage 5: Secondary | ICD-10-CM | POA: Diagnosis not present

## 2022-05-05 DIAGNOSIS — N184 Chronic kidney disease, stage 4 (severe): Secondary | ICD-10-CM | POA: Diagnosis not present

## 2022-05-05 DIAGNOSIS — I255 Ischemic cardiomyopathy: Secondary | ICD-10-CM | POA: Insufficient documentation

## 2022-05-05 DIAGNOSIS — E782 Mixed hyperlipidemia: Secondary | ICD-10-CM

## 2022-05-05 DIAGNOSIS — E1122 Type 2 diabetes mellitus with diabetic chronic kidney disease: Secondary | ICD-10-CM | POA: Insufficient documentation

## 2022-05-05 DIAGNOSIS — I130 Hypertensive heart and chronic kidney disease with heart failure and stage 1 through stage 4 chronic kidney disease, or unspecified chronic kidney disease: Secondary | ICD-10-CM | POA: Insufficient documentation

## 2022-05-05 DIAGNOSIS — I480 Paroxysmal atrial fibrillation: Secondary | ICD-10-CM | POA: Insufficient documentation

## 2022-05-05 DIAGNOSIS — F1021 Alcohol dependence, in remission: Secondary | ICD-10-CM | POA: Diagnosis not present

## 2022-05-05 DIAGNOSIS — I120 Hypertensive chronic kidney disease with stage 5 chronic kidney disease or end stage renal disease: Secondary | ICD-10-CM

## 2022-05-05 DIAGNOSIS — Z955 Presence of coronary angioplasty implant and graft: Secondary | ICD-10-CM | POA: Insufficient documentation

## 2022-05-05 DIAGNOSIS — Z794 Long term (current) use of insulin: Secondary | ICD-10-CM | POA: Insufficient documentation

## 2022-05-05 LAB — CBC
HCT: 35.3 % — ABNORMAL LOW (ref 39.0–52.0)
Hemoglobin: 11.8 g/dL — ABNORMAL LOW (ref 13.0–17.0)
MCH: 29.9 pg (ref 26.0–34.0)
MCHC: 33.4 g/dL (ref 30.0–36.0)
MCV: 89.6 fL (ref 80.0–100.0)
Platelets: 152 10*3/uL (ref 150–400)
RBC: 3.94 MIL/uL — ABNORMAL LOW (ref 4.22–5.81)
RDW: 13.7 % (ref 11.5–15.5)
WBC: 6.2 10*3/uL (ref 4.0–10.5)
nRBC: 0 % (ref 0.0–0.2)

## 2022-05-05 LAB — COMPREHENSIVE METABOLIC PANEL
ALT: 20 U/L (ref 0–44)
AST: 18 U/L (ref 15–41)
Albumin: 3.8 g/dL (ref 3.5–5.0)
Alkaline Phosphatase: 97 U/L (ref 38–126)
Anion gap: 8 (ref 5–15)
BUN: 47 mg/dL — ABNORMAL HIGH (ref 8–23)
CO2: 24 mmol/L (ref 22–32)
Calcium: 9 mg/dL (ref 8.9–10.3)
Chloride: 105 mmol/L (ref 98–111)
Creatinine, Ser: 3.56 mg/dL — ABNORMAL HIGH (ref 0.61–1.24)
GFR, Estimated: 18 mL/min — ABNORMAL LOW (ref >60)
Glucose, Bld: 227 mg/dL — ABNORMAL HIGH (ref 70–99)
Potassium: 4.6 mmol/L (ref 3.5–5.1)
Sodium: 137 mmol/L (ref 135–145)
Total Bilirubin: 0.5 mg/dL (ref 0.3–1.2)
Total Protein: 6.8 g/dL (ref 6.5–8.1)

## 2022-05-05 LAB — LIPID PANEL
Cholesterol: 126 mg/dL (ref 0–200)
HDL: 42 mg/dL (ref >40)
LDL Cholesterol: 71 mg/dL (ref 0–99)
Total CHOL/HDL Ratio: 3 RATIO
Triglycerides: 64 mg/dL (ref <150)
VLDL: 13 mg/dL (ref 0–40)

## 2022-05-05 LAB — TSH: TSH: 3.93 u[IU]/mL (ref 0.350–4.500)

## 2022-05-05 MED ORDER — AMIODARONE HCL 200 MG PO TABS
200.0000 mg | ORAL_TABLET | Freq: Every day | ORAL | 3 refills | Status: DC
  Filled 2022-05-05: qty 90, 90d supply, fill #0

## 2022-05-05 MED ORDER — ISOSORB DINITRATE-HYDRALAZINE 20-37.5 MG PO TABS
2.0000 | ORAL_TABLET | Freq: Three times a day (TID) | ORAL | 3 refills | Status: DC
  Filled 2022-05-05: qty 270, 45d supply, fill #0

## 2022-05-05 MED ORDER — CARVEDILOL 12.5 MG PO TABS
12.5000 mg | ORAL_TABLET | Freq: Two times a day (BID) | ORAL | 3 refills | Status: DC
  Filled 2022-05-05: qty 180, 90d supply, fill #0

## 2022-05-05 NOTE — Patient Instructions (Signed)
Increase Carvedilol to 12.5mg  Twice daily  Increase Bidil to 2 Tabs Three times a day  Labs done today, your results will be available in MyChart, we will contact you for abnormal readings.  Your physician has requested that you have an echocardiogram. Echocardiography is a painless test that uses sound waves to create images of your heart. It provides your doctor with information about the size and shape of your heart and how well your heart's chambers and valves are working. This procedure takes approximately one hour. There are no restrictions for this procedure.  Please follow up with our heart failure pharmacist in 3 weeks  Your physician recommends that you schedule a follow-up appointment in: 3 months with echocardiogram   If you have any questions or concerns before your next appointment please send [place] a message through [place] or call our office at [phone].    TO LEAVE A MESSAGE FOR THE NURSE SELECT OPTION 2, PLEASE LEAVE A MESSAGE INCLUDING: YOUR NAME DATE OF BIRTH CALL BACK NUMBER REASON FOR CALL**this is important as we prioritize the call backs  YOU WILL RECEIVE A CALL BACK THE SAME DAY AS LONG AS YOU CALL BEFORE 4:00 PM  At the [facility], you and your health needs are our priority. As part of our continuing mission to provide you with exceptional heart care, we have created designated Provider Care Teams. These Care Teams include your primary Cardiologist (physician) and Advanced Practice Providers (APPs- Physician Assistants and Nurse Practitioners) who all work together to provide you with the care you need, when you need it.   You may see any of the following providers on your designated Care Team at your next follow up: Dr Glori Bickers Dr Haynes Kerns, NP Lyda Jester, [place] [facility] [place], [place] Audry Riles, PharmD   Please be sure to bring in all your medications bottles to every appointment.

## 2022-05-05 NOTE — Progress Notes (Signed)
Patient ID: Timothy Lambert, male   DOB: 1956-09-09, 66 y.o.   MRN: [id number]    Advanced Heart Failure Clinic Note   PCP: Ladell Pier, MD HF: Dr. Patrici Ranks Latour is a 66 y.o. male with history of prior ETOH abuse, DM, HTN, CAD s/p anterior STEMI 5/16, paroxysmal atrial fibrillation, and CVA. Patient was admitted to [facility] in 5/16 with an ETOH withdrawal seizure.  He was also noted to have anterior ST elevation and was sent for cath.  This showed diffuse disease with culprit being 100% mLAD stenosis.  He had DES to mLAD. Echo showed EF 30-35%.  Post-PCI, he had atrial fibrillation and had a CVA.  He had no residual deficits from the CVA.  He was managed w/ ASA and Brilinta.  He was thought to be a poor candidate for anticoagulation given history of ETOH abuse.    Repeat echo 8/16 showed improvement in EF to 50-55%.   Admitted 4/23 w/ acute CHF, suspected shock and DKA. Started on BiPap, IV lasix and insulin, and abx for possible PNA. Echo showed EF 40-45%. [place] [address], suggestive of NSTEMI with ACS. No cath with lack of chest pain and AKI (SCr >4). Declined central access and blood products. In AF this admission and started on amiodarone. Drips weaned off and GDMT started, limited by CKD. Palliative consulted, was DNR. Discharged home with [facility], weight 167 lbs.  Here today for CHF f/u. BP is very high today, he has not taken any of his morning medications yet.  He says that his BP is high even when he takes his meds (though not this high). No significant exertional dyspnea.  No trouble walking up a flight of stairs. No lightheadedness.  No ETOH use.  No BRBPR/melena.   Labs (6/23): BNP 182, K 4.9, creatinine 4.08, LFTs normal, TSH normal  ECG (personally reviewed): NSR, LAFB, LVH, inferior Qs  PMH: 1. Type II diabetes 2. HTN 3. H/o heavy ETOH abuse: Has now quit. H/o withdrawal seizure. 4. GERD 5. CVA 02/19/15: Related to atrial fibrillation.  6. CKD  stage 4: AKI with ACEI 7. CAD: Presented to [facility] with anterior STEMI 5/16.  LHC with 50% LM, 99% mLAD, 90% D1, 75% ramus, 70% mRCA.  Patient had DES to mLAD.   8. Ischemic cardiomyopathy: Echo (5/16) with EF 30-35% with wall motion abnormalities, normal RV size and systolic function. Echo (8/16) with EF 50-55%, mild mid-apical anteroseptal HK.  - Echo (4/23): EF 40-45% 9. Atrial fibrillation: Paroxysmal.  Noted after STEMI in 5/16.  Initially not anticoagulated due to need for ticagrelor and concern over ETOH abuse.   10. Carotid dopplers (8/16) with minimal carotid disease.   SH: Married, prior heavy ETOH (has quit).  Prior smoker (has quit).  Lives in [place].  Jehovah's Witness.   FH: CAD  ROS: All systems reviewed and negative except as per HPI.   Current Outpatient Medications  Medication Sig Dispense Refill   acetaminophen (TYLENOL) 325 MG tablet Take 2 tablets (650 mg total) by mouth every 6 (six) hours as needed for mild pain (or Fever >/= 101).     apixaban (ELIQUIS) 5 MG TABS tablet Take 1 tablet (5 mg total) by mouth 2 (two) times daily. 180 tablet 1   atorvastatin (LIPITOR) 80 MG tablet Take 1 tablet (80 mg total) by mouth daily. 90 tablet 1   Continuous Blood Gluc Receiver (DEXCOM G6 RECEIVER) DEVI Use as directed. 1 each 0  furosemide (LASIX) 20 MG tablet Take 2 tablets (40 mg total) by mouth daily. 180 tablet 1   glucose blood (ACCU-CHEK GUIDE) test strip Use as instructed 3 times daily DX code E11.9 100 each 3   insulin glargine (LANTUS SOLOSTAR) 100 UNIT/ML Solostar Pen Inject 20 Units into the skin daily. 15 mL 2   Insulin Syringe-Needle U-100 (INSULIN SYRINGE .5CC/30GX1/2") 30G X 1/2" 0.5 ML MISC Check blood sugar TID & QHS 100 each 2   Lancets (ACCU-CHEK SOFT TOUCH) lancets Use as instructed 100 each 12   TRUEPLUS LANCETS 28G MISC 1 each by Does not apply route 3 (three) times daily before meals. 100 each 12   amiodarone (PACERONE) 200 MG tablet Take 1 tablet (200 mg  total) by mouth daily. 90 tablet 3   carvedilol (COREG) 12.5 MG tablet Take 1 tablet (12.5 mg total) by mouth 2 (two) times daily. Please cancel all previous orders for current medication. Change in dosage or pill size. 180 tablet 3   isosorbide-hydrALAZINE (BIDIL) 20-37.5 MG tablet Take 2 tablets by mouth 3 (three) times daily. 270 tablet 3   No current facility-administered medications for this encounter.   BP (!) 190/100 (BP Location: Right Arm, Patient Position: Sitting)   Pulse 76   Wt 74.7 kg (164 lb 9.6 oz)   SpO2 99%   BMI 25.78 kg/m    Wt Readings from Last 3 Encounters:  05/05/22 74.7 kg (164 lb 9.6 oz)  02/26/22 70.4 kg (155 lb 3.2 oz)  02/12/22 69.8 kg (153 lb 12.8 oz)   Physical Exam: General: NAD Neck: No JVD, no thyromegaly or thyroid nodule.  Lungs: Clear to auscultation bilaterally with normal respiratory effort. CV: Nondisplaced PMI.  Heart regular S1/S2, no S3/S4, 2/6 early SEM RUSB.  No peripheral edema.  No carotid bruit.  Normal pedal pulses.  Abdomen: Soft, nontender, no hepatosplenomegaly, no distention.  Skin: Intact without lesions or rashes.  Neurologic: Alert and oriented x 3.  Psych: Normal affect. Extremities: No clubbing or cyanosis.  HEENT: Normal.   Assessment/Plan: 1. Chronic systolic CHF: History of ischemic CMP in 2016 post-anterior MI that recovered over time.  Echo in 8/16 with EF 50-55%.  Echo 4/23 showed EF 40-45% with apical HK, mild LVH, normal RV, dilated IVC. NHYA II. He does not appear volume overloaded on exam.  BP is markedly elevated.  - Continues on 40 mg lasix daily.  - Increase Coreg to 12.5 mg bid.  - Increase Bidil to 2 tabs tid. - No MRA/ARNi/SGLT2i with CKD. - BMET today.  - Repeat echo at followup in 3 months.  2. CAD: Anterior STEMI in 2016, DES to LAD.  NSTEMI 4/23. Had been off all meds x 1 month. ECG and elevate HS-Tnl suggestive of NSTEMI, but no cath due to lack of chest pain and creatinine up to > 4. This was in  setting of DKA and hypertension.  No chest pain.  - Would avoid cath in absence of STEMI given CKD stage IV and no symptoms.  - Continue ASA 81 - Continue atorvastatin 80 mg daily. Check lipids today.  3. CKD stage IV: Baseline creatinine 2.7-3, now ~4. Baseline diabetic nephropathy, suspect HTN plays a role.  - Patient reports he would not want HD  - Needs better BP control, adjust meds as above.  - Follows with Nephrology (Dr. Johnney Ou) 4. DM2: H/o DKA.  He is on insulin. Per PCP. 5. H/o ETOH abuse: He denies further use.  6. Atrial fibrillation: Remote  history of PAF with CVA.  He was not anticoagulated at that time due to ETOH abuse. Had atrial fibrillation during 4/23 admission.  He is now back in NSR.  - Continue amiodarone 200 mg daily. Check LFTs, TSH today.  He will need a regular eye exam.  - Continue Eliquis.  CBC today.  7. Jehovah's Witness: no blood products. 8. Hypertension: Blood pressure elevated as above. Has not taken any meds today. - Needs to take meds regularly => he has his bottles with him today, I had him take his am meds.  - Increase Coreg and Bidil as above.  - Recommended home blood pressure monitoring.   Follow up: HF pharmacist for BP med titration in 3 wks x 2 visits, then see me in 3 months with echo.   Loralie Champagne 05/05/22

## 2022-05-11 ENCOUNTER — Other Ambulatory Visit: Payer: Self-pay

## 2022-05-11 NOTE — Progress Notes (Signed)
Advanced Heart Failure Clinic Note   PCP: Ladell Pier, MD HF: Dr. Aundra Dubin     HPI:  Timothy Lambert is a 66 y.o. male with history of prior ETOH abuse, DM, HTN, CAD s/p anterior STEMI 01/2015, paroxysmal atrial fibrillation, and CVA. Patient was admitted to [facility] in 01/2015 with an ETOH withdrawal seizure.  He was also noted to have anterior ST elevation and was sent for cath.  This showed diffuse disease with culprit being 100% mLAD stenosis.  He had DES to mLAD. Echo showed EF 30-35%.  Post-PCI, he had atrial fibrillation and had a CVA.  He had no residual deficits from the CVA.  He was managed w/ ASA and Brilinta.  He was thought to be a poor candidate for anticoagulation given history of ETOH abuse.    Repeat echo 04/2015 showed improvement in EF to 50-55%.   Admitted 12/2021 w/ acute CHF, suspected shock and DKA. Started on BiPap, IV lasix and insulin, and abx for possible PNA. Echo showed EF 40-45%. [place]nl [place] to [address], suggestive of NSTEMI with ACS. No cath with lack of chest pain and AKI (SCr >4). Declined central access and blood products. In AF this admission and started on amiodarone. Drips weaned off and GDMT started, limited by CKD. Palliative consulted, was DNR. Discharged home with [place] [facility], weight 167 lbs.   Presented to AHF Clinic for heart failure follow up 05/05/22. BP was very high in clinic, he had not taken any of his morning medications yet.  He stated that his BP is high even when he takes his meds (though not this high). No significant exertional dyspnea.  No trouble walking up a flight of stairs. No lightheadedness.  No ETOH use.  No BRBPR/melena.   Today he returns to HF clinic for pharmacist medication titration. At last visit with MD carvedilol was increased to 12.5 mg BID and Bidil 20-37.5 mg was increased to 2 tablets TID. Unfortunately he didn't make any of these changes. Stated he had not had the time to go over to the pharmacy yet. Also found that he  has not been taking amiodarone. He could not tell me why he wasn't taking it or when it was stopped; however, it was last dispensed on 03/20/22 per pharmacy records. Overall he is feeling fine today. No dizziness or lightheadedness. BP is markedly elevated in clinic at 210/92. Had patient take an extra dose of Bidil and carvedilol 6.25 mg in clinic. BP 10 minutes later was 194/100. States he has not been keeping track of his BP at home. No CP or palpitations. No SOB/DOE. Weight has been stable. Takes Lasix 40 mg daily and has not needed any extra. No LEE, PND or orthopnea.     HF Medications: Carvedilol 12.5 mg BID Bidil 20-37.5 mg 2 tablets TID Lasix 40 mg daily  Has the patient been experiencing any side effects to the medications prescribed?  no  Does the patient have any problems obtaining medications due to transportation or finances?   No; [employer] Medicare  Understanding of regimen: poor Understanding of indications: poor Potential of compliance: poor - Discussed need for increased adherence and compliance in detail today.  Patient understands to avoid NSAIDs. Patient understands to avoid decongestants.    Pertinent Lab Values: 05/05/22: Serum creatinine 3.56, BUN 47, Potassium 4.6, Sodium 137  Vital Signs: Weight: 160.8 lbs (last clinic weight: 164.6 lbs) Blood pressure: 210/92>>194/100 after extra doses of carvedilol and Bidil given.  Heart  rate: 75   Assessment/Plan: 1. Chronic systolic CHF: History of ischemic CMP in 2016 post-anterior MI that recovered over time.  Echo in 04/2015 with EF 50-55%.  Echo 12/2021 showed EF 40-45% with apical HK, mild LVH, normal RV, dilated IVC.  - NHYA II. He does not appear volume overloaded on exam.  BP is markedly elevated, did not make any changes as instructed last visit.  - Continue Lasix 40 mg daily - Increase carvedilol to 12.5 mg BID.  - Increase Bidil 20-37.5 mg to 2 tabs TID. - No MRA/ARNi/SGLT2i with CKD. - Repeat echo scheduled for  07/30/22 2. CAD: Anterior STEMI in 2016, DES to LAD.  NSTEMI 12/2021. Had been off all meds x 1 month. ECG and elevate HS-Tnl suggestive of NSTEMI, but no cath due to lack of chest pain and creatinine up to > 4. This was in setting of DKA and hypertension.  No chest pain.  - Would avoid cath in absence of STEMI given CKD stage IV and no symptoms.  - Continue ASA 81 - Continue atorvastatin 80 mg daily. LDL 71 on 05/05/22. 3. CKD stage IV: Baseline creatinine 2.7-3, now ~4. Baseline diabetic nephropathy, suspect HTN plays a role.  - Patient reports he would not want HD  - Needs better BP control, adjust meds as above.  - Follows with Nephrology (Dr. Johnney Ou) 4. DM2: H/o DKA.  He is on insulin. Per PCP. 5. H/o ETOH abuse: He denies further use.  6. Atrial fibrillation: Remote history of PAF with CVA.  He was not anticoagulated at that time due to ETOH abuse. Had atrial fibrillation during 12/2021 admission.  He is now back in NSR.  - Has been off amiodarone, unclear why or for how long but likely since at least June/July. Spoke with Dr. Aundra Dubin, will continue off amiodarone for now. Will have to restart if he goes back into AF. Removed from medication list.  - Continue Eliquis.   7. Jehovah's Witness: no blood products. 8. Hypertension: Blood pressure elevated as above. States he did take his medications today.  - Recommended home blood pressure monitoring again.  - Increase carvedilol and Bidil as above. Instructed him to take extra dose of Bidil and carvedilol in clinic today.  -Poor insight into his medical conditions and has had multiple medication errors. Dr. Aundra Dubin would like him referred to HF Paramedicine. Discussed with patient today, he declines. Will need to revisit in the future if adherence does not improve.   Follow up 2 weeks with Pharmacy Clinic   Audry Riles, PharmD, BCPS, [facility], CPP Heart Failure Clinic Pharmacist [phone]

## 2022-05-21 ENCOUNTER — Ambulatory Visit: Payer: Medicare Other | Admitting: Internal Medicine

## 2022-05-22 ENCOUNTER — Other Ambulatory Visit: Payer: Self-pay | Admitting: Internal Medicine

## 2022-05-22 ENCOUNTER — Other Ambulatory Visit: Payer: Self-pay

## 2022-05-22 DIAGNOSIS — E1165 Type 2 diabetes mellitus with hyperglycemia: Secondary | ICD-10-CM

## 2022-05-22 MED ORDER — TECHLITE PEN NEEDLES 32G X 4 MM MISC
5 refills | Status: AC
  Filled 2022-05-22: qty 100, 100d supply, fill #0

## 2022-05-26 ENCOUNTER — Ambulatory Visit: Payer: Self-pay | Admitting: Licensed Clinical Social Worker

## 2022-05-26 ENCOUNTER — Other Ambulatory Visit: Payer: Self-pay

## 2022-05-26 NOTE — Patient Outreach (Signed)
  Care Coordination   05/26/2022 Name: Timothy Lambert MRN: [id number] DOB: 19-May-1956   Care Coordination Outreach Attempts:  An unsuccessful telephone outreach was attempted today to offer the patient information about available care coordination services as a benefit of their health plan.   Follow Up Plan:  Additional outreach attempts will be made to offer the patient care coordination information and services.   Encounter Outcome:  No Answer  Care Coordination Interventions Activated:  No   Care Coordination Interventions:  No, not indicated    Christa See, MSW, [email or web address] Phone [phone] 3:00 PM

## 2022-05-27 ENCOUNTER — Ambulatory Visit (HOSPITAL_COMMUNITY)
Admission: RE | Admit: 2022-05-27 | Discharge: 2022-05-27 | Disposition: A | Discharge status: Home / Self Care | Payer: Medicare Other | Source: Ambulatory Visit | Attending: Cardiology | Admitting: Cardiology

## 2022-05-27 ENCOUNTER — Other Ambulatory Visit: Payer: Self-pay

## 2022-05-27 ENCOUNTER — Other Ambulatory Visit (HOSPITAL_COMMUNITY): Payer: Self-pay

## 2022-05-27 VITALS — BP 194/100 | HR 75 | Wt 160.8 lb

## 2022-05-27 DIAGNOSIS — I480 Paroxysmal atrial fibrillation: Secondary | ICD-10-CM | POA: Insufficient documentation

## 2022-05-27 DIAGNOSIS — I5022 Chronic systolic (congestive) heart failure: Secondary | ICD-10-CM

## 2022-05-27 DIAGNOSIS — E1122 Type 2 diabetes mellitus with diabetic chronic kidney disease: Secondary | ICD-10-CM | POA: Diagnosis not present

## 2022-05-27 DIAGNOSIS — I200 Unstable angina: Secondary | ICD-10-CM | POA: Insufficient documentation

## 2022-05-27 DIAGNOSIS — F1010 Alcohol abuse, uncomplicated: Secondary | ICD-10-CM | POA: Diagnosis not present

## 2022-05-27 DIAGNOSIS — I120 Hypertensive chronic kidney disease with stage 5 chronic kidney disease or end stage renal disease: Secondary | ICD-10-CM | POA: Diagnosis not present

## 2022-05-27 DIAGNOSIS — N185 Chronic kidney disease, stage 5: Secondary | ICD-10-CM

## 2022-05-27 DIAGNOSIS — I252 Old myocardial infarction: Secondary | ICD-10-CM | POA: Insufficient documentation

## 2022-05-27 MED ORDER — CARVEDILOL 12.5 MG PO TABS
12.5000 mg | ORAL_TABLET | Freq: Two times a day (BID) | ORAL | 3 refills | Status: DC
  Filled 2022-05-27 – 2022-06-04 (×2): qty 180, 90d supply, fill #0

## 2022-05-27 MED ORDER — ISOSORB DINITRATE-HYDRALAZINE 20-37.5 MG PO TABS
2.0000 | ORAL_TABLET | Freq: Three times a day (TID) | ORAL | 3 refills | Status: DC
  Filled 2022-05-27: qty 270, 45d supply, fill #0
  Filled 2022-06-04: qty 180, 30d supply, fill #0

## 2022-05-27 NOTE — Patient Instructions (Signed)
It was a pleasure seeing you today!  MEDICATIONS: -We are changing your medications today -Increase Bidil (isosorbide-hydralazine) to 2 tablets three times daily.  -Increase carvedilol to 12.5 mg (1 tablet) twice daily. You may take 2 tablets of the 6.25 mg tablet twice daily until you pick up the new strength.  -Call if you have questions about your medications.   NEXT APPOINTMENT: Return to clinic in 3 weeks with Pharmacy [facility].  In general, to take care of your heart failure: -Limit your fluid intake to 2 Liters (half-gallon) per day.   -Limit your salt intake to ideally 2-3 grams (2000-3000 mg) per day. -Weigh yourself daily and record, and bring that "weight diary" to your next appointment.  (Weight gain of 2-3 pounds in 1 day typically means fluid weight.) -The medications for your heart are to help your heart and help you live longer.   -Please contact us before stopping any of your heart medications.  Call the clinic at [phone] with questions or to reschedule future appointments.

## 2022-05-28 ENCOUNTER — Ambulatory Visit: Payer: Self-pay | Admitting: Licensed Clinical Social Worker

## 2022-05-29 NOTE — Patient Outreach (Signed)
  Care Coordination   Initial Visit Note   05/29/2022 Name: LOUIS IVERY MRN: [id number] DOB: 1956/04/08  ROCKEY GUARINO is a 66 y.o. year old male who sees Ladell Pier, MD for primary care. I spoke with Littie Deeds by phone today.  What matters to the patients health and wellness today?  Explanation of care coordination services    Goals Addressed             This Visit's Progress    COMPLETED: Care Coordination Services-No Follow up required       Care Coordination Interventions: Active listening / Reflection utilized  Emotional Support Provided LCSW provided information on care coordination services. Pt is not interested at this time Reviewed upcoming appts         SDOH assessments and interventions completed:  No     Care Coordination Interventions Activated:  Yes  Care Coordination Interventions:  Yes, provided   Follow up plan: No further intervention required.   Encounter Outcome:  Pt. Visit Completed   Christa See, MSW, [email or web address] Phone [phone] 1:39 PM

## 2022-05-29 NOTE — Patient Instructions (Signed)
Visit Information  Thank you for taking time to visit with me today. Please don't hesitate to contact me if I can be of assistance to you.   Following are the goals we discussed today:   Goals Addressed             This Visit's Progress    COMPLETED: Care Coordination Services-No Follow up required       Care Coordination Interventions: Active listening / Reflection utilized  Emotional Support Provided LCSW provided information on care coordination services. Pt is not interested at this time Reviewed upcoming appts         If you are experiencing a Mental Health or [place] or need someone to talk to, please call the Suicide and Crisis Lifeline: 988 call 911   Patient verbalizes understanding of instructions and care plan provided today and agrees to view in [place]. Active MyChart status and patient understanding of how to access instructions and care plan via MyChart confirmed with patient.     No further follow up required:    Christa See, MSW, [email or web address] Phone [phone] 1:40 PM

## 2022-06-03 ENCOUNTER — Other Ambulatory Visit: Payer: Self-pay

## 2022-06-04 ENCOUNTER — Ambulatory Visit: Payer: Medicare Other | Admitting: Podiatry

## 2022-06-04 ENCOUNTER — Other Ambulatory Visit: Payer: Self-pay

## 2022-06-04 DIAGNOSIS — D631 Anemia in chronic kidney disease: Secondary | ICD-10-CM | POA: Diagnosis not present

## 2022-06-04 DIAGNOSIS — I129 Hypertensive chronic kidney disease with stage 1 through stage 4 chronic kidney disease, or unspecified chronic kidney disease: Secondary | ICD-10-CM | POA: Diagnosis not present

## 2022-06-04 DIAGNOSIS — E1122 Type 2 diabetes mellitus with diabetic chronic kidney disease: Secondary | ICD-10-CM | POA: Diagnosis not present

## 2022-06-04 DIAGNOSIS — I5042 Chronic combined systolic (congestive) and diastolic (congestive) heart failure: Secondary | ICD-10-CM | POA: Diagnosis not present

## 2022-06-04 DIAGNOSIS — E785 Hyperlipidemia, unspecified: Secondary | ICD-10-CM | POA: Diagnosis not present

## 2022-06-04 DIAGNOSIS — N184 Chronic kidney disease, stage 4 (severe): Secondary | ICD-10-CM | POA: Diagnosis not present

## 2022-06-04 DIAGNOSIS — R809 Proteinuria, unspecified: Secondary | ICD-10-CM | POA: Diagnosis not present

## 2022-06-05 ENCOUNTER — Other Ambulatory Visit: Payer: Self-pay

## 2022-06-12 ENCOUNTER — Other Ambulatory Visit: Payer: Self-pay

## 2022-06-16 ENCOUNTER — Inpatient Hospital Stay (HOSPITAL_COMMUNITY): Admission: RE | Admit: 2022-06-16 | Payer: Medicare Other | Source: Ambulatory Visit

## 2022-07-22 ENCOUNTER — Telehealth (HOSPITAL_COMMUNITY): Payer: Self-pay | Admitting: *Deleted

## 2022-07-22 NOTE — Telephone Encounter (Signed)
Pts wife called requesting an update on paperwork for [employer].  Routed to [employer] and [employer]

## 2022-07-30 ENCOUNTER — Ambulatory Visit: Payer: Medicare Other | Attending: Internal Medicine | Admitting: Internal Medicine

## 2022-07-30 ENCOUNTER — Encounter: Payer: Self-pay | Admitting: Internal Medicine

## 2022-07-30 ENCOUNTER — Encounter (HOSPITAL_COMMUNITY): Payer: Self-pay | Admitting: Cardiology

## 2022-07-30 ENCOUNTER — Ambulatory Visit (HOSPITAL_COMMUNITY)
Admission: RE | Admit: 2022-07-30 | Discharge: 2022-07-30 | Disposition: A | Discharge status: Home / Self Care | Payer: Medicare Other | Source: Ambulatory Visit | Attending: Cardiology | Admitting: Cardiology

## 2022-07-30 ENCOUNTER — Other Ambulatory Visit: Payer: Self-pay

## 2022-07-30 ENCOUNTER — Ambulatory Visit (HOSPITAL_BASED_OUTPATIENT_CLINIC_OR_DEPARTMENT_OTHER)
Admission: RE | Admit: 2022-07-30 | Discharge: 2022-07-30 | Disposition: A | Discharge status: Home / Self Care | Payer: Medicare Other | Source: Ambulatory Visit | Attending: Cardiology | Admitting: Cardiology

## 2022-07-30 VITALS — BP 200/100 | HR 86 | Temp 98.0°F | Ht 67.0 in | Wt 167.0 lb

## 2022-07-30 VITALS — BP 180/100 | HR 79 | Wt 168.0 lb

## 2022-07-30 DIAGNOSIS — I252 Old myocardial infarction: Secondary | ICD-10-CM | POA: Diagnosis not present

## 2022-07-30 DIAGNOSIS — I480 Paroxysmal atrial fibrillation: Secondary | ICD-10-CM

## 2022-07-30 DIAGNOSIS — Z7901 Long term (current) use of anticoagulants: Secondary | ICD-10-CM | POA: Insufficient documentation

## 2022-07-30 DIAGNOSIS — Z7982 Long term (current) use of aspirin: Secondary | ICD-10-CM | POA: Insufficient documentation

## 2022-07-30 DIAGNOSIS — Z8249 Family history of ischemic heart disease and other diseases of the circulatory system: Secondary | ICD-10-CM | POA: Diagnosis not present

## 2022-07-30 DIAGNOSIS — Z2821 Immunization not carried out because of patient refusal: Secondary | ICD-10-CM

## 2022-07-30 DIAGNOSIS — Z1211 Encounter for screening for malignant neoplasm of colon: Secondary | ICD-10-CM

## 2022-07-30 DIAGNOSIS — E1122 Type 2 diabetes mellitus with diabetic chronic kidney disease: Secondary | ICD-10-CM | POA: Insufficient documentation

## 2022-07-30 DIAGNOSIS — I5042 Chronic combined systolic (congestive) and diastolic (congestive) heart failure: Secondary | ICD-10-CM

## 2022-07-30 DIAGNOSIS — Z79899 Other long term (current) drug therapy: Secondary | ICD-10-CM | POA: Insufficient documentation

## 2022-07-30 DIAGNOSIS — Z955 Presence of coronary angioplasty implant and graft: Secondary | ICD-10-CM | POA: Diagnosis not present

## 2022-07-30 DIAGNOSIS — I5022 Chronic systolic (congestive) heart failure: Secondary | ICD-10-CM | POA: Diagnosis not present

## 2022-07-30 DIAGNOSIS — I2510 Atherosclerotic heart disease of native coronary artery without angina pectoris: Secondary | ICD-10-CM

## 2022-07-30 DIAGNOSIS — Z8673 Personal history of transient ischemic attack (TIA), and cerebral infarction without residual deficits: Secondary | ICD-10-CM | POA: Insufficient documentation

## 2022-07-30 DIAGNOSIS — I10 Essential (primary) hypertension: Secondary | ICD-10-CM | POA: Diagnosis not present

## 2022-07-30 DIAGNOSIS — N185 Chronic kidney disease, stage 5: Secondary | ICD-10-CM | POA: Diagnosis not present

## 2022-07-30 DIAGNOSIS — I130 Hypertensive heart and chronic kidney disease with heart failure and stage 1 through stage 4 chronic kidney disease, or unspecified chronic kidney disease: Secondary | ICD-10-CM | POA: Diagnosis not present

## 2022-07-30 DIAGNOSIS — I120 Hypertensive chronic kidney disease with stage 5 chronic kidney disease or end stage renal disease: Secondary | ICD-10-CM | POA: Diagnosis not present

## 2022-07-30 DIAGNOSIS — Z794 Long term (current) use of insulin: Secondary | ICD-10-CM

## 2022-07-30 DIAGNOSIS — I255 Ischemic cardiomyopathy: Secondary | ICD-10-CM | POA: Insufficient documentation

## 2022-07-30 DIAGNOSIS — E1121 Type 2 diabetes mellitus with diabetic nephropathy: Secondary | ICD-10-CM

## 2022-07-30 DIAGNOSIS — N184 Chronic kidney disease, stage 4 (severe): Secondary | ICD-10-CM | POA: Diagnosis not present

## 2022-07-30 LAB — ECHOCARDIOGRAM COMPLETE
AR max vel: 1.75 cm2
AV Area VTI: 1.71 cm2
AV Area mean vel: 1.58 cm2
AV Mean grad: 3 mmHg
AV Peak grad: 5.7 mmHg
Ao pk vel: 1.19 m/s
Area-P 1/2: 3.77 cm2
Calc EF: 41 %
S' Lateral: 4 cm
Single Plane A2C EF: 42 %
Single Plane A4C EF: 36.8 %

## 2022-07-30 LAB — BASIC METABOLIC PANEL
Anion gap: 7 (ref 5–15)
BUN: 36 mg/dL — ABNORMAL HIGH (ref 8–23)
CO2: 21 mmol/L — ABNORMAL LOW (ref 22–32)
Calcium: 9.1 mg/dL (ref 8.9–10.3)
Chloride: 110 mmol/L (ref 98–111)
Creatinine, Ser: 3.06 mg/dL — ABNORMAL HIGH (ref 0.61–1.24)
GFR, Estimated: 22 mL/min — ABNORMAL LOW (ref >60)
Glucose, Bld: 186 mg/dL — ABNORMAL HIGH (ref 70–99)
Potassium: 5.5 mmol/L — ABNORMAL HIGH (ref 3.5–5.1)
Sodium: 138 mmol/L (ref 135–145)

## 2022-07-30 LAB — POCT GLYCOSYLATED HEMOGLOBIN (HGB A1C): HbA1c, POC (controlled diabetic range): 10.8 % — AB (ref 0.0–7.0)

## 2022-07-30 LAB — GLUCOSE, POCT (MANUAL RESULT ENTRY): POC Glucose: 156 mg/dl — AB (ref 70–99)

## 2022-07-30 LAB — BRAIN NATRIURETIC PEPTIDE: B Natriuretic Peptide: 372.6 pg/mL — ABNORMAL HIGH (ref 0.0–100.0)

## 2022-07-30 MED ORDER — APIXABAN 5 MG PO TABS
5.0000 mg | ORAL_TABLET | Freq: Two times a day (BID) | ORAL | 1 refills | Status: DC
  Filled 2022-07-30 – 2022-12-18 (×2): qty 60, 30d supply, fill #0
  Filled 2022-12-18: qty 180, 90d supply, fill #0

## 2022-07-30 MED ORDER — ISOSORB DINITRATE-HYDRALAZINE 20-37.5 MG PO TABS
2.0000 | ORAL_TABLET | Freq: Three times a day (TID) | ORAL | 3 refills | Status: DC
  Filled 2022-07-30: qty 180, 30d supply, fill #0

## 2022-07-30 MED ORDER — FUROSEMIDE 20 MG PO TABS
40.0000 mg | ORAL_TABLET | ORAL | 1 refills | Status: DC | PRN
  Filled 2022-07-30 – 2022-12-18 (×2): qty 180, 90d supply, fill #0

## 2022-07-30 MED ORDER — FREESTYLE LIBRE SENSOR SYSTEM MISC
12 refills | Status: DC
  Filled 2022-07-30: qty 2, 28d supply, fill #0

## 2022-07-30 MED ORDER — FREESTYLE LIBRE READER DEVI
0 refills | Status: DC
  Filled 2022-07-30: qty 1, 30d supply, fill #0

## 2022-07-30 MED ORDER — INSULIN LISPRO (1 UNIT DIAL) 100 UNIT/ML (KWIKPEN)
2.0000 [IU] | PEN_INJECTOR | Freq: Two times a day (BID) | SUBCUTANEOUS | 11 refills | Status: DC
  Filled 2022-07-30: qty 3, 28d supply, fill #0

## 2022-07-30 MED ORDER — LANTUS SOLOSTAR 100 UNIT/ML ~~LOC~~ SOPN: 22.0000 [IU] | PEN_INJECTOR | Freq: Every day | SUBCUTANEOUS | 2 refills | Status: DC

## 2022-07-30 MED ORDER — CARVEDILOL 12.5 MG PO TABS
18.7500 mg | ORAL_TABLET | Freq: Two times a day (BID) | ORAL | 3 refills | Status: DC
  Filled 2022-07-30 – 2022-12-18 (×2): qty 180, 60d supply, fill #0

## 2022-07-30 NOTE — Progress Notes (Signed)
*  PRELIMINARY RESULTS* Echocardiogram 2D Echocardiogram has been performed.  Timothy Lambert 07/30/2022, 10:48 AM

## 2022-07-30 NOTE — Progress Notes (Signed)
Patient ID: Timothy Lambert, male    DOB: [birth year]  MRN: [id number]  CC: Hypertension (HTN & DM f/u. Orion Crook dexacom/No to flu vax. )   Subjective: Timothy Lambert is a 66 y.o. male who presents for chronic ds management His concerns today include:  Pt with hx of DM with retinopathy and nephropathy, HTN, HL, CAD (with DES LAD 2016, NSTEMI 02/7590), embolic CVA, PAF (thought to be a poor candidate for anticoagulation because of history of EtOH abuse at the time of his CVA in 2016 ) ETOH withdrawal sz, CKD stage 3, macroalbuminuria.    HM:  declines flu shot and RSV vaccine.  Declines Shingrix.    CAD/CHF systolic/PAF: Compliant with  carvedilol, furosemide 40 mg daily, Eliquis 5 mg twice a day, BiDil 20/37 2 tab TID.   Did not take his second dose of BiDil as yet for today because he had 2 appointments including cardiology today.  He tells me that amiodarone was discontinued by Dr. Marigene Ehlers today. -Denies any chest pains, shortness of breath, lower extremity edema or palpitations. Denies any bruising or bleeding on the Eliquis.  DM Results for orders placed or performed in visit on 07/30/22  POCT glucose (manual entry)  Result Value Ref Range   POC Glucose 156 (A) 70 - 99 mg/dl  POCT glycosylated hemoglobin (Hb A1C)  Result Value Ref Range   Hemoglobin A1C     HbA1c POC (<> result, manual entry)     HbA1c, POC (prediabetic range)     HbA1c, POC (controlled diabetic range) 10.8 (A) 0.0 - 7.0 %  -reports compliance with Lantus 20 units daily in a.m  Checks BS once a wk with range 160-180  Rxn sent for Dexcom on last visit.  Pt states he was called and was told it was being sent but he never received it  Not eating as much.  Snacks on fruits referred for eye exam 01/2022.  Reports he was called by Dr. Zenia Resides office but he declined.  Not wanting to have done   CKD 4-5/HTN: Not checking BP but does have device at home He tries to limit salt in the foods.  Reports seeing the  nephrologist about 2 months ago and has another appointment coming up next month. -Reports compliance with his blood pressure medications as listed above.  Carvedilol was increased to 12.5 mg 1-1/2 tablets twice a day by his cardiologist today  Patient Active Problem List   Diagnosis Date Noted   Anemia due to stage 5 chronic kidney disease, not on chronic dialysis ([place]) 02/12/2022   Hypertension associated with stage 5 chronic kidney disease due to type 2 diabetes mellitus ([place]) 02/12/2022   Anemia 01/22/2022   Lobar pneumonia ([place]) 01/21/2022   Alcohol abuse 01/21/2022   Acute on chronic systolic CHF (congestive heart failure) ([place]) 01/16/2022   Influenza vaccine refused 09/09/2020   Type 2 diabetes mellitus with hyperlipidemia ([place]) 06/29/2020   Acute kidney injury superimposed on chronic kidney disease ([place]) 02/15/2018   Macroalbuminuric diabetic nephropathy ([place]) 02/15/2018   Diabetic retinopathy ([place]) 11/18/2015   Paroxysmal atrial fibrillation ([place]) 10/28/2015   Cognitive deficit, post-stroke 05/13/2015   Essential hypertension 63/84/6659   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     atorvastatin (LIPITOR) 80 MG tablet Take 1 tablet (80 mg total) by mouth daily. 90 tablet 1   carvedilol (COREG) 12.5 MG tablet Take 1.5 tablets (18.75 mg total) by mouth 2 (two) times daily. 180 tablet 3   furosemide (LASIX) 20 MG tablet Take 2 tablets (40 mg total) by mouth as needed. For swelling or weight gain of 3lb in 24 hours or 5lb in a week 180 tablet 1   glucose blood  (ACCU-CHEK GUIDE) test strip Use as instructed 3 times daily DX code E11.9 100 each 3   Insulin Pen Needle (TECHLITE PEN NEEDLES) 32G X 4 MM MISC Use as directed for insulin 100 each 5   Insulin Syringe-Needle U-100 (INSULIN SYRINGE .5CC/30GX1/2") 30G X 1/2" 0.5 ML MISC Check blood sugar TID & QHS 100 each 2   isosorbide-hydrALAZINE (BIDIL) 20-37.5 MG tablet Take 2 tablets by mouth 3 (three) times daily. 270 tablet 3   Lancets (ACCU-CHEK SOFT TOUCH) lancets Use as instructed 100 each 12   TRUEPLUS LANCETS 28G MISC 1 each by Does not apply route 3 (three) times daily before meals. 100 each 12   No current facility-administered medications on file prior to visit.    Allergies  Allergen Reactions   Trulicity [Dulaglutide]     Upset his stomach    Social History   Socioeconomic History   Marital status: Married    Spouse name: Not on file   Number of children: Not on file   Years of education: Not on file   Highest education level: Not on file  Occupational History   Not on file  Tobacco Use   Smoking status: Former    Packs/day: 0.33    Years: 14.00    Total pack years: 4.62    Types: Cigarettes   Smokeless tobacco: Never   Tobacco comments:    "quit smoking cigarettes in 1992"  Substance and Sexual Activity   Alcohol use: No    Comment: 02/19/2015 "stopped drinking 01/22/2015"   Drug use: No   Sexual activity: Not Currently  Other Topics Concern   Not on file  Social History Narrative   Lives   Caffeine use: Coffee sometimes   Social Determinants of Health   Financial Resource Strain: Not on file  Food Insecurity: Not on file  Transportation Needs: Not on file  Physical Activity: Not on file  Stress: Not on file  Social Connections: Not on file  Intimate Partner Violence: Not on file    Family History  Problem Relation Age of Onset   Diabetes Father    Seizures Neg Hx     Past Surgical History:  Procedure Laterality Date   CARDIAC CATHETERIZATION N/A  01/23/2015   Procedure: Left Heart Cath and Coronary Angiography;  Surgeon: [name], MD;  Location: [facility];  Service: Cardiovascular;  Laterality: N/A;   CARDIAC CATHETERIZATION N/A 01/23/2015   Procedure: Coronary Stent Intervention;  Surgeon: [name], MD;  Location: [facility]VASIVE CV LAB [facility]UP[facility];  Service: Cardiovascular;  Laterality: N/A;    ROS: Review of Systems Negative except as stated above  PHYSICAL EXAM: BP (!) 200/100   Pulse 86   Temp 98 F (36.7 C) (Oral)   Ht 5\' 7"  (1.702 m)   Wt 167 lb (75.8 kg)   SpO2  100%   BMI 26.16 kg/m   Wt Readings from Last 3 Encounters:  07/30/22 167 lb (75.8 kg)  07/30/22 168 lb (76.2 kg)  05/27/22 160 lb 12.8 oz (72.9 kg)    Physical Exam   General appearance - alert, well appearing, and in no distress Mental status - normal mood, behavior, speech, dress, motor activity, and thought processes Neck - supple, no significant adenopathy Chest - clear to auscultation, no wheezes, rales or rhonchi, symmetric air entry Heart - normal rate, regular rhythm, normal S1, S2, no murmurs, rubs, clicks or gallops Extremities - peripheral pulses normal, no pedal edema, no clubbing or cyanosis     Latest Ref Rng & Units 07/30/2022   11:34 AM 05/05/2022    9:40 AM 02/26/2022    8:53 AM  CMP  Glucose 70 - 99 mg/dL 186  227  159   BUN 8 - 23 mg/dL 36  47  65   Creatinine 0.61 - 1.24 mg/dL 3.06  3.56  4.08   Sodium 135 - 145 mmol/L 138  137  141   Potassium 3.5 - 5.1 mmol/L 5.5  4.6  4.9   Chloride 98 - 111 mmol/L 110  105  107   CO2 22 - 32 mmol/L 21  24  24    Calcium 8.9 - 10.3 mg/dL 9.1  9.0  9.4   Total Protein 6.5 - 8.1 g/dL  6.8  6.7   Total Bilirubin 0.3 - 1.2 mg/dL  0.5  0.5   Alkaline Phos 38 - 126 U/L  97  82   AST 15 - 41 U/L  18  30   ALT 0 - 44 U/L  20  38    Lipid Panel     Component Value Date/Time   CHOL 126 05/05/2022 0940   CHOL 122 09/05/2021 1129   TRIG 64 05/05/2022 0940   HDL 42 05/05/2022 0940    HDL 33 (L) 09/05/2021 1129   CHOLHDL 3.0 05/05/2022 0940   VLDL 13 05/05/2022 0940   LDLCALC 71 05/05/2022 0940   LDLCALC 68 09/05/2021 1129    CBC    Component Value Date/Time   WBC 6.2 05/05/2022 0940   RBC 3.94 (L) 05/05/2022 0940   HGB 11.8 (L) 05/05/2022 0940   HGB 12.9 (L) 09/05/2021 1129   HCT 35.3 (L) 05/05/2022 0940   HCT 39.0 09/05/2021 1129   PLT 152 05/05/2022 0940   PLT 184 09/05/2021 1129   MCV 89.6 05/05/2022 0940   MCV 89 09/05/2021 1129   MCH 29.9 05/05/2022 0940   MCHC 33.4 05/05/2022 0940   RDW 13.7 05/05/2022 0940   RDW 13.2 09/05/2021 1129   LYMPHSABS 1.7 02/19/2015 2333   MONOABS 0.5 02/19/2015 2333   EOSABS 0.6 02/19/2015 2333   BASOSABS 0.0 02/19/2015 2333    ASSESSMENT AND PLAN: 1. Type 2 diabetes mellitus with diabetic nephropathy, with long-term current use of insulin ([place]) Not at goal. We discussed sending a prescription for the libre device to our pharmacy to see if he can get it today before he leaves. Increase Lantus insulin to 22 units daily with goal for morning blood sugars to be between 90-130.  Add Humalog insulin 2 units with his 2 largest meals of the day which he states is usually dinner and lunch. Discussed the importance of getting regular eye exams especially given his history of diabetic retinopathy.  I have been encouraging him to do so on numerous visits.  He does not seem interested  at this time. - POCT glucose (manual entry) - POCT glycosylated hemoglobin (Hb A1C) - insulin lispro (HUMALOG KWIKPEN) 100 UNIT/ML KwikPen; Inject 2 Units into the skin 2 (two) times daily before lunch and supper.  Dispense: 15 mL; Refill: 11 - Continuous Blood Gluc Sensor (FREESTYLE LIBRE SENSOR SYSTEM) MISC; Change sensor every 14 (fourteen) days.  Dispense: 2 each; Refill: 12 - Continuous Blood Gluc Receiver (FREESTYLE LIBRE READER) DEVI; use as directed.  Dispense: 1 each; Refill: 0 - insulin glargine (LANTUS SOLOSTAR) 100 UNIT/ML Solostar Pen;  Inject 22 Units into the skin daily.  Dispense: 15 mL; Refill: 2  2. Malignant hypertension Chronically uncontrolled.  Stressed the importance of taking his medicines every day which he states he does.  He will take his second dose of BiDil for today when he returns home.  Carvedilol just increased today by the cardiologist.  He has an appointment with nephrology next month though blood pressure will be rechecked at that time.  3. Chronic combined systolic and diastolic congestive heart failure (HCC) Compensated.  Continue furosemide, carvedilol, BiDil  4. Coronary artery disease involving native coronary artery of native heart without angina pectoris Stable.  Continue atorvastatin and carvedilol  5. PAF (paroxysmal atrial fibrillation) (HCC) - apixaban (ELIQUIS) 5 MG TABS tablet; Take 1 tablet (5 mg total) by mouth 2 (two) times daily.  Dispense: 180 tablet; Refill: 1  6. Influenza vaccination declined   7. Screening for colon cancer pt agreeable to doing Cologuard test. - Cologuard     Patient was given the opportunity to ask questions.  Patient verbalized understanding of the plan and was able to repeat key elements of the plan.   This documentation was completed using Radio producer.  Any transcriptional errors are unintentional.  Orders Placed This Encounter  Procedures   Cologuard   POCT glucose (manual entry)   POCT glycosylated hemoglobin (Hb A1C)     Requested Prescriptions   Signed Prescriptions Disp Refills   insulin lispro (HUMALOG KWIKPEN) 100 UNIT/ML KwikPen 15 mL 11    Sig: Inject 2 Units into the skin 2 (two) times daily before lunch and supper.   Continuous Blood Gluc Sensor (FREESTYLE LIBRE SENSOR SYSTEM) MISC 2 each 12    Sig: Change sensor every 14 (fourteen) days.   Continuous Blood Gluc Receiver (FREESTYLE LIBRE READER) DEVI 1 each 0    Sig: use as directed.   apixaban (ELIQUIS) 5 MG TABS tablet 180 tablet 1    Sig: Take 1  tablet (5 mg total) by mouth 2 (two) times daily.   insulin glargine (LANTUS SOLOSTAR) 100 UNIT/ML Solostar Pen 15 mL 2    Sig: Inject 22 Units into the skin daily.    Return in about 4 months (around 11/28/2022) for Appt with [facility] in 4 wks for BP check.  Karle Plumber, MD, FACP

## 2022-07-30 NOTE — Patient Instructions (Signed)
INCREASE Carvedilol to 18.75 mg (1 1/2 Tab) Twice daily  RESTART your Bidil and Eliquis  CHANGE Lasix to as needed for weight gain of 3lb in 24 hours or 5lb in a week.  Labs done today, your results will be available in MyChart, we will contact you for abnormal readings.  Your physician recommends that you schedule a follow-up appointment in: 1 month  If you have any questions or concerns before your next appointment please send [place] a message through [place] or call our office at [phone].    TO LEAVE A MESSAGE FOR THE NURSE SELECT OPTION 2, PLEASE LEAVE A MESSAGE INCLUDING: YOUR NAME DATE OF BIRTH CALL BACK NUMBER REASON FOR CALL**this is important as we prioritize the call backs  YOU WILL RECEIVE A CALL BACK THE SAME DAY AS LONG AS YOU CALL BEFORE 4:00 PM  At the [facility], you and your health needs are our priority. As part of our continuing mission to provide you with exceptional heart care, we have created designated Provider Care Teams. These Care Teams include your primary Cardiologist (physician) and Advanced Practice Providers (APPs- Physician Assistants and Nurse Practitioners) who all work together to provide you with the care you need, when you need it.   You may see any of the following providers on your designated Care Team at your next follow up: Dr Glori Bickers Dr Loralie Champagne Dr. Roxana Hires, NP Lyda Jester, [place] [facility] [place], [place] Forestine Na, NP Audry Riles, PharmD   Please be sure to bring in all your medications bottles to every appointment.

## 2022-07-30 NOTE — Progress Notes (Signed)
Patient ID: Timothy Lambert, male   DOB: 1955/09/23, 66 y.o.   MRN: [id number]    Advanced Heart Failure Clinic Note   PCP: Timothy Pier, MD HF: Dr. Patrici Ranks Lambert is a 66 y.o. male with history of prior ETOH abuse, DM, HTN, CAD s/p anterior STEMI 5/16, paroxysmal atrial fibrillation, and CVA. Patient was admitted to [facility] in 5/16 with an ETOH withdrawal seizure.  He was also noted to have anterior ST elevation and was sent for cath.  This showed diffuse disease with culprit being 100% mLAD stenosis.  He had DES to mLAD. Echo showed EF 30-35%.  Post-PCI, he had atrial fibrillation and had a CVA.  He had no residual deficits from the CVA.  He was managed w/ ASA and Brilinta.  He was thought to be a poor candidate for anticoagulation given history of ETOH abuse.    Repeat echo 8/16 showed improvement in EF to 50-55%.   Admitted 4/23 w/ acute CHF, suspected shock and DKA. Started on BiPap, IV lasix and insulin, and abx for possible PNA. Echo showed EF 40-45%. [place]vated [place]nl up to 138[address], suggestive of NSTEMI with ACS. No cath with lack of chest pain and AKI (SCr >4). Declined central access and blood products. In AF this admission and started on amiodarone. Drips weaned off and GDMT started, limited by CKD. Palliative consulted, was DNR. Discharged home with [facility], weight 167 lbs.  Echo was done today and reviewed, EF 40% with mild LV enlargement, mild LVH, global hypokinesis, normal RV.   Patient returns for CHF followup.  He has been out of Eliquis and Bidil.  He has not been taking Lasix.  BP is very high today.  He reports excellent exercise tolerance.  He is able to do yardwork like mowing and raking with no problems.  No significant exertional dyspnea or chest pain.  No lightheadedness. He has been off amiodarone for a while now.  Weight up 4 lbs.   Labs (6/23): BNP 182, K 4.9, creatinine 4.08, LFTs normal, TSH normal Labs (8/23): LDL 71, hgb 11.8, K 4.6, creatinine 3.56,  TSH normal, LFTs normal  ECG (personally reviewed): NSR, LVH with repolarization abnormality, inferior Qs  PMH: 1. Type II diabetes 2. HTN 3. H/o heavy ETOH abuse: Has now quit. H/o withdrawal seizure. 4. GERD 5. CVA 02/19/15: Related to atrial fibrillation.  6. CKD stage 4: AKI with ACEI 7. CAD: Presented to [facility] with anterior STEMI 5/16.  LHC with 50% LM, 99% mLAD, 90% D1, 75% ramus, 70% mRCA.  Patient had DES to mLAD.   8. Ischemic cardiomyopathy: Echo (5/16) with EF 30-35% with wall motion abnormalities, normal RV size and systolic function. Echo (8/16) with EF 50-55%, mild mid-apical anteroseptal HK.  - Echo (4/23): EF 40-45% - Echo (11/23): EF 40% with mild LV enlargement, mild LVH, global hypokinesis, normal RV. 9. Atrial fibrillation: Paroxysmal.  Noted after STEMI in 5/16.  Initially not anticoagulated due to need for ticagrelor and concern over ETOH abuse.   10. Carotid dopplers (8/16) with minimal carotid disease.   SH: Married, prior heavy ETOH (has quit).  Prior smoker (has quit).  Lives in [place].  Jehovah's Witness.   FH: CAD  ROS: All systems reviewed and negative except as per HPI.   Current Outpatient Medications  Medication Sig Dispense Refill   acetaminophen (TYLENOL) 325 MG tablet Take 2 tablets (650 mg total) by mouth every 6 (six) hours as needed for mild pain (or  Fever >/= 101).     glucose blood (ACCU-CHEK GUIDE) test strip Use as instructed 3 times daily DX code E11.9 100 each 3   Insulin Pen Needle (TECHLITE PEN NEEDLES) 32G X 4 MM MISC Use as directed for insulin 100 each 5   Insulin Syringe-Needle U-100 (INSULIN SYRINGE .5CC/30GX1/2") 30G X 1/2" 0.5 ML MISC Check blood sugar TID & QHS 100 each 2   Lancets (ACCU-CHEK SOFT TOUCH) lancets Use as instructed 100 each 12   TRUEPLUS LANCETS 28G MISC 1 each by Does not apply route 3 (three) times daily before meals. 100 each 12   apixaban (ELIQUIS) 5 MG TABS tablet Take 1 tablet (5 mg total) by mouth 2 (two)  times daily. 180 tablet 1   atorvastatin (LIPITOR) 80 MG tablet Take 1 tablet (80 mg total) by mouth daily. 90 tablet 1   carvedilol (COREG) 12.5 MG tablet Take 1.5 tablets (18.75 mg total) by mouth 2 (two) times daily. 180 tablet 3   Continuous Blood Gluc Receiver (FREESTYLE LIBRE READER) DEVI use as directed. 1 each 0   Continuous Blood Gluc Sensor (FREESTYLE LIBRE SENSOR SYSTEM) MISC Change sensor every 14 (fourteen) days. 2 each 12   furosemide (LASIX) 20 MG tablet Take 2 tablets (40 mg total) by mouth as needed. For swelling or weight gain of 3lb in 24 hours or 5lb in a week 180 tablet 1   insulin glargine (LANTUS SOLOSTAR) 100 UNIT/ML Solostar Pen Inject 22 Units into the skin daily. 15 mL 2   insulin lispro (HUMALOG KWIKPEN) 100 UNIT/ML KwikPen Inject 2 Units into the skin 2 (two) times daily before lunch and supper. 15 mL 11   isosorbide-hydrALAZINE (BIDIL) 20-37.5 MG tablet Take 2 tablets by mouth 3 (three) times daily. 270 tablet 3   No current facility-administered medications for this encounter.   BP (!) 180/100   Pulse 79   Wt 76.2 kg (168 lb)   SpO2 98%   BMI 26.31 kg/m    Wt Readings from Last 3 Encounters:  07/30/22 75.8 kg (167 lb)  07/30/22 76.2 kg (168 lb)  05/27/22 72.9 kg (160 lb 12.8 oz)   General: NAD Neck: No JVD, no thyromegaly or thyroid nodule.  Lungs: Clear to auscultation bilaterally with normal respiratory effort. CV: Nondisplaced PMI.  Heart regular S1/S2, no S3/S4, no murmur.  No peripheral edema.  No carotid bruit.  Normal pedal pulses.  Abdomen: Soft, nontender, no hepatosplenomegaly, no distention.  Skin: Intact without lesions or rashes.  Neurologic: Alert and oriented x 3.  Psych: Normal affect. Extremities: No clubbing or cyanosis.  HEENT: Normal.   Assessment/Plan: 1. Chronic systolic CHF: History of ischemic CMP in 2016 post-anterior MI that recovered over time.  Echo in 8/16 with EF 50-55%.  Echo 4/23 showed EF 40-45% with apical HK, mild  LVH, normal RV, dilated IVC. NHYA II. Echo today showed EF 40% with mild LV enlargement, mild LVH, global hypokinesis, normal RV.  He does not appear volume overloaded on exam, NYHA class II.  BP is markedly elevated but he has been out of Bidil.  - He has not needed Lasix, can stay off.  - Increase Coreg to 18.75 mg bid.  - Restart Bidil 2 tabs tid. - No MRA/ARNi/SGLT2i with CKD. - BMET/BNP today.  2. CAD: Anterior STEMI in 2016, DES to LAD.  NSTEMI 4/23. Had been off all meds x 1 month. ECG and elevate HS-Tnl suggestive of NSTEMI, but no cath due to lack of chest pain  and creatinine up to > 4. This was in setting of DKA and hypertension.  No chest pain.  - Would avoid cath in absence of STEMI given CKD stage IV..  - Continue ASA 81 - Continue atorvastatin 80 mg daily. Lipids ok in 8/23.  3. CKD stage IV: Baseline diabetic nephropathy, suspect HTN plays a role.  - Patient reports he would not want HD  - Needs better BP control, adjust meds as above.  - Follows with Nephrology (Dr. Johnney Ou) 4. DM2: H/o DKA.  He is on insulin. Per PCP. 5. H/o ETOH abuse: He denies further use.  6. Atrial fibrillation: Remote history of PAF with CVA.  He was not anticoagulated at that time due to ETOH abuse. Had atrial fibrillation during 4/23 admission.  He is now back in NSR.  - He is off amiodarone, can stay off unless AF recurs.  - Restart Eliquis.   7. Jehovah's Witness: no blood products. 8. Hypertension: Blood pressure elevated as above. He has been off Bidil.   - Increase Coreg and restart Bidil as above.   Follow up: 1 month APP  Loralie Champagne 07/30/22

## 2022-07-30 NOTE — Progress Notes (Signed)
Medication Samples have been provided to the patient.  Drug name: Eliquis       Strength: 5 mg        Qty: 4  LOT: [id number]  Exp.Date: 01/2024  Dosing instructions: Take 1 tablet Twice daily   The patient has been instructed regarding the correct time, dose, and frequency of taking this medication, including desired effects and most common side effects.   Timothy Lambert 11:33 AM 07/30/2022

## 2022-07-31 ENCOUNTER — Other Ambulatory Visit: Payer: Self-pay

## 2022-07-31 ENCOUNTER — Other Ambulatory Visit: Payer: Self-pay | Admitting: Pharmacist

## 2022-07-31 DIAGNOSIS — Z794 Long term (current) use of insulin: Secondary | ICD-10-CM

## 2022-07-31 MED ORDER — FREESTYLE LIBRE 2 READER DEVI
0 refills | Status: DC
  Filled 2022-07-31 – 2022-08-03 (×2): qty 1, 30d supply, fill #0

## 2022-07-31 MED ORDER — FREESTYLE LIBRE 2 SENSOR MISC
3 refills | Status: DC
  Filled 2022-07-31: qty 2, 28d supply, fill #0

## 2022-08-03 ENCOUNTER — Other Ambulatory Visit: Payer: Self-pay

## 2022-08-03 ENCOUNTER — Telehealth (HOSPITAL_COMMUNITY): Payer: Self-pay

## 2022-08-03 NOTE — Telephone Encounter (Signed)
FMLA paper work faxed on 08/03/22 Spouse aware

## 2022-08-04 ENCOUNTER — Encounter (HOSPITAL_COMMUNITY): Payer: Self-pay

## 2022-08-07 ENCOUNTER — Other Ambulatory Visit: Payer: Self-pay

## 2022-08-31 ENCOUNTER — Encounter (HOSPITAL_COMMUNITY): Payer: Medicare Other

## 2022-09-04 ENCOUNTER — Ambulatory Visit: Payer: Medicare Other | Attending: Internal Medicine

## 2022-09-04 DIAGNOSIS — Z0000 Encounter for general adult medical examination without abnormal findings: Secondary | ICD-10-CM

## 2022-09-04 NOTE — Progress Notes (Cosign Needed)
Subjective:   Timothy Lambert is a 66 y.o. male who presents for Medicare Annual/Subsequent preventive examination.  Review of Systems    connected with  Mr.Mccranie on  09/04/22 at  11:10 am by telephone and verified that I am speaking with the correct person using two identifiers. I discussed the limitations, risks, security and privacy concerns of performing an evaluation and management service by telephone and the availability of in person appointments. I also discussed with the patient that there may be a patient responsible charge related to this service. The patient expressed understanding and agreed to proceed.  Patient location: Home   [facility]  Persons on the telephone call:   MyselfStage manager) and Mr.Catino       Objective:    There were no vitals filed for this visit. There is no height or weight on file to calculate BMI.     09/04/2022   11:13 AM 01/19/2022    8:00 AM 01/15/2022   10:27 PM 06/04/2021   10:19 AM 05/28/2020    4:10 PM 05/17/2020    5:29 PM 11/16/2017   12:07 AM  Advanced Directives  Does Patient Have a Medical Advance Directive? Yes  Yes Yes Yes Yes No  Type of Corporate treasurer of [place];Living will [place];Living will [place];Living will [place] will   Does patient want to make changes to medical advance directive?  No - Patient declined  No - Patient declined  No - Patient declined   Copy of [place] in Chart?   No - copy requested, Physician notified No - copy requested No - copy requested    Would patient like information on creating a medical advance directive?      No - Patient declined No - Patient declined    Current Medications (verified) Outpatient Encounter Medications as of 09/04/2022  Medication Sig   acetaminophen (TYLENOL) 325 MG tablet Take 2 tablets (650 mg total) by mouth every 6 (six)  hours as needed for mild pain (or Fever >/= 101).   apixaban (ELIQUIS) 5 MG TABS tablet Take 1 tablet (5 mg total) by mouth 2 (two) times daily.   atorvastatin (LIPITOR) 80 MG tablet Take 1 tablet (80 mg total) by mouth daily.   carvedilol (COREG) 12.5 MG tablet Take 1.5 tablets (18.75 mg total) by mouth 2 (two) times daily.   insulin glargine (LANTUS SOLOSTAR) 100 UNIT/ML Solostar Pen Inject 22 Units into the skin daily.   Insulin Pen Needle (TECHLITE PEN NEEDLES) 32G X 4 MM MISC Use as directed for insulin   Insulin Syringe-Needle U-100 (INSULIN SYRINGE .5CC/30GX1/2") 30G X 1/2" 0.5 ML MISC Check blood sugar TID & QHS   Lancets (ACCU-CHEK SOFT TOUCH) lancets Use as instructed   TRUEPLUS LANCETS 28G MISC 1 each by Does not apply route 3 (three) times daily before meals.   Continuous Blood Gluc Receiver (FREESTYLE LIBRE 2 READER) DEVI Use to check blood sugar three times daily.   Continuous Blood Gluc Sensor (FREESTYLE LIBRE 2 SENSOR) MISC Change sensor every 14 (fourteen) days.   furosemide (LASIX) 20 MG tablet Take 2 tablets (40 mg total) by mouth as needed. For swelling or weight gain of 3lb in 24 hours or 5lb in a week (Patient not taking: Reported on 09/04/2022)   glucose blood (ACCU-CHEK GUIDE) test strip Use as instructed 3 times daily DX code E11.9   insulin  lispro (HUMALOG KWIKPEN) 100 UNIT/ML KwikPen Inject 2 Units into the skin 2 (two) times daily before lunch and supper. (Patient not taking: Reported on 09/04/2022)   isosorbide-hydrALAZINE (BIDIL) 20-37.5 MG tablet Take 2 tablets by mouth 3 (three) times daily.   No facility-administered encounter medications on file as of 09/04/2022.    Allergies (verified) Trulicity [dulaglutide]   History: Past Medical History:  Diagnosis Date   Acute renal failure (ARF) ([facility]) 02/19/2015   CHF (congestive heart failure) ([facility])    Coronary artery disease    GERD (gastroesophageal reflux disease)    HLD (hyperlipidemia)    Hypertension     Myocardial infarction ([place]) 01/22/2015   "massive"   Refusal of blood transfusions as patient is Jehovah's Witness    Seizures ([place]) 01/22/2015   "in front of paramedics"   Stroke ([place]) 01/22/2015   "short term memory loss & right side weak since" (02/19/2015)   Type II diabetes mellitus ([place])    Past Surgical History:  Procedure Laterality Date   CARDIAC CATHETERIZATION N/A 01/23/2015   Procedure: Left Heart Cath and Coronary Angiography;  Surgeon: [name], MD;  Location: [facility] INVASIVE CV LAB CUPID;  Service: Cardiovascular;  Laterality: N/A;   CARDIAC CATHETERIZATION N/A 01/23/2015   Procedure: Coronary Stent Intervention;  Surgeon: [name], MD;  Location: [facility] INVASIVE CV LAB CUPID;  Service: Cardiovascular;  Laterality: N/A;   Family History  Problem Relation Age of Onset   Diabetes Father    Seizures Neg Hx    Social History   Socioeconomic History   Marital status: Married    Spouse name: Not on file   Number of children: Not on file   Years of education: Not on file   Highest education level: Not on file  Occupational History   Not on file  Tobacco Use   Smoking status: Former    Packs/day: 0.33    Years: 14.00    Total pack years: 4.62    Types: Cigarettes   Smokeless tobacco: Never   Tobacco comments:    "quit smoking cigarettes in 1992"  Substance and Sexual Activity   Alcohol use: No    Comment: 02/19/2015 "stopped drinking 01/22/2015"   Drug use: No   Sexual activity: Not Currently  Other Topics Concern   Not on file  Social History Narrative   Lives   Caffeine use: Coffee sometimes   Social Determinants of Health   Financial Resource Strain: Not on file  Food Insecurity: Not on file  Transportation Needs: Not on file  Physical Activity: Not on file  Stress: Not on file  Social Connections: Not on file    Tobacco Counseling Counseling given: Not Answered Tobacco comments: "quit smoking cigarettes in 1992"   Clinical Intake:     Pain :  No/denies pain     Diabetes: Yes CBG done?: No     Diabetic?yes          Activities of Daily Living    01/16/2022    3:00 AM  In your present state of health, do you have any difficulty performing the following activities:  Hearing? 0  Vision? 0  Difficulty concentrating or making decisions? 0  Walking or climbing stairs? 0  Dressing or bathing? 0  Doing errands, shopping? 0    Patient Care Team: Ladell Pier, MD as PCP - General (Internal Medicine)  Indicate any recent Medical Services you may have received from other than [facility] providers in the past year (date may be  approximate).     Assessment:   This is a routine wellness examination for [place].  Hearing/Vision screen No results found.  Dietary issues and exercise activities discussed:     Goals Addressed   None   Depression Screen    07/30/2022    4:12 PM 02/12/2022   11:02 AM 09/05/2021   10:24 AM 06/04/2021   10:20 AM 02/11/2021    2:26 PM 09/09/2020    2:55 PM 05/28/2020    4:11 PM  PHQ 2/9 Scores  PHQ - 2 Score 0 0 0 0 0 0 0  PHQ- 9 Score 0 0         Fall Risk    09/04/2022   11:14 AM 07/30/2022    4:02 PM 02/12/2022   10:57 AM 09/05/2021   10:24 AM 06/04/2021   10:20 AM  Fall Risk   Falls in the past year? 0 0 0 0 0  Number falls in past yr: 0 0 0 0 0  Injury with Fall? 0 0 0 0 0  Risk for fall due to : No Fall Risks No Fall Risks  No Fall Risks No Fall Risks  Follow up     Falls evaluation completed;Education provided;Falls prevention discussed    FALL RISK PREVENTION PERTAINING TO THE HOME:  Any stairs in or around the home? Yes  If so, are there any without handrails? No  Home free of loose throw rugs in walkways, pet beds, electrical cords, etc? Yes  Adequate lighting in your home to reduce risk of falls? Yes   ASSISTIVE DEVICES UTILIZED TO PREVENT FALLS:  Life alert? No  Use of a cane, walker or w/c? No  Grab bars in the bathroom? No  Shower chair or bench in shower?  No  Elevated toilet seat or a handicapped toilet? No   TIMED UP AND GO:  Was the test performed? Yes .  Length of time to ambulate 10 feet:  sec.   Gait slow and steady without use of assistive device  Cognitive Function:    09/04/2022   11:14 AM 06/04/2021   10:21 AM 05/28/2020    4:12 PM 08/27/2015    2:45 PM  MMSE - Mini Mental State Exam  Orientation to time _0 Orientation to Place _1 Registration _2 Attention/ Calculation _3 Recall _4 Language- name 2 objects _5 Language- repeat _6 Language- follow 3 step command _7 Language- read & follow direction _8 Write a sentence _9 Copy design _10 0  Total score _11 09/04/2022   11:16 AM  6CIT Screen  What Year? 0 points  What month? 0 points  What time? 0 points  Count back from 20 0 points  Months in reverse 0 points  Repeat phrase 0 points  Total Score 0 points    Immunizations Immunization History  Administered Date(s) Administered   Moderna Sars-Covid-2 Vaccination 04/22/2020, 05/20/2020   Pneumococcal Polysaccharide-23 01/24/2015    TDAP status: Due, Education has been provided regarding the importance of this vaccine. Advised may receive this vaccine at local pharmacy or Health Dept. Aware to provide a copy of the vaccination record if obtained from local pharmacy or Health Dept. Verbalized acceptance and understanding.  Flu Vaccine status: Declined, Education has been provided regarding the importance of this vaccine but patient still declined. Advised may receive this vaccine at local pharmacy or Health Dept. Aware to provide a copy of the vaccination record if obtained from local pharmacy or Health Dept. Verbalized acceptance and understanding.  Pneumococcal vaccine status: Up to date  Covid-19 vaccine status: Completed vaccines  Qualifies for Shingles Vaccine? Yes   Zostavax completed No   Shingrix Completed?: No.    Education  has been provided regarding the importance of this vaccine. Patient has been advised to call insurance company to determine out of pocket expense if they have not yet received this vaccine. Advised may also receive vaccine at local pharmacy or Health Dept. Verbalized acceptance and understanding.  Screening Tests Health Maintenance  Topic Date Due   DTaP/Tdap/Td (1 - Tdap) Never done   COLONOSCOPY (Pts 45-34yr Insurance coverage will need to be confirmed)  Never done   COVID-19 Vaccine (3 - 2023-24 season) 05/22/2022   INFLUENZA VACCINE  12/20/2022 (Originally 04/21/2022)   Pneumonia Vaccine 65 Years old (2 - PCV) 01/15/2023 (Originally 11/26/2021)   OPHTHALMOLOGY EXAM  07/31/2023 (Originally 11/26/2020)   Zoster Vaccines- Shingrix (1 of 2) 07/31/2023 (Originally 11/27/2006)   FOOT EXAM  09/05/2022   Diabetic kidney evaluation - Urine ACR  01/18/2023   HEMOGLOBIN A1C  01/28/2023   Diabetic kidney evaluation - eGFR measurement  07/31/2023   Medicare Annual Wellness (AWV)  09/05/2023   Hepatitis C Screening  Completed   HIV Screening  Completed   HPV VACCINES  Aged Out    Health Maintenance  Health Maintenance Due  Topic Date Due   DTaP/Tdap/Td (1 - Tdap) Never done   COLONOSCOPY (Pts 45-470yrInsurance coverage will need to be confirmed)  Never done   COVID-19 Vaccine (3 - 2023-24 season) 05/22/2022    Colorectal cancer screening : Patient has FIT testing kit   Lung Cancer Screening: (Low Dose CT Chest recommended if Age 65-80ears, 30 pack-year currently smoking OR have quit w/in 15years.) does not qualify.   Lung Cancer Screening Referral:   Additional Screening:  Hepatitis C Screening: does qualify; Completed 08/30/20  Vision Screening: Recommended annual ophthalmology exams for early detection of glaucoma and other disorders of the eye. Is the patient up to date with their annual eye exam?  Yes  Who is the provider or what is the name of the office in which the patient  attends annual eye exams? Orders have been put in and patient contacted for scheduling   If pt is not established with a provider, would they like to be referred to a provider to establish care? No .   Dental Screening: Recommended annual dental exams for proper oral hygiene  Community Resource Referral / Chronic Care Management: CRR required this visit?  No   CCM required this visit?  No      Plan:     I have personally reviewed and noted the following in the patient's chart:   Medical and social history Use of alcohol, tobacco or illicit drugs  Current medications and supplements including opioid prescriptions. Patient is not currently taking opioid prescriptions. Functional ability and status Nutritional status Physical activity Advanced directives List of other physicians Hospitalizations, surgeries, and ER visits in previous 12 months Vitals Screenings to include cognitive, depression, and falls Referrals and appointments  In addition, I have reviewed and discussed with patient certain preventive protocols, quality metrics, and best practice recommendations. A written personalized care  plan for preventive services as well as general preventive health recommendations were provided to patient.     Lillie Columbia, [place]   09/04/2022   Nurse Notes:   Addendum 09/05/2022: I have reviewed and agreed with the documentation for this Medicare wellness visit. Karle Plumber, MD

## 2022-09-04 NOTE — Patient Instructions (Signed)
Reminder: Ask about the tetanus shot at your next visit 

## 2022-09-08 ENCOUNTER — Other Ambulatory Visit: Payer: Self-pay

## 2022-09-08 ENCOUNTER — Ambulatory Visit: Payer: Self-pay | Admitting: Pharmacist

## 2022-11-30 ENCOUNTER — Ambulatory Visit: Payer: Self-pay | Admitting: Internal Medicine

## 2022-12-16 ENCOUNTER — Other Ambulatory Visit: Payer: Self-pay

## 2022-12-18 ENCOUNTER — Other Ambulatory Visit: Payer: Self-pay

## 2023-01-04 ENCOUNTER — Telehealth (HOSPITAL_COMMUNITY): Payer: Self-pay

## 2023-01-04 NOTE — Progress Notes (Incomplete)
Patient ID: Timothy Lambert, male   DOB: Mar 30, 1956, 67 y.o.   MRN: [id number]    Advanced Heart Failure Clinic Note   PCP: Marcine Matar, MD HF: Dr. Lajuana Ripple Polek is a 67 y.o. male with history of prior ETOH abuse, DM, HTN, CAD s/p anterior STEMI 5/16, paroxysmal atrial fibrillation, and CVA. Patient was admitted to [facility] in 5/16 with an ETOH withdrawal seizure.  He was also noted to have anterior ST elevation and was sent for cath.  This showed diffuse disease with culprit being 100% mLAD stenosis.  He had DES to mLAD. Echo showed EF 30-35%.  Post-PCI, he had atrial fibrillation and had a CVA.  He had no residual deficits from the CVA.  He was managed w/ ASA and Brilinta.  He was thought to be a poor candidate for anticoagulation given history of ETOH abuse.    Repeat echo 8/16 showed improvement in EF to 50-55%.   Admitted 4/23 w/ acute CHF, suspected shock and DKA. Started on BiPap, IV lasix and insulin, and abx for possible PNA. Echo showed EF 40-45%. [place] [address], suggestive of NSTEMI with ACS. No cath with lack of chest pain and AKI (SCr >4). Declined central access and blood products. In AF this admission and started on amiodarone. Drips weaned off and GDMT started, limited by CKD. Palliative consulted, was DNR. Discharged home with HH PT, weight 167 lbs.  Echo was done today and reviewed, EF 40% with mild LV enlargement, mild LVH, global hypokinesis, normal RV.   Patient returns for CHF followup.  He has been out of Eliquis and Bidil.  He has not been taking Lasix.  BP is very high today.  He reports excellent exercise tolerance.  He is able to do yardwork like mowing and raking with no problems.  No significant exertional dyspnea or chest pain.  No lightheadedness. He has been off amiodarone for a while now.  Weight up 4 lbs.   Labs (6/23): BNP 182, K 4.9, creatinine 4.08, LFTs normal, TSH normal Labs (8/23): LDL 71, hgb 11.8, K 4.6, creatinine 3.56,  TSH normal, LFTs normal  ECG (personally reviewed): NSR, LVH with repolarization abnormality, inferior Qs  PMH: 1. Type II diabetes 2. HTN 3. H/o heavy ETOH abuse: Has now quit. H/o withdrawal seizure. 4. GERD 5. CVA 02/19/15: Related to atrial fibrillation.  6. CKD stage 4: AKI with ACEI 7. CAD: Presented to [facility] with anterior STEMI 5/16.  LHC with 50% LM, 99% mLAD, 90% D1, 75% ramus, 70% mRCA.  Patient had DES to mLAD.   8. Ischemic cardiomyopathy: Echo (5/16) with EF 30-35% with wall motion abnormalities, normal RV size and systolic function. Echo (8/16) with EF 50-55%, mild mid-apical anteroseptal HK.  - Echo (4/23): EF 40-45% - Echo (11/23): EF 40% with mild LV enlargement, mild LVH, global hypokinesis, normal RV. 9. Atrial fibrillation: Paroxysmal.  Noted after STEMI in 5/16.  Initially not anticoagulated due to need for ticagrelor and concern over ETOH abuse.   10. Carotid dopplers (8/16) with minimal carotid disease.   SH: Married, prior heavy ETOH (has quit).  Prior smoker (has quit).  Lives in [place].  Jehovah's Witness.   FH: CAD  ROS: All systems reviewed and negative except as per HPI.   Current Outpatient Medications  Medication Sig Dispense Refill   acetaminophen (TYLENOL) 325 MG tablet Take 2 tablets (650 mg total) by mouth every 6 (six) hours as needed for mild pain (or  Fever >/= 101).     apixaban (ELIQUIS) 5 MG TABS tablet Take 1 tablet (5 mg total) by mouth 2 (two) times daily. 180 tablet 1   atorvastatin (LIPITOR) 80 MG tablet Take 1 tablet (80 mg total) by mouth daily. 90 tablet 1   carvedilol (COREG) 12.5 MG tablet Take 1.5 tablets (18.75 mg total) by mouth 2 (two) times daily. 180 tablet 3   Continuous Blood Gluc Receiver (FREESTYLE LIBRE 2 READER) DEVI Use to check blood sugar three times daily. 1 each 0   Continuous Blood Gluc Sensor (FREESTYLE LIBRE 2 SENSOR) MISC Change sensor every 14 (fourteen) days. 2 each 3   furosemide (LASIX) 20 MG tablet Take 2  tablets (40 mg total) by mouth as needed. For swelling or weight gain of 3lb in 24 hours or 5lb in a week (Patient not taking: Reported on 09/04/2022) 180 tablet 1   glucose blood (ACCU-CHEK GUIDE) test strip Use as instructed 3 times daily DX code E11.9 100 each 3   insulin glargine (LANTUS SOLOSTAR) 100 UNIT/ML Solostar Pen Inject 22 Units into the skin daily. 15 mL 2   insulin lispro (HUMALOG KWIKPEN) 100 UNIT/ML KwikPen Inject 2 Units into the skin 2 (two) times daily before lunch and supper. (Patient not taking: Reported on 09/04/2022) 15 mL 11   Insulin Pen Needle (TECHLITE PEN NEEDLES) 32G X 4 MM MISC Use as directed for insulin 100 each 5   Insulin Syringe-Needle U-100 (INSULIN SYRINGE .5CC/30GX1/2") 30G X 1/2" 0.5 ML MISC Check blood sugar TID & QHS 100 each 2   isosorbide-hydrALAZINE (BIDIL) 20-37.5 MG tablet Take 2 tablets by mouth 3 (three) times daily. 270 tablet 3   Lancets (ACCU-CHEK SOFT TOUCH) lancets Use as instructed 100 each 12   TRUEPLUS LANCETS 28G MISC 1 each by Does not apply route 3 (three) times daily before meals. 100 each 12   No current facility-administered medications for this visit.   There were no vitals taken for this visit.   Wt Readings from Last 3 Encounters:  07/30/22 76.2 kg (168 lb)  07/30/22 75.8 kg (167 lb)  05/27/22 72.9 kg (160 lb 12.8 oz)   General: NAD Neck: No JVD, no thyromegaly or thyroid nodule.  Lungs: Clear to auscultation bilaterally with normal respiratory effort. CV: Nondisplaced PMI.  Heart regular S1/S2, no S3/S4, no murmur.  No peripheral edema.  No carotid bruit.  Normal pedal pulses.  Abdomen: Soft, nontender, no hepatosplenomegaly, no distention.  Skin: Intact without lesions or rashes.  Neurologic: Alert and oriented x 3.  Psych: Normal affect. Extremities: No clubbing or cyanosis.  HEENT: Normal.   Assessment/Plan: 1. Chronic systolic CHF: History of ischemic CMP in 2016 post-anterior MI that recovered over time.  Echo in  8/16 with EF 50-55%.  Echo 4/23 showed EF 40-45% with apical HK, mild LVH, normal RV, dilated IVC. NHYA II. Echo today showed EF 40% with mild LV enlargement, mild LVH, global hypokinesis, normal RV.  He does not appear volume overloaded on exam, NYHA class II.  BP is markedly elevated but he has been out of Bidil.  - He has not needed Lasix, can stay off.  - Increase Coreg to 18.75 mg bid.  - Restart Bidil 2 tabs tid. - No MRA/ARNi/SGLT2i with CKD. - BMET/BNP today.  2. CAD: Anterior STEMI in 2016, DES to LAD.  NSTEMI 4/23. Had been off all meds x 1 month. ECG and elevate HS-Tnl suggestive of NSTEMI, but no cath due to lack of  chest pain and creatinine up to > 4. This was in setting of DKA and hypertension.  No chest pain.  - Would avoid cath in absence of STEMI given CKD stage IV..  - Continue ASA 81 - Continue atorvastatin 80 mg daily. Lipids ok in 8/23.  3. CKD stage IV: Baseline diabetic nephropathy, suspect HTN plays a role.  - Patient reports he would not want HD  - Needs better BP control, adjust meds as above.  - Follows with Nephrology (Dr. Glenna Fellows) 4. DM2: H/o DKA.  He is on insulin. Per PCP. 5. H/o ETOH abuse: He denies further use.  6. Atrial fibrillation: Remote history of PAF with CVA.  He was not anticoagulated at that time due to ETOH abuse. Had atrial fibrillation during 4/23 admission.  He is now back in NSR.  - He is off amiodarone, can stay off unless AF recurs.  - Restart Eliquis.   7. Jehovah's Witness: no blood products. 8. Hypertension: Blood pressure elevated as above. He has been off Bidil.   - Increase Coreg and restart Bidil as above.   Follow up: 1 month APP  Anderson Malta [facility] 01/04/23

## 2023-01-04 NOTE — Telephone Encounter (Signed)
Called and left patient a message to confirm/remind patient of their appointment at the Advanced Heart Failure Clinic on 01/05/23.

## 2023-01-05 ENCOUNTER — Other Ambulatory Visit: Payer: Self-pay

## 2023-01-05 ENCOUNTER — Other Ambulatory Visit (HOSPITAL_COMMUNITY): Payer: Self-pay | Admitting: Family Medicine

## 2023-01-05 ENCOUNTER — Ambulatory Visit (HOSPITAL_COMMUNITY)
Admission: RE | Admit: 2023-01-05 | Discharge: 2023-01-05 | Disposition: A | Discharge status: Home / Self Care | Payer: Medicare Other | Source: Ambulatory Visit | Attending: Family Medicine | Admitting: Family Medicine

## 2023-01-05 ENCOUNTER — Encounter (HOSPITAL_COMMUNITY): Payer: Self-pay

## 2023-01-05 VITALS — BP 184/96 | HR 95 | Wt 157.4 lb

## 2023-01-05 DIAGNOSIS — E1122 Type 2 diabetes mellitus with diabetic chronic kidney disease: Secondary | ICD-10-CM | POA: Diagnosis not present

## 2023-01-05 DIAGNOSIS — Z91148 Patient's other noncompliance with medication regimen for other reason: Secondary | ICD-10-CM

## 2023-01-05 DIAGNOSIS — I480 Paroxysmal atrial fibrillation: Secondary | ICD-10-CM | POA: Diagnosis not present

## 2023-01-05 DIAGNOSIS — I2510 Atherosclerotic heart disease of native coronary artery without angina pectoris: Secondary | ICD-10-CM | POA: Insufficient documentation

## 2023-01-05 DIAGNOSIS — N184 Chronic kidney disease, stage 4 (severe): Secondary | ICD-10-CM | POA: Diagnosis not present

## 2023-01-05 DIAGNOSIS — I120 Hypertensive chronic kidney disease with stage 5 chronic kidney disease or end stage renal disease: Secondary | ICD-10-CM | POA: Diagnosis not present

## 2023-01-05 DIAGNOSIS — Z7901 Long term (current) use of anticoagulants: Secondary | ICD-10-CM | POA: Insufficient documentation

## 2023-01-05 DIAGNOSIS — I252 Old myocardial infarction: Secondary | ICD-10-CM | POA: Insufficient documentation

## 2023-01-05 DIAGNOSIS — N185 Chronic kidney disease, stage 5: Secondary | ICD-10-CM

## 2023-01-05 DIAGNOSIS — Z794 Long term (current) use of insulin: Secondary | ICD-10-CM | POA: Insufficient documentation

## 2023-01-05 DIAGNOSIS — F1011 Alcohol abuse, in remission: Secondary | ICD-10-CM

## 2023-01-05 DIAGNOSIS — Z8673 Personal history of transient ischemic attack (TIA), and cerebral infarction without residual deficits: Secondary | ICD-10-CM | POA: Diagnosis not present

## 2023-01-05 DIAGNOSIS — E875 Hyperkalemia: Secondary | ICD-10-CM

## 2023-01-05 DIAGNOSIS — Z7982 Long term (current) use of aspirin: Secondary | ICD-10-CM | POA: Diagnosis not present

## 2023-01-05 DIAGNOSIS — I130 Hypertensive heart and chronic kidney disease with heart failure and stage 1 through stage 4 chronic kidney disease, or unspecified chronic kidney disease: Secondary | ICD-10-CM | POA: Insufficient documentation

## 2023-01-05 DIAGNOSIS — Z79899 Other long term (current) drug therapy: Secondary | ICD-10-CM | POA: Diagnosis not present

## 2023-01-05 DIAGNOSIS — I5022 Chronic systolic (congestive) heart failure: Secondary | ICD-10-CM | POA: Diagnosis not present

## 2023-01-05 LAB — BASIC METABOLIC PANEL
Anion gap: 9 (ref 5–15)
BUN: 41 mg/dL — ABNORMAL HIGH (ref 8–23)
CO2: 22 mmol/L (ref 22–32)
Calcium: 9.3 mg/dL (ref 8.9–10.3)
Chloride: 101 mmol/L (ref 98–111)
Creatinine, Ser: 3.3 mg/dL — ABNORMAL HIGH (ref 0.61–1.24)
GFR, Estimated: 20 mL/min — ABNORMAL LOW (ref >60)
Glucose, Bld: 432 mg/dL — ABNORMAL HIGH (ref 70–99)
Potassium: 5.7 mmol/L — ABNORMAL HIGH (ref 3.5–5.1)
Sodium: 132 mmol/L — ABNORMAL LOW (ref 135–145)

## 2023-01-05 MED ORDER — APIXABAN 5 MG PO TABS
5.0000 mg | ORAL_TABLET | Freq: Two times a day (BID) | ORAL | 1 refills | Status: DC
  Filled 2023-01-05: qty 180, 90d supply, fill #0

## 2023-01-05 MED ORDER — ISOSORB DINITRATE-HYDRALAZINE 20-37.5 MG PO TABS
2.0000 | ORAL_TABLET | Freq: Three times a day (TID) | ORAL | 3 refills | Status: DC
  Filled 2023-01-05 (×2): qty 180, 30d supply, fill #0

## 2023-01-05 MED ORDER — CARVEDILOL 25 MG PO TABS
25.0000 mg | ORAL_TABLET | Freq: Two times a day (BID) | ORAL | 11 refills | Status: DC
  Filled 2023-01-05: qty 60, 30d supply, fill #0

## 2023-01-05 NOTE — Addendum Note (Signed)
Encounter addended by: Theresia Bough, CMA on: 01/05/2023 9:28 AM  Actions taken: Order list changed, Diagnosis association updated

## 2023-01-05 NOTE — Progress Notes (Signed)
Medication Samples have been provided to the patient.  Drug name: lokelma       Strength: 10g        Qty: 1  LOT: [id number]  Exp.Date: 02/18/2025  Dosing instructions: ONE PACKET AS DIRECTED  The patient has been instructed regarding the correct time, dose, and frequency of taking this medication, including desired effects and most common side effects.   Theresia Bough 9:21 AM 01/05/2023

## 2023-01-05 NOTE — Patient Instructions (Addendum)
RESTART Eliquis 5 mg one tab twice a day INCREASE Coreg to 25 mg one tab twice a day RESTART Bidil 2 tab three times daily  Labs today We will only contact you if something comes back abnormal or we need to make some changes. Otherwise no news is good news!  You have been referred to [facility] for further CHF management in the comfort of your home. A team member will be in contact with you to arrange a home visit  Your physician wants you to follow-up in: 3 months with Dr Earlean Shawl will receive a reminder letter in the mail two months in advance. If you don't receive a letter, please call our office to schedule the follow-up appointment.   Do the following things EVERYDAY: Weigh yourself in the morning before breakfast. Write it down and keep it in a log. Take your medicines as prescribed Eat low salt foods--Limit salt (sodium) to 2000 mg per day.  Stay as active as you can everyday Limit all fluids for the day to less than 2 liters  At the Advanced Heart Failure Clinic, you and your health needs are our priority. As part of our continuing mission to provide you with exceptional heart care, we have created designated Provider Care Teams. These Care Teams include your primary Cardiologist (physician) and Advanced Practice Providers (APPs- Physician Assistants and Nurse Practitioners) who all work together to provide you with the care you need, when you need it.   You may see any of the following providers on your designated Care Team at your next follow up: Dr Arvilla Meres Dr Marca Ancona Dr. Marcos Eke, NP Robbie Lis, [place] [facility] [place], [place] Brynda Peon, NP Karle Plumber, PharmD   Please be sure to bring in all your medications bottles to every appointment.    Thank you for choosing [facility]

## 2023-01-06 ENCOUNTER — Other Ambulatory Visit: Payer: Self-pay

## 2023-01-06 ENCOUNTER — Telehealth (HOSPITAL_COMMUNITY): Payer: Self-pay | Admitting: Licensed Clinical Social Worker

## 2023-01-06 NOTE — Telephone Encounter (Signed)
CSW consulted to talk with pt regarding referral to [employer].  Unable to reach pt- left VM requesting return call  Burna Sis, LCSW Clinical Social Worker Advanced Heart Failure Clinic Desk#: [phone] Cell#: [phone]

## 2023-01-08 ENCOUNTER — Telehealth (HOSPITAL_COMMUNITY): Payer: Self-pay | Admitting: Licensed Clinical Social Worker

## 2023-01-08 ENCOUNTER — Telehealth (HOSPITAL_COMMUNITY): Payer: Self-pay

## 2023-01-08 ENCOUNTER — Ambulatory Visit (HOSPITAL_COMMUNITY)
Admission: RE | Admit: 2023-01-08 | Discharge: 2023-01-08 | Disposition: A | Discharge status: Home / Self Care | Payer: Medicare Other | Source: Ambulatory Visit | Attending: Internal Medicine | Admitting: Internal Medicine

## 2023-01-08 DIAGNOSIS — E875 Hyperkalemia: Secondary | ICD-10-CM | POA: Insufficient documentation

## 2023-01-08 LAB — BASIC METABOLIC PANEL
Anion gap: 12 (ref 5–15)
BUN: 45 mg/dL — ABNORMAL HIGH (ref 8–23)
CO2: 24 mmol/L (ref 22–32)
Calcium: 9.4 mg/dL (ref 8.9–10.3)
Chloride: 96 mmol/L — ABNORMAL LOW (ref 98–111)
Creatinine, Ser: 3.41 mg/dL — ABNORMAL HIGH (ref 0.61–1.24)
GFR, Estimated: 19 mL/min — ABNORMAL LOW (ref >60)
Glucose, Bld: 531 mg/dL (ref 70–99)
Potassium: 4.6 mmol/L (ref 3.5–5.1)
Sodium: 132 mmol/L — ABNORMAL LOW (ref 135–145)

## 2023-01-08 NOTE — Telephone Encounter (Signed)
CSW attempted to call pt regarding referral to [employer].  Unable to reach- left VM requesting return call.  Burna Sis, LCSW Clinical Social Worker Advanced [facility] Failure Clinic Desk#: [phone] Cell#: [phone]

## 2023-01-08 NOTE — Telephone Encounter (Signed)
Patient's wife called back and informed us Doc is refusing to go to ED.

## 2023-01-08 NOTE — Telephone Encounter (Signed)
Lab called with critical Glucose of 531. Please advise

## 2023-01-08 NOTE — Progress Notes (Unsigned)
Heart and Vascular Care Navigation  01/08/2023  Timothy Lambert [birth year] [id number]  Reason for Referral: Paramedicine referral   Engaged with patient by telephone for initial visit for Heart and Vascular Care Coordination.                                                                                                   Assessment:       CSW spoke with Timothy Lambert regarding providers recommendation for [employer].  Timothy Lambert in agreement to referral at this time.                               HRT/VAS Care Coordination     Patients Home Cardiology Office Heart Failure Clinic   Outpatient Care Team Community Par[facility]icine; Social Worker   Social Worker Name: Naaman Plummer, [phone]   Living arrangements for the past 2 months Single Family Home   Lives with: Spouse   Patient Current Insurance Coverage Managed Medicare   Patient Has Concern With Paying Medical Bills No   Does Patient Have Prescription Coverage? Yes   Patient Prescription Assistance Programs Patient Assistance Programs   Home Assistive Devices/Equipment None   DME Agency [facility]   [facility] Agency [facility]       Social History:                                                                             SDOH Screenings   Food Insecurity: No Food Insecurity (01/08/2023)  Housing: Low Risk  (01/08/2023)  Transportation Needs: Unknown (09/04/2022)  Utilities: Not At Risk (01/08/2023)  Alcohol Screen: Low Risk  (09/04/2022)  Depression (PHQ2-9): Low Risk  (07/30/2022)  Tobacco Use: Medium Risk (01/05/2023)    SDOH Interventions: Financial Resources:    Disability income $1796, wife income $2107/month  Food Insecurity:  Food Insecurity Interventions: Intervention Not Indicated  Housing Insecurity:  Housing Interventions: Intervention Not Indicated  Transportation:    Timothy Lambert or wife drives    Paramedicine Initial Assessment:  Housing:  In what kind of housing do you live?  House/apt/trailer/shelter? house  Do you live with anyone? spouse  Are you currently worried about losing your housing? no  Social:  What is your current marital status? married  Do you have family or friends who live locally? Timothy Lambert and Timothy Lambert's Timothy Lambert in law  Income:  What is your current source of income? Disability income [phone], $2107  Insurance:  Are you currently insured? UHC Medicare  Do you have prescription coverage? yes  Transportation:  Do you have transportation to your medical appointments? Still driving at this time though doesn't always feel comfortable driving- has his wife drive when he feels unable.   Daily Health Needs: Do  you have a working scale at home? Yes- endorses weighing every day  How do you manage your medications at home? Take them out of the bottle- have them lined up  Do you ever take your medications differently than prescribed? Try to take them on time- takes them as prescribed or the most part  Do you have issues affording your medications? Yes admits that some medications are expensive- namely Eliquis and his insulin.  CSW informed Timothy Lambert of BMS application to help with eliquis costs- sounds like Timothy Lambert and wife might already meet income requirements.  Contacted RNCM at [facility] where Timothy Lambert has PCP to inquire about possible insulin assistance  If yes, has this ever prevented you from obtaining medications? Yes- did not buy eliquis- waiting to hear back if he needs samples  Do you have any concerns with mobility at home? no  Do you use any assistive devices at home or have PCS at home? no  Do you have a PCP? [name]  Do you have any trouble reading or writing? no  Do you currently use tobacco products or have recently quit? no  Do you currently see any mental health providers? no  Are there any additional barriers you see to getting the care you need? Not at this time  Referral sent out for referral and follow up  Burna Sis, LCSW Clinical  Social Worker Advanced Heart Failure Clinic Desk#: [phone] Cell#: [phone]

## 2023-01-08 NOTE — Telephone Encounter (Signed)
I spoke to his wife and she is going to take him to the ED now while they are still out.  He is unable to check his BS because they are not home.

## 2023-01-11 ENCOUNTER — Telehealth (HOSPITAL_COMMUNITY): Payer: Self-pay | Admitting: Licensed Clinical Social Worker

## 2023-01-11 DIAGNOSIS — I5022 Chronic systolic (congestive) heart failure: Secondary | ICD-10-CM

## 2023-01-11 NOTE — Telephone Encounter (Signed)
CSW informed by paramedics that pt is not actually in [facility] and is too far over [employer] to be added to the [employer].  CSW called pt to inform and discuss if he is willing to drive to meet with the paramedic- left VM requesting return call.  Burna Sis, LCSW Clinical Social Worker Advanced [facility] Failure Clinic Desk#: [phone] Cell#: [phone]

## 2023-01-18 ENCOUNTER — Telehealth (HOSPITAL_COMMUNITY): Payer: Self-pay

## 2023-01-18 NOTE — Telephone Encounter (Signed)
FMLA forms faxed to [employer] on 01/15/23  my chart message sent to patient

## 2023-01-18 NOTE — Telephone Encounter (Signed)
Reached out to pt regarding referral and to see if they would be willing to meet me in [place] since [place] out of [place].   No answer- I LVM for either him or wife to call me back.    Kerry Hough, EMT-Paramedic  [phone] 01/18/2023

## 2023-01-26 ENCOUNTER — Telehealth (HOSPITAL_COMMUNITY): Payer: Self-pay

## 2023-01-26 NOTE — Telephone Encounter (Signed)
Wife returned phone call and agreeable to meet in [place] for a visit next wed. Advised her to bring all his meds/pill bottles/ pill box whatever he's using/taking to bring it all.    Kerry Hough, EMT-Paramedic  [phone] 01/26/2023

## 2023-01-26 NOTE — Telephone Encounter (Signed)
Attempted to reach pt or wife again regarding meeting in [place] for "home visit"-I am not able to go all the way to their home since [place] in [place].  I LVM for one of them to return my call.   Kerry Hough, EMT-Paramedic  [phone] 01/26/2023

## 2023-01-27 ENCOUNTER — Telehealth: Payer: Self-pay | Admitting: Pharmacist

## 2023-01-27 ENCOUNTER — Other Ambulatory Visit: Payer: Self-pay | Admitting: Pharmacist

## 2023-01-27 ENCOUNTER — Other Ambulatory Visit: Payer: Self-pay

## 2023-01-27 DIAGNOSIS — Z794 Long term (current) use of insulin: Secondary | ICD-10-CM

## 2023-01-27 MED ORDER — INSULIN LISPRO (1 UNIT DIAL) 100 UNIT/ML (KWIKPEN)
2.0000 [IU] | PEN_INJECTOR | Freq: Two times a day (BID) | SUBCUTANEOUS | 11 refills | Status: DC
  Filled 2023-01-27: qty 15, fill #0

## 2023-01-27 MED ORDER — INSULIN GLARGINE SOLOSTAR 100 UNIT/ML ~~LOC~~ SOPN
20.0000 [IU] | PEN_INJECTOR | Freq: Every day | SUBCUTANEOUS | 3 refills | Status: DC
  Filled 2023-01-27: qty 15, 75d supply, fill #0

## 2023-01-27 MED ORDER — LANTUS SOLOSTAR 100 UNIT/ML ~~LOC~~ SOPN
22.0000 [IU] | PEN_INJECTOR | Freq: Every day | SUBCUTANEOUS | 2 refills | Status: DC
  Filled 2023-01-27: qty 15, 68d supply, fill #0

## 2023-01-27 NOTE — Telephone Encounter (Signed)
Called patient to schedule an appointment for medication management. He is failing adherence measures as his insulin is cost prohibitive. I was unable to reach the patient so I left a HIPAA-compliant message requesting that the patient return my call. I also left a message with his wife.   Of note, I spoke to our patient advocate and pharmacy technician here at [facility]. We can apply for Humalog and Basaglar patient assistance with his Medicare plan. Patient has issues with transportation. Per Tresa Endo, we can have the meds shipped to his home address. She needs his household #, estimated monthly or annual income, and addreess verified to complete an application and send on his behalf. I requested this information in the voicemail I left for him. Once we have this information, we will complete the application. Routing to [place] just for record keeping.   Butch Penny, PharmD, P[place]y Baltimore, CPP Clinical Pharmacist [facility] [phone]

## 2023-01-29 ENCOUNTER — Other Ambulatory Visit: Payer: Self-pay

## 2023-02-03 ENCOUNTER — Other Ambulatory Visit (HOSPITAL_COMMUNITY): Payer: Self-pay

## 2023-02-03 ENCOUNTER — Other Ambulatory Visit: Payer: Self-pay | Admitting: Pharmacist

## 2023-02-03 ENCOUNTER — Other Ambulatory Visit: Payer: Self-pay

## 2023-02-03 DIAGNOSIS — Z794 Long term (current) use of insulin: Secondary | ICD-10-CM

## 2023-02-03 MED ORDER — DEXCOM G7 SENSOR MISC
6 refills | Status: DC
  Filled 2023-02-03 – 2023-02-10 (×2): qty 3, 30d supply, fill #0

## 2023-02-03 MED ORDER — DEXCOM G7 RECEIVER DEVI
0 refills | Status: DC
  Filled 2023-02-03 – 2023-02-10 (×2): qty 1, 30d supply, fill #0

## 2023-02-03 NOTE — Progress Notes (Signed)
Paramedicine Encounter    Patient ID: Timothy Lambert, male    DOB: 07-Jan-1956, 67 y.o.   MRN: [id number]   Complaints-none  Edema-none  Compliance with meds-yes   Pill box filled-yes If so, by whom-paramedic filled new box correctly    SOCIAL/MEDICAL BARRIERS:  PHARMACY USED -[facility]    MED ISSUES:  AFFORDABILITY-  PT ASSIST APPS NEEDED-yes will f/u with luke at PCP   PCP-yes dr Mila Homer  SOURCE OF INCOME-disability   TRANSPORTATION-wife takes/drives sometimes  FOOD INSECURITIES/NEEDS-none  FOOD STAMPS-no   REVIEWED DIET/FLUID/SALT RESTRICTIONS-yes  RENT/OWN HOME ISSUES-none  SOCIAL SUPPORT-wife-sister lives close   SAFETY/DOMESTIC ISSUES-no  SUBSTANCE ABUSE-no  DAILY WEIGHTS-some of the time   EDUCATE ON DISEASE PROCESS/SYMPTOMS/PURPOSES OF MEDS-yes  Refills needed-none at this time      First visit with the pt- [place] to do home visits but they are able to meet me at [facility].   He lives at home with his wife. She works 3rd shift at [employer].   Meds reviewed-  He used a once a day pill box and then took out the other meds from bottles for the rest of the doses.  But he had 2 carvedilol bottles-a 12.5mg  and 25mg -he had both of those placed in pill box  He was only taking bidil BID not TID.  I am not 100% sure he was taking eliquis BID. He was tripping up over his words on how he was taking that.  So those were corrected and gave him new pill box with multi row doses so his meds are accurate now.   He weighs maybe once a week-so we discussed that and the purpose, wife thinks he is a hard patient as he doesn't like to be told what to do.  He refused for me to take his CBG today. He said last time he checked it was a few days ago and it was around 160s.  He said he checks his feet often for sores/wounds and trims his own nails.   I encouraged best I could about  that and about checking his CBG's.  He reports good compliance with his meds though.   Suppose to get dexcom but he hasn't seen it yet--list shows freestyle so will f/u with luke at PCP office on that.  I got the info needed for the med assistance and will relay that to [facility] also.   Patient and wife household His-1791/month  Wife-2107/month  Meds verified and those errors were corrected.  Will see him again next week.   BP (!) 146/92   Pulse (!) 104   Resp 16   SpO2 98%  Weight at home last week-164 Weight yesterday-? Last visit weight-157 @ clinic   Patient Care Team: Marcine Matar, MD as PCP - General (Internal Medicine)  Patient Active Problem List   Diagnosis Date Noted   Anemia due to stage 5 chronic kidney disease, not on chronic dialysis (HCC) 02/12/2022   Hypertension associated with stage 5 chronic kidney disease due to type 2 diabetes mellitus (HCC) 02/12/2022   Anemia 01/22/2022   Lobar pneumonia (HCC) 01/21/2022   Alcohol abuse 01/21/2022   Acute on chronic systolic CHF (congestive heart failure) (HCC) 01/16/2022   Influenza vaccine refused 09/09/2020   Type 2 diabetes mellitus with hyperlipidemia (HCC) 06/29/2020   Acute kidney injury superimposed on chronic kidney disease (HCC) 02/15/2018  Macroalbuminuric diabetic nephropathy (HCC) 02/15/2018   Diabetic retinopathy (HCC) 11/18/2015   Paroxysmal atrial fibrillation (HCC) 10/28/2015   Cognitive deficit, post-stroke 05/13/2015   Essential hypertension 02/19/2015   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/2015   CVA (cerebral vascular accident) (HCC)    Non-ST elevation (NSTEMI) myocardial infarction (HCC)    Combined systolic and diastolic congestive heart failure (HCC)    Hyperlipidemia     Current Outpatient Medications:    acetaminophen (TYLENOL) 325 MG tablet, Take 2 tablets (650 mg total) by mouth every 6 (six) hours as needed for mild pain (or Fever >/= 101)., Disp: , Rfl:    apixaban (ELIQUIS) 5  MG TABS tablet, Take 1 tablet (5 mg total) by mouth 2 (two) times daily., Disp: 180 tablet, Rfl: 1   atorvastatin (LIPITOR) 80 MG tablet, Take 1 tablet (80 mg total) by mouth daily., Disp: 90 tablet, Rfl: 1   carvedilol (COREG) 25 MG tablet, Take 1 tablet (25 mg total) by mouth 2 (two) times daily., Disp: 60 tablet, Rfl: 11   furosemide (LASIX) 20 MG tablet, Take 2 tablets (40 mg total) by mouth as needed. For swelling or weight gain of 3lb in 24 hours or 5lb in a week, Disp: 180 tablet, Rfl: 1   isosorbide-hydrALAZINE (BIDIL) 20-37.5 MG tablet, Take 2 tablets by mouth 3 (three) times daily., Disp: 270 tablet, Rfl: 3   Continuous Blood Gluc Receiver (FREESTYLE LIBRE 2 READER) DEVI, Use to check blood sugar three times daily. (Patient not taking: Reported on 01/05/2023), Disp: 1 each, Rfl: 0   Continuous Blood Gluc Sensor (FREESTYLE LIBRE 2 SENSOR) MISC, Change sensor every 14 (fourteen) days. (Patient not taking: Reported on 01/05/2023), Disp: 2 each, Rfl: 3   glucose blood (ACCU-CHEK GUIDE) test strip, Use as instructed 3 times daily DX code E11.9, Disp: 100 each, Rfl: 3   Insulin Glargine Solostar (LANTUS) 100 UNIT/ML Solostar Pen, Inject 20 Units into the skin daily., Disp: 15 mL, Rfl: 3   insulin lispro (HUMALOG KWIKPEN) 100 UNIT/ML KwikPen, Inject 2 Units into the skin 2 (two) times daily before lunch and supper., Disp: 15 mL, Rfl: 11   Insulin Pen Needle (TECHLITE PEN NEEDLES) 32G X 4 MM MISC, Use as directed for insulin, Disp: 100 each, Rfl: 5   Insulin Syringe-Needle U-100 (INSULIN SYRINGE .5CC/30GX1/2") 30G X 1/2" 0.5 ML MISC, Check blood sugar TID & QHS, Disp: 100 each, Rfl: 2   Lancets (ACCU-CHEK SOFT TOUCH) lancets, Use as instructed, Disp: 100 each, Rfl: 12   TRUEPLUS LANCETS 28G MISC, 1 each by Does not apply route 3 (three) times daily before meals., Disp: 100 each, Rfl: 12 Allergies  Allergen Reactions   Trulicity [Dulaglutide]     Upset his stomach      Social History    Socioeconomic History   Marital status: Married    Spouse name: Not on file   Number of children: Not on file   Years of education: Not on file   Highest education level: Not on file  Occupational History   Not on file  Tobacco Use   Smoking status: Former    Packs/day: 0.33    Years: 14.00    Additional pack years: 0.00    Total pack years: 4.62    Types: Cigarettes   Smokeless tobacco: Never   Tobacco comments:    "quit smoking cigarettes in 1992"  Substance and Sexual Activity   Alcohol use: No    Comment: 02/19/2015 "stopped drinking 01/22/2015"   Drug  use: No   Sexual activity: Not Currently  Other Topics Concern   Not on file  Social History Narrative   Lives   Caffeine use: Coffee sometimes   Social Determinants of Health   Financial Resource Strain: Not on file  Food Insecurity: No Food Insecurity (01/08/2023)   Hunger Vital Sign    Worried About Running Out of Food in the Last Year: Never true    Ran Out of Food in the Last Year: Never true  Transportation Needs: Unknown (09/04/2022)   PRAPARE - Administrator, Civil Service (Medical): Not on file    Lack of Transportation (Non-Medical): No  Physical Activity: Not on file  Stress: Not on file  Social Connections: Not on file  Intimate Partner Violence: Not on file    Physical Exam      Future Appointments  Date Time Provider Department Center  03/30/2023  2:10 PM Marcine Matar, MD [facility] None       Kerry Hough, Paramedic [phone] [facility] Paramedic  02/03/23

## 2023-02-04 ENCOUNTER — Other Ambulatory Visit: Payer: Self-pay

## 2023-02-05 ENCOUNTER — Other Ambulatory Visit: Payer: Self-pay

## 2023-02-08 ENCOUNTER — Other Ambulatory Visit: Payer: Self-pay

## 2023-02-10 ENCOUNTER — Telehealth (HOSPITAL_COMMUNITY): Payer: Self-pay | Admitting: Licensed Clinical Social Worker

## 2023-02-10 ENCOUNTER — Other Ambulatory Visit (HOSPITAL_COMMUNITY): Payer: Self-pay

## 2023-02-10 ENCOUNTER — Other Ambulatory Visit: Payer: Self-pay

## 2023-02-10 NOTE — Telephone Encounter (Signed)
HF Paramedicine Team Based Care Meeting  One Month Post Enrollment Assessment  HF MD- NA  HF NP - Amy Clegg NP-C   [facility] HF Paramedicine  Katie Vicente Males  [facility] admit within the last 30 days for heart failure?   Medications concerns? Medicines were very mixed up last week so paramedic filling for now.  Pt was filling his box and he was missing medications.  Confused about what the meds were and how to take them. But this week he filled his pill box correctly   SDOH concerns? Insulin affordability- just got approved for assistance so hopefully coming in the mail  Barriers to discharge? New referral but if he continues to show good understanding of meds might can DC soon.    Burna Sis, LCSW Clinical Social Worker [facility] Desk#: [phone] Cell#: [phone]

## 2023-02-10 NOTE — Progress Notes (Signed)
Paramedicine Encounter    Patient ID: Timothy Lambert, male    DOB: 03-24-1956, 66 y.o.   MRN: [id number]   Complaints-none   Edema-none    Compliance with meds-yes  Pill box filled-yes If so, by whom-by pt   Refills needed-none right now     Pt reports he is doing ok. He denies any issues or complaints at this time.  Pt denies dizziness, no c/p, no sob.  He reports no issues with his meds.  He did get approved for the insulin assistance and I relayed to him that he should receive it by mail.  He has not needed to take the lasix recently.  Appetite ok.  No bleeding issues.  He recently started weighing himself and its been between 160-161.   Pt refilled his own pill box. I checked it and it was correct.   He is due for visit in July in HF clinic.  Will have to call next month and get that sch.    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 (HCC) 02/12/2022   Anemia 01/22/2022   Lobar pneumonia (HCC) 01/21/2022   Alcohol abuse 01/21/2022   Acute on chronic systolic CHF (congestive heart failure) (HCC) 01/16/2022   Influenza vaccine refused 09/09/2020   Type 2 diabetes mellitus with hyperlipidemia (HCC) 06/29/2020   Acute kidney injury superimposed on chronic kidney disease (HCC) 02/15/2018   Macroalbuminuric diabetic nephropathy (HCC) 02/15/2018   Diabetic retinopathy (HCC) 11/18/2015   Paroxysmal atrial fibrillation (HCC) 10/28/2015   Cognitive deficit, post-stroke 05/13/2015   Essential hypertension 02/19/2015   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    CVA (cerebral vascular accident) (HCC)    Non-ST elevation (NSTEMI) myocardial infarction (HCC)    Combined systolic and diastolic congestive heart failure (HCC)    Hyperlipidemia     Current Outpatient Medications:    acetaminophen (TYLENOL) 325 MG tablet, Take 2 tablets (650 mg total) by mouth every 6 (six) hours as needed for mild pain (or Fever >/= 101)., Disp: , Rfl:    apixaban (ELIQUIS) 5 MG TABS tablet, Take 1 tablet (5 mg total) by mouth 2 (two) times daily., Disp: 180 tablet, Rfl: 1   atorvastatin (LIPITOR) 80 MG tablet, Take 1 tablet (80 mg total) by mouth daily., Disp: 90 tablet, Rfl: 1   carvedilol (COREG) 25 MG tablet, Take 1 tablet (25 mg total) by mouth 2 (two) times daily., Disp: 60 tablet, Rfl: 11   furosemide (LASIX) 20 MG tablet, Take 2 tablets (40 mg total) by mouth as needed. For swelling or weight gain of 3lb in 24 hours or 5lb in a week, Disp: 180 tablet, Rfl: 1   glucose blood (ACCU-CHEK GUIDE) test strip, Use as instructed 3 times daily DX code E11.9, Disp: 100 each, Rfl: 3   Insulin Glargine Solostar (LANTUS) 100 UNIT/ML Solostar Pen, Inject 20 Units into the skin daily., Disp: 15 mL, Rfl: 3   insulin lispro (HUMALOG KWIKPEN) 100 UNIT/ML KwikPen, Inject 2 Units into the skin 2 (two)  times daily before lunch and supper., Disp: 15 mL, Rfl: 11   Insulin Pen Needle (TECHLITE PEN NEEDLES) 32G X 4 MM MISC, Use as directed for insulin, Disp: 100 each, Rfl: 5   Insulin Syringe-Needle U-100 (INSULIN SYRINGE .5CC/30GX1/2") 30G X 1/2" 0.5 ML MISC, Check blood sugar TID & QHS, Disp: 100 each, Rfl: 2   isosorbide-hydrALAZINE (BIDIL) 20-37.5 MG tablet, Take 2 tablets by mouth 3 (three) times daily., Disp: 270 tablet, Rfl: 3   Lancets (ACCU-CHEK SOFT TOUCH) lancets, Use as instructed, Disp: 100 each, Rfl: 12   TRUEPLUS LANCETS 28G MISC, 1 each by Does not apply route 3 (three) times daily before meals., Disp: 100 each, Rfl: 12   Continuous Glucose Receiver (DEXCOM G7 RECEIVER)  DEVI, Use to check blood sugar three times daily. Change sensors once every 10 days. E11.21, Disp: 1 each, Rfl: 0   Continuous Glucose Sensor (DEXCOM G7 SENSOR) MISC, Use to check blood sugar three times daily. Change sensors once every 10 days. E11.21, Disp: 3 each, Rfl: 6 Allergies  Allergen Reactions   Trulicity [Dulaglutide]     Upset his stomach      Social History   Socioeconomic History   Marital status: Married    Spouse name: Not on file   Number of children: Not on file   Years of education: Not on file   Highest education level: Not on file  Occupational History   Not on file  Tobacco Use   Smoking status: Former    Packs/day: 0.33    Years: 14.00    Additional pack years: 0.00    Total pack years: 4.62    Types: Cigarettes   Smokeless tobacco: Never   Tobacco comments:    "quit smoking cigarettes in 1992"  Substance and Sexual Activity   Alcohol use: No    Comment: 02/19/2015 "stopped drinking 01/22/2015"   Drug use: No   Sexual activity: Not Currently  Other Topics Concern   Not on file  Social History Narrative   Lives   Caffeine use: Coffee sometimes   Social Determinants of Health   Financial Resource Strain: Not on file  Food Insecurity: No Food Insecurity (01/08/2023)   Hunger Vital Sign    Worried About Running Out of Food in the Last Year: Never true    Ran Out of Food in the Last Year: Never true  Transportation Needs: Unknown (09/04/2022)   PRAPARE - Administrator, Civil Service (Medical): Not on file    Lack of Transportation (Non-Medical): No  Physical Activity: Not on file  Stress: Not on file  Social Connections: Not on file  Intimate Partner Violence: Not on file    Physical Exam      Future Appointments  Date Time Provider Department Center  03/30/2023  2:10 PM Marcine Matar, MD [facility] None       Kerry Hough, Paramedic [phone] [facility] Paramedic  02/10/23

## 2023-02-16 ENCOUNTER — Other Ambulatory Visit: Payer: Self-pay

## 2023-02-17 ENCOUNTER — Other Ambulatory Visit (HOSPITAL_COMMUNITY): Payer: Self-pay

## 2023-02-17 NOTE — Progress Notes (Signed)
Paramedicine Encounter    Patient ID: Timothy Lambert, male    DOB: [birth year], 67 y.o.   MRN: [id number]   Complaints-none   Edema-none  Compliance with meds-yes   Pill box filled-yes If so, by whom-pt filled it up   Refills needed-none right now   Pt reports he is feeling ok, he denies increased sob, no dizziness, no c/p, no palpitations.  Appetite ok. No bleeding issues.   I gave him info to get OOP expenses from all pharmacies he has used this year and his wife needs to do the same.   I also gave him the spec pharmacy number to call to check on the status of his order-he has not received the insulin yet.  He did get the dexcom in the mail, hasn't started using it yet. He is going to get family member to help him apply it-he did not bring it with him today.  I advised him if he didn't get it figured out by next week then to bring it next time I see him.     CBG"s running around 150    BP (!) 146/80   Pulse 84   Resp 16   Wt 161 lb (73 kg)   SpO2 98%   BMI 25.22 kg/m  Weight yesterday-161 Last visit weight-160  Patient Care Team: Marcine Matar, MD as PCP - General (Internal Medicine)  Patient Active Problem List   Diagnosis Date Noted  . Anemia due to stage 5 chronic kidney disease, not on chronic dialysis (HCC) 02/12/2022  . Hypertension associated with stage 5 chronic kidney disease due to type 2 diabetes mellitus (HCC) 02/12/2022  . Anemia 01/22/2022  . Lobar pneumonia (HCC) 01/21/2022  . Alcohol abuse 01/21/2022  . Acute on chronic systolic CHF (congestive heart failure) (HCC) 01/16/2022  . Influenza vaccine refused 09/09/2020  . Type 2 diabetes mellitus with hyperlipidemia (HCC) 06/29/2020  . Acute kidney injury superimposed on chronic kidney disease (HCC) 02/15/2018  . Macroalbuminuric diabetic nephropathy (HCC) 02/15/2018  . Diabetic retinopathy (HCC) 11/18/2015  . Paroxysmal atrial fibrillation (HCC) 10/28/2015  . Cognitive deficit, post-stroke  05/13/2015  . Essential hypertension 02/19/2015  . 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 CVA (cerebral vascular accident) (HCC)   . Non-ST elevation (NSTEMI) myocardial infarction (HCC)   . Combined systolic and diastolic congestive heart failure (HCC)   . Hyperlipidemia     Current Outpatient Medications:  .  acetaminophen (TYLENOL) 325 MG tablet, Take 2 tablets (650 mg total) by mouth every 6 (six) hours as needed for mild pain (or Fever >/= 101)., Disp: , Rfl:  .  apixaban (ELIQUIS) 5 MG TABS tablet, Take 1 tablet (5 mg total) by mouth 2 (two) times daily., Disp: 180 tablet, Rfl: 1 .  atorvastatin (LIPITOR) 80 MG tablet, Take 1 tablet (80 mg total) by mouth daily., Disp: 90 tablet, Rfl: 1 .  carvedilol (COREG) 25 MG tablet, Take 1 tablet (25 mg total) by mouth 2 (two) times daily., Disp: 60 tablet, Rfl: 11 .  Continuous Glucose Receiver (DEXCOM G7 RECEIVER) DEVI, Use to check blood sugar three times daily. Change sensors once every 10 days. E11.21, Disp: 1 each, Rfl: 0 .  Continuous Glucose Sensor (DEXCOM G7 SENSOR) MISC, Use to check blood sugar three times daily. Change sensors once every 10 days. E11.21, Disp: 3 each, Rfl: 6 .  Insulin Glargine Solostar (LANTUS) 100 UNIT/ML Solostar Pen, Inject 20 Units into the skin daily., Disp: 15 mL, Rfl: 3 .  isosorbide-hydrALAZINE (BIDIL) 20-37.5 MG tablet, Take 2 tablets by mouth 3 (three) times daily., Disp: 270 tablet, Rfl: 3 .  Lancets (ACCU-CHEK SOFT TOUCH) lancets, Use as instructed, Disp: 100 each, Rfl: 12 .  TRUEPLUS LANCETS 28G MISC, 1 each by Does not apply route 3 (three) times daily before meals., Disp: 100 each, Rfl: 12 .  furosemide (LASIX) 20 MG tablet, Take 2 tablets (40 mg total) by mouth as needed. For swelling or weight gain of 3lb in 24 hours or 5lb in a week, Disp: 180 tablet, Rfl: 1 .  glucose blood (ACCU-CHEK GUIDE) test strip, Use as instructed 3 times daily DX code E11.9, Disp: 100 each, Rfl: 3 .  insulin lispro  (HUMALOG KWIKPEN) 100 UNIT/ML KwikPen, Inject 2 Units into the skin 2 (two) times daily before lunch and supper., Disp: 15 mL, Rfl: 11 .  Insulin Pen Needle (TECHLITE PEN NEEDLES) 32G X 4 MM MISC, Use as directed for insulin, Disp: 100 each, Rfl: 5 .  Insulin Syringe-Needle U-100 (INSULIN SYRINGE .5CC/30GX1/2") 30G X 1/2" 0.5 ML MISC, Check blood sugar TID & QHS, Disp: 100 each, Rfl: 2 Allergies  Allergen Reactions  . Trulicity [Dulaglutide]     Upset his stomach      Social History   Socioeconomic History  . Marital status: Married    Spouse name: Not on file  . Number of children: Not on file  . Years of education: Not on file  . Highest education level: Not on file  Occupational History  . Not on file  Tobacco Use  . Smoking status: Former    Packs/day: 0.33    Years: 14.00    Additional pack years: 0.00    Total pack years: 4.62    Types: Cigarettes  . Smokeless tobacco: Never  . Tobacco comments:    "quit smoking cigarettes in 1992"  Substance and Sexual Activity  . Alcohol use: No    Comment: 02/19/2015 "stopped drinking 01/22/2015"  . Drug use: No  . Sexual activity: Not Currently  Other Topics Concern  . Not on file  Social History Narrative   Lives   Caffeine use: Coffee sometimes   Social Determinants of Health   Financial Resource Strain: Not on file  Food Insecurity: No Food Insecurity (01/08/2023)   Hunger Vital Sign   . Worried About Programme researcher, broadcasting/film/video in the Last Year: Never true   . Ran Out of Food in the Last Year: Never true  Transportation Needs: Unknown (09/04/2022)   PRAPARE - Transportation   . Lack of Transportation (Medical): Not on file   . Lack of Transportation (Non-Medical): No  Physical Activity: Not on file  Stress: Not on file  Social Connections: Not on file  Intimate Partner Violence: Not on file    Physical Exam      Future Appointments  Date Time Provider Department Center  03/30/2023  2:10 PM Marcine Matar, MD  [facility] None       Kerry Hough, Paramedic [phone] [facility] Paramedic  02/17/23

## 2023-02-24 ENCOUNTER — Telehealth (HOSPITAL_COMMUNITY): Payer: Self-pay

## 2023-02-24 NOTE — Telephone Encounter (Signed)
Pt reached out to me this morning to cancel our appointment this morning due to sch conflict with another appointment.  He is agreeable to visit next Wednesday.   Kerry Hough, EMT-Paramedic  [phone] 02/24/2023

## 2023-03-03 ENCOUNTER — Telehealth (HOSPITAL_COMMUNITY): Payer: Self-pay

## 2023-03-03 NOTE — Telephone Encounter (Signed)
Pt called me this morning to resch our appoint that we had for today due to his wife has his car-hers broke down so that's the only vehicle they have between them and she has not gotten home yet from work.   I will reach out next week to see if he will be able to meet.   Kerry Hough, EMT-Paramedic  [phone] 03/03/2023

## 2023-03-08 ENCOUNTER — Encounter: Payer: Self-pay | Admitting: *Deleted

## 2023-03-08 NOTE — Progress Notes (Signed)
[facility] Quality Team Note  Name: Timothy Lambert Date of Birth: Sep 10, 1956 MRN: [id number] Date: 03/08/2023  [facility] Quality Team has reviewed this patient's chart, please see recommendations below:  [facility] Quality Other; Pt has open gaps for the following: AWV (not due until 08/2023) COL (Cologuard order on file 07/2022) EED: Declined eye event HBD:   Called pt to offer eye event 05/14/2023 and fu on cologuard.  Pt declined event.  Pt said he still has kit at home to do cologuard.  Hasn't completed yet.  Would provider be able to order A1C at ov on 03/30/2023?

## 2023-03-10 ENCOUNTER — Telehealth (HOSPITAL_COMMUNITY): Payer: Self-pay

## 2023-03-10 NOTE — Telephone Encounter (Signed)
Katie requested I reach out to Mr. Rankin to cancel their paramedicine visit for today as she is out of the office. Mr. Mizzell agreed and understood- I advised him Florentina Addison would reach out tomorrow to reschedule. He agreed and reported no needs at this time. Call complete.  Maralyn Sago, EMT-Paramedic [phone] 03/10/2023

## 2023-03-15 ENCOUNTER — Telehealth: Payer: Self-pay | Admitting: *Deleted

## 2023-03-15 NOTE — Progress Notes (Unsigned)
  Care Coordination  Outreach Note  03/15/2023 Name: Timothy Lambert MRN: [id number] DOB: October 25, 1955   Care Coordination Outreach Attempts: An unsuccessful telephone outreach was attempted today to offer the patient information about available care coordination services.  Follow Up Plan:  Additional outreach attempts will be made to offer the patient care coordination information and services.   Encounter Outcome:  No Answer  Christie Nottingham  Care Coordination Care Guide  Direct Dial: [phone]

## 2023-03-17 ENCOUNTER — Other Ambulatory Visit (HOSPITAL_COMMUNITY): Payer: Self-pay

## 2023-03-17 NOTE — Progress Notes (Signed)
  Care Coordination   Note   03/17/2023 Name: Timothy Lambert MRN: [id number] DOB: 1955-12-22  Timothy Lambert is a 67 y.o. year old male who sees Marcine Matar, MD for primary care. I reached out to Benny Lennert by phone today to offer care coordination services.  Mr. Dorn was given information about Care Coordination services today including:   The Care Coordination services include support from the care team which includes your Nurse Coordinator, Clinical Social Worker, or Pharmacist.  The Care Coordination team is here to help remove barriers to the health concerns and goals most important to you. Care Coordination services are voluntary, and the patient may decline or stop services at any time by request to their care team member.   Care Coordination Consent Status: Patient agreed to services and verbal consent obtained.   Follow up plan:  Telephone appointment with care coordination team member scheduled for:  04/01/23  Encounter Outcome:  Pt. Scheduled  [facility] Coordination Care Guide  Direct Dial: [phone]

## 2023-03-17 NOTE — Progress Notes (Signed)
Paramedicine Encounter    Patient ID: Timothy Lambert, male    DOB: 03/16/1956, 67 y.o.   MRN: [id number]   Complaints-none   Edema-none  Compliance with meds-yes   Pill box filled-yes  If so, by whom-himself   Refills needed-none     Pt reports he is doing ok. He reports he can't eat as much as he used to, his appetite has decreased over the years and plus he can't eat what he wants to like he used to.  He denies any increased sob, no c/p, no dizziness. No bleeding issues.  No falls.   He has not got the OOP expense report yet for him and his wife to apply for eliquis assistance.  He hasthe dexcom but hasn't put it on yet.  I checked his pill box and everything is correct in there.  He is due in July for next heart clinic appointment.  He reports drinking water and urinating ok. He denies dark yellow urine.   His HR is elevated @ 104.  Took meds around 730 this morning. I am seeing him at 9am.  Last couple times his HR was in the 80s.  He denies any issues or complaints-no dizziness, no light headiness. No c/p, no palpitations.  EKG done-no acute findings. HR just elevated. He does drink a soda a day so he may not be getting enough water.   Will f/u next week.     BP 130/88   Pulse (!) 102   Resp 16   Wt 160 lb (72.6 kg)   SpO2 98%   BMI 25.06 kg/m  Weight yesterday-160 Last visit weight-161  Patient Care Team: Marcine Matar, MD as PCP - General (Internal Medicine)  Patient Active Problem List   Diagnosis Date Noted   Anemia due to stage 5 chronic kidney disease, not on chronic dialysis (HCC) 02/12/2022   Hypertension associated with stage 5 chronic kidney disease due to type 2 diabetes mellitus (HCC) 02/12/2022   Anemia 01/22/2022   Lobar pneumonia (HCC) 01/21/2022   Alcohol abuse 01/21/2022   Acute on chronic systolic CHF (congestive heart failure) (HCC) 01/16/2022   Influenza vaccine refused 09/09/2020   Type 2 diabetes mellitus with hyperlipidemia  (HCC) 06/29/2020   Acute kidney injury superimposed on chronic kidney disease (HCC) 02/15/2018   Macroalbuminuric diabetic nephropathy (HCC) 02/15/2018   Diabetic retinopathy (HCC) 11/18/2015   Paroxysmal atrial fibrillation (HCC) 10/28/2015   Cognitive deficit, post-stroke 05/13/2015   Essential hypertension 02/19/2015   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   CVA (cerebral vascular accident) (HCC)    Non-ST elevation (NSTEMI) myocardial infarction (HCC)    Combined systolic and diastolic congestive heart failure (HCC)    Hyperlipidemia     Current Outpatient Medications:    acetaminophen (TYLENOL) 325 MG tablet, Take 2 tablets (650 mg total) by mouth every 6 (six) hours as needed for mild pain (or Fever >/= 101)., Disp: , Rfl:    apixaban (ELIQUIS) 5 MG TABS tablet, Take 1 tablet (5 mg total) by mouth 2 (two) times daily., Disp: 180 tablet, Rfl: 1   atorvastatin (LIPITOR) 80 MG tablet, Take 1 tablet (80 mg total) by mouth daily., Disp: 90 tablet, Rfl: 1   carvedilol (COREG) 25 MG tablet, Take 1 tablet (25 mg total) by mouth 2 (two) times daily., Disp: 60 tablet, Rfl: 11   Continuous Glucose Receiver (DEXCOM G7 RECEIVER) DEVI, Use to check blood sugar three times daily. Change sensors once every 10 days. E11.21, Disp:  1 each, Rfl: 0   Continuous Glucose Sensor (DEXCOM G7 SENSOR) MISC, Use to check blood sugar three times daily. Change sensors once every 10 days. E11.21, Disp: 3 each, Rfl: 6   furosemide (LASIX) 20 MG tablet, Take 2 tablets (40 mg total) by mouth as needed. For swelling or weight gain of 3lb in 24 hours or 5lb in a week, Disp: 180 tablet, Rfl: 1   glucose blood (ACCU-CHEK GUIDE) test strip, Use as instructed 3 times daily DX code E11.9, Disp: 100 each, Rfl: 3   Insulin Glargine Solostar (LANTUS) 100 UNIT/ML Solostar Pen, Inject 20 Units into the skin daily., Disp: 15 mL, Rfl: 3   insulin lispro (HUMALOG KWIKPEN) 100 UNIT/ML KwikPen, Inject 2 Units into the skin 2 (two)  times daily before lunch and supper., Disp: 15 mL, Rfl: 11   Insulin Pen Needle (TECHLITE PEN NEEDLES) 32G X 4 MM MISC, Use as directed for insulin, Disp: 100 each, Rfl: 5   Insulin Syringe-Needle U-100 (INSULIN SYRINGE .5CC/30GX1/2") 30G X 1/2" 0.5 ML MISC, Check blood sugar TID & QHS, Disp: 100 each, Rfl: 2   isosorbide-hydrALAZINE (BIDIL) 20-37.5 MG tablet, Take 2 tablets by mouth 3 (three) times daily., Disp: 270 tablet, Rfl: 3   Lancets (ACCU-CHEK SOFT TOUCH) lancets, Use as instructed, Disp: 100 each, Rfl: 12   TRUEPLUS LANCETS 28G MISC, 1 each by Does not apply route 3 (three) times daily before meals., Disp: 100 each, Rfl: 12 Allergies  Allergen Reactions   Trulicity [Dulaglutide]     Upset his stomach      Social History   Socioeconomic History   Marital status: Married    Spouse name: Not on file   Number of children: Not on file   Years of education: Not on file   Highest education level: Not on file  Occupational History   Not on file  Tobacco Use   Smoking status: Former    Packs/day: 0.33    Years: 14.00    Additional pack years: 0.00    Total pack years: 4.62    Types: Cigarettes   Smokeless tobacco: Never   Tobacco comments:    "quit smoking cigarettes in 1992"  Substance and Sexual Activity   Alcohol use: No    Comment: 02/19/2015 "stopped drinking 01/22/2015"   Drug use: No   Sexual activity: Not Currently  Other Topics Concern   Not on file  Social History Narrative   Lives   Caffeine use: Coffee sometimes   Social Determinants of Health   Financial Resource Strain: Not on file  Food Insecurity: No Food Insecurity (01/08/2023)   Hunger Vital Sign    Worried About Running Out of Food in the Last Year: Never true    Ran Out of Food in the Last Year: Never true  Transportation Needs: Unknown (09/04/2022)   PRAPARE - Administrator, Civil Service (Medical): Not on file    Lack of Transportation (Non-Medical): No  Physical Activity: Not on  file  Stress: Not on file  Social Connections: Not on file  Intimate Partner Violence: Not on file    Physical Exam      Future Appointments  Date Time Provider Department Center  03/30/2023  2:10 PM Marcine Matar, MD [facility] None       Kerry Hough, Paramedic [phone] [facility] Paramedic  03/17/23

## 2023-03-24 ENCOUNTER — Other Ambulatory Visit: Payer: Self-pay

## 2023-03-24 ENCOUNTER — Other Ambulatory Visit (HOSPITAL_COMMUNITY): Payer: Self-pay

## 2023-03-24 NOTE — Progress Notes (Signed)
Paramedicine Encounter    Patient ID: Timothy Lambert, male    DOB: 10/13/1955, 67 y.o.   MRN: [id number]   Complaints-none   Edema-none   Compliance with meds-reports so but pill count is not on time  Pill box filled-already filled  If so, by whom-pt does it   Refills needed-none      Pt reports he is doing well. He denies increased sob, no dizziness, no c/p.  He reports appetite ok, but he just can't eat what he really wants so his appetite has changed.  Pt denies any bleeding issues. Denies any feeling of heart racing or anything like that.    He is due for his f/u so sent scheduling a message for that.  His atorvastatin bottle has a lot of pills left in there, last fill was march. He should have needed another refill by then- I sent pharmacy team message on LF dates and p/u. So maybe he is getting new meds and putting them in old bottle, will see what is said on those fill dates.  Also asked about the eliquis and bidil fills as well   Still waiting on the OOP expense report from pharmacy.   I did ask him about it, and he denied missing any doses. He did have meds this morning and last night also.   CBG the other day-160   Still not using the dexcom yet.  BP 120/70   Pulse 84   Resp 16   Wt 160 lb (72.6 kg)   SpO2 98%   BMI 25.06 kg/m  Weight yesterday--160  Last visit weight-160   Patient Care Team: Marcine Matar, MD as PCP - General (Internal Medicine) Clarene Duke, Karma Lew, RN as [facility] Care Management  Patient Active Problem List   Diagnosis Date Noted  . Anemia due to stage 5 chronic kidney disease, not on chronic dialysis (HCC) 02/12/2022  . Hypertension associated with stage 5 chronic kidney disease due to type 2 diabetes mellitus (HCC) 02/12/2022  . Anemia 01/22/2022  . Lobar pneumonia (HCC) 01/21/2022  . Alcohol abuse 01/21/2022  . Acute on chronic systolic CHF (congestive heart failure) (HCC) 01/16/2022  . Influenza vaccine  refused 09/09/2020  . Type 2 diabetes mellitus with hyperlipidemia (HCC) 06/29/2020  . Acute kidney injury superimposed on chronic kidney disease (HCC) 02/15/2018  . Macroalbuminuric diabetic nephropathy (HCC) 02/15/2018  . Diabetic retinopathy (HCC) 11/18/2015  . Paroxysmal atrial fibrillation (HCC) 10/28/2015  . Cognitive deficit, post-stroke 05/13/2015  . Essential hypertension 02/19/2015  . 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 CVA (cerebral vascular accident) (HCC)   . Non-ST elevation (NSTEMI) myocardial infarction (HCC)   . Combined systolic and diastolic congestive heart failure (HCC)   . Hyperlipidemia     Current Outpatient Medications:  .  acetaminophen (TYLENOL) 325 MG tablet, Take 2 tablets (650 mg total) by mouth every 6 (six) hours as needed for mild pain (or Fever >/= 101)., Disp: , Rfl:  .  apixaban (ELIQUIS) 5 MG TABS tablet, Take 1 tablet (5 mg total) by mouth 2 (two) times daily., Disp: 180 tablet, Rfl: 1 .  atorvastatin (LIPITOR) 80 MG tablet, Take 1 tablet (80 mg total) by mouth daily., Disp: 90 tablet, Rfl: 1 .  carvedilol (COREG) 25 MG tablet, Take 1 tablet (25 mg total) by mouth 2 (two) times daily., Disp: 60 tablet, Rfl: 11 .  furosemide (LASIX) 20 MG tablet, Take 2 tablets (40 mg total) by mouth as needed. For swelling  or weight gain of 3lb in 24 hours or 5lb in a week, Disp: 180 tablet, Rfl: 1 .  Insulin Glargine Solostar (LANTUS) 100 UNIT/ML Solostar Pen, Inject 20 Units into the skin daily., Disp: 15 mL, Rfl: 3 .  insulin lispro (HUMALOG KWIKPEN) 100 UNIT/ML KwikPen, Inject 2 Units into the skin 2 (two) times daily before lunch and supper., Disp: 15 mL, Rfl: 11 .  Insulin Pen Needle (TECHLITE PEN NEEDLES) 32G X 4 MM MISC, Use as directed for insulin, Disp: 100 each, Rfl: 5 .  Insulin Syringe-Needle U-100 (INSULIN SYRINGE .5CC/30GX1/2") 30G X 1/2" 0.5 ML MISC, Check blood sugar TID & QHS, Disp: 100 each, Rfl: 2 .  isosorbide-hydrALAZINE (BIDIL) 20-37.5  MG tablet, Take 2 tablets by mouth 3 (three) times daily., Disp: 270 tablet, Rfl: 3 .  Lancets (ACCU-CHEK SOFT TOUCH) lancets, Use as instructed, Disp: 100 each, Rfl: 12 .  TRUEPLUS LANCETS 28G MISC, 1 each by Does not apply route 3 (three) times daily before meals., Disp: 100 each, Rfl: 12 .  Continuous Glucose Receiver (DEXCOM G7 RECEIVER) DEVI, Use to check blood sugar three times daily. Change sensors once every 10 days. E11.21, Disp: 1 each, Rfl: 0 .  Continuous Glucose Sensor (DEXCOM G7 SENSOR) MISC, Use to check blood sugar three times daily. Change sensors once every 10 days. E11.21, Disp: 3 each, Rfl: 6 .  glucose blood (ACCU-CHEK GUIDE) test strip, Use as instructed 3 times daily DX code E11.9, Disp: 100 each, Rfl: 3 Allergies  Allergen Reactions  . Trulicity [Dulaglutide]     Upset his stomach      Social History   Socioeconomic History  . Marital status: Married    Spouse name: Not on file  . Number of children: Not on file  . Years of education: Not on file  . Highest education level: Not on file  Occupational History  . Not on file  Tobacco Use  . Smoking status: Former    Packs/day: 0.33    Years: 14.00    Additional pack years: 0.00    Total pack years: 4.62    Types: Cigarettes  . Smokeless tobacco: Never  . Tobacco comments:    "quit smoking cigarettes in 1992"  Substance and Sexual Activity  . Alcohol use: No    Comment: 02/19/2015 "stopped drinking 01/22/2015"  . Drug use: No  . Sexual activity: Not Currently  Other Topics Concern  . Not on file  Social History Narrative   Lives   Caffeine use: Coffee sometimes   Social Determinants of Health   Financial Resource Strain: Not on file  Food Insecurity: No Food Insecurity (01/08/2023)   Hunger Vital Sign   . Worried About Programme researcher, broadcasting/film/video in the Last Year: Never true   . Ran Out of Food in the Last Year: Never true  Transportation Needs: Unknown (09/04/2022)   PRAPARE - Transportation   . Lack of  Transportation (Medical): Not on file   . Lack of Transportation (Non-Medical): No  Physical Activity: Not on file  Stress: Not on file  Social Connections: Not on file  Intimate Partner Violence: Not on file    Physical Exam      Future Appointments  Date Time Provider Department Center  03/30/2023  2:10 PM Marcine Matar, MD [facility] None  04/01/2023 12:00 PM Little, Karma Lew, RN [facility] None       Kerry Hough, Paramedic [phone] [facility] Paramedic  03/24/23

## 2023-03-30 ENCOUNTER — Ambulatory Visit: Payer: Medicare Other | Admitting: Internal Medicine

## 2023-03-31 ENCOUNTER — Telehealth (HOSPITAL_COMMUNITY): Payer: Self-pay

## 2023-03-31 NOTE — Telephone Encounter (Signed)
Pt called to cancel appointment for today.  He said he had things to do this morning before it gets too hot.     Kerry Hough, EMT-Paramedic  [phone] 03/31/2023

## 2023-04-01 ENCOUNTER — Ambulatory Visit: Payer: Self-pay

## 2023-04-01 NOTE — Patient Outreach (Signed)
  Care Coordination   04/01/2023 Name: Timothy Lambert MRN: [id number] DOB: Feb 02, 1956   Care Coordination Outreach Attempts:  An unsuccessful telephone outreach was attempted for a scheduled appointment today.  Follow Up Plan:  Additional outreach attempts will be made to offer the patient care coordination information and services.   Encounter Outcome:  No Answer   Care Coordination Interventions:  No, not indicated    Delsa Sale, RN, BSN, CCM Care Management Coordinator [facility] Care Management  Direct Phone: [phone]

## 2023-04-06 ENCOUNTER — Encounter (HOSPITAL_COMMUNITY): Payer: Medicare Other

## 2023-04-06 NOTE — Telephone Encounter (Signed)
 This encounter was created in error - please disregard.

## 2023-04-07 ENCOUNTER — Other Ambulatory Visit (HOSPITAL_COMMUNITY): Payer: Self-pay

## 2023-04-07 NOTE — Progress Notes (Signed)
Paramedicine Encounter    Patient ID: Timothy Lambert, male    DOB: 1956/09/15, 66 y.o.   MRN: [id number]   Complaints-none  Edema-none  Compliance with meds-questionable -  Pill box filled-he already filled it  If so, by whom-pt   Refills needed-none   Pt reports he is doing ok.  He denies increased sob, no edema noted, no c/p. No dizziness.  His pill box was already filled. According to him he is taking them, he said he took them but then his pill box was filled up. I asked him when he filled it up, he did say this morning. We met at 9am.   He still has same eliquis and bidil bottle that he has had since I started seeing him. I confirmed with pharmacy that he has not p/u anything since April and it was 30 day supply. So we talked about that. He then said that he recently started back  His meds. I went through each medicine and explained why he takes each one. He then said he thinks docs give customers all kinds of meds they dont need. So I truly wonder if he is taking any at all.   Pt denies increased sob. No dizziness. No palpitations. No c/p.   BP 130/88   Pulse 90   Resp 16   Wt 159 lb (72.1 kg)   SpO2 98%   BMI 24.90 kg/m  Weight yesterday-159 Last visit weight-160  Patient Care Team: Marcine Matar, MD as PCP - General (Internal Medicine) Clarene Duke Karma Lew, RN as [facility] Care Management  Patient Active Problem List   Diagnosis Date Noted   Anemia due to stage 5 chronic kidney disease, not on chronic dialysis (HCC) 02/12/2022   Hypertension associated with stage 5 chronic kidney disease due to type 2 diabetes mellitus (HCC) 02/12/2022   Anemia 01/22/2022   Lobar pneumonia (HCC) 01/21/2022   Alcohol abuse 01/21/2022   Acute on chronic systolic CHF (congestive heart failure) (HCC) 01/16/2022   Influenza vaccine refused 09/09/2020   Type 2 diabetes mellitus with hyperlipidemia (HCC) 06/29/2020   Acute kidney injury superimposed on chronic kidney  disease (HCC) 02/15/2018   Macroalbuminuric diabetic nephropathy (HCC) 02/15/2018   Diabetic retinopathy (HCC) 11/18/2015   Paroxysmal atrial fibrillation (HCC) 10/28/2015   Cognitive deficit, post-stroke 05/13/2015   Essential hypertension 02/19/2015   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   CVA (cerebral vascular accident) (HCC)    Non-ST elevation (NSTEMI) myocardial infarction (HCC)    Combined systolic and diastolic congestive heart failure (HCC)    Hyperlipidemia     Current Outpatient Medications:    acetaminophen (TYLENOL) 325 MG tablet, Take 2 tablets (650 mg total) by mouth every 6 (six) hours as needed for mild pain (or Fever >/= 101)., Disp: , Rfl:    apixaban (ELIQUIS) 5 MG TABS tablet, Take 1 tablet (5 mg total) by mouth 2 (two) times daily., Disp: 180 tablet, Rfl: 1   atorvastatin (LIPITOR) 80 MG tablet, Take 1 tablet (80 mg total) by mouth daily., Disp: 90 tablet, Rfl: 1   carvedilol (COREG) 25 MG tablet, Take 1 tablet (25 mg total) by mouth 2 (two) times daily., Disp: 60 tablet, Rfl: 11   Continuous Glucose Receiver (DEXCOM G7 RECEIVER) DEVI, Use to check blood sugar three times daily. Change sensors once every 10 days. E11.21, Disp: 1 each, Rfl: 0   Continuous Glucose Sensor (DEXCOM G7 SENSOR) MISC, Use to check blood sugar three times daily. Change sensors  once every 10 days. E11.21, Disp: 3 each, Rfl: 6   furosemide (LASIX) 20 MG tablet, Take 2 tablets (40 mg total) by mouth as needed. For swelling or weight gain of 3lb in 24 hours or 5lb in a week, Disp: 180 tablet, Rfl: 1   glucose blood (ACCU-CHEK GUIDE) test strip, Use as instructed 3 times daily DX code E11.9, Disp: 100 each, Rfl: 3   Insulin Glargine Solostar (LANTUS) 100 UNIT/ML Solostar Pen, Inject 20 Units into the skin daily., Disp: 15 mL, Rfl: 3   insulin lispro (HUMALOG KWIKPEN) 100 UNIT/ML KwikPen, Inject 2 Units into the skin 2 (two) times daily before lunch and supper., Disp: 15 mL, Rfl: 11   Insulin  Pen Needle (TECHLITE PEN NEEDLES) 32G X 4 MM MISC, Use as directed for insulin, Disp: 100 each, Rfl: 5   Insulin Syringe-Needle U-100 (INSULIN SYRINGE .5CC/30GX1/2") 30G X 1/2" 0.5 ML MISC, Check blood sugar TID & QHS, Disp: 100 each, Rfl: 2   isosorbide-hydrALAZINE (BIDIL) 20-37.5 MG tablet, Take 2 tablets by mouth 3 (three) times daily., Disp: 270 tablet, Rfl: 3   Lancets (ACCU-CHEK SOFT TOUCH) lancets, Use as instructed, Disp: 100 each, Rfl: 12   TRUEPLUS LANCETS 28G MISC, 1 each by Does not apply route 3 (three) times daily before meals., Disp: 100 each, Rfl: 12 Allergies  Allergen Reactions   Trulicity [Dulaglutide]     Upset his stomach      Social History   Socioeconomic History   Marital status: Married    Spouse name: Not on file   Number of children: Not on file   Years of education: Not on file   Highest education level: Not on file  Occupational History   Not on file  Tobacco Use   Smoking status: Former    Current packs/day: 0.33    Average packs/day: 0.3 packs/day for 14.0 years (4.6 ttl pk-yrs)    Types: Cigarettes   Smokeless tobacco: Never   Tobacco comments:    "quit smoking cigarettes in 1992"  Substance and Sexual Activity   Alcohol use: No    Comment: 02/19/2015 "stopped drinking 01/22/2015"   Drug use: No   Sexual activity: Not Currently  Other Topics Concern   Not on file  Social History Narrative   Lives   Caffeine use: Coffee sometimes   Social Determinants of Health   Financial Resource Strain: Not on file  Food Insecurity: No Food Insecurity (01/08/2023)   Hunger Vital Sign    Worried About Running Out of Food in the Last Year: Never true    Ran Out of Food in the Last Year: Never true  Transportation Needs: Unknown (09/04/2022)   PRAPARE - Administrator, Civil Service (Medical): Not on file    Lack of Transportation (Non-Medical): No  Physical Activity: Not on file  Stress: Not on file  Social Connections: Not on file   Intimate Partner Violence: Not on file    Physical Exam      Future Appointments  Date Time Provider Department Center  04/13/2023  8:40 AM Laurey Morale, MD [facility] None  04/20/2023  9:00 AM Little, Karma Lew, RN [facility] None       Kerry Hough, Paramedic [phone] [facility] Paramedic  04/07/23

## 2023-04-13 ENCOUNTER — Encounter (HOSPITAL_COMMUNITY): Payer: Self-pay | Admitting: Cardiology

## 2023-04-13 ENCOUNTER — Ambulatory Visit (HOSPITAL_COMMUNITY)
Admission: RE | Admit: 2023-04-13 | Discharge: 2023-04-13 | Disposition: A | Discharge status: Home / Self Care | Payer: Medicare Other | Source: Ambulatory Visit | Attending: Cardiology | Admitting: Cardiology

## 2023-04-13 ENCOUNTER — Other Ambulatory Visit (HOSPITAL_COMMUNITY): Payer: Self-pay

## 2023-04-13 VITALS — BP 140/88 | HR 98 | Wt 152.8 lb

## 2023-04-13 DIAGNOSIS — E1122 Type 2 diabetes mellitus with diabetic chronic kidney disease: Secondary | ICD-10-CM | POA: Diagnosis not present

## 2023-04-13 DIAGNOSIS — Z8673 Personal history of transient ischemic attack (TIA), and cerebral infarction without residual deficits: Secondary | ICD-10-CM | POA: Diagnosis not present

## 2023-04-13 DIAGNOSIS — I5022 Chronic systolic (congestive) heart failure: Secondary | ICD-10-CM

## 2023-04-13 DIAGNOSIS — Z794 Long term (current) use of insulin: Secondary | ICD-10-CM | POA: Insufficient documentation

## 2023-04-13 DIAGNOSIS — I2510 Atherosclerotic heart disease of native coronary artery without angina pectoris: Secondary | ICD-10-CM | POA: Insufficient documentation

## 2023-04-13 DIAGNOSIS — I252 Old myocardial infarction: Secondary | ICD-10-CM | POA: Insufficient documentation

## 2023-04-13 DIAGNOSIS — Z955 Presence of coronary angioplasty implant and graft: Secondary | ICD-10-CM | POA: Insufficient documentation

## 2023-04-13 DIAGNOSIS — Z7901 Long term (current) use of anticoagulants: Secondary | ICD-10-CM | POA: Insufficient documentation

## 2023-04-13 DIAGNOSIS — F1011 Alcohol abuse, in remission: Secondary | ICD-10-CM | POA: Diagnosis not present

## 2023-04-13 DIAGNOSIS — N184 Chronic kidney disease, stage 4 (severe): Secondary | ICD-10-CM | POA: Insufficient documentation

## 2023-04-13 DIAGNOSIS — I130 Hypertensive heart and chronic kidney disease with heart failure and stage 1 through stage 4 chronic kidney disease, or unspecified chronic kidney disease: Secondary | ICD-10-CM | POA: Insufficient documentation

## 2023-04-13 DIAGNOSIS — I255 Ischemic cardiomyopathy: Secondary | ICD-10-CM | POA: Diagnosis not present

## 2023-04-13 DIAGNOSIS — Z79899 Other long term (current) drug therapy: Secondary | ICD-10-CM | POA: Diagnosis not present

## 2023-04-13 DIAGNOSIS — I480 Paroxysmal atrial fibrillation: Secondary | ICD-10-CM | POA: Diagnosis not present

## 2023-04-13 LAB — LIPID PANEL
Cholesterol: 128 mg/dL (ref 0–200)
HDL: 28 mg/dL — ABNORMAL LOW (ref >40)
LDL Cholesterol: 75 mg/dL (ref 0–99)
Total CHOL/HDL Ratio: 4.6 RATIO
Triglycerides: 125 mg/dL (ref <150)
VLDL: 25 mg/dL (ref 0–40)

## 2023-04-13 LAB — CBC
HCT: 34.1 % — ABNORMAL LOW (ref 39.0–52.0)
Hemoglobin: 11.9 g/dL — ABNORMAL LOW (ref 13.0–17.0)
MCH: 28.9 pg (ref 26.0–34.0)
MCHC: 34.9 g/dL (ref 30.0–36.0)
MCV: 82.8 fL (ref 80.0–100.0)
Platelets: 167 10*3/uL (ref 150–400)
RBC: 4.12 MIL/uL — ABNORMAL LOW (ref 4.22–5.81)
RDW: 13 % (ref 11.5–15.5)
WBC: 5.7 10*3/uL (ref 4.0–10.5)
nRBC: 0 % (ref 0.0–0.2)

## 2023-04-13 LAB — BASIC METABOLIC PANEL
Anion gap: 12 (ref 5–15)
BUN: 37 mg/dL — ABNORMAL HIGH (ref 8–23)
CO2: 19 mmol/L — ABNORMAL LOW (ref 22–32)
Calcium: 8.9 mg/dL (ref 8.9–10.3)
Chloride: 104 mmol/L (ref 98–111)
Creatinine, Ser: 3.6 mg/dL — ABNORMAL HIGH (ref 0.61–1.24)
GFR, Estimated: 18 mL/min — ABNORMAL LOW (ref >60)
Glucose, Bld: 348 mg/dL — ABNORMAL HIGH (ref 70–99)
Potassium: 4.3 mmol/L (ref 3.5–5.1)
Sodium: 135 mmol/L (ref 135–145)

## 2023-04-13 NOTE — Progress Notes (Signed)
Patient ID: Timothy Lambert, male   DOB: 08-28-1956, 67 y.o.   MRN: [id number]    Advanced Heart Failure Clinic Note   PCP: Marcine Matar, MD HF: Dr. Lajuana Ripple Torti is a 67 y.o. male with history of prior ETOH abuse, DM, HTN, CAD s/p anterior STEMI 5/16, paroxysmal atrial fibrillation, and CVA. Patient was admitted to [facility] in 5/16 with an ETOH withdrawal seizure.  He was also noted to have anterior ST elevation and was sent for cath.  This showed diffuse disease with culprit being 100% mLAD stenosis.  He had DES to mLAD. Echo showed EF 30-35%.  Post-PCI, he had atrial fibrillation and had a CVA.  He had no residual deficits from the CVA.  He was managed w/ ASA and Brilinta.  He was thought to be a poor candidate for anticoagulation given history of ETOH abuse.    Repeat echo 8/16 showed improvement in EF to 50-55%.   Admitted 4/23 w/ acute CHF, suspected shock and DKA. Started on BiPap, IV lasix and insulin, and abx for possible PNA. Echo showed EF 40-45%. [place]ated [place]nl [place] 138[address], suggestive of NSTEMI with ACS. No cath with lack of chest pain and AKI (SCr >4). Declined central access and blood products. In AF this admission and started on amiodarone. Drips weaned off and GDMT started, limited by CKD. Palliative consulted, was DNR. Discharged home with HH PT, weight 167 lbs.  Echo 11/23 EF 40% with mild LV enlargement, mild LVH, global hypokinesis, normal RV.   Today he returns for HF follow up.  He is followed by paramedicine now, the paramedic with him today says that he is very sporadic about taking his medications.  He denies exertional dyspnea or chest pain.  No orthopnea/PND.  Weight down 5 lbs.  Blood glucose has been under poor control.  No palpitations, he is in NSR today.  He has not used Lasix.  No BRBPR/melena.  Labs (6/23): BNP 182, K 4.9, creatinine 4.08, LFTs normal, TSH normal Labs (8/23): LDL 71, hgb 11.8, K 4.6, creatinine 3.56, TSH normal, LFTs  normal Labs (11/23): K 5.5, creatinine 3.06 Labs (4/24): K 4.6, creatinine 3.41.   ECG (personally reviewed): NSR, old inferior MI, possible old anterior MI, LVH with repolarization abnormality.   PMH: 1. Type II diabetes 2. HTN 3. H/o heavy ETOH abuse: Has now quit. H/o withdrawal seizure. 4. GERD 5. CVA 02/19/15: Related to atrial fibrillation.  6. CKD stage 4: AKI with ACEI 7. CAD: Presented to [facility] with anterior STEMI 5/16.  LHC with 50% LM, 99% mLAD, 90% D1, 75% ramus, 70% mRCA.  Patient had DES to mLAD.   8. Ischemic cardiomyopathy: Echo (5/16) with EF 30-35% with wall motion abnormalities, normal RV size and systolic function. Echo (8/16) with EF 50-55%, mild mid-apical anteroseptal HK.  - Echo (4/23): EF 40-45% - Echo (11/23): EF 40% with mild LV enlargement, mild LVH, global hypokinesis, normal RV. 9. Atrial fibrillation: Paroxysmal.  Noted after STEMI in 5/16.  Initially not anticoagulated due to need for ticagrelor and concern over ETOH abuse.   10. Carotid dopplers (8/16) with minimal carotid disease.   SH: Married, prior heavy ETOH (has quit).  Prior smoker (has quit).  Lives in [place].  Jehovah's Witness.   FH: CAD  ROS: All systems reviewed and negative except as per HPI.   Current Outpatient Medications  Medication Sig Dispense Refill   acetaminophen (TYLENOL) 325 MG tablet Take 2 tablets (650 mg total)  by mouth every 6 (six) hours as needed for mild pain (or Fever >/= 101).     apixaban (ELIQUIS) 5 MG TABS tablet Take 1 tablet (5 mg total) by mouth 2 (two) times daily. (Patient taking differently: Take 5 mg by mouth daily.) 180 tablet 1   atorvastatin (LIPITOR) 80 MG tablet Take 1 tablet (80 mg total) by mouth daily. 90 tablet 1   carvedilol (COREG) 25 MG tablet Take 1 tablet (25 mg total) by mouth 2 (two) times daily. 60 tablet 11   furosemide (LASIX) 20 MG tablet Take 2 tablets (40 mg total) by mouth as needed. For swelling or weight gain of 3lb in 24 hours or 5lb  in a week 180 tablet 1   glucose blood (ACCU-CHEK GUIDE) test strip Use as instructed 3 times daily DX code E11.9 100 each 3   insulin lispro (HUMALOG KWIKPEN) 100 UNIT/ML KwikPen Inject 2 Units into the skin 2 (two) times daily before lunch and supper. 15 mL 11   Insulin Pen Needle (TECHLITE PEN NEEDLES) 32G X 4 MM MISC Use as directed for insulin 100 each 5   Insulin Syringe-Needle U-100 (INSULIN SYRINGE .5CC/30GX1/2") 30G X 1/2" 0.5 ML MISC Check blood sugar TID & QHS 100 each 2   isosorbide-hydrALAZINE (BIDIL) 20-37.5 MG tablet Take 2 tablets by mouth 3 (three) times daily. 270 tablet 3   Lancets (ACCU-CHEK SOFT TOUCH) lancets Use as instructed 100 each 12   TRUEPLUS LANCETS 28G MISC 1 each by Does not apply route 3 (three) times daily before meals. 100 each 12   Continuous Glucose Receiver (DEXCOM G7 RECEIVER) DEVI Use to check blood sugar three times daily. Change sensors once every 10 days. E11.21 (Patient not taking: Reported on 04/13/2023) 1 each 0   Continuous Glucose Sensor (DEXCOM G7 SENSOR) MISC Use to check blood sugar three times daily. Change sensors once every 10 days. E11.21 (Patient not taking: Reported on 04/13/2023) 3 each 6   No current facility-administered medications for this encounter.   BP (!) 140/88   Pulse 98   Wt 69.3 kg (152 lb 12.8 oz)   SpO2 99%   BMI 23.93 kg/m    Wt Readings from Last 3 Encounters:  04/13/23 69.3 kg (152 lb 12.8 oz)  04/07/23 72.1 kg (159 lb)  03/24/23 72.6 kg (160 lb)   General: NAD Neck: No JVD, no thyromegaly or thyroid nodule.  Lungs: Clear to auscultation bilaterally with normal respiratory effort. CV: Nondisplaced PMI.  Heart regular S1/S2, no S3/S4, no murmur.  No peripheral edema.  No carotid bruit.  Normal pedal pulses.  Abdomen: Soft, nontender, no hepatosplenomegaly, no distention.  Skin: Intact without lesions or rashes.  Neurologic: Alert and oriented x 3.  Psych: Normal affect. Extremities: No clubbing or cyanosis.   HEENT: Normal.   Assessment/Plan: 1. Chronic systolic CHF: History of ischemic CMP in 2016 post-anterior MI that recovered over time.  Echo in 8/16 with EF 50-55%.  Echo 4/23 showed EF 40-45% with apical HK, mild LVH, normal RV, dilated IVC. NHYA II. Echo 11/23 showed EF 40% with mild LV enlargement, mild LVH, global hypokinesis, normal RV.  He does not appear volume overloaded on exam, NYHA class I-II.  BP is elevated today.  I suspect medication compliance has been poor.  - He has not needed Lasix, continue to use prn. - Continue Coreg 25 mg bid, needs to take regularly.   - Continue Bidil 2 tabs tid, needs to take regularly. - No MRA/ARNi/SGLT2i  with CKD stage IV. - Echo in 11/24.  2. CAD: Anterior STEMI in 2016, DES to LAD.  NSTEMI 4/23. Had been off all meds x 1 month. ECG and elevate HS-Tnl suggestive of NSTEMI, but no cath due to lack of chest pain and creatinine up to > 4. This was in setting of DKA and hypertension.  No chest pain.  - Would avoid cath in absence of STEMI given CKD stage IV.  - No ASA given Eliquis use.  - Continue atorvastatin 80 mg daily. Check lipids today.   3. CKD stage IV: Baseline diabetic nephropathy, suspect HTN plays a role.  - Patient reports he would not want HD  - Needs better BP control, needs to be compliant with his current meds.  - Follows with Nephrology (Dr. Glenna Fellows) - BMET today. . 4. DM2: H/o DKA.  He is on insulin. Per PCP.  Poor blood glucose control.  5. H/o ETOH abuse: He denies further use.  6. Atrial fibrillation: Remote history of PAF with CVA.  He was not anticoagulated at that time due to ETOH abuse. Had atrial fibrillation during 4/23 admission.  He is now back in NSR.  - He is off amiodarone, can stay off unless AF recurs.  - Continue Eliquis 5 mg bid.  He needs to take this regularly, poor compliance.  7. Jehovah's Witness: no blood products. 8. Hypertension: Blood pressure elevated as above. He has been sporadic with Coreg and Bidil.    - Discussed medication compliance.    Followup in 4 months with APP.   Marca Ancona  04/13/23

## 2023-04-13 NOTE — Patient Instructions (Signed)
PLEASE TAKE ALL YOUR MEDICATIONS AS ORDERED BY THE DOCTOR.   Labs done today, your results will be available in MyChart, we will contact you for abnormal readings.  Your physician has requested that you have an echocardiogram. Echocardiography is a painless test that uses sound waves to create images of your heart. It provides your doctor with information about the size and shape of your heart and how well your heart's chambers and valves are working. This procedure takes approximately one hour. There are no restrictions for this procedure. Please do NOT wear cologne, perfume, aftershave, or lotions (deodorant is allowed). Please arrive 15 minutes prior to your appointment time.  Your physician recommends that you schedule a follow-up appointment in: 4 months  If you have any questions or concerns before your next appointment please send [place] a message through [employer] or call our office at [phone].    TO LEAVE A MESSAGE FOR THE NURSE SELECT OPTION 2, PLEASE LEAVE A MESSAGE INCLUDING: YOUR NAME DATE OF BIRTH CALL BACK NUMBER REASON FOR CALL**this is important as we prioritize the call backs  YOU WILL RECEIVE A CALL BACK THE SAME DAY AS LONG AS YOU CALL BEFORE 4:00 PM  At the [facility] Clinic, you and your health needs are our priority. As part of our continuing mission to provide you with exceptional heart care, we have created designated Provider Care Teams. These Care Teams include your primary Cardiologist (physician) and Advanced Practice Providers (APPs- Physician Assistants and Nurse Practitioners) who all work together to provide you with the care you need, when you need it.   You may see any of the following providers on your designated Care Team at your next follow up: Dr Arvilla Meres Dr Marca Ancona Dr. Marcos Eke, NP Robbie Lis, [place] [facility] [place], [place] Brynda Peon, NP Karle Plumber, PharmD   Please be sure to bring in  all your medications bottles to every appointment.    Thank you for choosing [facility]

## 2023-04-13 NOTE — Progress Notes (Signed)
Paramedicine Encounter   Patient ID: Timothy Lambert , male,   DOB: [birth year],67 y.o.,  MRN: [id number]   Met patient in clinic today with provider.  Weight @ clinic-152 B/P-140/88 P-98 [id number]  Med changes-none today-he needs to take like he's suppose to  He will need ECHO around nov.   Dr Shirlee Latch suggested to add on medication b/c bp high today, he said either to take meds like he is suppose to or add on med so pt said he would just take like he's suppose to.  He only has taken 2 am doses this past week.  He missed his PCP appoint a couple wks ago. I advised him to call them back to resch. He thought the phone call from [facility]  was to replace his PCP visit. I explained that was not to replace an in person visit and that was just from his insurance.     Kerry Hough, EMT-Paramedic [phone] 04/13/2023

## 2023-04-14 ENCOUNTER — Telehealth (HOSPITAL_COMMUNITY): Payer: Self-pay | Admitting: *Deleted

## 2023-04-14 DIAGNOSIS — E782 Mixed hyperlipidemia: Secondary | ICD-10-CM

## 2023-04-14 DIAGNOSIS — I5022 Chronic systolic (congestive) heart failure: Secondary | ICD-10-CM

## 2023-04-14 NOTE — Telephone Encounter (Signed)
Called patient per Dr. Shirlee Latch with following lab results and instructions:  "Goal LDL < 55.  If he is taking his atorvastatin 80 mg daily regularly, would recommend adding Zetia 10 mg daily with lipids in 2 months."  Pt says he is not taking atorvastatin every day, "just when I feel like it". He will start taking daily and will return in 2 months for re-check of his lipids. He will be NPO for this test. Lab scheduled and ordered. Pt will call us at [phone] if any questions or concerns prior to next visit.

## 2023-04-16 ENCOUNTER — Other Ambulatory Visit: Payer: Self-pay | Admitting: Internal Medicine

## 2023-04-16 ENCOUNTER — Other Ambulatory Visit: Payer: Self-pay

## 2023-04-16 DIAGNOSIS — I2510 Atherosclerotic heart disease of native coronary artery without angina pectoris: Secondary | ICD-10-CM

## 2023-04-16 MED ORDER — ATORVASTATIN CALCIUM 80 MG PO TABS
80.0000 mg | ORAL_TABLET | Freq: Every day | ORAL | 0 refills | Status: DC
  Filled 2023-04-16: qty 90, 90d supply, fill #0

## 2023-04-16 NOTE — Telephone Encounter (Signed)
Requested Prescriptions  Pending Prescriptions Disp Refills   atorvastatin (LIPITOR) 80 MG tablet 90 tablet 0    Sig: Take 1 tablet (80 mg total) by mouth daily.     Cardiovascular:  Antilipid - Statins Failed - 04/16/2023  2:15 PM      Failed - Lipid Panel in normal range within the last 12 months    Cholesterol, Total  Date Value Ref Range Status  09/05/2021 122 100 - 199 mg/dL Final   Cholesterol  Date Value Ref Range Status  04/13/2023 128 0 - 200 mg/dL Final   LDL Chol Calc (NIH)  Date Value Ref Range Status  09/05/2021 68 0 - 99 mg/dL Final   LDL Cholesterol  Date Value Ref Range Status  04/13/2023 75 0 - 99 mg/dL Final    Comment:           Total Cholesterol/HDL:CHD Risk Coronary Heart Disease Risk Table                     Men   Women  1/2 Average Risk   3.4   3.3  Average Risk       5.0   4.4  2 X Average Risk   9.6   7.1  3 X Average Risk  23.4   11.0        Use the calculated Patient Ratio above and the CHD Risk Table to determine the patient's CHD Risk.        ATP III CLASSIFICATION (LDL):  <100     mg/dL   Optimal  478-295  mg/dL   Near or Above                    Optimal  130-159  mg/dL   Borderline  621-308  mg/dL   High  >657     mg/dL   Very High Performed at [facility], [address], [place], [place] [address]    HDL  Date Value Ref Range Status  04/13/2023 28 (L) >40 mg/dL Final  29/52/8413 33 (L) >39 mg/dL Final   Triglycerides  Date Value Ref Range Status  04/13/2023 125 <150 mg/dL Final         Passed - Patient is not pregnant      Passed - Valid encounter within last 12 months    Recent Outpatient Visits           8 months ago Type 2 diabetes mellitus with diabetic nephropathy, with long-term current use of insulin (HCC)   [facility] [name] B, MD   1 year ago Hypertension associated with stage 5 chronic kidney disease due to type 2 diabetes mellitus [facility])   [facility] Drucilla Chalet, RPH-CPP   1 year ago Hospital discharge follow-up   [facility] [name] B, MD   1 year ago Type 2 diabetes mellitus with stage 3b chronic kidney disease, with long-term current use of insulin (HCC)   [facility] [place], PA-C   1 year ago Type 2 diabetes mellitus with stage 3b chronic kidney disease, with long-term current use of insulin [facility])   [facility] Marcine Matar, MD

## 2023-04-20 ENCOUNTER — Other Ambulatory Visit: Payer: Self-pay

## 2023-04-20 ENCOUNTER — Telehealth: Payer: Self-pay | Admitting: Pharmacist

## 2023-04-20 ENCOUNTER — Ambulatory Visit: Payer: Self-pay

## 2023-04-20 NOTE — Patient Instructions (Addendum)
Visit Information  Thank you for taking time to visit with me today. Please don't hesitate to contact me if I can be of assistance to you.   Following are the goals we discussed today:   Goals Addressed             This Visit's Progress    To start using Dexcom continuous glucose monitoring       Care Coordination Interventions: Provided education to patient about basic DM disease process Reviewed medications with patient and discussed importance of medication adherence Review of patient status, including review of consultants reports, relevant laboratory and other test results, and medications completed Discussed with patient he will contact Butch Penny Pharm D to get help with applying the Dexcom sensor Determined patient is having financial difficulty paying for Eliquis, sent message to Allie Dimmer D who is working on assisting patient, patient needs to provide his pharmacy expense report Sent secure message to [employer] D regarding patient's need to learn how to use his Dexcom, he continue to need help with cost of Eliquis Reply from [employer] [facility] D advised patient is scheduled for an in person visit on 04/23/23 to learn how to use his Dexcom, patient's wife agreed to bring in a copy of their pharmacy expense report in order to receive assistance for Eliquis Mailed printed diabetes educational materials for patient review and discussion at next scheduled nurse call  Lab Results  Component Value Date   HGBA1C 10.8 (A) 07/30/2022          Our next appointment is by telephone on 05/18/23 at 09:00 AM  Please call the care guide team at [phone] if you need to cancel or reschedule your appointment.   If you are experiencing a Mental Health or Behavioral Health Crisis or need someone to talk to, please call 1-800-273-TALK (toll free, 24 hour hotline)  Patient verbalizes understanding of instructions and care plan provided today and agrees to view in  MyChart. Active MyChart status and patient understanding of how to access instructions and care plan via MyChart confirmed with patient.     Delsa Sale, RN, BSN, CCM Care Management Coordinator [facility] Care Management Direct Phone: [phone]

## 2023-04-20 NOTE — Patient Outreach (Addendum)
  Care Coordination   Initial Visit Note   04/20/2023 Name: Timothy Lambert MRN: [id number] DOB: March 13, 1956  COLYN ANGRISANI is a 67 y.o. year old male who sees Marcine Matar, MD for primary care. I spoke with  Benny Lennert by phone today.  What matters to the patients health and wellness today?  Patient will contact Allie Dimmer D to get assistance with applying his Dexcom sensor.     Goals Addressed             This Visit's Progress    To start using Dexcom continuous glucose monitoring       Care Coordination Interventions: Provided education to patient about basic DM disease process Reviewed medications with patient and discussed importance of medication adherence Review of patient status, including review of consultants reports, relevant laboratory and other test results, and medications completed Discussed with patient he will contact Butch Penny Pharm D to get help with applying the Dexcom sensor Determined patient is having financial difficulty paying for Eliquis, sent message to Allie Dimmer D who is working on assisting patient, patient needs to provide his pharmacy expense report Sent secure message to [employer] Pharm D regarding patient's need to learn how to use his Dexcom, he continue to need help with cost of Eliquis                Reply from [employer] Pharm D advised patient is scheduled for an in person    visit on 04/23/23 to learn how to use his Dexcom, patient's wife agreed to bring in a copy of their pharmacy expense report in order to receive assistance for Eliquis Mailed printed diabetes educational materials for patient review and discussion at next scheduled nurse call  Lab Results  Component Value Date   HGBA1C 10.8 (A) 07/30/2022      Interventions Today    Flowsheet Row Most Recent Value  Chronic Disease   Chronic disease during today's visit Diabetes, Hypertension (HTN), Chronic Kidney Disease/End Stage Renal Disease (ESRD),  Congestive Heart Failure (CHF)  General Interventions   General Interventions Discussed/Reviewed General Interventions Discussed, General Interventions Reviewed, Doctor Visits, Durable Medical Equipment (DME), Communication with  Durable Medical Equipment (DME) Glucomoter  Communication with Pharmacists  [place]]  Education Interventions   Education Provided Provided Education, Provided Printed Education  Provided Verbal Education On Blood Sugar Monitoring, Medication  Pharmacy Interventions   Pharmacy Dicussed/Reviewed Pharmacy Topics Discussed, Pharmacy Topics Reviewed, Affording Medications, Referral to Pharmacist  Referral to Pharmacist Cannot afford medications  [sent secure message to [employer] D regarding pt having difficulty paying for Eliquis]          SDOH assessments and interventions completed:  No     Care Coordination Interventions:  Yes, provided   Follow up plan: Referral made to [facility] Pharm D to assist with cost of Eliquis  Follow up call scheduled for 05/18/23 @09 :00 AM    Encounter Outcome:  Pt. Visit Completed

## 2023-04-20 NOTE — Telephone Encounter (Signed)
Called and spoke to patient and his wife, Ms. Timothy Lambert. Briefly, we have been working to get information from him that is required to complete the MAP application for Eliquis assistance. I finally spoke to his wife to let her know we will need this information from her:   - Medication expense report from her pharmacy, [facility].  She plans to get this report and provide this to [place] Friday morning (04/23/23). At that time, Timothy Lambert will also bring his Dexcom to be shown how to use this.

## 2023-04-21 ENCOUNTER — Telehealth (HOSPITAL_COMMUNITY): Payer: Self-pay

## 2023-04-21 ENCOUNTER — Telehealth (HOSPITAL_COMMUNITY): Payer: Self-pay | Admitting: Licensed Clinical Social Worker

## 2023-04-21 NOTE — Telephone Encounter (Signed)
Pt called me this morning to cancel appointment reporting his stomach is upset. He does have PCP appoint at end of week.    Kerry Hough, EMT-Paramedic  [phone] 04/21/2023

## 2023-04-21 NOTE — Telephone Encounter (Signed)
HF Paramedicine Team Based Care Meeting  Three Months Post Enrollment Assessment  HF MD- NA  HF NP - Amy Clegg NP-C   [facility]  Katie Vicente Males  [facility] admit within the last 30 days for heart failure? no  Medications concerns? Not being compliant with medications- admits to not taking medications.   Barriers to discharge? Continued medical concerns due to medication compliance concerns.   Burna Sis, LCSW Clinical Social Worker Advanced [facility] Failure Clinic Desk#: [phone] Cell#: [phone]

## 2023-04-23 ENCOUNTER — Ambulatory Visit: Payer: Medicare Other | Attending: Family Medicine | Admitting: Pharmacist

## 2023-04-23 ENCOUNTER — Other Ambulatory Visit: Payer: Self-pay

## 2023-04-23 ENCOUNTER — Other Ambulatory Visit (HOSPITAL_COMMUNITY): Payer: Self-pay

## 2023-04-23 DIAGNOSIS — Z7189 Other specified counseling: Secondary | ICD-10-CM

## 2023-04-23 NOTE — Progress Notes (Signed)
Patient was educated on the use of the Dexcom G7 blood glucose meter. Reviewed necessary supplies and operation of the meter. Also reviewed goal blood glucose levels. Patient was able to demonstrate use. All questions and concerns were addressed.  Time spent face-to-face counseling: 20 minutes  Follow-up: prn   Butch Penny, PharmD, [place], CPP Clinical Pharmacist [facility] [phone]

## 2023-04-28 ENCOUNTER — Telehealth (HOSPITAL_COMMUNITY): Payer: Self-pay

## 2023-04-28 NOTE — Telephone Encounter (Signed)
Pt called to cancel our appointment for this morning.  He said he had things to do and couldn't meet today.  He wants to plan to meet next week.   Kerry Hough, EMT-Paramedic  [phone] 04/28/2023

## 2023-04-30 ENCOUNTER — Other Ambulatory Visit: Payer: Self-pay

## 2023-05-05 ENCOUNTER — Telehealth (HOSPITAL_COMMUNITY): Payer: Self-pay

## 2023-05-05 ENCOUNTER — Other Ambulatory Visit: Payer: Self-pay

## 2023-05-05 NOTE — Telephone Encounter (Signed)
Spoke to pt to verify he is able to meet this morning, he reports he has come down with a cold and unable to meet again today.  He agreed to visit next week at same time/day.    Kerry Hough, EMT-Paramedic  [phone] 05/05/2023

## 2023-05-12 ENCOUNTER — Other Ambulatory Visit (HOSPITAL_COMMUNITY): Payer: Self-pay

## 2023-05-12 NOTE — Progress Notes (Unsigned)
Paramedicine Encounter    Patient ID: Timothy Lambert, male    DOB: 1956/04/04, 67 y.o.   MRN: [id number]   Complaints-none  Edema-none   Compliance with meds-getting better-missing maybe 1-2 days   Pill box filled-already filled  If so, by whom-the pt   Refills needed-none right now   It has been a few wks since he has been able to keep appoint with me. He has cancelled the last 3X. So glad he kept his appoint today.   Pt reports that he is doing good. Pt denies c/p, no sob, no dizziness, no palpitations.  He went to PCP office to meet with pharmacy team to go over his dexcom. He is wearing   it today and so far seems to like it.   He also has been approved to get his eliquis for free and he said he received a 90 day supply in the mail.  Does have the new eliquis bottle.  The bidil he still has that same bottle.   He is doing better with med compliance it seems. He still missing some but it has improved. I did go over him again his meds and reviewed what they do for him and help treat.  When he does miss meds its usually the bed time and he feels like he doesn't have to take it BID. So I explained to him that as well-different meds can last 24hrs vs other meds.   He took his med this morning around 8am, he did miss last nights dose.   Appetite good. No edema noted.    CBG's 140-160s BP (!) 142/86   Pulse 90   Resp 16   Wt 154 lb (69.9 kg)   SpO2 98%   BMI 24.12 kg/m  Weight yesterday-? Last visit weight-152  Patient Care Team: Marcine Matar, MD as PCP - General (Internal Medicine) Clarene Duke Karma Lew, RN as [facility] Care Management  Patient Active Problem List   Diagnosis Date Noted  . Anemia due to stage 5 chronic kidney disease, not on chronic dialysis (HCC) 02/12/2022  . Hypertension associated with stage 5 chronic kidney disease due to type 2 diabetes mellitus (HCC) 02/12/2022  . Anemia 01/22/2022  . Lobar pneumonia (HCC) 01/21/2022  .  Alcohol abuse 01/21/2022  . Acute on chronic systolic CHF (congestive heart failure) (HCC) 01/16/2022  . Influenza vaccine refused 09/09/2020  . Type 2 diabetes mellitus with hyperlipidemia (HCC) 06/29/2020  . Acute kidney injury superimposed on chronic kidney disease (HCC) 02/15/2018  . Macroalbuminuric diabetic nephropathy (HCC) 02/15/2018  . Diabetic retinopathy (HCC) 11/18/2015  . Paroxysmal atrial fibrillation (HCC) 10/28/2015  . Cognitive deficit, post-stroke 05/13/2015  . Essential hypertension 02/19/2015  . 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 CVA (cerebral vascular accident) (HCC)   . Non-ST elevation (NSTEMI) myocardial infarction (HCC)   . Combined systolic and diastolic congestive heart failure (HCC)   . Hyperlipidemia     Current Outpatient Medications:  .  acetaminophen (TYLENOL) 325 MG tablet, Take 2 tablets (650 mg total) by mouth every 6 (six) hours as needed for mild pain (or Fever >/= 101)., Disp: , Rfl:  .  apixaban (ELIQUIS) 5 MG TABS tablet, Take 1 tablet (5 mg total) by mouth 2 (two) times daily. (Patient taking differently: Take 5 mg by mouth daily.), Disp: 180 tablet, Rfl: 1 .  atorvastatin (LIPITOR) 80 MG tablet, Take 1 tablet (80 mg total) by mouth daily., Disp: 90 tablet, Rfl: 0 .  carvedilol (  COREG) 25 MG tablet, Take 1 tablet (25 mg total) by mouth 2 (two) times daily., Disp: 60 tablet, Rfl: 11 .  Continuous Glucose Receiver (DEXCOM G7 RECEIVER) DEVI, Use to check blood sugar three times daily. Change sensors once every 10 days. E11.21, Disp: 1 each, Rfl: 0 .  Continuous Glucose Sensor (DEXCOM G7 SENSOR) MISC, Use to check blood sugar three times daily. Change sensors once every 10 days. E11.21, Disp: 3 each, Rfl: 6 .  furosemide (LASIX) 20 MG tablet, Take 2 tablets (40 mg total) by mouth as needed. For swelling or weight gain of 3lb in 24 hours or 5lb in a week, Disp: 180 tablet, Rfl: 1 .  glucose blood (ACCU-CHEK GUIDE) test strip, Use as instructed  3 times daily DX code E11.9, Disp: 100 each, Rfl: 3 .  insulin lispro (HUMALOG KWIKPEN) 100 UNIT/ML KwikPen, Inject 2 Units into the skin 2 (two) times daily before lunch and supper., Disp: 15 mL, Rfl: 11 .  Insulin Pen Needle (TECHLITE PEN NEEDLES) 32G X 4 MM MISC, Use as directed for insulin, Disp: 100 each, Rfl: 5 .  Insulin Syringe-Needle U-100 (INSULIN SYRINGE .5CC/30GX1/2") 30G X 1/2" 0.5 ML MISC, Check blood sugar TID & QHS, Disp: 100 each, Rfl: 2 .  isosorbide-hydrALAZINE (BIDIL) 20-37.5 MG tablet, Take 2 tablets by mouth 3 (three) times daily., Disp: 270 tablet, Rfl: 3 .  Lancets (ACCU-CHEK SOFT TOUCH) lancets, Use as instructed, Disp: 100 each, Rfl: 12 .  TRUEPLUS LANCETS 28G MISC, 1 each by Does not apply route 3 (three) times daily before meals., Disp: 100 each, Rfl: 12 Allergies  Allergen Reactions  . Trulicity [Dulaglutide]     Upset his stomach      Social History   Socioeconomic History  . Marital status: Married    Spouse name: Not on file  . Number of children: Not on file  . Years of education: Not on file  . Highest education level: Not on file  Occupational History  . Not on file  Tobacco Use  . Smoking status: Former    Current packs/day: 0.33    Average packs/day: 0.3 packs/day for 14.0 years (4.6 ttl pk-yrs)    Types: Cigarettes  . Smokeless tobacco: Never  . Tobacco comments:    "quit smoking cigarettes in 1992"  Substance and Sexual Activity  . Alcohol use: No    Comment: 02/19/2015 "stopped drinking 01/22/2015"  . Drug use: No  . Sexual activity: Not Currently  Other Topics Concern  . Not on file  Social History Narrative   Lives   Caffeine use: Coffee sometimes   Social Determinants of Health   Financial Resource Strain: Not on file  Food Insecurity: No Food Insecurity (01/08/2023)   Hunger Vital Sign   . Worried About Programme researcher, broadcasting/film/video in the Last Year: Never true   . Ran Out of Food in the Last Year: Never true  Transportation Needs:  Unknown (09/04/2022)   PRAPARE - Transportation   . Lack of Transportation (Medical): Not on file   . Lack of Transportation (Non-Medical): No  Physical Activity: Not on file  Stress: Not on file  Social Connections: Not on file  Intimate Partner Violence: Not on file    Physical Exam      Future Appointments  Date Time Provider Department Center  05/18/2023  9:00 AM Little, Karma Lew, RN [facility] None  07/15/2023  9:00 AM [facility] LAB [facility] None  07/29/2023  9:00 AM MC ECHO OP 1  [facility]  07/29/2023 10:00 AM [facility] PA/NP [facility] None       Kerry Hough, Paramedic [phone] [facility] Paramedic  05/12/23

## 2023-05-18 ENCOUNTER — Ambulatory Visit: Payer: Self-pay

## 2023-05-18 NOTE — Patient Outreach (Signed)
  Care Coordination   Follow Up Visit Note   05/18/2023 Name: Timothy Lambert MRN: [id number] DOB: [birth year]  Timothy Lambert is a 67 y.o. year old male who sees Marcine Matar, MD for primary care. I spoke with  Timothy Lambert by phone today.  What matters to the patients health and wellness today?  Patient would like to continue using his Dexcom for continuous glucose monitoring.     Goals Addressed             This Visit's Progress    To lower BP readings to goal BP <130/80       Care Coordination Interventions: Evaluation of current treatment plan related to hypertension self management and patient's adherence to plan as established by provider Reviewed medications with patient and assessed for medication adherence  Discussed plans with patient for ongoing care coordination follow up and provided patient with direct contact information for care coordination nurse Last practice recorded BP readings:  BP Readings from Last 3 Encounters:  05/12/23 (!) 142/86  04/13/23 (!) 140/88  04/07/23 130/88   Most recent eGFR/CrCl:  Lab Results  Component Value Date   EGFR 16 (L) 02/12/2022    No components found for: "CRCL"      To start using Dexcom continuous glucose monitoring   On track    Care Coordination Interventions: Evaluation of current treatment plan related to type 2 diabetes and patient's adherence to plan as established by provider Determined patient completed his Dexcom training with Emelda Fear D Reviewed and discussed patient is learning a lot regarding how certain foods he eats effects his blood sugars Encouraged patient to use portion control and avoid eating high carb foods that may raise his sugars and patient verbalizes understanding  Lab Results  Component Value Date   HGBA1C 10.8 (A) 07/30/2022      Interventions Today    Flowsheet Row Most Recent Value  Chronic Disease   Chronic disease during today's visit Diabetes, Congestive  Heart Failure (CHF), Hypertension (HTN)  General Interventions   General Interventions Discussed/Reviewed General Interventions Discussed, General Interventions Reviewed, Doctor Visits  Doctor Visits Discussed/Reviewed Doctor Visits Discussed, Doctor Visits Reviewed, Specialist, PCP  Education Interventions   Education Provided Provided Education  Provided Verbal Education On Blood Sugar Monitoring, Nutrition, Medication, When to see the doctor  Nutrition Interventions   Nutrition Discussed/Reviewed Nutrition Discussed, Nutrition Reviewed, Portion sizes  Pharmacy Interventions   Pharmacy Dicussed/Reviewed Pharmacy Topics Discussed, Pharmacy Topics Reviewed, Medication Adherence         SDOH assessments and interventions completed:  No     Care Coordination Interventions:  Yes, provided   Follow up plan: Follow up call scheduled for 06/18/23 @09 :00 AM    Encounter Outcome:  Pt. Visit Completed

## 2023-05-18 NOTE — Patient Instructions (Signed)
Visit Information  Thank you for taking time to visit with me today. Please don't hesitate to contact me if I can be of assistance to you.   Following are the goals we discussed today:   Goals Addressed             This Visit's Progress    To lower BP readings to goal BP <130/80       Care Coordination Interventions: Evaluation of current treatment plan related to hypertension self management and patient's adherence to plan as established by provider Reviewed medications with patient and assessed for medication adherence  Discussed plans with patient for ongoing care coordination follow up and provided patient with direct contact information for care coordination nurse Last practice recorded BP readings:  BP Readings from Last 3 Encounters:  05/12/23 (!) 142/86  04/13/23 (!) 140/88  04/07/23 130/88   Most recent eGFR/CrCl:  Lab Results  Component Value Date   EGFR 16 (L) 02/12/2022    No components found for: "CRCL"      To start using Dexcom continuous glucose monitoring   On track    Care Coordination Interventions: Evaluation of current treatment plan related to type 2 diabetes and patient's adherence to plan as established by provider Determined patient completed his Dexcom training with Emelda Fear D Reviewed and discussed patient is learning a lot regarding how certain foods he eats effects his blood sugars Encouraged patient to use portion control and avoid eating high carb foods that may raise his sugars and patient verbalizes understanding  Lab Results  Component Value Date   HGBA1C 10.8 (A) 07/30/2022         Our next appointment is by telephone on 06/18/23 at 09:00 AM  Please call the care guide team at [phone] if you need to cancel or reschedule your appointment.   If you are experiencing a Mental Health or Behavioral Health Crisis or need someone to talk to, please call 1-800-273-TALK (toll free, 24 hour hotline)  Patient verbalizes  understanding of instructions and care plan provided today and agrees to view in MyChart. Active MyChart status and patient understanding of how to access instructions and care plan via MyChart confirmed with patient.     Delsa Sale, RN, BSN, CCM Care Management Coordinator [facility] Care Management Direct Phone: [phone]

## 2023-05-19 ENCOUNTER — Other Ambulatory Visit (HOSPITAL_COMMUNITY): Payer: Self-pay

## 2023-05-19 NOTE — Progress Notes (Signed)
Came out to meet him at the fire station as agreed from last week.   I waited for approx and he did not show up.  No answer when I called him.   Kerry Hough, EMT-Paramedic  [phone] 05/19/2023

## 2023-06-01 ENCOUNTER — Telehealth (HOSPITAL_COMMUNITY): Payer: Self-pay

## 2023-06-02 NOTE — Telephone Encounter (Signed)
Attempted to reach pt to see if he is able to meet me tomor at the fire station but his phone had busy signal numerous times that I called.     Kerry Hough, EMT-Paramedic  [phone] 06/01/2023

## 2023-06-08 ENCOUNTER — Telehealth (HOSPITAL_COMMUNITY): Payer: Self-pay

## 2023-06-09 NOTE — Telephone Encounter (Signed)
I reached out to pt to see if he was able to meet tomor.  He reports he is not able to and he needs a break.  I asked him if he was not interested anymore and he said he just needed a break.  I asked for how long b/c that will determine if he is d/c from paramedicine and has to go the re-referral route again or if just wants to wait a couple more wks.  He agreed for me to call him in 2 wks to check in and go from there.  He has cancelled the last week and the last visit was on 8/28.    Kerry Hough, EMT-Paramedic  [phone] 06/08/2023

## 2023-06-18 ENCOUNTER — Ambulatory Visit: Payer: Self-pay

## 2023-06-18 NOTE — Patient Outreach (Signed)
Care Coordination   06/18/2023 Name: Timothy Lambert MRN: [id number] DOB: 03-21-1956   Care Coordination Outreach Attempts:  An unsuccessful telephone outreach was attempted for a scheduled appointment today.  Follow Up Plan:  Additional outreach attempts will be made to offer the patient care coordination information and services.   Encounter Outcome:  Patient Refused   Care Coordination Interventions:  No, not indicated    Raye Sorrow CCM [place]  [facility] Health Nurse Care Coordinator  Direct Dial: [phone] Website: [email or web address]

## 2023-06-23 ENCOUNTER — Other Ambulatory Visit (HOSPITAL_COMMUNITY): Payer: Self-pay

## 2023-06-23 NOTE — Progress Notes (Unsigned)
Paramedicine Encounter    Patient ID: Timothy Lambert, male    DOB: November 30, 1955, 67 y.o.   MRN: [id number]   Complaints-none   Edema-none  Compliance with meds-no missing numerous doses per week   Pill box filled-pt already filled it  If so, by whom-pt and I checked it   Refills needed-none   Its been quite some time that I have seen pt due to him rescheduling and him needing a break.  His weight is down, although he says his appetite is good and he eats good.  He does not like taking medicine. He has same pill bottles from months ago so its obvious he is not taking his meds daily.  He reports he is taking them 4x a week but I dont think that is accurate-I think its less than that.  He denies any symptoms.  We spoke at length about him taking his meds and what could happen if he continues to miss doses like this.   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  This person is from [facility] who calls monthly.  I told him either phone visits or in person visits he is the one that its up to in taking his meds as prescribed.  I told him I was concerned with him having a serious medical emergency if he continued missing so many doses.  [place] fire de[place] to [place] living so far outside of the [employer].  He reports not having his meds yet this morning due to him not eating yet, just had some coffee. He does have f/u appoint and echo in nov. I will try to hold onto him until then but I am not sure he is going to allow continued visits.  Will continue to follow and try.      CBG's 140s BP (!) 170/98   Pulse 98   Resp 16   Wt 142 lb (64.4 kg)   SpO2 98%   BMI 22.24 kg/m  Weight yesterday-142 Last visit weight-154  Patient Care Team: Marcine Matar, MD as PCP - General (Internal Medicine) Clarene Duke Karma Lew,  RN as [facility] Care Management  Patient Active Problem List   Diagnosis Date Noted   Anemia due to stage 5 chronic kidney disease, not on chronic dialysis (HCC) 02/12/2022   Hypertension associated with stage 5 chronic kidney disease due to type 2 diabetes mellitus (HCC) 02/12/2022   Anemia 01/22/2022   Lobar pneumonia (HCC) 01/21/2022   Alcohol abuse 01/21/2022   Acute on chronic systolic CHF (congestive heart failure) (HCC) 01/16/2022   Influenza vaccine refused 09/09/2020   Type 2 diabetes mellitus with hyperlipidemia (HCC) 06/29/2020   Acute kidney injury superimposed on chronic kidney disease (HCC) 02/15/2018   Macroalbuminuric diabetic nephropathy (HCC) 02/15/2018   Diabetic retinopathy (HCC) 11/18/2015   Paroxysmal atrial fibrillation (HCC) 10/28/2015   Cognitive deficit, post-stroke 05/13/2015   Essential hypertension 02/19/2015   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   CVA (cerebral vascular accident) (HCC)    Non-ST elevation (NSTEMI) myocardial infarction (HCC)    Combined systolic and diastolic congestive heart failure (HCC)    Hyperlipidemia     Current Outpatient Medications:    acetaminophen (TYLENOL) 325 MG tablet, Take 2 tablets (650 mg total) by  mouth every 6 (six) hours as needed for mild pain (or Fever >/= 101)., Disp: , Rfl:    apixaban (ELIQUIS) 5 MG TABS tablet, Take 1 tablet (5 mg total) by mouth 2 (two) times daily., Disp: 180 tablet, Rfl: 1   atorvastatin (LIPITOR) 80 MG tablet, Take 1 tablet (80 mg total) by mouth daily., Disp: 90 tablet, Rfl: 0   carvedilol (COREG) 25 MG tablet, Take 1 tablet (25 mg total) by mouth 2 (two) times daily., Disp: 60 tablet, Rfl: 11   Continuous Glucose Receiver (DEXCOM G7 RECEIVER) DEVI, Use to check blood sugar three times daily. Change sensors once every 10 days. E11.21, Disp: 1 each, Rfl: 0   Continuous Glucose Sensor (DEXCOM G7 SENSOR) MISC, Use to check blood sugar three times daily. Change sensors  once every 10 days. E11.21, Disp: 3 each, Rfl: 6   glucose blood (ACCU-CHEK GUIDE) test strip, Use as instructed 3 times daily DX code E11.9, Disp: 100 each, Rfl: 3   insulin lispro (HUMALOG KWIKPEN) 100 UNIT/ML KwikPen, Inject 2 Units into the skin 2 (two) times daily before lunch and supper., Disp: 15 mL, Rfl: 11   Insulin Syringe-Needle U-100 (INSULIN SYRINGE .5CC/30GX1/2") 30G X 1/2" 0.5 ML MISC, Check blood sugar TID & QHS, Disp: 100 each, Rfl: 2   isosorbide-hydrALAZINE (BIDIL) 20-37.5 MG tablet, Take 2 tablets by mouth 3 (three) times daily., Disp: 270 tablet, Rfl: 3   Lancets (ACCU-CHEK SOFT TOUCH) lancets, Use as instructed, Disp: 100 each, Rfl: 12   TRUEPLUS LANCETS 28G MISC, 1 each by Does not apply route 3 (three) times daily before meals., Disp: 100 each, Rfl: 12   furosemide (LASIX) 20 MG tablet, Take 2 tablets (40 mg total) by mouth as needed. For swelling or weight gain of 3lb in 24 hours or 5lb in a week (Patient not taking: Reported on 06/24/2023), Disp: 180 tablet, Rfl: 1 Allergies  Allergen Reactions   Trulicity [Dulaglutide]     Upset his stomach      Social History   Socioeconomic History   Marital status: Married    Spouse name: Not on file   Number of children: Not on file   Years of education: Not on file   Highest education level: Not on file  Occupational History   Not on file  Tobacco Use   Smoking status: Former    Current packs/day: 0.33    Average packs/day: 0.3 packs/day for 14.0 years (4.6 ttl pk-yrs)    Types: Cigarettes   Smokeless tobacco: Never   Tobacco comments:    "quit smoking cigarettes in 1992"  Substance and Sexual Activity   Alcohol use: No    Comment: 02/19/2015 "stopped drinking 01/22/2015"   Drug use: No   Sexual activity: Not Currently  Other Topics Concern   Not on file  Social History Narrative   Lives   Caffeine use: Coffee sometimes   Social Determinants of Health   Financial Resource Strain: Not on file  Food  Insecurity: No Food Insecurity (01/08/2023)   Hunger Vital Sign    Worried About Running Out of Food in the Last Year: Never true    Ran Out of Food in the Last Year: Never true  Transportation Needs: Unknown (09/04/2022)   PRAPARE - Administrator, Civil Service (Medical): Not on file    Lack of Transportation (Non-Medical): No  Physical Activity: Not on file  Stress: Not on file  Social Connections: Not on file  Intimate Partner Violence:  Not on file    Physical Exam      Future Appointments  Date Time Provider Department Center  07/09/2023 12:30 PM Riley Churches, RN [facility] None  07/15/2023  9:00 AM [facility] LAB [facility] None  07/29/2023  9:00 AM [facility] ECHO OP 1 [facility]  07/29/2023 10:00 AM [facility] PA/NP [facility] None       Kerry Hough, Paramedic [phone] [facility] Paramedic  06/24/23

## 2023-06-30 ENCOUNTER — Other Ambulatory Visit (HOSPITAL_COMMUNITY): Payer: Self-pay

## 2023-07-06 ENCOUNTER — Ambulatory Visit: Payer: Medicare Other | Attending: Internal Medicine

## 2023-07-06 VITALS — Ht 67.0 in | Wt 142.0 lb

## 2023-07-06 DIAGNOSIS — Z0000 Encounter for general adult medical examination without abnormal findings: Secondary | ICD-10-CM | POA: Diagnosis not present

## 2023-07-06 NOTE — Patient Instructions (Signed)
[name] , Thank you for taking time to come for your Medicare Wellness Visit. I appreciate your ongoing commitment to your health goals. Please review the following plan we discussed and let me know if I can assist you in the future.   Referrals/Orders/Follow-Ups/Clinician Recommendations: Aim for 30 minutes of exercise or brisk walking, 6-8 glasses of water, and 5 servings of fruits and vegetables each day.  This is a list of the screening recommended for you and due dates:  Health Maintenance  Topic Date Due   DTaP/Tdap/Td vaccine (1 - Tdap) Never done   Colon Cancer Screening  Never done   Pneumonia Vaccine (2 of 2 - PCV) 11/26/2021   Complete foot exam   09/05/2022   Yearly kidney health urinalysis for diabetes  01/18/2023   Hemoglobin A1C  01/28/2023   COVID-19 Vaccine (3 - 2023-24 season) 05/23/2023   Eye exam for diabetics  07/31/2023*   Zoster (Shingles) Vaccine (1 of 2) 07/31/2023*   Flu Shot  12/20/2023*   Yearly kidney function blood test for diabetes  04/12/2024   Medicare Annual Wellness Visit  07/05/2024   Hepatitis C Screening  Completed   HPV Vaccine  Aged Out  *Topic was postponed. The date shown is not the original due date.    Advanced directives: (ACP Link)Information on Advanced Care Planning can be found at [facility] Advance Health Care Directives Advance Health Care Directives ([email or web address]   Next Medicare Annual Wellness Visit scheduled for next year: Yes

## 2023-07-06 NOTE — Progress Notes (Signed)
Subjective:   Timothy Lambert is a 67 y.o. male who presents for Medicare Annual/Subsequent preventive examination.  Visit Complete: Virtual I connected with  Benny Lennert on 07/06/23 by a audio enabled telemedicine application and verified that I am speaking with the correct person using two identifiers.  Patient Location: Home  Provider Location: Home Office  I discussed the limitations of evaluation and management by telemedicine. The patient expressed understanding and agreed to proceed.  Vital Signs: Because this visit was a virtual/telehealth visit, some criteria may be missing or patient reported. Any vitals not documented were not able to be obtained and vitals that have been documented are patient reported.  Cardiac Risk Factors include: advanced age (>61men, >23 women);hypertension;male gender;diabetes mellitus     Objective:    Today's Vitals   07/06/23 1659  Weight: 142 lb (64.4 kg)  Height: 5\' 7"  (1.702 m)   Body mass index is 22.24 kg/m.     07/06/2023    5:06 PM 09/04/2022   11:13 AM 01/19/2022    8:00 AM 01/15/2022   10:27 PM 06/04/2021   10:19 AM 05/28/2020    4:10 PM 05/17/2020    5:29 PM  Advanced Directives  Does Patient Have a Medical Advance Directive? No Yes  Yes Yes Yes Yes  Type of Best boy of [place];Living will Healthcare Power of [place];Living will Healthcare Power of [place];Living will Healthcare Power of Attorney Living will  Does patient want to make changes to medical advance directive?   No - Patient declined  No - Patient declined  No - Patient declined  Copy of Healthcare Power of Attorney in Chart?    No - copy requested, Physician notified No - copy requested No - copy requested   Would patient like information on creating a medical advance directive? Yes (MAU/Ambulatory/Procedural Areas - Information given)      No - Patient declined    Current Medications (verified) Outpatient Encounter Medications  as of 07/06/2023  Medication Sig   acetaminophen (TYLENOL) 325 MG tablet Take 2 tablets (650 mg total) by mouth every 6 (six) hours as needed for mild pain (or Fever >/= 101).   apixaban (ELIQUIS) 5 MG TABS tablet Take 1 tablet (5 mg total) by mouth 2 (two) times daily.   atorvastatin (LIPITOR) 80 MG tablet Take 1 tablet (80 mg total) by mouth daily.   carvedilol (COREG) 25 MG tablet Take 1 tablet (25 mg total) by mouth 2 (two) times daily.   Continuous Glucose Receiver (DEXCOM G7 RECEIVER) DEVI Use to check blood sugar three times daily. Change sensors once every 10 days. E11.21   Continuous Glucose Sensor (DEXCOM G7 SENSOR) MISC Use to check blood sugar three times daily. Change sensors once every 10 days. E11.21   glucose blood (ACCU-CHEK GUIDE) test strip Use as instructed 3 times daily DX code E11.9   insulin lispro (HUMALOG KWIKPEN) 100 UNIT/ML KwikPen Inject 2 Units into the skin 2 (two) times daily before lunch and supper.   Insulin Syringe-Needle U-100 (INSULIN SYRINGE .5CC/30GX1/2") 30G X 1/2" 0.5 ML MISC Check blood sugar TID & QHS   isosorbide-hydrALAZINE (BIDIL) 20-37.5 MG tablet Take 2 tablets by mouth 3 (three) times daily.   Lancets (ACCU-CHEK SOFT TOUCH) lancets Use as instructed   TRUEPLUS LANCETS 28G MISC 1 each by Does not apply route 3 (three) times daily before meals.   furosemide (LASIX) 20 MG tablet Take 2 tablets (40 mg total) by mouth as needed.  For swelling or weight gain of 3lb in 24 hours or 5lb in a week (Patient not taking: Reported on 06/24/2023)   No facility-administered encounter medications on file as of 07/06/2023.    Allergies (verified) Trulicity [dulaglutide]   History: Past Medical History:  Diagnosis Date   Acute renal failure (ARF) (HCC) 02/19/2015   CHF (congestive heart failure) (HCC)    Coronary artery disease    GERD (gastroesophageal reflux disease)    HLD (hyperlipidemia)    Hypertension    Myocardial infarction (HCC) 01/22/2015   "massive"    Refusal of blood transfusions as patient is Jehovah's Witness    Seizures (HCC) 01/22/2015   "in front of paramedics"   Stroke (HCC) 01/22/2015   "short term memory loss & right side weak since" (02/19/2015)   Type II diabetes mellitus (HCC)    Past Surgical History:  Procedure Laterality Date   CARDIAC CATHETERIZATION N/A 01/23/2015   Procedure: Left Heart Cath and Coronary Angiography;  Surgeon: Tonny Bollman, MD;  Location: [facility] INVASIVE CV LAB CUPID;  Service: Cardiovascular;  Laterality: N/A;   CARDIAC CATHETERIZATION N/A 01/23/2015   Procedure: Coronary Stent Intervention;  Surgeon: Tonny Bollman, MD;  Location: [facility] INVASIVE CV LAB CUPID;  Service: Cardiovascular;  Laterality: N/A;   Family History  Problem Relation Age of Onset   Diabetes Father    Seizures Neg Hx    Social History   Socioeconomic History   Marital status: Married    Spouse name: Not on file   Number of children: Not on file   Years of education: Not on file   Highest education level: Not on file  Occupational History   Not on file  Tobacco Use   Smoking status: Former    Current packs/day: 0.33    Average packs/day: 0.3 packs/day for 14.0 years (4.6 ttl pk-yrs)    Types: Cigarettes   Smokeless tobacco: Never   Tobacco comments:    "quit smoking cigarettes in 1992"  Substance and Sexual Activity   Alcohol use: No    Comment: 02/19/2015 "stopped drinking 01/22/2015"   Drug use: No   Sexual activity: Not Currently  Other Topics Concern   Not on file  Social History Narrative   Lives   Caffeine use: Coffee sometimes   Social Determinants of Health   Financial Resource Strain: Low Risk  (07/06/2023)   Overall Financial Resource Strain (CARDIA)    Difficulty of Paying Living Expenses: Not hard at all  Food Insecurity: No Food Insecurity (07/06/2023)   Hunger Vital Sign    Worried About Running Out of Food in the Last Year: Never true    Ran Out of Food in the Last Year: Never true  Transportation  Needs: No Transportation Needs (07/06/2023)   PRAPARE - Administrator, Civil Service (Medical): No    Lack of Transportation (Non-Medical): No  Physical Activity: Inactive (07/06/2023)   Exercise Vital Sign    Days of Exercise per Week: 0 days    Minutes of Exercise per Session: 0 min  Stress: No Stress Concern Present (07/06/2023)   [employer] of Occupational Health - Occupational Stress Questionnaire    Feeling of Stress : Not at all  Social Connections: Moderately Isolated (07/06/2023)   Social Connection and Isolation Panel [NHANES]    Frequency of Communication with Friends and Family: More than three times a week    Frequency of Social Gatherings with Friends and Family: Three times a week    Attends  Religious Services: Never    Active Member of Clubs or Organizations: No    Attends Banker Meetings: Never    Marital Status: Married    Tobacco Counseling Counseling given: Not Answered Tobacco comments: "quit smoking cigarettes in 1992"   Clinical Intake:  Pre-visit preparation completed: Yes  Pain : No/denies pain  Diabetes: Yes CBG done?: No Did pt. bring in CBG monitor from home?: No  How often do you need to have someone help you when you read instructions, pamphlets, or other written materials from your doctor or pharmacy?: 1 - Never  Interpreter Needed?: No  Information entered by :: Kandis Fantasia LPN   Activities of Daily Living    07/06/2023    5:00 PM 09/04/2022   11:25 AM  In your present state of health, do you have any difficulty performing the following activities:  Hearing? 0 0  Vision? 0 0  Difficulty concentrating or making decisions? 0 0  Walking or climbing stairs? 0 0  Dressing or bathing? 0 0  Doing errands, shopping? 0 0  Preparing Food and eating ? N N  Using the Toilet? N N  In the past six months, have you accidently leaked urine? N N  Do you have problems with loss of bowel control? N N  Managing  your Medications? N N  Managing your Finances? N   Housekeeping or managing your Housekeeping? N N    Patient Care Team: Marcine Matar, MD as PCP - General (Internal Medicine) Clarene Duke Karma Lew, RN as [facility] Care Management  Indicate any recent Medical Services you may have received from other than [facility] providers in the past year (date may be approximate).     Assessment:   This is a routine wellness examination for Demba.  Hearing/Vision screen Hearing Screening - Comments:: Denies hearing difficulties   Vision Screening - Comments:: No vision problems; will schedule routine eye exam soon     Goals Addressed   None   Depression Screen    07/06/2023    5:05 PM 07/30/2022    4:12 PM 02/12/2022   11:02 AM 09/05/2021   10:24 AM 06/04/2021   10:20 AM 02/11/2021    2:26 PM 09/09/2020    2:55 PM  PHQ 2/9 Scores  PHQ - 2 Score 0 0 0 0 0 0 0  PHQ- 9 Score  0 0        Fall Risk    07/06/2023    5:06 PM 09/04/2022   11:14 AM 07/30/2022    4:02 PM 02/12/2022   10:57 AM 09/05/2021   10:24 AM  Fall Risk   Falls in the past year? 0 0 0 0 0  Number falls in past yr: 0 0 0 0 0  Injury with Fall? 0 0 0 0 0  Risk for fall due to : No Fall Risks No Fall Risks No Fall Risks  No Fall Risks  Follow up Falls prevention discussed;Education provided;Falls evaluation completed        MEDICARE RISK AT HOME: Medicare Risk at Home Any stairs in or around the home?: No If so, are there any without handrails?: No Home free of loose throw rugs in walkways, pet beds, electrical cords, etc?: Yes Adequate lighting in your home to reduce risk of falls?: Yes Life alert?: No Use of a cane, walker or w/c?: No Grab bars in the bathroom?: Yes Shower chair or bench in shower?: Yes Elevated toilet seat or a handicapped  toilet?: Yes  TIMED UP AND GO:  Was the test performed?  No    Cognitive Function:    09/04/2022   11:14 AM 06/04/2021   10:21 AM 05/28/2020    4:12 PM  08/27/2015    2:45 PM  MMSE - Mini Mental State Exam  Orientation to time 5 5 5 5   Orientation to Place 5 5 4 5   Registration 3 3 3 3   Attention/ Calculation 5 4 5 5   Recall 3 3 3 3   Language- name 2 objects 2 2 2 2   Language- repeat 1 1 1 1   Language- follow 3 step command 3 3 3 3   Language- read & follow direction 1 1 1 1   Write a sentence 1 1 1 1   Copy design 1 1 1  0  Total score 30 29 29 29         07/06/2023    5:07 PM 09/04/2022   11:16 AM  6CIT Screen  What Year? 0 points 0 points  What month? 0 points 0 points  What time? 0 points 0 points  Count back from 20 0 points 0 points  Months in reverse 2 points 0 points  Repeat phrase 2 points 0 points  Total Score 4 points 0 points    Immunizations Immunization History  Administered Date(s) Administered   Moderna Sars-Covid-2 Vaccination 04/22/2020, 05/20/2020   Pneumococcal Polysaccharide-23 01/24/2015    TDAP status: Due, Education has been provided regarding the importance of this vaccine. Advised may receive this vaccine at local pharmacy or Health Dept. Aware to provide a copy of the vaccination record if obtained from local pharmacy or Health Dept. Verbalized acceptance and understanding.  Flu Vaccine status: Declined, Education has been provided regarding the importance of this vaccine but patient still declined. Advised may receive this vaccine at local pharmacy or Health Dept. Aware to provide a copy of the vaccination record if obtained from local pharmacy or Health Dept. Verbalized acceptance and understanding.  Pneumococcal vaccine status: Declined,  Education has been provided regarding the importance of this vaccine but patient still declined. Advised may receive this vaccine at local pharmacy or Health Dept. Aware to provide a copy of the vaccination record if obtained from local pharmacy or Health Dept. Verbalized acceptance and understanding.   Covid-19 vaccine status: Declined, Education has been provided  regarding the importance of this vaccine but patient still declined. Advised may receive this vaccine at local pharmacy or Health Dept.or vaccine clinic. Aware to provide a copy of the vaccination record if obtained from local pharmacy or Health Dept. Verbalized acceptance and understanding.  Qualifies for Shingles Vaccine? Yes   Zostavax completed No   Shingrix Completed?: No.    Education has been provided regarding the importance of this vaccine. Patient has been advised to call insurance company to determine out of pocket expense if they have not yet received this vaccine. Advised may also receive vaccine at local pharmacy or Health Dept. Verbalized acceptance and understanding.  Screening Tests Health Maintenance  Topic Date Due   DTaP/Tdap/Td (1 - Tdap) Never done   Colonoscopy  Never done   Pneumonia Vaccine 3+ Years old (2 of 2 - PCV) 11/26/2021   FOOT EXAM  09/05/2022   Diabetic kidney evaluation - Urine ACR  01/18/2023   HEMOGLOBIN A1C  01/28/2023   COVID-19 Vaccine (3 - 2023-24 season) 05/23/2023   OPHTHALMOLOGY EXAM  07/31/2023 (Originally 11/26/2020)   Zoster Vaccines- Shingrix (1 of 2) 07/31/2023 (Originally 11/27/2006)  INFLUENZA VACCINE  12/20/2023 (Originally 04/22/2023)   Diabetic kidney evaluation - eGFR measurement  04/12/2024   Medicare Annual Wellness (AWV)  07/05/2024   Hepatitis C Screening  Completed   HPV VACCINES  Aged Out    Health Maintenance  Health Maintenance Due  Topic Date Due   DTaP/Tdap/Td (1 - Tdap) Never done   Colonoscopy  Never done   Pneumonia Vaccine 60+ Years old (2 of 2 - PCV) 11/26/2021   FOOT EXAM  09/05/2022   Diabetic kidney evaluation - Urine ACR  01/18/2023   HEMOGLOBIN A1C  01/28/2023   COVID-19 Vaccine (3 - 2023-24 season) 05/23/2023    Colorectal cancer screening:  Patient declines at this time   Lung Cancer Screening: (Low Dose CT Chest recommended if Age 37-80 years, 20 pack-year currently smoking OR have quit w/in 15years.)  does not qualify.   Lung Cancer Screening Referral: n/a  Additional Screening:  Hepatitis C Screening: does qualify; Completed 08/30/20  Vision Screening: Recommended annual ophthalmology exams for early detection of glaucoma and other disorders of the eye. Is the patient up to date with their annual eye exam?  No  Who is the provider or what is the name of the office in which the patient attends annual eye exams? none If pt is not established with a provider, would they like to be referred to a provider to establish care? No .   Dental Screening: Recommended annual dental exams for proper oral hygiene  Diabetic Foot Exam: Diabetic Foot Exam: Overdue, Pt has been advised about the importance in completing this exam. Pt is scheduled for diabetic foot exam on at next office visit.  Community Resource Referral / Chronic Care Management: CRR required this visit?  No   CCM required this visit?  No     Plan:     I have personally reviewed and noted the following in the patient's chart:   Medical and social history Use of alcohol, tobacco or illicit drugs  Current medications and supplements including opioid prescriptions. Patient is not currently taking opioid prescriptions. Functional ability and status Nutritional status Physical activity Advanced directives List of other physicians Hospitalizations, surgeries, and ER visits in previous 12 months Vitals Screenings to include cognitive, depression, and falls Referrals and appointments  In addition, I have reviewed and discussed with patient certain preventive protocols, quality metrics, and best practice recommendations. A written personalized care plan for preventive services as well as general preventive health recommendations were provided to patient.     Kandis Fantasia [place], [place]   16/06/9603   After Visit Summary: (Mail) Due to this being a telephonic visit, the after visit summary with patients personalized plan  was offered to patient via mail   Nurse Notes: No concerns at this time

## 2023-07-07 ENCOUNTER — Telehealth (HOSPITAL_COMMUNITY): Payer: Self-pay

## 2023-07-07 NOTE — Telephone Encounter (Signed)
Attempted to reach pt yesterday regarding meeting this morning, no answer. I did LVM for him to return my call.  I have not heard back anything from him as of this morning.  We normally would meet at [facility] at 9.   Will try again next week.   Kerry Hough, EMT-Paramedic  [phone] 07/07/2023

## 2023-07-09 ENCOUNTER — Ambulatory Visit: Payer: Self-pay

## 2023-07-09 NOTE — Patient Outreach (Signed)
Care Coordination   07/09/2023 Name: Timothy Lambert MRN: [id number] DOB: 1955/11/19   Care Coordination Outreach Attempts:  An unsuccessful telephone outreach was attempted for a scheduled appointment today.  Follow Up Plan:  Additional outreach attempts will be made to offer the patient care coordination information and services.   Encounter Outcome:  No Answer   Care Coordination Interventions:  No, not indicated    Delsa Sale RN BSN CCM [place]  [facility], [facility] Health Nurse Care Coordinator  Direct Dial: [phone] Website: [email or web address]

## 2023-07-15 ENCOUNTER — Other Ambulatory Visit (HOSPITAL_COMMUNITY): Payer: Medicare Other

## 2023-07-20 ENCOUNTER — Telehealth (HOSPITAL_COMMUNITY): Payer: Self-pay

## 2023-07-20 NOTE — Telephone Encounter (Signed)
Reached out to pt regarding to see if pt is interested in meeting tomor , or anymore for that matter.  He did not answer. I did LVM for him to return my call.   He did miss his labs last week at clinic.     Kerry Hough, EMT-Paramedic  [phone] 07/20/2023

## 2023-07-28 ENCOUNTER — Telehealth (HOSPITAL_COMMUNITY): Payer: Self-pay

## 2023-07-28 NOTE — Telephone Encounter (Signed)
Called and was unable to leave patient a voice message to confirm/remind patient of [facility] on 07/29/23.

## 2023-07-29 ENCOUNTER — Encounter (HOSPITAL_COMMUNITY): Payer: Medicare Other

## 2023-07-29 ENCOUNTER — Telehealth (HOSPITAL_COMMUNITY): Payer: Self-pay

## 2023-07-29 ENCOUNTER — Ambulatory Visit (HOSPITAL_COMMUNITY): Admission: RE | Admit: 2023-07-29 | Payer: Medicare Other | Source: Ambulatory Visit

## 2023-07-29 NOTE — Telephone Encounter (Signed)
FMLA patient work faxed to [employer] on 11.07.24. left message to inform spouse that the paper work was sent

## 2023-08-02 ENCOUNTER — Telehealth (HOSPITAL_COMMUNITY): Payer: Self-pay

## 2023-08-02 ENCOUNTER — Ambulatory Visit: Payer: Self-pay

## 2023-08-02 ENCOUNTER — Other Ambulatory Visit: Payer: Self-pay

## 2023-08-02 NOTE — Telephone Encounter (Signed)
Patient is now discharged from [employer].  Patient has/has not met the following goals:  Yes :Patient expresses basic understanding of medications and what they are for Yes :Patient able to verbalize heart failure specific dietary/fluid restrictions Yes :Patient is aware of who to call if they have medical concerns or if they need to schedule or change appts Yes :Patient has a scale for daily weights and weighs regularly Yes :Patient able to verbalize concerning symptoms when they should call the HF clinic (weight gain ranges, etc) Yes :Patient has a PCP and has seen within the past year or has upcoming appt Yes :Patient has reliable access to getting their medications Yes :Patient has shown they are able to reorder medications reliably No :Patient has had admission in past 30 days- if yes how many? No :Patient has had admission in past 90 days- if yes how many?  Discharge Comments:   Pt is now being d/c from paramedicine due to lack of contact, no shows to appointments and unable to reach.  Last home visit was 10/2 and pt has not returned my calls.  He is not compliant with meds but that is by choice.  He is aware of the severe risks as we have had those conversations many times in the past visits.  He has txp to get to his appointments and meds.    Kerry Hough, EMT-Paramedic  [phone] 08/02/2023

## 2023-08-02 NOTE — Patient Outreach (Signed)
  Care Coordination   08/02/2023 Name: Timothy Lambert MRN: [id number] DOB: 09-Jul-1956   Care Coordination Outreach Attempts:  An unsuccessful telephone outreach was attempted for a scheduled appointment today.  Follow Up Plan:  No further outreach attempts will be made at this time. We have been unable to contact the patient to offer or enroll patient in care coordination services  Encounter Outcome:  No Answer   Care Coordination Interventions:  No, not indicated    Delsa Sale RN BSN CCM [place]  [facility], [facility] Health Nurse Care Coordinator  Direct Dial: [phone] Website: [email or web address]

## 2023-08-09 ENCOUNTER — Telehealth: Payer: Self-pay

## 2023-08-09 NOTE — Patient Outreach (Signed)
Attempted to contact patient regarding DM eye, Colorectal exam, Follow up with PCP for A1C. Left voicemail for patient to return my call at [phone].  Nicholes Rough, CMA Care Guide [facility]

## 2023-09-17 ENCOUNTER — Other Ambulatory Visit: Payer: Self-pay

## 2023-09-17 ENCOUNTER — Encounter: Payer: Self-pay | Admitting: Internal Medicine

## 2023-09-17 ENCOUNTER — Ambulatory Visit: Payer: Medicare Other | Attending: Internal Medicine | Admitting: Internal Medicine

## 2023-09-17 ENCOUNTER — Telehealth: Payer: Self-pay

## 2023-09-17 VITALS — BP 128/79 | HR 117 | Temp 98.5°F | Ht 67.0 in | Wt 153.0 lb

## 2023-09-17 DIAGNOSIS — E1121 Type 2 diabetes mellitus with diabetic nephropathy: Secondary | ICD-10-CM

## 2023-09-17 DIAGNOSIS — N184 Chronic kidney disease, stage 4 (severe): Secondary | ICD-10-CM

## 2023-09-17 DIAGNOSIS — I2510 Atherosclerotic heart disease of native coronary artery without angina pectoris: Secondary | ICD-10-CM | POA: Diagnosis not present

## 2023-09-17 DIAGNOSIS — I480 Paroxysmal atrial fibrillation: Secondary | ICD-10-CM

## 2023-09-17 DIAGNOSIS — Z794 Long term (current) use of insulin: Secondary | ICD-10-CM | POA: Diagnosis not present

## 2023-09-17 DIAGNOSIS — I5042 Chronic combined systolic (congestive) and diastolic (congestive) heart failure: Secondary | ICD-10-CM

## 2023-09-17 DIAGNOSIS — Z1211 Encounter for screening for malignant neoplasm of colon: Secondary | ICD-10-CM

## 2023-09-17 DIAGNOSIS — I25119 Atherosclerotic heart disease of native coronary artery with unspecified angina pectoris: Secondary | ICD-10-CM | POA: Insufficient documentation

## 2023-09-17 LAB — POCT GLYCOSYLATED HEMOGLOBIN (HGB A1C): HbA1c, POC (controlled diabetic range): 13.2 % — AB (ref 0.0–7.0)

## 2023-09-17 LAB — GLUCOSE, POCT (MANUAL RESULT ENTRY): POC Glucose: 378 mg/dL — AB (ref 70–99)

## 2023-09-17 MED ORDER — ATORVASTATIN CALCIUM 80 MG PO TABS
80.0000 mg | ORAL_TABLET | Freq: Every day | ORAL | 0 refills | Status: DC
  Filled 2023-09-17: qty 90, 90d supply, fill #0

## 2023-09-17 MED ORDER — INSULIN LISPRO (1 UNIT DIAL) 100 UNIT/ML (KWIKPEN)
2.0000 [IU] | PEN_INJECTOR | Freq: Two times a day (BID) | SUBCUTANEOUS | 11 refills | Status: DC
  Filled 2023-09-17: qty 9, 84d supply, fill #0

## 2023-09-17 MED ORDER — LANTUS SOLOSTAR 100 UNIT/ML ~~LOC~~ SOPN
16.0000 [IU] | PEN_INJECTOR | Freq: Every day | SUBCUTANEOUS | 11 refills | Status: DC
  Filled 2023-09-17: qty 15, 93d supply, fill #0

## 2023-09-17 MED ORDER — PEN NEEDLES 31G X 8 MM MISC
6 refills | Status: DC
  Filled 2023-09-17: qty 100, 30d supply, fill #0

## 2023-09-17 NOTE — Telephone Encounter (Signed)
Patient given [employer] application in-person today and notified that enrollment for free fills of Humalog and Basaglar expires 09/21/2023. Patient took application with him to return at a later date.

## 2023-09-17 NOTE — Progress Notes (Signed)
Patient ID: Timothy Lambert, male    DOB: 04-Aug-1956  MRN: [id number]  CC: Diabetes (DM f/u. /No to all vax. No to colonoscopy. )   Subjective: Timothy Lambert is a 67 y.o. male who presents for chronic ds management.  Patient last saw me 07/2022.  Wife is with him today His concerns today include:  Pt with hx of DM with retinopathy and nephropathy, HTN, HL, CAD (with DES LAD 2016, NSTEMI 12/2021), embolic CVA, PAF (thought to be a poor candidate for anticoagulation because of history of EtOH abuse at the time of his CVA in 2016 ) ETOH withdrawal sz, CKD stage 4, macroalbuminuria.    DM: Results for orders placed or performed in visit on 09/17/23  POCT glycosylated hemoglobin (Hb A1C)   Collection Time: 09/17/23  2:10 PM  Result Value Ref Range   Hemoglobin A1C     HbA1c POC (<> result, manual entry)     HbA1c, POC (prediabetic range)     HbA1c, POC (controlled diabetic range) 13.2 (A) 0.0 - 7.0 %  POCT glucose (manual entry)   Collection Time: 09/17/23  2:11 PM  Result Value Ref Range   POC Glucose 378 (A) 70 - 99 mg/dl  Left on Lantus 22 units daily and Humalog 2 units with 2 largest meals of the day when I last saw him 07/2022. He tells me he is taking 12 units Lantus and Humalog sometimes.  Out of both x 2 wks Has Dexcom but does not have it on and does not have reader with him.  Admits that he is eating the wrong things.   He has not had DM eye exam done   CAD/CHF systolic/PAF: Compliant with  carvedilol 25 BID, furosemide 40 mg daily PRN, Eliquis 5 mg twice a day, BiDil 20/37 2 tab TID, Lipitor 80 mg daily. Has bottles with him Not checking BP No CP/SOB/LE edema/HA No bruising/bleeding on Eliquis  CKD 4-5/HTN: last GFR was 18.  Creat 3.6 Checking blood pressure? No Limiting salt in foods? Yes  HM:  has stool kit at home, states he will send it in.  Declines colonoscopy Patient Active Problem List   Diagnosis Date Noted   Atherosclerosis of native coronary artery of  native heart with angina pectoris (HCC) 09/17/2023   Anemia 01/22/2022   Lobar pneumonia (HCC) 01/21/2022   Alcohol abuse 01/21/2022   Influenza vaccine refused 09/09/2020   Type 2 diabetes mellitus with hyperlipidemia (HCC) 06/29/2020   Acute kidney injury superimposed on chronic kidney disease (HCC) 02/15/2018   Macroalbuminuric diabetic nephropathy (HCC) 02/15/2018   Diabetic retinopathy (HCC) 11/18/2015   Paroxysmal atrial fibrillation (HCC) 10/28/2015   Cognitive deficit, post-stroke 05/13/2015   Essential hypertension 02/19/2015   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     apixaban (ELIQUIS) 5 MG TABS tablet Take 1 tablet (5 mg total) by mouth 2 (two) times daily. 180 tablet 1   carvedilol (COREG) 25 MG tablet Take 1 tablet (25 mg total) by mouth 2 (two) times daily. 60 tablet 11  Continuous Glucose Receiver (DEXCOM G7 RECEIVER) DEVI Use to check blood sugar three times daily. Change sensors once every 10 days. E11.21 1 each 0   Continuous Glucose Sensor (DEXCOM G7 SENSOR) MISC Use to check blood sugar three times daily. Change sensors once every 10 days. E11.21 3 each 6   furosemide (LASIX) 20 MG tablet Take 2 tablets (40 mg total) by mouth as needed. For swelling or weight gain of 3lb in 24 hours or 5lb in a week 180 tablet 1   glucose blood (ACCU-CHEK GUIDE) test strip Use as instructed 3 times daily DX code E11.9 100 each 3   Insulin Syringe-Needle U-100 (INSULIN SYRINGE .5CC/30GX1/2") 30G X 1/2" 0.5 ML MISC Check blood sugar TID & QHS 100 each 2    isosorbide-hydrALAZINE (BIDIL) 20-37.5 MG tablet Take 2 tablets by mouth 3 (three) times daily. 270 tablet 3   Lancets (ACCU-CHEK SOFT TOUCH) lancets Use as instructed 100 each 12   TRUEPLUS LANCETS 28G MISC 1 each by Does not apply route 3 (three) times daily before meals. 100 each 12   No current facility-administered medications on file prior to visit.    Allergies  Allergen Reactions   Trulicity [Dulaglutide]     Upset his stomach    Social History   Socioeconomic History   Marital status: Married    Spouse name: Not on file   Number of children: Not on file   Years of education: Not on file   Highest education level: Not on file  Occupational History   Not on file  Tobacco Use   Smoking status: Former    Current packs/day: 0.33    Average packs/day: 0.3 packs/day for 14.0 years (4.6 ttl pk-yrs)    Types: Cigarettes   Smokeless tobacco: Never   Tobacco comments:    "quit smoking cigarettes in 1992"  Substance and Sexual Activity   Alcohol use: No    Comment: 02/19/2015 "stopped drinking 01/22/2015"   Drug use: No   Sexual activity: Not Currently  Other Topics Concern   Not on file  Social History Narrative   Lives   Caffeine use: Coffee sometimes   Social Drivers of Corporate investment banker Strain: Low Risk  (07/06/2023)   Overall Financial Resource Strain (CARDIA)    Difficulty of Paying Living Expenses: Not hard at all  Food Insecurity: No Food Insecurity (07/06/2023)   Hunger Vital Sign    Worried About Running Out of Food in the Last Year: Never true    Ran Out of Food in the Last Year: Never true  Transportation Needs: No Transportation Needs (07/06/2023)   PRAPARE - Administrator, Civil Service (Medical): No    Lack of Transportation (Non-Medical): No  Physical Activity: Inactive (07/06/2023)   Exercise Vital Sign    Days of Exercise per Week: 0 days    Minutes of Exercise per Session: 0 min  Stress: No Stress Concern Present  (07/06/2023)   [employer] of Occupational Health - Occupational Stress Questionnaire    Feeling of Stress : Not at all  Social Connections: Moderately Isolated (07/06/2023)   Social Connection and Isolation Panel [NHANES]    Frequency of Communication with Friends and Family: More than three times a week    Frequency of Social Gatherings with Friends and Family: Three times a week    Attends Religious Services: Never    Active Member of Clubs or Organizations: No    Attends Banker Meetings: Never  Marital Status: Married  Catering manager Violence: Not At Risk (07/06/2023)   Humiliation, Afraid, Rape, and Kick questionnaire    Fear of Current or Ex-Partner: No    Emotionally Abused: No    Physically Abused: No    Sexually Abused: No    Family History  Problem Relation Age of Onset   Diabetes Father    Seizures Neg Hx     Past Surgical History:  Procedure Laterality Date   CARDIAC CATHETERIZATION N/A 01/23/2015   Procedure: Left Heart Cath and Coronary Angiography;  Surgeon: Tonny Bollman, MD;  Location: [facility];  Service: Cardiovascular;  Laterality: N/A;   CARDIAC CATHETERIZATION N/A 01/23/2015   Procedure: Coronary Stent Intervention;  Surgeon: Tonny Bollman, MD;  Location: [facility];  Service: Cardiovascular;  Laterality: N/A;    ROS: Review of Systems Negative except as stated above  PHYSICAL EXAM: BP 128/79   Pulse (!) 117   Temp 98.5 F (36.9 C) (Oral)   Ht 5\' 7"  (1.702 m)   Wt 153 lb (69.4 kg)   SpO2 99%   BMI 23.96 kg/m   Wt Readings from Last 3 Encounters:  09/17/23 153 lb (69.4 kg)  07/06/23 142 lb (64.4 kg)  06/23/23 142 lb (64.4 kg)    Physical Exam   General appearance - alert, well appearing, older African-American male and in no distress Mental status - normal mood, behavior, speech, dress, motor activity, and thought processes Neck - supple, no significant adenopathy Chest - clear to  auscultation, no wheezes, rales or rhonchi, symmetric air entry Heart -heart sounds regular at this time Extremities -trace lower extremity edema left greater than right. Diabetic Foot Exam - Simple   Simple Foot Form Diabetic Foot exam was performed with the following findings: Yes 09/17/2023  5:55 PM  Visual Inspection See comments: Yes Sensation Testing Intact to touch and monofilament testing bilaterally: Yes Pulse Check Posterior Tibialis and Dorsalis pulse intact bilaterally: Yes Comments Toenails are thick and overgrown.         Latest Ref Rng & Units 04/13/2023    9:10 AM 01/08/2023    8:35 AM 01/05/2023    9:17 AM  CMP  Glucose 70 - 99 mg/dL 563  875  643   BUN 8 - 23 mg/dL 37  45  41   Creatinine 0.61 - 1.24 mg/dL 3.29  5.18  8.41   Sodium 135 - 145 mmol/L 135  132  132   Potassium 3.5 - 5.1 mmol/L 4.3  4.6  5.7   Chloride 98 - 111 mmol/L 104  96  101   CO2 22 - 32 mmol/L 19  24  22    Calcium 8.9 - 10.3 mg/dL 8.9  9.4  9.3    Lipid Panel     Component Value Date/Time   CHOL 128 04/13/2023 0910   CHOL 122 09/05/2021 1129   TRIG 125 04/13/2023 0910   HDL 28 (L) 04/13/2023 0910   HDL 33 (L) 09/05/2021 1129   CHOLHDL 4.6 04/13/2023 0910   VLDL 25 04/13/2023 0910   LDLCALC 75 04/13/2023 0910   LDLCALC 68 09/05/2021 1129    CBC    Component Value Date/Time   WBC 5.7 04/13/2023 0910   RBC 4.12 (L) 04/13/2023 0910   HGB 11.9 (L) 04/13/2023 0910   HGB 12.9 (L) 09/05/2021 1129   HCT 34.1 (L) 04/13/2023 0910   HCT 39.0 09/05/2021 1129   PLT 167 04/13/2023 0910   PLT  184 09/05/2021 1129   MCV 82.8 04/13/2023 0910   MCV 89 09/05/2021 1129   MCH 28.9 04/13/2023 0910   MCHC 34.9 04/13/2023 0910   RDW 13.0 04/13/2023 0910   RDW 13.2 09/05/2021 1129   LYMPHSABS 1.7 02/19/2015 2333   MONOABS 0.5 02/19/2015 2333   EOSABS 0.6 02/19/2015 2333   BASOSABS 0.0 02/19/2015 2333    ASSESSMENT AND PLAN: 1. Type 2 diabetes mellitus with diabetic nephropathy, with  long-term current use of insulin (HCC) (Primary) Not at goal.  I suspect nonadherence to medication and he reports dietary noncompliance.  He has been out of both insulin for 2 weeks.  Strongly encouraged him to take his insulins.  Increase Lantus insulin to 16 units daily.  Encouraged him to use the Humalog as prescribed with the 2 largest meals of the day. Advised to apply his continuous glucose monitor when he returns home.  We will have him follow-up with the clinical pharmacist in about 3 weeks for recheck. - POCT glycosylated hemoglobin (Hb A1C) - POCT glucose (manual entry) - POCT URINALYSIS DIP (CLINITEK) - CBC - Comprehensive metabolic panel - insulin lispro (HUMALOG KWIKPEN) 100 UNIT/ML KwikPen; Inject 2 Units into the skin 2 (two) times daily before lunch and supper.  Dispense: 15 mL; Refill: 11 - Ambulatory referral to Ophthalmology - Microalbumin / creatinine urine ratio - insulin glargine (LANTUS SOLOSTAR) 100 UNIT/ML Solostar Pen; Inject 16 Units into the skin daily.  Dispense: 15 mL; Refill: 11 - Insulin Pen Needle (PEN NEEDLES) 31G X 8 MM MISC; use as directed  Dispense: 100 each; Refill: 6  2. Coronary artery disease involving native coronary artery of native heart without angina pectoris Continue atorvastatin and carvedilol. - atorvastatin (LIPITOR) 80 MG tablet; Take 1 tablet (80 mg total) by mouth daily.  Dispense: 90 tablet; Refill: 0  3. PAF (paroxysmal atrial fibrillation) (HCC) Continue Eliquis.  Check CBC today.  4. Chronic combined systolic and diastolic congestive heart failure (HCC) Compensated.  Continue furosemide as needed.  Continue BiDil 20/37.52 tablets 3 times a day, carvedilol 25 mg twice a day  5. Stage 4 chronic kidney disease (HCC) Strongly advised that he gets back in with nephrology.  He is overdue for follow-up.  Discussed the importance of good diabetes and blood pressure control. - Ambulatory referral to Nephrology  6. Screening for colon  cancer Patient not interested in colonoscopy.  He promises to turn in the fit test that he has at home.   Patient was given the opportunity to ask questions.  Patient verbalized understanding of the plan and was able to repeat key elements of the plan.   This documentation was completed using Paediatric nurse.  Any transcriptional errors are unintentional.  Orders Placed This Encounter  Procedures   CBC   Comprehensive metabolic panel   Microalbumin / creatinine urine ratio   Ambulatory referral to Ophthalmology   Ambulatory referral to Nephrology   POCT glycosylated hemoglobin (Hb A1C)   POCT glucose (manual entry)   POCT URINALYSIS DIP (CLINITEK)     Requested Prescriptions   Signed Prescriptions Disp Refills   atorvastatin (LIPITOR) 80 MG tablet 90 tablet 0    Sig: Take 1 tablet (80 mg total) by mouth daily.   insulin lispro (HUMALOG KWIKPEN) 100 UNIT/ML KwikPen 15 mL 11    Sig: Inject 2 Units into the skin 2 (two) times daily before lunch and supper.   insulin glargine (LANTUS SOLOSTAR) 100 UNIT/ML Solostar Pen 15 mL 11  Sig: Inject 16 Units into the skin daily.   Insulin Pen Needle (PEN NEEDLES) 31G X 8 MM MISC 100 each 6    Sig: use as directed    Return in about 4 months (around 01/16/2024) for Appt with [facility] in 3 BS check.  [name], MD, FACP

## 2023-09-17 NOTE — Patient Instructions (Signed)
Please wear your Dexcom device.  We will have you follow-up with the clinical pharmacist in 3 weeks for recheck on blood sugars.  Take the Lantus insulin 15 units daily.  Take your Humalog 2 units with the 2 largest meals of the day.  Please call [facility] to schedule a follow-up appointment.  We have referred you to the ophthalmologist.  We will try to get you in with 1 in [place].

## 2023-09-18 LAB — MICROALBUMIN / CREATININE URINE RATIO

## 2023-09-19 ENCOUNTER — Other Ambulatory Visit: Payer: Self-pay | Admitting: Internal Medicine

## 2023-09-19 ENCOUNTER — Telehealth: Payer: Self-pay | Admitting: Internal Medicine

## 2023-09-19 DIAGNOSIS — E875 Hyperkalemia: Secondary | ICD-10-CM

## 2023-09-19 MED ORDER — LOKELMA 5 G PO PACK
PACK | ORAL | 0 refills | Status: DC
  Filled 2023-09-19: qty 1, 1d supply, fill #0

## 2023-09-19 NOTE — Telephone Encounter (Signed)
PC placed to Timothy Lambert this evening. Timothy Lambert informed that his kidney function is not 100 and remains poor.  Should f/u with nephrology.  Anemia has improved. Potassium level is elevated likely due to poor kidney function.  Advise that this can cause his heart to go into abnormal and deadly rhythms. Recommend one time dose of Lokelma.  Advise to get it asap and take.He told me to send to our pharmacy and he will pick up tomorrow.  Return to lab in 3 days after taking for recheck. Advise to cut out potassium rich foods like bananas, oranges and OJ.  Timothy Lambert expressed understanding and was able to repeat back key elements.  Results for orders placed or performed in visit on 09/17/23  POCT glycosylated hemoglobin (Hb A1C)   Collection Time: 09/17/23  2:10 PM  Result Value Ref Range   Hemoglobin A1C     HbA1c POC (<> result, manual entry)     HbA1c, POC (prediabetic range)     HbA1c, POC (controlled diabetic range) 13.2 (A) 0.0 - 7.0 %  POCT glucose (manual entry)   Collection Time: 09/17/23  2:11 PM  Result Value Ref Range   POC Glucose 378 (A) 70 - 99 mg/dl  CBC   Collection Time: 09/17/23  2:51 PM  Result Value Ref Range   WBC 6.7 3.4 - 10.8 x10E3/uL   RBC 4.31 4.14 - 5.80 x10E6/uL   Hemoglobin 12.5 (L) 13.0 - 17.7 g/dL   Hematocrit 16.1 09.6 - 51.0 %   MCV 90 79 - 97 fL   MCH 29.0 26.6 - 33.0 pg   MCHC 32.2 31.5 - 35.7 g/dL   RDW 04.5 40.9 - 81.1 %   Platelets 254 150 - 450 x10E3/uL  Comprehensive metabolic panel   Collection Time: 09/17/23  2:51 PM  Result Value Ref Range   Glucose 429 (H) 70 - 99 mg/dL   BUN 38 (H) 8 - 27 mg/dL   Creatinine, Ser 9.14 (H) 0.76 - 1.27 mg/dL   eGFR 17 (L) >78 GN/FAO/1.30   BUN/Creatinine Ratio 10 10 - 24   Sodium 136 134 - 144 mmol/L   Potassium 5.8 (H) 3.5 - 5.2 mmol/L   Chloride 101 96 - 106 mmol/L   CO2 21 20 - 29 mmol/L   Calcium 9.1 8.6 - 10.2 mg/dL   Total Protein 6.1 6.0 - 8.5 g/dL   Albumin 4.1 3.9 - 4.9 g/dL   Globulin, Total 2.0 1.5 - 4.5 g/dL    Bilirubin Total <8.6 0.0 - 1.2 mg/dL   Alkaline Phosphatase 114 44 - 121 IU/L   AST 10 0 - 40 IU/L   ALT 7 0 - 44 IU/L  Microalbumin / creatinine urine ratio   Collection Time: 09/17/23  2:51 PM  Result Value Ref Range   Creatinine, Urine WILL FOLLOW    Microalbumin, Urine WILL FOLLOW    Microalb/Creat Ratio WILL FOLLOW

## 2023-09-20 ENCOUNTER — Other Ambulatory Visit: Payer: Self-pay

## 2023-09-20 LAB — COMPREHENSIVE METABOLIC PANEL
ALT: 7 IU/L (ref 0–44)
AST: 10 IU/L (ref 0–40)
Albumin: 4.1 g/dL (ref 3.9–4.9)
Alkaline Phosphatase: 114 IU/L (ref 44–121)
BUN/Creatinine Ratio: 10 (ref 10–24)
BUN: 38 mg/dL — ABNORMAL HIGH (ref 8–27)
CO2: 21 mmol/L (ref 20–29)
Calcium: 9.1 mg/dL (ref 8.6–10.2)
Chloride: 101 mmol/L (ref 96–106)
Creatinine, Ser: 3.7 mg/dL — ABNORMAL HIGH (ref 0.76–1.27)
Globulin, Total: 2 g/dL (ref 1.5–4.5)
Glucose: 429 mg/dL — ABNORMAL HIGH (ref 70–99)
Potassium: 5.8 mmol/L — ABNORMAL HIGH (ref 3.5–5.2)
Sodium: 136 mmol/L (ref 134–144)
Total Protein: 6.1 g/dL (ref 6.0–8.5)
eGFR: 17 mL/min/{1.73_m2} — ABNORMAL LOW (ref >59)

## 2023-09-20 LAB — CBC
Hematocrit: 38.8 % (ref 37.5–51.0)
Hemoglobin: 12.5 g/dL — ABNORMAL LOW (ref 13.0–17.7)
MCH: 29 pg (ref 26.6–33.0)
MCHC: 32.2 g/dL (ref 31.5–35.7)
MCV: 90 fL (ref 79–97)
Platelets: 254 10*3/uL (ref 150–450)
RBC: 4.31 x10E6/uL (ref 4.14–5.80)
RDW: 12.5 % (ref 11.6–15.4)
WBC: 6.7 10*3/uL (ref 3.4–10.8)

## 2023-09-20 LAB — MICROALBUMIN / CREATININE URINE RATIO

## 2023-09-29 ENCOUNTER — Other Ambulatory Visit: Payer: Self-pay

## 2023-10-08 ENCOUNTER — Ambulatory Visit: Payer: Medicare Other | Admitting: Pharmacist

## 2023-10-24 ENCOUNTER — Other Ambulatory Visit: Payer: Self-pay

## 2023-10-24 ENCOUNTER — Encounter (HOSPITAL_COMMUNITY): Payer: Self-pay | Admitting: Emergency Medicine

## 2023-10-24 ENCOUNTER — Emergency Department (HOSPITAL_COMMUNITY)
Admission: EM | Admit: 2023-10-24 | Discharge: 2023-10-24 | Disposition: A | Discharge status: Home / Self Care | Payer: Medicare Other

## 2023-10-24 ENCOUNTER — Emergency Department (HOSPITAL_COMMUNITY): Payer: Medicare Other

## 2023-10-24 DIAGNOSIS — J101 Influenza due to other identified influenza virus with other respiratory manifestations: Secondary | ICD-10-CM | POA: Insufficient documentation

## 2023-10-24 DIAGNOSIS — I129 Hypertensive chronic kidney disease with stage 1 through stage 4 chronic kidney disease, or unspecified chronic kidney disease: Secondary | ICD-10-CM | POA: Diagnosis not present

## 2023-10-24 DIAGNOSIS — I2510 Atherosclerotic heart disease of native coronary artery without angina pectoris: Secondary | ICD-10-CM | POA: Diagnosis not present

## 2023-10-24 DIAGNOSIS — I509 Heart failure, unspecified: Secondary | ICD-10-CM | POA: Insufficient documentation

## 2023-10-24 DIAGNOSIS — N189 Chronic kidney disease, unspecified: Secondary | ICD-10-CM | POA: Diagnosis not present

## 2023-10-24 DIAGNOSIS — Z7901 Long term (current) use of anticoagulants: Secondary | ICD-10-CM | POA: Insufficient documentation

## 2023-10-24 DIAGNOSIS — R509 Fever, unspecified: Secondary | ICD-10-CM | POA: Diagnosis not present

## 2023-10-24 DIAGNOSIS — I110 Hypertensive heart disease with heart failure: Secondary | ICD-10-CM | POA: Insufficient documentation

## 2023-10-24 DIAGNOSIS — Z794 Long term (current) use of insulin: Secondary | ICD-10-CM | POA: Insufficient documentation

## 2023-10-24 DIAGNOSIS — R739 Hyperglycemia, unspecified: Secondary | ICD-10-CM | POA: Diagnosis not present

## 2023-10-24 DIAGNOSIS — R059 Cough, unspecified: Secondary | ICD-10-CM | POA: Diagnosis not present

## 2023-10-24 DIAGNOSIS — R6889 Other general symptoms and signs: Secondary | ICD-10-CM | POA: Diagnosis not present

## 2023-10-24 DIAGNOSIS — J9811 Atelectasis: Secondary | ICD-10-CM | POA: Diagnosis not present

## 2023-10-24 DIAGNOSIS — Z743 Need for continuous supervision: Secondary | ICD-10-CM | POA: Diagnosis not present

## 2023-10-24 DIAGNOSIS — Z79899 Other long term (current) drug therapy: Secondary | ICD-10-CM | POA: Diagnosis not present

## 2023-10-24 DIAGNOSIS — R0989 Other specified symptoms and signs involving the circulatory and respiratory systems: Secondary | ICD-10-CM | POA: Diagnosis not present

## 2023-10-24 DIAGNOSIS — I499 Cardiac arrhythmia, unspecified: Secondary | ICD-10-CM | POA: Diagnosis not present

## 2023-10-24 DIAGNOSIS — A419 Sepsis, unspecified organism: Secondary | ICD-10-CM | POA: Diagnosis not present

## 2023-10-24 DIAGNOSIS — J111 Influenza due to unidentified influenza virus with other respiratory manifestations: Secondary | ICD-10-CM

## 2023-10-24 LAB — COMPREHENSIVE METABOLIC PANEL
ALT: 17 U/L (ref 0–44)
AST: 23 U/L (ref 15–41)
Albumin: 3.4 g/dL — ABNORMAL LOW (ref 3.5–5.0)
Alkaline Phosphatase: 60 U/L (ref 38–126)
Anion gap: 12 (ref 5–15)
BUN: 50 mg/dL — ABNORMAL HIGH (ref 8–23)
CO2: 19 mmol/L — ABNORMAL LOW (ref 22–32)
Calcium: 8.4 mg/dL — ABNORMAL LOW (ref 8.9–10.3)
Chloride: 99 mmol/L (ref 98–111)
Creatinine, Ser: 4.47 mg/dL — ABNORMAL HIGH (ref 0.61–1.24)
GFR, Estimated: 14 mL/min — ABNORMAL LOW (ref >60)
Glucose, Bld: 255 mg/dL — ABNORMAL HIGH (ref 70–99)
Potassium: 4 mmol/L (ref 3.5–5.1)
Sodium: 130 mmol/L — ABNORMAL LOW (ref 135–145)
Total Bilirubin: 0.8 mg/dL (ref 0.0–1.2)
Total Protein: 6.7 g/dL (ref 6.5–8.1)

## 2023-10-24 LAB — CBC WITH DIFFERENTIAL/PLATELET
Abs Immature Granulocytes: 0.04 10*3/uL (ref 0.00–0.07)
Basophils Absolute: 0 10*3/uL (ref 0.0–0.1)
Basophils Relative: 0 %
Eosinophils Absolute: 0 10*3/uL (ref 0.0–0.5)
Eosinophils Relative: 0 %
HCT: 34.8 % — ABNORMAL LOW (ref 39.0–52.0)
Hemoglobin: 12 g/dL — ABNORMAL LOW (ref 13.0–17.0)
Immature Granulocytes: 1 %
Lymphocytes Relative: 6 %
Lymphs Abs: 0.4 10*3/uL — ABNORMAL LOW (ref 0.7–4.0)
MCH: 29.3 pg (ref 26.0–34.0)
MCHC: 34.5 g/dL (ref 30.0–36.0)
MCV: 85.1 fL (ref 80.0–100.0)
Monocytes Absolute: 0.8 10*3/uL (ref 0.1–1.0)
Monocytes Relative: 11 %
Neutro Abs: 6.4 10*3/uL (ref 1.7–7.7)
Neutrophils Relative %: 82 %
Platelets: 134 10*3/uL — ABNORMAL LOW (ref 150–400)
RBC: 4.09 MIL/uL — ABNORMAL LOW (ref 4.22–5.81)
RDW: 13 % (ref 11.5–15.5)
WBC: 7.7 10*3/uL (ref 4.0–10.5)
nRBC: 0 % (ref 0.0–0.2)

## 2023-10-24 LAB — RESP PANEL BY RT-PCR (RSV, FLU A&B, COVID)  RVPGX2
Influenza A by PCR: POSITIVE — AB
Influenza B by PCR: NEGATIVE
Resp Syncytial Virus by PCR: NEGATIVE
SARS Coronavirus 2 by RT PCR: NEGATIVE

## 2023-10-24 LAB — APTT: aPTT: 28 s (ref 24–36)

## 2023-10-24 LAB — PROTIME-INR
INR: 1 (ref 0.8–1.2)
Prothrombin Time: 13.8 s (ref 11.4–15.2)

## 2023-10-24 LAB — LACTIC ACID, PLASMA: Lactic Acid, Venous: 1.7 mmol/L (ref 0.5–1.9)

## 2023-10-24 MED ORDER — OSELTAMIVIR PHOSPHATE 75 MG PO CAPS
75.0000 mg | ORAL_CAPSULE | Freq: Two times a day (BID) | ORAL | 0 refills | Status: DC
  Filled 2023-10-24: qty 10, 5d supply, fill #0

## 2023-10-24 MED ORDER — IPRATROPIUM-ALBUTEROL 0.5-2.5 (3) MG/3ML IN SOLN
3.0000 mL | Freq: Once | RESPIRATORY_TRACT | Status: AC
  Administered 2023-10-24: 3 mL via RESPIRATORY_TRACT
  Filled 2023-10-24: qty 3

## 2023-10-24 MED ORDER — LACTATED RINGERS IV SOLN: INTRAVENOUS | Status: DC

## 2023-10-24 MED ORDER — ACETAMINOPHEN 325 MG PO TABS
650.0000 mg | ORAL_TABLET | Freq: Once | ORAL | Status: AC
  Administered 2023-10-24: 650 mg via ORAL
  Filled 2023-10-24: qty 2

## 2023-10-24 MED ORDER — SODIUM CHLORIDE 0.9 % IV BOLUS: 500.0000 mL | Freq: Once | INTRAVENOUS | Status: DC

## 2023-10-24 MED ORDER — BENZONATATE 100 MG PO CAPS
100.0000 mg | ORAL_CAPSULE | Freq: Three times a day (TID) | ORAL | 0 refills | Status: DC
  Filled 2023-10-24: qty 21, 7d supply, fill #0

## 2023-10-24 MED ORDER — SODIUM CHLORIDE 0.9 % IV SOLN
2.0000 g | Freq: Once | INTRAVENOUS | Status: AC
  Administered 2023-10-24: 2 g via INTRAVENOUS
  Filled 2023-10-24: qty 20

## 2023-10-24 MED ORDER — SODIUM CHLORIDE 0.9 % IV SOLN
500.0000 mg | Freq: Once | INTRAVENOUS | Status: AC
  Administered 2023-10-24: 500 mg via INTRAVENOUS
  Filled 2023-10-24: qty 5

## 2023-10-24 NOTE — Discharge Instructions (Signed)
Your blood test show that your kidney function is decreased, it is very similar to what it has been in the past but it does need to be rechecked in about 1 week at your doctor's office.,  Drink plenty of clear liquids this week  You do have the flu, this is likely going to take another week or so to get better, I have prescribed a cough medication called benzonatate and the flu medicine called Tamiflu.  Tamiflu is taken twice a day for 5 days, this might give you diarrhea but it should help a little bit to get better a little bit faster and make you a little bit less contagious.  Benzonatate can be taken every 8 hours and is safe to take with medications such as DayQuil or NyQuil.  Thank you for allowing [place] to treat you in the emergency department today.  After reviewing your examination and potential testing that was done it appears that you are safe to go home.  I would like for you to follow-up with your doctor within the next several days, have them obtain your records and follow-up with them to review all potential tests and results from your visit.  If you should develop severe or worsening symptoms return to the emergency department immediately

## 2023-10-24 NOTE — ED Provider Notes (Signed)
[facility] EMERGENCY DEPARTMENT AT [facility] Provider Note   CSN: [id number] Arrival date & time: 10/24/23  9562     History  Chief Complaint  Patient presents with   Fever    Timothy Lambert is a 68 y.o. male.  68 year old male with past medical history of CHF, coronary artery disease, hypertension, and A-fib on Eliquis presents the emergency department today with fever, cough, and posttussive emesis.  This been going now for the past 2 days.  The patient does report a productive cough with this.  Denies any hemoptysis.  He denies any leg pain or swelling.  He states that he has not really had any abdominal pain or vomiting outside of coughing but did have an episode of posttussive emesis yesterday.  He came to the ER today due to fever and feeling unwell this morning.  He does report some myalgias.   Fever Associated symptoms: cough        Home Medications Prior to Admission medications   Medication Sig Start Date End Date Taking? Authorizing Provider  acetaminophen (TYLENOL) 325 MG tablet Take 2 tablets (650 mg total) by mouth every 6 (six) hours as needed for mild pain (or Fever >/= 101). 01/22/22   Arrien, York Ram, MD  apixaban (ELIQUIS) 5 MG TABS tablet Take 1 tablet (5 mg total) by mouth 2 (two) times daily. 01/05/23   Milford, Anderson Malta, FNP  atorvastatin (LIPITOR) 80 MG tablet Take 1 tablet (80 mg total) by mouth daily. 09/17/23   Marcine Matar, MD  carvedilol (COREG) 25 MG tablet Take 1 tablet (25 mg total) by mouth 2 (two) times daily. 01/05/23   Milford, Anderson Malta, FNP  Continuous Glucose Receiver (DEXCOM G7 RECEIVER) DEVI Use to check blood sugar three times daily. Change sensors once every 10 days. E11.21 02/03/23   Marcine Matar, MD  Continuous Glucose Sensor (DEXCOM G7 SENSOR) MISC Use to check blood sugar three times daily. Change sensors once every 10 days. E11.21 02/03/23   Marcine Matar, MD  furosemide (LASIX) 20 MG tablet Take 2  tablets (40 mg total) by mouth as needed. For swelling or weight gain of 3lb in 24 hours or 5lb in a week 07/30/22   Laurey Morale, MD  glucose blood (ACCU-CHEK GUIDE) test strip Use as instructed 3 times daily DX code E11.9 08/05/20   Reather Littler, MD  insulin glargine (LANTUS SOLOSTAR) 100 UNIT/ML Solostar Pen Inject 16 Units into the skin daily. 09/17/23   Marcine Matar, MD  insulin lispro (HUMALOG KWIKPEN) 100 UNIT/ML KwikPen Inject 2 Units into the skin 2 (two) times daily before lunch and supper. 09/17/23   Marcine Matar, MD  Insulin Pen Needle (PEN NEEDLES) 31G X 8 MM MISC use as directed 09/17/23   Marcine Matar, MD  Insulin Syringe-Needle U-100 (INSULIN SYRINGE .5CC/30GX1/2") 30G X 1/2" 0.5 ML MISC Check blood sugar TID & QHS 11/16/17   Geoffery Lyons, MD  isosorbide-hydrALAZINE (BIDIL) 20-37.5 MG tablet Take 2 tablets by mouth 3 (three) times daily. 01/05/23   Jacklynn Ganong, FNP  Lancets (ACCU-CHEK SOFT TOUCH) lancets Use as instructed 02/15/18   Marcine Matar, MD  sodium zirconium cyclosilicate (LOKELMA) 5 g packet Mix 1 packet according to package directions and take by mouth as directed for 1 time dose. 09/19/23   Marcine Matar, MD  TRUEPLUS LANCETS 28G MISC 1 each by Does not apply route 3 (three) times daily before meals. 02/15/18  Marcine Matar, MD      Allergies    Trulicity [dulaglutide]    Review of Systems   Review of Systems  Constitutional:  Positive for fever.  Respiratory:  Positive for cough.   All other systems reviewed and are negative.   Physical Exam Updated Vital Signs BP (!) 157/91   Pulse (!) 101   Temp 99.5 F (37.5 C) (Oral)   Resp (!) 23   SpO2 95%  Physical Exam Vitals and nursing note reviewed.   Gen: NAD Eyes: PERRL, EOMI HEENT: no oropharyngeal swelling Neck: trachea midline Resp: Intermittent coarse breath sounds throughout lung fields are clear with coughing, mild wheezes noted. Card: RRR, no murmurs, rubs,  or gallops Abd: nontender, nondistended Extremities: no calf tenderness, no edema Vascular: 2+ radial pulses bilaterally, 2+ DP pulses bilaterally Skin: no rashes Psyc: acting appropriately   ED Results / Procedures / Treatments   Labs (all labs ordered are listed, but only abnormal results are displayed) Labs Reviewed  COMPREHENSIVE METABOLIC PANEL - Abnormal; Notable for the following components:      Result Value   Sodium 130 (*)    CO2 19 (*)    Glucose, Bld 255 (*)    BUN 50 (*)    Creatinine, Ser 4.47 (*)    Calcium 8.4 (*)    Albumin 3.4 (*)    GFR, Estimated 14 (*)    All other components within normal limits  CBC WITH DIFFERENTIAL/PLATELET - Abnormal; Notable for the following components:   RBC 4.09 (*)    Hemoglobin 12.0 (*)    HCT 34.8 (*)    Platelets 134 (*)    Lymphs Abs 0.4 (*)    All other components within normal limits  CULTURE, BLOOD (ROUTINE X 2)  CULTURE, BLOOD (ROUTINE X 2)  RESP PANEL BY RT-PCR (RSV, FLU A&B, COVID)  RVPGX2  RESP PANEL BY RT-PCR (RSV, FLU A&B, COVID)  RVPGX2  LACTIC ACID, PLASMA  PROTIME-INR  APTT    EKG None  Radiology DG Chest Port 1 View Result Date: 10/24/2023 CLINICAL DATA:  Sepsis, cough EXAM: PORTABLE CHEST 1 VIEW COMPARISON:  01/19/2022 FINDINGS: Cardiomegaly. Low lung volumes. Mild right basilar atelectasis. No pleural effusion or pneumothorax. IMPRESSION: Low lung volumes with mild right basilar atelectasis. Electronically Signed   By: Duanne Guess D.O.   On: 10/24/2023 10:55    Procedures Procedures    Medications Ordered in ED Medications  lactated ringers infusion ( Intravenous New Bag/Given 10/24/23 1248)  sodium chloride 0.9 % bolus 500 mL (has no administration in time range)  cefTRIAXone (ROCEPHIN) 2 g in sodium chloride 0.9 % 100 mL IVPB (0 g Intravenous Stopped 10/24/23 1229)  azithromycin (ZITHROMAX) 500 mg in sodium chloride 0.9 % 250 mL IVPB (0 mg Intravenous Stopped 10/24/23 1514)  acetaminophen  (TYLENOL) tablet 650 mg (650 mg Oral Given 10/24/23 1104)  ipratropium-albuterol (DUONEB) 0.5-2.5 (3) MG/3ML nebulizer solution 3 mL (3 mLs Nebulization Given 10/24/23 1105)    ED Course/ Medical Decision Making/ A&P                                 Medical Decision Making 68 year old male with past medical history of CHF, coronary artery disease, and hypertension presenting to the emergency department today with fever, cough, and shortness of breath.  The patient is tachycardic here on arrival.  I will initiate a sepsis workup.  Will also obtain RSV/COVID/flu  swab on the patient.  Will give the patient empiric Rocephin and azithromycin for potential community-acquired pneumonia.  I will give patient DuoNebs here.  Will give him Tylenol for his fever.  I will reevaluate for ultimate disposition.  The patient's labs are relatively reassuring.  His creatinine is very mildly elevated compared to his baseline but GFR is only a few points off.  He is given some IV fluids for this.  The patient's viral testing is pending at the time of signout.  I think that if he does have a viral etiology for his symptoms that he can potentially go home if he is not hypoxic with ambulation.  If there is no viral etiology and he is not hypoxic think they could potentially go home on oral antibiotics.  He will be reassessed after this comes back ultimate disposition.  Signed out to oncoming provider.  Amount and/or Complexity of Data Reviewed Labs: ordered. Radiology: ordered.  Risk OTC drugs. Prescription drug management.           Final Clinical Impression(s) / ED Diagnoses Final diagnoses:  Fever, unspecified fever cause    Rx / DC Orders ED Discharge Orders     None         Durwin Glaze, MD 10/24/23 1556

## 2023-10-24 NOTE — ED Provider Notes (Signed)
Patient was signed out pending testing for influenza and COVID, this patient has a history of some heart disease with coronary disease A-fib and CHF, presents with fever cough and some posttussive emesis.  This patient has no hypoxia, 94 to 96% on room air, temperature is 99.5 any test positive for the flu.  X-ray is negative for pneumonia  Patient informed of results, stable for discharge on Tamiflu and benzonatate   Eber Hong, MD 10/24/23 1714

## 2023-10-24 NOTE — ED Triage Notes (Signed)
Pt arrived via [facility] from home cough, N/V x 1 week. Per EMS, fever of 100 F, hypertensive and tachycardic.

## 2023-10-24 NOTE — Sepsis Progress Note (Addendum)
Elink following code sepsis   Code Sepsis monitoring discontinued due to discharge. @ 1743

## 2023-10-25 ENCOUNTER — Other Ambulatory Visit: Payer: Self-pay

## 2023-10-29 LAB — CULTURE, BLOOD (ROUTINE X 2)
Culture: NO GROWTH
Culture: NO GROWTH
Special Requests: ADEQUATE

## 2023-11-10 ENCOUNTER — Telehealth: Payer: Self-pay

## 2023-11-10 NOTE — Telephone Encounter (Signed)
 Patient was identified as falling into the [place] Measure - Diabetes.   Patient was: Appointment scheduled for lab or office visit for A1c.

## 2023-11-15 DIAGNOSIS — D631 Anemia in chronic kidney disease: Secondary | ICD-10-CM | POA: Diagnosis not present

## 2023-11-15 DIAGNOSIS — E875 Hyperkalemia: Secondary | ICD-10-CM | POA: Diagnosis not present

## 2023-11-15 DIAGNOSIS — N189 Chronic kidney disease, unspecified: Secondary | ICD-10-CM | POA: Diagnosis not present

## 2023-11-15 DIAGNOSIS — E1122 Type 2 diabetes mellitus with diabetic chronic kidney disease: Secondary | ICD-10-CM | POA: Diagnosis not present

## 2023-11-15 DIAGNOSIS — I5042 Chronic combined systolic (congestive) and diastolic (congestive) heart failure: Secondary | ICD-10-CM | POA: Diagnosis not present

## 2023-11-15 DIAGNOSIS — E785 Hyperlipidemia, unspecified: Secondary | ICD-10-CM | POA: Diagnosis not present

## 2023-11-15 DIAGNOSIS — R809 Proteinuria, unspecified: Secondary | ICD-10-CM | POA: Diagnosis not present

## 2023-11-15 DIAGNOSIS — N184 Chronic kidney disease, stage 4 (severe): Secondary | ICD-10-CM | POA: Diagnosis not present

## 2023-11-15 DIAGNOSIS — I129 Hypertensive chronic kidney disease with stage 1 through stage 4 chronic kidney disease, or unspecified chronic kidney disease: Secondary | ICD-10-CM | POA: Diagnosis not present

## 2023-11-17 ENCOUNTER — Telehealth (HOSPITAL_COMMUNITY): Payer: Self-pay

## 2023-11-17 NOTE — Telephone Encounter (Signed)
 Called to confirm/remind patient of their appointment at the [facility] on 11/18/23.   Patient reminded to bring all medications and/or complete list.  Confirmed patient has transportation. Gave directions, instructed to utilize valet parking.  Confirmed appointment prior to ending call.

## 2023-11-18 ENCOUNTER — Other Ambulatory Visit: Payer: Self-pay

## 2023-11-18 ENCOUNTER — Ambulatory Visit (HOSPITAL_COMMUNITY)
Admission: RE | Admit: 2023-11-18 | Discharge: 2023-11-18 | Disposition: A | Discharge status: Home / Self Care | Payer: Medicare Other | Source: Ambulatory Visit | Attending: Physician Assistant | Admitting: Physician Assistant

## 2023-11-18 VITALS — BP 180/100 | HR 100 | Wt 148.6 lb

## 2023-11-18 DIAGNOSIS — N184 Chronic kidney disease, stage 4 (severe): Secondary | ICD-10-CM | POA: Diagnosis not present

## 2023-11-18 DIAGNOSIS — I130 Hypertensive heart and chronic kidney disease with heart failure and stage 1 through stage 4 chronic kidney disease, or unspecified chronic kidney disease: Secondary | ICD-10-CM | POA: Insufficient documentation

## 2023-11-18 DIAGNOSIS — Z87891 Personal history of nicotine dependence: Secondary | ICD-10-CM | POA: Insufficient documentation

## 2023-11-18 DIAGNOSIS — I2510 Atherosclerotic heart disease of native coronary artery without angina pectoris: Secondary | ICD-10-CM | POA: Insufficient documentation

## 2023-11-18 DIAGNOSIS — I10 Essential (primary) hypertension: Secondary | ICD-10-CM

## 2023-11-18 DIAGNOSIS — Z955 Presence of coronary angioplasty implant and graft: Secondary | ICD-10-CM | POA: Diagnosis not present

## 2023-11-18 DIAGNOSIS — Z7901 Long term (current) use of anticoagulants: Secondary | ICD-10-CM | POA: Diagnosis not present

## 2023-11-18 DIAGNOSIS — Z794 Long term (current) use of insulin: Secondary | ICD-10-CM | POA: Insufficient documentation

## 2023-11-18 DIAGNOSIS — I480 Paroxysmal atrial fibrillation: Secondary | ICD-10-CM | POA: Diagnosis not present

## 2023-11-18 DIAGNOSIS — E1122 Type 2 diabetes mellitus with diabetic chronic kidney disease: Secondary | ICD-10-CM | POA: Insufficient documentation

## 2023-11-18 DIAGNOSIS — I252 Old myocardial infarction: Secondary | ICD-10-CM | POA: Diagnosis not present

## 2023-11-18 DIAGNOSIS — I5022 Chronic systolic (congestive) heart failure: Secondary | ICD-10-CM | POA: Diagnosis not present

## 2023-11-18 DIAGNOSIS — Z8673 Personal history of transient ischemic attack (TIA), and cerebral infarction without residual deficits: Secondary | ICD-10-CM | POA: Insufficient documentation

## 2023-11-18 DIAGNOSIS — E1110 Type 2 diabetes mellitus with ketoacidosis without coma: Secondary | ICD-10-CM | POA: Insufficient documentation

## 2023-11-18 DIAGNOSIS — Z79899 Other long term (current) drug therapy: Secondary | ICD-10-CM | POA: Diagnosis not present

## 2023-11-18 DIAGNOSIS — F1010 Alcohol abuse, uncomplicated: Secondary | ICD-10-CM | POA: Diagnosis not present

## 2023-11-18 DIAGNOSIS — I255 Ischemic cardiomyopathy: Secondary | ICD-10-CM | POA: Insufficient documentation

## 2023-11-18 MED ORDER — AMLODIPINE BESYLATE 2.5 MG PO TABS
2.5000 mg | ORAL_TABLET | Freq: Every day | ORAL | 3 refills | Status: DC
  Filled 2023-11-18: qty 90, 90d supply, fill #0

## 2023-11-18 NOTE — Progress Notes (Signed)
 Patient ID: Timothy Lambert, male   DOB: 01-02-1956, 68 y.o.   MRN: [id number]    [facility] Note   PCP: Marcine Matar, MD HF: Dr. Lajuana Ripple Mcfetridge is a 68 y.o. male with history of prior ETOH abuse, DM, HTN, CAD s/p anterior STEMI 5/16, HFrEF with recovered EF, paroxysmal atrial fibrillation, hx CVA, CKD IV, noncompliance.   Patient was admitted to [facility] in 5/16 with an ETOH withdrawal seizure.  He was also noted to have anterior ST elevation and was sent for cath.  This showed diffuse disease with culprit being 100% mLAD stenosis s/p DES. Echo showed EF 30-35%.  Post-PCI, he had atrial fibrillation and had a CVA.  He had no residual deficits from the CVA.     Repeat echo 8/16 showed improvement in EF to 50-55%.   Admitted 4/23 w/ acute CHF, suspected shock, PNA and DKA. Echo showed EF 40-45%. [place]nl [place] 138[address], suggestive of NSTEMI with ACS. No cath with lack of chest pain and AKI (SCr >4). Declined central access and blood products. In AF this admission and started on amiodarone. Palliative consulted, was DNR.   Echo 11/23 EF 40% with mild LV enlargement, mild LVH, global hypokinesis, normal RV.   He is here today for overdue CHF follow-up. Discharged by Paramedicine d/t inability to contact him. Seen in ED with influenza A eearlier this month and has taken a while to recover. He reports taking all medications as prescribed but has not taken them yet today. Denies dyspnea orthopnea or PND. Notes a little bit of lower extremity edema. Took PRN lasix a few days ago and plans to take it again today. He reports following a low sodium diet.  SH: Married and lives in [place]. He is Jehova's Witness. Prior heavy ETOH use (has quit), prior smoker (has quit).   FH: CAD  ROS: All systems reviewed and negative except as per HPI.   Current Outpatient Medications  Medication Sig Dispense Refill   acetaminophen (TYLENOL) 325 MG tablet Take 2 tablets  (650 mg total) by mouth every 6 (six) hours as needed for mild pain (or Fever >/= 101).     amLODipine (NORVASC) 2.5 MG tablet Take 1 tablet (2.5 mg total) by mouth daily. 90 tablet 3   apixaban (ELIQUIS) 5 MG TABS tablet Take 1 tablet (5 mg total) by mouth 2 (two) times daily. 180 tablet 1   atorvastatin (LIPITOR) 80 MG tablet Take 1 tablet (80 mg total) by mouth daily. 90 tablet 0   benzonatate (TESSALON) 100 MG capsule Take 1 capsule (100 mg total) by mouth every 8 (eight) hours. 21 capsule 0   carvedilol (COREG) 25 MG tablet Take 1 tablet (25 mg total) by mouth 2 (two) times daily. 60 tablet 11   furosemide (LASIX) 20 MG tablet Take 2 tablets (40 mg total) by mouth as needed. For swelling or weight gain of 3lb in 24 hours or 5lb in a week 180 tablet 1   glucose blood (ACCU-CHEK GUIDE) test strip Use as instructed 3 times daily DX code E11.9 100 each 3   insulin lispro (HUMALOG KWIKPEN) 100 UNIT/ML KwikPen Inject 2 Units into the skin 2 (two) times daily before lunch and supper. 15 mL 11   Insulin Pen Needle (PEN NEEDLES) 31G X 8 MM MISC use as directed 100 each 6   Insulin Syringe-Needle U-100 (INSULIN SYRINGE .5CC/30GX1/2") 30G X 1/2" 0.5 ML MISC Check blood sugar TID &  QHS 100 each 2   isosorbide-hydrALAZINE (BIDIL) 20-37.5 MG tablet Take 2 tablets by mouth 3 (three) times daily. 270 tablet 3   Lancets (ACCU-CHEK SOFT TOUCH) lancets Use as instructed 100 each 12   sodium zirconium cyclosilicate (LOKELMA) 5 g packet Mix 1 packet according to package directions and take by mouth as directed for 1 time dose. 1 packet 0   TRUEPLUS LANCETS 28G MISC 1 each by Does not apply route 3 (three) times daily before meals. 100 each 12   Continuous Glucose Receiver (DEXCOM G7 RECEIVER) DEVI Use to check blood sugar three times daily. Change sensors once every 10 days. E11.21 (Patient not taking: Reported on 11/18/2023) 1 each 0   Continuous Glucose Sensor (DEXCOM G7 SENSOR) MISC Use to check blood sugar three  times daily. Change sensors once every 10 days. E11.21 (Patient not taking: Reported on 11/18/2023) 3 each 6   insulin glargine (LANTUS SOLOSTAR) 100 UNIT/ML Solostar Pen Inject 16 Units into the skin daily. 15 mL 11   No current facility-administered medications for this encounter.   BP (!) 180/100 (BP Location: Right Arm, Patient Position: Sitting, Cuff Size: Normal)   Pulse 100   Wt 67.4 kg (148 lb 9.6 oz)   SpO2 99%   BMI 23.27 kg/m    Wt Readings from Last 3 Encounters:  11/18/23 67.4 kg (148 lb 9.6 oz)  09/17/23 69.4 kg (153 lb)  07/06/23 64.4 kg (142 lb)   General:  Well appearing Neck: no JVD.  Cor: Regular rate & rhythm. No rubs, gallops or murmurs. Lungs: clear Abdomen: soft, nontender, nondistended.  Extremities: 1-2+ edema Neuro: alert & orientedx3. Affect pleasant   Assessment/Plan: 1. Chronic systolic CHF: History of ischemic CMP in 2016 post-anterior MI that recovered over time.  Echo in 8/16 with EF 50-55%.  Echo 4/23 showed EF 40-45% with apical HK, mild LVH, normal RV, dilated IVC. NHYA II. Echo 11/23 showed EF 40% with mild LV enlargement, mild LVH, global hypokinesis, normal RV.   - NYHA II. Volume up on exam. He used 40 mg lasix po earlier this week and plans to take again this afternoon. BMET/BNP in 2 weeks. - Continue Coreg 25 mg bid - Continue Bidil 2 tabs tid - History of noncompliance with medications but reports he has been taking them as prescribed. Needs more aggressive BP reduction. Add amlodipine 2.5 mg daily. - No MRA/ARNi/SGLT2i with CKD stage IV. - Echo ordered to reassess LV function and EF  2. CAD: Anterior STEMI in 2016, DES to LAD.  NSTEMI 4/23. Had been off all meds x 1 month. ECG and elevate HS-Tnl suggestive of NSTEMI, but no cath due to lack of chest pain and creatinine up to > 4. This was in setting of DKA and hypertension.  No chest pain. - Would avoid cath in absence of STEMI given CKD stage IV.  - No ASA given Eliquis use.  - Continue  atorvastatin 80 mg daily.   3. CKD stage IV: Baseline diabetic nephropathy, suspect HTN plays a role.  - Patient reports he would not want HD  - As above, needs better blood pressure control. Added amlodipine as above - Follows with Nephrology, saw Dr. Glenna Fellows earlier this week. - Scr recently > 4 in setting of influenza. Recheck BMET in a couple of weeks.  4. DM2: H/o DKA.  He is on insulin. Per PCP.  Poor blood glucose control.   5. H/o ETOH abuse: He denies further use.   6. Atrial  fibrillation: Remote history of PAF with CVA.  He was not anticoagulated at that time due to ETOH abuse. Had atrial fibrillation during 4/23 admission. Converted to SR. Rhythm regular today. - He is off amiodarone, can stay off unless AF recurs.  - Continue Eliquis 5 mg bid.  Reports adherence.  7. Jehovah's Witness: no blood products.  8. Hypertension: Blood pressure elevated as above. He has been compliant with Coreg and Bidil.   - Meds as above - Discussed medication compliance.    Followup 6 months  [facility], Ezma Rehm N  11/18/23

## 2023-11-18 NOTE — Patient Instructions (Addendum)
 Medication Changes:  START: AMLODIPINE 2.5MG  ONCE DAILY   Lab Work:  PLEASE RETURN IN 2 WEEKS AS SCHEDULED FOR LAB WORK   Testing/Procedures:  ECHOCARDIOGRAM AS SCHEDULED   Follow-Up in: 6 MONTHS PLEASE CALL OUR OFFICE AROUND JUNE  TO GET SCHEDULED FOR YOUR APPOINTMENT. PHONE NUMBER IS [phone] OPTION 2   At the [facility], you and your health needs are our priority. We have a designated team specialized in the treatment of Heart Failure. This Care Team includes your primary Heart Failure Specialized Cardiologist (physician), Advanced Practice Providers (APPs- Physician Assistants and Nurse Practitioners), and Pharmacist who all work together to provide you with the care you need, when you need it.   You may see any of the following providers on your designated Care Team at your next follow up:  Dr. Arvilla Meres Dr. Marca Ancona Dr. Dorthula Nettles Dr. Theresia Bough Tonye Becket, NP Robbie Lis, [place] [facility] [place], [place] Brynda Peon, NP [place] Lee, NP Karle Plumber, PharmD   Please be sure to bring in all your medications bottles to every appointment.   Need to Contact us:  If you have any questions or concerns before your next appointment please send [place] a message through [place] or call our office at [phone].    TO LEAVE A MESSAGE FOR THE NURSE SELECT OPTION 2, PLEASE LEAVE A MESSAGE INCLUDING: YOUR NAME DATE OF BIRTH CALL BACK NUMBER REASON FOR CALL**this is important as we prioritize the call backs  YOU WILL RECEIVE A CALL BACK THE SAME DAY AS LONG AS YOU CALL BEFORE 4:00 PM

## 2023-11-22 ENCOUNTER — Encounter: Payer: Self-pay | Admitting: Pharmacist

## 2023-11-22 ENCOUNTER — Telehealth: Payer: Self-pay | Admitting: Pharmacist

## 2023-11-22 ENCOUNTER — Other Ambulatory Visit: Payer: Self-pay

## 2023-11-22 ENCOUNTER — Other Ambulatory Visit (HOSPITAL_BASED_OUTPATIENT_CLINIC_OR_DEPARTMENT_OTHER): Payer: Self-pay | Admitting: Pharmacist

## 2023-11-22 DIAGNOSIS — E785 Hyperlipidemia, unspecified: Secondary | ICD-10-CM

## 2023-11-22 DIAGNOSIS — E1169 Type 2 diabetes mellitus with other specified complication: Secondary | ICD-10-CM

## 2023-11-22 DIAGNOSIS — Z794 Long term (current) use of insulin: Secondary | ICD-10-CM

## 2023-11-22 NOTE — Progress Notes (Signed)
 Pharmacy TNM Diabetes Measure Review  S:  Patient was identified in a report as being at risk for failing the True [place] Metric of A1c control (<8%) in [place] and African American patients. Last A1c was 13.2 in December. Last PCP visit was 09/16/26.  Call placed to patient to discuss diabetes control and medication management. Patient has been seen by the clinical pharmacist with most recent visit 04/23/2023.  Current diabetes medications include: Lantus 16 units daily, Humalog 2u BID before meals  Patient reports adherence to taking all medications as prescribed.   Insurance coverage: [employer]. Pt was enrolled in MAP in 2024 but needs to re-enroll.   Patient denies hypoglycemic events.  Reported home fasting blood sugars: tells me he stays around 150 mg/dL but no specific readings given.   Patient reported dietary habits: none   Patient-reported exercise habits: none  O:   Lab Results  Component Value Date   HGBA1C 13.2 (A) 09/17/2023   There were no vitals filed for this visit.  Lipid Panel     Component Value Date/Time   CHOL 128 04/13/2023 0910   CHOL 122 09/05/2021 1129   TRIG 125 04/13/2023 0910   HDL 28 (L) 04/13/2023 0910   HDL 33 (L) 09/05/2021 1129   CHOLHDL 4.6 04/13/2023 0910   VLDL 25 04/13/2023 0910   LDLCALC 75 04/13/2023 0910   LDLCALC 68 09/05/2021 1129    Clinical Atherosclerotic Cardiovascular Disease (ASCVD): Yes  The ASCVD Risk score (Arnett DK, et al., 2019) failed to calculate for the following reasons:   Risk score cannot be calculated because patient has a medical history suggesting prior/existing ASCVD   Patient is participating in a Managed Medicaid Plan: No   A/P: Diabetes longstanding currently uncontrolled. He denies any symptoms at this time. He has a cognitive deficit S/p prior stroke. For this reason, I think he tends to forget instructions. I spoke with his wife today and she is amenable to coming with him to his appt with me  in 1 month. We will also start the process of MAP re-enrollment at this time. Medication adherence appears to be okay but I cannot see any recent pharmacy dispense hx as he gets his medication via manufacturer. -Continued current insulin regimen.  -No changes today until we can see him in-person.  -Review information with his wife who verbalizes understanding. She will be with him when he sees me in 1 month.  -Patient educated on purpose, proper use, and potential adverse effects of insulin.  -Extensively discussed pathophysiology of diabetes, recommended lifestyle interventions, dietary effects on blood sugar control.  -Counseled on s/sx of and management of hypoglycemia.  -Next A1c anticipated 12/2023.   Follow-up:  Pharmacist 12/23/2023.  Butch Penny, PharmD, Patsy Baltimore, CPP Clinical Pharmacist [facility] [phone]

## 2023-11-22 NOTE — Telephone Encounter (Signed)
 Opened in error

## 2023-12-03 ENCOUNTER — Telehealth (HOSPITAL_COMMUNITY): Payer: Self-pay

## 2023-12-03 ENCOUNTER — Ambulatory Visit (HOSPITAL_COMMUNITY)
Admission: RE | Admit: 2023-12-03 | Discharge: 2023-12-03 | Disposition: A | Discharge status: Home / Self Care | Payer: Medicare Other | Source: Ambulatory Visit | Attending: Cardiology | Admitting: Cardiology

## 2023-12-03 DIAGNOSIS — I5022 Chronic systolic (congestive) heart failure: Secondary | ICD-10-CM | POA: Diagnosis not present

## 2023-12-03 DIAGNOSIS — I080 Rheumatic disorders of both mitral and aortic valves: Secondary | ICD-10-CM | POA: Insufficient documentation

## 2023-12-03 DIAGNOSIS — I5042 Chronic combined systolic (congestive) and diastolic (congestive) heart failure: Secondary | ICD-10-CM

## 2023-12-03 LAB — BASIC METABOLIC PANEL
Anion gap: 9 (ref 5–15)
BUN: 52 mg/dL — ABNORMAL HIGH (ref 8–23)
CO2: 20 mmol/L — ABNORMAL LOW (ref 22–32)
Calcium: 9.5 mg/dL (ref 8.9–10.3)
Chloride: 110 mmol/L (ref 98–111)
Creatinine, Ser: 4.27 mg/dL — ABNORMAL HIGH (ref 0.61–1.24)
GFR, Estimated: 14 mL/min — ABNORMAL LOW (ref >60)
Glucose, Bld: 181 mg/dL — ABNORMAL HIGH (ref 70–99)
Potassium: 4.7 mmol/L (ref 3.5–5.1)
Sodium: 139 mmol/L (ref 135–145)

## 2023-12-03 LAB — ECHOCARDIOGRAM COMPLETE
AR max vel: 2.82 cm2
AV Peak grad: 3.7 mmHg
Ao pk vel: 0.97 m/s
Area-P 1/2: 6.27 cm2
Calc EF: 21.1 %
MV M vel: 4.77 m/s
MV Peak grad: 91 mmHg
S' Lateral: 5 cm
Single Plane A2C EF: 29.6 %
Single Plane A4C EF: 17.6 %

## 2023-12-03 LAB — BRAIN NATRIURETIC PEPTIDE: B Natriuretic Peptide: 2958.2 pg/mL — ABNORMAL HIGH (ref 0.0–100.0)

## 2023-12-03 NOTE — Progress Notes (Signed)
 Echocardiogram 2D Echocardiogram has been performed.  Timothy Lambert 12/03/2023, 11:07 AM

## 2023-12-03 NOTE — Telephone Encounter (Addendum)
 Called patient to give results and to get him a follow up appointment, while going over the results patient, patient hung up. Tried calling back but phone rang out.   ----- Message from [facility] [place] N sent at 12/03/2023  4:32 PM EDT ----- EF is lower, down from 40% to 20-25%. His BNP is much higher. Need to be sure he is taking all of his medications and arrange follow-up within next couple of weeks.

## 2023-12-06 NOTE — Telephone Encounter (Signed)
 Pt/pts wife returned call aware of results F/u made 4/2 @ 140

## 2023-12-12 ENCOUNTER — Telehealth: Payer: Self-pay | Admitting: Internal Medicine

## 2023-12-12 NOTE — Telephone Encounter (Signed)
 Pt Deceased. Phone call placed to patient's wife Timothy Lambert today to inquire about his manner of death so that I can complete his death certificate.  Ms. Schlarb told me that she left the house around 9 PM on Friday, March 21 to go to work.  She returned at 8:30 AM on 2025-03-27and found the patient lying on the floor that did.  She called EMS.  No CPR was initiated as patient was dead. I extended my condolences to her and the entire family.  She was able to give me the phone number for the funeral home was [phone].  The funeral home told me that he was pronounced by EMS at 9 AM on 2023/12/16. Death Certificate was completed.  Message sent to our RN to update status in EMR.

## 2023-12-21 DEATH — deceased

## 2023-12-22 ENCOUNTER — Encounter (HOSPITAL_COMMUNITY): Admitting: Cardiology

## 2023-12-23 ENCOUNTER — Ambulatory Visit: Admitting: Pharmacist

## 2024-01-17 ENCOUNTER — Ambulatory Visit: Payer: Medicare Other | Admitting: Internal Medicine
